# Patient Record
Sex: Female | Born: 1950
Health system: Southern US, Community
[De-identification: ages and names within clinical notes are randomized; demographics above are authoritative.]

## PROBLEM LIST (undated history)

## (undated) DIAGNOSIS — Z9484 Stem cells transplant status: Secondary | ICD-10-CM

## (undated) DIAGNOSIS — J069 Acute upper respiratory infection, unspecified: Secondary | ICD-10-CM

## (undated) DIAGNOSIS — I341 Nonrheumatic mitral (valve) prolapse: Secondary | ICD-10-CM

## (undated) DIAGNOSIS — C9000 Multiple myeloma not having achieved remission: Secondary | ICD-10-CM

## (undated) DIAGNOSIS — K5909 Other constipation: Secondary | ICD-10-CM

## (undated) DIAGNOSIS — N83201 Unspecified ovarian cyst, right side: Secondary | ICD-10-CM

## (undated) HISTORY — DX: Acute upper respiratory infection, unspecified: J06.9

## (undated) HISTORY — DX: Stem cells transplant status: Z94.84

## (undated) HISTORY — DX: Unspecified ovarian cyst, right side: N83.201

## (undated) HISTORY — DX: Other constipation: K59.09

## (undated) HISTORY — DX: Multiple myeloma not having achieved remission: C90.00

## (undated) HISTORY — DX: Nonrheumatic mitral (valve) prolapse: I34.1

---

## 1988-08-09 HISTORY — PX: LASER ABLATION OF CONDYLOMAS: SHX1948

## 1999-02-19 ENCOUNTER — Other Ambulatory Visit: Admission: RE | Admit: 1999-02-19 | Discharge: 1999-02-19 | Payer: Self-pay | Admitting: Obstetrics and Gynecology

## 1999-09-24 ENCOUNTER — Other Ambulatory Visit: Admission: RE | Admit: 1999-09-24 | Discharge: 1999-09-24 | Payer: Self-pay | Admitting: Obstetrics and Gynecology

## 2000-10-07 ENCOUNTER — Other Ambulatory Visit: Admission: RE | Admit: 2000-10-07 | Discharge: 2000-10-07 | Payer: Self-pay | Admitting: Obstetrics and Gynecology

## 2001-11-06 ENCOUNTER — Other Ambulatory Visit: Admission: RE | Admit: 2001-11-06 | Discharge: 2001-11-06 | Payer: Self-pay | Admitting: Obstetrics and Gynecology

## 2002-11-08 ENCOUNTER — Other Ambulatory Visit: Admission: RE | Admit: 2002-11-08 | Discharge: 2002-11-08 | Payer: Self-pay | Admitting: Obstetrics and Gynecology

## 2003-03-06 ENCOUNTER — Encounter: Payer: Self-pay | Admitting: Internal Medicine

## 2003-03-06 ENCOUNTER — Encounter: Admission: RE | Admit: 2003-03-06 | Discharge: 2003-03-06 | Payer: Self-pay | Admitting: Internal Medicine

## 2003-04-10 ENCOUNTER — Encounter (INDEPENDENT_AMBULATORY_CARE_PROVIDER_SITE_OTHER): Payer: Self-pay | Admitting: Specialist

## 2003-04-10 ENCOUNTER — Ambulatory Visit (HOSPITAL_COMMUNITY): Admission: RE | Admit: 2003-04-10 | Discharge: 2003-04-10 | Payer: Self-pay | Admitting: Gastroenterology

## 2003-12-13 ENCOUNTER — Encounter: Admission: RE | Admit: 2003-12-13 | Discharge: 2003-12-13 | Payer: Self-pay | Admitting: Internal Medicine

## 2003-12-17 ENCOUNTER — Other Ambulatory Visit: Admission: RE | Admit: 2003-12-17 | Discharge: 2003-12-17 | Payer: Self-pay | Admitting: Obstetrics and Gynecology

## 2005-01-06 ENCOUNTER — Other Ambulatory Visit: Admission: RE | Admit: 2005-01-06 | Discharge: 2005-01-06 | Payer: Self-pay | Admitting: Obstetrics and Gynecology

## 2006-01-12 ENCOUNTER — Other Ambulatory Visit: Admission: RE | Admit: 2006-01-12 | Discharge: 2006-01-12 | Payer: Self-pay | Admitting: Obstetrics & Gynecology

## 2007-01-23 ENCOUNTER — Other Ambulatory Visit: Admission: RE | Admit: 2007-01-23 | Discharge: 2007-01-23 | Payer: Self-pay | Admitting: Obstetrics and Gynecology

## 2008-01-26 ENCOUNTER — Other Ambulatory Visit: Admission: RE | Admit: 2008-01-26 | Discharge: 2008-01-26 | Payer: Self-pay | Admitting: Obstetrics and Gynecology

## 2010-10-08 HISTORY — PX: CATARACT EXTRACTION, BILATERAL: SHX1313

## 2010-12-25 NOTE — Op Note (Signed)
NAME:  Jessica [name], Jessica [name]                          ACCOUNT NO.:  [id number]   MEDICAL RECORD NO.:  [id number]                   PATIENT TYPE:  AMB   LOCATION:  ENDO                                 FACILITY:  [facility]   PHYSICIAN:  Danise Edge, M.D.                DATE OF BIRTH:  [birth year]   DATE OF PROCEDURE:  04/10/2003  DATE OF DISCHARGE:                                 OPERATIVE REPORT   PROCEDURES:  1. Esophagogastroduodenoscopy.  2. Colonoscopy.  3. Cecal polypectomy.   PROCEDURE INDICATION:  [name] is a 60 year old female, born 10/13/1950.  Ms. Livingstone underwent an upper GI x-ray series which revealed a  possible small polyp in the fundus of her stomach.  She is also due for her  first screening colonoscopy with polypectomy to prevent colon cancer.   ENDOSCOPIST:  Danise Edge, M.D.   PREMEDICATION:  1. Versed 10 mg.  2. Demerol 70 mg.   DIAGNOSTIC ESOPHAGOGASTRODUODENOSCOPY:  After obtaining informed consent,  [name] was placed in the left lateral decubitus position.  I administered  intravenous Demerol and intravenous Versed to achieve conscious sedation for  the procedure.  The patient's blood pressure, oxygen saturation, and cardiac  rhythm were monitored throughout the procedure and documented in the medical  record.   The Olympus gastroscope was passed through the posterior hypopharynx into  the proximal esophagus without difficulty.  The hypopharynx, larynx, and  vocal cords appeared normal.   ESOPHAGOSCOPY:  The proximal, mid, and lower segments of the esophageal  mucosa appear normal endoscopically.   GASTROSCOPY:  Retroflexed view of the gastric cardia and fundus was  completely normal.  Close inspection does not disclose a polyp at the  esophagogastric junction, in the gastric cardia, or in the gastric fundus.  The gastric body, antrum, and pylorus appear completely normal.   DUODENOSCOPY:  The duodenal bulb and descending duodenum appear  normal.   ASSESSMENT:  Normal esophagogastroduodenoscopy.   PROCTOCOLONOSCOPY WITH POLYPECTOMY:  Anal inspection was normal.  Digital  rectal exam was normal.  The Olympus pediatric adjustable colonoscope was  introduced into the rectum and advanced to the cecum.  Colonic preparation  for the exam today was excellent.   RECTUM:  Normal.  SIGMOID COLON AND DESCENDING COLON:  Normal.  SPLENIC FLEXURE:  Normal.  TRANSVERSE COLON:  Normal.  HEPATIC FLEXURE:  Normal.  ASCENDING COLON:  Normal.  CECUM AND ILEOCECAL VALVE:  From the proximal cecum adjacent to the  appendiceal orifice, there is a carpet-like 1 cm polyp.  The polyp was  lifted by saline mucosal injection and removed with the electrocautery snare  in piecemeal fashion.  Pieces of the polyp were retrieved for pathologic  evaluation.   ASSESSMENT:  A 1 cm flat polyp was removed from the proximal cecum adjacent  to the appendiceal orifice; otherwise normal proctocolonoscopy to the cecum.  Danise Edge, M.D.    MJ/MEDQ  D:  04/10/2003  T:  04/10/2003  Job:  [id number]

## 2013-03-02 ENCOUNTER — Ambulatory Visit: Payer: Self-pay | Admitting: Obstetrics and Gynecology

## 2013-03-19 ENCOUNTER — Encounter: Payer: Self-pay | Admitting: Obstetrics and Gynecology

## 2013-03-19 ENCOUNTER — Ambulatory Visit (INDEPENDENT_AMBULATORY_CARE_PROVIDER_SITE_OTHER): Payer: 59 | Admitting: Obstetrics and Gynecology

## 2013-03-19 ENCOUNTER — Ambulatory Visit: Payer: Self-pay | Admitting: Obstetrics and Gynecology

## 2013-03-19 VITALS — BP 152/90 | HR 84 | Resp 18 | Ht 61.0 in | Wt 135.0 lb

## 2013-03-19 DIAGNOSIS — Z0000 Encounter for general adult medical examination without abnormal findings: Secondary | ICD-10-CM

## 2013-03-19 DIAGNOSIS — Z01419 Encounter for gynecological examination (general) (routine) without abnormal findings: Secondary | ICD-10-CM

## 2013-03-19 LAB — POCT URINALYSIS DIPSTICK
Bilirubin, UA: NEGATIVE
Blood, UA: NEGATIVE
Glucose, UA: NEGATIVE
Ketones, UA: NEGATIVE
Leukocytes, UA: NEGATIVE
Nitrite, UA: NEGATIVE
Protein, UA: NEGATIVE
Urobilinogen, UA: NEGATIVE
pH, UA: 5

## 2013-03-19 LAB — HEMOGLOBIN, FINGERSTICK: Hemoglobin, fingerstick: 13.6 g/dL (ref 12.0–16.0)

## 2013-03-19 NOTE — Progress Notes (Signed)
03/12/13 normal62 y.o.   Married    Caucasian   female   G1P1   here for annual exam.    Patient's last menstrual period was 08/10/2003.          Sexually active: no  The current method of family planning is vasectomy and post menopausal status.    Exercising: occ walking Last mammogram: 03/12/13 normal  Last pap smear:02/19/11 History of abnormal pap: 1990 CIN 1 (cervix) Smoking: no Alcohol:no Last colonoscopy:07/2011 normal, repeat in 5 years Last Bone Density:  02/27/09 normal Last tetanus shot: 2007 Last cholesterol check: no sure  Hgb:  13.6              Urine: neg   Family History  Problem Relation Age of Onset  . Osteoporosis Mother     There are no active problems to display for this patient.   Past Medical History  Diagnosis Date  . MVP (mitral valve prolapse)     req prophylaxis    Past Surgical History  Procedure Laterality Date  . Laser ablation of condylomas  1990  . Cataract extraction, bilateral  10/2010    Implants(ReSTOR) Bilat    Allergies: Review of patient's allergies indicates no known allergies.  Current Outpatient Prescriptions  Medication Sig Dispense Refill  . Calcium Carbonate-Vit D-Min (CALTRATE 600+D PLUS PO) Take by mouth 2 (two) times daily.      . Multiple Vitamins-Minerals (CENTRUM SILVER PO) Take by mouth daily.      . Vitamin D, Ergocalciferol, (DRISDOL) 50000 UNITS CAPS capsule Take 50,000 Units by mouth every 14 (fourteen) days.       No current facility-administered medications for this visit.    ROS: Pertinent items are noted in HPI.  Social Hx: Married, one child, retired from working at [employer] which got bought out  Exam:    BP 152/90  Pulse 84  Resp 18  Ht 5\' 1"  (1.549 m)  Wt 135 lb (61.236 kg)  BMI 25.52 kg/m2  LMP 01/01/2005ht stable and wt up 6 pounds from last year   Wt Readings from Last 3 Encounters:  03/19/13 135 lb (61.236 kg)     Ht Readings from Last 3 Encounters:  03/19/13 5\' 1"  (1.549 m)     General appearance: alert, cooperative and appears stated age Head: Normocephalic, without obvious abnormality, atraumatic Neck: no adenopathy, supple, symmetrical, trachea midline and thyroid not enlarged, symmetric, no tenderness/mass/nodules Lungs: clear to auscultation bilaterally Breasts: Inspection negative, No nipple retraction or dimpling, No nipple discharge or bleeding, No axillary or supraclavicular adenopathy, Normal to palpation without dominant masses Heart: regular rate and rhythm Abdomen: soft, non-tender; bowel sounds normal; no masses,  no organomegaly Extremities: extremities normal, atraumatic, no cyanosis or edema Skin: Skin color, texture, turgor normal. No rashes or lesions Lymph nodes: Cervical, supraclavicular, and axillary nodes normal. No abnormal inguinal nodes palpated Neurologic: Grossly normal   Pelvic: External genitalia:  no lesions              Urethra:  normal appearing urethra with no masses, tenderness or lesions              Bartholins and Skenes: normal                 Vagina: normal appearing vagina with normal color and discharge, no lesions              Cervix: normal appearance  Pap taken: no        Bimanual Exam:  Uterus:  uterus is normal size, shape, consistency and nontender, mid, mobile                                      Adnexa: normal adnexa in size, nontender and no masses                                      Rectovaginal: Confirms                                      Anus:  normal sphincter tone, no lesions  A: normal menopausal exam, no HRT     P:     mammogram counseled on breast self exam, mammography screening, adequate intake of calcium and vitamin D, diet and exercise return annually or prn   Monitor BP at home  (pt has a cuff at home) and report it to Dr. Earl Gala   An After Visit Summary was printed and given to the patient.

## 2013-03-19 NOTE — Patient Instructions (Signed)

## 2013-04-03 ENCOUNTER — Telehealth: Payer: Self-pay | Admitting: Obstetrics and Gynecology

## 2013-04-03 NOTE — Telephone Encounter (Signed)
She should take 2000 U daily OTC Vit D.  Thanks.

## 2013-04-03 NOTE — Telephone Encounter (Signed)
Pt's last Vit D level checked 02-2010 was 51. None on file or in Epic since. Pt has been taking Vit D 50000 every other week. Does she need a Vit D level now and then decide whether to use Rx or OTC dose? Chart in your cabinet.

## 2013-04-03 NOTE — Telephone Encounter (Signed)
Patient has been taking Vit D 2 , 2 a month . Prescription has ran out does she  need to continue taking/ if so can you please call in a refill.

## 2013-04-03 NOTE — Telephone Encounter (Signed)
Spoke with pt and advised she may take OTC dose 2000 IU of Vit D daily. No need for Rx for larger dose. Pt appreciative.

## 2013-09-27 ENCOUNTER — Other Ambulatory Visit: Payer: Self-pay | Admitting: Dermatology

## 2013-11-22 ENCOUNTER — Other Ambulatory Visit: Payer: Self-pay | Admitting: Dermatology

## 2014-03-25 ENCOUNTER — Ambulatory Visit (INDEPENDENT_AMBULATORY_CARE_PROVIDER_SITE_OTHER): Payer: 59 | Admitting: Obstetrics and Gynecology

## 2014-03-25 ENCOUNTER — Encounter: Payer: Self-pay | Admitting: Obstetrics and Gynecology

## 2014-03-25 VITALS — BP 150/96 | HR 100 | Resp 18 | Ht 61.0 in | Wt 136.0 lb

## 2014-03-25 DIAGNOSIS — Z01419 Encounter for gynecological examination (general) (routine) without abnormal findings: Secondary | ICD-10-CM

## 2014-03-25 NOTE — Patient Instructions (Signed)

## 2014-03-25 NOTE — Progress Notes (Signed)
Patient ID: Jessica [name], female   DOB: [birth year], 63 y.o.   MRN: [id number] GYNECOLOGY VISIT  PCP:   Nehemiah Settle, MD  Referring provider:   HPI: 63 y.o.   Married  Caucasian  female   G1P1 with Patient's last menstrual period was 08/10/2003.   here for  AEX.   Will see Dr. Maxwell Caul tomorrow.  Has been having some back pain.  Will do labs there.  Elevated blood pressure today.  States she has white coat syndrome.  Takes blood pressure at home, where it is 130s over 70s.  Can vary.  Will bring readings to PCP tomorrow.   Hgb:    PCP Urine:  PCP  GYNECOLOGIC HISTORY: Patient's last menstrual period was 08/10/2003. Sexually active:  Yes, Occasionally Partner preference: female Contraception:  Vasectomy/postmenopausal  Menopausal hormone therapy: no DES exposure:   no Blood transfusions: no   Sexually transmitted diseases:  no  GYN procedures and prior surgeries:  Laser of cervix for abnormal pap and condyloma 1990 Last mammogram:  03-18-14 ERX:[id number]               Last pap and high risk HPV testing: 02-19-11 wnl   History of abnormal pap smear:  1990 CIN I cervix and had Laser   OB History   Grav Para Term Preterm Abortions TAB SAB Ect Mult Living   1 1        1        LIFESTYLE: Exercise: no               OTHER HEALTH MAINTENANCE: Tetanus/TDap:  Up to date with PCP HPV:                  N/a Influenza:          05/2013   Bone density:   02-27-09 normal Colonoscopy:  07/2011 normal with Dr. Earle Gell.  Next one due 07/2016.  Cholesterol check: several years QPY:[id number]  Family History  Problem Relation Age of Onset  . Osteoporosis Mother     There are no active problems to display for this patient.  Past Medical History  Diagnosis Date  . MVP (mitral valve prolapse)     req prophylaxis    Past Surgical History  Procedure Laterality Date  . Laser ablation of condylomas  1990    CIN 1 cervix  . Cataract extraction, bilateral  10/2010     Implants(ReSTOR) Bilat    ALLERGIES: Codeine  Current Outpatient Prescriptions  Medication Sig Dispense Refill  . Calcium Carbonate-Vit D-Min (CALTRATE 600+D PLUS PO) Take by mouth 2 (two) times daily.      . Cholecalciferol (VITAMIN D3) 2000 UNITS TABS Take 2,000 Units by mouth every 14 (fourteen) days.      . Multiple Vitamins-Minerals (CENTRUM SILVER PO) Take by mouth daily.       No current facility-administered medications for this visit.     ROS:  Pertinent items are noted in HPI.  History   Social History  . Marital Status: Married    Spouse Name: N/A    Number of Children: N/A  . Years of Education: N/A   Occupational History  . Not on file.   Social History Main Topics  . Smoking status: Never Smoker   . Smokeless tobacco: Never Used  . Alcohol Use: No  . Drug Use: No  . Sexual Activity: Not Currently    Partners: Male    Birth Control/ Protection: Post-menopausal  Comment: vasectomy   Other Topics Concern  . Not on file   Social History Narrative  . No narrative on file    PHYSICAL EXAMINATION:    BP 150/96  Pulse 100  Resp 18  Ht 5\' 1"  (1.549 m)  Wt 136 lb (61.689 kg)  BMI 25.71 kg/m2  LMP 08/10/2003   Wt Readings from Last 3 Encounters:  03/25/14 136 lb (61.689 kg)  03/19/13 135 lb (61.236 kg)     Ht Readings from Last 3 Encounters:  03/25/14 5\' 1"  (1.549 m)  03/19/13 5\' 1"  (1.549 m)    General appearance: alert, cooperative and appears stated age Head: Normocephalic, without obvious abnormality, atraumatic Neck: no adenopathy, supple, symmetrical, trachea midline and thyroid not enlarged, symmetric, no tenderness/mass/nodules Lungs: clear to auscultation bilaterally Breasts: Inspection negative, No nipple retraction or dimpling, No nipple discharge or bleeding, No axillary or supraclavicular adenopathy, Normal to palpation without dominant masses Heart: regular rate and rhythm Abdomen: soft, non-tender; no masses,  no  organomegaly Extremities: extremities normal, atraumatic, no cyanosis or edema Skin: Skin color, texture, turgor normal. No rashes or lesions Lymph nodes: Cervical, supraclavicular, and axillary nodes normal. No abnormal inguinal nodes palpated Neurologic: Grossly normal  Pelvic: External genitalia:  no lesions              Urethra:  normal appearing urethra with no masses, tenderness or lesions              Bartholins and Skenes: normal                 Vagina: normal appearing vagina with normal color and discharge, no lesions              Cervix: normal appearance              Pap and high risk HPV testing done: Yes.          Bimanual Exam:  Uterus:  uterus is normal size, shape, consistency and nontender                                      Adnexa: normal adnexa in size, nontender and no masses                                      Rectovaginal:  Yes.                                        Confirms above.                                      Anus:  normal sphincter tone, no lesions  ASSESSMENT  Normal gynecologic exam. Elevated blood pressure.   PLAN  Mammogram recommended yearly starting at age 70. Pap smear and high risk HPV testing as above. Counseled on self breast exam, Calcium and vitamin D intake, exercise. See lab orders: No. Follow up with PCP for elevated blood pressure and routine lab work to be done.  Return annually or prn   An After Visit Summary was printed and given to the patient.

## 2014-03-27 LAB — IPS PAP TEST WITH HPV

## 2014-06-05 ENCOUNTER — Other Ambulatory Visit (HOSPITAL_COMMUNITY): Payer: Self-pay | Admitting: Internal Medicine

## 2014-06-05 DIAGNOSIS — H539 Unspecified visual disturbance: Secondary | ICD-10-CM

## 2014-06-06 ENCOUNTER — Ambulatory Visit (HOSPITAL_COMMUNITY)
Admission: RE | Admit: 2014-06-06 | Discharge: 2014-06-06 | Disposition: A | Discharge status: Home / Self Care | Payer: 59 | Source: Ambulatory Visit | Attending: Internal Medicine | Admitting: Internal Medicine

## 2014-06-06 DIAGNOSIS — H539 Unspecified visual disturbance: Secondary | ICD-10-CM

## 2014-06-06 DIAGNOSIS — H532 Diplopia: Secondary | ICD-10-CM | POA: Diagnosis not present

## 2014-06-06 DIAGNOSIS — R51 Headache: Secondary | ICD-10-CM | POA: Diagnosis not present

## 2014-06-07 ENCOUNTER — Other Ambulatory Visit: Payer: Self-pay | Admitting: Internal Medicine

## 2014-06-07 DIAGNOSIS — G939 Disorder of brain, unspecified: Secondary | ICD-10-CM

## 2014-06-10 ENCOUNTER — Ambulatory Visit
Admission: RE | Admit: 2014-06-10 | Discharge: 2014-06-10 | Disposition: A | Discharge status: Home / Self Care | Payer: 59 | Source: Ambulatory Visit | Attending: Internal Medicine | Admitting: Internal Medicine

## 2014-06-10 ENCOUNTER — Encounter: Payer: Self-pay | Admitting: Obstetrics and Gynecology

## 2014-06-10 DIAGNOSIS — G939 Disorder of brain, unspecified: Secondary | ICD-10-CM

## 2014-06-10 MED ORDER — GADOBENATE DIMEGLUMINE 529 MG/ML IV SOLN
12.0000 mL | Freq: Once | INTRAVENOUS | Status: AC | PRN
  Administered 2014-06-10: 12 mL via INTRAVENOUS

## 2014-06-19 ENCOUNTER — Other Ambulatory Visit: Payer: Self-pay | Admitting: Neurosurgery

## 2014-06-19 DIAGNOSIS — D329 Benign neoplasm of meninges, unspecified: Secondary | ICD-10-CM

## 2014-06-20 ENCOUNTER — Ambulatory Visit
Admission: RE | Admit: 2014-06-20 | Discharge: 2014-06-20 | Disposition: A | Discharge status: Home / Self Care | Payer: 59 | Source: Ambulatory Visit | Attending: Neurosurgery | Admitting: Neurosurgery

## 2014-06-20 DIAGNOSIS — D329 Benign neoplasm of meninges, unspecified: Secondary | ICD-10-CM

## 2014-08-09 HISTORY — PX: OTHER SURGICAL HISTORY: SHX169

## 2014-11-14 ENCOUNTER — Telehealth: Payer: Self-pay | Admitting: Hematology and Oncology

## 2014-11-14 NOTE — Telephone Encounter (Signed)
S/W PATIENT AND GAVE NP APPT FOR 04/12 @ 12 W/DR. GORSUCH, CHEMO EDU @ 9:30

## 2014-11-18 ENCOUNTER — Other Ambulatory Visit: Payer: Self-pay | Admitting: *Deleted

## 2014-11-19 ENCOUNTER — Telehealth: Payer: Self-pay | Admitting: Hematology and Oncology

## 2014-11-19 ENCOUNTER — Encounter: Payer: Self-pay | Admitting: Hematology and Oncology

## 2014-11-19 ENCOUNTER — Ambulatory Visit (HOSPITAL_BASED_OUTPATIENT_CLINIC_OR_DEPARTMENT_OTHER): Payer: Medicare Other | Admitting: Hematology and Oncology

## 2014-11-19 ENCOUNTER — Ambulatory Visit: Payer: Medicare Other

## 2014-11-19 ENCOUNTER — Encounter: Payer: Self-pay | Admitting: *Deleted

## 2014-11-19 ENCOUNTER — Other Ambulatory Visit: Payer: Self-pay | Admitting: Hematology and Oncology

## 2014-11-19 ENCOUNTER — Other Ambulatory Visit: Payer: Medicare Other

## 2014-11-19 ENCOUNTER — Other Ambulatory Visit (HOSPITAL_BASED_OUTPATIENT_CLINIC_OR_DEPARTMENT_OTHER): Payer: Medicare Other

## 2014-11-19 ENCOUNTER — Telehealth: Payer: Self-pay | Admitting: *Deleted

## 2014-11-19 VITALS — BP 101/56 | HR 97 | Temp 98.5°F | Resp 18 | Ht 61.0 in | Wt 130.6 lb

## 2014-11-19 DIAGNOSIS — C9000 Multiple myeloma not having achieved remission: Secondary | ICD-10-CM | POA: Diagnosis not present

## 2014-11-19 DIAGNOSIS — N179 Acute kidney failure, unspecified: Secondary | ICD-10-CM

## 2014-11-19 DIAGNOSIS — G5792 Unspecified mononeuropathy of left lower limb: Secondary | ICD-10-CM

## 2014-11-19 DIAGNOSIS — C9002 Multiple myeloma in relapse: Secondary | ICD-10-CM | POA: Insufficient documentation

## 2014-11-19 DIAGNOSIS — E2749 Other adrenocortical insufficiency: Secondary | ICD-10-CM

## 2014-11-19 DIAGNOSIS — N19 Unspecified kidney failure: Secondary | ICD-10-CM | POA: Insufficient documentation

## 2014-11-19 DIAGNOSIS — M792 Neuralgia and neuritis, unspecified: Secondary | ICD-10-CM | POA: Insufficient documentation

## 2014-11-19 DIAGNOSIS — R1111 Vomiting without nausea: Secondary | ICD-10-CM

## 2014-11-19 DIAGNOSIS — C9001 Multiple myeloma in remission: Secondary | ICD-10-CM | POA: Insufficient documentation

## 2014-11-19 DIAGNOSIS — T380X5A Adverse effect of glucocorticoids and synthetic analogues, initial encounter: Secondary | ICD-10-CM

## 2014-11-19 HISTORY — DX: Multiple myeloma not having achieved remission: C90.00

## 2014-11-19 LAB — CBC WITH DIFFERENTIAL/PLATELET
BASO%: 0.5 % (ref 0.0–2.0)
Basophils Absolute: 0 10*3/uL (ref 0.0–0.1)
EOS%: 0.3 % (ref 0.0–7.0)
Eosinophils Absolute: 0 10*3/uL (ref 0.0–0.5)
HCT: 34.6 % — ABNORMAL LOW (ref 34.8–46.6)
HGB: 11.2 g/dL — ABNORMAL LOW (ref 11.6–15.9)
LYMPH%: 22.9 % (ref 14.0–49.7)
MCH: 29.3 pg (ref 25.1–34.0)
MCHC: 32.4 g/dL (ref 31.5–36.0)
MCV: 90.6 fL (ref 79.5–101.0)
MONO#: 0.6 10*3/uL (ref 0.1–0.9)
MONO%: 10.8 % (ref 0.0–14.0)
NEUT#: 3.8 10*3/uL (ref 1.5–6.5)
NEUT%: 65.5 % (ref 38.4–76.8)
Platelets: 332 10*3/uL (ref 145–400)
RBC: 3.82 10*6/uL (ref 3.70–5.45)
RDW: 13.4 % (ref 11.2–14.5)
WBC: 5.9 10*3/uL (ref 3.9–10.3)
lymph#: 1.3 10*3/uL (ref 0.9–3.3)

## 2014-11-19 LAB — LACTATE DEHYDROGENASE (CC13): LDH: 213 U/L (ref 125–245)

## 2014-11-19 LAB — COMPREHENSIVE METABOLIC PANEL (CC13)
ALT: 48 U/L (ref 0–55)
AST: 44 U/L — ABNORMAL HIGH (ref 5–34)
Albumin: 3.6 g/dL (ref 3.5–5.0)
Alkaline Phosphatase: 155 U/L — ABNORMAL HIGH (ref 40–150)
Anion Gap: 6 mEq/L (ref 3–11)
BUN: 11.6 mg/dL (ref 7.0–26.0)
CO2: 27 mEq/L (ref 22–29)
Calcium: 10.1 mg/dL (ref 8.4–10.4)
Chloride: 105 mEq/L (ref 98–109)
Creatinine: 1.5 mg/dL — ABNORMAL HIGH (ref 0.6–1.1)
EGFR: 37 mL/min/{1.73_m2} — ABNORMAL LOW (ref >90)
Glucose: 100 mg/dl (ref 70–140)
Potassium: 3.8 mEq/L (ref 3.5–5.1)
Sodium: 139 mEq/L (ref 136–145)
Total Bilirubin: 0.35 mg/dL (ref 0.20–1.20)
Total Protein: 8.2 g/dL (ref 6.4–8.3)

## 2014-11-19 LAB — URIC ACID (CC13): Uric Acid, Serum: 9.2 mg/dl — ABNORMAL HIGH (ref 2.6–7.4)

## 2014-11-19 MED ORDER — ACYCLOVIR 400 MG PO TABS: 400.0000 mg | ORAL_TABLET | Freq: Two times a day (BID) | ORAL | Status: DC

## 2014-11-19 MED ORDER — MORPHINE SULFATE ER 15 MG PO TBCR: 15.0000 mg | EXTENDED_RELEASE_TABLET | Freq: Two times a day (BID) | ORAL | Status: DC

## 2014-11-19 MED ORDER — OXYCODONE HCL 5 MG PO CAPS: 5.0000 mg | ORAL_CAPSULE | ORAL | Status: DC | PRN

## 2014-11-19 MED ORDER — DEXAMETHASONE 4 MG PO TABS: 20.0000 mg | ORAL_TABLET | ORAL | Status: DC

## 2014-11-19 MED ORDER — PROCHLORPERAZINE MALEATE 10 MG PO TABS: 10.0000 mg | ORAL_TABLET | Freq: Four times a day (QID) | ORAL | Status: DC | PRN

## 2014-11-19 NOTE — Progress Notes (Signed)
[facility] NOTE  Patient Care Team: Carlena Sax, MD as PCP - General (Internal Medicine)  CHIEF COMPLAINTS/PURPOSE OF CONSULTATION:  Multiple myeloma, IgG subtype  HISTORY OF PRESENTING ILLNESS:  Jessica [name] 64 y.o. female is here because of recent diagnosis of multiple myeloma. I reviewed her records extensively and color related the history with the patient. The symptoms began with inability to open her right eye. She has extensive evaluation and underwent surgery and was subsequently diagnosed with plasmacytoma. She was treated at [facility]. She had received radiation therapy to the right sphenoid area and her femur to prevent pathological fracture. The patient was successfully taper of dexamethasone a month ago. She was noted to have rising M spike and decision is to start her on treatment. PET CT scan in the outside facility show multiple bone lesions.  Oncology History    M-protein 0.69 gm/dl IFIX - IgG, Kappa IgG - 868 IgA - 19 IgM - < 20 Kappa - 21 Lambda - 5.7  09/06/2014 - Bone marrow aspirate and biopsy:   Normocellular marrow for age (40%) with a small monoclonal plasma cell population (1% on aspirate). Karyotype 22, XX  FISH Negative for myeloma associated changes  09/12/2014 - PET/CT  Two regions that are concerning for disease, one adjacent/involving the left ninth rib and one in the marrow of the right femur, in this patient with history of plasmacytoma.      Multiple myeloma   06/10/2014 Imaging MRI brain showed tumor filling the cavernous sinus on the right measuring approximately 2.6 x 1.4 x 1.9 cm, most consistent with meningioma.There is encasement of the internal carotid artery, extension into the orbital apex, medial sella, and sphenoid    08/21/2014 Surgery  she underwent orbital craniectomy and pathology is consistent for plasmacytoma   09/06/2014 Bone Marrow Biopsy BM performed at [facility] is not  consistent with multiple myeloma, 1% plasma cell on aspirate   09/12/2014 Imaging  PET CT scan show involvement of left ninth rib and right femur   09/23/2014 - 10/23/2014 Radiation Therapy  she had radiation therapy to the cavernous sinus and skull base lesions, 45 Gy   10/21/2014 - 11/01/2014 Radiation Therapy  she had radiation to right femur , total 30 Gy    Her current symptoms consists of left flank pain which is described as burning sensation, rated as 6 out of 10 pain. She is not taking oxycodone as directed for fear of being dependent on pain medicine. She also has nausea, significant lack of energy and fatigue since her treatment was discontinued a month ago.  MEDICAL HISTORY:  Past Medical History  Diagnosis Date  . MVP (mitral valve prolapse)     req prophylaxis  . Multiple myeloma 11/19/2014    SURGICAL HISTORY: Past Surgical History  Procedure Laterality Date  . Laser ablation of condylomas  1990    CIN 1 cervix  . Cataract extraction, bilateral  10/2010    Implants(ReSTOR) Bilat    SOCIAL HISTORY: History   Social History  . Marital Status: Married    Spouse Name: N/A  . Number of Children: N/A  . Years of Education: N/A   Occupational History  . Not on file.   Social History Main Topics  . Smoking status: Never Smoker   . Smokeless tobacco: Never Used  . Alcohol Use: No  . Drug Use: No  . Sexual Activity:    Partners: Male    Birth Control/  Protection: Post-menopausal     Comment: vasectomy   Other Topics Concern  . Not on file   Social History Narrative    FAMILY HISTORY: Family History  Problem Relation Age of Onset  . Osteoporosis Mother     ALLERGIES:  is allergic to codeine and ibuprofen.  MEDICATIONS:  Current Outpatient Prescriptions  Medication Sig Dispense Refill  . lisinopril-hydrochlorothiazide (PRINZIDE,ZESTORETIC) 20-12.5 MG per tablet Take 1 tablet by mouth daily.    . ondansetron (ZOFRAN) 8 MG tablet Take 8 mg by mouth every 8  (eight) hours as needed for nausea or vomiting.    Marland Kitchen oxycodone (OXY-IR) 5 MG capsule Take 1 capsule (5 mg total) by mouth every 4 (four) hours as needed. 60 capsule 0  . acyclovir (ZOVIRAX) 400 MG tablet Take 1 tablet (400 mg total) by mouth 2 (two) times daily. 60 tablet 3  . dexamethasone (DECADRON) 4 MG tablet Take 5 tablets (20 mg total) by mouth once a week. 80 tablet 3  . morphine (MS CONTIN) 15 MG 12 hr tablet Take 1 tablet (15 mg total) by mouth every 12 (twelve) hours. 60 tablet 0  . prochlorperazine (COMPAZINE) 10 MG tablet Take 1 tablet (10 mg total) by mouth every 6 (six) hours as needed (Nausea or vomiting). 30 tablet 1   No current facility-administered medications for this visit.    REVIEW OF SYSTEMS:   Eyes: Denies blurriness of vision, double vision or watery eyes Ears, nose, mouth, throat, and face: Denies mucositis or sore throat Respiratory: Denies cough, dyspnea or wheezes Cardiovascular: Denies palpitation, chest discomfort or lower extremity swelling Skin: Denies abnormal skin rashes Lymphatics: Denies new lymphadenopathy or easy bruising Neurological:Denies numbness, tingling or new weaknesses Behavioral/Psych: Mood is stable, no new changes  All other systems were reviewed with the patient and are negative.  PHYSICAL EXAMINATION: ECOG PERFORMANCE STATUS: 1 - Symptomatic but completely ambulatory  Filed Vitals:   11/19/14 1151  BP: 101/56  Pulse: 97  Temp: 98.5 F (36.9 C)  Resp: 18   Filed Weights   11/19/14 1151  Weight: 130 lb 9.6 oz (59.24 kg)    GENERAL:alert, no distress and comfortable. she is mildly cushingoid SKIN: skin color, texture, turgor are normal, no rashes or significant lesions EYES: normal, conjunctiva are pink and non-injected, sclera clear OROPHARYNX:no exudate, no erythema and lips, buccal mucosa, and tongue normal  NECK: supple, thyroid normal size, non-tender, without nodularity LYMPH:  no palpable lymphadenopathy in the  cervical, axillary or inguinal LUNGS: clear to auscultation and percussion with normal breathing effort HEART: regular rate & rhythm and no murmurs and no lower extremity edema ABDOMEN:abdomen soft, non-tender and normal bowel sounds Musculoskeletal:no cyanosis of digits and no clubbing  PSYCH: alert & oriented x 3 with fluent speech NEURO: no focal motor/sensory deficits  LABORATORY DATA:  I have reviewed the data as listed Lab Results  Component Value Date   WBC 5.9 11/19/2014   HGB 11.2* 11/19/2014   HCT 34.6* 11/19/2014   MCV 90.6 11/19/2014   PLT 332 11/19/2014   ASSESSMENT & PLAN:  Multiple myeloma We discussed the role of chemotherapy. The intent is for palliative.  We discussed some of the risks, benefits, side-effects of Revlimid, Bortezemib and Dexamethasone with Zometa  Some of the short term side-effects included, though not limited to, risk of fatigue, risk of allergic reactions, mouth sores, weight loss, pancytopenia, life-threatening infections, need for transfusions of blood products, nausea, vomiting, change in bowel habits, blood clots, admission to hospital  for various reasons, and risks of death.   Long term side-effects are also discussed including risks of infertility, permanent damage to nerve function, chronic fatigue, and rare secondary malignancy including bone marrow disorders and leukemia.   The patient is aware that the response rates discussed earlier is not guaranteed.  After a long discussion, patient made an informed decision to proceed with the prescribed plan of care and went ahead to sign the consent form today.   Patient education materials were dispensed today  I plan on starting her treatment next week on 11/26/2014. I recommend she obtain dental clearance prior to Zometa. She will start vitamin D supplement. She will be started on aspirin for DVT prophylaxis and acyclovir again is viral infection.  I have refilled prescription anti-emetics.   I will see her prior to week 2 of therapy.  the patient is surprised at the mention of bone marrow transplant.  I felt that both her husband and the patient exhausted by information overload today and I did not go into much details about the long-term plan. I plan to give 4 cycles of induction chemotherapy.   Prerenal renal failure  This is likely related to dehydration from nausea. Recommend increased oral fluid intake and we will monitored carefully   Steroid withdrawal syndrome following proper administration The patient has vague symptoms of weakness, headaches, nausea and significant fatigue. Overall, I felt that she might have mild steroid withdrawal. I recommend she resume 2 mg daily dexamethasone for 1 week. Starting next week, she will begin pulse dexamethasone at 20 mg weekly.    Neuropathic pain of left flank She has mild neuropathic pain on the left flank. Clinically, there were no signs of shingles. Per outside report, she have multiple rib fractures and I suspect there could be a pinched nerve.  I recommend a trial of extended release morphine for short period of time.  Dexamethasone might help with this pain as well. In the future, if this pain persists, I might have to start her on other medications such chest gabapentin   Vomiting without nausea, not intractable  I suspect that the patient may have some nausea from steroid withdrawal. I also prescribed some antiemetics.     Orders Placed This Encounter  Procedures  . CBC with Differential    Standing Status: Standing     Number of Occurrences: 20     Standing Expiration Date: 11/20/2015  . Comprehensive metabolic panel    Standing Status: Standing     Number of Occurrences: 20     Standing Expiration Date: 11/20/2015    All questions were answered. The patient knows to call the clinic with any problems, questions or concerns. I spent 55 minutes counseling the patient face to face. The total time spent in  the appointment was 60 minutes and more than 50% was on counseling.     Thaxton, Stanton, MD 11/19/2014 4:18 PM   \

## 2014-11-19 NOTE — Telephone Encounter (Signed)
gave adn printed appt sched and avs for pt for April....sed added tx.

## 2014-11-19 NOTE — Progress Notes (Signed)
Checked in new pt with no financial concerns prior to seeing the dr.  Abbott [name] has my card for any billing questions or if financial assistance is needed in the future.

## 2014-11-19 NOTE — Telephone Encounter (Signed)
Instructed pt to increase fluid intake due to elevated kidney tests.  Informed she is dehydrated and try to drink two liters of fluid per day.  She states she is not surprised she is dehydrated d/t her nausea she has not been able to drink as much as she knows she should.  Her husband is getting new meds filled now including the compazine for nausea.  Instructed her to take every 6 hr as needed in addition to the zofran and call us if her nausea is not controlled.  She verbalized understanding.

## 2014-11-19 NOTE — Assessment & Plan Note (Signed)
We discussed the role of chemotherapy. The intent is for palliative.  We discussed some of the risks, benefits, side-effects of Revlimid, Bortezemib and Dexamethasone with Zometa  Some of the short term side-effects included, though not limited to, risk of fatigue, risk of allergic reactions, mouth sores, weight loss, pancytopenia, life-threatening infections, need for transfusions of blood products, nausea, vomiting, change in bowel habits, blood clots, admission to hospital for various reasons, and risks of death.   Long term side-effects are also discussed including risks of infertility, permanent damage to nerve function, chronic fatigue, and rare secondary malignancy including bone marrow disorders and leukemia.   The patient is aware that the response rates discussed earlier is not guaranteed.  After a long discussion, patient made an informed decision to proceed with the prescribed plan of care and went ahead to sign the consent form today.   Patient education materials were dispensed today  I plan on starting her treatment next week on 11/26/2014. I recommend she obtain dental clearance prior to Zometa. She will start vitamin D supplement. She will be started on aspirin for DVT prophylaxis and acyclovir again is viral infection.  I have refilled prescription anti-emetics.  I will see her prior to week 2 of therapy.  the patient is surprised at the mention of bone marrow transplant.  I felt that both her husband and the patient exhausted by information overload today and I did not go into much details about the long-term plan. I plan to give 4 cycles of induction chemotherapy.

## 2014-11-19 NOTE — Assessment & Plan Note (Signed)
I suspect that the patient may have some nausea from steroid withdrawal. I also prescribed some antiemetics.

## 2014-11-19 NOTE — Assessment & Plan Note (Signed)
This is likely related to dehydration from nausea. Recommend increased oral fluid intake and we will monitored carefully

## 2014-11-19 NOTE — Telephone Encounter (Signed)
-----   Message from [name], MD sent at 11/19/2014  2:11 PM EDT ----- Regarding: labs Pls call her and tell her she is not drinking enough liquids. Blood test showed creatinine a little high Try ginger ale and gatorade

## 2014-11-19 NOTE — Assessment & Plan Note (Signed)
She has mild neuropathic pain on the left flank. Clinically, there were no signs of shingles. Per outside report, she have multiple rib fractures and I suspect there could be a pinched nerve.  I recommend a trial of extended release morphine for short period of time.  Dexamethasone might help with this pain as well. In the future, if this pain persists, I might have to start her on other medications such chest gabapentin

## 2014-11-19 NOTE — Assessment & Plan Note (Signed)
The patient has vague symptoms of weakness, headaches, nausea and significant fatigue. Overall, I felt that she might have mild steroid withdrawal. I recommend she resume 2 mg daily dexamethasone for 1 week. Starting next week, she will begin pulse dexamethasone at 20 mg weekly.

## 2014-11-21 ENCOUNTER — Other Ambulatory Visit: Payer: Self-pay | Admitting: Hematology and Oncology

## 2014-11-21 ENCOUNTER — Other Ambulatory Visit: Payer: Self-pay | Admitting: *Deleted

## 2014-11-21 DIAGNOSIS — C9000 Multiple myeloma not having achieved remission: Secondary | ICD-10-CM

## 2014-11-21 LAB — SPEP & IFE WITH QIG
Abnormal Protein Band1: 1.6 g/dL
Albumin ELP: 4.1 g/dL (ref 3.8–4.8)
Alpha-1-Globulin: 0.4 g/dL — ABNORMAL HIGH (ref 0.2–0.3)
Alpha-2-Globulin: 1.1 g/dL — ABNORMAL HIGH (ref 0.5–0.9)
Beta 2: 0.3 g/dL (ref 0.2–0.5)
Beta Globulin: 0.4 g/dL (ref 0.4–0.6)
Gamma Globulin: 2 g/dL — ABNORMAL HIGH (ref 0.8–1.7)
IgA: 13 mg/dL — ABNORMAL LOW (ref 69–380)
IgG (Immunoglobin G), Serum: 2000 mg/dL — ABNORMAL HIGH (ref 690–1700)
IgM, Serum: 16 mg/dL — ABNORMAL LOW (ref 52–322)
Total Protein, Serum Electrophoresis: 8.3 g/dL — ABNORMAL HIGH (ref 6.1–8.1)

## 2014-11-21 LAB — KAPPA/LAMBDA LIGHT CHAINS
Kappa free light chain: 11.1 mg/dL — ABNORMAL HIGH (ref 0.33–1.94)
Kappa:Lambda Ratio: 16.09 — ABNORMAL HIGH (ref 0.26–1.65)
Lambda Free Lght Chn: 0.69 mg/dL (ref 0.57–2.63)

## 2014-11-21 MED ORDER — LENALIDOMIDE 15 MG PO CAPS: ORAL_CAPSULE | ORAL | Status: DC

## 2014-11-21 NOTE — Telephone Encounter (Signed)
Husband reports pt received 25 mg Revlimid today.  Instructions read for her to take once daily for 14 days on and 7 days off.   Pt will start on Tuesday 4/19.   Confirmed dose w/ Dr. Alvy Bimler,  Since pt has already received this Rx from MD at [facility],  Dr. Lurlean Leyden says it is ok to proceed w/ this dose.   Informed pt ok to take Revlimid 25 mg start Tuesday morning.  He verbalized understanding.

## 2014-11-26 ENCOUNTER — Other Ambulatory Visit: Payer: Self-pay

## 2014-11-26 ENCOUNTER — Other Ambulatory Visit: Payer: Self-pay | Admitting: Hematology and Oncology

## 2014-11-26 ENCOUNTER — Ambulatory Visit (HOSPITAL_BASED_OUTPATIENT_CLINIC_OR_DEPARTMENT_OTHER): Payer: Medicare Other

## 2014-11-26 ENCOUNTER — Other Ambulatory Visit: Payer: Self-pay | Admitting: Medical Oncology

## 2014-11-26 VITALS — BP 107/72 | HR 80 | Temp 98.4°F | Resp 16

## 2014-11-26 DIAGNOSIS — C9000 Multiple myeloma not having achieved remission: Secondary | ICD-10-CM

## 2014-11-26 DIAGNOSIS — Z5112 Encounter for antineoplastic immunotherapy: Secondary | ICD-10-CM | POA: Diagnosis not present

## 2014-11-26 MED ORDER — ONDANSETRON HCL 8 MG PO TABS
ORAL_TABLET | ORAL | Status: AC
  Filled 2014-11-26: qty 1

## 2014-11-26 MED ORDER — BORTEZOMIB CHEMO SQ INJECTION 3.5 MG (2.5MG/ML)
1.3000 mg/m2 | Freq: Once | INTRAMUSCULAR | Status: AC
  Administered 2014-11-26: 2 mg via SUBCUTANEOUS
  Filled 2014-11-26: qty 2

## 2014-11-26 MED ORDER — ONDANSETRON HCL 8 MG PO TABS
8.0000 mg | ORAL_TABLET | Freq: Once | ORAL | Status: AC
  Administered 2014-11-26: 8 mg via ORAL

## 2014-11-26 NOTE — Patient Instructions (Signed)
[place] Discharge Instructions for Patients Receiving Chemotherapy  Today you received the following chemotherapy agents: Velcade.  To help prevent nausea and vomiting after your treatment, we encourage you to take your nausea medication: Prochlorperazine (Compazine). Take one every six hours as needed.   If you develop nausea and vomiting that is not controlled by your nausea medication, call the clinic.   Continue Acyclovir twice daily to prevent shingles.   BELOW ARE SYMPTOMS THAT SHOULD BE REPORTED IMMEDIATELY:  *FEVER GREATER THAN 100.5 F  *CHILLS WITH OR WITHOUT FEVER  NAUSEA AND VOMITING THAT IS NOT CONTROLLED WITH YOUR NAUSEA MEDICATION  *UNUSUAL SHORTNESS OF BREATH  *UNUSUAL BRUISING OR BLEEDING  TENDERNESS IN MOUTH AND THROAT WITH OR WITHOUT PRESENCE OF ULCERS  *URINARY PROBLEMS  *BOWEL PROBLEMS  UNUSUAL RASH Items with * indicate a potential emergency and should be followed up as soon as possible.  Feel free to call the clinic should you have any questions or concerns. The clinic phone number is [phone].  Please show the [facility] at check-in to the Emergency Department and triage nurse.

## 2014-11-27 ENCOUNTER — Telehealth: Payer: Self-pay

## 2014-11-27 NOTE — Telephone Encounter (Signed)
-----   Message from Brien Few, RN sent at 11/26/2014  1:07 PM EDT ----- Regarding: Harlon Ditty Follow UP call Velcade 4/19 (Also started Rev/Dex this day). Call home #

## 2014-11-27 NOTE — Telephone Encounter (Signed)
Skin at injection site is slightly reddened, but it is sensitive to hot water. She is eating and drinking OK.  Discussed stool softener usage and varying taking it to maintain daily BM.

## 2014-11-29 ENCOUNTER — Ambulatory Visit (HOSPITAL_BASED_OUTPATIENT_CLINIC_OR_DEPARTMENT_OTHER): Payer: Medicare Other

## 2014-11-29 VITALS — BP 91/39 | HR 89 | Temp 98.5°F | Resp 18

## 2014-11-29 DIAGNOSIS — Z5112 Encounter for antineoplastic immunotherapy: Secondary | ICD-10-CM | POA: Diagnosis not present

## 2014-11-29 DIAGNOSIS — C9000 Multiple myeloma not having achieved remission: Secondary | ICD-10-CM | POA: Diagnosis not present

## 2014-11-29 MED ORDER — BORTEZOMIB CHEMO SQ INJECTION 3.5 MG (2.5MG/ML)
1.3000 mg/m2 | Freq: Once | INTRAMUSCULAR | Status: AC
  Administered 2014-11-29: 2 mg via SUBCUTANEOUS
  Filled 2014-11-29: qty 2

## 2014-11-29 MED ORDER — ONDANSETRON HCL 8 MG PO TABS
8.0000 mg | ORAL_TABLET | Freq: Once | ORAL | Status: AC
  Administered 2014-11-29: 8 mg via ORAL

## 2014-11-29 MED ORDER — ONDANSETRON HCL 8 MG PO TABS
ORAL_TABLET | ORAL | Status: AC
  Filled 2014-11-29: qty 1

## 2014-11-29 NOTE — Patient Instructions (Signed)
   Pt did not want AVS  [facility] Discharge Instructions for Patients Receiving Chemotherapy  Today you received the following chemotherapy agents VELCADE To help prevent nausea and vomiting after your treatment, we encourage you to take your nausea medication as prescribed.   If you develop nausea and vomiting that is not controlled by your nausea medication, call the clinic.   BELOW ARE SYMPTOMS THAT SHOULD BE REPORTED IMMEDIATELY:  *FEVER GREATER THAN 100.5 F  *CHILLS WITH OR WITHOUT FEVER  NAUSEA AND VOMITING THAT IS NOT CONTROLLED WITH YOUR NAUSEA MEDICATION  *UNUSUAL SHORTNESS OF BREATH  *UNUSUAL BRUISING OR BLEEDING  TENDERNESS IN MOUTH AND THROAT WITH OR WITHOUT PRESENCE OF ULCERS  *URINARY PROBLEMS  *BOWEL PROBLEMS  UNUSUAL RASH Items with * indicate a potential emergency and should be followed up as soon as possible.  Feel free to call the clinic you have any questions or concerns. The clinic phone number is [phone].  Please show the [place] at check-in to the Emergency Department and triage nurse.

## 2014-11-29 NOTE — Progress Notes (Signed)
OK to use labs from 4/12 for this cycle velcade per Dr Alvy Bimler

## 2014-12-02 ENCOUNTER — Ambulatory Visit (HOSPITAL_BASED_OUTPATIENT_CLINIC_OR_DEPARTMENT_OTHER): Payer: Medicare Other

## 2014-12-02 ENCOUNTER — Telehealth: Payer: Self-pay | Admitting: *Deleted

## 2014-12-02 ENCOUNTER — Encounter: Payer: Self-pay | Admitting: Nurse Practitioner

## 2014-12-02 ENCOUNTER — Encounter (HOSPITAL_BASED_OUTPATIENT_CLINIC_OR_DEPARTMENT_OTHER): Payer: Medicare Other | Admitting: Hematology and Oncology

## 2014-12-02 ENCOUNTER — Ambulatory Visit (HOSPITAL_BASED_OUTPATIENT_CLINIC_OR_DEPARTMENT_OTHER): Payer: Medicare Other | Admitting: Nurse Practitioner

## 2014-12-02 VITALS — BP 75/63 | HR 86 | Temp 98.6°F | Resp 18

## 2014-12-02 DIAGNOSIS — C9000 Multiple myeloma not having achieved remission: Secondary | ICD-10-CM

## 2014-12-02 DIAGNOSIS — R112 Nausea with vomiting, unspecified: Secondary | ICD-10-CM | POA: Insufficient documentation

## 2014-12-02 DIAGNOSIS — E860 Dehydration: Secondary | ICD-10-CM | POA: Insufficient documentation

## 2014-12-02 DIAGNOSIS — R531 Weakness: Secondary | ICD-10-CM | POA: Insufficient documentation

## 2014-12-02 DIAGNOSIS — I959 Hypotension, unspecified: Secondary | ICD-10-CM

## 2014-12-02 DIAGNOSIS — N19 Unspecified kidney failure: Secondary | ICD-10-CM

## 2014-12-02 LAB — COMPREHENSIVE METABOLIC PANEL (CC13)
ALT: 46 U/L (ref 0–55)
AST: 38 U/L — ABNORMAL HIGH (ref 5–34)
Albumin: 3.5 g/dL (ref 3.5–5.0)
Alkaline Phosphatase: 216 U/L — ABNORMAL HIGH (ref 40–150)
Anion Gap: 16 mEq/L — ABNORMAL HIGH (ref 3–11)
BUN: 24.2 mg/dL (ref 7.0–26.0)
CO2: 20 mEq/L — ABNORMAL LOW (ref 22–29)
Calcium: 9.8 mg/dL (ref 8.4–10.4)
Chloride: 104 mEq/L (ref 98–109)
Creatinine: 1.6 mg/dL — ABNORMAL HIGH (ref 0.6–1.1)
EGFR: 33 mL/min/{1.73_m2} — ABNORMAL LOW (ref >90)
Glucose: 91 mg/dl (ref 70–140)
Potassium: 3.8 mEq/L (ref 3.5–5.1)
Sodium: 139 mEq/L (ref 136–145)
Total Bilirubin: 0.29 mg/dL (ref 0.20–1.20)
Total Protein: 7.4 g/dL (ref 6.4–8.3)

## 2014-12-02 LAB — CBC WITH DIFFERENTIAL/PLATELET
BASO%: 0.4 % (ref 0.0–2.0)
Basophils Absolute: 0 10*3/uL (ref 0.0–0.1)
EOS%: 0.1 % (ref 0.0–7.0)
Eosinophils Absolute: 0 10*3/uL (ref 0.0–0.5)
HCT: 38.9 % (ref 34.8–46.6)
HGB: 12.6 g/dL (ref 11.6–15.9)
LYMPH%: 29.5 % (ref 14.0–49.7)
MCH: 29.2 pg (ref 25.1–34.0)
MCHC: 32.4 g/dL (ref 31.5–36.0)
MCV: 90.3 fL (ref 79.5–101.0)
MONO#: 0.6 10*3/uL (ref 0.1–0.9)
MONO%: 8.1 % (ref 0.0–14.0)
NEUT#: 4.2 10*3/uL (ref 1.5–6.5)
NEUT%: 61.9 % (ref 38.4–76.8)
Platelets: 170 10*3/uL (ref 145–400)
RBC: 4.31 10*6/uL (ref 3.70–5.45)
RDW: 13.8 % (ref 11.2–14.5)
WBC: 6.8 10*3/uL (ref 3.9–10.3)
lymph#: 2 10*3/uL (ref 0.9–3.3)

## 2014-12-02 LAB — TECHNOLOGIST REVIEW

## 2014-12-02 MED ORDER — SODIUM CHLORIDE 0.9 % IV SOLN
1000.0000 mL | Freq: Once | INTRAVENOUS | Status: AC
  Administered 2014-12-02: 1000 mL via INTRAVENOUS

## 2014-12-02 MED ORDER — SODIUM CHLORIDE 0.9 % IV SOLN
Freq: Once | INTRAVENOUS | Status: AC
  Administered 2014-12-02: 17:00:00 via INTRAVENOUS
  Filled 2014-12-02: qty 4

## 2014-12-02 NOTE — Assessment & Plan Note (Signed)
Pt reports chronic nausea/vomiting frequently for past few days. She appears weak and dehydrated today.  BP low at 75/63 on initial check.  Pt received approx 1200 mls IV fluid rehydration this afternoon.  She was also given zofran IV.  Confirmed that pt already has antiemetics at home.

## 2014-12-02 NOTE — Assessment & Plan Note (Signed)
Pt with significant nausea and vomiting for past few days secondary to chemotherapy.  Appears dehydrated and weak.  Pt received IV fluid rehydration today. She was encouraged to push fluids at home if possible.

## 2014-12-02 NOTE — Progress Notes (Signed)
SYMPTOM MANAGEMENT CLINIC   HPI: DAZIAH [name] 64 y.o. female diagnosed with multiple myeloma.  Currently undergoing velcade injections and revlimid oral therapy.   Pt c/o worsening chronic nausea and frequent vomiting. She feels dehydrated and weak today.  She has spent most of the last few days in the bed. She denies any fever or chills.   HPI  ROS  Past Medical History  Diagnosis Date  . MVP (mitral valve prolapse)     req prophylaxis  . Multiple myeloma 11/19/2014    Past Surgical History  Procedure Laterality Date  . Laser ablation of condylomas  1990    CIN 1 cervix  . Cataract extraction, bilateral  10/2010    Implants(ReSTOR) Bilat    has Multiple myeloma; Prerenal renal failure; Steroid withdrawal syndrome following proper administration; Neuropathic pain of left flank; Vomiting without nausea, not intractable; Nausea with vomiting; Hypotension; Dehydration; Weakness; and Hyperphosphatemia on her problem list.    is allergic to codeine and ibuprofen.    Medication List       This list is accurate as of: 12/02/14  8:41 PM.  Always use your most recent med list.               acyclovir 400 MG tablet  Commonly known as:  ZOVIRAX  Take 1 tablet (400 mg total) by mouth 2 (two) times daily.     dexamethasone 4 MG tablet  Commonly known as:  DECADRON  Take 5 tablets (20 mg total) by mouth once a week.     lenalidomide 15 MG capsule  Commonly known as:  REVLIMID  Take 1 cap daily for 14 days, rest 7 days     lisinopril-hydrochlorothiazide 20-12.5 MG per tablet  Commonly known as:  PRINZIDE,ZESTORETIC  Take 1 tablet by mouth daily.     morphine 15 MG 12 hr tablet  Commonly known as:  MS CONTIN  Take 1 tablet (15 mg total) by mouth every 12 (twelve) hours.     ondansetron 8 MG tablet  Commonly known as:  ZOFRAN  Take 8 mg by mouth every 8 (eight) hours as needed for nausea or vomiting.     oxycodone 5 MG capsule  Commonly known as:  OXY-IR  Take 1  capsule (5 mg total) by mouth every 4 (four) hours as needed.     prochlorperazine 10 MG tablet  Commonly known as:  COMPAZINE  Take 1 tablet (10 mg total) by mouth every 6 (six) hours as needed (Nausea or vomiting).         PHYSICAL EXAMINATION  Oncology Vitals 12/02/2014 12/02/2014 11/29/2014 11/26/2014 11/19/2014 03/25/2014 03/19/2013  Height - - - - 155 cm 155 cm 155 cm  Weight - - - - 59.24 kg 61.689 kg 61.236 kg  Weight (lbs) - - - - 130 lbs 10 oz 136 lbs 135 lbs  BMI (kg/m2) - - - - 24.68 kg/m2 25.7 kg/m2 25.51 kg/m2  Temp - 98.6 98.5 98.4 98.5 - -  Pulse 70 86 89 80 97 100 84  Resp 16 18 18 16 18 18 18   SpO2 - 99 - 100 97 - -  BSA (m2) - - - - 1.6 m2 1.63 m2 1.62 m2   BP Readings from Last 3 Encounters:  12/02/14 115/52  12/02/14 75/63  11/29/14 91/39    Physical Exam  Constitutional: She is oriented to person, place, and time.  Pt appears very weak, frail, and chronically ill.   HENT:  Head:  Normocephalic and atraumatic.  Mouth/Throat: Oropharynx is clear and moist.  Eyes: Conjunctivae and EOM are normal. Pupils are equal, round, and reactive to light. Right eye exhibits no discharge. Left eye exhibits no discharge. No scleral icterus.  Neck: Normal range of motion.  Pulmonary/Chest: Effort normal. No respiratory distress.  Neurological: She is alert and oriented to person, place, and time.  Skin: Skin is warm and dry. There is pallor.  Psychiatric: Affect normal.  Nursing note and vitals reviewed.   LABORATORY DATA:. Clinical Support on 12/02/2014  Component Date Value Ref Range Status  . WBC 12/02/2014 6.8  3.9 - 10.3 10e3/uL Final  . NEUT# 12/02/2014 4.2  1.5 - 6.5 10e3/uL Final  . HGB 12/02/2014 12.6  11.6 - 15.9 g/dL Final  . HCT 12/02/2014 38.9  34.8 - 46.6 % Final  . Platelets 12/02/2014 170  145 - 400 10e3/uL Final  . MCV 12/02/2014 90.3  79.5 - 101.0 fL Final  . MCH 12/02/2014 29.2  25.1 - 34.0 pg Final  . MCHC 12/02/2014 32.4  31.5 - 36.0 g/dL Final    . RBC 12/02/2014 4.31  3.70 - 5.45 10e6/uL Final  . RDW 12/02/2014 13.8  11.2 - 14.5 % Final  . lymph# 12/02/2014 2.0  0.9 - 3.3 10e3/uL Final  . MONO# 12/02/2014 0.6  0.1 - 0.9 10e3/uL Final  . Eosinophils Absolute 12/02/2014 0.0  0.0 - 0.5 10e3/uL Final  . Basophils Absolute 12/02/2014 0.0  0.0 - 0.1 10e3/uL Final  . NEUT% 12/02/2014 61.9  38.4 - 76.8 % Final  . LYMPH% 12/02/2014 29.5  14.0 - 49.7 % Final  . MONO% 12/02/2014 8.1  0.0 - 14.0 % Final  . EOS% 12/02/2014 0.1  0.0 - 7.0 % Final  . BASO% 12/02/2014 0.4  0.0 - 2.0 % Final  . Sodium 12/02/2014 139  136 - 145 mEq/L Final  . Potassium 12/02/2014 3.8  3.5 - 5.1 mEq/L Final  . Chloride 12/02/2014 104  98 - 109 mEq/L Final  . CO2 12/02/2014 20* 22 - 29 mEq/L Final  . Glucose 12/02/2014 91  70 - 140 mg/dl Final  . BUN 12/02/2014 24.2  7.0 - 26.0 mg/dL Final  . Creatinine 12/02/2014 1.6* 0.6 - 1.1 mg/dL Final  . Total Bilirubin 12/02/2014 0.29  0.20 - 1.20 mg/dL Final  . Alkaline Phosphatase 12/02/2014 216* 40 - 150 U/L Final  . AST 12/02/2014 38* 5 - 34 U/L Final  . ALT 12/02/2014 46  0 - 55 U/L Final  . Total Protein 12/02/2014 7.4  6.4 - 8.3 g/dL Final  . Albumin 12/02/2014 3.5  3.5 - 5.0 g/dL Final  . Calcium 12/02/2014 9.8  8.4 - 10.4 mg/dL Final  . Anion Gap 12/02/2014 16* 3 - 11 mEq/L Final  . EGFR 12/02/2014 33* >90 ml/min/1.73 m2 Final   eGFR is calculated using the CKD-EPI Creatinine Equation (2009)  . Technologist Review 12/02/2014 Oc meta and myelo   Final     RADIOGRAPHIC STUDIES: No results found.  ASSESSMENT/PLAN:    Multiple myeloma Pt received her last Velcade injection on Friday 11/29/14.  She also continues to take the Revlimid oral therapy as directed.   She is scheduled to return tomorrow 12/03/14 for labs, visit, and her next velcade injection.  Have advised pt that will cancel labs scheduled for tomorrow; since just obtained labs today.    Pt may very well need additional IV hydration tomorrow.     Prerenal renal failure Creatinine increased to 1.6.  Most likely secondary to dehydration. Will continue to monitor.    Nausea with vomiting Pt reports chronic nausea/vomiting frequently for past few days. She appears weak and dehydrated today.  BP low at 75/63 on initial check.  Pt received approx 1200 mls IV fluid rehydration this afternoon.  She was also given zofran IV.  Confirmed that pt already has antiemetics at home.    Hypotension BP on initial check was 75/63.  Pt confirmed that she took her lisinopril/HCTZ this morning.  Pt has ben vomiting frequently; and is dehydrated today. Pt received approx 1200 mls IV fluid rehydration today. BP improved to 115/52.  Advised pt to discontinue all BP meds for the time being.      Dehydration Pt with significant nausea and vomiting for past few days secondary to chemotherapy.  Appears dehydrated and weak.  Pt received IV fluid rehydration today. She was encouraged to push fluids at home if possible.    Weakness Pt with significant nausea and vomiting for past few days secondary to chemotherapy.  Appears dehydrated and weak.  Pt received IV fluid rehydration today. She was encouraged to push fluids at home if possible.     Hyperphosphatemia Alk phos increased to 216 from 155.  Will continue to monitor.    Patient stated understanding of all instructions; and was in agreement with this plan of care. The patient knows to call the clinic with any problems, questions or concerns.  Review/collaboration with Dr. Alvy Bimler regarding all aspects of patient's visit today.   Total time spent with patient was 40 minutes;  with greater than 75 percent of that time spent in face to face counseling regarding patient's symptoms,  and coordination of care and follow up.  Disclaimer: This note was dictated with voice recognition software. Similar sounding words can inadvertently be transcribed and may not be corrected upon review.   Drue Second,  NP 12/02/2014

## 2014-12-02 NOTE — Patient Instructions (Signed)

## 2014-12-02 NOTE — Assessment & Plan Note (Signed)
Pt received her last Velcade injection on Friday 11/29/14.  She also continues to take the Revlimid oral therapy as directed.   She is scheduled to return tomorrow 12/03/14 for labs, visit, and her next velcade injection.  Have advised pt that will cancel labs scheduled for tomorrow; since just obtained labs today.    Pt may very well need additional IV hydration tomorrow.

## 2014-12-02 NOTE — Telephone Encounter (Signed)
TC from pt stating she is experiencing ongoing and worsening weakness, ongoing nausea with vomiting. She is unable to keep much food down nor liquids. Has taken compazine and zofran w/o relief though has not been taking it around the clock. Pt states she does not know what to do. Informed patient and her husband that she could be seen today by Selena Lesser, NP in [facility] for possible dehydration. Husband states he will get her here. Labs ordered (CBC and CMET)  Labs @ 3pm and [place] @ 3:15 pm

## 2014-12-02 NOTE — Assessment & Plan Note (Signed)
BP on initial check was 75/63.  Pt confirmed that she took her lisinopril/HCTZ this morning.  Pt has ben vomiting frequently; and is dehydrated today. Pt received approx 1200 mls IV fluid rehydration today. BP improved to 115/52.  Advised pt to discontinue all BP meds for the time being.

## 2014-12-02 NOTE — Assessment & Plan Note (Signed)
Alk phos increased to 216 from 155.  Will continue to monitor.

## 2014-12-02 NOTE — Assessment & Plan Note (Signed)
Creatinine increased to 1.6.  Most likely secondary to dehydration. Will continue to monitor.

## 2014-12-02 NOTE — Progress Notes (Signed)
Bp 115/52 nearing end of 1 L IVF infusion. Selena Lesser, NP notified. Patient to hold BP med tonight and to f/u with Dr. Alvy Bimler tomorrow, OK to discharge home. Patient verbalizes understanding.

## 2014-12-03 ENCOUNTER — Ambulatory Visit (HOSPITAL_BASED_OUTPATIENT_CLINIC_OR_DEPARTMENT_OTHER): Payer: Medicare Other | Admitting: Hematology and Oncology

## 2014-12-03 ENCOUNTER — Other Ambulatory Visit: Payer: Self-pay | Admitting: Medical Oncology

## 2014-12-03 ENCOUNTER — Ambulatory Visit (HOSPITAL_BASED_OUTPATIENT_CLINIC_OR_DEPARTMENT_OTHER): Payer: Medicare Other

## 2014-12-03 ENCOUNTER — Telehealth: Payer: Self-pay | Admitting: Hematology and Oncology

## 2014-12-03 ENCOUNTER — Encounter: Payer: Self-pay | Admitting: Hematology and Oncology

## 2014-12-03 ENCOUNTER — Other Ambulatory Visit: Payer: Medicare Other

## 2014-12-03 VITALS — BP 97/71 | HR 80 | Temp 98.2°F | Resp 18 | Ht 61.0 in | Wt 128.1 lb

## 2014-12-03 DIAGNOSIS — C9000 Multiple myeloma not having achieved remission: Secondary | ICD-10-CM

## 2014-12-03 DIAGNOSIS — R112 Nausea with vomiting, unspecified: Secondary | ICD-10-CM | POA: Diagnosis not present

## 2014-12-03 DIAGNOSIS — T451X5A Adverse effect of antineoplastic and immunosuppressive drugs, initial encounter: Secondary | ICD-10-CM | POA: Insufficient documentation

## 2014-12-03 DIAGNOSIS — N19 Unspecified kidney failure: Secondary | ICD-10-CM

## 2014-12-03 DIAGNOSIS — Z5112 Encounter for antineoplastic immunotherapy: Secondary | ICD-10-CM | POA: Diagnosis not present

## 2014-12-03 DIAGNOSIS — E860 Dehydration: Secondary | ICD-10-CM

## 2014-12-03 DIAGNOSIS — K5909 Other constipation: Secondary | ICD-10-CM

## 2014-12-03 DIAGNOSIS — R531 Weakness: Secondary | ICD-10-CM

## 2014-12-03 HISTORY — DX: Other constipation: K59.09

## 2014-12-03 MED ORDER — DEXAMETHASONE 4 MG PO TABS
ORAL_TABLET | ORAL | Status: AC
  Filled 2014-12-03: qty 1

## 2014-12-03 MED ORDER — RANITIDINE HCL 75 MG PO TABS: 75.0000 mg | ORAL_TABLET | Freq: Every day | ORAL | Status: DC

## 2014-12-03 MED ORDER — SENNOSIDES 8.6 MG PO TABS: 2.0000 | ORAL_TABLET | Freq: Three times a day (TID) | ORAL | Status: DC

## 2014-12-03 MED ORDER — GLYCERIN (LAXATIVE) 1 G RE SUPP: 1.0000 | Freq: Every day | RECTAL | Status: DC

## 2014-12-03 MED ORDER — ONDANSETRON HCL 8 MG PO TABS
8.0000 mg | ORAL_TABLET | Freq: Once | ORAL | Status: AC
  Administered 2014-12-03: 8 mg via ORAL

## 2014-12-03 MED ORDER — OMEPRAZOLE 20 MG PO CPDR: 20.0000 mg | DELAYED_RELEASE_CAPSULE | Freq: Every day | ORAL | Status: DC

## 2014-12-03 MED ORDER — ONDANSETRON HCL 8 MG PO TABS
ORAL_TABLET | ORAL | Status: AC
  Filled 2014-12-03: qty 1

## 2014-12-03 MED ORDER — DEXAMETHASONE 4 MG PO TABS
20.0000 mg | ORAL_TABLET | Freq: Once | ORAL | Status: AC
  Administered 2014-12-03: 20 mg via ORAL

## 2014-12-03 MED ORDER — DEXAMETHASONE 4 MG PO TABS
ORAL_TABLET | ORAL | Status: AC
  Filled 2014-12-03: qty 4

## 2014-12-03 MED ORDER — SODIUM CHLORIDE 0.9 % IV SOLN
Freq: Once | INTRAVENOUS | Status: AC
  Administered 2014-12-03: 14:00:00 via INTRAVENOUS

## 2014-12-03 MED ORDER — BORTEZOMIB CHEMO SQ INJECTION 3.5 MG (2.5MG/ML)
1.3000 mg/m2 | Freq: Once | INTRAMUSCULAR | Status: AC
  Administered 2014-12-03: 2 mg via SUBCUTANEOUS
  Filled 2014-12-03: qty 2

## 2014-12-03 NOTE — Telephone Encounter (Signed)
gave and printed appt sched anda vs for pt for April and May °

## 2014-12-03 NOTE — Telephone Encounter (Signed)
cx labs per pof

## 2014-12-03 NOTE — Assessment & Plan Note (Signed)
I recommend daily IV fluids and aggressive management of nausea, vomiting and constipation

## 2014-12-03 NOTE — Assessment & Plan Note (Signed)
She tolerated treatment very poorly. I suspected that this is because of significant deconditioning and chronic nausea and vomiting causing dehydration. Her blood work look stable at current dose of treatment and I am not inclined to change the dose. I recommend dental clearance before we proceed with Zometa. Due to her significant decline, I will see her on a weekly basis to provide supportive care.

## 2014-12-03 NOTE — Progress Notes (Signed)
[facility] OFFICE PROGRESS NOTE  Patient Care Team: Jim Osborne, MD as PCP - General (Internal Medicine)  SUMMARY OF ONCOLOGIC HISTORY: Oncology History    M-protein 0.69 gm/dl IFIX - IgG, Kappa IgG - 868 IgA - 19 IgM - < 20 Kappa - 21 Lambda - 5.7  09/06/2014 - Bone marrow aspirate and biopsy:   Normocellular marrow for age (40%) with a small monoclonal plasma cell population (1% on aspirate). Karyotype 46, XX  FISH Negative for myeloma associated changes  09/12/2014 - PET/CT  Two regions that are concerning for disease, one adjacent/involving the left ninth rib and one in the marrow of the right femur, in this patient with history of plasmacytoma.      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    INTERVAL HISTORY: Please see below for problem oriented charting.  she is seen prior to week therapy. She tolerated treatment well on day 1 but then has severe nausea, vomiting, dehydration since end of last week. She felt better after recent IV fluid resuscitation. She is  profoundly constipated for several days and have poor oral intake. She continues to feel weak overall. She has very mild rash from recent chemotherapy injection.  REVIEW OF SYSTEMS:   Constitutional: Denies fevers, chills or abnormal weight loss Eyes: Denies blurriness of vision Ears, nose, mouth, throat, and face: Denies mucositis or sore throat Respiratory: Denies cough, dyspnea or wheezes Cardiovascular: Denies palpitation, chest discomfort or lower extremity swelling Lymphatics: Denies new lymphadenopathy or easy bruising Neurological:Denies numbness, tingling  Behavioral/Psych: Mood is stable, no new changes  All other systems were reviewed with the patient and are negative.  I have reviewed the past medical history, past surgical history, social history and family history with the patient and they are unchanged from previous note.  ALLERGIES:  is allergic to codeine and ibuprofen.  MEDICATIONS:  Current Outpatient Prescriptions  Medication Sig Dispense Refill  . acyclovir (ZOVIRAX) 400 MG tablet Take 1 tablet (400 mg total) by mouth 2 (two) times daily. 60 tablet 3  . aspirin 81 MG tablet Take 81 mg by mouth daily.    . cholecalciferol (VITAMIN D) 1000 UNITS tablet Take 1,000 Units by mouth daily.    . dexamethasone (DECADRON) 4 MG tablet Take 5 tablets (20 mg total) by mouth once a week. 80 tablet 3  . lenalidomide (REVLIMID) 15 MG capsule Take 1 cap daily for 14 days, rest 7 days 14 capsule 0  . ondansetron (ZOFRAN-ODT) 8 MG disintegrating tablet   0  . oxycodone (OXY-IR) 5   MG capsule Take 1 capsule (5 mg total) by mouth every 4 (four) hours as needed. 60 capsule 0  . prochlorperazine (COMPAZINE) 10 MG tablet Take 1 tablet (10 mg total) by mouth every 6 (six) hours as needed (Nausea or vomiting). 30 tablet 1  . Glycerin, Laxative, 1 G SUPP Place 1 suppository (1 g total) rectally daily. 12 suppository 3  . morphine (MS CONTIN) 15 MG 12 hr tablet Take 1 tablet (15 mg total) by  mouth every 12 (twelve) hours. (Patient not taking: Reported on 12/03/2014) 60 tablet 0  . omeprazole (PRILOSEC) 20 MG capsule Take 1 capsule (20 mg total) by mouth daily. 30 capsule 6  . ondansetron (ZOFRAN) 8 MG tablet Take 8 mg by mouth every 8 (eight) hours as needed for nausea or vomiting.    . ranitidine (ZANTAC) 75 MG tablet Take 1 tablet (75 mg total) by mouth at bedtime. 30 tablet 6  . senna (SENOKOT) 8.6 MG tablet Take 2 tablets (17.2 mg total) by mouth 3 (three) times daily. 180 tablet 3   No current facility-administered medications for this visit.    PHYSICAL EXAMINATION: ECOG PERFORMANCE STATUS: 2 - Symptomatic, <50% confined to bed  Filed Vitals:   12/03/14 1230  BP: 97/71  Pulse: 80  Temp: 98.2 F (36.8 C)  Resp: 18   Filed Weights   12/03/14 1230  Weight: 128 lb 1.6 oz (58.106 kg)    GENERAL:alert, no distress and comfortable. She is cushingoid SKIN: skin color, texture, turgor are normal, no rashes or significant lesions EYES: normal, Conjunctiva are pink and non-injected, sclera clear OROPHARYNX:no exudate, no erythema and lips, buccal mucosa, and tongue normal . Dry mucous membrane is noted NECK: supple, thyroid normal size, non-tender, without nodularity LYMPH:  no palpable lymphadenopathy in the cervical, axillary or inguinal LUNGS: clear to auscultation and percussion with normal breathing effort HEART: regular rate & rhythm and no murmurs and no lower extremity edema ABDOMEN:abdomen soft, non-tender and normal bowel sounds Musculoskeletal:no cyanosis of digits and no clubbing  NEURO: alert & oriented x 3 with fluent speech, no focal motor/sensory deficits  LABORATORY DATA:  I have reviewed the data as listed    Component Value Date/Time   NA 139 12/02/2014 1513   K 3.8 12/02/2014 1513   CO2 20* 12/02/2014 1513   GLUCOSE 91 12/02/2014 1513   BUN 24.2 12/02/2014 1513   CREATININE 1.6* 12/02/2014 1513   CALCIUM 9.8 12/02/2014 1513   PROT 7.4  12/02/2014 1513   ALBUMIN 3.5 12/02/2014 1513   AST 38* 12/02/2014 1513   ALT 46 12/02/2014 1513   ALKPHOS 216* 12/02/2014 1513   BILITOT 0.29 12/02/2014 1513    No results found for: SPEP, UPEP  Lab Results  Component Value Date   WBC 6.8 12/02/2014   NEUTROABS 4.2 12/02/2014   HGB 12.6 12/02/2014   HCT 38.9 12/02/2014   MCV 90.3 12/02/2014   PLT 170 12/02/2014      Chemistry      Component Value Date/Time   NA 139 12/02/2014 1513   K 3.8 12/02/2014 1513   CO2 20* 12/02/2014 1513   BUN 24.2 12/02/2014 1513   CREATININE 1.6* 12/02/2014 1513      Component Value Date/Time   CALCIUM 9.8 12/02/2014 1513   ALKPHOS 216* 12/02/2014 1513   AST 38* 12/02/2014 1513   ALT 46 12/02/2014 1513   BILITOT 0.29 12/02/2014 1513     ASSESSMENT & PLAN:  Multiple myeloma She tolerated treatment very   poorly. I suspected that this is because of significant deconditioning and chronic nausea and vomiting causing dehydration. Her blood work look stable at current dose of treatment and I am not inclined to change the dose. I recommend dental clearance before we proceed with Zometa. Due to her significant decline, I will see her on a weekly basis to provide supportive care.   Prerenal renal failure She had recent dehydration from nausea and vomiting. I recommend daily IV fluids as clinically she continues to appear dehydrated. I recommend she continues to hold her diuretic therapy for now.   Chemotherapy induced nausea and vomiting The cause of her nausea & vomiting is multifactorial, likely due to steroid withdrawal, possible severe gastritis from chronic steroid exposure, and is exacerbated by recent constipation, dehydration, side effects of narcotics and possibly anticipatory nausea. I recommend daily IV fluids, regular anti-emetics,  aggressive management of constipation and close follow-up visit. I will also start her on proton pump inhibitor in the morning and Zantac at nighttime  for possible component of gastritis   Dehydration  I recommend daily IV fluids and aggressive management of nausea, vomiting and constipation   Other constipation  I recommend aggressive laxative therapy.   Weakness  Her weakness is exacerbated by recent nausea, vomiting, dehydration and chronic steroid dependency causing proximal myopathy. I recommend gentle exercise as tolerated.    No orders of the defined types were placed in this encounter.   All questions were answered. The patient knows to call the clinic with any problems, questions or concerns. No barriers to learning was detected. I spent 40 minutes counseling the patient face to face. The total time spent in the appointment was 55 minutes and more than 50% was on counseling and review of test results     [facility], [place], MD 12/03/2014 2:47 PM

## 2014-12-03 NOTE — Assessment & Plan Note (Signed)
I recommend aggressive laxative therapy 

## 2014-12-03 NOTE — Progress Notes (Signed)
Faxed revlimid pa form to [facility] °

## 2014-12-03 NOTE — Patient Instructions (Signed)
Dehydration, Adult Dehydration is when you lose more fluids from the body than you take in. Vital organs like the kidneys, brain, and heart cannot function without a proper amount of fluids and salt. Any loss of fluids from the body can cause dehydration.  CAUSES   Vomiting.  Diarrhea.  Excessive sweating.  Excessive urine output.  Fever. SYMPTOMS  Mild dehydration  Thirst.  Dry lips.  Slightly dry mouth. Moderate dehydration  Very dry mouth.  Sunken eyes.  Skin does not bounce back quickly when lightly pinched and released.  Dark urine and decreased urine production.  Decreased tear production.  Headache. Severe dehydration  Very dry mouth.  Extreme thirst.  Rapid, weak pulse (more than 100 beats per minute at rest).  Cold hands and feet.  Not able to sweat in spite of heat and temperature.  Rapid breathing.  Blue lips.  Confusion and lethargy.  Difficulty being awakened.  Minimal urine production.  No tears. DIAGNOSIS  Your caregiver will diagnose dehydration based on your symptoms and your exam. Blood and urine tests will help confirm the diagnosis. The diagnostic evaluation should also identify the cause of dehydration. TREATMENT  Treatment of mild or moderate dehydration can often be done at home by increasing the amount of fluids that you drink. It is best to drink small amounts of fluid more often. Drinking too much at one time can make vomiting worse. Refer to the home care instructions below. Severe dehydration needs to be treated at the hospital where you will probably be given intravenous (IV) fluids that contain water and electrolytes. HOME CARE INSTRUCTIONS   Ask your caregiver about specific rehydration instructions.  Drink enough fluids to keep your urine clear or pale yellow.  Drink small amounts frequently if you have nausea and vomiting.  Eat as you normally do.  Avoid:  Foods or drinks high in sugar.  Carbonated  drinks.  Juice.  Extremely hot or cold fluids.  Drinks with caffeine.  Fatty, greasy foods.  Alcohol.  Tobacco.  Overeating.  Gelatin desserts.  Wash your hands well to avoid spreading bacteria and viruses.  Only take over-the-counter or prescription medicines for pain, discomfort, or fever as directed by your caregiver.  Ask your caregiver if you should continue all prescribed and over-the-counter medicines.  Keep all follow-up appointments with your caregiver. SEEK MEDICAL CARE IF:  You have abdominal pain and it increases or stays in one area (localizes).  You have a rash, stiff neck, or severe headache.  You are irritable, sleepy, or difficult to awaken.  You are weak, dizzy, or extremely thirsty. SEEK IMMEDIATE MEDICAL CARE IF:   You are unable to keep fluids down or you get worse despite treatment.  You have frequent episodes of vomiting or diarrhea.  You have blood or green matter (bile) in your vomit.  You have blood in your stool or your stool looks black and tarry.  You have not urinated in 6 to 8 hours, or you have only urinated a small amount of very dark urine.  You have a fever.  You faint. MAKE SURE YOU:   Understand these instructions.  Will watch your condition.  Will get help right away if you are not doing well or get worse. Document Released: 07/26/2005 Document Revised: 10/18/2011 Document Reviewed: 03/15/2011 Exit[employer] Patient Information 2015 [employer]. This information is not intended to replace advice given to you by your health care provider. Make sure you discuss any questions you have with your health care   provider.  

## 2014-12-03 NOTE — Assessment & Plan Note (Signed)
She had recent dehydration from nausea and vomiting. I recommend daily IV fluids as clinically she continues to appear dehydrated. I recommend she continues to hold her diuretic therapy for now.

## 2014-12-03 NOTE — Progress Notes (Signed)
This encounter was created in error - please disregard.

## 2014-12-03 NOTE — Assessment & Plan Note (Signed)
Her weakness is exacerbated by recent nausea, vomiting, dehydration and chronic steroid dependency causing proximal myopathy. I recommend gentle exercise as tolerated.

## 2014-12-03 NOTE — Assessment & Plan Note (Signed)
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 I recommend daily IV fluids, regular anti-emetics,  aggressive management of constipation and close follow-up visit. I will also start her on proton pump inhibitor in the morning and Zantac at nighttime for possible component of gastritis

## 2014-12-04 ENCOUNTER — Ambulatory Visit (HOSPITAL_BASED_OUTPATIENT_CLINIC_OR_DEPARTMENT_OTHER): Payer: Medicare Other

## 2014-12-04 ENCOUNTER — Encounter: Payer: Self-pay | Admitting: Hematology and Oncology

## 2014-12-04 ENCOUNTER — Encounter: Payer: Self-pay | Admitting: *Deleted

## 2014-12-04 VITALS — BP 113/72 | HR 61 | Temp 98.1°F | Resp 18

## 2014-12-04 DIAGNOSIS — E860 Dehydration: Secondary | ICD-10-CM

## 2014-12-04 DIAGNOSIS — N19 Unspecified kidney failure: Secondary | ICD-10-CM

## 2014-12-04 DIAGNOSIS — R112 Nausea with vomiting, unspecified: Secondary | ICD-10-CM | POA: Diagnosis not present

## 2014-12-04 MED ORDER — PROMETHAZINE HCL 25 MG/ML IJ SOLN
25.0000 mg | Freq: Once | INTRAMUSCULAR | Status: AC
  Administered 2014-12-04: 12.5 mg via INTRAVENOUS
  Filled 2014-12-04: qty 1

## 2014-12-04 MED ORDER — SODIUM CHLORIDE 0.9 % IV SOLN
1000.0000 mL | Freq: Once | INTRAVENOUS | Status: AC
  Administered 2014-12-04: 1000 mL via INTRAVENOUS

## 2014-12-04 NOTE — Patient Instructions (Signed)
Dehydration, Adult Dehydration is when you lose more fluids from the body than you take in. Vital organs like the kidneys, brain, and heart cannot function without a proper amount of fluids and salt. Any loss of fluids from the body can cause dehydration.  CAUSES   Vomiting.  Diarrhea.  Excessive sweating.  Excessive urine output.  Fever. SYMPTOMS  Mild dehydration  Thirst.  Dry lips.  Slightly dry mouth. Moderate dehydration  Very dry mouth.  Sunken eyes.  Skin does not bounce back quickly when lightly pinched and released.  Dark urine and decreased urine production.  Decreased tear production.  Headache. Severe dehydration  Very dry mouth.  Extreme thirst.  Rapid, weak pulse (more than 100 beats per minute at rest).  Cold hands and feet.  Not able to sweat in spite of heat and temperature.  Rapid breathing.  Blue lips.  Confusion and lethargy.  Difficulty being awakened.  Minimal urine production.  No tears. DIAGNOSIS  Your caregiver will diagnose dehydration based on your symptoms and your exam. Blood and urine tests will help confirm the diagnosis. The diagnostic evaluation should also identify the cause of dehydration. TREATMENT  Treatment of mild or moderate dehydration can often be done at home by increasing the amount of fluids that you drink. It is best to drink small amounts of fluid more often. Drinking too much at one time can make vomiting worse. Refer to the home care instructions below. Severe dehydration needs to be treated at the hospital where you will probably be given intravenous (IV) fluids that contain water and electrolytes. HOME CARE INSTRUCTIONS   Ask your caregiver about specific rehydration instructions.  Drink enough fluids to keep your urine clear or pale yellow.  Drink small amounts frequently if you have nausea and vomiting.  Eat as you normally do.  Avoid:  Foods or drinks high in sugar.  Carbonated  drinks.  Juice.  Extremely hot or cold fluids.  Drinks with caffeine.  Fatty, greasy foods.  Alcohol.  Tobacco.  Overeating.  Gelatin desserts.  Wash your hands well to avoid spreading bacteria and viruses.  Only take over-the-counter or prescription medicines for pain, discomfort, or fever as directed by your caregiver.  Ask your caregiver if you should continue all prescribed and over-the-counter medicines.  Keep all follow-up appointments with your caregiver. SEEK MEDICAL CARE IF:  You have abdominal pain and it increases or stays in one area (localizes).  You have a rash, stiff neck, or severe headache.  You are irritable, sleepy, or difficult to awaken.  You are weak, dizzy, or extremely thirsty. SEEK IMMEDIATE MEDICAL CARE IF:   You are unable to keep fluids down or you get worse despite treatment.  You have frequent episodes of vomiting or diarrhea.  You have blood or green matter (bile) in your vomit.  You have blood in your stool or your stool looks black and tarry.  You have not urinated in 6 to 8 hours, or you have only urinated a small amount of very dark urine.  You have a fever.  You faint. MAKE SURE YOU:   Understand these instructions.  Will watch your condition.  Will get help right away if you are not doing well or get worse. Document Released: 07/26/2005 Document Revised: 10/18/2011 Document Reviewed: 03/15/2011 Exit[employer] Patient Information 2015 [employer]. This information is not intended to replace advice given to you by your health care provider. Make sure you discuss any questions you have with your health care   provider.  

## 2014-12-04 NOTE — Progress Notes (Signed)
Per [facility] revlimid is approved 12/03/14-12/02/15 under medicare part d  [id number]. I will fwd to medical records.

## 2014-12-05 ENCOUNTER — Ambulatory Visit (HOSPITAL_BASED_OUTPATIENT_CLINIC_OR_DEPARTMENT_OTHER): Payer: Medicare Other

## 2014-12-05 VITALS — BP 122/73 | HR 68 | Temp 97.7°F | Resp 20

## 2014-12-05 DIAGNOSIS — R112 Nausea with vomiting, unspecified: Secondary | ICD-10-CM | POA: Diagnosis not present

## 2014-12-05 DIAGNOSIS — E860 Dehydration: Secondary | ICD-10-CM | POA: Diagnosis not present

## 2014-12-05 DIAGNOSIS — N19 Unspecified kidney failure: Secondary | ICD-10-CM

## 2014-12-05 MED ORDER — PROMETHAZINE HCL 25 MG/ML IJ SOLN
25.0000 mg | Freq: Once | INTRAMUSCULAR | Status: AC
  Administered 2014-12-05: 25 mg via INTRAVENOUS
  Filled 2014-12-05: qty 1

## 2014-12-05 MED ORDER — SODIUM CHLORIDE 0.9 % IV SOLN
Freq: Once | INTRAVENOUS | Status: AC
  Administered 2014-12-05: 13:00:00 via INTRAVENOUS

## 2014-12-05 NOTE — Patient Instructions (Signed)

## 2014-12-06 ENCOUNTER — Telehealth: Payer: Self-pay | Admitting: *Deleted

## 2014-12-06 ENCOUNTER — Ambulatory Visit (HOSPITAL_BASED_OUTPATIENT_CLINIC_OR_DEPARTMENT_OTHER): Payer: Medicare Other

## 2014-12-06 DIAGNOSIS — N19 Unspecified kidney failure: Secondary | ICD-10-CM | POA: Diagnosis not present

## 2014-12-06 DIAGNOSIS — Z5112 Encounter for antineoplastic immunotherapy: Secondary | ICD-10-CM | POA: Diagnosis not present

## 2014-12-06 DIAGNOSIS — C9000 Multiple myeloma not having achieved remission: Secondary | ICD-10-CM | POA: Diagnosis not present

## 2014-12-06 MED ORDER — SODIUM CHLORIDE 0.9 % IV SOLN
Freq: Once | INTRAVENOUS | Status: AC
  Administered 2014-12-06: 13:00:00 via INTRAVENOUS

## 2014-12-06 MED ORDER — ONDANSETRON HCL 8 MG PO TABS
ORAL_TABLET | ORAL | Status: AC
  Filled 2014-12-06: qty 1

## 2014-12-06 MED ORDER — BORTEZOMIB CHEMO SQ INJECTION 3.5 MG (2.5MG/ML)
1.3000 mg/m2 | Freq: Once | INTRAMUSCULAR | Status: AC
  Administered 2014-12-06: 2 mg via SUBCUTANEOUS
  Filled 2014-12-06: qty 2

## 2014-12-06 MED ORDER — ONDANSETRON HCL 8 MG PO TABS
8.0000 mg | ORAL_TABLET | Freq: Once | ORAL | Status: AC
  Administered 2014-12-06: 8 mg via ORAL

## 2014-12-06 MED ORDER — PROMETHAZINE HCL 25 MG/ML IJ SOLN
25.0000 mg | Freq: Once | INTRAMUSCULAR | Status: AC
  Administered 2014-12-06: 25 mg via INTRAVENOUS
  Filled 2014-12-06: qty 1

## 2014-12-06 NOTE — Telephone Encounter (Signed)
Revlimid prescription with authorization number given to Raquel for prior authorization

## 2014-12-09 ENCOUNTER — Encounter: Payer: Self-pay | Admitting: Hematology and Oncology

## 2014-12-09 ENCOUNTER — Telehealth: Payer: Self-pay | Admitting: *Deleted

## 2014-12-09 NOTE — Progress Notes (Signed)
I faxed [employer] request for revlimid

## 2014-12-09 NOTE — Progress Notes (Signed)
Sunday Spillers at [employer]  [phone] left message that they are going to reach out to the patient to see if she qualifies for patient asst. Her copy for Revimid is $796.

## 2014-12-09 NOTE — Telephone Encounter (Signed)
[employer] will verify insurance and make delivery arrangements

## 2014-12-10 ENCOUNTER — Encounter: Payer: Self-pay | Admitting: Hematology and Oncology

## 2014-12-10 ENCOUNTER — Other Ambulatory Visit: Payer: Self-pay | Admitting: Hematology and Oncology

## 2014-12-10 ENCOUNTER — Telehealth: Payer: Self-pay | Admitting: *Deleted

## 2014-12-10 ENCOUNTER — Ambulatory Visit (HOSPITAL_BASED_OUTPATIENT_CLINIC_OR_DEPARTMENT_OTHER): Payer: Medicare Other

## 2014-12-10 ENCOUNTER — Telehealth: Payer: Self-pay | Admitting: Hematology and Oncology

## 2014-12-10 ENCOUNTER — Ambulatory Visit (HOSPITAL_BASED_OUTPATIENT_CLINIC_OR_DEPARTMENT_OTHER): Payer: Medicare Other | Admitting: Hematology and Oncology

## 2014-12-10 VITALS — BP 139/70 | HR 70 | Temp 98.1°F | Resp 18 | Ht 61.0 in | Wt 130.8 lb

## 2014-12-10 DIAGNOSIS — N19 Unspecified kidney failure: Secondary | ICD-10-CM

## 2014-12-10 DIAGNOSIS — C9000 Multiple myeloma not having achieved remission: Secondary | ICD-10-CM

## 2014-12-10 DIAGNOSIS — R112 Nausea with vomiting, unspecified: Secondary | ICD-10-CM

## 2014-12-10 DIAGNOSIS — T451X5A Adverse effect of antineoplastic and immunosuppressive drugs, initial encounter: Secondary | ICD-10-CM

## 2014-12-10 DIAGNOSIS — K5909 Other constipation: Secondary | ICD-10-CM | POA: Diagnosis not present

## 2014-12-10 MED ORDER — PROMETHAZINE HCL 25 MG/ML IJ SOLN
25.0000 mg | Freq: Once | INTRAMUSCULAR | Status: AC
  Administered 2014-12-10: 25 mg via INTRAVENOUS
  Filled 2014-12-10: qty 1

## 2014-12-10 MED ORDER — SODIUM CHLORIDE 0.9 % IV SOLN
1000.0000 mL | Freq: Once | INTRAVENOUS | Status: AC
  Administered 2014-12-10: 1000 mL via INTRAVENOUS

## 2014-12-10 NOTE — Assessment & Plan Note (Signed)
She tolerated treatment very poorly. I suspected that this is because of significant deconditioning and chronic nausea and vomiting causing dehydration. Since we began IV fluids daily, she has improved. We will continue the same this week. I will proceed to restart her treatment next week without dose adjustment. But in addition, I will add IV fluids on the day of treatment.

## 2014-12-10 NOTE — Progress Notes (Signed)
[place] OFFICE PROGRESS NOTE  Patient Care Team: Carlena Sax, MD as PCP - General (Internal Medicine)  SUMMARY OF ONCOLOGIC HISTORY: Oncology History    M-protein 0.69 gm/dl IFIX - IgG, Kappa IgG - 868 IgA - 19 IgM - < 20 Kappa - 21 Lambda - 5.7  09/06/2014 - Bone marrow aspirate and biopsy:   Normocellular marrow for age (40%) with a small monoclonal plasma cell population (1% on aspirate). Karyotype 31, XX  FISH Negative for myeloma associated changes  09/12/2014 - PET/CT  Two regions that are concerning for disease, one adjacent/involving the left ninth rib and one in the marrow of the right femur, in this patient with history of plasmacytoma.      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    INTERVAL HISTORY: Please see below for problem oriented charting. She returns today for further follow-up. Overall, she still had persistent nausea but improving. She had no vomiting. Her appetite is stable. She have good bowel movement several days ago and she felt overall  better.  REVIEW OF SYSTEMS:   Constitutional: Denies fevers, chills or abnormal weight loss Eyes: Denies blurriness of vision Ears, nose, mouth, throat, and face: Denies mucositis or sore throat Respiratory: Denies cough, dyspnea or wheezes Cardiovascular: Denies palpitation, chest discomfort or lower extremity swelling Skin: Denies abnormal skin rashes Lymphatics: Denies new lymphadenopathy or easy bruising Neurological:Denies numbness, tingling or new weaknesses Behavioral/Psych: Mood is stable, no new changes  All other systems were reviewed with the patient and are negative.  I have reviewed the past medical history, past surgical history, social history and family history with the patient and they are unchanged from previous note.  ALLERGIES:  is allergic to codeine and ibuprofen.  MEDICATIONS:  Current Outpatient Prescriptions  Medication Sig Dispense Refill  . acyclovir (ZOVIRAX) 400 MG tablet Take 1 tablet (400 mg total) by mouth 2 (two) times daily. 60 tablet 3  . aspirin 81 MG tablet Take 81 mg by mouth daily.    . cholecalciferol (VITAMIN D) 1000 UNITS tablet Take 1,000 Units by mouth daily.    Marland Kitchen dexamethasone (DECADRON) 4 MG tablet Take 5 tablets (20 mg total) by mouth once a week. 80 tablet 3  . Glycerin, Laxative, 1 G SUPP Place 1 suppository (1 g total) rectally daily. 12 suppository 3  . lenalidomide (REVLIMID) 15 MG capsule Take 1 cap daily for 14 days, rest 7 days 14 capsule 0  . omeprazole (PRILOSEC) 20 MG capsule Take 1 capsule (20 mg total) by mouth daily. 30 capsule  6  . ondansetron (ZOFRAN) 8 MG tablet Take 8 mg by mouth every 8 (eight) hours as needed for nausea or vomiting.    . ondansetron (ZOFRAN-ODT) 8 MG disintegrating tablet   0  . oxycodone (OXY-IR) 5 MG capsule Take 1 capsule (5 mg total) by mouth every 4 (four) hours as needed. 60 capsule 0  . prochlorperazine (COMPAZINE) 10 MG tablet Take 1 tablet (10 mg total) by mouth every 6 (six) hours as needed  (Nausea or vomiting). 30 tablet 1  . ranitidine (ZANTAC) 75 MG tablet Take 1 tablet (75 mg total) by mouth at bedtime. 30 tablet 6  . senna (SENOKOT) 8.6 MG tablet Take 2 tablets (17.2 mg total) by mouth 3 (three) times daily. 180 tablet 3   No current facility-administered medications for this visit.   Facility-Administered Medications Ordered in Other Visits  Medication Dose Route Frequency Provider Last Rate Last Dose  . 0.9 %  sodium chloride infusion  1,000 mL Intravenous Once [name], MD 500 mL/hr at 12/10/14 1510 1,000 mL at 12/10/14 1510    PHYSICAL EXAMINATION: ECOG PERFORMANCE STATUS: 1 - Symptomatic but completely ambulatory  Filed Vitals:   12/10/14 1346  BP: 139/70  Pulse: 70  Temp: 98.1 F (36.7 C)  Resp: 18   Filed Weights   12/10/14 1346  Weight: 130 lb 12.8 oz (59.33 kg)    GENERAL:alert, no distress and comfortable. She looks mildly cushingoid. SKIN: skin color, texture, turgor are normal, no rashes or significant lesions EYES: normal, Conjunctiva are pink and non-injected, sclera clear Musculoskeletal:no cyanosis of digits and no clubbing  NEURO: alert & oriented x 3 with fluent speech, no focal motor/sensory deficits  LABORATORY DATA:  I have reviewed the data as listed    Component Value Date/Time   NA 139 12/02/2014 1513   K 3.8 12/02/2014 1513   CO2 20* 12/02/2014 1513   GLUCOSE 91 12/02/2014 1513   BUN 24.2 12/02/2014 1513   CREATININE 1.6* 12/02/2014 1513   CALCIUM 9.8 12/02/2014 1513   PROT 7.4 12/02/2014 1513   ALBUMIN 3.5 12/02/2014 1513   AST 38* 12/02/2014 1513   ALT 46 12/02/2014 1513   ALKPHOS 216* 12/02/2014 1513   BILITOT 0.29 12/02/2014 1513    No results found for: SPEP, UPEP  Lab Results  Component Value Date   WBC 6.8 12/02/2014   NEUTROABS 4.2 12/02/2014   HGB 12.6 12/02/2014   HCT 38.9 12/02/2014   MCV 90.3 12/02/2014   PLT 170 12/02/2014      Chemistry      Component Value Date/Time   NA 139 12/02/2014 1513    K 3.8 12/02/2014 1513   CO2 20* 12/02/2014 1513   BUN 24.2 12/02/2014 1513   CREATININE 1.6* 12/02/2014 1513      Component Value Date/Time   CALCIUM 9.8 12/02/2014 1513   ALKPHOS 216* 12/02/2014 1513   AST 38* 12/02/2014 1513   ALT 46 12/02/2014 1513   BILITOT 0.29 12/02/2014 1513     ASSESSMENT & PLAN:  Multiple myeloma She tolerated treatment very poorly. I suspected that this is because of significant deconditioning and chronic nausea and vomiting causing dehydration. Since we began IV fluids daily, she has improved. We will continue the same this week. I will proceed to restart her treatment next week without dose adjustment. But in addition, I will add IV fluids on the day of treatment.   Chemotherapy induced nausea and vomiting The cause of her nausea & vomiting is  multifactorial, likely due to steroid withdrawal, possible severe gastritis from chronic steroid exposure, and is exacerbated by recent constipation, dehydration, side effects of narcotics and possibly anticipatory nausea. I recommend daily IV fluids, regular anti-emetics,  aggressive management of constipation and close follow-up visit. I will also start her on proton pump inhibitor in the morning and Zantac at nighttime for possible component of gastritis Since we started this aggressive strategy last week, clinically she is improving. We will continue IV fluid for the rest of the week.    Other constipation I recommend aggressive laxative therapy. Since her constipation resolved, she is feeling better. I recommend she continues on aggressive laxative therapy.     No orders of the defined types were placed in this encounter.   All questions were answered. The patient knows to call the clinic with any problems, questions or concerns. No barriers to learning was detected. I spent 15 minutes counseling the patient face to face. The total time spent in the appointment was 20 minutes and more than 50% was  on counseling and review of test results     [facility], [place], MD 12/10/2014 3:30 PM

## 2014-12-10 NOTE — Patient Instructions (Signed)
Dehydration, Adult Dehydration is when you lose more fluids from the body than you take in. Vital organs like the kidneys, brain, and heart cannot function without a proper amount of fluids and salt. Any loss of fluids from the body can cause dehydration.  CAUSES   Vomiting.  Diarrhea.  Excessive sweating.  Excessive urine output.  Fever. SYMPTOMS  Mild dehydration  Thirst.  Dry lips.  Slightly dry mouth. Moderate dehydration  Very dry mouth.  Sunken eyes.  Skin does not bounce back quickly when lightly pinched and released.  Dark urine and decreased urine production.  Decreased tear production.  Headache. Severe dehydration  Very dry mouth.  Extreme thirst.  Rapid, weak pulse (more than 100 beats per minute at rest).  Cold hands and feet.  Not able to sweat in spite of heat and temperature.  Rapid breathing.  Blue lips.  Confusion and lethargy.  Difficulty being awakened.  Minimal urine production.  No tears. DIAGNOSIS  Your caregiver will diagnose dehydration based on your symptoms and your exam. Blood and urine tests will help confirm the diagnosis. The diagnostic evaluation should also identify the cause of dehydration. TREATMENT  Treatment of mild or moderate dehydration can often be done at home by increasing the amount of fluids that you drink. It is best to drink small amounts of fluid more often. Drinking too much at one time can make vomiting worse. Refer to the home care instructions below. Severe dehydration needs to be treated at the hospital where you will probably be given intravenous (IV) fluids that contain water and electrolytes. HOME CARE INSTRUCTIONS   Ask your caregiver about specific rehydration instructions.  Drink enough fluids to keep your urine clear or pale yellow.  Drink small amounts frequently if you have nausea and vomiting.  Eat as you normally do.  Avoid:  Foods or drinks high in sugar.  Carbonated  drinks.  Juice.  Extremely hot or cold fluids.  Drinks with caffeine.  Fatty, greasy foods.  Alcohol.  Tobacco.  Overeating.  Gelatin desserts.  Wash your hands well to avoid spreading bacteria and viruses.  Only take over-the-counter or prescription medicines for pain, discomfort, or fever as directed by your caregiver.  Ask your caregiver if you should continue all prescribed and over-the-counter medicines.  Keep all follow-up appointments with your caregiver. SEEK MEDICAL CARE IF:  You have abdominal pain and it increases or stays in one area (localizes).  You have a rash, stiff neck, or severe headache.  You are irritable, sleepy, or difficult to awaken.  You are weak, dizzy, or extremely thirsty. SEEK IMMEDIATE MEDICAL CARE IF:   You are unable to keep fluids down or you get worse despite treatment.  You have frequent episodes of vomiting or diarrhea.  You have blood or green matter (bile) in your vomit.  You have blood in your stool or your stool looks black and tarry.  You have not urinated in 6 to 8 hours, or you have only urinated a small amount of very dark urine.  You have a fever.  You faint. MAKE SURE YOU:   Understand these instructions.  Will watch your condition.  Will get help right away if you are not doing well or get worse. Document Released: 07/26/2005 Document Revised: 10/18/2011 Document Reviewed: 03/15/2011 Exit[employer] Patient Information 2015 [employer]. This information is not intended to replace advice given to you by your health care provider. Make sure you discuss any questions you have with your health care   provider.  

## 2014-12-10 NOTE — Telephone Encounter (Signed)
Received fax from [facility] shipped 12/09/14

## 2014-12-10 NOTE — Assessment & Plan Note (Signed)
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 I recommend daily IV fluids, regular anti-emetics,  aggressive management of constipation and close follow-up visit. I will also start her on proton pump inhibitor in the morning and Zantac at nighttime for possible component of gastritis Since we started this aggressive strategy last week, clinically she is improving. We will continue IV fluid for the rest of the week.

## 2014-12-10 NOTE — Telephone Encounter (Signed)
Pt confirmed labs/ov per 05/03 POF, gave pt AVS and Calendar.[name] , sent msg to add chemo

## 2014-12-10 NOTE — Assessment & Plan Note (Signed)
I recommend aggressive laxative therapy. Since her constipation resolved, she is feeling better. I recommend she continues on aggressive laxative therapy.

## 2014-12-11 ENCOUNTER — Ambulatory Visit (HOSPITAL_BASED_OUTPATIENT_CLINIC_OR_DEPARTMENT_OTHER): Payer: Medicare Other

## 2014-12-11 VITALS — BP 129/67 | HR 65 | Temp 97.5°F | Resp 18

## 2014-12-11 DIAGNOSIS — C9000 Multiple myeloma not having achieved remission: Secondary | ICD-10-CM

## 2014-12-11 DIAGNOSIS — R112 Nausea with vomiting, unspecified: Secondary | ICD-10-CM | POA: Diagnosis not present

## 2014-12-11 DIAGNOSIS — N19 Unspecified kidney failure: Secondary | ICD-10-CM

## 2014-12-11 DIAGNOSIS — E860 Dehydration: Secondary | ICD-10-CM | POA: Diagnosis not present

## 2014-12-11 MED ORDER — SODIUM CHLORIDE 0.9 % IV SOLN
Freq: Once | INTRAVENOUS | Status: AC
  Administered 2014-12-11: 13:00:00 via INTRAVENOUS

## 2014-12-11 NOTE — Patient Instructions (Signed)

## 2014-12-12 ENCOUNTER — Telehealth: Payer: Self-pay | Admitting: *Deleted

## 2014-12-12 ENCOUNTER — Ambulatory Visit: Payer: Medicare Other

## 2014-12-12 NOTE — Telephone Encounter (Signed)
Pt states she has "vertigo" today and feels dizzy every time she stands up.  States she has had vertigo in the past and feels the same. She does not want to come in for IVFs today but will try to make it tomorrow.  Informed pt that dehydration can also cause dizziness so try to make her appt tomorrow and call us if she gets any worse.  Pt verbalized understanding.  Dr. Alvy Bimler notified.

## 2014-12-13 ENCOUNTER — Ambulatory Visit (HOSPITAL_BASED_OUTPATIENT_CLINIC_OR_DEPARTMENT_OTHER): Payer: Medicare Other

## 2014-12-13 VITALS — BP 143/74 | HR 56 | Temp 98.8°F | Resp 18

## 2014-12-13 DIAGNOSIS — E860 Dehydration: Secondary | ICD-10-CM | POA: Diagnosis not present

## 2014-12-13 DIAGNOSIS — N19 Unspecified kidney failure: Secondary | ICD-10-CM

## 2014-12-13 DIAGNOSIS — R112 Nausea with vomiting, unspecified: Secondary | ICD-10-CM | POA: Diagnosis not present

## 2014-12-13 MED ORDER — SODIUM CHLORIDE 0.9 % IV SOLN
Freq: Once | INTRAVENOUS | Status: AC
  Administered 2014-12-13: 13:00:00 via INTRAVENOUS

## 2014-12-13 MED ORDER — PROMETHAZINE HCL 25 MG/ML IJ SOLN
25.0000 mg | Freq: Once | INTRAMUSCULAR | Status: AC
  Administered 2014-12-13: 25 mg via INTRAVENOUS
  Filled 2014-12-13: qty 1

## 2014-12-13 NOTE — Patient Instructions (Signed)

## 2014-12-15 ENCOUNTER — Emergency Department (HOSPITAL_BASED_OUTPATIENT_CLINIC_OR_DEPARTMENT_OTHER): Payer: Medicare Other

## 2014-12-15 ENCOUNTER — Emergency Department (HOSPITAL_COMMUNITY)
Admission: EM | Admit: 2014-12-15 | Discharge: 2014-12-15 | Disposition: A | Discharge status: Home / Self Care | Payer: Medicare Other | Attending: Emergency Medicine | Admitting: Emergency Medicine

## 2014-12-15 ENCOUNTER — Encounter (HOSPITAL_COMMUNITY): Payer: Self-pay | Admitting: Emergency Medicine

## 2014-12-15 DIAGNOSIS — Z7982 Long term (current) use of aspirin: Secondary | ICD-10-CM | POA: Diagnosis not present

## 2014-12-15 DIAGNOSIS — Z8579 Personal history of other malignant neoplasms of lymphoid, hematopoietic and related tissues: Secondary | ICD-10-CM | POA: Diagnosis not present

## 2014-12-15 DIAGNOSIS — Z79899 Other long term (current) drug therapy: Secondary | ICD-10-CM | POA: Insufficient documentation

## 2014-12-15 DIAGNOSIS — M7989 Other specified soft tissue disorders: Secondary | ICD-10-CM

## 2014-12-15 DIAGNOSIS — I82622 Acute embolism and thrombosis of deep veins of left upper extremity: Secondary | ICD-10-CM | POA: Diagnosis not present

## 2014-12-15 DIAGNOSIS — I82612 Acute embolism and thrombosis of superficial veins of left upper extremity: Secondary | ICD-10-CM

## 2014-12-15 DIAGNOSIS — K5900 Constipation, unspecified: Secondary | ICD-10-CM | POA: Diagnosis not present

## 2014-12-15 DIAGNOSIS — E876 Hypokalemia: Secondary | ICD-10-CM

## 2014-12-15 LAB — CBC WITH DIFFERENTIAL/PLATELET
Basophils Absolute: 0 10*3/uL (ref 0.0–0.1)
Basophils Relative: 0 % (ref 0–1)
Eosinophils Absolute: 0.3 10*3/uL (ref 0.0–0.7)
Eosinophils Relative: 5 % (ref 0–5)
HCT: 29.9 % — ABNORMAL LOW (ref 36.0–46.0)
Hemoglobin: 9.8 g/dL — ABNORMAL LOW (ref 12.0–15.0)
Lymphocytes Relative: 18 % (ref 12–46)
Lymphs Abs: 1.2 10*3/uL (ref 0.7–4.0)
MCH: 29.1 pg (ref 26.0–34.0)
MCHC: 32.8 g/dL (ref 30.0–36.0)
MCV: 88.7 fL (ref 78.0–100.0)
Monocytes Absolute: 1.2 10*3/uL — ABNORMAL HIGH (ref 0.1–1.0)
Monocytes Relative: 18 % — ABNORMAL HIGH (ref 3–12)
Neutro Abs: 4 10*3/uL (ref 1.7–7.7)
Neutrophils Relative %: 59 % (ref 43–77)
Platelets: 388 10*3/uL (ref 150–400)
RBC: 3.37 MIL/uL — ABNORMAL LOW (ref 3.87–5.11)
RDW: 14 % (ref 11.5–15.5)
WBC: 6.8 10*3/uL (ref 4.0–10.5)

## 2014-12-15 LAB — BASIC METABOLIC PANEL
Anion gap: 9 (ref 5–15)
BUN: 5 mg/dL — ABNORMAL LOW (ref 6–20)
CO2: 25 mmol/L (ref 22–32)
Calcium: 8.1 mg/dL — ABNORMAL LOW (ref 8.9–10.3)
Chloride: 108 mmol/L (ref 101–111)
Creatinine, Ser: 0.75 mg/dL (ref 0.44–1.00)
GFR calc Af Amer: >60 mL/min (ref >60)
GFR calc non Af Amer: >60 mL/min (ref >60)
Glucose, Bld: 81 mg/dL (ref 70–99)
Potassium: 2.6 mmol/L — CL (ref 3.5–5.1)
Sodium: 142 mmol/L (ref 135–145)

## 2014-12-15 MED ORDER — ONDANSETRON 4 MG PO TBDP
4.0000 mg | ORAL_TABLET | Freq: Once | ORAL | Status: AC
  Administered 2014-12-15: 4 mg via ORAL
  Filled 2014-12-15: qty 1

## 2014-12-15 MED ORDER — ONDANSETRON HCL 4 MG PO TABS: 4.0000 mg | ORAL_TABLET | Freq: Four times a day (QID) | ORAL | Status: DC

## 2014-12-15 MED ORDER — POTASSIUM CHLORIDE CRYS ER 20 MEQ PO TBCR
40.0000 meq | EXTENDED_RELEASE_TABLET | Freq: Once | ORAL | Status: AC
  Administered 2014-12-15: 40 meq via ORAL
  Filled 2014-12-15: qty 2

## 2014-12-15 NOTE — ED Notes (Signed)
Pt given po fluids, aware that we are waiting for oncology consult.

## 2014-12-15 NOTE — ED Provider Notes (Signed)
CSN: [id number]     Arrival date & time 12/15/14  1701 History   First MD Initiated Contact with Patient 12/15/14 1706     Chief Complaint  Patient presents with  . Arm Swelling  . Nausea     (Consider location/radiation/quality/duration/timing/severity/associated sxs/prior Treatment) The history is provided by the patient and the spouse. No language interpreter was used.  Ms. Fraiser is a 64 y.o female with a history of mitral valve prolapse and multiple myeloma who presents with new onset left arm pain and swelling that began yesterday and worsened today. She has had daily IV fluid and phenergan infusions in both arms due to intolerance to chemotherapy medication and dehydration. She does not take anticoagulants. She has no history of dvt. She denies any injury, fever, chest pain, nausea, vomiting, or lower extremity swelling.   Oncologist: Dr. Alvy Bimler Past Medical History  Diagnosis Date  . MVP (mitral valve prolapse)     req prophylaxis  . Multiple myeloma 11/19/2014  . Other constipation 12/03/2014  . Multiple myeloma   . Multiple myeloma    Past Surgical History  Procedure Laterality Date  . Laser ablation of condylomas  1990    CIN 1 cervix  . Cataract extraction, bilateral  10/2010    Implants(ReSTOR) Bilat   Family History  Problem Relation Age of Onset  . Osteoporosis Mother    History  Substance Use Topics  . Smoking status: Never Smoker   . Smokeless tobacco: Never Used  . Alcohol Use: No   OB History    Gravida Para Term Preterm AB TAB SAB Ectopic Multiple Living   1 1        1      Review of Systems  Constitutional: Negative for fever.  Gastrointestinal: Positive for nausea.  Allergic/Immunologic: Positive for immunocompromised state.  Neurological: Negative for dizziness.  All other systems reviewed and are negative.     Allergies  Codeine and Ibuprofen  Home Medications   Prior to Admission medications   Medication Sig Start Date End Date Taking?  Authorizing Provider  acyclovir (ZOVIRAX) 400 MG tablet Take 1 tablet (400 mg total) by mouth 2 (two) times daily. 11/19/14  Yes [name], MD  aspirin EC 81 MG tablet Take 81 mg by mouth daily with breakfast.   Yes Historical Provider, MD  Cholecalciferol (VITAMIN D) 2000 UNITS tablet Take 2,000 Units by mouth daily with breakfast.   Yes Historical Provider, MD  dexamethasone (DECADRON) 4 MG tablet Take 5 tablets (20 mg total) by mouth once a week. Patient taking differently: Take 20 mg by mouth every Tuesday.  11/19/14  Yes [name], MD  Glycerin, Laxative, 1 G SUPP Place 1 suppository (1 g total) rectally daily. Patient taking differently: Place 1 suppository rectally daily as needed (for constipation).  12/03/14  Yes [name], MD  lenalidomide (REVLIMID) 15 MG capsule Take 1 cap daily for 14 days, rest 7 days 11/21/14  Yes [name], MD  omeprazole (PRILOSEC) 20 MG capsule Take 1 capsule (20 mg total) by mouth daily. 12/03/14  Yes [name], MD  ondansetron (ZOFRAN-ODT) 8 MG disintegrating tablet Take 8 mg by mouth every 8 (eight) hours as needed for nausea or vomiting.  12/01/14  Yes Historical Provider, MD  oxycodone (OXY-IR) 5 MG capsule Take 1 capsule (5 mg total) by mouth every 4 (four) hours as needed. Patient taking differently: Take 5 mg by mouth every 4 (four) hours as needed for pain.  11/19/14  Yes Ni  Alvy Bimler, MD  prochlorperazine (COMPAZINE) 10 MG tablet Take 1 tablet (10 mg total) by mouth every 6 (six) hours as needed (Nausea or vomiting). 11/19/14  Yes [name], MD  senna (SENOKOT) 8.6 MG tablet Take 2 tablets (17.2 mg total) by mouth 3 (three) times daily. Patient taking differently: Take 1 tablet by mouth at bedtime as needed for constipation.  12/03/14  Yes [name], MD  ondansetron (ZOFRAN) 4 MG tablet Take 1 tablet (4 mg total) by mouth every 6 (six) hours. 12/15/14   Catricia Scheerer Patel-Mills, PA-C  ranitidine (ZANTAC) 75 MG tablet Take 1 tablet (75 mg total) by mouth at  bedtime. Patient not taking: Reported on 12/15/2014 12/03/14   [name], MD   BP 147/88 mmHg  Pulse 78  Temp(Src) 98.8 F (37.1 C) (Oral)  Resp 18  SpO2 99%  LMP 08/10/2003 Physical Exam  Constitutional: She is oriented to person, place, and time. She appears well-developed and well-nourished.  HENT:  Head: Normocephalic and atraumatic.  Eyes: Conjunctivae are normal.  Cardiovascular: Normal rate, regular rhythm and normal heart sounds.   Pulmonary/Chest: Effort normal and breath sounds normal.  Musculoskeletal:  Left arm: Edema, erythema, warmth, and tenderness from the mid arm to the distal forearm. Cap refill <2 seconds.  Good radial pulse. Good grip strength.  Normal ROM of left arm, wrist and hand. Able to flex and extend arm without difficulty.   Neurological: She is alert and oriented to person, place, and time.  Skin: Skin is warm and dry.  Nursing note and vitals reviewed.   ED Course  Procedures (including critical care time) Labs Review Labs Reviewed  CBC WITH DIFFERENTIAL/PLATELET - Abnormal; Notable for the following:    RBC 3.37 (*)    Hemoglobin 9.8 (*)    HCT 29.9 (*)    Monocytes Relative 18 (*)    Monocytes Absolute 1.2 (*)    All other components within normal limits  BASIC METABOLIC PANEL - Abnormal; Notable for the following:    Potassium 2.6 (*)    BUN 5 (*)    Calcium 8.1 (*)    All other components within normal limits    Imaging Review No results found.   EKG Interpretation None      MDM   Final diagnoses:  Thrombosis superficial vein, arm, acute, left  Patient presents for left arm swelling and tenderness. She has a good radial pulse and cap refill.  She was seen on 5/3 by her oncologist, Dr.  Who states that she tolerated treatment very poorly with chronic nausea and vomiting causing dehydration. He thought that her nausea and vomiting was multifactorial due to possible gastritis, steroid exposure, recent constipation, dehydration, and  narcotics. He had ordered IV fluids and phenergan daily for which she has been going to [facility] for and her last infusion was on Thursday.  My concern is a dvt vs. Cellulitis.   18:26 Lab called with potassium results.   19:37 Doppler tech verbally stated the results.  She is positive for occlusive superficial thrombus of the basilic vein from the mid forarm to the mid upper arm at the confluence of the brachial vein.  She also has a thrombus of the cephalic vein from the mid forearm to above the [facility].  I spoke to Dr. Regenia Skeeter about this patient and he has seen the patient.   I spoke to Dr. Alen Blew from oncology who recommended that the patient be put on an Nsaid or daily aspirin but no anticoagulation medications.  I spoke to the patient who states Nsaids upset her stomach.  She is already having problems with nausea and has an appointment on Tuesday with oncology.  I told her to increase her dose of aspirin to two 10m tablets since she is on 887ms daily.  She agrees with the plan and will see her physician on Tuesday.     HaOttie GlazierPA-C 12/15/14 2329  ScSherwood GamblerMD 12/19/14 [phone]

## 2014-12-15 NOTE — ED Notes (Signed)
Pt refused zofran prior to d/c and states she wanted to wait until she returned home before taking additional meds. Pt's L arm wrapped with gauze and ace wrap. D/c instructions given.

## 2014-12-15 NOTE — ED Notes (Signed)
Pt resting with family at bedside. Pt awaiting venous study.

## 2014-12-15 NOTE — ED Notes (Signed)
Pt with stiffness and warmth to L arm s/p IV removal 12/14/2014 1500 for cancer treatment, L arm became red and increasing edema noted to arm. Pt c/o chronic nausea.Pt being treated for multiple myeloma.

## 2014-12-15 NOTE — Progress Notes (Signed)
VASCULAR LAB PRELIMINARY  PRELIMINARY  PRELIMINARY  PRELIMINARY  Left upper extremity venous duplex completed.    Preliminary report:  Negative for deep vein thrombosis of the left upper extremity. Positive for occlusive superficial thrombus of the basilic vein coursing from the mid forearm to the mid upper arm at the confluence with the brachial vein. Also noted is a positive thrombus of the cephalic vein coursing from the  mid forearm to just above the [facility], Arthi Mcdonald, RVS 12/15/2014, 7:30 PM

## 2014-12-15 NOTE — ED Notes (Signed)
Pt changed her mind and decided to take Zofran prior to d/c.

## 2014-12-15 NOTE — Discharge Instructions (Signed)
Venous Thromboembolism Take two baby aspirin daily.  Follow up with your primary care physician.  Venous thromboembolism (VTE) is a condition where a blood clot (thrombus) develops in the body. A thrombus usually occurs in a deep vein in the leg or pelvis but can occur in an upper extremity. Sometimes pieces of the thrombus can break off from its original place of development and travel through the bloodstream to other parts of the body. When a thrombus breaks off and travels through the bloodstream, it is called an embolism. The embolism can block the blood flow in the blood vessels of other organs. There are two serious types of VTE:  Deep vein thrombosis (DVT). A DVT is a thrombus that usually occurs in a deep vein of the lower legs, pelvis, or in an upper extremity.  Pulmonary embolism (PE). A PE occurs when an embolism has formed and traveled to the lungs. A PE can block or decrease the blood flow in one or both lungs. Venous thromboembolism is a serious health condition that can cause disability or death. It is very important to not ignore symptoms or delay treatment.   CAUSES   A blood clot can form in a vein from different conditions. A blood clot can develop due to:  Blood flow within a vein that is sluggish or very slow.  Medical conditions that make the blood clot easily.  Vein damage. RISK FACTORS Risk factors can increase your risk of developing a blood clot. Risk factors can include:  Smoking.  Obesity.  Age.  Immobility or sedentary lifestyle.  Sitting or standing for long periods of time.  Chronic or long-term bedrest.  Medical or past history of blood clots.  Family history of blood clots.  Hip, leg, or pelvis injury or trauma.  Major surgery, especially surgery on the hip, knee, or abdomen.  Pregnancy and childbirth.  Birth control pills and hormone replacement therapy.  Medical conditions such as  Peripheral vascular disease  (PVD).  Diabetes.  Cancer. SIGNS AND SYMPTOMS  Symptoms of VTE can depend on where the clot is located and if the clot breaks off and travels to another organ. Sometimes, there may be no symptoms.   DVT symptoms can include:  Swelling of the leg or arm, especially on one side.  Warmth and redness of the leg or arm, especially on one side.  Pain in an arm or leg. Leg pain may be more noticeable or worse when standing or walking.  PE symptoms can include:  Shortness of breath.  Coughing.  Coughing up blood or blood-tinged mucus (hemoptysis).  Chest pain or chest pain with deep breaths (pleuritic chest pain).  Apprehension, anxiety, or a feeling of impending doom.  Rapid heartbeat. A PE is a medical emergency. Call your local emergency services (911 in [place] if you have these symptoms. DIAGNOSIS  A venous thromboembolism is diagnosed by:  Looking at your medical history and risk factors. Your health care provider will perform a physical exam.  Blood tests, including blood work of how your blood clots.  Imaging tests that can detect a blood clot may be ordered. These can include:  Ultrasonography.  Computed Tomography (CT) scan.  Magnetic Resonance Imaging (MRI).  Echocardiography.  Electrocardiography. TREATMENT  Initial treatment: When a venous thromboembolism has been confirmed, initial treatment consists of using blood-thinning (anticoagulant) medicines. Anticoagulant medicines affect how your blood clots and can cause bleeding. Therefore, when you are on anticoagulants, your blood clotting times are monitored by blood tests called  prothrombin time (PT) and International Normalized Ratio (INR). Typically, the anticoagulants are intravenous (IV) heparin and warfarin. IV heparin is normally started right away because it has a rapid onset of action and thins the blood quickly. Warfarin is also started with IV heparin therapy. Warfarin has a slower onset of action and  takes longer to work. This overlap of IV heparin and oral warfarin therapy is continued until PT and INR levels are therapeutic. After the PT and INR levels are therapeutic, IV heparin is discontinued and you are maintained on warfarin.  Other treatment options:  Catheter-directed thrombolysis. This is a clot-busting therapy for a DVT in which a small, flexible hollow tube (catheter) is threaded to the blood clot inside the vein. A clot-busting drug (thrombolytic) is then infused through the catheter. The thrombolytic helps to break up the clot in the vein and restore blood flow.  Direct thrombin inhibitor (DTI) medicine. A DTI is an anticoagulant that slows blood clotting. It is given through an IV.  If you cannot take an anticoagulant, a filter called an inferior vena cava filter (IVC filter) can be placed. The IVC filter is placed in a large vein, in either your leg or abdomen. An IVC filter is left in the vein permanently. The IVC filter can help prevent blood clots from going to your lungs.  Surgery. Blood clots may need to be removed surgically if other treatment options are not working or cannot be used. Types of surgery can include:  Thrombectomy.  Embolectomy.  Venous stenting.  Pain medicine (analgesic). Medicine to control pain is given in addition to the above treatment options. Home treatment:  Continued treatment at home consists of taking either warfarin or under-the-skin (subcutaneous) injections of an anticoagulant. HOME CARE INSTRUCTIONS   Take medicines only as directed by your health care provider. Follow the directions carefully.  Warfarin. Most people will continue taking warfarin. Your health care provider will advise you on the length of treatment (usually 3 to 6 months, sometimes lifelong).  Too much and too little warfarin are both dangerous. Too much warfarin increases the risk of bleeding. Too little warfarin continues to allow the risk for blood clots. While  taking warfarin, you will need to have regular blood tests to measure your blood clotting time. These blood tests usually include both the PT and INR tests. The PT and INR results allow your health care provider to adjust your dose of warfarin. The dose can change for many reasons. It is critically important that you take warfarin exactly as prescribed, and that you have your PT and INR levels drawn exactly as directed.  Many foods, especially foods high in vitamin K can interfere with warfarin and affect the PT and INR results. Foods high in vitamin K include spinach, kale, broccoli, cabbage, collard and turnip greens, Brussels sprouts, peas, cauliflower, seaweed, and parsley as well as beef and pork liver, green tea, and soybean oil. You should eat a consistent amount of foods high in vitamin K. Avoid major changes in your diet, or notify your health care provider before changing your diet. Arrange a visit with a dietitian to answer your questions.  Many medicines can interfere with warfarin and affect the PT and INR results. You must tell your health care provider about any and all medicines you take, including all vitamins and supplements. Be especially cautious with aspirin and anti-inflammatory medicines. Do not take or discontinue any prescribed or over-the-counter medicine except on the advice of your health care provider  or pharmacist.  Warfarin can have side effects, such as excessive bruising or bleeding. You will need to hold pressure over cuts for longer than usual. Your health care provider or pharmacist will discuss other potential side effects.  Avoid sports or activities that may cause injury or bleeding.  Be mindful when shaving, flossing your teeth, or handling sharp objects.  Alcohol can change the body's ability to handle warfarin. It is best to avoid alcoholic drinks or consume only very small amounts while taking warfarin. Notify your health care provider if you change your alcohol  intake.  Notify your dentist or other health care providers before procedures.  Activity. Ask your health care provider how soon you can go back to normal activities if you have had a blood clot.  Exercise. It is very important to exercise and stay active to prevent future blood clots. This is especially important while traveling, sitting, or standing for long periods of time. Exercise your legs by walking or by pumping the muscles frequently.  Compression stockings. You may need to wear compression stockings to help prevent a DVT.  Smoking. If you smoke, quit. Ask your health care provider for help with quitting smoking.  Learn as much as you can about VTE. Educating yourself can help prevent VTE from reoccurring.  Wear a medical alert bracelet or carry a medical alert card. PREVENTION   In-hospital prevention:  Activity. Getting out of bed and walking while you are in the hospital can help prevent blood clots.  Medicines may be given to help prevent blood clots.  Sequential compression device (SCD). A SCD can help prevent blood clots in the lower legs. A compression sleeve is wrapped around each of your legs. The tubing of the sleeve is connected to a machine that pumps air into the compression sleeve. The pumping action of the sleeve helps circulate the blood in your legs.  Compression stockings. Compression stockings are tight, elastic stockings that apply pressure to the lower legs and help prevent blood from pooling in the lower legs. Compression stockings are sometimes used with SCDs.  General prevention:  Exercise regularly if you are able.  Avoid sitting or lying in bed for long periods of time without moving the legs.  Do not smoke. If you smoke, quit. Ask your health care provider for help.  Avoid exposure to smoke.  Maintain a healthy weight.  Women over the age of 54 should consider the risk of blood clots while taking birth control pills or hormone replacement  therapy.  Long distance travel along with prolonged sitting and standing can increase the risk of a DVT. Exercise your legs by walking or by pumping your leg muscles every hour. SEEK MEDICAL CARE IF:   You feel faint or dizzy.  You feel rapid or skipped heartbeats.  You feel weaker or more tired than usual.  You feel you are not getting better in the time expected.  You believe you are having side effects from medicine.  You have joint pain.  You have abdominal pain.  You have new or increased bruising. SEEK IMMEDIATE MEDICAL CARE IF:   You vomit bright red blood or your vomit has a coffee ground type appearance.  You have bowel movements that have bright red blood or are dark or tarry in appearance.  You have bleeding from your rectum.  You have pink or bloody urine.  You develop breathing problems such as shortness of breath or pain with breathing.  You are coughing up blood.  You develop warmth, swelling, or redness in an arm or a leg.  You have chest pain.  You have a sudden, unexplained severe headache.  You have a cut that does not stop bleeding after 10 minutes.  You have a nosebleed that does not stop bleeding after 10 minutes. Document Released: 05/23/2009 Document Revised: 12/10/2013 Document Reviewed: 01/05/2012 [facility] Patient Information 2015 [place], [place]. This information is not intended to replace advice given to you by your health care provider. Make sure you discuss any questions you have with your health care provider.

## 2014-12-17 ENCOUNTER — Ambulatory Visit (HOSPITAL_BASED_OUTPATIENT_CLINIC_OR_DEPARTMENT_OTHER): Payer: Medicare Other

## 2014-12-17 ENCOUNTER — Other Ambulatory Visit: Payer: Self-pay | Admitting: Oncology

## 2014-12-17 ENCOUNTER — Other Ambulatory Visit (HOSPITAL_BASED_OUTPATIENT_CLINIC_OR_DEPARTMENT_OTHER): Payer: Medicare Other

## 2014-12-17 VITALS — BP 154/83 | HR 72 | Temp 98.5°F | Resp 18

## 2014-12-17 DIAGNOSIS — Z5112 Encounter for antineoplastic immunotherapy: Secondary | ICD-10-CM

## 2014-12-17 DIAGNOSIS — C9000 Multiple myeloma not having achieved remission: Secondary | ICD-10-CM

## 2014-12-17 LAB — COMPREHENSIVE METABOLIC PANEL (CC13)
ALT: 8 U/L (ref 0–55)
AST: 15 U/L (ref 5–34)
Albumin: 2.7 g/dL — ABNORMAL LOW (ref 3.5–5.0)
Alkaline Phosphatase: 114 U/L (ref 40–150)
Anion Gap: 12 mEq/L — ABNORMAL HIGH (ref 3–11)
BUN: 6.2 mg/dL — ABNORMAL LOW (ref 7.0–26.0)
CO2: 24 mEq/L (ref 22–29)
Calcium: 8.4 mg/dL (ref 8.4–10.4)
Chloride: 106 mEq/L (ref 98–109)
Creatinine: 1 mg/dL (ref 0.6–1.1)
EGFR: 63 mL/min/{1.73_m2} — ABNORMAL LOW (ref >90)
Glucose: 91 mg/dl (ref 70–140)
Potassium: 3.6 mEq/L (ref 3.5–5.1)
Sodium: 143 mEq/L (ref 136–145)
Total Bilirubin: 0.51 mg/dL (ref 0.20–1.20)
Total Protein: 6.2 g/dL — ABNORMAL LOW (ref 6.4–8.3)

## 2014-12-17 LAB — CBC WITH DIFFERENTIAL/PLATELET
BASO%: 1.1 % (ref 0.0–2.0)
Basophils Absolute: 0.1 10*3/uL (ref 0.0–0.1)
EOS%: 1.8 % (ref 0.0–7.0)
Eosinophils Absolute: 0.1 10*3/uL (ref 0.0–0.5)
HCT: 30.1 % — ABNORMAL LOW (ref 34.8–46.6)
HGB: 10.1 g/dL — ABNORMAL LOW (ref 11.6–15.9)
LYMPH%: 8.6 % — ABNORMAL LOW (ref 14.0–49.7)
MCH: 29 pg (ref 25.1–34.0)
MCHC: 33.6 g/dL (ref 31.5–36.0)
MCV: 86.5 fL (ref 79.5–101.0)
MONO#: 0.6 10*3/uL (ref 0.1–0.9)
MONO%: 7.9 % (ref 0.0–14.0)
NEUT#: 5.9 10*3/uL (ref 1.5–6.5)
NEUT%: 80.6 % — ABNORMAL HIGH (ref 38.4–76.8)
Platelets: 502 10*3/uL — ABNORMAL HIGH (ref 145–400)
RBC: 3.48 10*6/uL — ABNORMAL LOW (ref 3.70–5.45)
RDW: 13.8 % (ref 11.2–14.5)
WBC: 7.4 10*3/uL (ref 3.9–10.3)
lymph#: 0.6 10*3/uL — ABNORMAL LOW (ref 0.9–3.3)

## 2014-12-17 MED ORDER — BORTEZOMIB CHEMO SQ INJECTION 3.5 MG (2.5MG/ML)
1.3000 mg/m2 | Freq: Once | INTRAMUSCULAR | Status: AC
  Administered 2014-12-17: 2 mg via SUBCUTANEOUS
  Filled 2014-12-17: qty 2

## 2014-12-17 MED ORDER — ONDANSETRON HCL 8 MG PO TABS
8.0000 mg | ORAL_TABLET | Freq: Once | ORAL | Status: AC
  Administered 2014-12-17: 8 mg via ORAL

## 2014-12-17 MED ORDER — ONDANSETRON HCL 8 MG PO TABS
ORAL_TABLET | ORAL | Status: AC
  Filled 2014-12-17: qty 1

## 2014-12-17 NOTE — Patient Instructions (Signed)
[facility] Discharge Instructions for Patients Receiving Chemotherapy  Today you received the following chemotherapy agents:  Velcade  To help prevent nausea and vomiting after your treatment, we encourage you to take your nausea medication as prescribed.   If you develop nausea and vomiting that is not controlled by your nausea medication, call the clinic.   BELOW ARE SYMPTOMS THAT SHOULD BE REPORTED IMMEDIATELY:  *FEVER GREATER THAN 100.5 F  *CHILLS WITH OR WITHOUT FEVER  NAUSEA AND VOMITING THAT IS NOT CONTROLLED WITH YOUR NAUSEA MEDICATION  *UNUSUAL SHORTNESS OF BREATH  *UNUSUAL BRUISING OR BLEEDING  TENDERNESS IN MOUTH AND THROAT WITH OR WITHOUT PRESENCE OF ULCERS  *URINARY PROBLEMS  *BOWEL PROBLEMS  UNUSUAL RASH Items with * indicate a potential emergency and should be followed up as soon as possible.  Feel free to call the clinic you have any questions or concerns. The clinic phone number is [phone].  Please show the CHEMO ALERT CARD at check-in to the Emergency Department and triage nurse.   

## 2014-12-17 NOTE — Progress Notes (Signed)
Dr. Alvy Bimler aware of ED visit on 12/15/14. Okay to proceed with treatment today.

## 2014-12-18 ENCOUNTER — Other Ambulatory Visit: Payer: Self-pay | Admitting: Hematology and Oncology

## 2014-12-20 ENCOUNTER — Ambulatory Visit (HOSPITAL_BASED_OUTPATIENT_CLINIC_OR_DEPARTMENT_OTHER): Payer: Medicare Other

## 2014-12-20 VITALS — BP 148/78 | HR 66 | Temp 98.9°F

## 2014-12-20 DIAGNOSIS — Z5112 Encounter for antineoplastic immunotherapy: Secondary | ICD-10-CM | POA: Diagnosis not present

## 2014-12-20 DIAGNOSIS — C9000 Multiple myeloma not having achieved remission: Secondary | ICD-10-CM | POA: Diagnosis not present

## 2014-12-20 MED ORDER — BORTEZOMIB CHEMO SQ INJECTION 3.5 MG (2.5MG/ML)
1.3000 mg/m2 | Freq: Once | INTRAMUSCULAR | Status: AC
  Administered 2014-12-20: 2 mg via SUBCUTANEOUS
  Filled 2014-12-20: qty 2

## 2014-12-20 MED ORDER — ONDANSETRON HCL 8 MG PO TABS
8.0000 mg | ORAL_TABLET | Freq: Once | ORAL | Status: AC
  Administered 2014-12-20: 8 mg via ORAL

## 2014-12-20 MED ORDER — ONDANSETRON HCL 8 MG PO TABS
ORAL_TABLET | ORAL | Status: AC
  Filled 2014-12-20: qty 1

## 2014-12-20 NOTE — Patient Instructions (Signed)
[facility] Discharge Instructions for Patients Receiving Chemotherapy  Today you received the following chemotherapy agents Velcade  To help prevent nausea and vomiting after your treatment, we encourage you to take your nausea medication    If you develop nausea and vomiting that is not controlled by your nausea medication, call the clinic.   BELOW ARE SYMPTOMS THAT SHOULD BE REPORTED IMMEDIATELY:  *FEVER GREATER THAN 100.5 F  *CHILLS WITH OR WITHOUT FEVER  NAUSEA AND VOMITING THAT IS NOT CONTROLLED WITH YOUR NAUSEA MEDICATION  *UNUSUAL SHORTNESS OF BREATH  *UNUSUAL BRUISING OR BLEEDING  TENDERNESS IN MOUTH AND THROAT WITH OR WITHOUT PRESENCE OF ULCERS  *URINARY PROBLEMS  *BOWEL PROBLEMS  UNUSUAL RASH Items with * indicate a potential emergency and should be followed up as soon as possible.  Feel free to call the clinic you have any questions or concerns. The clinic phone number is [phone].  Please show the CHEMO ALERT CARD at check-in to the Emergency Department and triage nurse.   

## 2014-12-23 ENCOUNTER — Telehealth: Payer: Self-pay

## 2014-12-23 NOTE — Telephone Encounter (Signed)
revlimid refill request to Dr Alvy Bimler

## 2014-12-24 ENCOUNTER — Ambulatory Visit (HOSPITAL_BASED_OUTPATIENT_CLINIC_OR_DEPARTMENT_OTHER): Payer: Medicare Other

## 2014-12-24 ENCOUNTER — Ambulatory Visit (HOSPITAL_BASED_OUTPATIENT_CLINIC_OR_DEPARTMENT_OTHER): Payer: Medicare Other | Admitting: Hematology and Oncology

## 2014-12-24 ENCOUNTER — Telehealth: Payer: Self-pay | Admitting: Hematology and Oncology

## 2014-12-24 ENCOUNTER — Other Ambulatory Visit: Payer: Self-pay | Admitting: *Deleted

## 2014-12-24 ENCOUNTER — Other Ambulatory Visit (HOSPITAL_BASED_OUTPATIENT_CLINIC_OR_DEPARTMENT_OTHER): Payer: Medicare Other

## 2014-12-24 ENCOUNTER — Encounter: Payer: Self-pay | Admitting: Hematology and Oncology

## 2014-12-24 VITALS — BP 156/77 | HR 72 | Temp 98.5°F | Resp 18 | Ht 61.0 in | Wt 131.8 lb

## 2014-12-24 DIAGNOSIS — Z5112 Encounter for antineoplastic immunotherapy: Secondary | ICD-10-CM | POA: Diagnosis not present

## 2014-12-24 DIAGNOSIS — D630 Anemia in neoplastic disease: Secondary | ICD-10-CM | POA: Diagnosis not present

## 2014-12-24 DIAGNOSIS — T451X5A Adverse effect of antineoplastic and immunosuppressive drugs, initial encounter: Secondary | ICD-10-CM

## 2014-12-24 DIAGNOSIS — C9000 Multiple myeloma not having achieved remission: Secondary | ICD-10-CM

## 2014-12-24 DIAGNOSIS — H00019 Hordeolum externum unspecified eye, unspecified eyelid: Secondary | ICD-10-CM | POA: Insufficient documentation

## 2014-12-24 DIAGNOSIS — R112 Nausea with vomiting, unspecified: Secondary | ICD-10-CM

## 2014-12-24 DIAGNOSIS — I808 Phlebitis and thrombophlebitis of other sites: Secondary | ICD-10-CM | POA: Insufficient documentation

## 2014-12-24 DIAGNOSIS — H00016 Hordeolum externum left eye, unspecified eyelid: Secondary | ICD-10-CM | POA: Diagnosis not present

## 2014-12-24 LAB — CBC WITH DIFFERENTIAL/PLATELET
BASO%: 0.9 % (ref 0.0–2.0)
Basophils Absolute: 0.1 10*3/uL (ref 0.0–0.1)
EOS%: 1.7 % (ref 0.0–7.0)
Eosinophils Absolute: 0.2 10*3/uL (ref 0.0–0.5)
HCT: 32.6 % — ABNORMAL LOW (ref 34.8–46.6)
HGB: 10.5 g/dL — ABNORMAL LOW (ref 11.6–15.9)
LYMPH%: 9.1 % — ABNORMAL LOW (ref 14.0–49.7)
MCH: 27.7 pg (ref 25.1–34.0)
MCHC: 32.4 g/dL (ref 31.5–36.0)
MCV: 85.6 fL (ref 79.5–101.0)
MONO#: 0.1 10*3/uL (ref 0.1–0.9)
MONO%: 1.4 % (ref 0.0–14.0)
NEUT#: 8.7 10*3/uL — ABNORMAL HIGH (ref 1.5–6.5)
NEUT%: 86.9 % — ABNORMAL HIGH (ref 38.4–76.8)
Platelets: 273 10*3/uL (ref 145–400)
RBC: 3.81 10*6/uL (ref 3.70–5.45)
RDW: 14.4 % (ref 11.2–14.5)
WBC: 10 10*3/uL (ref 3.9–10.3)
lymph#: 0.9 10*3/uL (ref 0.9–3.3)

## 2014-12-24 LAB — COMPREHENSIVE METABOLIC PANEL (CC13)
ALT: 12 U/L (ref 0–55)
AST: 15 U/L (ref 5–34)
Albumin: 3.2 g/dL — ABNORMAL LOW (ref 3.5–5.0)
Alkaline Phosphatase: 108 U/L (ref 40–150)
Anion Gap: 12 mEq/L — ABNORMAL HIGH (ref 3–11)
BUN: 8.1 mg/dL (ref 7.0–26.0)
CO2: 25 mEq/L (ref 22–29)
Calcium: 9.2 mg/dL (ref 8.4–10.4)
Chloride: 105 mEq/L (ref 98–109)
Creatinine: 1 mg/dL (ref 0.6–1.1)
EGFR: 60 mL/min/{1.73_m2} — ABNORMAL LOW (ref >90)
Glucose: 73 mg/dl (ref 70–140)
Potassium: 3.7 mEq/L (ref 3.5–5.1)
Sodium: 142 mEq/L (ref 136–145)
Total Bilirubin: 0.37 mg/dL (ref 0.20–1.20)
Total Protein: 6.3 g/dL — ABNORMAL LOW (ref 6.4–8.3)

## 2014-12-24 LAB — TECHNOLOGIST REVIEW

## 2014-12-24 MED ORDER — ONDANSETRON HCL 8 MG PO TABS
ORAL_TABLET | ORAL | Status: AC
  Filled 2014-12-24: qty 1

## 2014-12-24 MED ORDER — ONDANSETRON HCL 8 MG PO TABS
8.0000 mg | ORAL_TABLET | Freq: Once | ORAL | Status: AC
  Administered 2014-12-24: 8 mg via ORAL

## 2014-12-24 MED ORDER — BORTEZOMIB CHEMO SQ INJECTION 3.5 MG (2.5MG/ML)
1.3000 mg/m2 | Freq: Once | INTRAMUSCULAR | Status: AC
  Administered 2014-12-24: 2 mg via SUBCUTANEOUS
  Filled 2014-12-24: qty 2

## 2014-12-24 MED ORDER — LENALIDOMIDE 15 MG PO CAPS: ORAL_CAPSULE | ORAL | Status: DC

## 2014-12-24 NOTE — Assessment & Plan Note (Signed)
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 I recommend daily IV fluids, regular anti-emetics,  aggressive management of constipation and close follow-up visit. Since then, she has improved She will continue on proton pump inhibitor in the morning and Zantac at nighttime for possible component of gastritis Since we started this aggressive strategy last week, clinically she is improving.

## 2014-12-24 NOTE — Patient Instructions (Signed)
[facility] Discharge Instructions for Patients Receiving Chemotherapy  Today you received the following chemotherapy agents Velcade  To help prevent nausea and vomiting after your treatment, we encourage you to take your nausea medication    If you develop nausea and vomiting that is not controlled by your nausea medication, call the clinic.   BELOW ARE SYMPTOMS THAT SHOULD BE REPORTED IMMEDIATELY:  *FEVER GREATER THAN 100.5 F  *CHILLS WITH OR WITHOUT FEVER  NAUSEA AND VOMITING THAT IS NOT CONTROLLED WITH YOUR NAUSEA MEDICATION  *UNUSUAL SHORTNESS OF BREATH  *UNUSUAL BRUISING OR BLEEDING  TENDERNESS IN MOUTH AND THROAT WITH OR WITHOUT PRESENCE OF ULCERS  *URINARY PROBLEMS  *BOWEL PROBLEMS  UNUSUAL RASH Items with * indicate a potential emergency and should be followed up as soon as possible.  Feel free to call the clinic you have any questions or concerns. The clinic phone number is [phone].  Please show the CHEMO ALERT CARD at check-in to the Emergency Department and triage nurse.   

## 2014-12-24 NOTE — Progress Notes (Signed)
[facility] OFF[facility] PROGRESS NOTE  Patient Care Team: Josetta Huddle, MD as PCP - General (Internal Medicine)  SUMMARY OF ONCOLOGIC HISTORY: Oncology History    M-protein 0.69 gm/dl IFIX - IgG, Kappa IgG - 868 IgA - 19 IgM - < 20 Kappa - 21 Lambda - 5.7  09/06/2014 - Bone marrow aspirate and biopsy:   Normocellular marrow for age (40%) with a small monoclonal plasma cell population (1% on aspirate). Karyotype 36, XX  FISH Negative for myeloma associated changes  09/12/2014 - PET/CT  Two regions that are concerning for disease, one adjacent/involving the left ninth rib and one in the marrow of the right femur, in this patient with history of plasmacytoma.      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    INTERVAL HISTORY: Please see below for problem oriented charting. She returns for further follow-up. She has ultrasound venous Doppler done recently due to left arm swelling and was diagnosed with superficial thrombophlebitis. The dose of aspirin was increased. She also developed  a new left stye, improving. Her appetite is stable. She denies further nausea or vomiting. She is mildly constipated but she is holding off laxative therapy due to clinic appointments.  REVIEW OF SYSTEMS:   Constitutional: Denies fevers, chills or abnormal weight loss Eyes: Denies blurriness of vision Ears, nose, mouth, throat, and face: Denies mucositis or sore throat Respiratory: Denies cough, dyspnea or wheezes Cardiovascular: Denies palpitation, chest discomfort or lower extremity swelling Skin: Denies abnormal skin rashes Lymphatics: Denies new lymphadenopathy or easy bruising Neurological:Denies numbness, tingling or new weaknesses Behavioral/Psych: Mood is stable, no new changes  All other systems were reviewed with the patient and are negative.  I have reviewed the past medical history, past surgical history, social history and family history with the patient and they are unchanged from previous note.  ALLERGIES:  is allergic to codeine and ibuprofen.  MEDICATIONS:  Current Outpatient Prescriptions  Medication Sig Dispense Refill  . acyclovir (ZOVIRAX) 400 MG tablet Take 1 tablet (400 mg total) by mouth 2 (two) times daily. 60 tablet 3  . aspirin EC 81 MG tablet Take 81 mg by mouth daily with breakfast.    . Cholecalciferol (VITAMIN D) 2000 UNITS tablet Take 2,000 Units by mouth daily with breakfast.    . dexamethasone (DECADRON) 4 MG tablet Take 5 tablets (20 mg total) by mouth once a week. (Patient taking differently: Take 20 mg by mouth every Tuesday. ) 80 tablet 3  . Glycerin, Laxative, 1 G SUPP Place 1 suppository (1 g total)  rectally daily. (Patient taking differently: Place 1 suppository rectally daily as needed (for constipation). ) 12 suppository 3  . lenalidomide (REVLIMID) 15 MG capsule Take 1 cap daily for 14 days, rest 7 days 14 capsule 0  . omeprazole (PRILOSEC) 20 MG capsule Take 1 capsule (20 mg total) by mouth daily. 30 capsule 6  . ondansetron (ZOFRAN) 4 MG  tablet Take 1 tablet (4 mg total) by mouth every 6 (six) hours. 12 tablet 0  . ondansetron (ZOFRAN-ODT) 8 MG disintegrating tablet Take 8 mg by mouth every 8 (eight) hours as needed for nausea or vomiting.   0  . oxycodone (OXY-IR) 5 MG capsule Take 1 capsule (5 mg total) by mouth every 4 (four) hours as needed. (Patient taking differently: Take 5 mg by mouth every 4 (four) hours as needed for pain. ) 60 capsule 0  . prochlorperazine (COMPAZINE) 10 MG tablet Take 1 tablet (10 mg total) by mouth every 6 (six) hours as needed (Nausea or vomiting). 30 tablet 1  . ranitidine (ZANTAC) 75 MG tablet Take 1 tablet (75 mg total) by mouth at bedtime. 30 tablet 6  . senna (SENOKOT) 8.6 MG tablet Take 2 tablets (17.2 mg total) by mouth 3 (three) times daily. (Patient taking differently: Take 1 tablet by mouth at bedtime as needed for constipation. ) 180 tablet 3   No current facility-administered medications for this visit.    PHYSICAL EXAMINATION: ECOG PERFORMANCE STATUS: 1 - Symptomatic but completely ambulatory  Filed Vitals:   12/24/14 1153  BP: 156/77  Pulse: 72  Temp: 98.5 F (36.9 C)  Resp: 18   Filed Weights   12/24/14 1153  Weight: 131 lb 12.8 oz (59.784 kg)    GENERAL:alert, no distress and comfortable. She is cushingoid SKIN: skin color, texture, turgor are normal, no rashes or significant lesions. Noted some skin bruising EYES: normal, Conjunctiva are pink and non-injected, sclera clear. There is stye left eye OROPHARYNX:no exudate, no erythema and lips, buccal mucosa, and tongue normal  NECK: supple, thyroid normal size, non-tender, without nodularity LYMPH:  no palpable lymphadenopathy in the cervical, axillary or inguinal LUNGS: clear to auscultation and percussion with normal breathing effort HEART: regular rate & rhythm and no murmurs and no lower extremity edema ABDOMEN:abdomen soft, non-tender and normal bowel sounds Musculoskeletal:no cyanosis of digits and no clubbing .  There is no further swelling of the left upper extremity NEURO: alert & oriented x 3 with fluent speech, no focal motor/sensory deficits  LABORATORY DATA:  I have reviewed the data as listed    Component Value Date/Time   NA 142 12/24/2014 1133   NA 142 12/15/2014 1729   K 3.7 12/24/2014 1133   K 2.6* 12/15/2014 1729   CL 108 12/15/2014 1729   CO2 25 12/24/2014 1133   CO2 25 12/15/2014 1729   GLUCOSE 73 12/24/2014 1133   GLUCOSE 81 12/15/2014 1729   BUN 8.1 12/24/2014 1133   BUN 5* 12/15/2014 1729   CREATININE 1.0 12/24/2014 1133   CREATININE 0.75 12/15/2014 1729   CALCIUM 9.2 12/24/2014 1133   CALCIUM 8.1* 12/15/2014 1729   PROT 6.3* 12/24/2014 1133   ALBUMIN 3.2* 12/24/2014 1133   AST 15 12/24/2014 1133   ALT 12 12/24/2014 1133   ALKPHOS 108 12/24/2014 1133   BILITOT 0.37 12/24/2014 1133   GFRNONAA >60 12/15/2014 1729   GFRAA >60 12/15/2014 1729    No results found for: SPEP, UPEP  Lab Results  Component Value Date   WBC  10.0 12/24/2014   NEUTROABS 8.7* 12/24/2014   HGB 10.5* 12/24/2014   HCT 32.6* 12/24/2014   MCV 85.6 12/24/2014   PLT 273 12/24/2014      Chemistry      Component Value Date/Time   NA 142 12/24/2014 1133   NA 142 12/15/2014 1729   K 3.7 12/24/2014 1133   K 2.6* 12/15/2014 1729   CL 108 12/15/2014 1729   CO2 25 12/24/2014 1133   CO2 25 12/15/2014 1729   BUN 8.1 12/24/2014 1133   BUN 5* 12/15/2014 1729   CREATININE 1.0 12/24/2014 1133   CREATININE 0.75 12/15/2014 1729      Component Value Date/Time   CALCIUM 9.2 12/24/2014 1133   CALCIUM 8.1* 12/15/2014 1729   ALKPHOS 108 12/24/2014 1133   AST 15 12/24/2014 1133   ALT 12 12/24/2014 1133   BILITOT 0.37 12/24/2014 1133      ASSESSMENT & PLAN:  Multiple myeloma She tolerated treatment better. I suspected that this is because of significant deconditioning and chronic nausea and vomiting causing dehydration. Since we began IV fluids recently, she has improved. We will continue the  same this week. I will proceed to restart her treatment without dose adjustment.  Next cycle, I plan to repeat serum protein electrophoresis and free light chain to assess response to treatment. She has not made an appointment to see the dentist to obtain dental clearance. We will continue to hold Zometa. Since she is improving, I will start to initiate slow taper of dexamethasone. Starting on 12/31/2014, she will reduce dexamethasone to 4 tablets by mouth weekly. Setting of 01/07/2015, she reduce further to 3 tablets a week.   Chemotherapy induced nausea and vomiting The cause of her nausea & vomiting is multifactorial, likely due to steroid withdrawal, possible severe gastritis from chronic steroid exposure, and is exacerbated by recent constipation, dehydration, side effects of narcotics and possibly anticipatory nausea. I recommend daily IV fluids, regular anti-emetics,  aggressive management of constipation and close follow-up visit. Since then, she has improved She will continue on proton pump inhibitor in the morning and Zantac at nighttime for possible component of gastritis Since we started this aggressive strategy last week, clinically she is improving.   Superficial thrombophlebitis of upper extremity That was related to IV fluid administration. She was taking high-dose aspirin for a while. I recommend she reduce aspirin to 325 mg daily.   Sty, external She has a stye on the left eye. Continue conservative management and topical eyedrops.   Anemia in neoplastic disease This is likely due to recent treatment. The patient denies recent history of bleeding such as epistaxis, hematuria or hematochezia. She is asymptomatic from the anemia. I will observe for now.  She does not require transfusion now. I will continue the chemotherapy at current dose without dosage adjustment.  If the anemia gets progressive worse in the future, I might have to delay her treatment or adjust the  chemotherapy dose.       Orders Placed This Encounter  Procedures  . SPEP & IFE with QIG    Standing Status: Future     Number of Occurrences:      Standing Expiration Date: 01/28/2016  . Kappa/lambda light chains    Standing Status: Future     Number of Occurrences:      Standing Expiration Date: 01/28/2016  . Beta 2 microglobulin, serum    Standing Status: Future     Number of Occurrences:      Standing  Expiration Date: 01/28/2016   All questions were answered. The patient knows to call the clinic with any problems, questions or concerns. No barriers to learning was detected. I spent 30 minutes counseling the patient face to face. The total time spent in the appointment was 40 minutes and more than 50% was on counseling and review of test results     [facility], Crescent Gotham, MD 12/24/2014 12:21 PM

## 2014-12-24 NOTE — Assessment & Plan Note (Signed)

## 2014-12-24 NOTE — Telephone Encounter (Signed)
Gave and printed appt sched and avs for pt for May and June  °

## 2014-12-24 NOTE — Assessment & Plan Note (Signed)
That was related to IV fluid administration. She was taking high-dose aspirin for a while. I recommend she reduce aspirin to 325 mg daily.

## 2014-12-24 NOTE — Assessment & Plan Note (Signed)
She has a stye on the left eye. Continue conservative management and topical eyedrops.

## 2014-12-24 NOTE — Assessment & Plan Note (Signed)
She tolerated treatment better. I suspected that this is because of significant deconditioning and chronic nausea and vomiting causing dehydration. Since we began IV fluids recently, she has improved. We will continue the same this week. I will proceed to restart her treatment without dose adjustment.  Next cycle, I plan to repeat serum protein electrophoresis and free light chain to assess response to treatment. She has not made an appointment to see the dentist to obtain dental clearance. We will continue to hold Zometa. Since she is improving, I will start to initiate slow taper of dexamethasone. Starting on 12/31/2014, she will reduce dexamethasone to 4 tablets by mouth weekly. Setting of 01/07/2015, she reduce further to 3 tablets a week.

## 2014-12-27 ENCOUNTER — Ambulatory Visit (HOSPITAL_BASED_OUTPATIENT_CLINIC_OR_DEPARTMENT_OTHER): Payer: Medicare Other

## 2014-12-27 VITALS — BP 160/87 | HR 73 | Temp 98.2°F | Resp 24

## 2014-12-27 DIAGNOSIS — Z5112 Encounter for antineoplastic immunotherapy: Secondary | ICD-10-CM | POA: Diagnosis not present

## 2014-12-27 DIAGNOSIS — C9000 Multiple myeloma not having achieved remission: Secondary | ICD-10-CM

## 2014-12-27 MED ORDER — ONDANSETRON HCL 8 MG PO TABS
ORAL_TABLET | ORAL | Status: AC
  Filled 2014-12-27: qty 1

## 2014-12-27 MED ORDER — BORTEZOMIB CHEMO SQ INJECTION 3.5 MG (2.5MG/ML)
1.3000 mg/m2 | Freq: Once | INTRAMUSCULAR | Status: AC
  Administered 2014-12-27: 2 mg via SUBCUTANEOUS
  Filled 2014-12-27: qty 2

## 2014-12-27 MED ORDER — ONDANSETRON HCL 8 MG PO TABS
8.0000 mg | ORAL_TABLET | Freq: Once | ORAL | Status: AC
  Administered 2014-12-27: 8 mg via ORAL

## 2014-12-27 NOTE — Patient Instructions (Signed)
[facility] Discharge Instructions for Patients Receiving Chemotherapy  Today you received the following chemotherapy agents Velcade  To help prevent nausea and vomiting after your treatment, we encourage you to take your nausea medication    If you develop nausea and vomiting that is not controlled by your nausea medication, call the clinic.   BELOW ARE SYMPTOMS THAT SHOULD BE REPORTED IMMEDIATELY:  *FEVER GREATER THAN 100.5 F  *CHILLS WITH OR WITHOUT FEVER  NAUSEA AND VOMITING THAT IS NOT CONTROLLED WITH YOUR NAUSEA MEDICATION  *UNUSUAL SHORTNESS OF BREATH  *UNUSUAL BRUISING OR BLEEDING  TENDERNESS IN MOUTH AND THROAT WITH OR WITHOUT PRESENCE OF ULCERS  *URINARY PROBLEMS  *BOWEL PROBLEMS  UNUSUAL RASH Items with * indicate a potential emergency and should be followed up as soon as possible.  Feel free to call the clinic you have any questions or concerns. The clinic phone number is [phone].  Please show the CHEMO ALERT CARD at check-in to the Emergency Department and triage nurse.   

## 2014-12-30 ENCOUNTER — Telehealth: Payer: Self-pay | Admitting: *Deleted

## 2014-12-30 NOTE — Telephone Encounter (Signed)
Spoke with patient regarding her call to "on call team" this weekend. Pt states she had vomiting and diarrhea after taking velcade and revlimid. Vomiting was not controlled by zofran or compazine. On call group sent in order for phenergan. Pt states she has used phenergan X 5. Still slightly nauseated when she gets up, but has increased her intake. Took imodium X 1 for diarrhea with good results.

## 2015-01-07 ENCOUNTER — Other Ambulatory Visit (HOSPITAL_BASED_OUTPATIENT_CLINIC_OR_DEPARTMENT_OTHER): Payer: Medicare Other

## 2015-01-07 ENCOUNTER — Ambulatory Visit (HOSPITAL_BASED_OUTPATIENT_CLINIC_OR_DEPARTMENT_OTHER): Payer: Medicare Other

## 2015-01-07 VITALS — BP 143/70 | HR 77 | Temp 97.2°F | Resp 18

## 2015-01-07 DIAGNOSIS — C9000 Multiple myeloma not having achieved remission: Secondary | ICD-10-CM | POA: Diagnosis not present

## 2015-01-07 DIAGNOSIS — Z5112 Encounter for antineoplastic immunotherapy: Secondary | ICD-10-CM | POA: Diagnosis not present

## 2015-01-07 LAB — COMPREHENSIVE METABOLIC PANEL (CC13)
ALT: 13 U/L (ref 0–55)
AST: 19 U/L (ref 5–34)
Albumin: 3.4 g/dL — ABNORMAL LOW (ref 3.5–5.0)
Alkaline Phosphatase: 95 U/L (ref 40–150)
Anion Gap: 10 mEq/L (ref 3–11)
BUN: 7.3 mg/dL (ref 7.0–26.0)
CO2: 24 mEq/L (ref 22–29)
Calcium: 9.2 mg/dL (ref 8.4–10.4)
Chloride: 107 mEq/L (ref 98–109)
Creatinine: 0.9 mg/dL (ref 0.6–1.1)
EGFR: 65 mL/min/{1.73_m2} — ABNORMAL LOW (ref >90)
Glucose: 90 mg/dl (ref 70–140)
Potassium: 4.2 mEq/L (ref 3.5–5.1)
Sodium: 141 mEq/L (ref 136–145)
Total Bilirubin: 0.59 mg/dL (ref 0.20–1.20)
Total Protein: 6.5 g/dL (ref 6.4–8.3)

## 2015-01-07 LAB — CBC WITH DIFFERENTIAL/PLATELET
BASO%: 1.2 % (ref 0.0–2.0)
Basophils Absolute: 0.1 10*3/uL (ref 0.0–0.1)
EOS%: 4.1 % (ref 0.0–7.0)
Eosinophils Absolute: 0.3 10*3/uL (ref 0.0–0.5)
HCT: 32.6 % — ABNORMAL LOW (ref 34.8–46.6)
HGB: 10.9 g/dL — ABNORMAL LOW (ref 11.6–15.9)
LYMPH%: 10 % — ABNORMAL LOW (ref 14.0–49.7)
MCH: 28.6 pg (ref 25.1–34.0)
MCHC: 33.5 g/dL (ref 31.5–36.0)
MCV: 85.3 fL (ref 79.5–101.0)
MONO#: 0.3 10*3/uL (ref 0.1–0.9)
MONO%: 5.1 % (ref 0.0–14.0)
NEUT#: 4.8 10*3/uL (ref 1.5–6.5)
NEUT%: 79.6 % — ABNORMAL HIGH (ref 38.4–76.8)
Platelets: 430 10*3/uL — ABNORMAL HIGH (ref 145–400)
RBC: 3.82 10*6/uL (ref 3.70–5.45)
RDW: 15.3 % — ABNORMAL HIGH (ref 11.2–14.5)
WBC: 6.1 10*3/uL (ref 3.9–10.3)
lymph#: 0.6 10*3/uL — ABNORMAL LOW (ref 0.9–3.3)

## 2015-01-07 MED ORDER — BORTEZOMIB CHEMO SQ INJECTION 3.5 MG (2.5MG/ML)
1.3000 mg/m2 | Freq: Once | INTRAMUSCULAR | Status: AC
  Administered 2015-01-07: 2 mg via SUBCUTANEOUS
  Filled 2015-01-07: qty 2

## 2015-01-07 MED ORDER — ONDANSETRON HCL 8 MG PO TABS
ORAL_TABLET | ORAL | Status: AC
  Filled 2015-01-07: qty 1

## 2015-01-07 MED ORDER — ONDANSETRON HCL 8 MG PO TABS
8.0000 mg | ORAL_TABLET | Freq: Once | ORAL | Status: AC
  Administered 2015-01-07: 8 mg via ORAL

## 2015-01-07 NOTE — Patient Instructions (Signed)
[facility] Discharge Instructions for Patients Receiving Chemotherapy  Today you received the following chemotherapy agents Velcade. To help prevent nausea and vomiting after your treatment, we encourage you to take your nausea medication as directed.  If you develop nausea and vomiting that is not controlled by your nausea medication, call the clinic.   BELOW ARE SYMPTOMS THAT SHOULD BE REPORTED IMMEDIATELY:  *FEVER GREATER THAN 100.5 F  *CHILLS WITH OR WITHOUT FEVER  NAUSEA AND VOMITING THAT IS NOT CONTROLLED WITH YOUR NAUSEA MEDICATION  *UNUSUAL SHORTNESS OF BREATH  *UNUSUAL BRUISING OR BLEEDING  TENDERNESS IN MOUTH AND THROAT WITH OR WITHOUT PRESENCE OF ULCERS  *URINARY PROBLEMS  *BOWEL PROBLEMS  UNUSUAL RASH Items with * indicate a potential emergency and should be followed up as soon as possible.  Feel free to call the clinic you have any questions or concerns. The clinic phone number is [phone].  Please show the CHEMO ALERT CARD at check-in to the Emergency Department and triage nurse.    

## 2015-01-09 LAB — SPEP & IFE WITH QIG
Abnormal Protein Band1: 0.2 g/dL
Albumin ELP: 3.5 g/dL — ABNORMAL LOW (ref 3.8–4.8)
Alpha-1-Globulin: 0.4 g/dL — ABNORMAL HIGH (ref 0.2–0.3)
Alpha-2-Globulin: 0.9 g/dL (ref 0.5–0.9)
Beta 2: 0.3 g/dL (ref 0.2–0.5)
Beta Globulin: 0.4 g/dL (ref 0.4–0.6)
Gamma Globulin: 0.5 g/dL — ABNORMAL LOW (ref 0.8–1.7)
IgA: 32 mg/dL — ABNORMAL LOW (ref 69–380)
IgG (Immunoglobin G), Serum: 528 mg/dL — ABNORMAL LOW (ref 690–1700)
IgM, Serum: 18 mg/dL — ABNORMAL LOW (ref 52–322)
Total Protein, Serum Electrophoresis: 6 g/dL — ABNORMAL LOW (ref 6.1–8.1)

## 2015-01-09 LAB — KAPPA/LAMBDA LIGHT CHAINS
Kappa free light chain: 0.95 mg/dL (ref 0.33–1.94)
Kappa:Lambda Ratio: 0.63 (ref 0.26–1.65)
Lambda Free Lght Chn: 1.5 mg/dL (ref 0.57–2.63)

## 2015-01-09 LAB — BETA 2 MICROGLOBULIN, SERUM: Beta-2 Microglobulin: 3.57 mg/L — ABNORMAL HIGH (ref <=2.51)

## 2015-01-10 ENCOUNTER — Ambulatory Visit (HOSPITAL_BASED_OUTPATIENT_CLINIC_OR_DEPARTMENT_OTHER): Payer: Medicare Other

## 2015-01-10 VITALS — BP 133/97 | HR 75 | Temp 98.5°F | Resp 18

## 2015-01-10 DIAGNOSIS — Z5112 Encounter for antineoplastic immunotherapy: Secondary | ICD-10-CM | POA: Diagnosis not present

## 2015-01-10 DIAGNOSIS — C9000 Multiple myeloma not having achieved remission: Secondary | ICD-10-CM

## 2015-01-10 MED ORDER — ONDANSETRON HCL 8 MG PO TABS
ORAL_TABLET | ORAL | Status: AC
  Filled 2015-01-10: qty 1

## 2015-01-10 MED ORDER — ONDANSETRON HCL 8 MG PO TABS
8.0000 mg | ORAL_TABLET | Freq: Once | ORAL | Status: AC
  Administered 2015-01-10: 8 mg via ORAL

## 2015-01-10 MED ORDER — BORTEZOMIB CHEMO SQ INJECTION 3.5 MG (2.5MG/ML)
1.3000 mg/m2 | Freq: Once | INTRAMUSCULAR | Status: AC
  Administered 2015-01-10: 2 mg via SUBCUTANEOUS
  Filled 2015-01-10: qty 2

## 2015-01-10 NOTE — Progress Notes (Signed)
Pt reports approx 3 weeks ago Dr America Brown diagnosed her with blocked left lower lid tearduct and started tobramycin drops . She said next day she had bump "pop up"  on her right upper eye lid. She started using tobramycin drops in Right eye too.  She does have a small red bump  on her right upper eye lid - no drainage she states pain is 4/10.Per Dr Alvy Bimler okay to treat pt today with Velcade I also instructed her to see Dr Inda Merlin Monday for f/u and see new bump.

## 2015-01-10 NOTE — Patient Instructions (Signed)
[facility] Discharge Instructions for Patients Receiving Chemotherapy  Today you received the following chemotherapy agents VELCADE  To help prevent nausea and vomiting after your treatment, we encourage you to take your nausea medication as prescribed.   If you develop nausea and vomiting that is not controlled by your nausea medication, call the clinic.   BELOW ARE SYMPTOMS THAT SHOULD BE REPORTED IMMEDIATELY:  *FEVER GREATER THAN 100.5 F  *CHILLS WITH OR WITHOUT FEVER  NAUSEA AND VOMITING THAT IS NOT CONTROLLED WITH YOUR NAUSEA MEDICATION  *UNUSUAL SHORTNESS OF BREATH  *UNUSUAL BRUISING OR BLEEDING  TENDERNESS IN MOUTH AND THROAT WITH OR WITHOUT PRESENCE OF ULCERS  *URINARY PROBLEMS  *BOWEL PROBLEMS  UNUSUAL RASH Items with * indicate a potential emergency and should be followed up as soon as possible.  Feel free to call the clinic you have any questions or concerns. The clinic phone number is [phone].  Please show the CHEMO ALERT CARD at check-in to the Emergency Department and triage nurse.   

## 2015-01-14 ENCOUNTER — Ambulatory Visit (HOSPITAL_BASED_OUTPATIENT_CLINIC_OR_DEPARTMENT_OTHER): Payer: Medicare Other | Admitting: Hematology and Oncology

## 2015-01-14 ENCOUNTER — Other Ambulatory Visit (HOSPITAL_BASED_OUTPATIENT_CLINIC_OR_DEPARTMENT_OTHER): Payer: Medicare Other

## 2015-01-14 ENCOUNTER — Ambulatory Visit: Payer: Medicare Other

## 2015-01-14 ENCOUNTER — Telehealth: Payer: Self-pay | Admitting: Hematology and Oncology

## 2015-01-14 VITALS — BP 137/69 | HR 77 | Temp 98.2°F | Resp 18 | Ht 61.0 in | Wt 130.7 lb

## 2015-01-14 DIAGNOSIS — D630 Anemia in neoplastic disease: Secondary | ICD-10-CM | POA: Diagnosis not present

## 2015-01-14 DIAGNOSIS — C9000 Multiple myeloma not having achieved remission: Secondary | ICD-10-CM

## 2015-01-14 DIAGNOSIS — R112 Nausea with vomiting, unspecified: Secondary | ICD-10-CM | POA: Diagnosis not present

## 2015-01-14 DIAGNOSIS — E46 Unspecified protein-calorie malnutrition: Secondary | ICD-10-CM

## 2015-01-14 DIAGNOSIS — H019 Unspecified inflammation of eyelid: Secondary | ICD-10-CM | POA: Diagnosis not present

## 2015-01-14 DIAGNOSIS — T451X5A Adverse effect of antineoplastic and immunosuppressive drugs, initial encounter: Secondary | ICD-10-CM

## 2015-01-14 LAB — COMPREHENSIVE METABOLIC PANEL (CC13)
ALT: 11 U/L (ref 0–55)
AST: 15 U/L (ref 5–34)
Albumin: 3.2 g/dL — ABNORMAL LOW (ref 3.5–5.0)
Alkaline Phosphatase: 79 U/L (ref 40–150)
Anion Gap: 8 mEq/L (ref 3–11)
BUN: 5.4 mg/dL — ABNORMAL LOW (ref 7.0–26.0)
CO2: 24 mEq/L (ref 22–29)
Calcium: 8.7 mg/dL (ref 8.4–10.4)
Chloride: 109 mEq/L (ref 98–109)
Creatinine: 1 mg/dL (ref 0.6–1.1)
EGFR: 58 mL/min/{1.73_m2} — ABNORMAL LOW (ref >90)
Glucose: 86 mg/dl (ref 70–140)
Potassium: 4 mEq/L (ref 3.5–5.1)
Sodium: 142 mEq/L (ref 136–145)
Total Bilirubin: 0.54 mg/dL (ref 0.20–1.20)
Total Protein: 5.8 g/dL — ABNORMAL LOW (ref 6.4–8.3)

## 2015-01-14 LAB — TECHNOLOGIST REVIEW

## 2015-01-14 LAB — CBC WITH DIFFERENTIAL/PLATELET
BASO%: 0.6 % (ref 0.0–2.0)
Basophils Absolute: 0 10*3/uL (ref 0.0–0.1)
EOS%: 4.6 % (ref 0.0–7.0)
Eosinophils Absolute: 0.3 10*3/uL (ref 0.0–0.5)
HCT: 31.4 % — ABNORMAL LOW (ref 34.8–46.6)
HGB: 10.1 g/dL — ABNORMAL LOW (ref 11.6–15.9)
LYMPH%: 9.7 % — ABNORMAL LOW (ref 14.0–49.7)
MCH: 28.5 pg (ref 25.1–34.0)
MCHC: 32.2 g/dL (ref 31.5–36.0)
MCV: 88.5 fL (ref 79.5–101.0)
MONO#: 0.2 10*3/uL (ref 0.1–0.9)
MONO%: 2.8 % (ref 0.0–14.0)
NEUT#: 5.4 10*3/uL (ref 1.5–6.5)
NEUT%: 82.3 % — ABNORMAL HIGH (ref 38.4–76.8)
Platelets: 245 10*3/uL (ref 145–400)
RBC: 3.55 10*6/uL — ABNORMAL LOW (ref 3.70–5.45)
RDW: 15.8 % — ABNORMAL HIGH (ref 11.2–14.5)
WBC: 6.5 10*3/uL (ref 3.9–10.3)
lymph#: 0.6 10*3/uL — ABNORMAL LOW (ref 0.9–3.3)

## 2015-01-14 NOTE — Telephone Encounter (Signed)
Gave adn printed appt sched adn avs for pt for [facility]

## 2015-01-16 ENCOUNTER — Encounter: Payer: Self-pay | Admitting: Hematology and Oncology

## 2015-01-16 DIAGNOSIS — E46 Unspecified protein-calorie malnutrition: Secondary | ICD-10-CM | POA: Insufficient documentation

## 2015-01-16 DIAGNOSIS — H019 Unspecified inflammation of eyelid: Secondary | ICD-10-CM | POA: Insufficient documentation

## 2015-01-16 NOTE — Assessment & Plan Note (Signed)
She has recurrent infection of the eyelids I will hold treatment for now and continue steroidal taper I recommend she consult with ophthalmologist for urgent evaluation

## 2015-01-16 NOTE — Progress Notes (Signed)
[place] OFFICE PROGRESS NOTE  Patient Care Team: Josetta Huddle, MD as PCP - General (Internal Medicine)  SUMMARY OF ONCOLOGIC HISTORY: Oncology History    M-protein 0.69 gm/dl IFIX - IgG, Kappa IgG - 868 IgA - 19 IgM - < 20 Kappa - 21 Lambda - 5.7  09/06/2014 - Bone marrow aspirate and biopsy:   Normocellular marrow for age (40%) with a small monoclonal plasma cell population (1% on aspirate). Karyotype 35, XX  FISH Negative for myeloma associated changes  09/12/2014 - PET/CT  Two regions that are concerning for disease, one adjacent/involving the left ninth rib and one in the marrow of the right femur, in this patient with history of plasmacytoma.      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    INTERVAL HISTORY: Please see below for problem oriented charting. She is seen today prior to treatment She have recurrent eyelid infection on both eyes She denies any changes in her vision she denies recent nausea or vomiting No change in bowel habits Denies recent bone  pain Denies peripheral neuropathy or skin rash from Velcade  REVI EW OF SYSTEMS:   Constitutional: Denies fevers, chills or abnormal weight loss Eyes: Denies blurriness of vision Ears, nose, mouth, throat, and face: Denies mucositis or sore throat Respiratory: Denies cough, dyspnea or wheezes Cardiovascular: Denies palpitation, chest discomfort or lower extremity swelling Gastrointestinal:  Denies nausea, heartburn or change in bowel habits Skin: Denies abnormal skin rashes Lymphatics: Denies new lymphadenopathy or easy bruising Neurological:Denies numbness, tingling or new weaknesses Behavioral/Psych: Mood is stable, no new changes  All other systems were reviewed with the patient and are negative.  I have reviewed the past medical history, past surgical history, social history and family history with the patient and they are unchanged from previous note.  ALLERGIES:  is allergic to codeine and ibuprofen.  MEDICATIONS:  Current Outpatient Prescriptions  Medication Sig Dispense Refill  . acyclovir (ZOVIRAX) 400 MG tablet Take 1 tablet (400 mg total) by mouth 2 (two) times daily. 60 tablet 3  . amoxicillin (AMOXIL) 500 MG capsule   0  . aspirin EC 81 MG tablet Take 81 mg by mouth daily with breakfast.    . Cholecalciferol (VITAMIN D) 2000 UNITS tablet Take 2,000 Units by mouth daily with breakfast.    . dexamethasone (DECADRON) 4 MG tablet Take 5 tablets (20 mg total) by mouth once a week. (Patient taking differently: Take 20 mg by mouth every Tuesday. ) 80 tablet 3  . lenalidomide (REVLIMID) 15 MG capsule Take 1  cap daily for 14 days, rest 7 days 14 capsule 0  . omeprazole (PRILOSEC) 20 MG capsule Take 1 capsule (20 mg total) by mouth daily. 30 capsule 6  . ondansetron (ZOFRAN) 4 MG tablet Take 1 tablet (4 mg total) by mouth every 6 (six) hours. 12 tablet 0  . ondansetron (ZOFRAN-ODT) 8 MG disintegrating tablet Take 8 mg by mouth every 8 (eight) hours as needed for nausea or vomiting.    0  . senna (SENOKOT) 8.6 MG tablet Take 2 tablets (17.2 mg total) by mouth 3 (three) times daily. (Patient taking differently: Take 1 tablet by mouth at bedtime as needed for constipation. ) 180 tablet 3  . tobramycin (TOBREX) 0.3 % ophthalmic solution   0  . Glycerin, Laxative, 1 G SUPP Place 1 suppository (1 g total) rectally daily. (Patient not taking: Reported on 01/14/2015) 12 suppository 3  . oxycodone (OXY-IR) 5 MG capsule Take 1 capsule (5 mg total) by mouth every 4 (four) hours as needed. (Patient not taking: Reported on 01/14/2015) 60 capsule 0  . prochlorperazine (COMPAZINE) 10 MG tablet Take 1 tablet (10 mg total) by mouth every 6 (six) hours as needed (Nausea or vomiting). (Patient not taking: Reported on 01/14/2015) 30 tablet 1  . ranitidine (ZANTAC) 75 MG tablet Take 1 tablet (75 mg total) by mouth at bedtime. (Patient not taking: Reported on 01/14/2015) 30 tablet 6   No current facility-administered medications for this visit.    PHYSICAL EXAMINATION: ECOG PERFORMANCE STATUS: 1 - Symptomatic but completely ambulatory  Filed Vitals:   01/14/15 1142  BP: 137/69  Pulse: 77  Temp: 98.2 F (36.8 C)  Resp: 18   Filed Weights   01/14/15 1142  Weight: 130 lb 11.2 oz (59.285 kg)    GENERAL:alert, no distress and comfortable SKIN: skin color, texture, turgor are normal, no rashes or significant lesions EYES: Noted bilateral eyelid infection Conjunctiva appears normal OROPHARYNX:no exudate, no erythema and lips, buccal mucosa, and tongue normal  NECK: supple, thyroid normal size, non-tender, without nodularity LYMPH:  no palpable lymphadenopathy in the cervical, axillary or inguinal LUNGS: clear to auscultation and percussion with normal breathing effort HEART: regular rate & rhythm and no murmurs and no lower extremity edema ABDOMEN:abdomen soft, non-tender and normal bowel sounds Musculoskeletal:no cyanosis of digits and no clubbing  NEURO: alert & oriented x 3 with fluent  speech, no focal motor/sensory deficits  LABORATORY DATA:  I have reviewed the data as listed    Component Value Date/Time   NA 142 01/14/2015 1126   NA 142 12/15/2014 1729   K 4.0 01/14/2015 1126   K 2.6* 12/15/2014 1729   CL 108 12/15/2014 1729   CO2 24 01/14/2015 1126   CO2 25 12/15/2014 1729   GLUCOSE 86 01/14/2015 1126   GLUCOSE 81 12/15/2014 1729   BUN 5.4* 01/14/2015 1126   BUN 5* 12/15/2014 1729   CREATININE 1.0 01/14/2015 1126   CREATININE 0.75 12/15/2014 1729   CALCIUM 8.7 01/14/2015 1126   CALCIUM 8.1* 12/15/2014 1729   PROT 5.8* 01/14/2015 1126   ALBUMIN 3.2* 01/14/2015 1126   AST 15 01/14/2015 1126   ALT 11 01/14/2015 1126   ALKPHOS 79 01/14/2015 1126   BILITOT 0.54 01/14/2015 1126   GFRNONAA >60 12/15/2014 1729   GFRAA >60 12/15/2014 1729    No results found for: SPEP, UPEP  Lab Results  Component Value Date   WBC 6.5 01/14/2015   NEUTROABS 5.4 01/14/2015   HGB 10.1* 01/14/2015  HCT 31.4* 01/14/2015   MCV 88.5 01/14/2015   PLT 245 01/14/2015      Chemistry      Component Value Date/Time   NA 142 01/14/2015 1126   NA 142 12/15/2014 1729   K 4.0 01/14/2015 1126   K 2.6* 12/15/2014 1729   CL 108 12/15/2014 1729   CO2 24 01/14/2015 1126   CO2 25 12/15/2014 1729   BUN 5.4* 01/14/2015 1126   BUN 5* 12/15/2014 1729   CREATININE 1.0 01/14/2015 1126   CREATININE 0.75 12/15/2014 1729      Component Value Date/Time   CALCIUM 8.7 01/14/2015 1126   CALCIUM 8.1* 12/15/2014 1729   ALKPHOS 79 01/14/2015 1126   AST 15 01/14/2015 1126   ALT 11 01/14/2015 1126   BILITOT 0.54 01/14/2015 1126     ASSESSMENT & PLAN:  Multiple myeloma She has excellent response to treatment albeit with multiple different side effects Due to recurrent eyelid infection, I recommend she hold off the Revlimid but to continue Velcade for now I will also continue  on dexamethasone taper After she completes 4 cycles of therapy, if she has achieved greater than VGPR I will call  [facility] to consider autologous stem cell transplant   Anemia in neoplastic disease This is likely due to recent treatment. The patient denies recent history of bleeding such as epistaxis, hematuria or hematochezia. She is asymptomatic from the anemia. I will observe for now.   Due to recurrent infection, I will hold off treatment for now  Chemotherapy induced nausea and vomiting This has improved She will continue anti-medics as needed  Infection of eyelid She has recurrent infection of the eyelids I will hold treatment for now and continue steroidal taper I recommend she consult with ophthalmologist for urgent evaluation  Protein calorie malnutrition She has significant protein calorie malnutrition but that has stabilized since her nausea has resolved I continue to encourage her to increase oral food intake as tolerated   Orders Placed This Encounter  Procedures  . SPEP & IFE with [facility]    Standing Status: Future     Number of Occurrences:      Standing Expiration Date: 02/18/2016   All questions were answered. The patient knows to call the clinic with any problems, questions or concerns. No barriers to learning was detected. I spent 25 minutes counseling the patient face to face. The total time spent in the appointment was 40 minutes and more than 50% was on counseling and review of test results     [facility], [place], MD 01/16/2015 12:48 PM

## 2015-01-16 NOTE — Assessment & Plan Note (Signed)
This has improved She will continue anti-medics as needed

## 2015-01-16 NOTE — Assessment & Plan Note (Signed)
This is likely due to recent treatment. The patient denies recent history of bleeding such as epistaxis, hematuria or hematochezia. She is asymptomatic from the anemia. I will observe for now.   Due to recurrent infection, I will hold off treatment for now

## 2015-01-16 NOTE — Assessment & Plan Note (Signed)
She has excellent response to treatment albeit with multiple different side effects Due to recurrent eyelid infection, I recommend she hold off the Revlimid but to continue Velcade for now I will also continue  on dexamethasone taper After she completes 4 cycles of therapy, if she has achieved greater than VGPR I will call [facility] to consider autologous stem cell transplant

## 2015-01-16 NOTE — Assessment & Plan Note (Signed)
She has significant protein calorie malnutrition but that has stabilized since her nausea has resolved I continue to encourage her to increase oral food intake as tolerated

## 2015-01-17 ENCOUNTER — Ambulatory Visit: Payer: Medicare Other

## 2015-01-20 ENCOUNTER — Telehealth: Payer: Self-pay | Admitting: *Deleted

## 2015-01-20 NOTE — Telephone Encounter (Signed)
[facility] faxed revlimid refill request.  Request to provider's desk/in-basket for review.

## 2015-01-21 ENCOUNTER — Other Ambulatory Visit: Payer: Self-pay | Admitting: *Deleted

## 2015-01-21 MED ORDER — LENALIDOMIDE 15 MG PO CAPS: ORAL_CAPSULE | ORAL | Status: DC

## 2015-01-22 NOTE — Telephone Encounter (Signed)
[facility] faxed confirmation of facsimile receipt for Revlimid prescription referral.  [facility] will verify insurance and make delivery arrangements with patient. 

## 2015-01-23 ENCOUNTER — Telehealth: Payer: Self-pay | Admitting: *Deleted

## 2015-01-23 NOTE — Telephone Encounter (Signed)
Pt reports the eye infection she had last week got worse and she saw Dr. Bing Plume on 6/9 and he had to drain a "pus pocket" on her right eye.  She is on antibiotics and steroid eye drops.  It is getting better but not completely better yet.  Treatment was put on hold and she is scheduled to resume treatment on 6/21 w/ revlimid and velcade.    Dr. Alvy Bimler made aware and instructs for pt to hold treatment for one more week.  We will cancel appts next week and pt to return on 6/28 for lab/velcade.  She is to start next cycle revlimid on 6/28.  Please call us if her eye is not better by then.   Pt verbalized understanding.  She has f/u w/ Dr. Bing Plume on 6/30.

## 2015-01-27 ENCOUNTER — Telehealth: Payer: Self-pay

## 2015-01-27 NOTE — Telephone Encounter (Signed)
Confirmation of rx shipment revlimid. Ship date 01/24/15

## 2015-01-28 ENCOUNTER — Other Ambulatory Visit: Payer: Medicare Other

## 2015-01-28 ENCOUNTER — Ambulatory Visit: Payer: Medicare Other

## 2015-01-31 ENCOUNTER — Ambulatory Visit: Payer: Medicare Other

## 2015-02-04 ENCOUNTER — Other Ambulatory Visit: Payer: Self-pay | Admitting: Hematology and Oncology

## 2015-02-04 ENCOUNTER — Ambulatory Visit (HOSPITAL_BASED_OUTPATIENT_CLINIC_OR_DEPARTMENT_OTHER): Payer: Medicare Other

## 2015-02-04 ENCOUNTER — Other Ambulatory Visit (HOSPITAL_BASED_OUTPATIENT_CLINIC_OR_DEPARTMENT_OTHER): Payer: Medicare Other

## 2015-02-04 VITALS — BP 159/93 | HR 84 | Temp 98.9°F | Resp 24

## 2015-02-04 DIAGNOSIS — Z5112 Encounter for antineoplastic immunotherapy: Secondary | ICD-10-CM | POA: Diagnosis not present

## 2015-02-04 DIAGNOSIS — C9000 Multiple myeloma not having achieved remission: Secondary | ICD-10-CM | POA: Diagnosis not present

## 2015-02-04 LAB — CBC WITH DIFFERENTIAL/PLATELET
BASO%: 0.5 % (ref 0.0–2.0)
Basophils Absolute: 0 10*3/uL (ref 0.0–0.1)
EOS%: 1 % (ref 0.0–7.0)
Eosinophils Absolute: 0.1 10*3/uL (ref 0.0–0.5)
HCT: 36.8 % (ref 34.8–46.6)
HGB: 11.9 g/dL (ref 11.6–15.9)
LYMPH%: 13.4 % — ABNORMAL LOW (ref 14.0–49.7)
MCH: 28.1 pg (ref 25.1–34.0)
MCHC: 32.3 g/dL (ref 31.5–36.0)
MCV: 87 fL (ref 79.5–101.0)
MONO#: 0.1 10*3/uL (ref 0.1–0.9)
MONO%: 1.9 % (ref 0.0–14.0)
NEUT#: 4.8 10*3/uL (ref 1.5–6.5)
NEUT%: 83.2 % — ABNORMAL HIGH (ref 38.4–76.8)
Platelets: 327 10*3/uL (ref 145–400)
RBC: 4.23 10*6/uL (ref 3.70–5.45)
RDW: 15.1 % — ABNORMAL HIGH (ref 11.2–14.5)
WBC: 5.8 10*3/uL (ref 3.9–10.3)
lymph#: 0.8 10*3/uL — ABNORMAL LOW (ref 0.9–3.3)

## 2015-02-04 LAB — COMPREHENSIVE METABOLIC PANEL (CC13)
ALT: 9 U/L (ref 0–55)
AST: 16 U/L (ref 5–34)
Albumin: 3.6 g/dL (ref 3.5–5.0)
Alkaline Phosphatase: 88 U/L (ref 40–150)
Anion Gap: 7 mEq/L (ref 3–11)
BUN: 10.5 mg/dL (ref 7.0–26.0)
CO2: 26 mEq/L (ref 22–29)
Calcium: 9.5 mg/dL (ref 8.4–10.4)
Chloride: 108 mEq/L (ref 98–109)
Creatinine: 1.1 mg/dL (ref 0.6–1.1)
EGFR: 52 mL/min/{1.73_m2} — ABNORMAL LOW (ref >90)
Glucose: 100 mg/dl (ref 70–140)
Potassium: 3.9 mEq/L (ref 3.5–5.1)
Sodium: 141 mEq/L (ref 136–145)
Total Bilirubin: 0.43 mg/dL (ref 0.20–1.20)
Total Protein: 6.3 g/dL — ABNORMAL LOW (ref 6.4–8.3)

## 2015-02-04 MED ORDER — BORTEZOMIB CHEMO SQ INJECTION 3.5 MG (2.5MG/ML)
1.3000 mg/m2 | Freq: Once | INTRAMUSCULAR | Status: AC
  Administered 2015-02-04: 2 mg via SUBCUTANEOUS
  Filled 2015-02-04: qty 2

## 2015-02-04 MED ORDER — ONDANSETRON HCL 8 MG PO TABS
ORAL_TABLET | ORAL | Status: AC
Start: 2015-02-04 — End: 2015-02-04
  Filled 2015-02-04: qty 1

## 2015-02-04 MED ORDER — ONDANSETRON HCL 8 MG PO TABS
8.0000 mg | ORAL_TABLET | Freq: Once | ORAL | Status: AC
  Administered 2015-02-04: 8 mg via ORAL

## 2015-02-04 NOTE — Patient Instructions (Signed)
[facility] Discharge Instructions for Patients Receiving Chemotherapy  Today you received the following chemotherapy agents VELCADE  To help prevent nausea and vomiting after your treatment, we encourage you to take your nausea medication as prescribed.   If you develop nausea and vomiting that is not controlled by your nausea medication, call the clinic.   BELOW ARE SYMPTOMS THAT SHOULD BE REPORTED IMMEDIATELY:  *FEVER GREATER THAN 100.5 F  *CHILLS WITH OR WITHOUT FEVER  NAUSEA AND VOMITING THAT IS NOT CONTROLLED WITH YOUR NAUSEA MEDICATION  *UNUSUAL SHORTNESS OF BREATH  *UNUSUAL BRUISING OR BLEEDING  TENDERNESS IN MOUTH AND THROAT WITH OR WITHOUT PRESENCE OF ULCERS  *URINARY PROBLEMS  *BOWEL PROBLEMS  UNUSUAL RASH Items with * indicate a potential emergency and should be followed up as soon as possible.  Feel free to call the clinic you have any questions or concerns. The clinic phone number is [phone].  Please show the CHEMO ALERT CARD at check-in to the Emergency Department and triage nurse.   

## 2015-02-04 NOTE — Progress Notes (Signed)
Patient and husband say she took decadron at home today.  Eyes are much improved.  Patient sees Dr. Bing Plume (eye) this Thursday and then Dr. Alvy Bimler on Friday.

## 2015-02-06 LAB — SPEP & IFE WITH QIG
Abnormal Protein Band1: 0.2 g/dL
Albumin ELP: 3.7 g/dL — ABNORMAL LOW (ref 3.8–4.8)
Alpha-1-Globulin: 0.4 g/dL — ABNORMAL HIGH (ref 0.2–0.3)
Alpha-2-Globulin: 0.8 g/dL (ref 0.5–0.9)
Beta 2: 0.3 g/dL (ref 0.2–0.5)
Beta Globulin: 0.4 g/dL (ref 0.4–0.6)
Gamma Globulin: 0.5 g/dL — ABNORMAL LOW (ref 0.8–1.7)
IgA: 49 mg/dL — ABNORMAL LOW (ref 69–380)
IgG (Immunoglobin G), Serum: 420 mg/dL — ABNORMAL LOW (ref 690–1700)
IgM, Serum: 18 mg/dL — ABNORMAL LOW (ref 52–322)
Total Protein, Serum Electrophoresis: 6 g/dL — ABNORMAL LOW (ref 6.1–8.1)

## 2015-02-07 ENCOUNTER — Telehealth: Payer: Self-pay | Admitting: *Deleted

## 2015-02-07 ENCOUNTER — Telehealth: Payer: Self-pay | Admitting: Hematology and Oncology

## 2015-02-07 ENCOUNTER — Ambulatory Visit (HOSPITAL_BASED_OUTPATIENT_CLINIC_OR_DEPARTMENT_OTHER): Payer: Medicare Other | Admitting: Hematology and Oncology

## 2015-02-07 ENCOUNTER — Ambulatory Visit (HOSPITAL_BASED_OUTPATIENT_CLINIC_OR_DEPARTMENT_OTHER): Payer: Medicare Other

## 2015-02-07 ENCOUNTER — Encounter: Payer: Self-pay | Admitting: Hematology and Oncology

## 2015-02-07 VITALS — BP 146/83 | HR 86 | Temp 98.2°F | Resp 18 | Ht 61.0 in | Wt 131.4 lb

## 2015-02-07 DIAGNOSIS — G620 Drug-induced polyneuropathy: Secondary | ICD-10-CM

## 2015-02-07 DIAGNOSIS — Z5112 Encounter for antineoplastic immunotherapy: Secondary | ICD-10-CM | POA: Diagnosis not present

## 2015-02-07 DIAGNOSIS — M792 Neuralgia and neuritis, unspecified: Secondary | ICD-10-CM

## 2015-02-07 DIAGNOSIS — C9000 Multiple myeloma not having achieved remission: Secondary | ICD-10-CM | POA: Diagnosis not present

## 2015-02-07 DIAGNOSIS — T451X5A Adverse effect of antineoplastic and immunosuppressive drugs, initial encounter: Secondary | ICD-10-CM

## 2015-02-07 DIAGNOSIS — G5792 Unspecified mononeuropathy of left lower limb: Secondary | ICD-10-CM

## 2015-02-07 DIAGNOSIS — I10 Essential (primary) hypertension: Secondary | ICD-10-CM | POA: Diagnosis not present

## 2015-02-07 DIAGNOSIS — R112 Nausea with vomiting, unspecified: Secondary | ICD-10-CM

## 2015-02-07 MED ORDER — BORTEZOMIB CHEMO SQ INJECTION 3.5 MG (2.5MG/ML)
1.3000 mg/m2 | Freq: Once | INTRAMUSCULAR | Status: AC
  Administered 2015-02-07: 2 mg via SUBCUTANEOUS
  Filled 2015-02-07: qty 2

## 2015-02-07 MED ORDER — ONDANSETRON HCL 8 MG PO TABS: 8.0000 mg | ORAL_TABLET | Freq: Once | ORAL | Status: DC

## 2015-02-07 NOTE — Assessment & Plan Note (Signed)
She has very mild grade 1 neuropathy which I think is from Velcade. We will continue treatment without dose adjustment for now as she will be completing her treatment next week.

## 2015-02-07 NOTE — Assessment & Plan Note (Signed)
She has excellent response to treatment albeit with multiple different side effects After she completes 4 cycles of therapy, she will be referred back to [facility] to consider autologous stem cell transplant  She will receive first dose Zometa next week. She just obtain dental clearance. I have not made a return appointment for the patient to come back but await for further instructions after her appointment to [facility].

## 2015-02-07 NOTE — Telephone Encounter (Signed)
LVM for Urban Gibson, BMT Coordinator at [facility].  Informed her of last day 4th cycle of chemo will be done on 7/8 so she can plan to get pt scheduled for eval of BMT.

## 2015-02-07 NOTE — Progress Notes (Signed)
[place] OFFICE PROGRESS NOTE  Patient Care Team: Josetta Huddle, MD as PCP - General (Internal Medicine)  SUMMARY OF ONCOLOGIC HISTORY: Oncology History    M-protein 0.69 gm/dl IFIX - IgG, Kappa IgG - 868 IgA - 19 IgM - < 20 Kappa - 21 Lambda - 5.7  09/06/2014 - Bone marrow aspirate and biopsy:   Normocellular marrow for age (40%) with a small monoclonal plasma cell population (1% on aspirate). Karyotype 3, XX  FISH Negative for myeloma associated changes  09/12/2014 - PET/CT  Two regions that are concerning for disease, one adjacent/involving the left ninth rib and one in the marrow of the right femur, in this patient with history of plasmacytoma.      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    INTERVAL HISTORY: Please see below for problem oriented charting. She returns for further follow-up. Her eyelid infection has resolved. She feels well. Weakness is improving. She is eating better. She denies further nausea and vomiting.   she complains of very mild peripheral  neuropathy but it does not bother her.   REVIEW OF SYSTEMS:   Constitutional: Denies fevers, chills or abnormal weight loss Eyes: Denies blurriness of vision Ears, nose, mouth, throat, and face: Denies mucositis or sore throat Respiratory: Denies cough, dyspnea or wheezes Cardiovascular: Denies palpitation, chest discomfort or lower extremity swelling Gastrointestinal:  Denies nausea, heartburn or change in bowel habits Skin: Denies abnormal skin rashes Lymphatics: Denies new lymphadenopathy or easy bruising Neurological:Denies numbness, tingling or new weaknesses Behavioral/Psych: Mood is stable, no new changes  All other systems were reviewed with the patient and are negative.  I have reviewed the past medical history, past surgical history, social history and family history with the patient and they are unchanged from previous note.  ALLERGIES:  is allergic to codeine and ibuprofen.  MEDICATIONS:  Current Outpatient Prescriptions  Medication Sig Dispense Refill  . acyclovir (ZOVIRAX) 400 MG tablet Take 1 tablet (400 mg total) by mouth 2 (two) times daily. 60 tablet 3  . aspirin EC 81 MG tablet Take 81 mg by mouth daily with breakfast.    . Cholecalciferol (VITAMIN D) 2000 UNITS tablet Take 2,000 Units by mouth daily with breakfast.    . dexamethasone (DECADRON) 4 MG tablet Take 5 tablets (20 mg total) by mouth once a week. (Patient taking differently: Take 20 mg by mouth every Tuesday. ) 80 tablet 3  . doxycycline (VIBRA-TABS) 100 MG tablet Take 100 mg by mouth 2 (two) times daily.  0  . Glycerin, Laxative, 1  G SUPP Place 1 suppository (1 g total) rectally daily. 12 suppository 3  . lenalidomide (REVLIMID) 15 MG capsule Take 1 cap daily for 14 days, rest 7 days 14 capsule 0  . omeprazole (PRILOSEC) 20 MG capsule Take 1 capsule (20 mg total) by mouth daily. 30 capsule 6  . ondansetron (ZOFRAN) 4 MG tablet Take 1 tablet (4 mg total) by mouth every 6 (six) hours. 12 tablet 0  .  ondansetron (ZOFRAN-ODT) 8 MG disintegrating tablet Take 8 mg by mouth every 8 (eight) hours as needed for nausea or vomiting.   0  . oxycodone (OXY-IR) 5 MG capsule Take 1 capsule (5 mg total) by mouth every 4 (four) hours as needed. 60 capsule 0  . prochlorperazine (COMPAZINE) 10 MG tablet Take 1 tablet (10 mg total) by mouth every 6 (six) hours as needed (Nausea or vomiting). 30 tablet 1  . ranitidine (ZANTAC) 75 MG tablet Take 1 tablet (75 mg total) by mouth at bedtime. 30 tablet 6  . senna (SENOKOT) 8.6 MG tablet Take 2 tablets (17.2 mg total) by mouth 3 (three) times daily. (Patient taking differently: Take 1 tablet by mouth at bedtime as needed for constipation. ) 180 tablet 3  . tobramycin (TOBREX) 0.3 % ophthalmic solution   0  . amoxicillin (AMOXIL) 500 MG capsule   0   No current facility-administered medications for this visit.   Facility-Administered Medications Ordered in Other Visits  Medication Dose Route Frequency Provider Last Rate Last Dose  . ondansetron (ZOFRAN) tablet 8 mg  8 mg Oral Once [name], MD   8 mg at 02/07/15 1234    PHYSICAL EXAMINATION: ECOG PERFORMANCE STATUS: 1 - Symptomatic but completely ambulatory  Filed Vitals:   02/07/15 1100  BP: 146/83  Pulse: 86  Temp: 98.2 F (36.8 C)  Resp: 18   Filed Weights   02/07/15 1100  Weight: 131 lb 6.4 oz (59.603 kg)    GENERAL:alert, no distress and comfortable SKIN: skin color, texture, turgor are normal, no rashes or significant lesions EYES: normal, Conjunctiva are pink and non-injected, sclera clear OROPHARYNX:no exudate, no erythema and lips, buccal mucosa, and tongue normal  NECK: supple, thyroid normal size, non-tender, without nodularity LYMPH:  no palpable lymphadenopathy in the cervical, axillary or inguinal LUNGS: clear to auscultation and percussion with normal breathing effort HEART: regular rate & rhythm and no murmurs and no lower extremity edema ABDOMEN:abdomen soft, non-tender and  normal bowel sounds Musculoskeletal:no cyanosis of digits and no clubbing  NEURO: alert & oriented x 3 with fluent speech, no focal motor/sensory deficits  LABORATORY DATA:  I have reviewed the data as listed    Component Value Date/Time   NA 141 02/04/2015 1150   NA 142 12/15/2014 1729   K 3.9 02/04/2015 1150   K 2.6* 12/15/2014 1729   CL 108 12/15/2014 1729   CO2 26 02/04/2015 1150   CO2 25 12/15/2014 1729   GLUCOSE 100 02/04/2015 1150   GLUCOSE 81 12/15/2014 1729   BUN 10.5 02/04/2015 1150   BUN 5* 12/15/2014 1729   CREATININE 1.1 02/04/2015 1150   CREATININE 0.75 12/15/2014 1729   CALCIUM 9.5 02/04/2015 1150   CALCIUM 8.1* 12/15/2014 1729   PROT 6.3* 02/04/2015 1150   ALBUMIN 3.6 02/04/2015 1150   AST 16 02/04/2015 1150   ALT 9 02/04/2015 1150   ALKPHOS 88 02/04/2015 1150   BILITOT 0.43 02/04/2015 1150   GFRNONAA >60 12/15/2014 1729   GFRAA >60 12/15/2014 1729  No results found for: SPEP, UPEP  Lab Results  Component Value Date   WBC 5.8 02/04/2015   NEUTROABS 4.8 02/04/2015   HGB 11.9 02/04/2015   HCT 36.8 02/04/2015   MCV 87.0 02/04/2015   PLT 327 02/04/2015      Chemistry      Component Value Date/Time   NA 141 02/04/2015 1150   NA 142 12/15/2014 1729   K 3.9 02/04/2015 1150   K 2.6* 12/15/2014 1729   CL 108 12/15/2014 1729   CO2 26 02/04/2015 1150   CO2 25 12/15/2014 1729   BUN 10.5 02/04/2015 1150   BUN 5* 12/15/2014 1729   CREATININE 1.1 02/04/2015 1150   CREATININE 0.75 12/15/2014 1729      Component Value Date/Time   CALCIUM 9.5 02/04/2015 1150   CALCIUM 8.1* 12/15/2014 1729   ALKPHOS 88 02/04/2015 1150   AST 16 02/04/2015 1150   ALT 9 02/04/2015 1150   BILITOT 0.43 02/04/2015 1150       ASSESSMENT & PLAN:  Multiple myeloma She has excellent response to treatment albeit with multiple different side effects After she completes 4 cycles of therapy, she will be referred back to [facility] to consider  autologous stem cell transplant  She will receive first dose Zometa next week. She just obtain dental clearance. I have not made a return appointment for the patient to come back but await for further instructions after her appointment to [facility].    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 We will continue treatment without dose adjustment for now as she will be completing her treatment next week.  Neuropathic pain of left flank She has intermittent leg pain and muscle aches which I think could be due to potential hypocalcemia. Serum calcium is normal but I recommend she starts taking vitamin D and calcium supplement. Next week, when we start her on Zometa, she'll need to be on chronic supplement long-term.  Essential hypertension Her blood pressure now is getting high. Previously, I discontinued most of her medications due to dehydration. I recommend she restart her prescription lisinopril at half the previous dose at nighttime.  Chemotherapy induced nausea and vomiting Most of her symptoms of nausea and vomiting has resolved. We will be stopping treatment next week after cycle 4 is completed. She will continue to take antiemetics as needed.   No orders of the defined types were placed in this encounter.   All questions were answered. The patient knows to call the clinic with any problems, questions or concerns. No barriers to learning was detected. I spent 25 minutes counseling the patient face to face. The total time spent in the appointment was 30 minutes and more than 50% was on counseling and review of test results     [facility], Jozsef Wescoat, MD 02/07/2015 1:20 PM

## 2015-02-07 NOTE — Assessment & Plan Note (Signed)
She has intermittent leg pain and muscle aches which I think could be due to potential hypocalcemia. Serum calcium is normal but I recommend she starts taking vitamin D and calcium supplement. Next week, when we start her on Zometa, she'll need to be on chronic supplement long-term.

## 2015-02-07 NOTE — Assessment & Plan Note (Signed)
Her blood pressure now is getting high. Previously, I discontinued most of her medications due to dehydration. I recommend she restart her prescription lisinopril at half the previous dose at nighttime.

## 2015-02-07 NOTE — Patient Instructions (Addendum)
[facility] Discharge Instructions for Patients Receiving Chemotherapy  Today you received the following chemotherapy agents :  Velcade.  To help prevent nausea and vomiting after your treatment, we encourage you to take your nausea medication as prescribed.   If you develop nausea and vomiting that is not controlled by your nausea medication, call the clinic.   BELOW ARE SYMPTOMS THAT SHOULD BE REPORTED IMMEDIATELY:  *FEVER GREATER THAN 100.5 F  *CHILLS WITH OR WITHOUT FEVER  NAUSEA AND VOMITING THAT IS NOT CONTROLLED WITH YOUR NAUSEA MEDICATION  *UNUSUAL SHORTNESS OF BREATH  *UNUSUAL BRUISING OR BLEEDING  TENDERNESS IN MOUTH AND THROAT WITH OR WITHOUT PRESENCE OF ULCERS  *URINARY PROBLEMS  *BOWEL PROBLEMS  UNUSUAL RASH Items with * indicate a potential emergency and should be followed up as soon as possible.  Feel free to call the clinic you have any questions or concerns. The clinic phone number is [phone].  Please show the [place] at check-in to the Emergency Department and triage nurse.  Zoledronic Acid injection (Hypercalcemia, Oncology) What is this medicine? ZOLEDRONIC ACID (ZOE le dron ik AS id) lowers the amount of calcium loss from bone. It is used to treat too much calcium in your blood from cancer. It is also used to prevent complications of cancer that has spread to the bone. This medicine may be used for other purposes; ask your health care provider or pharmacist if you have questions. COMMON BRAND NAME(S): Zometa What should I tell my health care provider before I take this medicine? They need to know if you have any of these conditions: -aspirin-sensitive asthma -cancer, especially if you are receiving medicines used to treat cancer -dental disease or wear dentures -infection -kidney disease -receiving corticosteroids like dexamethasone or prednisone -an unusual or allergic reaction to zoledronic acid, other medicines,  foods, dyes, or preservatives -pregnant or trying to get pregnant -breast-feeding How should I use this medicine? This medicine is for infusion into a vein. It is given by a health care professional in a hospital or clinic setting. Talk to your pediatrician regarding the use of this medicine in children. Special care may be needed. Overdosage: If you think you have taken too much of this medicine contact a poison control center or emergency room at once. NOTE: This medicine is only for you. Do not share this medicine with others. What if I miss a dose? It is important not to miss your dose. Call your doctor or health care professional if you are unable to keep an appointment. What may interact with this medicine? -certain antibiotics given by injection -NSAIDs, medicines for pain and inflammation, like ibuprofen or naproxen -some diuretics like bumetanide, furosemide -teriparatide -thalidomide This list may not describe all possible interactions. Give your health care provider a list of all the medicines, herbs, non-prescription drugs, or dietary supplements you use. Also tell them if you smoke, drink alcohol, or use illegal drugs. Some items may interact with your medicine. What should I watch for while using this medicine? Visit your doctor or health care professional for regular checkups. It may be some time before you see the benefit from this medicine. Do not stop taking your medicine unless your doctor tells you to. Your doctor may order blood tests or other tests to see how you are doing. Women should inform their doctor if they wish to become pregnant or think they might be pregnant. There is a potential for serious side effects to an unborn child. Talk  to your health care professional or pharmacist for more information. You should make sure that you get enough calcium and vitamin D while you are taking this medicine. Discuss the foods you eat and the vitamins you take with your health  care professional. Some people who take this medicine have severe bone, joint, and/or muscle pain. This medicine may also increase your risk for jaw problems or a broken thigh bone. Tell your doctor right away if you have severe pain in your jaw, bones, joints, or muscles. Tell your doctor if you have any pain that does not go away or that gets worse. Tell your dentist and dental surgeon that you are taking this medicine. You should not have major dental surgery while on this medicine. See your dentist to have a dental exam and fix any dental problems before starting this medicine. Take good care of your teeth while on this medicine. Make sure you see your dentist for regular follow-up appointments. What side effects may I notice from receiving this medicine? Side effects that you should report to your doctor or health care professional as soon as possible: -allergic reactions like skin rash, itching or hives, swelling of the face, lips, or tongue -anxiety, confusion, or depression -breathing problems -changes in vision -eye pain -feeling faint or lightheaded, falls -jaw pain, especially after dental work -mouth sores -muscle cramps, stiffness, or weakness -trouble passing urine or change in the amount of urine Side effects that usually do not require medical attention (report to your doctor or health care professional if they continue or are bothersome): -bone, joint, or muscle pain -constipation -diarrhea -fever -hair loss -irritation at site where injected -loss of appetite -nausea, vomiting -stomach upset -trouble sleeping -trouble swallowing -weak or tired This list may not describe all possible side effects. Call your doctor for medical advice about side effects. You may report side effects to FDA at 1-800-FDA-1088. Where should I keep my medicine? This drug is given in a hospital or clinic and will not be stored at home. NOTE: This sheet is a summary. It may not cover all  possible information. If you have questions about this medicine, talk to your doctor, pharmacist, or health care provider.  2015, Elsevier/Gold Standard. (2013-01-04 13:03:13)

## 2015-02-07 NOTE — Assessment & Plan Note (Signed)
Most of her symptoms of nausea and vomiting has resolved. We will be stopping treatment next week after cycle 4 is completed. She will continue to take antiemetics as needed.

## 2015-02-07 NOTE — Telephone Encounter (Signed)
Gave and printed appt sched and avs for pt for July  °

## 2015-02-11 ENCOUNTER — Ambulatory Visit (HOSPITAL_BASED_OUTPATIENT_CLINIC_OR_DEPARTMENT_OTHER): Payer: Medicare Other

## 2015-02-11 ENCOUNTER — Other Ambulatory Visit (HOSPITAL_BASED_OUTPATIENT_CLINIC_OR_DEPARTMENT_OTHER): Payer: Medicare Other

## 2015-02-11 VITALS — BP 119/76 | HR 79 | Temp 98.7°F | Resp 18

## 2015-02-11 DIAGNOSIS — C9000 Multiple myeloma not having achieved remission: Secondary | ICD-10-CM

## 2015-02-11 DIAGNOSIS — N19 Unspecified kidney failure: Secondary | ICD-10-CM

## 2015-02-11 DIAGNOSIS — Z5112 Encounter for antineoplastic immunotherapy: Secondary | ICD-10-CM

## 2015-02-11 LAB — COMPREHENSIVE METABOLIC PANEL (CC13)
ALT: 12 U/L (ref 0–55)
AST: 13 U/L (ref 5–34)
Albumin: 3.2 g/dL — ABNORMAL LOW (ref 3.5–5.0)
Alkaline Phosphatase: 76 U/L (ref 40–150)
Anion Gap: 10 mEq/L (ref 3–11)
BUN: 11.8 mg/dL (ref 7.0–26.0)
CO2: 21 mEq/L — ABNORMAL LOW (ref 22–29)
Calcium: 9.1 mg/dL (ref 8.4–10.4)
Chloride: 107 mEq/L (ref 98–109)
Creatinine: 1.1 mg/dL (ref 0.6–1.1)
EGFR: 54 mL/min/{1.73_m2} — ABNORMAL LOW (ref >90)
Glucose: 113 mg/dl (ref 70–140)
Potassium: 3.4 mEq/L — ABNORMAL LOW (ref 3.5–5.1)
Sodium: 139 mEq/L (ref 136–145)
Total Bilirubin: 0.27 mg/dL (ref 0.20–1.20)
Total Protein: 5.5 g/dL — ABNORMAL LOW (ref 6.4–8.3)

## 2015-02-11 LAB — CBC WITH DIFFERENTIAL/PLATELET
BASO%: 0.4 % (ref 0.0–2.0)
Basophils Absolute: 0 10*3/uL (ref 0.0–0.1)
EOS%: 4.9 % (ref 0.0–7.0)
Eosinophils Absolute: 0.3 10*3/uL (ref 0.0–0.5)
HCT: 35 % (ref 34.8–46.6)
HGB: 11.4 g/dL — ABNORMAL LOW (ref 11.6–15.9)
LYMPH%: 25.2 % (ref 14.0–49.7)
MCH: 28 pg (ref 25.1–34.0)
MCHC: 32.6 g/dL (ref 31.5–36.0)
MCV: 86 fL (ref 79.5–101.0)
MONO#: 0.4 10*3/uL (ref 0.1–0.9)
MONO%: 6.3 % (ref 0.0–14.0)
NEUT#: 3.5 10*3/uL (ref 1.5–6.5)
NEUT%: 63.2 % (ref 38.4–76.8)
Platelets: 169 10*3/uL (ref 145–400)
RBC: 4.07 10*6/uL (ref 3.70–5.45)
RDW: 15.3 % — ABNORMAL HIGH (ref 11.2–14.5)
WBC: 5.6 10*3/uL (ref 3.9–10.3)
lymph#: 1.4 10*3/uL (ref 0.9–3.3)
nRBC: 0 % (ref 0–0)

## 2015-02-11 MED ORDER — SODIUM CHLORIDE 0.9 % IV SOLN
Freq: Once | INTRAVENOUS | Status: AC
  Administered 2015-02-11: 15:00:00 via INTRAVENOUS

## 2015-02-11 MED ORDER — ONDANSETRON HCL 8 MG PO TABS
ORAL_TABLET | ORAL | Status: AC
  Filled 2015-02-11: qty 1

## 2015-02-11 MED ORDER — BORTEZOMIB CHEMO SQ INJECTION 3.5 MG (2.5MG/ML)
1.3000 mg/m2 | Freq: Once | INTRAMUSCULAR | Status: AC
  Administered 2015-02-11: 2 mg via SUBCUTANEOUS
  Filled 2015-02-11: qty 2

## 2015-02-11 MED ORDER — ZOLEDRONIC ACID 4 MG/5ML IV CONC
3.3000 mg | Freq: Once | INTRAVENOUS | Status: AC
  Administered 2015-02-11: 3.3 mg via INTRAVENOUS
  Filled 2015-02-11: qty 4.13

## 2015-02-11 MED ORDER — ONDANSETRON HCL 8 MG PO TABS
8.0000 mg | ORAL_TABLET | Freq: Once | ORAL | Status: AC
  Administered 2015-02-11: 8 mg via ORAL

## 2015-02-11 NOTE — Patient Instructions (Addendum)
[facility] Discharge Instructions for Patients Receiving Chemotherapy  Today you received the following chemotherapy agents; Velcade.   To help prevent nausea and vomiting after your treatment, we encourage you to take your nausea medication as directed.    If you develop nausea and vomiting that is not controlled by your nausea medication, call the clinic.   BELOW ARE SYMPTOMS THAT SHOULD BE REPORTED IMMEDIATELY:  *FEVER GREATER THAN 100.5 F  *CHILLS WITH OR WITHOUT FEVER  NAUSEA AND VOMITING THAT IS NOT CONTROLLED WITH YOUR NAUSEA MEDICATION  *UNUSUAL SHORTNESS OF BREATH  *UNUSUAL BRUISING OR BLEEDING  TENDERNESS IN MOUTH AND THROAT WITH OR WITHOUT PRESENCE OF ULCERS  *URINARY PROBLEMS  *BOWEL PROBLEMS  UNUSUAL RASH Items with * indicate a potential emergency and should be followed up as soon as possible.  Feel free to call the clinic you have any questions or concerns. The clinic phone number is [phone].  Please show the [place] at check-in to the Emergency Department and triage nurse.   Zoledronic Acid injection (Hypercalcemia, Oncology) What is this medicine? ZOLEDRONIC ACID (ZOE le dron ik AS id) lowers the amount of calcium loss from bone. It is used to treat too much calcium in your blood from cancer. It is also used to prevent complications of cancer that has spread to the bone. This medicine may be used for other purposes; ask your health care provider or pharmacist if you have questions. COMMON BRAND NAME(S): Zometa What should I tell my health care provider before I take this medicine? They need to know if you have any of these conditions: -aspirin-sensitive asthma -cancer, especially if you are receiving medicines used to treat cancer -dental disease or wear dentures -infection -kidney disease -receiving corticosteroids like dexamethasone or prednisone -an unusual or allergic reaction to zoledronic acid, other medicines,  foods, dyes, or preservatives -pregnant or trying to get pregnant -breast-feeding How should I use this medicine? This medicine is for infusion into a vein. It is given by a health care professional in a hospital or clinic setting. Talk to your pediatrician regarding the use of this medicine in children. Special care may be needed. Overdosage: If you think you have taken too much of this medicine contact a poison control center or emergency room at once. NOTE: This medicine is only for you. Do not share this medicine with others. What if I miss a dose? It is important not to miss your dose. Call your doctor or health care professional if you are unable to keep an appointment. What may interact with this medicine? -certain antibiotics given by injection -NSAIDs, medicines for pain and inflammation, like ibuprofen or naproxen -some diuretics like bumetanide, furosemide -teriparatide -thalidomide This list may not describe all possible interactions. Give your health care provider a list of all the medicines, herbs, non-prescription drugs, or dietary supplements you use. Also tell them if you smoke, drink alcohol, or use illegal drugs. Some items may interact with your medicine. What should I watch for while using this medicine? Visit your doctor or health care professional for regular checkups. It may be some time before you see the benefit from this medicine. Do not stop taking your medicine unless your doctor tells you to. Your doctor may order blood tests or other tests to see how you are doing. Women should inform their doctor if they wish to become pregnant or think they might be pregnant. There is a potential for serious side effects to an unborn child.  Talk to your health care professional or pharmacist for more information. You should make sure that you get enough calcium and vitamin D while you are taking this medicine. Discuss the foods you eat and the vitamins you take with your health  care professional. Some people who take this medicine have severe bone, joint, and/or muscle pain. This medicine may also increase your risk for jaw problems or a broken thigh bone. Tell your doctor right away if you have severe pain in your jaw, bones, joints, or muscles. Tell your doctor if you have any pain that does not go away or that gets worse. Tell your dentist and dental surgeon that you are taking this medicine. You should not have major dental surgery while on this medicine. See your dentist to have a dental exam and fix any dental problems before starting this medicine. Take good care of your teeth while on this medicine. Make sure you see your dentist for regular follow-up appointments. What side effects may I notice from receiving this medicine? Side effects that you should report to your doctor or health care professional as soon as possible: -allergic reactions like skin rash, itching or hives, swelling of the face, lips, or tongue -anxiety, confusion, or depression -breathing problems -changes in vision -eye pain -feeling faint or lightheaded, falls -jaw pain, especially after dental work -mouth sores -muscle cramps, stiffness, or weakness -trouble passing urine or change in the amount of urine Side effects that usually do not require medical attention (report to your doctor or health care professional if they continue or are bothersome): -bone, joint, or muscle pain -constipation -diarrhea -fever -hair loss -irritation at site where injected -loss of appetite -nausea, vomiting -stomach upset -trouble sleeping -trouble swallowing -weak or tired This list may not describe all possible side effects. Call your doctor for medical advice about side effects. You may report side effects to FDA at 1-800-FDA-1088. Where should I keep my medicine? This drug is given in a hospital or clinic and will not be stored at home. NOTE: This sheet is a summary. It may not cover all  possible information. If you have questions about this medicine, talk to your doctor, pharmacist, or health care provider.  2015, Elsevier/Gold Standard. (2013-01-04 13:03:13)

## 2015-02-14 ENCOUNTER — Ambulatory Visit (HOSPITAL_BASED_OUTPATIENT_CLINIC_OR_DEPARTMENT_OTHER): Payer: Medicare Other

## 2015-02-14 ENCOUNTER — Telehealth: Payer: Self-pay | Admitting: *Deleted

## 2015-02-14 ENCOUNTER — Ambulatory Visit (HOSPITAL_BASED_OUTPATIENT_CLINIC_OR_DEPARTMENT_OTHER): Payer: Medicare Other | Admitting: Nurse Practitioner

## 2015-02-14 ENCOUNTER — Encounter: Payer: Self-pay | Admitting: Nurse Practitioner

## 2015-02-14 VITALS — BP 144/74 | HR 85 | Temp 98.0°F | Resp 18

## 2015-02-14 DIAGNOSIS — Z5112 Encounter for antineoplastic immunotherapy: Secondary | ICD-10-CM

## 2015-02-14 DIAGNOSIS — C9000 Multiple myeloma not having achieved remission: Secondary | ICD-10-CM

## 2015-02-14 DIAGNOSIS — R233 Spontaneous ecchymoses: Secondary | ICD-10-CM

## 2015-02-14 MED ORDER — ONDANSETRON HCL 8 MG PO TABS: 8.0000 mg | ORAL_TABLET | Freq: Once | ORAL | Status: DC

## 2015-02-14 MED ORDER — BORTEZOMIB CHEMO SQ INJECTION 3.5 MG (2.5MG/ML)
1.3000 mg/m2 | Freq: Once | INTRAMUSCULAR | Status: AC
Start: 2015-02-14 — End: 2015-02-14
  Administered 2015-02-14: 2 mg via SUBCUTANEOUS
  Filled 2015-02-14: qty 2

## 2015-02-14 NOTE — Patient Instructions (Signed)

## 2015-02-14 NOTE — Assessment & Plan Note (Signed)
Patient is reporting development of a trace rash to her left inner thigh within the past few days.  She denies any itching or pain to the rash.  She also denies any known injury or trauma to the rash.  On exam  It appears that the rash is actually mild, scattered large petechiae to the left inner thigh.  There is no surrounding erythema, edema, warmth, tenderness, or red streaks.  Platelet count stable at 169 today.  Most likely development of trace petechiae is secondary to either multiple myeloma or recent Velcade.  Advised to monitor; let us know if worsening symptoms develop.

## 2015-02-14 NOTE — Progress Notes (Signed)
Patient states that she took her Zofran at home at approx 8 am and does not feel the need for it now; only four hours later. Pt reports developing a rash on her left thigh; no pain or itching; after receiving Zometa.  Selena Lesser NP aware and will come to infusion to assess. Pt reports that she has not heard back from [facility] regarding scheduling an appointment, Loren; Dr. Calton Dach nurse; aware.

## 2015-02-14 NOTE — Progress Notes (Signed)
SYMPTOM MANAGEMENT CLINIC   HPI: Jessica [name] 64 y.o. female diagnosed with multiple myeloma.  Recently completed Velcade/dexamethasone therapy.  Currently awaiting referral per [facility] in regards to possible cartilaginous stem cell transplant.  Patient is reporting development of a trace rash to her left inner thigh within the past few days.  She denies any itching or pain to the rash.  She also denies any known injury or trauma to the rash.  HPI  ROS  Past Medical History  Diagnosis Date  . MVP (mitral valve prolapse)     req prophylaxis  . Multiple myeloma 11/19/2014  . Other constipation 12/03/2014  . Multiple myeloma   . Multiple myeloma     Past Surgical History  Procedure Laterality Date  . Laser ablation of condylomas  1990    CIN 1 cervix  . Cataract extraction, bilateral  10/2010    Implants(ReSTOR) Bilat    has Multiple myeloma; Prerenal renal failure; Steroid withdrawal syndrome following proper administration; Neuropathic pain of left flank; Vomiting without nausea, not intractable; Nausea with vomiting; Hypotension; Dehydration; Weakness; Hyperphosphatemia; Other constipation; Chemotherapy induced nausea and vomiting; Superficial thrombophlebitis of upper extremity; Sty, external; Anemia in neoplastic disease; Infection of eyelid; Protein calorie malnutrition; Neuropathy due to chemotherapeutic drug; Essential hypertension; and Petechiae on her problem list.    is allergic to codeine and ibuprofen.    Medication List       This list is accurate as of: 02/14/15 10:44 PM.  Always use your most recent med list.               acyclovir 400 MG tablet  Commonly known as:  ZOVIRAX  Take 1 tablet (400 mg total) by mouth 2 (two) times daily.     amoxicillin 500 MG capsule  Commonly known as:  AMOXIL     aspirin EC 81 MG tablet  Take 81 mg by mouth daily with breakfast.     dexamethasone 4 MG tablet  Commonly known as:  DECADRON    Take 5 tablets (20 mg total) by mouth once a week.     doxycycline 100 MG tablet  Commonly known as:  VIBRA-TABS  Take 100 mg by mouth 2 (two) times daily.     Glycerin (Laxative) 1 G Supp  Place 1 suppository (1 g total) rectally daily.     lenalidomide 15 MG capsule  Commonly known as:  REVLIMID  Take 1 cap daily for 14 days, rest 7 days     omeprazole 20 MG capsule  Commonly known as:  PRILOSEC  Take 1 capsule (20 mg total) by mouth daily.     ondansetron 4 MG tablet  Commonly known as:  ZOFRAN  Take 1 tablet (4 mg total) by mouth every 6 (six) hours.     ondansetron 8 MG disintegrating tablet  Commonly known as:  ZOFRAN-ODT  Take 8 mg by mouth every 8 (eight) hours as needed for nausea or vomiting.     oxycodone 5 MG capsule  Commonly known as:  OXY-IR  Take 1 capsule (5 mg total) by mouth every 4 (four) hours as needed.     prochlorperazine 10 MG tablet  Commonly known as:  COMPAZINE  Take 1 tablet (10 mg total) by mouth every 6 (six) hours as needed (Nausea or vomiting).     ranitidine 75 MG tablet  Commonly known as:  ZANTAC  Take 1 tablet (75 mg total) by mouth at bedtime.  senna 8.6 MG tablet  Commonly known as:  SENOKOT  Take 2 tablets (17.2 mg total) by mouth 3 (three) times daily.     tobramycin 0.3 % ophthalmic solution  Commonly known as:  TOBREX     Vitamin D 2000 UNITS tablet  Take 2,000 Units by mouth daily with breakfast.         PHYSICAL EXAMINATION  Oncology Vitals 02/14/2015 02/11/2015 02/07/2015 02/04/2015 01/14/2015 01/10/2015 01/07/2015  Height - - 155 cm - 155 cm - -  Weight - - 59.603 kg - 59.285 kg - -  Weight (lbs) - - 131 lbs 6 oz - 130 lbs 11 oz - -  BMI (kg/m2) - - 24.83 kg/m2 - 24.7 kg/m2 - -  Temp 98 98.7 98.2 98.9 98.2 98.5 97.2  Pulse 85 79 86 84 77 75 77  Resp 18 18 18 24 18 18 18   SpO2 100 - 100 97 100 - 99  BSA (m2) - - 1.6 m2 - 1.6 m2 - -   BP Readings from Last 3 Encounters:  02/14/15 144/74  02/11/15 119/76  02/07/15  146/83    Physical Exam  Constitutional: She is oriented to person, place, and time and well-developed, well-nourished, and in no distress.  HENT:  Head: Normocephalic and atraumatic.  Eyes: Conjunctivae and EOM are normal. Pupils are equal, round, and reactive to light. Right eye exhibits no discharge. Left eye exhibits no discharge. No scleral icterus.  Neck: Normal range of motion.  Pulmonary/Chest: Effort normal. No respiratory distress.  Musculoskeletal: Normal range of motion. She exhibits no edema or tenderness.  Neurological: She is alert and oriented to person, place, and time.  Skin: Skin is warm and dry. No rash noted. No erythema. No pallor.  On exam  It appears that the rash is actually mild, scattered large petechiae to the left inner thigh.  There is no surrounding erythema, edema, warmth, tenderness, or red streaks.  Platelet count stable at 169 today.  Most likely development of trace petechiae is secondary to either multiple myeloma or recent Velcade.    Psychiatric: Affect normal.  Nursing note and vitals reviewed.   LABORATORY DATA:. No visits with results within 3 Day(s) from this visit. Latest known visit with results is:  Appointment on 02/11/2015  Component Date Value Ref Range Status  . WBC 02/11/2015 5.6  3.9 - 10.3 10e3/uL Final  . NEUT# 02/11/2015 3.5  1.5 - 6.5 10e3/uL Final  . HGB 02/11/2015 11.4* 11.6 - 15.9 g/dL Final  . HCT 02/11/2015 35.0  34.8 - 46.6 % Final  . Platelets 02/11/2015 169  145 - 400 10e3/uL Corrected   Result amended from (185) (02/11/2015 2:47 PM) to (169).  . MCV 02/11/2015 86.0  79.5 - 101.0 fL Final  . MCH 02/11/2015 28.0  25.1 - 34.0 pg Final  . MCHC 02/11/2015 32.6  31.5 - 36.0 g/dL Final  . RBC 02/11/2015 4.07  3.70 - 5.45 10e6/uL Final  . RDW 02/11/2015 15.3* 11.2 - 14.5 % Final  . lymph# 02/11/2015 1.4  0.9 - 3.3 10e3/uL Final  . MONO# 02/11/2015 0.4  0.1 - 0.9 10e3/uL Final  . Eosinophils Absolute 02/11/2015 0.3  0.0 -  0.5 10e3/uL Final  . Basophils Absolute 02/11/2015 0.0  0.0 - 0.1 10e3/uL Final  . NEUT% 02/11/2015 63.2  38.4 - 76.8 % Final  . LYMPH% 02/11/2015 25.2  14.0 - 49.7 % Final  . MONO% 02/11/2015 6.3  0.0 - 14.0 % Final  . EOS%  02/11/2015 4.9  0.0 - 7.0 % Final  . BASO% 02/11/2015 0.4  0.0 - 2.0 % Final  . nRBC 02/11/2015 0  0 - 0 % Final  . Sodium 02/11/2015 139  136 - 145 mEq/L Final  . Potassium 02/11/2015 3.4* 3.5 - 5.1 mEq/L Final  . Chloride 02/11/2015 107  98 - 109 mEq/L Final  . CO2 02/11/2015 21* 22 - 29 mEq/L Final  . Glucose 02/11/2015 113  70 - 140 mg/dl Final  . BUN 02/11/2015 11.8  7.0 - 26.0 mg/dL Final  . Creatinine 02/11/2015 1.1  0.6 - 1.1 mg/dL Final  . Total Bilirubin 02/11/2015 0.27  0.20 - 1.20 mg/dL Final  . Alkaline Phosphatase 02/11/2015 76  40 - 150 U/L Final  . AST 02/11/2015 13  5 - 34 U/L Final  . ALT 02/11/2015 12  0 - 55 U/L Final  . Total Protein 02/11/2015 5.5* 6.4 - 8.3 g/dL Final  . Albumin 02/11/2015 3.2* 3.5 - 5.0 g/dL Final  . Calcium 02/11/2015 9.1  8.4 - 10.4 mg/dL Final  . Anion Gap 02/11/2015 10  3 - 11 mEq/L Final  . EGFR 02/11/2015 54* >90 ml/min/1.73 m2 Final   eGFR is calculated using the CKD-EPI Creatinine Equation (2009)     RADIOGRAPHIC STUDIES: No results found.  ASSESSMENT/PLAN:    Multiple myeloma Patient has completed her induction Velcade/dexamethasone therapy.  She has also received her first dose of Zometa; following recent dental clearance.  Patient is awaiting referral back to [facility] to consider cartilages stem cell transplant.  There are no follow-up appointments made here at the [facility] at this current time while awaiting further instruction per [facility].     Petechiae Patient is reporting development of a trace rash to her left inner thigh within the past few days.  She denies any itching or pain to the rash.  She also denies any known injury or trauma to the  rash.  On exam  It appears that the rash is actually mild, scattered large petechiae to the left inner thigh.  There is no surrounding erythema, edema, warmth, tenderness, or red streaks.  Platelet count stable at 169 today.  Most likely development of trace petechiae is secondary to either multiple myeloma or recent Velcade.  Advised to monitor; let us know if worsening symptoms develop.  Patient stated understanding of all instructions; and was in agreement with this plan of care. The patient knows to call the clinic with any problems, questions or concerns.   Review/collaboration with Dr. Alvy Bimler regarding all aspects of patient's visit today.   Total time spent with patient was 15 minutes;  with greater than 75 percent of that time spent in face to face counseling regarding patient's symptoms,  and coordination of care and follow up.  Disclaimer: This note was dictated with voice recognition software. Similar sounding words can inadvertently be transcribed and may not be corrected upon review.   Drue Second, NP 02/14/2015

## 2015-02-14 NOTE — Progress Notes (Signed)
Selena Lesser, NP at chairside to assess patient; labs reviewed from 02/11/15.  States area to left thigh is small amount of bruising, not a rash.  Pt instructed to call if bruising does not improve or gets worse; verbalized understanding.  Okay to proceed with treatment today per C. Berniece Salines, NP.

## 2015-02-14 NOTE — Assessment & Plan Note (Signed)
Patient has completed her induction Velcade/dexamethasone therapy.  She has also received her first dose of Zometa; following recent dental clearance.  Patient is awaiting referral back to [facility] to consider cartilages stem cell transplant.  There are no follow-up appointments made here at the [facility] at this current time while awaiting further instruction per [facility].

## 2015-02-14 NOTE — Telephone Encounter (Signed)
Pt stated she had not heard from [facility] regarding upcoming appointment for BM transplant.  Urban Gibson BM coordinator at [facility] called, left VM to call us and/or patient with appointment information.

## 2015-02-18 ENCOUNTER — Telehealth: Payer: Self-pay | Admitting: *Deleted

## 2015-02-18 NOTE — Telephone Encounter (Signed)
I called pt to see if she has heard about appt at [facility] yet.  She says she saw Dr. Cassell Clement yesterday at their [facility] clinic.  She says they are discussing a stem cell transplant and will be contacting her to arrange "a lot of tests."    Instructed pt to let us know if we can do anything for her.  She verbalized understanding.   Notified [facility] d/c'd at this time.  Will send new rx in future if restarted.

## 2015-03-07 ENCOUNTER — Telehealth: Payer: Self-pay | Admitting: *Deleted

## 2015-03-10 DIAGNOSIS — Z9484 Stem cells transplant status: Secondary | ICD-10-CM

## 2015-03-10 HISTORY — DX: Stem cells transplant status: Z94.84

## 2015-03-10 NOTE — Telephone Encounter (Signed)
VM from Urban Gibson, BMT coordinator at [facility], requesting copy of most recent mammogram and copy of letter/clearance from dentist for zometa.   Faxed copy of mammogram from 2015 to Urban Gibson [phone].   The letter from dentist is not scanned into our EMR yet so I do not have copy to send to [facility].   LVM for Indria Bishara informing her of this.   She may need to contact pt's dentist directly for the letter.

## 2015-03-14 ENCOUNTER — Telehealth: Payer: Self-pay

## 2015-03-14 DIAGNOSIS — T451X5A Adverse effect of antineoplastic and immunosuppressive drugs, initial encounter: Secondary | ICD-10-CM

## 2015-03-14 DIAGNOSIS — G620 Drug-induced polyneuropathy: Secondary | ICD-10-CM

## 2015-03-14 DIAGNOSIS — C9000 Multiple myeloma not having achieved remission: Secondary | ICD-10-CM

## 2015-03-14 MED ORDER — GABAPENTIN 300 MG PO CAPS: 300.0000 mg | ORAL_CAPSULE | Freq: Two times a day (BID) | ORAL | Status: DC

## 2015-03-14 NOTE — Telephone Encounter (Signed)
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 All pain medications can cause excessive sedation. The pain should get better in time since we stop velcade. I can prescribe a stronger pain medication in the mean time or try gabapentin 300 mg  BID. You can call it in if she is interested to try gabapentin  2) She need to refill acyclovir until the end of the year  3) She needs to tell me if she is going for BMT or now

## 2015-03-14 NOTE — Telephone Encounter (Signed)
Since  Cycle 4 of Velcade 02-04-15 through 02-14-15 she has experienced increased aches and tingling in her knees, kegs, feet and toes.  She states that some days are worse than others.  She rates the pain a 9/10. She uses the Oxycodone 5 mg tabs 1-2 times a day with minimal effect.  The medications makes her more loopy then anything.  Is there another medication than can be prescribed? She is taking Vitamin D 2000 units daiy and Tums 4 or more in a day as directed by Dr. Alvy Bimler.  Ms. Botkins finished her Acyclovir a couple of days ago.  Does she need to continue this? ( She does have refills)

## 2015-03-14 NOTE — Telephone Encounter (Signed)
Discussed medication options for neuropathy as noted below by Dr. Alvy Bimler. Jessica [name] will try the gabapentin.  Sent prescription to her pharmacy. Instructed Jessica [name] to take the Acyclovir as noted below by Dr. Alvy Bimler.  Jessica [name] stated that there was a little glitch in her EKG at [facility].  She needs to see a cardiologist there next week.  If she is cleared by the cardiologist, then she will begin her shots on 03-22-15 for stem cell production.

## 2015-04-01 ENCOUNTER — Other Ambulatory Visit: Payer: Self-pay | Admitting: Hematology and Oncology

## 2015-04-17 ENCOUNTER — Telehealth: Payer: Self-pay | Admitting: Hematology

## 2015-04-17 NOTE — Telephone Encounter (Signed)
per pof Dr office cld to make appts for pt-gave time & date-& fax# for lab orders

## 2015-04-18 ENCOUNTER — Other Ambulatory Visit: Payer: Self-pay | Admitting: *Deleted

## 2015-04-21 ENCOUNTER — Ambulatory Visit: Payer: Medicare Other | Admitting: Hematology and Oncology

## 2015-04-21 ENCOUNTER — Telehealth: Payer: Self-pay | Admitting: Hematology and Oncology

## 2015-04-21 ENCOUNTER — Encounter: Payer: Self-pay | Admitting: Hematology

## 2015-04-21 ENCOUNTER — Telehealth: Payer: Self-pay | Admitting: *Deleted

## 2015-04-21 ENCOUNTER — Other Ambulatory Visit: Payer: Medicare Other

## 2015-04-21 ENCOUNTER — Other Ambulatory Visit: Payer: Self-pay | Admitting: *Deleted

## 2015-04-21 ENCOUNTER — Telehealth: Payer: Self-pay | Admitting: Hematology

## 2015-04-21 ENCOUNTER — Other Ambulatory Visit: Payer: Self-pay | Admitting: Hematology and Oncology

## 2015-04-21 ENCOUNTER — Ambulatory Visit: Payer: Medicare Other | Admitting: Hematology

## 2015-04-21 DIAGNOSIS — C9000 Multiple myeloma not having achieved remission: Secondary | ICD-10-CM

## 2015-04-21 NOTE — Telephone Encounter (Signed)
Notified husband of appt r/s to 10:30 tomorrow morning.  Encouraged him to have pt come even if she is not feeling well.  He verbalized understanding.

## 2015-04-21 NOTE — Telephone Encounter (Signed)
Pt scheduled for lab/MD today but we cannot find any orders from [facility].   I s/w Urban Gibson,  BMT coordinator and requested orders.  Orders received for labs 9/12, 9/15 and 9/19.  Pt has f/u visit at [facility] on 9/21 w/ Dr. Cassell Clement.  Pt canceled her appts today due to diarrhea.   I called pt and she states has been in bathroom "all morning" and doesn't want to leave house today.  She has taken 2 imodium so far and I instructed her to follow directions,  Taking one after each loose stool to max 8 per day.   I tried to convince her to keep appt this afternoon so MD can assess and also may need to get some IVFs.   Pt says she will try to come in tomorrow.   POF sent to r/s lab/MD tomorrow.  I notified Starling Manns, RN at [facility] of pt's c/o diarrhea and not able to keep appt today.  Will try again tomorrow.

## 2015-04-21 NOTE — Telephone Encounter (Signed)
Patient called in to cancel todays appointment due to diarrhea,i have notified the nurse as well   anne

## 2015-04-21 NOTE — Telephone Encounter (Signed)
s.w. pt husband and confirmed appt....pt ok and aware

## 2015-04-22 ENCOUNTER — Ambulatory Visit (HOSPITAL_BASED_OUTPATIENT_CLINIC_OR_DEPARTMENT_OTHER): Payer: Medicare Other | Admitting: Hematology and Oncology

## 2015-04-22 ENCOUNTER — Ambulatory Visit (HOSPITAL_BASED_OUTPATIENT_CLINIC_OR_DEPARTMENT_OTHER): Payer: Medicare Other

## 2015-04-22 ENCOUNTER — Encounter: Payer: Self-pay | Admitting: Hematology and Oncology

## 2015-04-22 ENCOUNTER — Encounter: Payer: Self-pay | Admitting: *Deleted

## 2015-04-22 ENCOUNTER — Other Ambulatory Visit (HOSPITAL_BASED_OUTPATIENT_CLINIC_OR_DEPARTMENT_OTHER): Payer: Medicare Other

## 2015-04-22 ENCOUNTER — Other Ambulatory Visit: Payer: Self-pay | Admitting: Hematology and Oncology

## 2015-04-22 ENCOUNTER — Telehealth: Payer: Self-pay | Admitting: Hematology and Oncology

## 2015-04-22 VITALS — BP 116/51 | HR 86

## 2015-04-22 VITALS — BP 83/51 | HR 113 | Temp 98.4°F | Resp 16 | Ht 61.0 in | Wt 117.5 lb

## 2015-04-22 DIAGNOSIS — D630 Anemia in neoplastic disease: Secondary | ICD-10-CM

## 2015-04-22 DIAGNOSIS — D72819 Decreased white blood cell count, unspecified: Secondary | ICD-10-CM

## 2015-04-22 DIAGNOSIS — E46 Unspecified protein-calorie malnutrition: Secondary | ICD-10-CM | POA: Diagnosis not present

## 2015-04-22 DIAGNOSIS — I952 Hypotension due to drugs: Secondary | ICD-10-CM | POA: Insufficient documentation

## 2015-04-22 DIAGNOSIS — R112 Nausea with vomiting, unspecified: Secondary | ICD-10-CM

## 2015-04-22 DIAGNOSIS — E860 Dehydration: Secondary | ICD-10-CM | POA: Diagnosis not present

## 2015-04-22 DIAGNOSIS — T451X5A Adverse effect of antineoplastic and immunosuppressive drugs, initial encounter: Secondary | ICD-10-CM | POA: Insufficient documentation

## 2015-04-22 DIAGNOSIS — C9000 Multiple myeloma not having achieved remission: Secondary | ICD-10-CM

## 2015-04-22 DIAGNOSIS — D701 Agranulocytosis secondary to cancer chemotherapy: Secondary | ICD-10-CM

## 2015-04-22 DIAGNOSIS — D6481 Anemia due to antineoplastic chemotherapy: Secondary | ICD-10-CM | POA: Insufficient documentation

## 2015-04-22 DIAGNOSIS — Z9481 Bone marrow transplant status: Secondary | ICD-10-CM | POA: Insufficient documentation

## 2015-04-22 LAB — CBC WITH DIFFERENTIAL/PLATELET
BASO%: 0.9 % (ref 0.0–2.0)
Basophils Absolute: 0 10*3/uL (ref 0.0–0.1)
EOS%: 0.1 % (ref 0.0–7.0)
Eosinophils Absolute: 0 10*3/uL (ref 0.0–0.5)
HCT: 32.4 % — ABNORMAL LOW (ref 34.8–46.6)
HGB: 10.6 g/dL — ABNORMAL LOW (ref 11.6–15.9)
LYMPH%: 24.2 % (ref 14.0–49.7)
MCH: 27.1 pg (ref 25.1–34.0)
MCHC: 32.6 g/dL (ref 31.5–36.0)
MCV: 83.1 fL (ref 79.5–101.0)
MONO#: 0.7 10*3/uL (ref 0.1–0.9)
MONO%: 18 % — ABNORMAL HIGH (ref 0.0–14.0)
NEUT#: 2.1 10*3/uL (ref 1.5–6.5)
NEUT%: 56.8 % (ref 38.4–76.8)
Platelets: 159 10*3/uL (ref 145–400)
RBC: 3.9 10*6/uL (ref 3.70–5.45)
RDW: 18.9 % — ABNORMAL HIGH (ref 11.2–14.5)
WBC: 3.7 10*3/uL — ABNORMAL LOW (ref 3.9–10.3)
lymph#: 0.9 10*3/uL (ref 0.9–3.3)

## 2015-04-22 LAB — COMPREHENSIVE METABOLIC PANEL (CC13)
ALT: 11 U/L (ref 0–55)
AST: 16 U/L (ref 5–34)
Albumin: 3.3 g/dL — ABNORMAL LOW (ref 3.5–5.0)
Alkaline Phosphatase: 89 U/L (ref 40–150)
Anion Gap: 10 mEq/L (ref 3–11)
BUN: 4.3 mg/dL — ABNORMAL LOW (ref 7.0–26.0)
CO2: 23 mEq/L (ref 22–29)
Calcium: 9.3 mg/dL (ref 8.4–10.4)
Chloride: 108 mEq/L (ref 98–109)
Creatinine: 0.8 mg/dL (ref 0.6–1.1)
EGFR: 82 mL/min/{1.73_m2} — ABNORMAL LOW (ref >90)
Glucose: 105 mg/dl (ref 70–140)
Potassium: 3.5 mEq/L (ref 3.5–5.1)
Sodium: 141 mEq/L (ref 136–145)
Total Bilirubin: 0.37 mg/dL (ref 0.20–1.20)
Total Protein: 5.9 g/dL — ABNORMAL LOW (ref 6.4–8.3)

## 2015-04-22 LAB — HOLD TUBE, BLOOD BANK

## 2015-04-22 LAB — TECHNOLOGIST REVIEW

## 2015-04-22 MED ORDER — SODIUM CHLORIDE 0.9 % IV SOLN
Freq: Once | INTRAVENOUS | Status: AC
  Administered 2015-04-22: 12:00:00 via INTRAVENOUS
  Filled 2015-04-22: qty 4

## 2015-04-22 MED ORDER — SODIUM CHLORIDE 0.9 % IV SOLN
Freq: Once | INTRAVENOUS | Status: AC
  Administered 2015-04-22: 12:00:00 via INTRAVENOUS

## 2015-04-22 NOTE — Assessment & Plan Note (Signed)
She has lost some weight since I saw her. Her oral intake has improved since the diarrhea has resolved. Recommend she continues to increase oral intake as tolerated.

## 2015-04-22 NOTE — Assessment & Plan Note (Signed)
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 She will continue antimicrobials prophylaxis as directed by [facility].

## 2015-04-22 NOTE — Assessment & Plan Note (Signed)
She has clinical hypotension and tachycardia likely due to volume depletion. Her husband also give her blood pressure medication recently. I told her to discontinue her blood pressure medication until she returns to [facility] next week. In the meantime, I will give her IV fluid replacement therapy

## 2015-04-22 NOTE — Assessment & Plan Note (Signed)
This is likely anemia of chronic disease. The patient denies recent history of bleeding such as epistaxis, hematuria or hematochezia. She is asymptomatic from the anemia. We will observe for now.  She does not require transfusion now.   

## 2015-04-22 NOTE — Telephone Encounter (Signed)
Gave adn printed appt sched and avs for pt for Sept °

## 2015-04-22 NOTE — Assessment & Plan Note (Signed)
She had recent nausea and diarrhea related to side effects of treatment. She feels weak. Her blood pressure is low and she is tachycardic. Clinically, she appears dehydrated. I will proceed to give her IV fluid support and IV anti-emetics

## 2015-04-22 NOTE — Assessment & Plan Note (Signed)
She is doing well after transplant except for recent diarrhea and nausea. She is clinically hypotensive today which is suspect is due to volume depletion. Clinically, she does not appear to have signs of infection. Per recommendation from [facility], we will bring her back this week and next week for blood draw. I will wait until she returned from her post transplant visit and discussion whether maintenance Revlimid would be a good option for her in the future. I have not made return appointment for her to come back but the patient will call me once she is ready.

## 2015-04-22 NOTE — Assessment & Plan Note (Signed)
This is likely due to recent treatment. The patient denies recent history of fevers, cough, chills, diarrhea or dysuria. She is asymptomatic from the leukopenia. I will observe for now.    

## 2015-04-22 NOTE — Patient Instructions (Signed)
Dehydration, Adult Dehydration is when you lose more fluids from the body than you take in. Vital organs like the kidneys, brain, and heart cannot function without a proper amount of fluids and salt. Any loss of fluids from the body can cause dehydration.  CAUSES   Vomiting.  Diarrhea.  Excessive sweating.  Excessive urine output.  Fever. SYMPTOMS  Mild dehydration  Thirst.  Dry lips.  Slightly dry mouth. Moderate dehydration  Very dry mouth.  Sunken eyes.  Skin does not bounce back quickly when lightly pinched and released.  Dark urine and decreased urine production.  Decreased tear production.  Headache. Severe dehydration  Very dry mouth.  Extreme thirst.  Rapid, weak pulse (more than 100 beats per minute at rest).  Cold hands and feet.  Not able to sweat in spite of heat and temperature.  Rapid breathing.  Blue lips.  Confusion and lethargy.  Difficulty being awakened.  Minimal urine production.  No tears. DIAGNOSIS  Your caregiver will diagnose dehydration based on your symptoms and your exam. Blood and urine tests will help confirm the diagnosis. The diagnostic evaluation should also identify the cause of dehydration. TREATMENT  Treatment of mild or moderate dehydration can often be done at home by increasing the amount of fluids that you drink. It is best to drink small amounts of fluid more often. Drinking too much at one time can make vomiting worse. Refer to the home care instructions below. Severe dehydration needs to be treated at the hospital where you will probably be given intravenous (IV) fluids that contain water and electrolytes. HOME CARE INSTRUCTIONS   Ask your caregiver about specific rehydration instructions.  Drink enough fluids to keep your urine clear or pale yellow.  Drink small amounts frequently if you have nausea and vomiting.  Eat as you normally do.  Avoid:  Foods or drinks high in sugar.  Carbonated  drinks.  Juice.  Extremely hot or cold fluids.  Drinks with caffeine.  Fatty, greasy foods.  Alcohol.  Tobacco.  Overeating.  Gelatin desserts.  Wash your hands well to avoid spreading bacteria and viruses.  Only take over-the-counter or prescription medicines for pain, discomfort, or fever as directed by your caregiver.  Ask your caregiver if you should continue all prescribed and over-the-counter medicines.  Keep all follow-up appointments with your caregiver. SEEK MEDICAL CARE IF:  You have abdominal pain and it increases or stays in one area (localizes).  You have a rash, stiff neck, or severe headache.  You are irritable, sleepy, or difficult to awaken.  You are weak, dizzy, or extremely thirsty. SEEK IMMEDIATE MEDICAL CARE IF:   You are unable to keep fluids down or you get worse despite treatment.  You have frequent episodes of vomiting or diarrhea.  You have blood or green matter (bile) in your vomit.  You have blood in your stool or your stool looks black and tarry.  You have not urinated in 6 to 8 hours, or you have only urinated a small amount of very dark urine.  You have a fever.  You faint. MAKE SURE YOU:   Understand these instructions.  Will watch your condition.  Will get help right away if you are not doing well or get worse. Document Released: 07/26/2005 Document Revised: 10/18/2011 Document Reviewed: 03/15/2011 Exit[employer] Patient Information 2015 [employer]. This information is not intended to replace advice given to you by your health care provider. Make sure you discuss any questions you have with your health care   provider.  

## 2015-04-22 NOTE — Progress Notes (Signed)
[facility] OFFICE PROGRESS NOTE  Patient Care Team: Josetta Huddle, MD as PCP - General (Internal Medicine) Ginette Pitman, MD as Consulting Physician (Hematology and Oncology) Hessie Dibble, MD as Consulting Physician (Hematology and Oncology)  SUMMARY OF ONCOLOGIC HISTORY: Oncology History    M-protein 0.69 gm/dl IFIX - IgG, Kappa IgG - 868 IgA - 19 IgM - < 20 Kappa - 21 Lambda - 5.7  09/06/2014 - Bone marrow aspirate and biopsy:   Normocellular marrow for age (40%) with a small monoclonal plasma cell population (1% on aspirate). Karyotype 23, XX  FISH Negative for myeloma associated changes  09/12/2014 - PET/CT  Two regions that are concerning for disease, one adjacent/involving the left ninth rib and one in the marrow of the right femur, in this patient with history of plasmacytoma.      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   04/01/2015 Bone Marrow Transplant She received melphalan chemotherapy on 03/31/2015 followed by autologous stem cell transplant the day  after   04/03/2015 - 04/18/2015 Hospital Admission The patient was admitted to the hospital at [place] for management related to complication from stem cell transplant. She had significant nausea requiring intravenous anti-emetics.    INTERVAL HISTORY: Please see below for problem oriented charting. She returns for further follow-up and supportive care after recent bone marrow transplant. Her transplant course was complicated by severe nausea and vomiting. Most recently, over the weekend, she had severe diarrhea, resolved with Imodium. She still had mild nausea but no vomiting. She feels weak. She denies signs and symptoms of infection such as fever, chills or cough. The patient denies any recent signs or symptoms of bleeding such as spontaneous epistaxis, hematuria or hematochezia.   REVIEW OF SYSTEMS:   Constitutional: Denies fevers, chills or abnormal weight loss Eyes: Denies blurriness of vision Ears, nose, mouth, throat, and face: Denies mucositis or sore throat Respiratory: Denies cough, dyspnea or wheezes Cardiovascular: Denies palpitation, chest discomfort or lower extremity swelling Skin: Denies abnormal skin rashes Lymphatics: Denies new lymphadenopathy or easy bruising Neurological:Denies numbness, tingling  Behavioral/Psych: Mood is stable, no new changes  All other systems were reviewed with the patient and are negative.  I have reviewed the past medical history, past surgical history, social history and family history with the patient and they are unchanged from previous note.  ALLERGIES:  is allergic to codeine and ibuprofen.  MEDICATIONS:  Current Outpatient Prescriptions  Medication Sig Dispense Refill  . aspirin EC 81 MG tablet Take 81 mg by mouth daily with breakfast.    .  omeprazole (PRILOSEC) 20 MG capsule Take 1 capsule (20 mg total) by mouth daily. 30 capsule 6  . ondansetron (ZOFRAN-ODT) 8 MG disintegrating tablet Take 8 mg by mouth every 8 (eight) hours  as needed for nausea or vomiting.   0  . ondansetron (ZOFRAN) 4 MG tablet Take 1 tablet (4 mg total) by mouth every 6 (six) hours. (Patient not taking: Reported on 04/22/2015) 12 tablet 0   No current facility-administered medications for this visit.   Facility-Administered Medications Ordered in Other Visits  Medication Dose Route Frequency Provider Last Rate Last Dose  . ondansetron (ZOFRAN) 8 mg in sodium chloride 0.9 % 50 mL IVPB   Intravenous Once [name], MD        PHYSICAL EXAMINATION: ECOG PERFORMANCE STATUS: 2 - Symptomatic, <50% confined to bed  Filed Vitals:   04/22/15 1042  BP: 83/51  Pulse: 113  Temp: 98.4 F (36.9 C)  Resp: 16   Filed Weights   04/22/15 1042  Weight: 117 lb 8 oz (53.298 kg)    GENERAL:alert, no distress and comfortable. She looks thinner than her prior exam. SKIN: skin color, texture, turgor are normal, no rashes or significant lesions EYES: normal, Conjunctiva are pink and non-injected, sclera clear OROPHARYNX:no exudate, no erythema and lips, buccal mucosa, and tongue normal . Dry mucous membrane is noted NECK: supple, thyroid normal size, non-tender, without nodularity LYMPH:  no palpable lymphadenopathy in the cervical, axillary or inguinal LUNGS: clear to auscultation and percussion with normal breathing effort HEART: Tachycardia with regular rhythm, no murmurs and no lower extremity edema ABDOMEN:abdomen soft, non-tender and normal bowel sounds Musculoskeletal:no cyanosis of digits and no clubbing  NEURO: alert & oriented x 3 with fluent speech, no focal motor/sensory deficits  LABORATORY DATA:  I have reviewed the data as listed    Component Value Date/Time   NA 141 04/22/2015 1012   NA 142 12/15/2014 1729   K 3.5 04/22/2015 1012   K 2.6* 12/15/2014 1729   CL 108 12/15/2014 1729   CO2 23 04/22/2015 1012   CO2 25 12/15/2014 1729   GLUCOSE 105 04/22/2015 1012   GLUCOSE 81 12/15/2014 1729   BUN 4.3* 04/22/2015 1012   BUN 5*  12/15/2014 1729   CREATININE 0.8 04/22/2015 1012   CREATININE 0.75 12/15/2014 1729   CALCIUM 9.3 04/22/2015 1012   CALCIUM 8.1* 12/15/2014 1729   PROT 5.9* 04/22/2015 1012   ALBUMIN 3.3* 04/22/2015 1012   AST 16 04/22/2015 1012   ALT 11 04/22/2015 1012   ALKPHOS 89 04/22/2015 1012   BILITOT 0.37 04/22/2015 1012   GFRNONAA >60 12/15/2014 1729   GFRAA >60 12/15/2014 1729    No results found for: SPEP, UPEP  Lab Results  Component Value Date   WBC 3.7* 04/22/2015   NEUTROABS 2.1 04/22/2015   HGB 10.6* 04/22/2015   HCT 32.4* 04/22/2015   MCV 83.1 04/22/2015   PLT 159 04/22/2015      Chemistry      Component Value Date/Time   NA 141 04/22/2015 1012   NA 142 12/15/2014 1729   K 3.5 04/22/2015 1012   K 2.6* 12/15/2014 1729   CL 108 12/15/2014 1729   CO2 23 04/22/2015 1012   CO2 25 12/15/2014 1729   BUN 4.3* 04/22/2015 1012   BUN 5* 12/15/2014 1729   CREATININE 0.8 04/22/2015 1012   CREATININE 0.75 12/15/2014 1729      Component Value Date/Time   CALCIUM 9.3 04/22/2015 1012   CALCIUM 8.1* 12/15/2014  1729   ALKPHOS 89 04/22/2015 1012   AST 16 04/22/2015 1012   ALT 11 04/22/2015 1012   BILITOT 0.37 04/22/2015 1012     ASSESSMENT & PLAN:  Multiple myeloma She is doing well after transplant except for recent diarrhea and nausea. She is clinically hypotensive today which is suspect is due to volume depletion. Clinically, she does not appear to have signs of infection. Per recommendation from [facility], we will bring her back this week and next week for blood draw. I will wait until she returned from her post transplant visit and discussion whether maintenance Revlimid would be a good option for her in the future. I have not made return appointment for her to come back but the patient will call me once she is ready.  Anemia in neoplastic disease This is likely anemia of chronic disease. The patient denies recent history of bleeding such as epistaxis, hematuria or  hematochezia. She is asymptomatic from the anemia. We will observe for now.  She does not require transfusion now.    Leukopenia due to antineoplastic chemotherapy This is likely due to recent treatment. The patient denies recent history of fevers, cough, chills, diarrhea or dysuria. She is asymptomatic from the leukopenia. I will observe for now.    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 She will continue antimicrobials prophylaxis as directed by [facility].   Protein calorie malnutrition She has lost some weight since I saw her. Her oral intake has improved since the diarrhea has resolved. Recommend she continues to increase oral intake as tolerated.  Nausea with vomiting She had recent nausea and diarrhea related to side effects of treatment. She feels weak. Her blood pressure is low and she is tachycardic. Clinically, she appears dehydrated. I will proceed to give her IV fluid support and IV anti-emetics  Hypotension due to drugs She has clinical hypotension and tachycardia likely due to volume depletion. Her husband also give her blood pressure medication recently. I told her to discontinue her blood pressure medication until she returns to [facility] next week. In the meantime, I will give her IV fluid replacement therapy   Orders Placed This Encounter  Procedures  . CBC with Differential/Platelet    Standing Status: Standing     Number of Occurrences: 3     Standing Expiration Date: 04/21/2016  . Comprehensive metabolic panel    Standing Status: Standing     Number of Occurrences: 3     Standing Expiration Date: 04/21/2016   All questions were answered. The patient knows to call the clinic with any problems, questions or concerns. No barriers to learning was detected. I spent 25 minutes counseling the patient face to face. The total time spent in the appointment was 40 minutes and more than 50% was on  counseling and review of test results     [facility], [place], MD 04/22/2015 12:07 PM

## 2015-04-22 NOTE — Progress Notes (Signed)
Labs faxed to [facility],  Dr. Cassell Clement

## 2015-04-24 ENCOUNTER — Other Ambulatory Visit (HOSPITAL_BASED_OUTPATIENT_CLINIC_OR_DEPARTMENT_OTHER): Payer: Medicare Other

## 2015-04-24 DIAGNOSIS — C9000 Multiple myeloma not having achieved remission: Secondary | ICD-10-CM | POA: Diagnosis not present

## 2015-04-24 LAB — COMPREHENSIVE METABOLIC PANEL (CC13)
ALT: 9 U/L (ref 0–55)
AST: 16 U/L (ref 5–34)
Albumin: 3.1 g/dL — ABNORMAL LOW (ref 3.5–5.0)
Alkaline Phosphatase: 77 U/L (ref 40–150)
Anion Gap: 8 mEq/L (ref 3–11)
BUN: <3 mg/dL — ABNORMAL LOW (ref 7.0–26.0)
CO2: 25 mEq/L (ref 22–29)
Calcium: 9.1 mg/dL (ref 8.4–10.4)
Chloride: 112 mEq/L — ABNORMAL HIGH (ref 98–109)
Creatinine: 0.7 mg/dL (ref 0.6–1.1)
EGFR: >90 mL/min/{1.73_m2} (ref >90)
Glucose: 90 mg/dl (ref 70–140)
Potassium: 3.6 mEq/L (ref 3.5–5.1)
Sodium: 145 mEq/L (ref 136–145)
Total Bilirubin: 0.25 mg/dL (ref 0.20–1.20)
Total Protein: 5.5 g/dL — ABNORMAL LOW (ref 6.4–8.3)

## 2015-04-24 LAB — CBC WITH DIFFERENTIAL/PLATELET
BASO%: 0.8 % (ref 0.0–2.0)
Basophils Absolute: 0 10*3/uL (ref 0.0–0.1)
EOS%: 0.3 % (ref 0.0–7.0)
Eosinophils Absolute: 0 10*3/uL (ref 0.0–0.5)
HCT: 30.7 % — ABNORMAL LOW (ref 34.8–46.6)
HGB: 9.8 g/dL — ABNORMAL LOW (ref 11.6–15.9)
LYMPH%: 25.2 % (ref 14.0–49.7)
MCH: 27.2 pg (ref 25.1–34.0)
MCHC: 31.9 g/dL (ref 31.5–36.0)
MCV: 85.3 fL (ref 79.5–101.0)
MONO#: 0.8 10*3/uL (ref 0.1–0.9)
MONO%: 20.4 % — ABNORMAL HIGH (ref 0.0–14.0)
NEUT#: 2 10*3/uL (ref 1.5–6.5)
NEUT%: 53.3 % (ref 38.4–76.8)
Platelets: 163 10*3/uL (ref 145–400)
RBC: 3.6 10*6/uL — ABNORMAL LOW (ref 3.70–5.45)
RDW: 19.3 % — ABNORMAL HIGH (ref 11.2–14.5)
WBC: 3.7 10*3/uL — ABNORMAL LOW (ref 3.9–10.3)
lymph#: 0.9 10*3/uL (ref 0.9–3.3)

## 2015-04-28 ENCOUNTER — Telehealth: Payer: Self-pay | Admitting: *Deleted

## 2015-04-28 ENCOUNTER — Other Ambulatory Visit (HOSPITAL_BASED_OUTPATIENT_CLINIC_OR_DEPARTMENT_OTHER): Payer: Medicare Other

## 2015-04-28 DIAGNOSIS — C9000 Multiple myeloma not having achieved remission: Secondary | ICD-10-CM

## 2015-04-28 LAB — COMPREHENSIVE METABOLIC PANEL (CC13)
ALT: 12 U/L (ref 0–55)
AST: 19 U/L (ref 5–34)
Albumin: 3.1 g/dL — ABNORMAL LOW (ref 3.5–5.0)
Alkaline Phosphatase: 77 U/L (ref 40–150)
Anion Gap: 8 mEq/L (ref 3–11)
BUN: 3.6 mg/dL — ABNORMAL LOW (ref 7.0–26.0)
CO2: 27 mEq/L (ref 22–29)
Calcium: 9 mg/dL (ref 8.4–10.4)
Chloride: 110 mEq/L — ABNORMAL HIGH (ref 98–109)
Creatinine: 0.7 mg/dL (ref 0.6–1.1)
EGFR: >90 mL/min/{1.73_m2} (ref >90)
Glucose: 102 mg/dl (ref 70–140)
Potassium: 3.7 mEq/L (ref 3.5–5.1)
Sodium: 145 mEq/L (ref 136–145)
Total Bilirubin: 0.3 mg/dL (ref 0.20–1.20)
Total Protein: 5.6 g/dL — ABNORMAL LOW (ref 6.4–8.3)

## 2015-04-28 LAB — CBC WITH DIFFERENTIAL/PLATELET
BASO%: 0.7 % (ref 0.0–2.0)
Basophils Absolute: 0 10*3/uL (ref 0.0–0.1)
EOS%: 3.4 % (ref 0.0–7.0)
Eosinophils Absolute: 0.2 10*3/uL (ref 0.0–0.5)
HCT: 32.2 % — ABNORMAL LOW (ref 34.8–46.6)
HGB: 10.4 g/dL — ABNORMAL LOW (ref 11.6–15.9)
LYMPH%: 21.7 % (ref 14.0–49.7)
MCH: 27.8 pg (ref 25.1–34.0)
MCHC: 32.3 g/dL (ref 31.5–36.0)
MCV: 86.1 fL (ref 79.5–101.0)
MONO#: 1 10*3/uL — ABNORMAL HIGH (ref 0.1–0.9)
MONO%: 17 % — ABNORMAL HIGH (ref 0.0–14.0)
NEUT#: 3.2 10*3/uL (ref 1.5–6.5)
NEUT%: 57.2 % (ref 38.4–76.8)
Platelets: 225 10*3/uL (ref 145–400)
RBC: 3.74 10*6/uL (ref 3.70–5.45)
RDW: 19.2 % — ABNORMAL HIGH (ref 11.2–14.5)
WBC: 5.7 10*3/uL (ref 3.9–10.3)
lymph#: 1.2 10*3/uL (ref 0.9–3.3)
nRBC: 0 % (ref 0–0)

## 2015-04-28 NOTE — Telephone Encounter (Signed)
Labs faxed to [facility]

## 2015-04-28 NOTE — Telephone Encounter (Signed)
-----   Message from [name], MD sent at 04/28/2015 11:13 AM EDT ----- Regarding: pls fax results to [facility]   ----- Message -----    From: Lab in [facility] Interface    Sent: 04/28/2015  10:46 AM      To: [name], MD

## 2015-04-29 ENCOUNTER — Ambulatory Visit: Payer: Medicare Other | Admitting: Hematology and Oncology

## 2015-04-29 ENCOUNTER — Other Ambulatory Visit: Payer: Medicare Other

## 2015-05-02 ENCOUNTER — Ambulatory Visit: Payer: 59 | Admitting: Obstetrics and Gynecology

## 2015-06-27 ENCOUNTER — Other Ambulatory Visit: Payer: Self-pay | Admitting: Hematology and Oncology

## 2015-07-09 ENCOUNTER — Telehealth: Payer: Self-pay | Admitting: Hematology and Oncology

## 2015-07-09 NOTE — Telephone Encounter (Signed)
returned call and sched appt.....pt ok and aware °

## 2015-07-17 ENCOUNTER — Telehealth: Payer: Self-pay | Admitting: Hematology and Oncology

## 2015-07-17 ENCOUNTER — Ambulatory Visit (HOSPITAL_BASED_OUTPATIENT_CLINIC_OR_DEPARTMENT_OTHER): Payer: Medicare Other | Admitting: Hematology and Oncology

## 2015-07-17 ENCOUNTER — Other Ambulatory Visit: Payer: Self-pay | Admitting: *Deleted

## 2015-07-17 VITALS — BP 149/76 | HR 77 | Temp 98.1°F | Resp 18 | Ht 61.0 in | Wt 120.2 lb

## 2015-07-17 DIAGNOSIS — G620 Drug-induced polyneuropathy: Secondary | ICD-10-CM | POA: Diagnosis not present

## 2015-07-17 DIAGNOSIS — C9001 Multiple myeloma in remission: Secondary | ICD-10-CM

## 2015-07-17 DIAGNOSIS — Z9481 Bone marrow transplant status: Secondary | ICD-10-CM | POA: Diagnosis not present

## 2015-07-17 DIAGNOSIS — D630 Anemia in neoplastic disease: Secondary | ICD-10-CM | POA: Diagnosis not present

## 2015-07-17 DIAGNOSIS — T451X5A Adverse effect of antineoplastic and immunosuppressive drugs, initial encounter: Secondary | ICD-10-CM

## 2015-07-17 MED ORDER — LENALIDOMIDE 10 MG PO CAPS: 10.0000 mg | ORAL_CAPSULE | Freq: Every day | ORAL | Status: DC

## 2015-07-17 NOTE — Telephone Encounter (Signed)
Faxed New Rx Revlimid along w/ copy of pts PANF card to [employer].   Dr. Alvy Bimler is starting pt on Maintenance dose Revlimid.    Pt on on Revlimid prior to Transplant and still enrolled in [facility].  Auth [id number].

## 2015-07-17 NOTE — Telephone Encounter (Signed)
GAVE AND PRINTD APPT SCHED AND AVS FOR PT FOR dec ADN JAN

## 2015-07-18 ENCOUNTER — Encounter: Payer: Self-pay | Admitting: Hematology and Oncology

## 2015-07-18 NOTE — Progress Notes (Signed)
[place] OFFICE PROGRESS NOTE  Patient Care Team: Josetta Huddle, MD as PCP - General (Internal Medicine) Ginette Pitman, MD as Consulting Physician (Hematology and Oncology) Hessie Dibble, MD as Consulting Physician (Hematology and Oncology)  SUMMARY OF ONCOLOGIC HISTORY: Oncology History    M-protein 0.69 gm/dl IFIX - IgG, Kappa IgG - 868 IgA - 19 IgM - < 20 Kappa - 21 Lambda - 5.7  09/06/2014 - Bone marrow aspirate and biopsy:   Normocellular marrow for age (40%) with a small monoclonal plasma cell population (1% on aspirate). Karyotype 87, XX  FISH Negative for myeloma associated changes  09/12/2014 - PET/CT  Two regions that are concerning for disease, one adjacent/involving the left ninth rib and one in the marrow of the right femur, in this patient with history of plasmacytoma.      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   04/01/2015 Bone Marrow Transplant She received melphalan chemotherapy on 03/31/2015 followed by autologous stem cell  transplant the day after   04/03/2015 - 04/18/2015 Hospital Admission The patient was admitted to the hospital at [facility] for management related to complication from stem cell transplant. She had significant nausea requiring intravenous anti-emetics.    INTERVAL HISTORY: Please see below for problem oriented charting. She returns for further follow-up. She has reached day 100 post transplant on 07/10/2015. She have recovered from most of the side effects of treatment. She have intermittent nausea and is afraid to stop Zofran. She denies constipation. She continues to have mild weakness and neuropathy, unchanged. Denies recent infection.  REVIEW OF SYSTEMS:   Constitutional: Denies fevers, chills or abnormal weight loss Eyes: Denies blurriness of vision Ears, nose, mouth, throat, and face: Denies mucositis or sore throat Respiratory: Denies cough, dyspnea or wheezes Cardiovascular: Denies palpitation, chest discomfort or lower extremity swelling Skin: Denies abnormal skin rashes Lymphatics: Denies new lymphadenopathy or easy bruising Neurological:Denies numbness, tingling or new weaknesses Behavioral/Psych: Mood is stable, no new changes  All other systems were reviewed with the patient and are negative.  I have reviewed the past medical history, past surgical history, social history and family history with the patient and they are unchanged from previous note.  ALLERGIES:  is allergic to codeine and ibuprofen.  MEDICATIONS:  Current Outpatient Prescriptions  Medication Sig Dispense Refill  . acyclovir (ZOVIRAX) 800 MG tablet Take 800 mg by mouth 2 (two) times daily.  1  . aspirin EC 81 MG tablet Take 81 mg by mouth daily with breakfast.    .  folic acid (FOLVITE) 1 MG tablet Take 1 mg by mouth daily.  0  . gabapentin (NEURONTIN) 300 MG capsule Take 300 mg by mouth at bedtime.  0  . Multiple Vitamin (MULTIVITAMIN) tablet Take 1 tablet by mouth daily.    Marland Kitchen omeprazole (PRILOSEC)  20 MG capsule Take 1 capsule (20 mg total) by mouth daily. 30 capsule 6  . ondansetron (ZOFRAN) 4 MG tablet Take 1 tablet (4 mg total) by mouth every 6 (six) hours. 12 tablet 0  . ondansetron (ZOFRAN-ODT) 8 MG disintegrating tablet Take 8 mg by mouth every 8 (eight) hours as needed for nausea or vomiting.   0  . sulfamethoxazole-trimethoprim (BACTRIM DS,SEPTRA DS) 800-160 MG tablet Take 1 tablet by mouth 3 (three) times a week.  0  . lenalidomide (REVLIMID) 10 MG capsule Take 1 capsule (10 mg total) by mouth daily. 28 capsule 11   No current facility-administered medications for this visit.    PHYSICAL EXAMINATION: ECOG PERFORMANCE STATUS: 1 - Symptomatic but completely ambulatory  Filed Vitals:   07/17/15 1436 07/17/15 1444  BP: 132/104 149/76  Pulse: 77 77  Temp: 98.1 F (36.7 C)   Resp: 18    Filed Weights   07/17/15 1436  Weight: 120 lb 3.2 oz (54.522 kg)    GENERAL:alert, no distress and comfortable. She is mildly cushingoid SKIN: skin color, texture, turgor are normal, no rashes or significant lesions EYES: normal, Conjunctiva are pink and non-injected, sclera clear OROPHARYNX:no exudate, no erythema and lips, buccal mucosa, and tongue normal  NECK: supple, thyroid normal size, non-tender, without nodularity LYMPH:  no palpable lymphadenopathy in the cervical, axillary or inguinal LUNGS: clear to auscultation and percussion with normal breathing effort HEART: regular rate & rhythm and no murmurs and no lower extremity edema ABDOMEN:abdomen soft, non-tender and normal bowel sounds Musculoskeletal:no cyanosis of digits and no clubbing  NEURO: alert & oriented x 3 with fluent speech, no focal motor/sensory deficits  LABORATORY DATA:  I have reviewed the data as listed    Component Value Date/Time   NA 145 04/28/2015 1027   NA 142 12/15/2014 1729   K 3.7 04/28/2015 1027   K 2.6* 12/15/2014 1729   CL 108 12/15/2014 1729   CO2 27 04/28/2015 1027   CO2 25 12/15/2014 1729    GLUCOSE 102 04/28/2015 1027   GLUCOSE 81 12/15/2014 1729   BUN 3.6* 04/28/2015 1027   BUN 5* 12/15/2014 1729   CREATININE 0.7 04/28/2015 1027   CREATININE 0.75 12/15/2014 1729   CALCIUM 9.0 04/28/2015 1027   CALCIUM 8.1* 12/15/2014 1729   PROT 5.6* 04/28/2015 1027   ALBUMIN 3.1* 04/28/2015 1027   AST 19 04/28/2015 1027   ALT 12 04/28/2015 1027   ALKPHOS 77 04/28/2015 1027   BILITOT 0.30 04/28/2015 1027   GFRNONAA >60 12/15/2014 1729   GFRAA >60 12/15/2014 1729    No results found for: SPEP, UPEP  Lab Results  Component Value Date   WBC 5.7 04/28/2015   NEUTROABS 3.2 04/28/2015   HGB 10.4* 04/28/2015   HCT 32.2* 04/28/2015   MCV 86.1 04/28/2015   PLT 225 04/28/2015      Chemistry      Component Value Date/Time   NA 145 04/28/2015 1027   NA 142 12/15/2014 1729   K 3.7 04/28/2015 1027   K 2.6* 12/15/2014 1729   CL 108 12/15/2014 1729   CO2 27 04/28/2015 1027   CO2 25 12/15/2014 1729   BUN 3.6* 04/28/2015 1027  BUN 5* 12/15/2014 1729   CREATININE 0.7 04/28/2015 1027   CREATININE 0.75 12/15/2014 1729      Component Value Date/Time   CALCIUM 9.0 04/28/2015 1027   CALCIUM 8.1* 12/15/2014 1729   ALKPHOS 77 04/28/2015 1027   AST 19 04/28/2015 1027   ALT 12 04/28/2015 1027   BILITOT 0.30 04/28/2015 1027      ASSESSMENT & PLAN:  Multiple myeloma in remission ([place]) I have reviewed the guidelines with the patient and her husband. Per recommendation from [facility], she will begin monthly Zometa for 2 years. She has appointment to see her dentist next week and we will start treatment on 07/25/2015. She has reached day 100 post transplant on 07/10/2015. Recommendation, we will start her on Revlimid 10 mg daily continuous dose for 1-3 months. If she tolerate that well, recommendation would be to increase it to 15 mg daily in the future. The risks, benefit, side effects of Revlimid is discussed with her and her husband and she agreed to proceed. She will take  aspirin for DVT prophylaxis. I plan to see her next month for further review of toxicity. In the meantime, she will continue calcium with vitamin D supplement.  S/P bone marrow transplant [facility]) 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 She will continue antimicrobials prophylaxis as directed by [facility].     Anemia in neoplastic disease This is likely anemia of chronic disease. The patient denies recent history of bleeding such as epistaxis, hematuria or hematochezia. She is asymptomatic from the anemia. We will observe for now.  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 I recommended Neurontin    No orders of the defined types were placed in this encounter.   All questions were answered. The patient knows to call the clinic with any problems, questions or concerns. No barriers to learning was detected. I spent 20 minutes counseling the patient face to face. The total time spent in the appointment was 25 minutes and more than 50% was on counseling and review of test results     [facility], [place], MD 07/18/2015 12:41 PM

## 2015-07-18 NOTE — Assessment & Plan Note (Signed)
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 She will continue antimicrobials prophylaxis as directed by [facility].  

## 2015-07-18 NOTE — Assessment & Plan Note (Signed)
This is likely anemia of chronic disease. The patient denies recent history of bleeding such as epistaxis, hematuria or hematochezia. She is asymptomatic from the anemia. We will observe for now.  

## 2015-07-18 NOTE — Assessment & Plan Note (Signed)
She has very mild grade 1 neuropathy which I think is from Velcade. I recommended Neurontin

## 2015-07-18 NOTE — Assessment & Plan Note (Addendum)
I have reviewed the guidelines with the patient and her husband. Per recommendation from [facility], she will begin monthly Zometa for 2 years. She has appointment to see her dentist next week and we will start treatment on 07/25/2015. She has reached day 100 post transplant on 07/10/2015. Recommendation, we will start her on Revlimid 10 mg daily continuous dose for 1-3 months. If she tolerate that well, recommendation would be to increase it to 15 mg daily in the future. The risks, benefit, side effects of Revlimid is discussed with her and her husband and she agreed to proceed. She will take aspirin for DVT prophylaxis. I plan to see her next month for further review of toxicity. In the meantime, she will continue calcium with vitamin D supplement.

## 2015-07-20 ENCOUNTER — Other Ambulatory Visit: Payer: Self-pay | Admitting: Hematology and Oncology

## 2015-07-24 ENCOUNTER — Telehealth: Payer: Self-pay | Admitting: *Deleted

## 2015-07-24 ENCOUNTER — Other Ambulatory Visit: Payer: Self-pay | Admitting: *Deleted

## 2015-07-24 ENCOUNTER — Other Ambulatory Visit: Payer: Self-pay | Admitting: Hematology and Oncology

## 2015-07-24 NOTE — Telephone Encounter (Signed)
Called pt to make sure she got Revlimid.  She received it earlier this week and plans to start tomorrow 12/16 as directed.  She is scheduled for Zometa tomorrow.  She has dentist appt today.  Asked her to bring in note or letter from Dentist stating ok for Zometa.  If Dentist says not ok yet let us know so we can cancel her appt.  Otherwise she can keep appt as scheduled and bring note w/ her tomorrow.  Pt verbalized understanding.

## 2015-07-25 ENCOUNTER — Other Ambulatory Visit (HOSPITAL_BASED_OUTPATIENT_CLINIC_OR_DEPARTMENT_OTHER): Payer: Medicare Other

## 2015-07-25 ENCOUNTER — Ambulatory Visit (HOSPITAL_BASED_OUTPATIENT_CLINIC_OR_DEPARTMENT_OTHER): Payer: Medicare Other

## 2015-07-25 VITALS — BP 162/84 | HR 73 | Temp 98.2°F | Resp 18

## 2015-07-25 DIAGNOSIS — C9000 Multiple myeloma not having achieved remission: Secondary | ICD-10-CM | POA: Diagnosis not present

## 2015-07-25 DIAGNOSIS — Z9481 Bone marrow transplant status: Secondary | ICD-10-CM

## 2015-07-25 DIAGNOSIS — C9001 Multiple myeloma in remission: Secondary | ICD-10-CM

## 2015-07-25 LAB — CBC WITH DIFFERENTIAL/PLATELET
BASO%: 0.8 % (ref 0.0–2.0)
Basophils Absolute: 0 10*3/uL (ref 0.0–0.1)
EOS%: 2.7 % (ref 0.0–7.0)
Eosinophils Absolute: 0.1 10*3/uL (ref 0.0–0.5)
HCT: 33 % — ABNORMAL LOW (ref 34.8–46.6)
HGB: 10.5 g/dL — ABNORMAL LOW (ref 11.6–15.9)
LYMPH%: 28.6 % (ref 14.0–49.7)
MCH: 27.3 pg (ref 25.1–34.0)
MCHC: 31.9 g/dL (ref 31.5–36.0)
MCV: 85.6 fL (ref 79.5–101.0)
MONO#: 0.5 10*3/uL (ref 0.1–0.9)
MONO%: 9.1 % (ref 0.0–14.0)
NEUT#: 3.1 10*3/uL (ref 1.5–6.5)
NEUT%: 58.8 % (ref 38.4–76.8)
Platelets: 244 10*3/uL (ref 145–400)
RBC: 3.85 10*6/uL (ref 3.70–5.45)
RDW: 13.3 % (ref 11.2–14.5)
WBC: 5.2 10*3/uL (ref 3.9–10.3)
lymph#: 1.5 10*3/uL (ref 0.9–3.3)

## 2015-07-25 LAB — COMPREHENSIVE METABOLIC PANEL
ALT: 12 U/L (ref 0–55)
AST: 18 U/L (ref 5–34)
Albumin: 4.1 g/dL (ref 3.5–5.0)
Alkaline Phosphatase: 70 U/L (ref 40–150)
Anion Gap: 10 mEq/L (ref 3–11)
BUN: 12.6 mg/dL (ref 7.0–26.0)
CO2: 22 mEq/L (ref 22–29)
Calcium: 10.1 mg/dL (ref 8.4–10.4)
Chloride: 111 mEq/L — ABNORMAL HIGH (ref 98–109)
Creatinine: 0.9 mg/dL (ref 0.6–1.1)
EGFR: 69 mL/min/{1.73_m2} — ABNORMAL LOW (ref >90)
Glucose: 88 mg/dl (ref 70–140)
Potassium: 4 mEq/L (ref 3.5–5.1)
Sodium: 143 mEq/L (ref 136–145)
Total Bilirubin: <0.3 mg/dL (ref 0.20–1.20)
Total Protein: 6.8 g/dL (ref 6.4–8.3)

## 2015-07-25 MED ORDER — SODIUM CHLORIDE 0.9 % IV SOLN
Freq: Once | INTRAVENOUS | Status: AC
  Administered 2015-07-25: 14:00:00 via INTRAVENOUS

## 2015-07-25 MED ORDER — ZOLEDRONIC ACID 4 MG/100ML IV SOLN
4.0000 mg | Freq: Once | INTRAVENOUS | Status: AC
  Administered 2015-07-25: 4 mg via INTRAVENOUS
  Filled 2015-07-25: qty 100

## 2015-07-25 NOTE — Patient Instructions (Signed)

## 2015-07-28 ENCOUNTER — Telehealth: Payer: Self-pay | Admitting: *Deleted

## 2015-07-28 NOTE — Telephone Encounter (Signed)
Pt called on-call Nurse on Friday evening 12/16 reported she developed some itching and a few quarter sized red "splotches" on her forehead and chin.  This started about one hour after leaving our clinic after getting Zometa.  Pt took one benadryl,  But splotches did not go away.  She repeated benadryl and went to bed.  She woke up Saturday w/o any red spots.  She continues to take Revlimid daily as directed.  She is scheduled for Zometa again on 1/16 but will see Dr. Alvy Bimler same day before the zometa.   Instructed pt to continue Revlimid as directed and call us if any itching or rash returns.  She verbalized understanding.

## 2015-08-12 ENCOUNTER — Encounter: Payer: Self-pay | Admitting: Hematology and Oncology

## 2015-08-12 ENCOUNTER — Other Ambulatory Visit (HOSPITAL_BASED_OUTPATIENT_CLINIC_OR_DEPARTMENT_OTHER): Payer: Medicare Other

## 2015-08-12 ENCOUNTER — Telehealth: Payer: Self-pay | Admitting: *Deleted

## 2015-08-12 ENCOUNTER — Ambulatory Visit (HOSPITAL_BASED_OUTPATIENT_CLINIC_OR_DEPARTMENT_OTHER): Payer: Medicare Other | Admitting: Hematology and Oncology

## 2015-08-12 VITALS — BP 140/77 | HR 88 | Temp 98.5°F | Resp 18 | Ht 61.0 in | Wt 117.0 lb

## 2015-08-12 DIAGNOSIS — C9001 Multiple myeloma in remission: Secondary | ICD-10-CM

## 2015-08-12 DIAGNOSIS — J069 Acute upper respiratory infection, unspecified: Secondary | ICD-10-CM | POA: Diagnosis not present

## 2015-08-12 HISTORY — DX: Acute upper respiratory infection, unspecified: J06.9

## 2015-08-12 LAB — CBC WITH DIFFERENTIAL/PLATELET
BASO%: 0.8 % (ref 0.0–2.0)
Basophils Absolute: 0 10e3/uL (ref 0.0–0.1)
EOS%: 5.5 % (ref 0.0–7.0)
Eosinophils Absolute: 0.3 10e3/uL (ref 0.0–0.5)
HCT: 31.1 % — ABNORMAL LOW (ref 34.8–46.6)
HGB: 10.1 g/dL — ABNORMAL LOW (ref 11.6–15.9)
LYMPH%: 17.2 % (ref 14.0–49.7)
MCH: 27.1 pg (ref 25.1–34.0)
MCHC: 32.5 g/dL (ref 31.5–36.0)
MCV: 83.6 fL (ref 79.5–101.0)
MONO#: 0.7 10e3/uL (ref 0.1–0.9)
MONO%: 12.4 % (ref 0.0–14.0)
NEUT#: 3.4 10e3/uL (ref 1.5–6.5)
NEUT%: 64.1 % (ref 38.4–76.8)
Platelets: 211 10e3/uL (ref 145–400)
RBC: 3.72 10e6/uL (ref 3.70–5.45)
RDW: 14 % (ref 11.2–14.5)
WBC: 5.3 10e3/uL (ref 3.9–10.3)
lymph#: 0.9 10e3/uL (ref 0.9–3.3)

## 2015-08-12 LAB — COMPREHENSIVE METABOLIC PANEL WITH GFR
ALT: 22 U/L (ref 0–55)
AST: 28 U/L (ref 5–34)
Albumin: 3.7 g/dL (ref 3.5–5.0)
Alkaline Phosphatase: 100 U/L (ref 40–150)
Anion Gap: 7 meq/L (ref 3–11)
BUN: 10.1 mg/dL (ref 7.0–26.0)
CO2: 21 meq/L — ABNORMAL LOW (ref 22–29)
Calcium: 8.7 mg/dL (ref 8.4–10.4)
Chloride: 111 meq/L — ABNORMAL HIGH (ref 98–109)
Creatinine: 1 mg/dL (ref 0.6–1.1)
EGFR: 60 ml/min/1.73 m2 — ABNORMAL LOW
Glucose: 92 mg/dL (ref 70–140)
Potassium: 4.2 meq/L (ref 3.5–5.1)
Sodium: 140 meq/L (ref 136–145)
Total Bilirubin: 0.47 mg/dL (ref 0.20–1.20)
Total Protein: 6.7 g/dL (ref 6.4–8.3)

## 2015-08-12 MED ORDER — HYDROCODONE-HOMATROPINE 5-1.5 MG/5ML PO SYRP: 5.0000 mL | ORAL_SOLUTION | Freq: Four times a day (QID) | ORAL | Status: DC | PRN

## 2015-08-12 MED ORDER — FLUTICASONE PROPIONATE 50 MCG/ACT NA SUSP: 1.0000 | Freq: Two times a day (BID) | NASAL | Status: DC

## 2015-08-12 MED ORDER — AMOXICILLIN-POT CLAVULANATE 875-125 MG PO TABS: 1.0000 | ORAL_TABLET | Freq: Two times a day (BID) | ORAL | Status: DC

## 2015-08-12 NOTE — Progress Notes (Signed)
[place] OFFICE PROGRESS NOTE  Patient Care Team: Josetta Huddle, MD as PCP - General (Internal Medicine) Ginette Pitman, MD as Consulting Physician (Hematology and Oncology) Hessie Dibble, MD as Consulting Physician (Hematology and Oncology)  SUMMARY OF ONCOLOGIC HISTORY: Oncology History    M-protein 0.69 gm/dl IFIX - IgG, Kappa IgG - 868 IgA - 19 IgM - < 20 Kappa - 21 Lambda - 5.7  09/06/2014 - Bone marrow aspirate and biopsy:   Normocellular marrow for age (40%) with a small monoclonal plasma cell population (1% on aspirate). Karyotype 66, XX  FISH Negative for myeloma associated changes  09/12/2014 - PET/CT  Two regions that are concerning for disease, one adjacent/involving the left ninth rib and one in the marrow of the right femur, in this patient with history of plasmacytoma.      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   04/01/2015 Bone Marrow Transplant She received melphalan chemotherapy on 03/31/2015 followed by autologous stem cell  transplant the day after   04/03/2015 - 04/18/2015 Hospital Admission The patient was admitted to the hospital at [facility] for management related to complication from stem cell transplant. She had significant nausea requiring intravenous anti-emetics.    INTERVAL HISTORY: Please see below for problem oriented charting.  she is seen urgently today because she has not been feeling well since 08/07/2016. She has some nonproductive cough, congestion and persistent nasal drainage. She denies recent sick contact.  She denies recent fevers. She has occasional headaches. Denies recent dental pain.  she had recent reaction to Zometa. She has some hives and bone pain for a few days after Zometa.  REVIEW OF SYSTEMS:   Constitutional: Denies fevers, chills or abnormal weight loss Eyes: Denies blurriness of vision Ears, nose, mouth, throat, and face: Denies mucositis or sore throat Cardiovascular: Denies palpitation, chest discomfort or lower extremity swelling Gastrointestinal:  Denies nausea, heartburn or change in bowel habits Skin: Denies abnormal skin rashes Lymphatics: Denies new lymphadenopathy or easy bruising Neurological:Denies numbness, tingling or new weaknesses Behavioral/Psych: Mood is stable, no new changes  All other systems were reviewed with the patient and are negative.  I have reviewed the past medical history, past surgical history, social history and family history with the patient and they are unchanged from previous note.  ALLERGIES:  is allergic to codeine and ibuprofen.  MEDICATIONS:  Current Outpatient Prescriptions  Medication Sig Dispense Refill  . acyclovir (ZOVIRAX) 800 MG tablet Take 800 mg by mouth 2 (two) times daily.  1  .  aspirin EC 81 MG tablet Take 81 mg by mouth daily with breakfast.    . folic acid (FOLVITE) 1 MG tablet Take 1 mg by mouth daily.  0  . gabapentin (NEURONTIN) 300 MG capsule Take 300 mg by mouth at bedtime.  0  . lenalidomide (REVLIMID) 10  MG capsule Take 1 capsule (10 mg total) by mouth daily. 28 capsule 11  . Multiple Vitamin (MULTIVITAMIN) tablet Take 1 tablet by mouth daily.    Marland Kitchen omeprazole (PRILOSEC) 20 MG capsule take 1 capsule by mouth once daily 30 capsule 6  . ondansetron (ZOFRAN-ODT) 8 MG disintegrating tablet Take 8 mg by mouth every 8 (eight) hours as needed for nausea or vomiting.   0  . sulfamethoxazole-trimethoprim (BACTRIM DS,SEPTRA DS) 800-160 MG tablet Take 1 tablet by mouth 3 (three) times a week.  0  . amoxicillin-clavulanate (AUGMENTIN) 875-125 MG tablet Take 1 tablet by mouth 2 (two) times daily. 14 tablet 0  . fluticasone (FLONASE) 50 MCG/ACT nasal spray Place 1 spray into both nostrils 2 (two) times daily. 16 g 2  . HYDROcodone-homatropine (HYCODAN) 5-1.5 MG/5ML syrup Take 5 mLs by mouth every 6 (six) hours as needed for cough. 120 mL 0   No current facility-administered medications for this visit.    PHYSICAL EXAMINATION: ECOG PERFORMANCE STATUS: 1 - Symptomatic but completely ambulatory  Filed Vitals:   08/12/15 1157  BP: 140/77  Pulse: 88  Temp: 98.5 F (36.9 C)  Resp: 18   Filed Weights   08/12/15 1157  Weight: 117 lb (53.071 kg)    GENERAL:alert, no distress and comfortable SKIN: skin color, texture, turgor are normal, no rashes or significant lesions EYES: normal, Conjunctiva are pink and non-injected, sclera clear OROPHARYNX:no exudate, no erythema and lips, buccal mucosa, and tongue normal . No tenderness on palpation of her sinuses. NECK: supple, thyroid normal size, non-tender, without nodularity LYMPH:  no palpable lymphadenopathy in the cervical, axillary or inguinal LUNGS: clear to auscultation and percussion with normal breathing effort HEART: regular rate & rhythm and no murmurs and no lower extremity edema ABDOMEN:abdomen soft, non-tender and normal bowel sounds Musculoskeletal:no cyanosis of digits and no clubbing  NEURO: alert & oriented x 3 with fluent speech, no focal  motor/sensory deficits  LABORATORY DATA:  I have reviewed the data as listed    Component Value Date/Time   NA 140 08/12/2015 1146   NA 142 12/15/2014 1729   K 4.2 08/12/2015 1146   K 2.6* 12/15/2014 1729   CL 108 12/15/2014 1729   CO2 21* 08/12/2015 1146   CO2 25 12/15/2014 1729   GLUCOSE 92 08/12/2015 1146   GLUCOSE 81 12/15/2014 1729   BUN 10.1 08/12/2015 1146   BUN 5* 12/15/2014 1729   CREATININE 1.0 08/12/2015 1146   CREATININE 0.75 12/15/2014 1729   CALCIUM 8.7 08/12/2015 1146   CALCIUM 8.1* 12/15/2014 1729   PROT 6.7 08/12/2015 1146   ALBUMIN 3.7 08/12/2015 1146   AST 28 08/12/2015 1146   ALT 22 08/12/2015 1146   ALKPHOS 100 08/12/2015 1146   BILITOT 0.47 08/12/2015 1146   GFRNONAA >60 12/15/2014 1729   GFRAA >60 12/15/2014 1729    No results found for: SPEP, UPEP  Lab Results  Component Value Date   WBC 5.3 08/12/2015   NEUTROABS 3.4 08/12/2015   HGB 10.1* 08/12/2015   HCT 31.1* 08/12/2015   MCV 83.6 08/12/2015   PLT 211 08/12/2015      Chemistry      Component Value  Date/Time   NA 140 08/12/2015 1146   NA 142 12/15/2014 1729   K 4.2 08/12/2015 1146   K 2.6* 12/15/2014 1729   CL 108 12/15/2014 1729   CO2 21* 08/12/2015 1146   CO2 25 12/15/2014 1729   BUN 10.1 08/12/2015 1146   BUN 5* 12/15/2014 1729   CREATININE 1.0 08/12/2015 1146   CREATININE 0.75 12/15/2014 1729      Component Value Date/Time   CALCIUM 8.7 08/12/2015 1146   CALCIUM 8.1* 12/15/2014 1729   ALKPHOS 100 08/12/2015 1146   AST 28 08/12/2015 1146   ALT 22 08/12/2015 1146   BILITOT 0.47 08/12/2015 1146      ASSESSMENT & PLAN:  Multiple myeloma in remission ([place])  She is currently receiving Zometa. She had mild reaction to Zometa previously with some bone pain and hives. We discussed potential lowering the dose or switching her to pamidronate. She wants to try it one more time at reduced dose Zometa.   Upper respiratory infection, acute  She has acute viral infection  status since 08/08/2015 and is not getting better. She denies recent fever or leukocytosis. I recommend a trial of conservative management with cough syrup, nasal spray and bed rests. If she does not get better or felt worse, I gave her prescription of Augmentin to start then but I recommend she wait for minimum 48 hours before she considers starting herself on antibiotics.   No orders of the defined types were placed in this encounter.   All questions were answered. The patient knows to call the clinic with any problems, questions or concerns. No barriers to learning was detected. I spent 15 minutes counseling the patient face to face. The total time spent in the appointment was 20 minutes and more than 50% was on counseling and review of test results     [facility], [place], MD 08/12/2015 2:14 PM

## 2015-08-12 NOTE — Telephone Encounter (Signed)
Pt left VM x 2 stating cold symptoms, congestion, runny nose and dry cough x 3 days.  Denies any fever.  Called pt back and informed her Dr. Alvy Bimler can see her today at 12:15 pm.  Please come at 11:45 am for lab first.  Pt verbalized understanding.  POF sent.

## 2015-08-12 NOTE — Assessment & Plan Note (Signed)
She has acute viral infection status since 08/08/2015 and is not getting better. She denies recent fever or leukocytosis. I recommend a trial of conservative management with cough syrup, nasal spray and bed rests. If she does not get better or felt worse, I gave her prescription of Augmentin to start then but I recommend she wait for minimum 48 hours before she considers starting herself on antibiotics.

## 2015-08-12 NOTE — Assessment & Plan Note (Signed)
She is currently receiving Zometa. She had mild reaction to Zometa previously with some bone pain and hives. We discussed potential lowering the dose or switching her to pamidronate. She wants to try it one more time at reduced dose Zometa.

## 2015-08-15 ENCOUNTER — Other Ambulatory Visit: Payer: Self-pay | Admitting: *Deleted

## 2015-08-15 MED ORDER — LENALIDOMIDE 10 MG PO CAPS: 10.0000 mg | ORAL_CAPSULE | Freq: Every day | ORAL | Status: DC

## 2015-08-20 ENCOUNTER — Telehealth: Payer: Self-pay | Admitting: Hematology and Oncology

## 2015-08-20 ENCOUNTER — Telehealth: Payer: Self-pay | Admitting: *Deleted

## 2015-08-20 NOTE — Telephone Encounter (Signed)
-----   Message from [name], MD sent at 08/20/2015 10:10 AM EST ----- Regarding: come in early [place] I'm double booked at 130 pm Can you call Miss Lamprecht to come in early so she can have labs and see me at either 1230 or 1 pm. May need to move zometa appt earlier

## 2015-08-20 NOTE — Telephone Encounter (Signed)
Per pof move appointments on 1/16 to a new time per pof patient is aware

## 2015-08-20 NOTE — Telephone Encounter (Signed)
S/w pt and she will come in at 12 noon on Monday 1/16.  POF sent to move lab to 12 noon,  Dr. Alvy Bimler to 12:30 pm and Zometa to 1 pm.

## 2015-08-21 ENCOUNTER — Encounter: Payer: Self-pay | Admitting: Hematology and Oncology

## 2015-08-21 NOTE — Progress Notes (Signed)
Per [employer] revlimid shipped via fedex February 25, 2050

## 2015-08-22 ENCOUNTER — Other Ambulatory Visit: Payer: Self-pay | Admitting: Hematology and Oncology

## 2015-08-22 DIAGNOSIS — C9001 Multiple myeloma in remission: Secondary | ICD-10-CM

## 2015-08-25 ENCOUNTER — Other Ambulatory Visit: Payer: Medicare Other

## 2015-08-25 ENCOUNTER — Ambulatory Visit: Payer: Medicare Other

## 2015-08-25 ENCOUNTER — Telehealth: Payer: Self-pay | Admitting: Hematology and Oncology

## 2015-08-25 ENCOUNTER — Other Ambulatory Visit (HOSPITAL_BASED_OUTPATIENT_CLINIC_OR_DEPARTMENT_OTHER): Payer: Medicare Other

## 2015-08-25 ENCOUNTER — Ambulatory Visit (HOSPITAL_BASED_OUTPATIENT_CLINIC_OR_DEPARTMENT_OTHER): Payer: Medicare Other | Admitting: Hematology and Oncology

## 2015-08-25 ENCOUNTER — Ambulatory Visit: Payer: Medicare Other | Admitting: Hematology and Oncology

## 2015-08-25 ENCOUNTER — Ambulatory Visit (HOSPITAL_BASED_OUTPATIENT_CLINIC_OR_DEPARTMENT_OTHER): Payer: Medicare Other

## 2015-08-25 ENCOUNTER — Encounter: Payer: Self-pay | Admitting: Hematology and Oncology

## 2015-08-25 VITALS — BP 134/98 | HR 76 | Temp 98.6°F | Resp 18 | Ht 61.0 in | Wt 117.5 lb

## 2015-08-25 DIAGNOSIS — C9001 Multiple myeloma in remission: Secondary | ICD-10-CM

## 2015-08-25 DIAGNOSIS — D6481 Anemia due to antineoplastic chemotherapy: Secondary | ICD-10-CM

## 2015-08-25 DIAGNOSIS — Z9481 Bone marrow transplant status: Secondary | ICD-10-CM

## 2015-08-25 DIAGNOSIS — J069 Acute upper respiratory infection, unspecified: Secondary | ICD-10-CM | POA: Diagnosis not present

## 2015-08-25 DIAGNOSIS — I8390 Asymptomatic varicose veins of unspecified lower extremity: Secondary | ICD-10-CM | POA: Insufficient documentation

## 2015-08-25 DIAGNOSIS — T451X5A Adverse effect of antineoplastic and immunosuppressive drugs, initial encounter: Secondary | ICD-10-CM

## 2015-08-25 LAB — COMPREHENSIVE METABOLIC PANEL
ALT: 11 U/L (ref 0–55)
AST: 16 U/L (ref 5–34)
Albumin: 3.9 g/dL (ref 3.5–5.0)
Alkaline Phosphatase: 67 U/L (ref 40–150)
Anion Gap: 10 mEq/L (ref 3–11)
BUN: 12.7 mg/dL (ref 7.0–26.0)
CO2: 21 mEq/L — ABNORMAL LOW (ref 22–29)
Calcium: 8.6 mg/dL (ref 8.4–10.4)
Chloride: 112 mEq/L — ABNORMAL HIGH (ref 98–109)
Creatinine: 0.9 mg/dL (ref 0.6–1.1)
EGFR: 67 mL/min/{1.73_m2} — ABNORMAL LOW (ref >90)
Glucose: 91 mg/dl (ref 70–140)
Potassium: 3.8 mEq/L (ref 3.5–5.1)
Sodium: 142 mEq/L (ref 136–145)
Total Bilirubin: 0.33 mg/dL (ref 0.20–1.20)
Total Protein: 6.5 g/dL (ref 6.4–8.3)

## 2015-08-25 LAB — CBC WITH DIFFERENTIAL/PLATELET
BASO%: 2 % (ref 0.0–2.0)
Basophils Absolute: 0.1 10*3/uL (ref 0.0–0.1)
EOS%: 6.3 % (ref 0.0–7.0)
Eosinophils Absolute: 0.4 10*3/uL (ref 0.0–0.5)
HCT: 31.6 % — ABNORMAL LOW (ref 34.8–46.6)
HGB: 10.3 g/dL — ABNORMAL LOW (ref 11.6–15.9)
LYMPH%: 18.1 % (ref 14.0–49.7)
MCH: 26.9 pg (ref 25.1–34.0)
MCHC: 32.6 g/dL (ref 31.5–36.0)
MCV: 82.6 fL (ref 79.5–101.0)
MONO#: 0.5 10*3/uL (ref 0.1–0.9)
MONO%: 7.8 % (ref 0.0–14.0)
NEUT#: 4.3 10*3/uL (ref 1.5–6.5)
NEUT%: 65.8 % (ref 38.4–76.8)
Platelets: 220 10*3/uL (ref 145–400)
RBC: 3.83 10*6/uL (ref 3.70–5.45)
RDW: 14.7 % — ABNORMAL HIGH (ref 11.2–14.5)
WBC: 6.6 10*3/uL (ref 3.9–10.3)
lymph#: 1.2 10*3/uL (ref 0.9–3.3)

## 2015-08-25 MED ORDER — HYDROCODONE-HOMATROPINE 5-1.5 MG/5ML PO SYRP: 5.0000 mL | ORAL_SOLUTION | Freq: Four times a day (QID) | ORAL | Status: DC | PRN

## 2015-08-25 MED ORDER — SODIUM CHLORIDE 0.9 % IV SOLN
Freq: Once | INTRAVENOUS | Status: AC
  Administered 2015-08-25: 14:00:00 via INTRAVENOUS

## 2015-08-25 MED ORDER — ZOLEDRONIC ACID 4 MG/5ML IV CONC
3.0000 mg | Freq: Once | INTRAVENOUS | Status: AC
  Administered 2015-08-25: 3 mg via INTRAVENOUS
  Filled 2015-08-25: qty 3.75

## 2015-08-25 NOTE — Progress Notes (Signed)
[place] OFFICE PROGRESS NOTE  Patient Care Team: Josetta Huddle, MD as PCP - General (Internal Medicine) Ginette Pitman, MD as Consulting Physician (Hematology and Oncology) Hessie Dibble, MD as Consulting Physician (Hematology and Oncology)  SUMMARY OF ONCOLOGIC HISTORY: Oncology History    M-protein 0.69 gm/dl IFIX - IgG, Kappa IgG - 868 IgA - 19 IgM - < 20 Kappa - 21 Lambda - 5.7  09/06/2014 - Bone marrow aspirate and biopsy:   Normocellular marrow for age (40%) with a small monoclonal plasma cell population (1% on aspirate). Karyotype 74, XX  FISH Negative for myeloma associated changes  09/12/2014 - PET/CT  Two regions that are concerning for disease, one adjacent/involving the left ninth rib and one in the marrow of the right femur, in this patient with history of plasmacytoma.      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   04/01/2015 Bone Marrow Transplant She received melphalan chemotherapy on 03/31/2015 followed by autologous stem cell  transplant the day after   04/03/2015 - 04/18/2015 Hospital Admission The patient was admitted to the hospital at [place] for management related to complication from stem cell transplant. She had significant nausea requiring intravenous anti-emetics.   07/17/2015 -  Chemotherapy She started maintenance Revlimid and monthly zometa     INTERVAL HISTORY: Please see below for problem oriented charting.  she is seen prior to Zometa. She developed skin rash recently and was worried that it might be side effects of Zometa. The skin rash had resolved. She will start on Augmentin for upper respiratory tract infection and she discontinue after 2 doses because of the skin rashes.  she also have rash on both lower legs. She denies recent sore throat. Her cough has improved. No new bone pain. She is mildly constipated that and is using laxatives as needed  REVIEW OF SYSTEMS:   Constitutional: Denies fevers, chills or abnormal weight loss Eyes: Denies blurriness of vision Ears, nose, mouth, throat, and face: Denies mucositis or sore throat Respiratory: Denies cough, dyspnea or wheezes Cardiovascular: Denies palpitation, chest discomfort or lower extremity swelling Lymphatics: Denies new lymphadenopathy or easy bruising Neurological:Denies numbness, tingling or new weaknesses Behavioral/Psych: Mood is stable, no new changes  All other systems were reviewed with the patient and are negative.  I have reviewed the past medical history, past surgical history, social history and family history with the patient and they are unchanged from previous note.  ALLERGIES:  is allergic to codeine; ibuprofen; and augmentin.  MEDICATIONS:  Current Outpatient Prescriptions  Medication Sig Dispense Refill  . acyclovir (ZOVIRAX) 800 MG tablet Take 800 mg by mouth 2 (two) times daily.  1  . aspirin EC 81 MG tablet Take 81 mg by mouth daily with breakfast.    . fluticasone (FLONASE) 50 MCG/ACT nasal spray Place 1  spray into both nostrils 2 (two) times daily. 16 g 2  . folic acid (FOLVITE) 1 MG tablet Take 1 mg by mouth daily.  0  . gabapentin (NEURONTIN) 300 MG capsule Take 300 mg by mouth at bedtime.  0  . HYDROcodone-homatropine (HYCODAN) 5-1.5 MG/5ML syrup Take 5 mLs by mouth every 6 (six) hours as needed for cough. 180 mL 0  . lenalidomide (REVLIMID) 10 MG capsule Take 1 capsule (10 mg total) by mouth daily. 28 capsule 11  . Multiple Vitamin (MULTIVITAMIN) tablet Take 1 tablet by mouth daily.    Marland Kitchen omeprazole (PRILOSEC) 20 MG capsule take 1 capsule by mouth once daily 30 capsule 6  . ondansetron (ZOFRAN-ODT) 8 MG disintegrating tablet Take 8 mg by mouth every 8 (eight) hours as needed for nausea or vomiting.   0  . senna-docusate (SENOKOT-S) 8.6-50 MG tablet Take 2 tablets by mouth once as needed.    . sulfamethoxazole-trimethoprim (BACTRIM DS,SEPTRA DS) 800-160 MG tablet Take 1 tablet by mouth 3 (three) times a week.  0   No current facility-administered medications for this visit.    PHYSICAL EXAMINATION: ECOG PERFORMANCE STATUS: 1 - Symptomatic but completely ambulatory  Filed Vitals:   08/25/15 1213  BP: 134/98  Pulse: 76  Temp: 98.6 F (37 C)  Resp: 18   Filed Weights   08/25/15 1213  Weight: 117 lb 8 oz (53.298 kg)    GENERAL:alert, no distress and comfortable SKIN: skin color, texture, turgor are normal, no rashes or significant lesions. Both legs have varicose veins. EYES: normal, Conjunctiva are pink and non-injected, sclera clear OROPHARYNX:no exudate, no erythema and lips, buccal mucosa, and tongue normal  NECK: supple, thyroid normal size, non-tender, without nodularity LYMPH:  no palpable lymphadenopathy in the cervical, axillary or inguinal LUNGS: clear to auscultation and percussion with normal breathing effort HEART: regular rate & rhythm and no murmurs and no lower extremity edema ABDOMEN:abdomen soft, non-tender and normal bowel sounds Musculoskeletal:no cyanosis of  digits and no clubbing  NEURO: alert & oriented x 3 with fluent speech, no focal motor/sensory deficits  LABORATORY DATA:  I have reviewed the data as listed    Component Value Date/Time   NA 142 08/25/2015 1200   NA 142 12/15/2014 1729   K 3.8 08/25/2015 1200   K 2.6* 12/15/2014 1729   CL 108 12/15/2014 1729   CO2 21* 08/25/2015 1200   CO2 25 12/15/2014 1729   GLUCOSE 91 08/25/2015 1200   GLUCOSE 81 12/15/2014 1729   BUN 12.7 08/25/2015 1200   BUN 5* 12/15/2014 1729   CREATININE 0.9 08/25/2015 1200   CREATININE 0.75 12/15/2014 1729   CALCIUM 8.6 08/25/2015 1200   CALCIUM 8.1* 12/15/2014 1729   PROT 6.5 08/25/2015 1200   ALBUMIN 3.9 08/25/2015 1200   AST 16 08/25/2015 1200   ALT 11 08/25/2015 1200   ALKPHOS 67 08/25/2015 1200   BILITOT 0.33 08/25/2015 1200   GFRNONAA >60 12/15/2014 1729   GFRAA >60 12/15/2014 1729    No results found for: SPEP, UPEP  Lab Results  Component Value Date   WBC 6.6 08/25/2015   NEUTROABS 4.3 08/25/2015   HGB 10.3* 08/25/2015   HCT 31.6* 08/25/2015  MCV 82.6 08/25/2015   PLT 220 08/25/2015      Chemistry      Component Value Date/Time   NA 142 08/25/2015 1200   NA 142 12/15/2014 1729   K 3.8 08/25/2015 1200   K 2.6* 12/15/2014 1729   CL 108 12/15/2014 1729   CO2 21* 08/25/2015 1200   CO2 25 12/15/2014 1729   BUN 12.7 08/25/2015 1200   BUN 5* 12/15/2014 1729   CREATININE 0.9 08/25/2015 1200   CREATININE 0.75 12/15/2014 1729      Component Value Date/Time   CALCIUM 8.6 08/25/2015 1200   CALCIUM 8.1* 12/15/2014 1729   ALKPHOS 67 08/25/2015 1200   AST 16 08/25/2015 1200   ALT 11 08/25/2015 1200   BILITOT 0.33 08/25/2015 1200     ASSESSMENT & PLAN:  Multiple myeloma in remission ([place])  She is currently receiving Zometa. She had mild reaction to Zometa previously with some bone pain and hives. We discussed potential lowering the dose to 3 mg. She wants to try it one more time at reduced dose Zometa. She will continue  Revlimid. She will continue calcium and vitamin D supplements along with aspirin.   Upper respiratory infection, acute  Her upper respiratory tract infection has improved. She did self discontinued Augmentin for fear of rash. We will continue conservative management with Flonase and cough syrup as needed.  Anemia due to antineoplastic chemotherapy This is likely due to recent treatment. The patient denies recent history of bleeding such as epistaxis, hematuria or hematochezia. She is asymptomatic from the anemia. I will observe for now.  She does not require transfusion now. I will continue the chemotherapy at current dose without dosage adjustment.  If the anemia gets progressive worse in the future, I might have to delay her treatment or adjust the chemotherapy dose.   Varicose vein of leg  She has bilateral lower extremity varicose veins.  I reassure her that this is benign.   Orders Placed This Encounter  Procedures  . Comprehensive metabolic panel    Standing Status: Future     Number of Occurrences:      Standing Expiration Date: 09/28/2016  . CBC with Differential/Platelet    Standing Status: Future     Number of Occurrences:      Standing Expiration Date: 09/28/2016  . Kappa/lambda light chains    Standing Status: Future     Number of Occurrences:      Standing Expiration Date: 09/28/2016  . Multiple Myeloma Panel (SPEP&IFE w/QIG)    Standing Status: Future     Number of Occurrences:      Standing Expiration Date: 09/28/2016   All questions were answered. The patient knows to call the clinic with any problems, questions or concerns. No barriers to learning was detected. I spent 15 minutes counseling the patient face to face. The total time spent in the appointment was 20 minutes and more than 50% was on counseling and review of test results     [facility], [place], MD 08/25/2015 1:30 PM

## 2015-08-25 NOTE — Patient Instructions (Signed)

## 2015-08-25 NOTE — Assessment & Plan Note (Signed)

## 2015-08-25 NOTE — Assessment & Plan Note (Signed)
Her upper respiratory tract infection has improved. She did self discontinued Augmentin for fear of rash. We will continue conservative management with Flonase and cough syrup as needed.

## 2015-08-25 NOTE — Assessment & Plan Note (Signed)
She is currently receiving Zometa. She had mild reaction to Zometa previously with some bone pain and hives. We discussed potential lowering the dose to 3 mg. She wants to try it one more time at reduced dose Zometa. She will continue Revlimid. She will continue calcium and vitamin D supplements along with aspirin.

## 2015-08-25 NOTE — Telephone Encounter (Signed)
Talked to patient here in office. Scheduled appt.       [facility]. °

## 2015-08-25 NOTE — Assessment & Plan Note (Signed)
She has bilateral lower extremity varicose veins.  I reassure her that this is benign.

## 2015-08-26 LAB — KAPPA/LAMBDA LIGHT CHAINS
Ig Kappa Free Light Chain: 6.01 mg/L (ref 3.30–19.40)
Ig Lambda Free Light Chain: 8.44 mg/L (ref 5.71–26.30)
Kappa/Lambda FluidC Ratio: 0.71 (ref 0.26–1.65)

## 2015-08-27 LAB — MULTIPLE MYELOMA PANEL, SERUM
Albumin SerPl Elph-Mcnc: 3.4 g/dL (ref 2.9–4.4)
Albumin/Glob SerPl: 1.4 (ref 0.7–1.7)
Alpha 1: 0.3 g/dL (ref 0.0–0.4)
Alpha2 Glob SerPl Elph-Mcnc: 0.9 g/dL (ref 0.4–1.0)
B-Globulin SerPl Elph-Mcnc: 0.9 g/dL (ref 0.7–1.3)
Gamma Glob SerPl Elph-Mcnc: 0.4 g/dL (ref 0.4–1.8)
Globulin, Total: 2.5 g/dL (ref 2.2–3.9)
IgA, Qn, Serum: <5 mg/dL — ABNORMAL LOW (ref 87–352)
IgG, Qn, Serum: 362 mg/dL — ABNORMAL LOW (ref 700–1600)
IgM, Qn, Serum: 12 mg/dL — ABNORMAL LOW (ref 26–217)
Total Protein: 5.9 g/dL — ABNORMAL LOW (ref 6.0–8.5)

## 2015-09-15 ENCOUNTER — Other Ambulatory Visit: Payer: Self-pay | Admitting: *Deleted

## 2015-09-15 MED ORDER — LENALIDOMIDE 10 MG PO CAPS
10.0000 mg | ORAL_CAPSULE | Freq: Every day | ORAL | Status: DC
Start: 2015-09-15 — End: 2015-10-15

## 2015-09-16 ENCOUNTER — Telehealth: Payer: Self-pay | Admitting: *Deleted

## 2015-09-16 NOTE — Telephone Encounter (Signed)
Pt left VM stating she needs refill on Revlimid.  Takes her last pill this Thursday.    Called pt back and left VM that refill was sent to [facility] yesterday.   Asked her to call [facility] to arrange delivery and call us back if any problems.  Gave her the phone number for [facility] to call.

## 2015-09-22 ENCOUNTER — Encounter: Payer: Self-pay | Admitting: Hematology and Oncology

## 2015-09-22 ENCOUNTER — Ambulatory Visit (HOSPITAL_BASED_OUTPATIENT_CLINIC_OR_DEPARTMENT_OTHER): Payer: Medicare Other | Admitting: Hematology and Oncology

## 2015-09-22 ENCOUNTER — Other Ambulatory Visit: Payer: Self-pay | Admitting: Hematology and Oncology

## 2015-09-22 ENCOUNTER — Telehealth: Payer: Self-pay | Admitting: Hematology and Oncology

## 2015-09-22 ENCOUNTER — Other Ambulatory Visit (HOSPITAL_BASED_OUTPATIENT_CLINIC_OR_DEPARTMENT_OTHER): Payer: Medicare Other

## 2015-09-22 ENCOUNTER — Telehealth: Payer: Self-pay | Admitting: *Deleted

## 2015-09-22 ENCOUNTER — Ambulatory Visit (HOSPITAL_BASED_OUTPATIENT_CLINIC_OR_DEPARTMENT_OTHER): Payer: Medicare Other

## 2015-09-22 VITALS — BP 119/72 | HR 76 | Temp 98.2°F | Resp 16 | Ht 61.0 in | Wt 116.6 lb

## 2015-09-22 DIAGNOSIS — C9001 Multiple myeloma in remission: Secondary | ICD-10-CM

## 2015-09-22 DIAGNOSIS — D6481 Anemia due to antineoplastic chemotherapy: Secondary | ICD-10-CM | POA: Diagnosis not present

## 2015-09-22 DIAGNOSIS — Z9481 Bone marrow transplant status: Secondary | ICD-10-CM

## 2015-09-22 DIAGNOSIS — T451X5A Adverse effect of antineoplastic and immunosuppressive drugs, initial encounter: Secondary | ICD-10-CM

## 2015-09-22 LAB — COMPREHENSIVE METABOLIC PANEL
ALT: 16 U/L (ref 0–55)
AST: 15 U/L (ref 5–34)
Albumin: 3.7 g/dL (ref 3.5–5.0)
Alkaline Phosphatase: 66 U/L (ref 40–150)
Anion Gap: 10 mEq/L (ref 3–11)
BUN: 18.3 mg/dL (ref 7.0–26.0)
CO2: 22 mEq/L (ref 22–29)
Calcium: 8.7 mg/dL (ref 8.4–10.4)
Chloride: 111 mEq/L — ABNORMAL HIGH (ref 98–109)
Creatinine: 1 mg/dL (ref 0.6–1.1)
EGFR: 57 mL/min/{1.73_m2} — ABNORMAL LOW (ref >90)
Glucose: 94 mg/dl (ref 70–140)
Potassium: 3.9 mEq/L (ref 3.5–5.1)
Sodium: 142 mEq/L (ref 136–145)
Total Bilirubin: 0.43 mg/dL (ref 0.20–1.20)
Total Protein: 6.1 g/dL — ABNORMAL LOW (ref 6.4–8.3)

## 2015-09-22 LAB — CBC WITH DIFFERENTIAL/PLATELET
BASO%: 3.5 % — ABNORMAL HIGH (ref 0.0–2.0)
Basophils Absolute: 0.1 10*3/uL (ref 0.0–0.1)
EOS%: 13.2 % — ABNORMAL HIGH (ref 0.0–7.0)
Eosinophils Absolute: 0.6 10*3/uL — ABNORMAL HIGH (ref 0.0–0.5)
HCT: 30 % — ABNORMAL LOW (ref 34.8–46.6)
HGB: 9.6 g/dL — ABNORMAL LOW (ref 11.6–15.9)
LYMPH%: 24.5 % (ref 14.0–49.7)
MCH: 27 pg (ref 25.1–34.0)
MCHC: 31.9 g/dL (ref 31.5–36.0)
MCV: 84.7 fL (ref 79.5–101.0)
MONO#: 0.3 10*3/uL (ref 0.1–0.9)
MONO%: 6.6 % (ref 0.0–14.0)
NEUT#: 2.2 10*3/uL (ref 1.5–6.5)
NEUT%: 52.2 % (ref 38.4–76.8)
Platelets: 142 10*3/uL — ABNORMAL LOW (ref 145–400)
RBC: 3.54 10*6/uL — ABNORMAL LOW (ref 3.70–5.45)
RDW: 17.8 % — ABNORMAL HIGH (ref 11.2–14.5)
WBC: 4.3 10*3/uL (ref 3.9–10.3)
lymph#: 1.1 10*3/uL (ref 0.9–3.3)

## 2015-09-22 MED ORDER — SODIUM CHLORIDE 0.9 % IV SOLN
Freq: Once | INTRAVENOUS | Status: AC
  Administered 2015-09-22: 14:00:00 via INTRAVENOUS

## 2015-09-22 MED ORDER — ZOLEDRONIC ACID 4 MG/5ML IV CONC
3.0000 mg | Freq: Once | INTRAVENOUS | Status: AC
  Administered 2015-09-22: 3 mg via INTRAVENOUS
  Filled 2015-09-22: qty 3.75

## 2015-09-22 NOTE — Telephone Encounter (Signed)
Per staff message and POF I have scheduled appts. Advised scheduler of appts. JMW  

## 2015-09-22 NOTE — Telephone Encounter (Signed)
per pof to sch pt appt-sent MAW email to sch trmt-gave pt copy of avs

## 2015-09-22 NOTE — Assessment & Plan Note (Signed)

## 2015-09-22 NOTE — Patient Instructions (Signed)

## 2015-09-22 NOTE — Assessment & Plan Note (Addendum)
She is currently receiving Zometa and Revlimid She had mild reaction to Zometa previously with some bone pain and hives, but doing better with reduced dose at 3 mg. We will continue to same dose. She will continue Revlimid. She will continue calcium and vitamin D supplements along with aspirin. Recent blood work suggested that she is in complete remission.

## 2015-09-22 NOTE — Progress Notes (Signed)
[place] OFFICE PROGRESS NOTE  Patient Care Team: Josetta Huddle, MD as PCP - General (Internal Medicine) Ginette Pitman, MD as Consulting Physician (Hematology and Oncology) Hessie Dibble, MD as Consulting Physician (Hematology and Oncology)  SUMMARY OF ONCOLOGIC HISTORY: Oncology History    M-protein 0.69 gm/dl IFIX - IgG, Kappa IgG - 868 IgA - 19 IgM - < 20 Kappa - 21 Lambda - 5.7  09/06/2014 - Bone marrow aspirate and biopsy:   Normocellular marrow for age (40%) with a small monoclonal plasma cell population (1% on aspirate). Karyotype 12, XX  FISH Negative for myeloma associated changes  09/12/2014 - PET/CT  Two regions that are concerning for disease, one adjacent/involving the left ninth rib and one in the marrow of the right femur, in this patient with history of plasmacytoma.      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   04/01/2015 Bone Marrow Transplant She received melphalan chemotherapy on 03/31/2015 followed by autologous stem cell  transplant the day after   04/03/2015 - 04/18/2015 Hospital Admission The patient was admitted to the hospital at [place] for management related to complication from stem cell transplant. She had significant nausea requiring intravenous anti-emetics.   07/17/2015 -  Chemotherapy She started maintenance Revlimid and monthly zometa    INTERVAL HISTORY: Please see below for problem oriented charting. She feels well. Denies recent infection. She tolerated reduce dose Zometa well. Denies bone pain.  REVIEW OF SYSTEMS:   Constitutional: Denies fevers, chills or abnormal weight loss Eyes: Denies blurriness of vision Ears, nose, mouth, throat, and face: Denies mucositis or sore throat Respiratory: Denies cough, dyspnea or wheezes Cardiovascular: Denies palpitation, chest discomfort or lower extremity swelling Gastrointestinal:  Denies nausea, heartburn or change in bowel habits Skin: Denies abnormal skin rashes Lymphatics: Denies new lymphadenopathy or easy bruising Neurological:Denies numbness, tingling or new weaknesses Behavioral/Psych: Mood is stable, no new changes  All other systems were reviewed with the patient and are negative.  I have reviewed the past medical history, past surgical history, social history and family history with the patient and they are unchanged from previous note.  ALLERGIES:  is allergic to codeine; ibuprofen; and augmentin.  MEDICATIONS:  Current Outpatient Prescriptions  Medication Sig Dispense Refill  . acyclovir (ZOVIRAX) 800 MG tablet Take 800 mg by mouth 2 (two) times daily.  1  . aspirin EC 81 MG tablet Take 81 mg by mouth daily with breakfast.    . fluticasone (FLONASE) 50 MCG/ACT nasal spray Place 1 spray into both  nostrils 2 (two) times daily. 16 g 2  . folic acid (FOLVITE) 1 MG tablet Take 1 mg by mouth daily.  0  . gabapentin (NEURONTIN) 300 MG capsule Take 300 mg by mouth at bedtime.  0  . HYDROcodone-homatropine (HYCODAN) 5-1.5 MG/5ML syrup  Take 5 mLs by mouth every 6 (six) hours as needed for cough. 180 mL 0  . lenalidomide (REVLIMID) 10 MG capsule Take 1 capsule (10 mg total) by mouth daily. 28 capsule 0  . Multiple Vitamin (MULTIVITAMIN) tablet Take 1 tablet by mouth daily.    Marland Kitchen omeprazole (PRILOSEC) 20 MG capsule take 1 capsule by mouth once daily 30 capsule 6  . ondansetron (ZOFRAN-ODT) 8 MG disintegrating tablet Take 8 mg by mouth every 8 (eight) hours as needed for nausea or vomiting.   0  . senna-docusate (SENOKOT-S) 8.6-50 MG tablet Take 2 tablets by mouth once as needed.    . sulfamethoxazole-trimethoprim (BACTRIM DS,SEPTRA DS) 800-160 MG tablet Take 1 tablet by mouth 3 (three) times a week.  0   No current facility-administered medications for this visit.    PHYSICAL EXAMINATION: ECOG PERFORMANCE STATUS: 0 - Asymptomatic  Filed Vitals:   09/22/15 1156  BP: 119/72  Pulse: 76  Temp: 98.2 F (36.8 C)  Resp: 16   Filed Weights   09/22/15 1156  Weight: 116 lb 9.6 oz (52.889 kg)    GENERAL:alert, no distress and comfortable SKIN: skin color, texture, turgor are normal, no rashes or significant lesions EYES: normal, Conjunctiva are pink and non-injected, sclera clear OROPHARYNX:no exudate, no erythema and lips, buccal mucosa, and tongue normal  NECK: supple, thyroid normal size, non-tender, without nodularity LYMPH:  no palpable lymphadenopathy in the cervical, axillary or inguinal LUNGS: clear to auscultation and percussion with normal breathing effort HEART: regular rate & rhythm and no murmurs and no lower extremity edema ABDOMEN:abdomen soft, non-tender and normal bowel sounds Musculoskeletal:no cyanosis of digits and no clubbing  NEURO: alert & oriented x 3 with fluent speech, no focal motor/sensory deficits  LABORATORY DATA:  I have reviewed the data as listed    Component Value Date/Time   NA 142 09/22/2015 1135   NA 142 12/15/2014 1729   K 3.9 09/22/2015 1135   K 2.6* 12/15/2014 1729   CL 108  12/15/2014 1729   CO2 22 09/22/2015 1135   CO2 25 12/15/2014 1729   GLUCOSE 94 09/22/2015 1135   GLUCOSE 81 12/15/2014 1729   BUN 18.3 09/22/2015 1135   BUN 5* 12/15/2014 1729   CREATININE 1.0 09/22/2015 1135   CREATININE 0.75 12/15/2014 1729   CALCIUM 8.7 09/22/2015 1135   CALCIUM 8.1* 12/15/2014 1729   PROT 6.1* 09/22/2015 1135   PROT 5.9* 08/25/2015 1159   ALBUMIN 3.7 09/22/2015 1135   AST 15 09/22/2015 1135   ALT 16 09/22/2015 1135   ALKPHOS 66 09/22/2015 1135   BILITOT 0.43 09/22/2015 1135   GFRNONAA >60 12/15/2014 1729   GFRAA >60 12/15/2014 1729    No results found for: SPEP, UPEP  Lab Results  Component Value Date   WBC 4.3 09/22/2015   NEUTROABS 2.2 09/22/2015   HGB 9.6* 09/22/2015   HCT 30.0* 09/22/2015   MCV 84.7 09/22/2015   PLT 142* 09/22/2015      Chemistry      Component Value Date/Time   NA 142 09/22/2015 1135   NA 142 12/15/2014 1729   K 3.9 09/22/2015 1135   K 2.6* 12/15/2014 1729   CL 108 12/15/2014 1729  CO2 22 09/22/2015 1135   CO2 25 12/15/2014 1729   BUN 18.3 09/22/2015 1135   BUN 5* 12/15/2014 1729   CREATININE 1.0 09/22/2015 1135   CREATININE 0.75 12/15/2014 1729      Component Value Date/Time   CALCIUM 8.7 09/22/2015 1135   CALCIUM 8.1* 12/15/2014 1729   ALKPHOS 66 09/22/2015 1135   AST 15 09/22/2015 1135   ALT 16 09/22/2015 1135   BILITOT 0.43 09/22/2015 1135       ASSESSMENT & PLAN:  Multiple myeloma in remission ([place])  She is currently receiving Zometa and Revlimid She had mild reaction to Zometa previously with some bone pain and hives, but doing better with reduced dose at 3 mg. We will continue to same dose. She will continue Revlimid. She will continue calcium and vitamin D supplements along with aspirin. Recent blood work suggested that she is in complete remission.   Anemia due to antineoplastic chemotherapy This is likely due to recent treatment. The patient denies recent history of bleeding such as epistaxis,  hematuria or hematochezia. She is asymptomatic from the anemia. I will observe for now.  She does not require transfusion now. I will continue the chemotherapy at current dose without dosage adjustment.  If the anemia gets progressive worse in the future, I might have to delay her treatment or adjust the chemotherapy dose.      Orders Placed This Encounter  Procedures  . Comprehensive metabolic panel    Standing Status: Standing     Number of Occurrences: 22     Standing Expiration Date: 09/21/2016  . CBC with Differential/Platelet    Standing Status: Standing     Number of Occurrences: 22     Standing Expiration Date: 09/21/2016   All questions were answered. The patient knows to call the clinic with any problems, questions or concerns. No barriers to learning was detected. I spent 15 minutes counseling the patient face to face. The total time spent in the appointment was 20 minutes and more than 50% was on counseling and review of test results     [facility], [place], MD 09/22/2015 1:02 PM

## 2015-09-23 LAB — KAPPA/LAMBDA LIGHT CHAINS
Ig Kappa Free Light Chain: 3.42 mg/L (ref 3.30–19.40)
Ig Lambda Free Light Chain: 3.55 mg/L — ABNORMAL LOW (ref 5.71–26.30)
Kappa/Lambda FluidC Ratio: 0.96 (ref 0.26–1.65)

## 2015-09-24 LAB — MULTIPLE MYELOMA PANEL, SERUM
Albumin SerPl Elph-Mcnc: 3.5 g/dL (ref 2.9–4.4)
Albumin/Glob SerPl: 1.7 (ref 0.7–1.7)
Alpha 1: 0.3 g/dL (ref 0.0–0.4)
Alpha2 Glob SerPl Elph-Mcnc: 0.7 g/dL (ref 0.4–1.0)
B-Globulin SerPl Elph-Mcnc: 0.9 g/dL (ref 0.7–1.3)
Gamma Glob SerPl Elph-Mcnc: 0.3 g/dL — ABNORMAL LOW (ref 0.4–1.8)
Globulin, Total: 2.1 g/dL — ABNORMAL LOW (ref 2.2–3.9)
IgA, Qn, Serum: <5 mg/dL — ABNORMAL LOW (ref 87–352)
IgG, Qn, Serum: 334 mg/dL — ABNORMAL LOW (ref 700–1600)
IgM, Qn, Serum: 6 mg/dL — ABNORMAL LOW (ref 26–217)
Total Protein: 5.6 g/dL — ABNORMAL LOW (ref 6.0–8.5)

## 2015-10-03 ENCOUNTER — Other Ambulatory Visit: Payer: Self-pay | Admitting: *Deleted

## 2015-10-03 MED ORDER — GABAPENTIN 300 MG PO CAPS: 300.0000 mg | ORAL_CAPSULE | Freq: Every day | ORAL | Status: DC

## 2015-10-14 ENCOUNTER — Telehealth: Payer: Self-pay | Admitting: Obstetrics and Gynecology

## 2015-10-14 NOTE — Telephone Encounter (Signed)
Reviewed ultrasound from [facility] in [place]. Completed on 07/23/15.   Right ovary/adnexa: Normal size, contour, and echotexture. There is a relatively simple appearing cystic lesion dominating the right ovary measured 2.4 cm. The ovary measures 2.4 x 1.9 x 1.9 cm. Volume = 4.5 ml.

## 2015-10-14 NOTE — Telephone Encounter (Signed)
Return call to patient.  Last annual exam with Dr. Quincy Simmonds 03/25/14.  Has had cancer treatment at [facility] in the mean time.  She was advised to follow up with Gyn regarding Ovarian Cyst.  Patient denies pain. She is scheduled for office visit tomorrow with Dr. Quincy Simmonds at 0930. Ultrasound result to Dr. Quincy Simmonds.  Routing to provider for final review. Patient agreeable to disposition. Will close encounter.

## 2015-10-14 NOTE — Telephone Encounter (Signed)
Patient called to report her doctor from [facility], Dr. Cassell Clement, recommended she come to our office for "a small cyst they found" on her ovary. Patient last seen 03/25/14 by Dr. Quincy Simmonds for her AEX.

## 2015-10-15 ENCOUNTER — Telehealth: Payer: Self-pay | Admitting: Obstetrics and Gynecology

## 2015-10-15 ENCOUNTER — Telehealth: Payer: Self-pay | Admitting: *Deleted

## 2015-10-15 ENCOUNTER — Ambulatory Visit (INDEPENDENT_AMBULATORY_CARE_PROVIDER_SITE_OTHER): Payer: Medicare Other | Admitting: Obstetrics and Gynecology

## 2015-10-15 ENCOUNTER — Encounter: Payer: Self-pay | Admitting: Obstetrics and Gynecology

## 2015-10-15 VITALS — BP 124/80 | HR 66 | Ht 61.0 in | Wt 116.0 lb

## 2015-10-15 DIAGNOSIS — N83201 Unspecified ovarian cyst, right side: Secondary | ICD-10-CM | POA: Diagnosis not present

## 2015-10-15 MED ORDER — LENALIDOMIDE 10 MG PO CAPS: 10.0000 mg | ORAL_CAPSULE | Freq: Every day | ORAL | Status: DC

## 2015-10-15 NOTE — Telephone Encounter (Signed)
Spoke with pt regarding benefit for ultrasound. Patient understood and agreeable. Has already been scheduled for 10/16/15 with Dr Quincy Simmonds. Pt aware of arrival date and time. Pt aware of 72 hours cancellation policy with [id number] fee. No further questions. Ok to close

## 2015-10-15 NOTE — Progress Notes (Signed)
Pelvic ultrasound scheduled for 10-16-15 at 1030 here in office. Patient agreeable to date and time.

## 2015-10-15 NOTE — Patient Instructions (Signed)
Ovarian Cyst An ovarian cyst is a fluid-filled sac that forms on an ovary. The ovaries are small organs that produce eggs in women. Various types of cysts can form on the ovaries. Most are not cancerous. Many do not cause problems, and they often go away on their own. Some may cause symptoms and require treatment. Common types of ovarian cysts include:  Functional cysts--These cysts may occur every month during the menstrual cycle. This is normal. The cysts usually go away with the next menstrual cycle if the woman does not get pregnant. Usually, there are no symptoms with a functional cyst.  Endometrioma cysts--These cysts form from the tissue that lines the uterus. They are also called "chocolate cysts" because they become filled with blood that turns brown. This type of cyst can cause pain in the lower abdomen during intercourse and with your menstrual period.  Cystadenoma cysts--This type develops from the cells on the outside of the ovary. These cysts can get very big and cause lower abdomen pain and pain with intercourse. This type of cyst can twist on itself, cut off its blood supply, and cause severe pain. It can also easily rupture and cause a lot of pain.  Dermoid cysts--This type of cyst is sometimes found in both ovaries. These cysts may contain different kinds of body tissue, such as skin, teeth, hair, or cartilage. They usually do not cause symptoms unless they get very big.  Theca lutein cysts--These cysts occur when too much of a certain hormone (human chorionic gonadotropin) is produced and overstimulates the ovaries to produce an egg. This is most common after procedures used to assist with the conception of a baby (in vitro fertilization). CAUSES   Fertility drugs can cause a condition in which multiple large cysts are formed on the ovaries. This is called ovarian hyperstimulation syndrome.  A condition called polycystic ovary syndrome can cause hormonal imbalances that can lead to  nonfunctional ovarian cysts. SIGNS AND SYMPTOMS  Many ovarian cysts do not cause symptoms. If symptoms are present, they may include:  Pelvic pain or pressure.  Pain in the lower abdomen.  Pain during sexual intercourse.  Increasing girth (swelling) of the abdomen.  Abnormal menstrual periods.  Increasing pain with menstrual periods.  Stopping having menstrual periods without being pregnant. DIAGNOSIS  These cysts are commonly found during a routine or annual pelvic exam. Tests may be ordered to find out more about the cyst. These tests may include:  Ultrasound.  X-ray of the pelvis.  CT scan.  MRI.  Blood tests. TREATMENT  Many ovarian cysts go away on their own without treatment. Your health care provider may want to check your cyst regularly for 2-3 months to see if it changes. For women in menopause, it is particularly important to monitor a cyst closely because of the higher rate of ovarian cancer in menopausal women. When treatment is needed, it may include any of the following:  A procedure to drain the cyst (aspiration). This may be done using a long needle and ultrasound. It can also be done through a laparoscopic procedure. This involves using a thin, lighted tube with a tiny camera on the end (laparoscope) inserted through a small incision.  Surgery to remove the whole cyst. This may be done using laparoscopic surgery or an open surgery involving a larger incision in the lower abdomen.  Hormone treatment or birth control pills. These methods are sometimes used to help dissolve a cyst. HOME CARE INSTRUCTIONS   Only take over-the-counter   or prescription medicines as directed by your health care provider.  Follow up with your health care provider as directed.  Get regular pelvic exams and Pap tests. SEEK MEDICAL CARE IF:   Your periods are late, irregular, or painful, or they stop.  Your pelvic pain or abdominal pain does not go away.  Your abdomen becomes  larger or swollen.  You have pressure on your bladder or trouble emptying your bladder completely.  You have pain during sexual intercourse.  You have feelings of fullness, pressure, or discomfort in your stomach.  You lose weight for no apparent reason.  You feel generally ill.  You become constipated.  You lose your appetite.  You develop acne.  You have an increase in body and facial hair.  You are gaining weight, without changing your exercise and eating habits.  You think you are pregnant. SEEK IMMEDIATE MEDICAL CARE IF:   You have increasing abdominal pain.  You feel sick to your stomach (nauseous), and you throw up (vomit).  You develop a fever that comes on suddenly.  You have abdominal pain during a bowel movement.  Your menstrual periods become heavier than usual. MAKE SURE YOU:  Understand these instructions.  Will watch your condition.  Will get help right away if you are not doing well or get worse.   This information is not intended to replace advice given to you by your health care provider. Make sure you discuss any questions you have with your health care provider.   Document Released: 07/26/2005 Document Revised: 07/31/2013 Document Reviewed: 04/02/2013 Elsevier Interactive Patient Education 2016 Elsevier Inc.  

## 2015-10-15 NOTE — Telephone Encounter (Signed)
Pt needs refill on Revlimid.

## 2015-10-15 NOTE — Progress Notes (Signed)
Patient ID: Jessica [name], female   DOB: 11/03/1950, 64 y.o.   MRN: [id number] GYNECOLOGY  VISIT   HPI: 64 y.o.   Married  Caucasian  female   G1P1 with Patient's last menstrual period was 08/10/2003.   here for evaluation of right ovarian lesion seen on pelvic ultrasound 01-28-16 at [facility].   Had an MRI at [facility], and this showed a cyst on her right ovary.  Had a pelvic ultrasound in follow up which showed a mostly simple cyst of the right ovary. 2.4 cm.  Left ovary normal, uterus normal with EMS 3 mm, no free fluid.   Denies any pelvic pain or bleeding.   Being treated for multiple myeloma.  Had brain surgery August 23, 2014.  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 Had radiation and chemotherapy.   GYNECOLOGIC HISTORY: Patient's last menstrual period was 08/10/2003. Contraception:Vasectomy/Postmenopausal Menopausal hormone therapy: none Last mammogram: 03-18-14 Density Cat.B/Neg/BiRads1:[facility] Last pap smear: 02-19-11 Neg--Hx 1990 CIN I cervix and laser treatment        OB History    Gravida Para Term Preterm AB TAB SAB Ectopic Multiple Living   1 1        1         Patient Active Problem List   Diagnosis Date Noted  . Varicose vein of leg 08/25/2015  . Upper respiratory infection, acute 08/12/2015  . Anemia due to antineoplastic chemotherapy 04/22/2015  . S/P bone marrow transplant (HCC) 04/22/2015  . Hypotension due to drugs 04/22/2015  . Petechiae 02/14/2015  . 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 Essential hypertension 02/07/2015  . Infection of eyelid 01/16/2015  . Protein calorie malnutrition (HCC) 01/16/2015  . Superficial thrombophlebitis of upper extremity 12/24/2014  . Sty, external 12/24/2014  . 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 Other constipation 12/03/2014  . Chemotherapy induced nausea and vomiting 12/03/2014  . Nausea with vomiting 12/02/2014  . Hypotension 12/02/2014  . Dehydration 12/02/2014  .  Weakness 12/02/2014  . Hyperphosphatemia 12/02/2014  . Multiple myeloma in remission (HCC) 11/19/2014  . Prerenal renal failure 11/19/2014  . 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 Neuropathic pain of left flank 11/19/2014  . Vomiting without nausea, not intractable 11/19/2014    Past Medical History  Diagnosis Date  . MVP (mitral valve prolapse)     req prophylaxis  . Other constipation 12/03/2014  . Upper respiratory infection, acute 08/12/2015  . Multiple myeloma (HCC) 11/19/2014  . Multiple myeloma (HCC)   . Multiple myeloma (HCC)   . H/O stem cell transplant (HCC) 03/2015    [facility]    Past Surgical History  Procedure Laterality Date  . Laser ablation of condylomas  1990    CIN 1 cervix  . Cataract extraction, bilateral  10/2010    Implants(ReSTOR) Bilat  . Multiple myeloma surgery Right 08/2014    [facility]    Current Outpatient Prescriptions  Medication Sig Dispense Refill  . acyclovir (ZOVIRAX) 800 MG tablet Take 800 mg by mouth 2 (two) times daily.  1  . aspirin EC 81 MG tablet Take 81 mg by mouth daily with breakfast.    . fluticasone (FLONASE) 50 MCG/ACT nasal spray Place 1 spray into both nostrils 2 (two) times daily. 16 g 2  . gabapentin (NEURONTIN) 300 MG capsule Take 1 capsule (300 mg total) by mouth at bedtime. 60 capsule 0  . HYDROcodone-homatropine (HYCODAN) 5-1.5 MG/5ML syrup Take 5 mLs by   mouth every 6 (six) hours as needed for cough. 180 mL 0  . lenalidomide (REVLIMID) 10 MG capsule Take 1 capsule (10 mg total) by mouth daily. 28 capsule 0  . Multiple Vitamin (MULTIVITAMIN) tablet Take 1 tablet by mouth daily.    . omeprazole (PRILOSEC) 20 MG capsule take 1 capsule by mouth once daily 30 capsule 6  . ondansetron (ZOFRAN-ODT) 8 MG disintegrating tablet Take 8 mg by mouth every 8 (eight) hours as needed for nausea or vomiting.   0  . senna-docusate (SENOKOT-S) 8.6-50 MG tablet Take 2 tablets by mouth once  as needed.     No current facility-administered medications for this visit.     ALLERGIES: Codeine; Ibuprofen; and Augmentin  Family History  Problem Relation Age of Onset  . Osteoporosis Mother     Social History   Social History  . Marital Status: Married    Spouse Name: N/A  . Number of Children: N/A  . Years of Education: N/A   Occupational History  . Not on file.   Social History Main Topics  . Smoking status: Never Smoker   . Smokeless tobacco: Never Used  . Alcohol Use: No  . Drug Use: No  . Sexual Activity:    Partners: Male    Birth Control/ Protection: Post-menopausal     Comment: vasectomy   Other Topics Concern  . Not on file   Social History Narrative    ROS:  Pertinent items are noted in HPI.  PHYSICAL EXAMINATION:    BP 124/80 mmHg  Pulse 66  Ht 5' 1" (1.549 m)  Wt 116 lb (52.617 kg)  BMI 21.93 kg/m2  LMP 08/10/2003    General appearance: alert, cooperative and appears stated age Head: Normocephalic, without obvious abnormality, atraumatic Lungs: clear to auscultation bilaterally Heart: regular rate and rhythm Abdomen: soft, non-tender; bowel sounds normal; no masses,  no organomegaly Extremities:  Varicose veins of right lower extremity.  No abnormal inguinal nodes palpated   Pelvic: External genitalia:  no lesions              Urethra:  normal appearing urethra with no masses, tenderness or lesions              Bartholins and Skenes: normal                 Vagina: normal appearing vagina with normal color and discharge, no lesions              Cervix:  Stenosis with scarring at vaginal apex on right.               Bimanual Exam:  Uterus:  normal size, contour, position, consistency, mobility, non-tender              Adnexa: normal adnexa and no mass, fullness, tenderness                Chaperone was present for exam.  ASSESSMENT  Multiple myeloma.  Simple right ovarian cyst.   PLAN  Counseled regarding ovarian cysts.  Return  for pelvic ultrasound and CA125.    An After Visit Summary was printed and given to the patient.  __15____ minutes face to face time of which over 50% was spent in counseling.     

## 2015-10-15 NOTE — Telephone Encounter (Signed)
Yes refill

## 2015-10-16 ENCOUNTER — Ambulatory Visit (INDEPENDENT_AMBULATORY_CARE_PROVIDER_SITE_OTHER): Payer: Medicare Other

## 2015-10-16 ENCOUNTER — Ambulatory Visit (INDEPENDENT_AMBULATORY_CARE_PROVIDER_SITE_OTHER): Payer: Medicare Other | Admitting: Obstetrics and Gynecology

## 2015-10-16 ENCOUNTER — Other Ambulatory Visit: Payer: Medicare Other

## 2015-10-16 ENCOUNTER — Other Ambulatory Visit: Payer: Medicare Other | Admitting: Obstetrics and Gynecology

## 2015-10-16 ENCOUNTER — Encounter: Payer: Self-pay | Admitting: Obstetrics and Gynecology

## 2015-10-16 VITALS — BP 124/82 | HR 70 | Ht 61.0 in | Wt 116.0 lb

## 2015-10-16 DIAGNOSIS — N83201 Unspecified ovarian cyst, right side: Secondary | ICD-10-CM | POA: Diagnosis not present

## 2015-10-16 HISTORY — DX: Unspecified ovarian cyst, right side: N83.201

## 2015-10-16 NOTE — Progress Notes (Signed)
Subjective  65 y.o. G1P1 Married Caucasian female here for pelvic ultrasound for follow up of a right ovarian cyst.   Patient's last menstrual period was 08/10/2003.   Had an MRI at [facility], and this showed a cyst on her right ovary.  Had a pelvic ultrasound in follow up which showed a mostly simple cyst of the right ovary. 2.4 cm. Left ovary normal, uterus normal with EMS 3 mm, no free fluid.   Has multiple myeloma.   Objective  Pelvic ultrasound images and report reviewed with patient.  Uterus - no masses.  EMS - 1.81 mm. Ovaries - right ovary with 16 mm simple cyst, avascular, smooth borders.  Left ovary normal.  Free fluid - no      Assessment   Simple right ovarian cyst.  Decreasing in size.   Plan  Discussion of ovarian cysts.  Will check CA125.  OK to do follow up ultrasound yearly.  I recommend returning for annual exam in 3 months.  I also recommend mammogram at [facility].  Patient has phone number and will call.   _15______ minutes face to face time of which over 50% was spent in counseling.   After visit summary to patient.

## 2015-10-17 ENCOUNTER — Encounter: Payer: Self-pay | Admitting: Hematology and Oncology

## 2015-10-17 LAB — CA 125: CA 125: 8 U/mL (ref <35)

## 2015-10-17 NOTE — Progress Notes (Signed)
Per [facility] revlimid was shipped via [employer] 10/16/15

## 2015-10-20 ENCOUNTER — Telehealth: Payer: Self-pay | Admitting: Emergency Medicine

## 2015-10-20 ENCOUNTER — Encounter: Payer: Self-pay | Admitting: Hematology and Oncology

## 2015-10-20 ENCOUNTER — Telehealth: Payer: Self-pay | Admitting: Hematology and Oncology

## 2015-10-20 ENCOUNTER — Ambulatory Visit (HOSPITAL_BASED_OUTPATIENT_CLINIC_OR_DEPARTMENT_OTHER): Payer: Medicare Other | Admitting: Hematology and Oncology

## 2015-10-20 ENCOUNTER — Ambulatory Visit (HOSPITAL_BASED_OUTPATIENT_CLINIC_OR_DEPARTMENT_OTHER): Payer: Medicare Other

## 2015-10-20 ENCOUNTER — Other Ambulatory Visit (HOSPITAL_BASED_OUTPATIENT_CLINIC_OR_DEPARTMENT_OTHER): Payer: Medicare Other

## 2015-10-20 VITALS — BP 132/63 | HR 71 | Temp 98.3°F | Resp 18 | Ht 61.0 in | Wt 116.5 lb

## 2015-10-20 DIAGNOSIS — D61818 Other pancytopenia: Secondary | ICD-10-CM | POA: Diagnosis not present

## 2015-10-20 DIAGNOSIS — C9001 Multiple myeloma in remission: Secondary | ICD-10-CM | POA: Diagnosis not present

## 2015-10-20 DIAGNOSIS — R0981 Nasal congestion: Secondary | ICD-10-CM | POA: Diagnosis not present

## 2015-10-20 DIAGNOSIS — Z9481 Bone marrow transplant status: Secondary | ICD-10-CM

## 2015-10-20 DIAGNOSIS — D6481 Anemia due to antineoplastic chemotherapy: Secondary | ICD-10-CM

## 2015-10-20 LAB — CBC WITH DIFFERENTIAL/PLATELET
BASO%: 1.8 % (ref 0.0–2.0)
Basophils Absolute: 0.1 10*3/uL (ref 0.0–0.1)
EOS%: 11.2 % — ABNORMAL HIGH (ref 0.0–7.0)
Eosinophils Absolute: 0.4 10*3/uL (ref 0.0–0.5)
HCT: 29.3 % — ABNORMAL LOW (ref 34.8–46.6)
HGB: 9.4 g/dL — ABNORMAL LOW (ref 11.6–15.9)
LYMPH%: 33.5 % (ref 14.0–49.7)
MCH: 28.1 pg (ref 25.1–34.0)
MCHC: 32.1 g/dL (ref 31.5–36.0)
MCV: 87.7 fL (ref 79.5–101.0)
MONO#: 0.3 10*3/uL (ref 0.1–0.9)
MONO%: 8.8 % (ref 0.0–14.0)
NEUT#: 1.5 10*3/uL (ref 1.5–6.5)
NEUT%: 44.7 % (ref 38.4–76.8)
Platelets: 186 10*3/uL (ref 145–400)
RBC: 3.34 10*6/uL — ABNORMAL LOW (ref 3.70–5.45)
RDW: 18.5 % — ABNORMAL HIGH (ref 11.2–14.5)
WBC: 3.4 10*3/uL — ABNORMAL LOW (ref 3.9–10.3)
lymph#: 1.1 10*3/uL (ref 0.9–3.3)

## 2015-10-20 LAB — COMPREHENSIVE METABOLIC PANEL
ALT: 15 U/L (ref 0–55)
AST: 14 U/L (ref 5–34)
Albumin: 3.6 g/dL (ref 3.5–5.0)
Alkaline Phosphatase: 64 U/L (ref 40–150)
Anion Gap: 6 mEq/L (ref 3–11)
BUN: 12.5 mg/dL (ref 7.0–26.0)
CO2: 25 mEq/L (ref 22–29)
Calcium: 8.5 mg/dL (ref 8.4–10.4)
Chloride: 113 mEq/L — ABNORMAL HIGH (ref 98–109)
Creatinine: 1 mg/dL (ref 0.6–1.1)
EGFR: 61 mL/min/{1.73_m2} — ABNORMAL LOW (ref >90)
Glucose: 92 mg/dl (ref 70–140)
Potassium: 3.8 mEq/L (ref 3.5–5.1)
Sodium: 144 mEq/L (ref 136–145)
Total Bilirubin: 0.45 mg/dL (ref 0.20–1.20)
Total Protein: 6 g/dL — ABNORMAL LOW (ref 6.4–8.3)

## 2015-10-20 MED ORDER — HYDROCODONE-HOMATROPINE 5-1.5 MG/5ML PO SYRP: 5.0000 mL | ORAL_SOLUTION | Freq: Four times a day (QID) | ORAL | Status: DC | PRN

## 2015-10-20 MED ORDER — ZOLEDRONIC ACID 4 MG/5ML IV CONC
3.0000 mg | Freq: Once | INTRAVENOUS | Status: AC
  Administered 2015-10-20: 3 mg via INTRAVENOUS
  Filled 2015-10-20: qty 3.75

## 2015-10-20 MED ORDER — SODIUM CHLORIDE 0.9 % IV SOLN
Freq: Once | INTRAVENOUS | Status: AC
  Administered 2015-10-20: 13:00:00 via INTRAVENOUS

## 2015-10-20 NOTE — Telephone Encounter (Signed)
Patient returning your call.

## 2015-10-20 NOTE — Telephone Encounter (Signed)
Patient returned call and she is given results from Dr. Quincy Simmonds.  She verbalizes understanding and will follow up as scheduled.  Routing to provider for final review. Patient agreeable to disposition. Will close encounter.

## 2015-10-20 NOTE — Patient Instructions (Signed)

## 2015-10-20 NOTE — Telephone Encounter (Signed)
Call to patient. She is unavailable. Will return call.

## 2015-10-20 NOTE — Assessment & Plan Note (Signed)
She has chronic nasal congestion with mild eosinophilia, likely related to allergies. I refilled her prescription of cough syrup and recommend she take over-the-counter allergy medicine.

## 2015-10-20 NOTE — Assessment & Plan Note (Addendum)
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 She will continue antimicrobials prophylaxis as directed by [facility]. She had received recent vaccination schedule at [facility]

## 2015-10-20 NOTE — Assessment & Plan Note (Signed)
She is currently receiving Zometa and Revlimid She had mild reaction to Zometa previously with some bone pain and hives, but doing better with reduced dose at 3 mg. We will continue to same dose. She will continue Revlimid. She will continue calcium and vitamin D supplements along with aspirin. Recent blood work suggested that she is in complete remission.

## 2015-10-20 NOTE — Telephone Encounter (Signed)
Gave and printed appt sched and avs for pt for April thru June °

## 2015-10-20 NOTE — Assessment & Plan Note (Signed)
This is likely due to recent treatment. The patient denies recent history of bleeding such as epistaxis, hematuria or hematochezia. She is asymptomatic from the anemia and leukopenia. I will observe for now.  She does not require transfusion now. I will continue the chemotherapy at current dose without dosage adjustment.  If the anemia gets progressive worse in the future, I might have to delay her treatment or adjust the chemotherapy dose.

## 2015-10-20 NOTE — Progress Notes (Signed)
[place] OFFICE PROGRESS NOTE  Patient Care Team: Josetta Huddle, MD as PCP - General (Internal Medicine) Ginette Pitman, MD as Consulting Physician (Hematology and Oncology) Hessie Dibble, MD as Consulting Physician (Hematology and Oncology)  SUMMARY OF ONCOLOGIC HISTORY: Oncology History    M-protein 0.69 gm/dl IFIX - IgG, Kappa IgG - 868 IgA - 19 IgM - < 20 Kappa - 21 Lambda - 5.7  09/06/2014 - Bone marrow aspirate and biopsy:   Normocellular marrow for age (40%) with a small monoclonal plasma cell population (1% on aspirate). Karyotype 35, XX  FISH Negative for myeloma associated changes  09/12/2014 - PET/CT  Two regions that are concerning for disease, one adjacent/involving the left ninth rib and one in the marrow of the right femur, in this patient with history of plasmacytoma.      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   04/01/2015 Bone Marrow Transplant She received melphalan chemotherapy on 03/31/2015 followed by autologous stem cell  transplant the day after   04/03/2015 - 04/18/2015 Hospital Admission The patient was admitted to the hospital at [place] for management related to complication from stem cell transplant. She had significant nausea requiring intravenous anti-emetics.   07/17/2015 -  Chemotherapy She started maintenance Revlimid and monthly zometa    INTERVAL HISTORY: Please see below for problem oriented charting.. She returns for further follow-up. She complained of nasal congestion. No recent fevers or chills. Denies nausea. She has completed recent follow-up at [facility] and receive some post transplant vaccination. The patient denies any recent signs or symptoms of bleeding such as spontaneous epistaxis, hematuria or hematochezia.  REVIEW OF SYSTEMS:   Constitutional: Denies fevers, chills or abnormal weight loss Eyes: Denies blurriness of vision Ears, nose, mouth, throat, and face: Denies mucositis or sore throat Respiratory: Denies cough, dyspnea or wheezes Cardiovascular: Denies palpitation, chest discomfort or lower extremity swelling Gastrointestinal:  Denies nausea, heartburn or change in bowel habits Skin: Denies abnormal skin rashes Lymphatics: Denies new lymphadenopathy or easy bruising Neurological:Denies numbness, tingling or new weaknesses Behavioral/Psych: Mood is stable, no new changes  All other systems were reviewed with the patient and are negative.  I have reviewed the past medical history, past surgical history, social history and family history with the patient and they are unchanged from previous note.  ALLERGIES:  is allergic to codeine; ibuprofen; and augmentin.  MEDICATIONS:  Current Outpatient Prescriptions  Medication Sig Dispense Refill  . acyclovir (ZOVIRAX) 800 MG tablet Take 800 mg by mouth 2 (two) times  daily.  1  . aspirin EC 81 MG tablet Take 81 mg by mouth daily with breakfast.    . fluticasone (FLONASE) 50 MCG/ACT nasal spray Place 1 spray into both  nostrils 2 (two) times daily. 16 g 2  . gabapentin (NEURONTIN) 300 MG capsule Take 1 capsule (300 mg total) by mouth at bedtime. 60 capsule 0  . HYDROcodone-homatropine (HYCODAN) 5-1.5 MG/5ML syrup Take 5 mLs by mouth every 6 (six) hours as needed for cough. 180 mL 0  . lenalidomide (REVLIMID) 10 MG capsule Take 1 capsule (10 mg total) by mouth daily. 28 capsule 0  . Multiple Vitamin (MULTIVITAMIN) tablet Take 1 tablet by mouth daily.    Marland Kitchen omeprazole (PRILOSEC) 20 MG capsule take 1 capsule by mouth once daily 30 capsule 6  . ondansetron (ZOFRAN-ODT) 8 MG disintegrating tablet Take 8 mg by mouth every 8 (eight) hours as needed for nausea or vomiting.   0  . senna-docusate (SENOKOT-S) 8.6-50 MG tablet Take 2 tablets by mouth once as needed.     No current facility-administered medications for this visit.   Facility-Administered Medications Ordered in Other Visits  Medication Dose Route Frequency Provider Last Rate Last Dose  . zolendronic acid (ZOMETA) 3 mg in sodium chloride 0.9 % 100 mL IVPB  3 mg Intravenous Once [name], MD        PHYSICAL EXAMINATION: ECOG PERFORMANCE STATUS: 1 - Symptomatic but completely ambulatory  Filed Vitals:   10/20/15 1143  BP: 132/63  Pulse: 71  Temp: 98.3 F (36.8 C)  Resp: 18   Filed Weights   10/20/15 1143  Weight: 116 lb 8.6 oz (52.862 kg)    GENERAL:alert, no distress and comfortable SKIN: skin color, texture, turgor are normal, no rashes or significant lesions EYES: normal, Conjunctiva are pink and non-injected, sclera clear Musculoskeletal:no cyanosis of digits and no clubbing  NEURO: alert & oriented x 3 with fluent speech, no focal motor/sensory deficits  LABORATORY DATA:  I have reviewed the data as listed    Component Value Date/Time   NA 144 10/20/2015 1134   NA 142 12/15/2014 1729   K 3.8 10/20/2015 1134   K 2.6* 12/15/2014 1729   CL 108 12/15/2014 1729   CO2 25 10/20/2015 1134   CO2 25 12/15/2014 1729   GLUCOSE 92  10/20/2015 1134   GLUCOSE 81 12/15/2014 1729   BUN 12.5 10/20/2015 1134   BUN 5* 12/15/2014 1729   CREATININE 1.0 10/20/2015 1134   CREATININE 0.75 12/15/2014 1729   CALCIUM 8.5 10/20/2015 1134   CALCIUM 8.1* 12/15/2014 1729   PROT 6.0* 10/20/2015 1134   PROT 5.6* 09/22/2015 1135   ALBUMIN 3.6 10/20/2015 1134   AST 14 10/20/2015 1134   ALT 15 10/20/2015 1134   ALKPHOS 64 10/20/2015 1134   BILITOT 0.45 10/20/2015 1134   GFRNONAA >60 12/15/2014 1729   GFRAA >60 12/15/2014 1729    No results found for: SPEP, UPEP  Lab Results  Component Value Date   WBC 3.4* 10/20/2015   NEUTROABS 1.5 10/20/2015   HGB 9.4* 10/20/2015   HCT 29.3* 10/20/2015   MCV 87.7 10/20/2015   PLT 186 10/20/2015      Chemistry      Component Value Date/Time   NA 144 10/20/2015 1134   NA 142 12/15/2014 1729   K 3.8 10/20/2015 1134   K 2.6* 12/15/2014 1729   CL 108 12/15/2014 1729   CO2 25 10/20/2015 1134   CO2 25 12/15/2014 1729  BUN 12.5 10/20/2015 1134   BUN 5* 12/15/2014 1729   CREATININE 1.0 10/20/2015 1134   CREATININE 0.75 12/15/2014 1729      Component Value Date/Time   CALCIUM 8.5 10/20/2015 1134   CALCIUM 8.1* 12/15/2014 1729   ALKPHOS 64 10/20/2015 1134   AST 14 10/20/2015 1134   ALT 15 10/20/2015 1134   BILITOT 0.45 10/20/2015 1134      ASSESSMENT & PLAN:  Multiple myeloma in remission ([place])  She is currently receiving Zometa and Revlimid She had mild reaction to Zometa previously with some bone pain and hives, but doing better with reduced dose at 3 mg. We will continue to same dose. She will continue Revlimid. She will continue calcium and vitamin D supplements along with aspirin. Recent blood work suggested that she is in complete remission.    S/P bone marrow transplant [facility]) 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 She will continue antimicrobials prophylaxis as directed by [facility]. She had received recent vaccination schedule  at [facility]    Pancytopenia, acquired [facility]) This is likely due to recent treatment. The patient denies recent history of bleeding such as epistaxis, hematuria or hematochezia. She is asymptomatic from the anemia and leukopenia. I will observe for now.  She does not require transfusion now. I will continue the chemotherapy at current dose without dosage adjustment.  If the anemia gets progressive worse in the future, I might have to delay her treatment or adjust the chemotherapy dose.   Nasal congestion She has chronic nasal congestion with mild eosinophilia, likely related to allergies. I refilled her prescription of cough syrup and recommend she take over-the-counter allergy medicine.   Orders Placed This Encounter  Procedures  . Multiple Myeloma Panel (SPEP&IFE w/QIG)    Standing Status: Future     Number of Occurrences:      Standing Expiration Date: 11/23/2016  . Kappa/lambda light chains    Standing Status: Future     Number of Occurrences:      Standing Expiration Date: 11/23/2016   All questions were answered. The patient knows to call the clinic with any problems, questions or concerns. No barriers to learning was detected. I spent 15 minutes counseling the patient face to face. The total time spent in the appointment was 20 minutes and more than 50% was on counseling and review of test results     [facility], [place], MD 10/20/2015 12:46 PM

## 2015-10-20 NOTE — Telephone Encounter (Signed)
Call to patient. No answer. Will try again.

## 2015-10-20 NOTE — Telephone Encounter (Signed)
-----   Message from Nunzio Cobbs, MD sent at 10/17/2015  5:58 PM EST ----- Please inform patient of the normal CA125.  This is what we were expecting.   I will see her back for her annual exam!  Cc- Marisa Sprinkles

## 2015-11-03 ENCOUNTER — Encounter: Payer: Self-pay | Admitting: Hematology and Oncology

## 2015-11-03 NOTE — Progress Notes (Signed)
Patient's spouse Marcello Moores dropped off physician form for [facility] to be completed by dr. Gabriel Rainwater signature from [place] and faxed to LLS. Called Mr.Brasel to let him know this had been completed. He will pick up a copy from the front desk in about an hour. Placed original at [employer] for her records.

## 2015-11-05 ENCOUNTER — Telehealth: Payer: Self-pay | Admitting: *Deleted

## 2015-11-05 MED ORDER — LENALIDOMIDE 10 MG PO CAPS: 10.0000 mg | ORAL_CAPSULE | Freq: Every day | ORAL | Status: DC

## 2015-11-05 NOTE — Telephone Encounter (Signed)
Pt called states has 8 days Revlimid left then will need new Rx.  She is on maintenance treatment every day (does not take a week off).   New Rx sent electronically, but unable to get new Auth number this soon.  Called [employer] for new Auth number and they say it is too soon today but can be obtained after pt has less than 7 pills left.  Will try to get new auth number in 2 days and resend refill w/ new Auth number to [facility].

## 2015-11-06 ENCOUNTER — Other Ambulatory Visit: Payer: Self-pay | Admitting: *Deleted

## 2015-11-06 MED ORDER — LENALIDOMIDE 10 MG PO CAPS: 10.0000 mg | ORAL_CAPSULE | Freq: Every day | ORAL | Status: DC

## 2015-11-20 ENCOUNTER — Encounter: Payer: Self-pay | Admitting: Hematology and Oncology

## 2015-11-20 ENCOUNTER — Other Ambulatory Visit (HOSPITAL_BASED_OUTPATIENT_CLINIC_OR_DEPARTMENT_OTHER): Payer: Medicare Other

## 2015-11-20 ENCOUNTER — Ambulatory Visit (HOSPITAL_BASED_OUTPATIENT_CLINIC_OR_DEPARTMENT_OTHER): Payer: Medicare Other

## 2015-11-20 VITALS — BP 117/47 | HR 70 | Temp 98.7°F | Resp 18

## 2015-11-20 DIAGNOSIS — C9001 Multiple myeloma in remission: Secondary | ICD-10-CM

## 2015-11-20 DIAGNOSIS — Z9481 Bone marrow transplant status: Secondary | ICD-10-CM

## 2015-11-20 LAB — CBC WITH DIFFERENTIAL/PLATELET
BASO%: 2.6 % — ABNORMAL HIGH (ref 0.0–2.0)
Basophils Absolute: 0.1 10*3/uL (ref 0.0–0.1)
EOS%: 18.2 % — ABNORMAL HIGH (ref 0.0–7.0)
Eosinophils Absolute: 0.9 10*3/uL — ABNORMAL HIGH (ref 0.0–0.5)
HCT: 31.7 % — ABNORMAL LOW (ref 34.8–46.6)
HGB: 10.2 g/dL — ABNORMAL LOW (ref 11.6–15.9)
LYMPH%: 23.4 % (ref 14.0–49.7)
MCH: 28.7 pg (ref 25.1–34.0)
MCHC: 32.2 g/dL (ref 31.5–36.0)
MCV: 89.2 fL (ref 79.5–101.0)
MONO#: 0.4 10*3/uL (ref 0.1–0.9)
MONO%: 8.3 % (ref 0.0–14.0)
NEUT#: 2.3 10*3/uL (ref 1.5–6.5)
NEUT%: 47.5 % (ref 38.4–76.8)
Platelets: 190 10*3/uL (ref 145–400)
RBC: 3.55 10*6/uL — ABNORMAL LOW (ref 3.70–5.45)
RDW: 18.1 % — ABNORMAL HIGH (ref 11.2–14.5)
WBC: 4.9 10*3/uL (ref 3.9–10.3)
lymph#: 1.1 10*3/uL (ref 0.9–3.3)

## 2015-11-20 LAB — COMPREHENSIVE METABOLIC PANEL
ALT: 19 U/L (ref 0–55)
AST: 18 U/L (ref 5–34)
Albumin: 3.7 g/dL (ref 3.5–5.0)
Alkaline Phosphatase: 81 U/L (ref 40–150)
Anion Gap: 9 mEq/L (ref 3–11)
BUN: 14.8 mg/dL (ref 7.0–26.0)
CO2: 22 mEq/L (ref 22–29)
Calcium: 9.2 mg/dL (ref 8.4–10.4)
Chloride: 112 mEq/L — ABNORMAL HIGH (ref 98–109)
Creatinine: 1 mg/dL (ref 0.6–1.1)
EGFR: 62 mL/min/{1.73_m2} — ABNORMAL LOW (ref >90)
Glucose: 76 mg/dl (ref 70–140)
Potassium: 3.8 mEq/L (ref 3.5–5.1)
Sodium: 144 mEq/L (ref 136–145)
Total Bilirubin: 0.41 mg/dL (ref 0.20–1.20)
Total Protein: 6.3 g/dL — ABNORMAL LOW (ref 6.4–8.3)

## 2015-11-20 MED ORDER — SODIUM CHLORIDE 0.9 % IV SOLN
Freq: Once | INTRAVENOUS | Status: AC
  Administered 2015-11-20: 12:00:00 via INTRAVENOUS

## 2015-11-20 MED ORDER — SODIUM CHLORIDE 0.9 % IV SOLN
3.0000 mg | Freq: Once | INTRAVENOUS | Status: AC
  Administered 2015-11-20: 3 mg via INTRAVENOUS
  Filled 2015-11-20: qty 3.75

## 2015-11-20 NOTE — Progress Notes (Signed)
Pt is approved w/ [facility]LS for ins premiums and/or eligible pharmaceutical copays prescribed in relation to her Dx from 10/08/15 to 10/06/16 for $10,000.  I will forward copy of approval letter to [facility] in the billing dept.

## 2015-11-20 NOTE — Patient Instructions (Signed)

## 2015-11-27 ENCOUNTER — Other Ambulatory Visit: Payer: Self-pay | Admitting: Hematology and Oncology

## 2015-12-05 ENCOUNTER — Telehealth: Payer: Self-pay | Admitting: *Deleted

## 2015-12-05 NOTE — Telephone Encounter (Signed)
Pt left message- needs refill Revlimid

## 2015-12-08 ENCOUNTER — Other Ambulatory Visit: Payer: Self-pay | Admitting: *Deleted

## 2015-12-08 MED ORDER — LENALIDOMIDE 10 MG PO CAPS: 10.0000 mg | ORAL_CAPSULE | Freq: Every day | ORAL | Status: DC

## 2015-12-08 NOTE — Telephone Encounter (Signed)
Pls refill electronically °

## 2015-12-19 ENCOUNTER — Other Ambulatory Visit (HOSPITAL_BASED_OUTPATIENT_CLINIC_OR_DEPARTMENT_OTHER): Payer: Medicare Other

## 2015-12-19 ENCOUNTER — Ambulatory Visit (HOSPITAL_BASED_OUTPATIENT_CLINIC_OR_DEPARTMENT_OTHER): Payer: Medicare Other

## 2015-12-19 VITALS — BP 123/89 | HR 62 | Temp 98.8°F | Resp 18

## 2015-12-19 DIAGNOSIS — Z9481 Bone marrow transplant status: Secondary | ICD-10-CM

## 2015-12-19 DIAGNOSIS — C9001 Multiple myeloma in remission: Secondary | ICD-10-CM

## 2015-12-19 LAB — COMPREHENSIVE METABOLIC PANEL
ALT: 34 U/L (ref 0–55)
AST: 27 U/L (ref 5–34)
Albumin: 3.9 g/dL (ref 3.5–5.0)
Alkaline Phosphatase: 109 U/L (ref 40–150)
Anion Gap: 9 mEq/L (ref 3–11)
BUN: 14.3 mg/dL (ref 7.0–26.0)
CO2: 21 mEq/L — ABNORMAL LOW (ref 22–29)
Calcium: 9.6 mg/dL (ref 8.4–10.4)
Chloride: 111 mEq/L — ABNORMAL HIGH (ref 98–109)
Creatinine: 1.1 mg/dL (ref 0.6–1.1)
EGFR: 54 mL/min/{1.73_m2} — ABNORMAL LOW (ref >90)
Glucose: 84 mg/dl (ref 70–140)
Potassium: 4.1 mEq/L (ref 3.5–5.1)
Sodium: 142 mEq/L (ref 136–145)
Total Bilirubin: 0.48 mg/dL (ref 0.20–1.20)
Total Protein: 6.6 g/dL (ref 6.4–8.3)

## 2015-12-19 LAB — CBC WITH DIFFERENTIAL/PLATELET
BASO%: 2.1 % — ABNORMAL HIGH (ref 0.0–2.0)
Basophils Absolute: 0.1 10*3/uL (ref 0.0–0.1)
EOS%: 19.2 % — ABNORMAL HIGH (ref 0.0–7.0)
Eosinophils Absolute: 0.8 10*3/uL — ABNORMAL HIGH (ref 0.0–0.5)
HCT: 31.8 % — ABNORMAL LOW (ref 34.8–46.6)
HGB: 10.2 g/dL — ABNORMAL LOW (ref 11.6–15.9)
LYMPH%: 30.6 % (ref 14.0–49.7)
MCH: 28.6 pg (ref 25.1–34.0)
MCHC: 32 g/dL (ref 31.5–36.0)
MCV: 89.5 fL (ref 79.5–101.0)
MONO#: 0.2 10*3/uL (ref 0.1–0.9)
MONO%: 5.3 % (ref 0.0–14.0)
NEUT#: 1.8 10*3/uL (ref 1.5–6.5)
NEUT%: 42.8 % (ref 38.4–76.8)
Platelets: 133 10*3/uL — ABNORMAL LOW (ref 145–400)
RBC: 3.55 10*6/uL — ABNORMAL LOW (ref 3.70–5.45)
RDW: 16.4 % — ABNORMAL HIGH (ref 11.2–14.5)
WBC: 4.2 10*3/uL (ref 3.9–10.3)
lymph#: 1.3 10*3/uL (ref 0.9–3.3)

## 2015-12-19 MED ORDER — SODIUM CHLORIDE 0.9 % IV SOLN
Freq: Once | INTRAVENOUS | Status: AC
  Administered 2015-12-19: 12:00:00 via INTRAVENOUS

## 2015-12-19 MED ORDER — ZOLEDRONIC ACID 4 MG/5ML IV CONC
3.0000 mg | Freq: Once | INTRAVENOUS | Status: AC
  Administered 2015-12-19: 3 mg via INTRAVENOUS
  Filled 2015-12-19: qty 3.75

## 2015-12-19 NOTE — Patient Instructions (Signed)

## 2015-12-22 LAB — KAPPA/LAMBDA LIGHT CHAINS
Ig Kappa Free Light Chain: 4.82 mg/L (ref 3.30–19.40)
Ig Lambda Free Light Chain: 7.47 mg/L (ref 5.71–26.30)
Kappa/Lambda FluidC Ratio: 0.65 (ref 0.26–1.65)

## 2015-12-24 LAB — MULTIPLE MYELOMA PANEL, SERUM
Albumin SerPl Elph-Mcnc: 3.7 g/dL (ref 2.9–4.4)
Albumin/Glob SerPl: 1.5 (ref 0.7–1.7)
Alpha 1: 0.3 g/dL (ref 0.0–0.4)
Alpha2 Glob SerPl Elph-Mcnc: 0.8 g/dL (ref 0.4–1.0)
B-Globulin SerPl Elph-Mcnc: 1 g/dL (ref 0.7–1.3)
Gamma Glob SerPl Elph-Mcnc: 0.3 g/dL — ABNORMAL LOW (ref 0.4–1.8)
Globulin, Total: 2.5 g/dL (ref 2.2–3.9)
IgA, Qn, Serum: 6 mg/dL — ABNORMAL LOW (ref 87–352)
IgG, Qn, Serum: 391 mg/dL — ABNORMAL LOW (ref 700–1600)
IgM, Qn, Serum: 10 mg/dL — ABNORMAL LOW (ref 26–217)
Total Protein: 6.2 g/dL (ref 6.0–8.5)

## 2016-01-02 ENCOUNTER — Telehealth: Payer: Self-pay | Admitting: *Deleted

## 2016-01-02 MED ORDER — LENALIDOMIDE 10 MG PO CAPS: 10.0000 mg | ORAL_CAPSULE | Freq: Every day | ORAL | Status: DC

## 2016-01-02 MED ORDER — ONDANSETRON HCL 8 MG PO TABS: 8.0000 mg | ORAL_TABLET | Freq: Three times a day (TID) | ORAL | Status: DC | PRN

## 2016-01-02 NOTE — Telephone Encounter (Signed)
Pt requests refill on Zofran sent to [facility] and has 6 days left of Revlimid.   Refill Revlimid sent to [facility].  Instructed pt to call us back if any problems with refills.  She verbalized understanding.

## 2016-01-09 ENCOUNTER — Encounter: Payer: Self-pay | Admitting: Hematology and Oncology

## 2016-01-09 ENCOUNTER — Encounter: Payer: Self-pay | Admitting: Obstetrics and Gynecology

## 2016-01-09 ENCOUNTER — Ambulatory Visit (INDEPENDENT_AMBULATORY_CARE_PROVIDER_SITE_OTHER): Payer: Medicare Other | Admitting: Obstetrics and Gynecology

## 2016-01-09 VITALS — BP 120/76 | HR 80 | Resp 22 | Ht 60.25 in | Wt 114.0 lb

## 2016-01-09 DIAGNOSIS — Z01419 Encounter for gynecological examination (general) (routine) without abnormal findings: Secondary | ICD-10-CM

## 2016-01-09 NOTE — Progress Notes (Signed)
Patient ID: Jessica [name], female   DOB: 08/22/1950, 65 y.o.   MRN: [id number] 65 y.o. G1P1 Married Caucasian female here for annual exam.    Had an MRI at [facility], and this showed a cyst on her right ovary.  Had a pelvic ultrasound in follow up which showed a mostly simple cyst of the right ovary. 2.4 cm. Left ovary normal, uterus normal with EMS 3 mm, no free fluid.   Pelvic U/S 10/16/15 -   Uterus - no masses.  EMS - 1.81 mm. Ovaries - right ovary with 16 mm simple cyst, avascular, smooth borders. Left ovary normal.  Free fluid - no CA125 - 8.  Denies any pelvic pain or bleeding.   Being treated for multiple myeloma.  Had brain surgery August 23, 2014.  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 Had radiation and chemotherapy.   Husband just had triple bypass surgery.  He is doing well.   PCP:   R.Marcellus Scott, MD  Patient's last menstrual period was 08/10/2003.           Sexually active: No. female The current method of family planning is vasectomy/Postmenopausal.    Exercising: No.   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 MMG:  03-18-14 Density B/Neg/BiRads1:[facility] Colonoscopy:  07/2011 normal with Dr. Sheliah Mends due 07/2016. BMD:   ?2010  Result  normal TDaP:  Up to date with PCP Gardasil:   N/A   Screening Labs:  Hb today: Oncology, Urine today: PCP   reports that she has never smoked. She has never used smokeless tobacco. She reports that she does not drink alcohol or use illicit drugs.  Past Medical History  Diagnosis Date  . MVP (mitral valve prolapse)     req prophylaxis  . Other constipation 12/03/2014  . Upper respiratory infection, acute 08/12/2015  . Multiple myeloma ([place]) 11/19/2014  . Multiple myeloma ([place])   . Multiple myeloma ([place])   . H/O stem cell transplant [facility]) 03/2015    [facility]  . Right ovarian cyst 10/16/15    16 mm simple cyst -  ultrasound yearly.    Past Surgical History  Procedure Laterality Date  . Laser ablation of condylomas  1990    CIN 1 cervix  . Cataract extraction, bilateral  10/2010    Implants(ReSTOR) Bilat  . Multiple myeloma surgery Right 08/2014    [facility]    Current Outpatient Prescriptions  Medication Sig Dispense Refill  . acyclovir (ZOVIRAX) 800 MG tablet Take 800 mg by mouth 2 (two) times daily.  1  . aspirin EC 81 MG tablet Take 81 mg by mouth daily with breakfast.    . fluticasone (FLONASE) 50 MCG/ACT nasal spray Place 1 spray into both nostrils 2 (two) times daily. 16 g 2  . gabapentin (NEURONTIN) 300 MG capsule take 1 capsule by mouth at bedtime 60 capsule 0  . HYDROcodone-homatropine (HYCODAN) 5-1.5 MG/5ML syrup Take 5 mLs by mouth every 6 (six) hours as needed for cough. 180 mL 0  . lenalidomide (REVLIMID) 10 MG capsule Take 1 capsule (10 mg total) by mouth daily. 28 capsule 0  . Multiple Vitamin (MULTIVITAMIN) tablet Take 1 tablet by mouth daily.    Marland Kitchen omeprazole (PRILOSEC) 20 MG capsule Take 1 capsule by mouth daily.    . ondansetron (ZOFRAN) 8 MG tablet Take 1 tablet (8 mg total) by mouth every  8 (eight) hours as needed for nausea or vomiting. 60 tablet 3  . prochlorperazine (COMPAZINE) 10 MG tablet Take 1 tablet by mouth 3 (three) times daily.    Marland Kitchen senna-docusate (SENOKOT-S) 8.6-50 MG tablet Take 2 tablets by mouth once as needed.     No current facility-administered medications for this visit.    Family History  Problem Relation Age of Onset  . Osteoporosis Mother   . Diabetes Father     ROS:  Pertinent items are noted in HPI.  Otherwise, a comprehensive ROS was negative.  Exam:   BP 120/76 mmHg  Pulse 80  Resp 22  Ht 5' 0.25" (1.53 m)  Wt 114 lb (51.71 kg)  BMI 22.09 kg/m2  LMP 08/10/2003    General appearance: alert, cooperative and appears stated age Head: Normocephalic, without obvious abnormality, atraumatic Neck: no adenopathy, supple, symmetrical,  trachea midline and thyroid normal to inspection and palpation Lungs: clear to auscultation bilaterally Breasts: normal appearance, no masses or tenderness, Inspection negative, No nipple retraction or dimpling, No nipple discharge or bleeding, No axillary or supraclavicular adenopathy Heart: regular rate and rhythm Abdomen: incisions:  No.    , soft, non-tender; no masses, no organomegaly Extremities: extremities normal, atraumatic, no cyanosis or edema Skin: Skin color, texture, turgor normal. No rashes or lesions Lymph nodes: Cervical, supraclavicular, and axillary nodes normal. No abnormal inguinal nodes palpated Neurologic: Grossly normal  Pelvic: External genitalia:  no lesions              Urethra:  normal appearing urethra with no masses, tenderness or lesions              Bartholins and Skenes: normal                 Vagina: normal appearing vagina with normal color and discharge, no lesions              Cervix: no lesions.  Consistent with prior LEEP.              Pap taken: Yes.    Pap only. Bimanual Exam:  Uterus:  normal size, contour, position, consistency, mobility, non-tender              Adnexa: normal adnexa and no mass, fullness, tenderness              Rectal exam: Yes.  .  Confirms.              Anus:  normal sphincter tone, no lesions  Chaperone was present for exam.  Assessment:   Well woman visit with normal exam. 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  Simple right ovarian cyst.  Multiple myeloma.  Plan: Yearly mammogram recommended after age 39.  Patient will call [facility] to schedule.  Recommended self breast exam.  Pap and HR HPV as above. Discussed Calcium, Vitamin D, regular exercise program including cardiovascular and weight bearing exercise. Labs performed.  No..   Prescription medication(s) given.  No..  Yearly pelvic ultrasound to check ovarian cyst.   Follow up annually and prn.       After visit summary provided.

## 2016-01-09 NOTE — Progress Notes (Signed)
Per [facility] revlimid shipped via [facility] 01/08/16

## 2016-01-09 NOTE — Patient Instructions (Signed)
Health Maintenance, Female Adopting a healthy lifestyle and getting preventive care can go a long way to promote health and wellness. Talk with your health care provider about what schedule of regular examinations is right for you. This is a good chance for you to check in with your provider about disease prevention and staying healthy. In between checkups, there are plenty of things you can do on your own. Experts have done a lot of research about which lifestyle changes and preventive measures are most likely to keep you healthy. Ask your health care provider for more information. WEIGHT AND DIET  Eat a healthy diet  Be sure to include plenty of vegetables, fruits, low-fat dairy products, and lean protein.  Do not eat a lot of foods high in solid fats, added sugars, or salt.  Get regular exercise. This is one of the most important things you can do for your health.  Most adults should exercise for at least 150 minutes each week. The exercise should increase your heart rate and make you sweat (moderate-intensity exercise).  Most adults should also do strengthening exercises at least twice a week. This is in addition to the moderate-intensity exercise.  Maintain a healthy weight  Body mass index (BMI) is a measurement that can be used to identify possible weight problems. It estimates body fat based on height and weight. Your health care provider can help determine your BMI and help you achieve or maintain a healthy weight.  For females 20 years of age and older:   A BMI below 18.5 is considered underweight.  A BMI of 18.5 to 24.9 is normal.  A BMI of 25 to 29.9 is considered overweight.  A BMI of 30 and above is considered obese.  Watch levels of cholesterol and blood lipids  You should start having your blood tested for lipids and cholesterol at 66 years of age, then have this test every 5 years.  You may need to have your cholesterol levels checked more often if:  Your lipid  or cholesterol levels are high.  You are older than 66 years of age.  You are at high risk for heart disease.  CANCER SCREENING   Lung Cancer  Lung cancer screening is recommended for adults 55-80 years old who are at high risk for lung cancer because of a history of smoking.  A yearly low-dose CT scan of the lungs is recommended for people who:  Currently smoke.  Have quit within the past 15 years.  Have at least a 30-pack-year history of smoking. A pack year is smoking an average of one pack of cigarettes a day for 1 year.  Yearly screening should continue until it has been 15 years since you quit.  Yearly screening should stop if you develop a health problem that would prevent you from having lung cancer treatment.  Breast Cancer  Practice breast self-awareness. This means understanding how your breasts normally appear and feel.  It also means doing regular breast self-exams. Let your health care provider know about any changes, no matter how small.  If you are in your 20s or 30s, you should have a clinical breast exam (CBE) by a health care provider every 1-3 years as part of a regular health exam.  If you are 40 or older, have a CBE every year. Also consider having a breast X-ray (mammogram) every year.  If you have a family history of breast cancer, talk to your health care provider about genetic screening.  If you   are at high risk for breast cancer, talk to your health care provider about having an MRI and a mammogram every year.  Breast cancer gene (BRCA) assessment is recommended for women who have family members with BRCA-related cancers. BRCA-related cancers include:  Breast.  Ovarian.  Tubal.  Peritoneal cancers.  Results of the assessment will determine the need for genetic counseling and BRCA1 and BRCA2 testing. Cervical Cancer Your health care provider may recommend that you be screened regularly for cancer of the pelvic organs (ovaries, uterus, and  vagina). This screening involves a pelvic examination, including checking for microscopic changes to the surface of your cervix (Pap test). You may be encouraged to have this screening done every 3 years, beginning at age 21.  For women ages 30-65, health care providers may recommend pelvic exams and Pap testing every 3 years, or they may recommend the Pap and pelvic exam, combined with testing for human papilloma virus (HPV), every 5 years. Some types of HPV increase your risk of cervical cancer. Testing for HPV may also be done on women of any age with unclear Pap test results.  Other health care providers may not recommend any screening for nonpregnant women who are considered low risk for pelvic cancer and who do not have symptoms. Ask your health care provider if a screening pelvic exam is right for you.  If you have had past treatment for cervical cancer or a condition that could lead to cancer, you need Pap tests and screening for cancer for at least 20 years after your treatment. If Pap tests have been discontinued, your risk factors (such as having a new sexual partner) need to be reassessed to determine if screening should resume. Some women have medical problems that increase the chance of getting cervical cancer. In these cases, your health care provider may recommend more frequent screening and Pap tests. Colorectal Cancer  This type of cancer can be detected and often prevented.  Routine colorectal cancer screening usually begins at 66 years of age and continues through 66 years of age.  Your health care provider may recommend screening at an earlier age if you have risk factors for colon cancer.  Your health care provider may also recommend using home test kits to check for hidden blood in the stool.  A small camera at the end of a tube can be used to examine your colon directly (sigmoidoscopy or colonoscopy). This is done to check for the earliest forms of colorectal  cancer.  Routine screening usually begins at age 50.  Direct examination of the colon should be repeated every 5-10 years through 66 years of age. However, you may need to be screened more often if early forms of precancerous polyps or small growths are found. Skin Cancer  Check your skin from head to toe regularly.  Tell your health care provider about any new moles or changes in moles, especially if there is a change in a mole's shape or color.  Also tell your health care provider if you have a mole that is larger than the size of a pencil eraser.  Always use sunscreen. Apply sunscreen liberally and repeatedly throughout the day.  Protect yourself by wearing long sleeves, pants, a wide-brimmed hat, and sunglasses whenever you are outside. HEART DISEASE, DIABETES, AND HIGH BLOOD PRESSURE   High blood pressure causes heart disease and increases the risk of stroke. High blood pressure is more likely to develop in:  People who have blood pressure in the high end   of the normal range (130-139/85-89 mm Hg).  People who are overweight or obese.  People who are African American.  If you are 38-23 years of age, have your blood pressure checked every 3-5 years. If you are 61 years of age or older, have your blood pressure checked every year. You should have your blood pressure measured twice--once when you are at a hospital or clinic, and once when you are not at a hospital or clinic. Record the average of the two measurements. To check your blood pressure when you are not at a hospital or clinic, you can use:  An automated blood pressure machine at a pharmacy.  A home blood pressure monitor.  If you are between 45 years and 39 years old, ask your health care provider if you should take aspirin to prevent strokes.  Have regular diabetes screenings. This involves taking a blood sample to check your fasting blood sugar level.  If you are at a normal weight and have a low risk for diabetes,  have this test once every three years after 66 years of age.  If you are overweight and have a high risk for diabetes, consider being tested at a younger age or more often. PREVENTING INFECTION  Hepatitis B  If you have a higher risk for hepatitis B, you should be screened for this virus. You are considered at high risk for hepatitis B if:  You were born in a country where hepatitis B is common. Ask your health care provider which countries are considered high risk.  Your parents were born in a high-risk country, and you have not been immunized against hepatitis B (hepatitis B vaccine).  You have HIV or AIDS.  You use needles to inject street drugs.  You live with someone who has hepatitis B.  You have had sex with someone who has hepatitis B.  You get hemodialysis treatment.  You take certain medicines for conditions, including cancer, organ transplantation, and autoimmune conditions. Hepatitis C  Blood testing is recommended for:  Everyone born from [birth year] through 1966.  Anyone with known risk factors for hepatitis C. Sexually transmitted infections (STIs)  You should be screened for sexually transmitted infections (STIs) including gonorrhea and chlamydia if:  You are sexually active and are younger than 66 years of age.  You are older than 66 years of age and your health care provider tells you that you are at risk for this type of infection.  Your sexual activity has changed since you were last screened and you are at an increased risk for chlamydia or gonorrhea. Ask your health care provider if you are at risk.  If you do not have HIV, but are at risk, it may be recommended that you take a prescription medicine daily to prevent HIV infection. This is called pre-exposure prophylaxis (PrEP). You are considered at risk if:  You are sexually active and do not regularly use condoms or know the HIV status of your partner(s).  You take drugs by injection.  You are sexually  active with a partner who has HIV. Talk with your health care provider about whether you are at high risk of being infected with HIV. If you choose to begin PrEP, you should first be tested for HIV. You should then be tested every 3 months for as long as you are taking PrEP.  PREGNANCY   If you are premenopausal and you may become pregnant, ask your health care provider about preconception counseling.  If you may  become pregnant, take 400 to 800 micrograms (mcg) of folic acid every day.  If you want to prevent pregnancy, talk to your health care provider about birth control (contraception). OSTEOPOROSIS AND MENOPAUSE   Osteoporosis is a disease in which the bones lose minerals and strength with aging. This can result in serious bone fractures. Your risk for osteoporosis can be identified using a bone density scan.  If you are 61 years of age or older, or if you are at risk for osteoporosis and fractures, ask your health care provider if you should be screened.  Ask your health care provider whether you should take a calcium or vitamin D supplement to lower your risk for osteoporosis.  Menopause may have certain physical symptoms and risks.  Hormone replacement therapy may reduce some of these symptoms and risks. Talk to your health care provider about whether hormone replacement therapy is right for you.  HOME CARE INSTRUCTIONS   Schedule regular health, dental, and eye exams.  Stay current with your immunizations.   Do not use any tobacco products including cigarettes, chewing tobacco, or electronic cigarettes.  If you are pregnant, do not drink alcohol.  If you are breastfeeding, limit how much and how often you drink alcohol.  Limit alcohol intake to no more than 1 drink per day for nonpregnant women. One drink equals 12 ounces of beer, 5 ounces of wine, or 1 ounces of hard liquor.  Do not use street drugs.  Do not share needles.  Ask your health care provider for help if  you need support or information about quitting drugs.  Tell your health care provider if you often feel depressed.  Tell your health care provider if you have ever been abused or do not feel safe at home.   This information is not intended to replace advice given to you by your health care provider. Make sure you discuss any questions you have with your health care provider.   Document Released: 02/08/2011 Document Revised: 08/16/2014 Document Reviewed: 06/27/2013 Elsevier Interactive Patient Education [employer].

## 2016-01-12 LAB — IPS PAP SMEAR ONLY

## 2016-01-15 ENCOUNTER — Other Ambulatory Visit: Payer: Self-pay | Admitting: Hematology and Oncology

## 2016-01-16 ENCOUNTER — Telehealth: Payer: Self-pay | Admitting: Hematology and Oncology

## 2016-01-16 ENCOUNTER — Ambulatory Visit (HOSPITAL_BASED_OUTPATIENT_CLINIC_OR_DEPARTMENT_OTHER): Payer: Medicare Other

## 2016-01-16 ENCOUNTER — Other Ambulatory Visit (HOSPITAL_BASED_OUTPATIENT_CLINIC_OR_DEPARTMENT_OTHER): Payer: Medicare Other

## 2016-01-16 ENCOUNTER — Encounter: Payer: Self-pay | Admitting: Hematology and Oncology

## 2016-01-16 ENCOUNTER — Ambulatory Visit (HOSPITAL_BASED_OUTPATIENT_CLINIC_OR_DEPARTMENT_OTHER): Payer: Medicare Other | Admitting: Hematology and Oncology

## 2016-01-16 VITALS — BP 111/57 | HR 81 | Temp 99.0°F | Resp 18 | Ht 60.25 in | Wt 113.1 lb

## 2016-01-16 DIAGNOSIS — E876 Hypokalemia: Secondary | ICD-10-CM | POA: Insufficient documentation

## 2016-01-16 DIAGNOSIS — D630 Anemia in neoplastic disease: Secondary | ICD-10-CM | POA: Diagnosis not present

## 2016-01-16 DIAGNOSIS — C9001 Multiple myeloma in remission: Secondary | ICD-10-CM

## 2016-01-16 DIAGNOSIS — Z9481 Bone marrow transplant status: Secondary | ICD-10-CM

## 2016-01-16 DIAGNOSIS — R0981 Nasal congestion: Secondary | ICD-10-CM

## 2016-01-16 LAB — CBC WITH DIFFERENTIAL/PLATELET
BASO%: 0.5 % (ref 0.0–2.0)
Basophils Absolute: 0 10*3/uL (ref 0.0–0.1)
EOS%: 17.4 % — ABNORMAL HIGH (ref 0.0–7.0)
Eosinophils Absolute: 0.7 10*3/uL — ABNORMAL HIGH (ref 0.0–0.5)
HCT: 28.2 % — ABNORMAL LOW (ref 34.8–46.6)
HGB: 9.1 g/dL — ABNORMAL LOW (ref 11.6–15.9)
LYMPH%: 21.3 % (ref 14.0–49.7)
MCH: 28.7 pg (ref 25.1–34.0)
MCHC: 32.3 g/dL (ref 31.5–36.0)
MCV: 89 fL (ref 79.5–101.0)
MONO#: 0.3 10*3/uL (ref 0.1–0.9)
MONO%: 8 % (ref 0.0–14.0)
NEUT#: 2.2 10*3/uL (ref 1.5–6.5)
NEUT%: 52.8 % (ref 38.4–76.8)
Platelets: 233 10*3/uL (ref 145–400)
RBC: 3.17 10*6/uL — ABNORMAL LOW (ref 3.70–5.45)
RDW: 14.6 % — ABNORMAL HIGH (ref 11.2–14.5)
WBC: 4.1 10*3/uL (ref 3.9–10.3)
lymph#: 0.9 10*3/uL (ref 0.9–3.3)

## 2016-01-16 LAB — COMPREHENSIVE METABOLIC PANEL
ALT: 13 U/L (ref 0–55)
AST: 12 U/L (ref 5–34)
Albumin: 3 g/dL — ABNORMAL LOW (ref 3.5–5.0)
Alkaline Phosphatase: 118 U/L (ref 40–150)
Anion Gap: 10 mEq/L (ref 3–11)
BUN: 12 mg/dL (ref 7.0–26.0)
CO2: 24 mEq/L (ref 22–29)
Calcium: 8.7 mg/dL (ref 8.4–10.4)
Chloride: 106 mEq/L (ref 98–109)
Creatinine: 1.1 mg/dL (ref 0.6–1.1)
EGFR: 52 mL/min/{1.73_m2} — ABNORMAL LOW (ref >90)
Glucose: 75 mg/dl (ref 70–140)
Potassium: 2.9 mEq/L — CL (ref 3.5–5.1)
Sodium: 141 mEq/L (ref 136–145)
Total Bilirubin: 0.44 mg/dL (ref 0.20–1.20)
Total Protein: 6.5 g/dL (ref 6.4–8.3)

## 2016-01-16 MED ORDER — ZOLEDRONIC ACID 4 MG/5ML IV CONC
3.0000 mg | Freq: Once | INTRAVENOUS | Status: AC
  Administered 2016-01-16: 3 mg via INTRAVENOUS
  Filled 2016-01-16: qty 3.75

## 2016-01-16 NOTE — Assessment & Plan Note (Signed)
The patient is doing well s/p bone marrow transplant. She will continue antimicrobials prophylaxis as directed by [facility]. She had received recent vaccination schedule at [facility] Since she will be returning there within the next 3 months to [facility] for her one-year post transplant evaluation, I would not order the next set of myeloma panel.

## 2016-01-16 NOTE — Telephone Encounter (Signed)
Gave and pritned appt sched and avs fo rpt for July thru Sept

## 2016-01-16 NOTE — Progress Notes (Signed)
[place] OFFICE PROGRESS NOTE  Patient Care Team: Josetta Huddle, MD as PCP - General (Internal Medicine) Ginette Pitman, MD as Consulting Physician (Hematology and Oncology) Hessie Dibble, MD as Consulting Physician (Hematology and Oncology)  SUMMARY OF ONCOLOGIC HISTORY: Oncology History    M-protein 0.69 gm/dl IFIX - IgG, Kappa IgG - 868 IgA - 19 IgM - < 20 Kappa - 21 Lambda - 5.7  09/06/2014 - Bone marrow aspirate and biopsy:   Normocellular marrow for age (40%) with a small monoclonal plasma cell population (1% on aspirate). Karyotype 11, XX  FISH Negative for myeloma associated changes  09/12/2014 - PET/CT  Two regions that are concerning for disease, one adjacent/involving the left ninth rib and one in the marrow of the right femur, in this patient with history of plasmacytoma.      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   04/01/2015 Bone Marrow Transplant She received melphalan chemotherapy on 03/31/2015 followed by autologous stem cell  transplant the day after   04/03/2015 - 04/18/2015 Hospital Admission The patient was admitted to the hospital at [place] for management related to complication from stem cell transplant. She had significant nausea requiring intravenous anti-emetics.   07/17/2015 -  Chemotherapy She started maintenance Revlimid and monthly zometa    INTERVAL HISTORY: Please see below for problem oriented charting. She returns for further follow-up.  She is doing well on Revlimid. She mild fatigue. She had recent diarrhea which she thinks was unrelated to treatment, resolved spontaneously. She continues to have chronic sinus congestion and nonproductive cough. No recent fevers or chills. Denies bone pain. She denies dental issue. She has appointment pending for routine checkup with her dentist.  REVIEW OF SYSTEMS:   Constitutional: Denies fevers, chills or abnormal weight loss Eyes: Denies blurriness of vision Ears, nose, mouth, throat, and face: Denies mucositis or sore throat Cardiovascular: Denies palpitation, chest discomfort or lower extremity swelling Skin: Denies abnormal skin rashes Lymphatics: Denies new lymphadenopathy or easy bruising Neurological:Denies numbness, tingling or new weaknesses Behavioral/Psych: Mood is stable, no new changes  All other systems were reviewed with the patient and are negative.  I have reviewed the past medical history, past surgical history, social history and family history with the patient and they are unchanged from previous note.  ALLERGIES:  is allergic to codeine; ibuprofen; nsaids; and augmentin.  MEDICATIONS:  Current Outpatient Prescriptions  Medication Sig Dispense Refill  . acyclovir (ZOVIRAX) 800 MG tablet Take 800 mg by mouth 2 (two) times daily.  1  .  aspirin EC 81 MG tablet Take 81 mg by mouth daily with breakfast.    . fluticasone (FLONASE) 50 MCG/ACT nasal spray Place 1 spray into both nostrils 2 (two) times daily. 16 g 2  . gabapentin  (NEURONTIN) 300 MG capsule take 1 capsule by mouth at bedtime 60 capsule 0  . lenalidomide (REVLIMID) 10 MG capsule Take 1 capsule (10 mg total) by mouth daily. 28 capsule 0  . Multiple Vitamin (MULTIVITAMIN) tablet Take 1 tablet by mouth daily.    Marland Kitchen omeprazole (PRILOSEC) 20 MG capsule Take 1 capsule by mouth daily.    . ondansetron (ZOFRAN) 8 MG tablet Take 1 tablet (8 mg total) by mouth every 8 (eight) hours as needed for nausea or vomiting. 60 tablet 3  . senna-docusate (SENOKOT-S) 8.6-50 MG tablet Take 2 tablets by mouth once as needed.     No current facility-administered medications for this visit.    PHYSICAL EXAMINATION: ECOG PERFORMANCE STATUS: 1 - Symptomatic but completely ambulatory  Filed Vitals:   01/16/16 1132  BP: 111/57  Pulse: 81  Temp: 99 F (37.2 C)  Resp: 18   Filed Weights   01/16/16 1132  Weight: 113 lb 1.6 oz (51.302 kg)    GENERAL:alert, no distress and comfortable SKIN: skin color is pale, texture, turgor are normal, no rashes or significant lesions EYES: normal, Conjunctiva are pale and non-injected, sclera clear OROPHARYNX:no exudate, no erythema and lips, buccal mucosa, and tongue normal  NECK: supple, thyroid normal size, non-tender, without nodularity LYMPH:  no palpable lymphadenopathy in the cervical, axillary or inguinal LUNGS: clear to auscultation and percussion with normal breathing effort HEART: regular rate & rhythm and no murmurs and no lower extremity edema ABDOMEN:abdomen soft, non-tender and normal bowel sounds Musculoskeletal:no cyanosis of digits and no clubbing  NEURO: alert & oriented x 3 with fluent speech, no focal motor/sensory deficits  LABORATORY DATA:  I have reviewed the data as listed    Component Value Date/Time   NA 141 01/16/2016 1118   NA 142 12/15/2014 1729   K 2.9* 01/16/2016 1118   K 2.6* 12/15/2014 1729   CL 108 12/15/2014 1729   CO2 24 01/16/2016 1118   CO2 25 12/15/2014 1729   GLUCOSE 75 01/16/2016 1118    GLUCOSE 81 12/15/2014 1729   BUN 12.0 01/16/2016 1118   BUN 5* 12/15/2014 1729   CREATININE 1.1 01/16/2016 1118   CREATININE 0.75 12/15/2014 1729   CALCIUM 8.7 01/16/2016 1118   CALCIUM 8.1* 12/15/2014 1729   PROT 6.5 01/16/2016 1118   PROT 6.2 12/19/2015 1050   ALBUMIN 3.0* 01/16/2016 1118   AST 12 01/16/2016 1118   ALT 13 01/16/2016 1118   ALKPHOS 118 01/16/2016 1118   BILITOT 0.44 01/16/2016 1118   GFRNONAA >60 12/15/2014 1729   GFRAA >60 12/15/2014 1729    No results found for: SPEP, UPEP  Lab Results  Component Value Date   WBC 4.1 01/16/2016   NEUTROABS 2.2 01/16/2016   HGB 9.1* 01/16/2016   HCT 28.2* 01/16/2016   MCV 89.0 01/16/2016   PLT 233 01/16/2016      Chemistry      Component Value Date/Time   NA 141 01/16/2016 1118   NA 142 12/15/2014 1729   K 2.9* 01/16/2016 1118   K 2.6* 12/15/2014 1729   CL 108 12/15/2014 1729   CO2 24 01/16/2016 1118   CO2 25 12/15/2014 1729   BUN 12.0 01/16/2016 1118   BUN 5* 12/15/2014 1729   CREATININE  1.1 01/16/2016 1118   CREATININE 0.75 12/15/2014 1729      Component Value Date/Time   CALCIUM 8.7 01/16/2016 1118   CALCIUM 8.1* 12/15/2014 1729   ALKPHOS 118 01/16/2016 1118   AST 12 01/16/2016 1118   ALT 13 01/16/2016 1118   BILITOT 0.44 01/16/2016 1118     ASSESSMENT & PLAN:  Multiple myeloma in remission ([place]) She is currently receiving Zometa and Revlimid She had mild reaction to Zometa previously with some bone pain and hives, but doing better with reduced dose at 3 mg. We will continue to same dose.  I hope to transition her to every 3 months treatment after her next bone marrow transplant evaluation at [facility] She will continue Revlimid. She will continue calcium and vitamin D supplements along with aspirin. Recent blood work suggested that she is in complete remission.  S/P bone marrow transplant [facility]) The patient is doing well s/p bone marrow transplant. She will continue  antimicrobials prophylaxis as directed by [facility]. She had received recent vaccination schedule at [facility] Since she will be returning there within the next 3 months to [facility] for her one-year post transplant evaluation, I would not order the next set of myeloma panel.      Anemia in neoplastic disease This is likely due to recent treatment. The patient denies recent history of bleeding such as epistaxis, hematuria or hematochezia. She is asymptomatic from the anemia. I will observe for now.  She does not require transfusion now. I will continue the chemotherapy at current dose without dosage adjustment.  If the anemia gets progressive worse in the future, I might have to delay her treatment or adjust the chemotherapy dose.     Nasal congestion This is chronic in nature. This could be the likely cause her runny nose and nonproductive cough She has mild eosinophilia in her blood work. I recommend over-the-counter antihistamines.   No orders of the defined types were placed in this encounter.   All questions were answered. The patient knows to call the clinic with any problems, questions or concerns. No barriers to learning was detected. I spent 15 minutes counseling the patient face to face. The total time spent in the appointment was 20 minutes and more than 50% was on counseling and review of test results     [facility], [place], MD 01/16/2016 12:11 PM

## 2016-01-16 NOTE — Assessment & Plan Note (Signed)

## 2016-01-16 NOTE — Assessment & Plan Note (Signed)
She has significant hypokalemia due to recent GI loss. She is not symptomatic. I advised potassium rich diet and potassium replacement therapy.

## 2016-01-16 NOTE — Patient Instructions (Signed)

## 2016-01-16 NOTE — Assessment & Plan Note (Signed)
She is currently receiving Zometa and Revlimid She had mild reaction to Zometa previously with some bone pain and hives, but doing better with reduced dose at 3 mg. We will continue to same dose.  I hope to transition her to every 3 months treatment after her next bone marrow transplant evaluation at [facility] She will continue Revlimid. She will continue calcium and vitamin D supplements along with aspirin. Recent blood work suggested that she is in complete remission.

## 2016-01-16 NOTE — Assessment & Plan Note (Signed)
This is chronic in nature. This could be the likely cause her runny nose and nonproductive cough She has mild eosinophilia in her blood work. I recommend over-the-counter antihistamines.

## 2016-01-20 ENCOUNTER — Other Ambulatory Visit: Payer: Self-pay | Admitting: Hematology and Oncology

## 2016-01-28 ENCOUNTER — Telehealth: Payer: Self-pay | Admitting: Hematology and Oncology

## 2016-01-28 NOTE — Telephone Encounter (Signed)
s.w. pt and advised on md visit moved to 8.4 per MD request....pt ok and aware

## 2016-02-02 ENCOUNTER — Other Ambulatory Visit: Payer: Self-pay | Admitting: *Deleted

## 2016-02-02 MED ORDER — LENALIDOMIDE 10 MG PO CAPS: 10.0000 mg | ORAL_CAPSULE | Freq: Every day | ORAL | Status: DC

## 2016-02-04 ENCOUNTER — Encounter: Payer: Self-pay | Admitting: Hematology and Oncology

## 2016-02-04 NOTE — Progress Notes (Signed)
Per biologics revlimid was shipped via fed exp 02/03/16

## 2016-02-13 ENCOUNTER — Other Ambulatory Visit (HOSPITAL_BASED_OUTPATIENT_CLINIC_OR_DEPARTMENT_OTHER): Payer: Medicare Other

## 2016-02-13 ENCOUNTER — Ambulatory Visit (HOSPITAL_BASED_OUTPATIENT_CLINIC_OR_DEPARTMENT_OTHER): Payer: Medicare Other

## 2016-02-13 VITALS — BP 117/59 | HR 63 | Temp 98.1°F | Resp 16

## 2016-02-13 DIAGNOSIS — C9001 Multiple myeloma in remission: Secondary | ICD-10-CM

## 2016-02-13 DIAGNOSIS — Z9481 Bone marrow transplant status: Secondary | ICD-10-CM

## 2016-02-13 LAB — CBC WITH DIFFERENTIAL/PLATELET
BASO%: 2.9 % — ABNORMAL HIGH (ref 0.0–2.0)
Basophils Absolute: 0.1 10*3/uL (ref 0.0–0.1)
EOS%: 19.1 % — ABNORMAL HIGH (ref 0.0–7.0)
Eosinophils Absolute: 0.9 10*3/uL — ABNORMAL HIGH (ref 0.0–0.5)
HCT: 27.5 % — ABNORMAL LOW (ref 34.8–46.6)
HGB: 8.7 g/dL — ABNORMAL LOW (ref 11.6–15.9)
LYMPH%: 22.1 % (ref 14.0–49.7)
MCH: 28.7 pg (ref 25.1–34.0)
MCHC: 31.6 g/dL (ref 31.5–36.0)
MCV: 90.7 fL (ref 79.5–101.0)
MONO#: 0.4 10*3/uL (ref 0.1–0.9)
MONO%: 7.5 % (ref 0.0–14.0)
NEUT#: 2.4 10*3/uL (ref 1.5–6.5)
NEUT%: 48.4 % (ref 38.4–76.8)
Platelets: 158 10*3/uL (ref 145–400)
RBC: 3.04 10*6/uL — ABNORMAL LOW (ref 3.70–5.45)
RDW: 18.1 % — ABNORMAL HIGH (ref 11.2–14.5)
WBC: 4.9 10*3/uL (ref 3.9–10.3)
lymph#: 1.1 10*3/uL (ref 0.9–3.3)

## 2016-02-13 LAB — COMPREHENSIVE METABOLIC PANEL
ALT: 13 U/L (ref 0–55)
AST: 16 U/L (ref 5–34)
Albumin: 3.3 g/dL — ABNORMAL LOW (ref 3.5–5.0)
Alkaline Phosphatase: 90 U/L (ref 40–150)
Anion Gap: 9 mEq/L (ref 3–11)
BUN: 12.4 mg/dL (ref 7.0–26.0)
CO2: 23 mEq/L (ref 22–29)
Calcium: 8.6 mg/dL (ref 8.4–10.4)
Chloride: 110 mEq/L — ABNORMAL HIGH (ref 98–109)
Creatinine: 1.1 mg/dL (ref 0.6–1.1)
EGFR: 55 mL/min/{1.73_m2} — ABNORMAL LOW (ref >90)
Glucose: 84 mg/dl (ref 70–140)
Potassium: 3.4 mEq/L — ABNORMAL LOW (ref 3.5–5.1)
Sodium: 142 mEq/L (ref 136–145)
Total Bilirubin: 0.49 mg/dL (ref 0.20–1.20)
Total Protein: 6.2 g/dL — ABNORMAL LOW (ref 6.4–8.3)

## 2016-02-13 MED ORDER — ZOLEDRONIC ACID 4 MG/5ML IV CONC
3.0000 mg | Freq: Once | INTRAVENOUS | Status: AC
  Administered 2016-02-13: 3 mg via INTRAVENOUS
  Filled 2016-02-13: qty 3.75

## 2016-02-13 NOTE — Patient Instructions (Signed)

## 2016-02-19 ENCOUNTER — Other Ambulatory Visit: Payer: Self-pay | Admitting: Hematology and Oncology

## 2016-02-25 ENCOUNTER — Other Ambulatory Visit: Payer: Self-pay | Admitting: *Deleted

## 2016-02-25 ENCOUNTER — Telehealth: Payer: Self-pay | Admitting: *Deleted

## 2016-02-25 MED ORDER — LENALIDOMIDE 10 MG PO CAPS: 10.0000 mg | ORAL_CAPSULE | Freq: Every day | ORAL | Status: DC

## 2016-02-25 NOTE — Telephone Encounter (Signed)
Cal from patient "refill needed on Revlimid.  I have eight pills left to last through next Thursday."  Could you tell Dr. Alvy Bimler nurse to re-order these.

## 2016-03-12 ENCOUNTER — Telehealth: Payer: Self-pay | Admitting: Hematology and Oncology

## 2016-03-12 ENCOUNTER — Ambulatory Visit (HOSPITAL_BASED_OUTPATIENT_CLINIC_OR_DEPARTMENT_OTHER): Payer: Medicare Other | Admitting: Hematology and Oncology

## 2016-03-12 ENCOUNTER — Encounter: Payer: Self-pay | Admitting: Hematology and Oncology

## 2016-03-12 ENCOUNTER — Ambulatory Visit: Payer: Medicare Other

## 2016-03-12 ENCOUNTER — Other Ambulatory Visit (HOSPITAL_BASED_OUTPATIENT_CLINIC_OR_DEPARTMENT_OTHER): Payer: Medicare Other

## 2016-03-12 ENCOUNTER — Ambulatory Visit (HOSPITAL_BASED_OUTPATIENT_CLINIC_OR_DEPARTMENT_OTHER): Payer: Medicare Other

## 2016-03-12 VITALS — BP 139/69 | HR 62 | Temp 98.2°F | Resp 17 | Wt 112.2 lb

## 2016-03-12 DIAGNOSIS — D61810 Antineoplastic chemotherapy induced pancytopenia: Secondary | ICD-10-CM | POA: Insufficient documentation

## 2016-03-12 DIAGNOSIS — C9001 Multiple myeloma in remission: Secondary | ICD-10-CM

## 2016-03-12 DIAGNOSIS — E876 Hypokalemia: Secondary | ICD-10-CM | POA: Diagnosis not present

## 2016-03-12 DIAGNOSIS — T451X5A Adverse effect of antineoplastic and immunosuppressive drugs, initial encounter: Secondary | ICD-10-CM

## 2016-03-12 DIAGNOSIS — N183 Chronic kidney disease, stage 3 unspecified: Secondary | ICD-10-CM

## 2016-03-12 DIAGNOSIS — Z9481 Bone marrow transplant status: Secondary | ICD-10-CM

## 2016-03-12 DIAGNOSIS — D61818 Other pancytopenia: Secondary | ICD-10-CM | POA: Diagnosis not present

## 2016-03-12 LAB — CBC WITH DIFFERENTIAL/PLATELET
BASO%: 0.6 % (ref 0.0–2.0)
Basophils Absolute: 0 10*3/uL (ref 0.0–0.1)
EOS%: 22.4 % — ABNORMAL HIGH (ref 0.0–7.0)
Eosinophils Absolute: 0.7 10*3/uL — ABNORMAL HIGH (ref 0.0–0.5)
HCT: 28.4 % — ABNORMAL LOW (ref 34.8–46.6)
HGB: 9.4 g/dL — ABNORMAL LOW (ref 11.6–15.9)
LYMPH%: 33.2 % (ref 14.0–49.7)
MCH: 30 pg (ref 25.1–34.0)
MCHC: 33.1 g/dL (ref 31.5–36.0)
MCV: 90.7 fL (ref 79.5–101.0)
MONO#: 0.1 10*3/uL (ref 0.1–0.9)
MONO%: 3 % (ref 0.0–14.0)
NEUT#: 1.4 10*3/uL — ABNORMAL LOW (ref 1.5–6.5)
NEUT%: 40.8 % (ref 38.4–76.8)
Platelets: 84 10*3/uL — ABNORMAL LOW (ref 145–400)
RBC: 3.13 10*6/uL — ABNORMAL LOW (ref 3.70–5.45)
RDW: 18.3 % — ABNORMAL HIGH (ref 11.2–14.5)
WBC: 3.3 10*3/uL — ABNORMAL LOW (ref 3.9–10.3)
lymph#: 1.1 10*3/uL (ref 0.9–3.3)
nRBC: 0 % (ref 0–0)

## 2016-03-12 LAB — COMPREHENSIVE METABOLIC PANEL
ALT: 16 U/L (ref 0–55)
AST: 13 U/L (ref 5–34)
Albumin: 3.7 g/dL (ref 3.5–5.0)
Alkaline Phosphatase: 94 U/L (ref 40–150)
Anion Gap: 11 mEq/L (ref 3–11)
BUN: 15 mg/dL (ref 7.0–26.0)
CO2: 21 mEq/L — ABNORMAL LOW (ref 22–29)
Calcium: 8.5 mg/dL (ref 8.4–10.4)
Chloride: 110 mEq/L — ABNORMAL HIGH (ref 98–109)
Creatinine: 1.2 mg/dL — ABNORMAL HIGH (ref 0.6–1.1)
EGFR: 47 mL/min/{1.73_m2} — ABNORMAL LOW (ref >90)
Glucose: 77 mg/dl (ref 70–140)
Potassium: 2.4 mEq/L — CL (ref 3.5–5.1)
Sodium: 141 mEq/L (ref 136–145)
Total Bilirubin: 0.72 mg/dL (ref 0.20–1.20)
Total Protein: 6.6 g/dL (ref 6.4–8.3)

## 2016-03-12 MED ORDER — SODIUM CHLORIDE 0.9 % IV SOLN
Freq: Once | INTRAVENOUS | Status: AC
  Administered 2016-03-12: 14:00:00 via INTRAVENOUS
  Filled 2016-03-12: qty 250

## 2016-03-12 MED ORDER — POTASSIUM CHLORIDE CRYS ER 20 MEQ PO TBCR: 40.0000 meq | EXTENDED_RELEASE_TABLET | Freq: Two times a day (BID) | ORAL | 3 refills | Status: DC

## 2016-03-12 MED ORDER — ZOLEDRONIC ACID 4 MG/5ML IV CONC
3.0000 mg | Freq: Once | INTRAVENOUS | Status: AC
  Administered 2016-03-12: 3 mg via INTRAVENOUS
  Filled 2016-03-12: qty 3.75

## 2016-03-12 MED ORDER — POTASSIUM CHLORIDE 10 MEQ/100ML IV SOLN: 10.0000 meq | INTRAVENOUS | Status: DC

## 2016-03-12 MED ORDER — SODIUM CHLORIDE 0.9 % IV SOLN
Freq: Once | INTRAVENOUS | Status: AC
  Administered 2016-03-12: 13:00:00 via INTRAVENOUS

## 2016-03-12 NOTE — Patient Instructions (Signed)
Zoledronic Acid injection (Hypercalcemia, Oncology)  What is this medicine?  ZOLEDRONIC ACID (ZOE le dron ik AS id) lowers the amount of calcium loss from bone. It is used to treat too much calcium in your blood from cancer. It is also used to prevent complications of cancer that has spread to the bone.  This medicine may be used for other purposes; ask your health care provider or pharmacist if you have questions.  What should I tell my health care provider before I take this medicine?  They need to know if you have any of these conditions:  -aspirin-sensitive asthma  -cancer, especially if you are receiving medicines used to treat cancer  -dental disease or wear dentures  -infection  -kidney disease  -receiving corticosteroids like dexamethasone or prednisone  -an unusual or allergic reaction to zoledronic acid, other medicines, foods, dyes, or preservatives  -pregnant or trying to get pregnant  -breast-feeding  How should I use this medicine?  This medicine is for infusion into a vein. It is given by a health care professional in a hospital or clinic setting.  Talk to your pediatrician regarding the use of this medicine in children. Special care may be needed.  Overdosage: If you think you have taken too much of this medicine contact a poison control center or emergency room at once.  NOTE: This medicine is only for you. Do not share this medicine with others.  What if I miss a dose?  It is important not to miss your dose. Call your doctor or health care professional if you are unable to keep an appointment.  What may interact with this medicine?  -certain antibiotics given by injection  -NSAIDs, medicines for pain and inflammation, like ibuprofen or naproxen  -some diuretics like bumetanide, furosemide  -teriparatide  -thalidomide  This list may not describe all possible interactions. Give your health care provider a list of all the medicines, herbs, non-prescription drugs, or dietary supplements you use. Also  tell them if you smoke, drink alcohol, or use illegal drugs. Some items may interact with your medicine.  What should I watch for while using this medicine?  Visit your doctor or health care professional for regular checkups. It may be some time before you see the benefit from this medicine. Do not stop taking your medicine unless your doctor tells you to. Your doctor may order blood tests or other tests to see how you are doing.  Women should inform their doctor if they wish to become pregnant or think they might be pregnant. There is a potential for serious side effects to an unborn child. Talk to your health care professional or pharmacist for more information.  You should make sure that you get enough calcium and vitamin D while you are taking this medicine. Discuss the foods you eat and the vitamins you take with your health care professional.  Some people who take this medicine have severe bone, joint, and/or muscle pain. This medicine may also increase your risk for jaw problems or a broken thigh bone. Tell your doctor right away if you have severe pain in your jaw, bones, joints, or muscles. Tell your doctor if you have any pain that does not go away or that gets worse.  Tell your dentist and dental surgeon that you are taking this medicine. You should not have major dental surgery while on this medicine. See your dentist to have a dental exam and fix any dental problems before starting this medicine. Take good care   your doctor or health care professional as soon as possible: -allergic reactions like skin rash, itching or hives, swelling of the face, lips, or tongue -anxiety, confusion, or depression -breathing problems -changes in vision -eye pain -feeling faint or lightheaded, falls -jaw pain,  especially after dental work -mouth sores -muscle cramps, stiffness, or weakness -redness, blistering, peeling or loosening of the skin, including inside the mouth -trouble passing urine or change in the amount of urine Side effects that usually do not require medical attention (report to your doctor or health care professional if they continue or are bothersome): -bone, joint, or muscle pain -constipation -diarrhea -fever -hair loss -irritation at site where injected -loss of appetite -nausea, vomiting -stomach upset -trouble sleeping -trouble swallowing -weak or tired This list may not describe all possible side effects. Call your doctor for medical advice about side effects. You may report side effects to FDA at 1-800-FDA-1088. Where should I keep my medicine? This drug is given in a hospital or clinic and will not be stored at home. NOTE: This sheet is a summary. It may not cover all possible information. If you have questions about this medicine, talk to your doctor, pharmacist, or health care provider.    2016, Elsevier/Gold Standard. (2013-12-22 14:19:39)   Hypokalemia Hypokalemia means that the amount of potassium in the blood is lower than normal.Potassium is a chemical, called an electrolyte, that helps regulate the amount of fluid in the body. It also stimulates muscle contraction and helps nerves function properly.Most of the body's potassium is inside of cells, and only a very small amount is in the blood. Because the amount in the blood is so small, minor changes can be life-threatening. CAUSES  Antibiotics.  Diarrhea or vomiting.  Using laxatives too much, which can cause diarrhea.  Chronic kidney disease.  Water pills (diuretics).  Eating disorders (bulimia).  Low magnesium level.  Sweating a lot. SIGNS AND SYMPTOMS  Weakness.  Constipation.  Fatigue.  Muscle cramps.  Mental confusion.  Skipped heartbeats or irregular heartbeat  (palpitations).  Tingling or numbness. DIAGNOSIS  Your health care provider can diagnose hypokalemia with blood tests. In addition to checking your potassium level, your health care provider may also check other lab tests. TREATMENT Hypokalemia can be treated with potassium supplements taken by mouth or adjustments in your current medicines. If your potassium level is very low, you may need to get potassium through a vein (IV) and be monitored in the hospital. A diet high in potassium is also helpful. Foods high in potassium are:  Nuts, such as peanuts and pistachios.  Seeds, such as sunflower seeds and pumpkin seeds.  Peas, lentils, and lima beans.  Whole grain and bran cereals and breads.  Fresh fruit and vegetables, such as apricots, avocado, bananas, cantaloupe, kiwi, oranges, tomatoes, asparagus, and potatoes.  Orange and tomato juices.  Red meats.  Fruit yogurt. HOME CARE INSTRUCTIONS  Take all medicines as prescribed by your health care provider.  Maintain a healthy diet by including nutritious food, such as fruits, vegetables, nuts, whole grains, and lean meats.  If you are taking a laxative, be sure to follow the directions on the label. SEEK MEDICAL CARE IF:  Your weakness gets worse.  You feel your heart pounding or racing.  You are vomiting or having diarrhea.  You are diabetic and having trouble keeping your blood glucose in the normal range. SEEK IMMEDIATE MEDICAL CARE IF:  You have chest pain, shortness of breath, or dizziness.  You are vomiting  or having diarrhea for more than 2 days.  You faint. MAKE SURE YOU:   Understand these instructions.  Will watch your condition.  Will get help right away if you are not doing well or get worse.   This information is not intended to replace advice given to you by your health care provider. Make sure you discuss any questions you have with your health care provider.   Document Released: 07/26/2005 Document  Revised: 08/16/2014 Document Reviewed: 01/26/2013 Elsevier Interactive Patient Education [employer].

## 2016-03-12 NOTE — Assessment & Plan Note (Signed)
She is noted to have significant pancytopenia. I will proceed with Zometa today but recommend she discontinue Revlimid treatment. I plan to repeat blood work next week for further assessment

## 2016-03-12 NOTE — Assessment & Plan Note (Signed)
This is due to recent treatment. She is not symptomatic, apart from minor bruising. I recommend she hold off her Revlimid treatment this week and recheck blood work next week

## 2016-03-12 NOTE — Assessment & Plan Note (Signed)
This is due to recent dehydration I recommend increase oral fluid intake and will reassess

## 2016-03-12 NOTE — Telephone Encounter (Signed)
Gv pt appt for 8/11 labs at 11.45pm.

## 2016-03-12 NOTE — Assessment & Plan Note (Signed)
She has severe hypokalemia due to poor oral intake and diarrhea. I will give her some potassium replacement therapy and recommend oral potassium replacement and recheck next week

## 2016-03-12 NOTE — Assessment & Plan Note (Signed)
The patient is doing well s/p bone marrow transplant. She will continue antimicrobials prophylaxis as directed by [facility]. She had received recent vaccination schedule at [facility] Since she will be returning there within the next 3 weeks to [facility] for her one-year post transplant evaluation, I would not order the next set of myeloma panel.

## 2016-03-12 NOTE — Progress Notes (Signed)
[name] OFF[facility] PROGRESS NOTE  Patient Care Team: Josetta Huddle, MD as PCP - General (Internal Medicine) Ginette Pitman, MD as Consulting Physician (Hematology and Oncology) Hessie Dibble, MD as Consulting Physician (Hematology and Oncology)  SUMMARY OF ONCOLOGIC HISTORY: Oncology History    M-protein 0.69 gm/dl IFIX - IgG, Kappa IgG - 868 IgA - 19 IgM - < 20 Kappa - 21 Lambda - 5.7  09/06/2014 - Bone marrow aspirate and biopsy:   Normocellular marrow for age (40%) with a small monoclonal plasma cell population (1% on aspirate). Karyotype 80, XX  FISH Negative for myeloma associated changes  09/12/2014 - PET/CT  Two regions that are concerning for disease, one adjacent/involving the left ninth rib and one in the marrow of the right femur, in this patient with history of plasmacytoma.      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     09/06/2014 Bone Marrow Biopsy    BM performed at [facility] is not consistent with multiple myeloma, 1% plasma cell on aspirate     09/12/2014 Imaging     PET CT scan show involvement of left ninth rib and right femur     09/23/2014 - 10/23/2014 Radiation Therapy     she had radiation therapy to the cavernous sinus and skull base lesions, 45 Gy     10/21/2014 - 11/01/2014 Radiation Therapy     she had radiation to right femur , total 30 Gy     11/26/2014 - 02/14/2015 Chemotherapy     she is started on weekly dexamethasone, Velcade twice a week on day 1, 4, 8 and 11 and Revlimid days 1-14.     04/01/2015 Bone Marrow Transplant    She received melphalan  chemotherapy on 03/31/2015 followed by autologous stem cell transplant the day after     04/03/2015 - 04/18/2015 Hospital Admission    The patient was admitted to the hospital at [place] for management related to complication from stem cell transplant. She had significant nausea requiring intravenous anti-emetics.     07/17/2015 -  Chemotherapy    She started maintenance Revlimid and monthly zometa      INTERVAL HISTORY: Please see below for problem oriented charting. She returns for further follow-up. She complained of recent bruising. She has poor oral intake with recent diarrhea. She complained of mild weakness. Denies recent infection. The patient denies any recent signs or symptoms of bleeding such as spontaneous epistaxis, hematuria or hematochezia.  REVIEW OF SYSTEMS:   Constitutional: Denies fevers, chills or abnormal weight loss Eyes: Denies blurriness of vision Ears, nose, mouth, throat, and face: Denies mucositis or sore throat Respiratory: Denies cough, dyspnea or wheezes Cardiovascular: Denies palpitation, chest discomfort or lower extremity swelling Skin: Noted skin bruising Lymphatics: Denies new lymphadenopathy or easy bruising Neurological:Denies numbness, tingling  Behavioral/Psych: Mood is stable, no new changes  All other systems were reviewed with the patient and are negative.  I have reviewed the past medical history, past surgical history, social history and family history with the patient and they are unchanged from previous note.  ALLERGIES:  is  allergic to albuterol; codeine; ibuprofen; nsaids; and augmentin [amoxicillin-pot clavulanate].  MEDICATIONS:  Current Outpatient Prescriptions  Medication Sig Dispense Refill  . acyclovir (ZOVIRAX) 800 MG tablet Take 800 mg by mouth 2 (two) times daily.  1  . aspirin EC 81 MG tablet Take 81 mg by mouth daily with breakfast.    . calcium carbonate (OSCAL) 1500 (600 Ca) MG TABS tablet Take 1,500 mg by mouth  daily with breakfast.    . fluticasone (FLONASE) 50 MCG/ACT nasal spray Place 1 spray into both nostrils 2 (two) times daily. 16 g 2  . gabapentin (NEURONTIN) 300 MG capsule take 1 capsule by mouth at bedtime 60 capsule 0  . lenalidomide (REVLIMID) 10 MG capsule Take 1 capsule (10 mg total) by mouth daily. 28 capsule 0  . Multiple Vitamin (MULTIVITAMIN) tablet Take 1 tablet by mouth daily.    . Multiple Vitamins-Minerals (CENTRUM SILVER PO) Take by mouth.    Marland Kitchen omeprazole (PRILOSEC) 20 MG capsule Take 1 capsule by mouth daily.    Marland Kitchen omeprazole (PRILOSEC) 20 MG capsule take 1 capsule by mouth once daily 30 capsule 6  . ondansetron (ZOFRAN) 8 MG tablet Take 1 tablet (8 mg total) by mouth every 8 (eight) hours as needed for nausea or vomiting. 60 tablet 3  . potassium chloride SA (K-DUR,KLOR-CON) 20 MEQ tablet Take 2 tablets (40 mEq total) by mouth 2 (two) times daily. 20 tablet 3  . senna-docusate (SENOKOT-S) 8.6-50 MG tablet Take 2 tablets by mouth once as needed.     No current facility-administered medications for this visit.     PHYSICAL EXAMINATION: ECOG PERFORMANCE STATUS: 1 - Symptomatic but completely ambulatory  Vitals:   03/12/16 1138  BP: 139/69  Pulse: 62  Resp: 17  Temp: 98.2 F (36.8 C)   Filed Weights   03/12/16 1138  Weight: 112 lb 3.2 oz (50.9 kg)    GENERAL:alert, no distress and comfortable SKIN: noted skin bruising EYES: normal, Conjunctiva are pink and non-injected, sclera clear OROPHARYNX:no exudate, no erythema and lips, buccal mucosa, and tongue normal  NECK: supple, thyroid normal size, non-tender, without nodularity LYMPH:  no palpable lymphadenopathy in the cervical, axillary or inguinal LUNGS: clear to auscultation and percussion with normal breathing effort HEART: regular rate & rhythm and no murmurs and no lower extremity edema ABDOMEN:abdomen soft, non-tender and normal bowel sounds Musculoskeletal:no cyanosis of digits and no clubbing  NEURO: alert  & oriented x 3 with fluent speech, no focal motor/sensory deficits  LABORATORY DATA:  I have reviewed the data as listed    Component Value Date/Time   NA 141 03/12/2016 1125   K 2.4 (LL) 03/12/2016 1125   CL 108 12/15/2014 1729   CO2 21 (L) 03/12/2016 1125   GLUCOSE 77 03/12/2016 1125   BUN 15.0 03/12/2016 1125   CREATININE 1.2 (H) 03/12/2016 1125   CALCIUM 8.5 03/12/2016 1125   PROT 6.6 03/12/2016 1125   ALBUMIN 3.7 03/12/2016 1125   AST 13 03/12/2016 1125   ALT 16 03/12/2016 1125   ALKPHOS 94 03/12/2016 1125   BILITOT 0.72 03/12/2016 1125   GFRNONAA >60 12/15/2014 1729   GFRAA >60 12/15/2014 1729    No results found for: SPEP, UPEP  Lab Results  Component Value Date   WBC 3.3 (L) 03/12/2016   NEUTROABS 1.4 (L) 03/12/2016   HGB 9.4 (L) 03/12/2016   HCT 28.4 (L) 03/12/2016   MCV 90.7 03/12/2016   PLT 84 (L) 03/12/2016      Chemistry  Component Value Date/Time   NA 141 03/12/2016 1125   K 2.4 (LL) 03/12/2016 1125   CL 108 12/15/2014 1729   CO2 21 (L) 03/12/2016 1125   BUN 15.0 03/12/2016 1125   CREATININE 1.2 (H) 03/12/2016 1125      Component Value Date/Time   CALCIUM 8.5 03/12/2016 1125   ALKPHOS 94 03/12/2016 1125   AST 13 03/12/2016 1125   ALT 16 03/12/2016 1125   BILITOT 0.72 03/12/2016 1125      ASSESSMENT & PLAN:  Multiple myeloma in remission ([place]) She is noted to have significant pancytopenia. I will proceed with Zometa today but recommend she discontinue Revlimid treatment. I plan to repeat blood work next week for further assessment  S/P bone marrow transplant [facility]) The patient is doing well s/p bone marrow transplant. She will continue antimicrobials prophylaxis as directed by [facility]. She had received recent vaccination schedule at [facility] Since she will be returning there within the next 3 weeks to [facility] for her one-year post transplant evaluation, I would not order the next set of myeloma  panel.      Pancytopenia due to antineoplastic chemotherapy [facility]) This is due to recent treatment. She is not symptomatic, apart from minor bruising. I recommend she hold off her Revlimid treatment this week and recheck blood work next week  Hypokalemia, gastrointestinal losses She has severe hypokalemia due to poor oral intake and diarrhea. I will give her some potassium replacement therapy and recommend oral potassium replacement and recheck next week  Chronic kidney disease, stage III (moderate) This is due to recent dehydration I recommend increase oral fluid intake and will reassess   No orders of the defined types were placed in this encounter.  All questions were answered. The patient knows to call the clinic with any problems, questions or concerns. No barriers to learning was detected. I spent 25 minutes counseling the patient face to face. The total time spent in the appointment was 30 minutes and more than 50% was on counseling and review of test results     [facility] [place], MD 03/12/2016 6:55 PM

## 2016-03-19 ENCOUNTER — Telehealth: Payer: Self-pay | Admitting: Hematology and Oncology

## 2016-03-19 ENCOUNTER — Other Ambulatory Visit (HOSPITAL_BASED_OUTPATIENT_CLINIC_OR_DEPARTMENT_OTHER): Payer: Medicare Other

## 2016-03-19 DIAGNOSIS — C9001 Multiple myeloma in remission: Secondary | ICD-10-CM

## 2016-03-19 LAB — CBC WITH DIFFERENTIAL/PLATELET
BASO%: 3.4 % — ABNORMAL HIGH (ref 0.0–2.0)
Basophils Absolute: 0.1 10*3/uL (ref 0.0–0.1)
EOS%: 13.6 % — ABNORMAL HIGH (ref 0.0–7.0)
Eosinophils Absolute: 0.4 10*3/uL (ref 0.0–0.5)
HCT: 26.7 % — ABNORMAL LOW (ref 34.8–46.6)
HGB: 8.7 g/dL — ABNORMAL LOW (ref 11.6–15.9)
LYMPH%: 39.8 % (ref 14.0–49.7)
MCH: 30.2 pg (ref 25.1–34.0)
MCHC: 32.6 g/dL (ref 31.5–36.0)
MCV: 92.7 fL (ref 79.5–101.0)
MONO#: 0.2 10*3/uL (ref 0.1–0.9)
MONO%: 8.3 % (ref 0.0–14.0)
NEUT#: 0.9 10*3/uL — ABNORMAL LOW (ref 1.5–6.5)
NEUT%: 34.9 % — ABNORMAL LOW (ref 38.4–76.8)
Platelets: 115 10*3/uL — ABNORMAL LOW (ref 145–400)
RBC: 2.88 10*6/uL — ABNORMAL LOW (ref 3.70–5.45)
RDW: 19.1 % — ABNORMAL HIGH (ref 11.2–14.5)
WBC: 2.6 10*3/uL — ABNORMAL LOW (ref 3.9–10.3)
lymph#: 1.1 10*3/uL (ref 0.9–3.3)

## 2016-03-19 LAB — COMPREHENSIVE METABOLIC PANEL
ALT: 15 U/L (ref 0–55)
AST: 16 U/L (ref 5–34)
Albumin: 3.6 g/dL (ref 3.5–5.0)
Alkaline Phosphatase: 92 U/L (ref 40–150)
Anion Gap: 6 mEq/L (ref 3–11)
BUN: 12.9 mg/dL (ref 7.0–26.0)
CO2: 19 mEq/L — ABNORMAL LOW (ref 22–29)
Calcium: 9 mg/dL (ref 8.4–10.4)
Chloride: 113 mEq/L — ABNORMAL HIGH (ref 98–109)
Creatinine: 1.3 mg/dL — ABNORMAL HIGH (ref 0.6–1.1)
EGFR: 42 mL/min/{1.73_m2} — ABNORMAL LOW (ref >90)
Glucose: 85 mg/dl (ref 70–140)
Potassium: 5.1 mEq/L (ref 3.5–5.1)
Sodium: 138 mEq/L (ref 136–145)
Total Bilirubin: 0.51 mg/dL (ref 0.20–1.20)
Total Protein: 6.4 g/dL (ref 6.4–8.3)

## 2016-03-19 NOTE — Telephone Encounter (Signed)
I reviewed blood test results with the patient over the telephone. She has persistent pancytopenia although platelet count has improved. I recommend she continues to hold Revlimid. Her potassium is back to normal. I told her to discontinue potassium replacement therapy. I recommend repeat blood work again next Friday on 03/26/2016

## 2016-03-26 ENCOUNTER — Telehealth: Payer: Self-pay | Admitting: *Deleted

## 2016-03-26 ENCOUNTER — Other Ambulatory Visit: Payer: Medicare Other

## 2016-03-26 NOTE — Telephone Encounter (Signed)
Pt called states she doesn't feel well,  Was up several times w/ diarrhea last night, which she says is not "too bad" but she not sleep well.  She is also nauseated,  Just took a "nausea pill" and an imodium.  She asks if she can cancel her lab appt today as she just wants to go back to bed.   Dr. Alvy Bimler says pt can r/s her lab to Monday as long as she takes her imodium and controls the diarrhea,  Drinks plenty of fluids.  Continue to hold Revlimid as instructed.  Informed pt of Dr. Calton Dach instructions.  Reinforced using imodium for diarrhea up to 8 tablets per day if needed.  Explained diarrhea can lower her potassium level again.  Pt has only taken one imodium so far today.  Instructed her to call back if imodium doesn't help and Dr. Alvy Bimler can prescribe lomotil.  Lab r/s to Monday at 11:30 am.   Pt verbalized understanding and states grateful to go back to bed this morning.

## 2016-03-29 ENCOUNTER — Telehealth: Payer: Self-pay | Admitting: *Deleted

## 2016-03-29 ENCOUNTER — Other Ambulatory Visit (HOSPITAL_BASED_OUTPATIENT_CLINIC_OR_DEPARTMENT_OTHER): Payer: Medicare Other

## 2016-03-29 ENCOUNTER — Other Ambulatory Visit: Payer: Self-pay | Admitting: Hematology and Oncology

## 2016-03-29 DIAGNOSIS — C9001 Multiple myeloma in remission: Secondary | ICD-10-CM | POA: Diagnosis not present

## 2016-03-29 LAB — CBC WITH DIFFERENTIAL/PLATELET
BASO%: 1.4 % (ref 0.0–2.0)
Basophils Absolute: 0 10*3/uL (ref 0.0–0.1)
EOS%: 4.5 % (ref 0.0–7.0)
Eosinophils Absolute: 0.1 10*3/uL (ref 0.0–0.5)
HCT: 28.2 % — ABNORMAL LOW (ref 34.8–46.6)
HGB: 9.2 g/dL — ABNORMAL LOW (ref 11.6–15.9)
LYMPH%: 35.6 % (ref 14.0–49.7)
MCH: 30.5 pg (ref 25.1–34.0)
MCHC: 32.6 g/dL (ref 31.5–36.0)
MCV: 93.4 fL (ref 79.5–101.0)
MONO#: 0.5 10*3/uL (ref 0.1–0.9)
MONO%: 16.3 % — ABNORMAL HIGH (ref 0.0–14.0)
NEUT#: 1.2 10*3/uL — ABNORMAL LOW (ref 1.5–6.5)
NEUT%: 42.2 % (ref 38.4–76.8)
Platelets: 145 10*3/uL (ref 145–400)
RBC: 3.02 10*6/uL — ABNORMAL LOW (ref 3.70–5.45)
RDW: 18.8 % — ABNORMAL HIGH (ref 11.2–14.5)
WBC: 2.9 10*3/uL — ABNORMAL LOW (ref 3.9–10.3)
lymph#: 1 10*3/uL (ref 0.9–3.3)

## 2016-03-29 LAB — COMPREHENSIVE METABOLIC PANEL
ALT: 12 U/L (ref 0–55)
AST: 17 U/L (ref 5–34)
Albumin: 3.7 g/dL (ref 3.5–5.0)
Alkaline Phosphatase: 91 U/L (ref 40–150)
Anion Gap: 7 mEq/L (ref 3–11)
BUN: 9.2 mg/dL (ref 7.0–26.0)
CO2: 22 mEq/L (ref 22–29)
Calcium: 9.8 mg/dL (ref 8.4–10.4)
Chloride: 112 mEq/L — ABNORMAL HIGH (ref 98–109)
Creatinine: 1.1 mg/dL (ref 0.6–1.1)
EGFR: 52 mL/min/{1.73_m2} — ABNORMAL LOW (ref >90)
Glucose: 85 mg/dl (ref 70–140)
Potassium: 3.8 mEq/L (ref 3.5–5.1)
Sodium: 141 mEq/L (ref 136–145)
Total Bilirubin: 0.41 mg/dL (ref 0.20–1.20)
Total Protein: 6.4 g/dL (ref 6.4–8.3)

## 2016-03-29 NOTE — Telephone Encounter (Signed)
-----   Message from [name], MD sent at 03/29/2016 12:11 PM EDT ----- Regarding: labs PLs tell her labs are better She can stop potassium WBC is still low, I would recommend she continue to hold Revlimid until her next lab draw ----- Message ----- From: Interface, Lab In Three Zero One Sent: 03/29/2016  11:40 AM To: [name], MD

## 2016-03-29 NOTE — Telephone Encounter (Signed)
Pt notified of message below. Verbalized understanding. Confirmed lab appt on 04/09/16.

## 2016-04-01 ENCOUNTER — Encounter: Payer: Self-pay | Admitting: Hematology and Oncology

## 2016-04-01 NOTE — Progress Notes (Signed)
Tried calling heather at [facility] but could not leave a message. Need to advise revlimid is on hold per dr notes. [place] is her ext. I will call later

## 2016-04-06 ENCOUNTER — Telehealth: Payer: Self-pay | Admitting: *Deleted

## 2016-04-06 MED ORDER — GABAPENTIN 300 MG PO CAPS: 300.0000 mg | ORAL_CAPSULE | Freq: Every day | ORAL | 0 refills | Status: DC

## 2016-04-06 NOTE — Telephone Encounter (Signed)
Pt called states needs refill on Gabapentin.  She also asks if she needs to keep appts as scheduled here this Friday 9/1?   She thought Dr. Alvy Bimler said she may not need to come this week since she was seen by Dr. Cassell Clement at [facility] last week.   Pt says Dr. Cassell Clement told her she would call Dr. Alvy Bimler about her zometa and Revlimid.  Informed pt will check w/ Dr. Alvy Bimler and call her back tomorrow.  Gabapentin refill sent to pt's pharmacy.  She verbalized understanding.

## 2016-04-07 ENCOUNTER — Other Ambulatory Visit: Payer: Self-pay | Admitting: Hematology and Oncology

## 2016-04-07 ENCOUNTER — Telehealth: Payer: Self-pay | Admitting: *Deleted

## 2016-04-07 DIAGNOSIS — C9001 Multiple myeloma in remission: Secondary | ICD-10-CM

## 2016-04-07 MED ORDER — LENALIDOMIDE 5 MG PO CAPS: 5.0000 mg | ORAL_CAPSULE | Freq: Every day | ORAL | 0 refills | Status: DC

## 2016-04-07 NOTE — Telephone Encounter (Signed)
Informed pt that Dr. Alvy Bimler reviewed Dr. Gerre Scull note and it is recommended pt continue to hold Revlimid until her [place] is above 1.5.  Then Revlimid dose will be decreased to 5 mg from 10 mg.   Also Dr. Cassell Clement did say it is ok to give Zometa every 3 months instead of monthly.  Dr. Alvy Bimler recommends pt keep lab/ Zometa appt for this Friday then she will go to every 3 months after that.  Dr. Alvy Bimler will see pt again in December w/ lab and zometa infusion.  Scheduling message sent.  Pt verbalized understanding.

## 2016-04-09 ENCOUNTER — Ambulatory Visit: Payer: Medicare Other

## 2016-04-09 ENCOUNTER — Telehealth: Payer: Self-pay | Admitting: Hematology and Oncology

## 2016-04-09 ENCOUNTER — Ambulatory Visit: Payer: Medicare Other | Admitting: Hematology and Oncology

## 2016-04-09 ENCOUNTER — Ambulatory Visit (HOSPITAL_BASED_OUTPATIENT_CLINIC_OR_DEPARTMENT_OTHER): Payer: Medicare Other

## 2016-04-09 ENCOUNTER — Telehealth: Payer: Self-pay | Admitting: *Deleted

## 2016-04-09 ENCOUNTER — Other Ambulatory Visit (HOSPITAL_BASED_OUTPATIENT_CLINIC_OR_DEPARTMENT_OTHER): Payer: Medicare Other

## 2016-04-09 VITALS — BP 132/92 | HR 66 | Temp 98.6°F | Resp 20

## 2016-04-09 DIAGNOSIS — C9001 Multiple myeloma in remission: Secondary | ICD-10-CM | POA: Diagnosis not present

## 2016-04-09 DIAGNOSIS — Z9481 Bone marrow transplant status: Secondary | ICD-10-CM

## 2016-04-09 LAB — CBC WITH DIFFERENTIAL/PLATELET
BASO%: 0.8 % (ref 0.0–2.0)
Basophils Absolute: 0 10*3/uL (ref 0.0–0.1)
EOS%: 4.5 % (ref 0.0–7.0)
Eosinophils Absolute: 0.2 10*3/uL (ref 0.0–0.5)
HCT: 32.1 % — ABNORMAL LOW (ref 34.8–46.6)
HGB: 10.2 g/dL — ABNORMAL LOW (ref 11.6–15.9)
LYMPH%: 30.9 % (ref 14.0–49.7)
MCH: 30.2 pg (ref 25.1–34.0)
MCHC: 31.9 g/dL (ref 31.5–36.0)
MCV: 94.7 fL (ref 79.5–101.0)
MONO#: 0.4 10*3/uL (ref 0.1–0.9)
MONO%: 9.9 % (ref 0.0–14.0)
NEUT#: 2 10*3/uL (ref 1.5–6.5)
NEUT%: 53.9 % (ref 38.4–76.8)
Platelets: 197 10*3/uL (ref 145–400)
RBC: 3.38 10*6/uL — ABNORMAL LOW (ref 3.70–5.45)
RDW: 19.1 % — ABNORMAL HIGH (ref 11.2–14.5)
WBC: 3.8 10*3/uL — ABNORMAL LOW (ref 3.9–10.3)
lymph#: 1.2 10*3/uL (ref 0.9–3.3)

## 2016-04-09 LAB — COMPREHENSIVE METABOLIC PANEL
ALT: 11 U/L (ref 0–55)
AST: 17 U/L (ref 5–34)
Albumin: 4 g/dL (ref 3.5–5.0)
Alkaline Phosphatase: 89 U/L (ref 40–150)
Anion Gap: 10 mEq/L (ref 3–11)
BUN: 10.9 mg/dL (ref 7.0–26.0)
CO2: 22 mEq/L (ref 22–29)
Calcium: 9.5 mg/dL (ref 8.4–10.4)
Chloride: 112 mEq/L — ABNORMAL HIGH (ref 98–109)
Creatinine: 1.1 mg/dL (ref 0.6–1.1)
EGFR: 50 mL/min/{1.73_m2} — ABNORMAL LOW (ref >90)
Glucose: 79 mg/dl (ref 70–140)
Potassium: 4.2 mEq/L (ref 3.5–5.1)
Sodium: 143 mEq/L (ref 136–145)
Total Bilirubin: 0.38 mg/dL (ref 0.20–1.20)
Total Protein: 6.9 g/dL (ref 6.4–8.3)

## 2016-04-09 MED ORDER — ZOLEDRONIC ACID 4 MG/5ML IV CONC
3.0000 mg | Freq: Once | INTRAVENOUS | Status: AC
  Administered 2016-04-09: 3 mg via INTRAVENOUS
  Filled 2016-04-09: qty 3.75

## 2016-04-09 MED ORDER — SODIUM CHLORIDE 0.9 % IV SOLN
Freq: Once | INTRAVENOUS | Status: AC
  Administered 2016-04-09: 13:00:00 via INTRAVENOUS

## 2016-04-09 NOTE — Patient Instructions (Signed)

## 2016-04-09 NOTE — Telephone Encounter (Signed)
Spoke with pt to confirm Nov and Dec appt date/times

## 2016-04-09 NOTE — Telephone Encounter (Signed)
S/w pt in lobby.  Gave copy of lab results. ANC 2.0.  Instructed her to resume Revlimid now at reduced dose 5 mg daily.  Pt is getting medication delivered today..  Rx says daily for 28 days but pt said she thought Dr. Alvy Bimler or Dr. Cassell Clement was going to change it to 21 days on 7 days off?  Informed pt I will clarify w/ Dr. Alvy Bimler.    Also informed pt of next appts for lab/MD/Zometa requested to be scheduled for 3 months on 12/1.  Call us if any problems before then.  Will call her to clarify Revlimid. Pt wants Dr. Alvy Bimler to know she is getting MRI of brain and Skeletal Xray at [facility] on Tuesday 9/5.

## 2016-04-13 ENCOUNTER — Telehealth: Payer: Self-pay | Admitting: *Deleted

## 2016-04-13 NOTE — Telephone Encounter (Signed)
Pls clarify dose of revlimid should be 21 days on, 7 days off. She will have extra this cycle.  For next cycle, we will change prescription to 21 days on 7 days off

## 2016-04-13 NOTE — Telephone Encounter (Signed)
Pt notified she is to take Revlimid 21 days, then off for 7. She will have extra tablets with this cycle. Verbalized understanding

## 2016-04-27 ENCOUNTER — Telehealth: Payer: Self-pay | Admitting: *Deleted

## 2016-04-27 MED ORDER — MAGIC MOUTHWASH W/LIDOCAINE: 5.0000 mL | Freq: Four times a day (QID) | ORAL | 0 refills | Status: DC | PRN

## 2016-04-27 NOTE — Telephone Encounter (Signed)
Pt states she has very bad mouth sores. Pink with white spots. No fever, cannot brush teeth.  Called in magic mouth wash

## 2016-04-30 ENCOUNTER — Ambulatory Visit (HOSPITAL_BASED_OUTPATIENT_CLINIC_OR_DEPARTMENT_OTHER): Payer: Medicare Other

## 2016-04-30 ENCOUNTER — Other Ambulatory Visit: Payer: Self-pay | Admitting: *Deleted

## 2016-04-30 ENCOUNTER — Other Ambulatory Visit: Payer: Self-pay | Admitting: Nurse Practitioner

## 2016-04-30 ENCOUNTER — Ambulatory Visit (HOSPITAL_BASED_OUTPATIENT_CLINIC_OR_DEPARTMENT_OTHER): Payer: Medicare Other | Admitting: Nurse Practitioner

## 2016-04-30 ENCOUNTER — Telehealth: Payer: Self-pay | Admitting: Hematology and Oncology

## 2016-04-30 ENCOUNTER — Encounter: Payer: Self-pay | Admitting: Nurse Practitioner

## 2016-04-30 ENCOUNTER — Telehealth: Payer: Self-pay | Admitting: *Deleted

## 2016-04-30 VITALS — BP 136/80 | HR 80 | Temp 98.5°F | Resp 18 | Ht 60.25 in | Wt 111.9 lb

## 2016-04-30 DIAGNOSIS — K1379 Other lesions of oral mucosa: Secondary | ICD-10-CM

## 2016-04-30 DIAGNOSIS — B3781 Candidal esophagitis: Secondary | ICD-10-CM

## 2016-04-30 DIAGNOSIS — B009 Herpesviral infection, unspecified: Secondary | ICD-10-CM

## 2016-04-30 DIAGNOSIS — E860 Dehydration: Secondary | ICD-10-CM

## 2016-04-30 DIAGNOSIS — E876 Hypokalemia: Secondary | ICD-10-CM | POA: Diagnosis not present

## 2016-04-30 DIAGNOSIS — C9001 Multiple myeloma in remission: Secondary | ICD-10-CM

## 2016-04-30 DIAGNOSIS — B370 Candidal stomatitis: Secondary | ICD-10-CM | POA: Diagnosis not present

## 2016-04-30 LAB — COMPREHENSIVE METABOLIC PANEL
ALT: 24 U/L (ref 0–55)
AST: 18 U/L (ref 5–34)
Albumin: 3.5 g/dL (ref 3.5–5.0)
Alkaline Phosphatase: 142 U/L (ref 40–150)
Anion Gap: 11 mEq/L (ref 3–11)
BUN: 12.3 mg/dL (ref 7.0–26.0)
CO2: 22 mEq/L (ref 22–29)
Calcium: 9 mg/dL (ref 8.4–10.4)
Chloride: 112 mEq/L — ABNORMAL HIGH (ref 98–109)
Creatinine: 1.1 mg/dL (ref 0.6–1.1)
EGFR: 50 mL/min/{1.73_m2} — ABNORMAL LOW (ref >90)
Glucose: 97 mg/dl (ref 70–140)
Potassium: 3.3 mEq/L — ABNORMAL LOW (ref 3.5–5.1)
Sodium: 144 mEq/L (ref 136–145)
Total Bilirubin: 0.51 mg/dL (ref 0.20–1.20)
Total Protein: 6.7 g/dL (ref 6.4–8.3)

## 2016-04-30 LAB — CBC WITH DIFFERENTIAL/PLATELET
BASO%: 1.1 % (ref 0.0–2.0)
Basophils Absolute: 0.1 10*3/uL (ref 0.0–0.1)
EOS%: 4.3 % (ref 0.0–7.0)
Eosinophils Absolute: 0.2 10*3/uL (ref 0.0–0.5)
HCT: 31.8 % — ABNORMAL LOW (ref 34.8–46.6)
HGB: 10.2 g/dL — ABNORMAL LOW (ref 11.6–15.9)
LYMPH%: 21.4 % (ref 14.0–49.7)
MCH: 29.8 pg (ref 25.1–34.0)
MCHC: 32.2 g/dL (ref 31.5–36.0)
MCV: 92.7 fL (ref 79.5–101.0)
MONO#: 0.8 10*3/uL (ref 0.1–0.9)
MONO%: 13.9 % (ref 0.0–14.0)
NEUT#: 3.3 10*3/uL (ref 1.5–6.5)
NEUT%: 59.3 % (ref 38.4–76.8)
Platelets: 190 10*3/uL (ref 145–400)
RBC: 3.43 10*6/uL — ABNORMAL LOW (ref 3.70–5.45)
RDW: 16.3 % — ABNORMAL HIGH (ref 11.2–14.5)
WBC: 5.6 10*3/uL (ref 3.9–10.3)
lymph#: 1.2 10*3/uL (ref 0.9–3.3)

## 2016-04-30 MED ORDER — MORPHINE SULFATE 4 MG/ML IJ SOLN
2.0000 mg | INTRAMUSCULAR | Status: AC | PRN
  Administered 2016-04-30 (×2): 2 mg via INTRAVENOUS
  Filled 2016-04-30: qty 1

## 2016-04-30 MED ORDER — MORPHINE SULFATE 4 MG/ML IJ SOLN
2.0000 mg | Freq: Once | INTRAMUSCULAR | Status: AC
  Administered 2016-04-30: 2 mg via INTRAVENOUS
  Filled 2016-04-30: qty 1

## 2016-04-30 MED ORDER — ONDANSETRON HCL 40 MG/20ML IJ SOLN
Freq: Once | INTRAMUSCULAR | Status: AC
  Administered 2016-04-30: 11:00:00 via INTRAVENOUS
  Filled 2016-04-30: qty 4

## 2016-04-30 MED ORDER — ACYCLOVIR 800 MG PO TABS: 800.0000 mg | ORAL_TABLET | Freq: Two times a day (BID) | ORAL | 0 refills | Status: DC

## 2016-04-30 MED ORDER — SODIUM CHLORIDE 0.9 % IV SOLN
INTRAVENOUS | Status: AC
  Administered 2016-04-30: 13:00:00 via INTRAVENOUS
  Filled 2016-04-30: qty 1000

## 2016-04-30 MED ORDER — MORPHINE SULFATE (PF) 4 MG/ML IV SOLN
INTRAVENOUS | Status: AC
  Filled 2016-04-30: qty 1

## 2016-04-30 MED ORDER — HYDROCODONE-ACETAMINOPHEN 7.5-325 MG/15ML PO SOLN: 10.0000 mL | Freq: Four times a day (QID) | ORAL | 0 refills | Status: DC | PRN

## 2016-04-30 MED ORDER — SODIUM CHLORIDE 0.9 % IV SOLN
Freq: Once | INTRAVENOUS | Status: AC
Start: 2016-04-30 — End: 2016-04-30
  Administered 2016-04-30: 11:00:00 via INTRAVENOUS

## 2016-04-30 MED ORDER — FLUCONAZOLE 100 MG PO TABS: 100.0000 mg | ORAL_TABLET | Freq: Every day | ORAL | 0 refills | Status: DC

## 2016-04-30 MED ORDER — MAGIC MOUTHWASH W/LIDOCAINE: 5.0000 mL | Freq: Four times a day (QID) | ORAL | 2 refills | Status: DC | PRN

## 2016-04-30 MED FILL — HYDROCOD-APAP 7.5-325/15ML: 7.5-325 | 3 days supply | Qty: 120 | Fill #0

## 2016-04-30 MED FILL — FLUCONAZOLE 100 MG TABLET: 100 | 15 days supply | Qty: 15 | Fill #0

## 2016-04-30 MED FILL — MAGIC MOUTHWASH W/LIDO 1:1: 6 days supply | Qty: 120 | Fill #0

## 2016-04-30 MED FILL — ACYCLOVIR 800 MG TABLET: 800 | 14 days supply | Qty: 28 | Fill #0

## 2016-04-30 NOTE — Assessment & Plan Note (Addendum)
Patient states that she has had significant mouth and throat pain; and has taken in minimal oral intake recently.  She feels dehydrated today.  Exam today reveals obvious thrush to the tongue.  There is also some oral lesions to the inside of patient's lower lip and to the lower gums as well.  Posterior oropharynx appears clear with no exudate or thrush.  Also, patient has a cold sore that is slowly healing to the lower lip as well.  Patient will receive IV fluid rehydration today.  She was given prescriptions for Diflucan 100 mg to be taken every day; and will also receive acyclovir can for treatment of the cold sore.  Also, she was called in Magic mouthwash with nystatin and lidocaine as well.  Note: Patient states that she was taking acyclovir 800 mg twice daily; and had no cold sores whatsoever.  She discontinued the acyclovir at the end of August 2017.  She plans to return this coming Monday, 05/03/2016 for additional IV fluid rehydration.

## 2016-04-30 NOTE — Progress Notes (Signed)
SYMPTOM MANAGEMENT CLINIC    Chief Complaint: Esophagitis, dehydration, hypokalemia  HPI:  Jessica [name] 65 y.o. female diagnosed with multiple myeloma.  Currently undergoing oral Revlimid and Zometa.   Oncology History    M-protein 0.69 gm/dl IFIX - IgG, Kappa IgG - 868 IgA - 19 IgM - < 20 Kappa - 21 Lambda - 5.7  09/06/2014 - Bone marrow aspirate and biopsy:   Normocellular marrow for age (40%) with a small monoclonal plasma cell population (1% on aspirate). Karyotype 75, XX  FISH Negative for myeloma associated changes  09/12/2014 - PET/CT  Two regions that are concerning for disease, one adjacent/involving the left ninth rib and one in the marrow of the right femur, in this patient with history of plasmacytoma.      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      09/06/2014 Bone Marrow Biopsy    BM performed at [facility] is not consistent with multiple myeloma, 1% plasma cell on aspirate      09/12/2014 Imaging     PET CT scan show involvement of left ninth rib and right femur      09/23/2014 - 10/23/2014 Radiation Therapy     she had radiation therapy to the cavernous sinus and skull base lesions, 45 Gy      10/21/2014 - 11/01/2014 Radiation Therapy     she had radiation to right femur , total 30 Gy      11/26/2014 - 02/14/2015 Chemotherapy     she is started on weekly dexamethasone, Velcade twice a week on day 1, 4, 8 and 11 and Revlimid days 1-14.      04/01/2015 Bone Marrow Transplant    She received melphalan chemotherapy on 03/31/2015 followed by autologous stem cell transplant the day  after      04/03/2015 - 04/18/2015 Hospital Admission    The patient was admitted to the hospital at [place] for management related to complication from stem cell transplant. She had significant nausea requiring intravenous anti-emetics.      07/17/2015 -  Chemotherapy    She started maintenance Revlimid and monthly zometa       Review of Systems  Constitutional: Positive for malaise/fatigue and weight loss.  HENT: Positive for sore throat.   Gastrointestinal: Positive for nausea.  Neurological: Positive for weakness.  All other systems reviewed and are negative.   Past Medical History:  Diagnosis Date  . H/O stem cell transplant [facility]) 03/2015   [facility]  . Multiple myeloma ([place]) 11/19/2014  . Multiple myeloma ([place])   . Multiple myeloma ([place])   . MVP (mitral valve prolapse)    req prophylaxis  . Other constipation 12/03/2014  . Right ovarian cyst 10/16/15   16 mm simple cyst - ultrasound yearly.  [facility] Upper respiratory infection, acute 08/12/2015    Past Surgical History:  Procedure Laterality Date  . CATARACT EXTRACTION, BILATERAL  10/2010   Implants(ReSTOR) Bilat  . LASER ABLATION OF CONDYLOMAS  1990   CIN 1 cervix  . multiple myeloma surgery Right 08/2014   [facility]    has Multiple  myeloma in remission ([place]); Prerenal renal failure; 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[place]); Neuropathic pain of left flank; Dehydration; Weakness; Other constipation; Superficial thrombophlebitis of upper extremity; Sty, external; Infection of eyelid; Protein calorie malnutrition ([place]); Neuropathy due to chemotherapeutic drug ([place]); Essential hypertension; Petechiae; S/P bone marrow transplant ([place]); Hypotension due to drugs; Varicose vein of leg; Nasal congestion; Hypokalemia, gastrointestinal losses; Chronic kidney disease, stage III (moderate); Candidal esophagitis ([place]); and Herpes simplex on her problem list.    is allergic to albuterol; codeine;  ibuprofen; nsaids; and augmentin [amoxicillin-pot clavulanate].    Medication List       Accurate as of 04/30/16  5:25 PM. Always use your most recent med list.          acyclovir 800 MG tablet Commonly known as:  ZOVIRAX Take 800 mg by mouth 2 (two) times daily.   acyclovir 800 MG tablet Commonly known as:  ZOVIRAX Take 1 tablet (800 mg total) by mouth 2 (two) times daily.   aspirin EC 81 MG tablet Take 81 mg by mouth daily with breakfast.   calcium carbonate 1500 (600 Ca) MG Tabs tablet Commonly known as:  OSCAL Take 1,500 mg by mouth daily with breakfast.   CENTRUM SILVER PO Take by mouth.   fluconazole 100 MG tablet Commonly known as:  DIFLUCAN Take 1 tablet (100 mg total) by mouth daily.   fluticasone 50 MCG/ACT nasal spray Commonly known as:  FLONASE Place 1 spray into both nostrils 2 (two) times daily.   gabapentin 300 MG capsule Commonly known as:  NEURONTIN Take 1 capsule (300 mg total) by mouth at bedtime.   HYDROcodone-acetaminophen 7.5-325 mg/15 ml solution Commonly known as:  HYCET Take 10 mLs by mouth 4 (four) times daily as needed for moderate pain.   lenalidomide 5 MG capsule Commonly known as:  REVLIMID Take 1 capsule (5 mg total) by mouth daily.   magic mouthwash w/lidocaine Soln Take 5 mLs by mouth 4 (four) times daily as needed for mouth pain.   magic mouthwash w/lidocaine Soln Take 5 mLs by mouth 4 (four) times daily as needed for mouth pain.   omeprazole 20 MG capsule Commonly known as:  PRILOSEC Take 1 capsule by mouth daily.   omeprazole 20 MG capsule Commonly known as:  PRILOSEC take 1 capsule by mouth once daily   ondansetron 8 MG tablet Commonly known as:  ZOFRAN Take 1 tablet (8 mg total) by mouth every 8 (eight) hours as needed for nausea or vomiting.   potassium chloride SA 20 MEQ tablet Commonly known as:  K-DUR,KLOR-CON Take 2 tablets (40 mEq total) by mouth 2 (two) times daily.   senna-docusate 8.6-50 MG  tablet Commonly known as:  Senokot-S Take 2 tablets by mouth once as needed.        PHYSICAL EXAMINATION  Oncology Vitals 04/30/2016 04/30/2016  Height - 153 cm  Weight - 50.758 kg  Weight (lbs) - 111 lbs 14 oz  BMI (kg/m2) - 21.67 kg/m2  Temp 98.5 98.8  Pulse 80 73  Resp 18 18  SpO2 97 96  BSA (m2) - 1.47 m2   BP Readings from Last 2 Encounters:  04/30/16 136/80  04/09/16 (!) 132/92    Physical Exam  Constitutional: She is oriented to person, place, and time. Vital signs are normal. She appears malnourished and dehydrated. She appears unhealthy. She appears cachectic.  HENT:  Head: Normocephalic and atraumatic.  Right Ear: External ear normal.  Left Ear: External ear normal.  Exam  today reveals obvious thrush to the tongue.  There is also some oral lesions to the inside of patient's lower lip and to the lower gums as well.  Posterior oropharynx appears clear with no exudate or thrush.  Also, patient has a cold sore that is slowly healing to the lower lip as well.    Eyes: Conjunctivae and EOM are normal. Pupils are equal, round, and reactive to light. Right eye exhibits no discharge. Left eye exhibits no discharge. No scleral icterus.  Neck: Normal range of motion. Neck supple.  Pulmonary/Chest: Effort normal. No respiratory distress.  Musculoskeletal: Normal range of motion. She exhibits no edema or tenderness.  Neurological: She is alert and oriented to person, place, and time. Gait normal.  Skin: Skin is warm and dry. No rash noted. No erythema. There is pallor.  Psychiatric: Affect normal.  Nursing note and vitals reviewed.   LABORATORY DATA:. Appointment on 04/30/2016  Component Date Value Ref Range Status  . WBC 04/30/2016 5.6  3.9 - 10.3 10e3/uL Final  . NEUT# 04/30/2016 3.3  1.5 - 6.5 10e3/uL Final  . HGB 04/30/2016 10.2* 11.6 - 15.9 g/dL Final  . HCT 04/30/2016 31.8* 34.8 - 46.6 % Final  . Platelets 04/30/2016 190  145 - 400 10e3/uL Final  . MCV  04/30/2016 92.7  79.5 - 101.0 fL Final  . MCH 04/30/2016 29.8  25.1 - 34.0 pg Final  . MCHC 04/30/2016 32.2  31.5 - 36.0 g/dL Final  . RBC 04/30/2016 3.43* 3.70 - 5.45 10e6/uL Final  . RDW 04/30/2016 16.3* 11.2 - 14.5 % Final  . lymph# 04/30/2016 1.2  0.9 - 3.3 10e3/uL Final  . MONO# 04/30/2016 0.8  0.1 - 0.9 10e3/uL Final  . Eosinophils Absolute 04/30/2016 0.2  0.0 - 0.5 10e3/uL Final  . Basophils Absolute 04/30/2016 0.1  0.0 - 0.1 10e3/uL Final  . NEUT% 04/30/2016 59.3  38.4 - 76.8 % Final  . LYMPH% 04/30/2016 21.4  14.0 - 49.7 % Final  . MONO% 04/30/2016 13.9  0.0 - 14.0 % Final  . EOS% 04/30/2016 4.3  0.0 - 7.0 % Final  . BASO% 04/30/2016 1.1  0.0 - 2.0 % Final  . Sodium 04/30/2016 144  136 - 145 mEq/L Final  . Potassium 04/30/2016 3.3* 3.5 - 5.1 mEq/L Final  . Chloride 04/30/2016 112* 98 - 109 mEq/L Final  . CO2 04/30/2016 22  22 - 29 mEq/L Final  . Glucose 04/30/2016 97  70 - 140 mg/dl Final  . BUN 04/30/2016 12.3  7.0 - 26.0 mg/dL Final  . Creatinine 04/30/2016 1.1  0.6 - 1.1 mg/dL Final  . Total Bilirubin 04/30/2016 0.51  0.20 - 1.20 mg/dL Final  . Alkaline Phosphatase 04/30/2016 142  40 - 150 U/L Final  . AST 04/30/2016 18  5 - 34 U/L Final  . ALT 04/30/2016 24  0 - 55 U/L Final  . Total Protein 04/30/2016 6.7  6.4 - 8.3 g/dL Final  . Albumin 04/30/2016 3.5  3.5 - 5.0 g/dL Final  . Calcium 04/30/2016 9.0  8.4 - 10.4 mg/dL Final  . Anion Gap 04/30/2016 11  3 - 11 mEq/L Final  . EGFR 04/30/2016 50* >90 ml/min/1.73 m2 Final    RADIOGRAPHIC STUDIES: No results found.  ASSESSMENT/PLAN:    Multiple myeloma in remission [facility]) Patient continues to take reduced dose Revlimid oral therapy as directed.  Patient also continues to receive Zometa on an every three-month basis.  She is scheduled to return for labs and symptom  management clinic visit for recheck on Monday, 05/03/2016.  She is scheduled for labs only on 06/30/2016.  She is scheduled for visit, and her next Zometa on  07/09/2016.  Hypokalemia, gastrointestinal losses Patient's potassium was down to 3.3 today.  Patient states that she has been given potassium tablets in the past; but does not feel that she would be comfortable swallowing them.  At this point.  Patient was given potassium 20 mEq IV today.  We'll recheck potassium when patient returns on Monday.  Dehydration Patient states that she has had significant mouth and throat pain; and has decreased oral intake recently.  She feels dehydrated today.  She received IV fluid rehydration while at the cancer Center today.  Candidal esophagitis ([place]) Patient states that she has had significant mouth and throat pain; and has taken in minimal oral intake recently.  She feels dehydrated today.  Exam today reveals obvious thrush to the tongue.  There is also some oral lesions to the inside of patient's lower lip and to the lower gums as well.  Posterior oropharynx appears clear with no exudate or thrush.  Also, patient has a cold sore that is slowly healing to the lower lip as well.  Patient will receive IV fluid rehydration today.  She was given prescriptions for Diflucan 100 mg to be taken every day; and will also receive acyclovir can for treatment of the cold sore.  Also, she was called in Magic mouthwash with nystatin and lidocaine as well.  She plans to return this coming Monday, 05/03/2016 for additional IV fluid rehydration.  Herpes simplex Patient states that she has had significant mouth and throat pain; and has taken in minimal oral intake recently.  She feels dehydrated today.  Exam today reveals obvious thrush to the tongue.  There is also some oral lesions to the inside of patient's lower lip and to the lower gums as well.  Posterior oropharynx appears clear with no exudate or thrush.  Also, patient has a cold sore that is slowly healing to the lower lip as well.  Patient will receive IV fluid rehydration today.  She was given prescriptions for  Diflucan 100 mg to be taken every day; and will also receive acyclovir can for treatment of the cold sore.  Also, she was called in Magic mouthwash with nystatin and lidocaine as well.  Note: Patient states that she was taking acyclovir 800 mg twice daily; and had no cold sores whatsoever.  She discontinued the acyclovir at the end of August 2017.  She plans to return this coming Monday, 05/03/2016 for additional IV fluid rehydration.   Patient stated understanding of all instructions; and was in agreement with this plan of care. The patient knows to call the clinic with any problems, questions or concerns.   Total time spent with patient was 40 minutes;  with greater than 75 percent of that time spent in face to face counseling regarding patient's symptoms,  and coordination of care and follow up.  Disclaimer:This dictation was prepared with Dragon/digital dictation along with [employer]. Any transcriptional errors that result from this process are unintentional.  Drue Second, NP 04/30/2016

## 2016-04-30 NOTE — Assessment & Plan Note (Signed)
Patient states that she has had significant mouth and throat pain; and has taken in minimal oral intake recently.  She feels dehydrated today.  Exam today reveals obvious thrush to the tongue.  There is also some oral lesions to the inside of patient's lower lip and to the lower gums as well.  Posterior oropharynx appears clear with no exudate or thrush.  Also, patient has a cold sore that is slowly healing to the lower lip as well.  Patient will receive IV fluid rehydration today.  She was given prescriptions for Diflucan 100 mg to be taken every day; and will also receive acyclovir can for treatment of the cold sore.  Also, she was called in Magic mouthwash with nystatin and lidocaine as well.  She plans to return this coming Monday, 05/03/2016 for additional IV fluid rehydration.

## 2016-04-30 NOTE — Patient Instructions (Signed)
Dehydration, Adult Dehydration is a condition in which you do not have enough fluid or water in your body. It happens when you take in less fluid than you lose. Vital organs such as the kidneys, brain, and heart cannot function without a proper amount of fluids. Any loss of fluids from the body can cause dehydration.  Dehydration can range from mild to severe. This condition should be treated right away to help prevent it from becoming severe. CAUSES  This condition may be caused by:  Vomiting.  Diarrhea.  Excessive sweating, such as when exercising in hot or humid weather.  Not drinking enough fluid during strenuous exercise or during an illness.  Excessive urine output.  Fever.  Certain medicines. RISK FACTORS This condition is more likely to develop in:  People who are taking certain medicines that cause the body to lose excess fluid (diuretics).   People who have a chronic illness, such as diabetes, that may increase urination.  Older adults.   People who live at high altitudes.   People who participate in endurance sports.  SYMPTOMS  Mild Dehydration  Thirst.  Dry lips.  Slightly dry mouth.  Dry, warm skin. Moderate Dehydration  Very dry mouth.   Muscle cramps.   Dark urine and decreased urine production.   Decreased tear production.   Headache.   Light-headedness, especially when you stand up from a sitting position.  Severe Dehydration  Changes in skin.   Cold and clammy skin.   Skin does not spring back quickly when lightly pinched and released.   Changes in body fluids.   Extreme thirst.   No tears.   Not able to sweat when body temperature is high, such as in hot weather.   Minimal urine production.   Changes in vital signs.   Rapid, weak pulse (more than 100 beats per minute when you are sitting still).   Rapid breathing.   Low blood pressure.   Other changes.   Sunken eyes.   Cold hands and feet.    Confusion.  Lethargy and difficulty being awakened.  Fainting (syncope).   Short-term weight loss.   Unconsciousness. DIAGNOSIS  This condition may be diagnosed based on your symptoms. You may also have tests to determine how severe your dehydration is. These tests may include:   Urine tests.   Blood tests.  TREATMENT  Treatment for this condition depends on the severity. Mild or moderate dehydration can often be treated at home. Treatment should be started right away. Do not wait until dehydration becomes severe. Severe dehydration needs to be treated at the hospital. Treatment for Mild Dehydration  Drinking plenty of water to replace the fluid you have lost.   Replacing minerals in your blood (electrolytes) that you may have lost.  Treatment for Moderate Dehydration  Consuming oral rehydration solution (ORS). Treatment for Severe Dehydration  Receiving fluid through an IV tube.   Receiving electrolyte solution through a feeding tube that is passed through your nose and into your stomach (nasogastric tube or NG tube).  Correcting any abnormalities in electrolytes. HOME CARE INSTRUCTIONS   Drink enough fluid to keep your urine clear or pale yellow.   Drink water or fluid slowly by taking small sips. You can also try sucking on ice cubes.  Have food or beverages that contain electrolytes. Examples include bananas and sports drinks.  Take over-the-counter and prescription medicines only as told by your health care provider.   Prepare ORS according to the manufacturer's instructions. Take sips   of ORS every 5 minutes until your urine returns to normal.  If you have vomiting or diarrhea, continue to try to drink water, ORS, or both.   If you have diarrhea, avoid:   Beverages that contain caffeine.   Fruit juice.   Milk.   Carbonated soft drinks.  Do not take salt tablets. This can lead to the condition of having too much sodium in your body  (hypernatremia).  SEEK MEDICAL CARE IF:  You cannot eat or drink without vomiting.  You have had moderate diarrhea during a period of more than 24 hours.  You have a fever. SEEK IMMEDIATE MEDICAL CARE IF:   You have extreme thirst.  You have severe diarrhea.  You have not urinated in 6-8 hours, or you have urinated only a small amount of very dark urine.  You have shriveled skin.  You are dizzy, confused, or both.   This information is not intended to replace advice given to you by your health care provider. Make sure you discuss any questions you have with your health care provider.   Document Released: 07/26/2005 Document Revised: 04/16/2015 Document Reviewed: 12/11/2014 Elsevier Interactive Patient Education 2016 Elsevier Inc[employer]   Oral Mucositis Oral mucositis is a mouth condition that may develop from treatments for cancer. With this condition, sores may appear on your lips, gums, tongue, throat, and the top (roof) or bottom (floor) of your mouth. CAUSES Oral mucositis can happen to anyone who is being treated with cancer therapies, including:  Cancer medicines (chemotherapy).  Radiation therapy.  Bone marrow transplants and stem cell transplants. Cancer treatments can damage the lining of the mouth, and that causes this condition. Oral mucositis is not caused by infection. However, the sores can become infected after they form. Infection can make oral mucositis worse. RISK FACTORS This condition is more likely to develop in people:  With poor oral hygiene.  With dental problems or oral diseases.  Who use any tobacco product, including cigarettes, chewing tobacco, and electronic cigarettes.  Who drink alcohol.  Who have other medical conditions, such as diabetes, HIV, AIDS, or kidney disease.  Who do not drink enough clear fluids.  Who wear dentures that do not fit correctly.  Who have cancers that primarily affect the blood.  Who are  female. SYMPTOMS Symptoms can vary from mild to severe. Symptoms are usually seen 7-10 days after cancer treatment has started. Symptoms include:  Mouth sores. These sores may bleed.  Color changes inside the mouth. Red, shiny areas may appear.  White patches or pus in the mouth.  Pain in the mouth and throat. This can make it painful to speak.  Dryness and a burning feeling in the mouth.  Saliva that is dry and thick.  Trouble eating, drinking, and swallowing. This can lead to weight loss. DIAGNOSIS This condition can be diagnosed with a physical exam. TREATMENT Treatment depends on the severity of the condition. Oral mucositis often heals on its own. Sometimes, changes in the cancer treatment can help. Treatment may include medicines, such as:  An antibiotic medicine to fight infection, if present.  Medicine to help the cells in your mouth heal more quickly. Medicine may also be given to help control pain. This may include:  Pain relievers that are swished around in the mouth. These make the mouth numb to ease the pain (topical anesthetics).   Mouth rinses.  Prescribed, medicated gels. The gel coats the mouth. This protects nerve endings and lessens pain.  Narcotic pain medicines.   HOME CARE INSTRUCTIONS Medicines  If you were prescribed an antibiotic medicine, finish all of it even if you start to feel better.  Take or apply medicines only as directed by your health care provider. Lifestyle  Keeping your mouth clean and germ-free is important. To maintain good oral hygiene:  Brush your teeth carefully with a soft, nylon-bristled toothbrush at least two times each day. Use a gentle toothpaste. Ask your health care provider for a toothpaste recommendation.  Floss your teeth every day.  Have your teeth cleaned regularly as recommended by your dentist.  Rinse your mouth after every meal or as directed by your health care provider. Do not use mouthwash that contains  alcohol. Ask your health care provider for a mouthwash recommendation.  Do not use any tobacco products, including cigarettes, chewing tobacco, and electronic cigarettes. If you need help quitting, ask your health care provider.  Avoid eating:  Hot and spicy foods.  Citrus.  Foods that have sharp edges, such as chips.  Sugary foods, such as candy.  Do not drink alcohol. General Instructions  Clean the sores as directed by your health care provider.  If your lips are dry or cracked, apply a water-based moisturizer to your lips as needed.  Try sucking on ice chips or sugar-free frozen pops. This may help with pain. This also keeps your mouth moist.  Drink enough fluid to keep your urine clear or pale yellow.  If you are losing weight, talk with your health care provider.  If you have dentures, take them out often as directed by your health care provider.  Keep all follow-up visits as directed by your health care provider. This is important. SEEK MEDICAL CARE IF:  You have mouth pain or throat pain.  You are having more trouble swallowing.  Your symptoms get worse.  You have new symptoms.  Your pain is not controlled with medicine. SEEK IMMEDIATE MEDICAL CARE IF:  You have a lot of bleeding in your mouth.  You have trouble speaking.  You develop new, open, or draining sores in your mouth.  You cannot swallow solid food or liquids.  You have a fever.   This information is not intended to replace advice given to you by your health care provider. Make sure you discuss any questions you have with your health care provider.   Document Released: 03/12/2011 Document Revised: 12/10/2014 Document Reviewed: 07/22/2014 Elsevier Interactive Patient Education 2016 Elsevier Inc.  

## 2016-04-30 NOTE — Assessment & Plan Note (Signed)
Patient's potassium was down to 3.3 today.  Patient states that she has been given potassium tablets in the past; but does not feel that she would be comfortable swallowing them.  At this point.  Patient was given potassium 20 mEq IV today.  We'll recheck potassium when patient returns on Monday.

## 2016-04-30 NOTE — Telephone Encounter (Signed)
No 9/22 los/orders/referrals

## 2016-04-30 NOTE — Assessment & Plan Note (Addendum)
Patient continues to take reduced dose Revlimid oral therapy as directed.  Patient also continues to receive Zometa on an every three-month basis.  She is scheduled to return for labs and symptom management clinic visit for recheck on Monday, 05/03/2016.  She is scheduled for labs only on 06/30/2016.  She is scheduled for visit, and her next Zometa on 07/09/2016.

## 2016-04-30 NOTE — Assessment & Plan Note (Signed)
Patient states that she has had significant mouth and throat pain; and has decreased oral intake recently.  She feels dehydrated today.  She received IV fluid rehydration while at the cancer Center today.

## 2016-04-30 NOTE — Telephone Encounter (Signed)
Pt called to say she has been using MMW more than 4 times per day since Tuesday. Mouth is not any better, ears hurting. Feels worse.  Will come today at 10 for labs and then see Selena Lesser, NP

## 2016-05-03 ENCOUNTER — Ambulatory Visit (HOSPITAL_BASED_OUTPATIENT_CLINIC_OR_DEPARTMENT_OTHER): Payer: Medicare Other | Admitting: Nurse Practitioner

## 2016-05-03 ENCOUNTER — Other Ambulatory Visit: Payer: Self-pay | Admitting: *Deleted

## 2016-05-03 ENCOUNTER — Encounter: Payer: Self-pay | Admitting: Nurse Practitioner

## 2016-05-03 ENCOUNTER — Other Ambulatory Visit (HOSPITAL_BASED_OUTPATIENT_CLINIC_OR_DEPARTMENT_OTHER): Payer: Medicare Other

## 2016-05-03 VITALS — BP 128/66 | HR 67 | Temp 98.7°F | Resp 18 | Ht 60.25 in | Wt 113.2 lb

## 2016-05-03 DIAGNOSIS — C9001 Multiple myeloma in remission: Secondary | ICD-10-CM

## 2016-05-03 DIAGNOSIS — B3781 Candidal esophagitis: Secondary | ICD-10-CM

## 2016-05-03 DIAGNOSIS — E876 Hypokalemia: Secondary | ICD-10-CM

## 2016-05-03 DIAGNOSIS — E860 Dehydration: Secondary | ICD-10-CM

## 2016-05-03 DIAGNOSIS — B370 Candidal stomatitis: Secondary | ICD-10-CM

## 2016-05-03 DIAGNOSIS — B009 Herpesviral infection, unspecified: Secondary | ICD-10-CM | POA: Diagnosis not present

## 2016-05-03 LAB — COMPREHENSIVE METABOLIC PANEL
ALT: 16 U/L (ref 0–55)
AST: 13 U/L (ref 5–34)
Albumin: 3.4 g/dL — ABNORMAL LOW (ref 3.5–5.0)
Alkaline Phosphatase: 108 U/L (ref 40–150)
Anion Gap: 10 mEq/L (ref 3–11)
BUN: 6.3 mg/dL — ABNORMAL LOW (ref 7.0–26.0)
CO2: 23 mEq/L (ref 22–29)
Calcium: 8.8 mg/dL (ref 8.4–10.4)
Chloride: 111 mEq/L — ABNORMAL HIGH (ref 98–109)
Creatinine: 0.9 mg/dL (ref 0.6–1.1)
EGFR: 64 mL/min/{1.73_m2} — ABNORMAL LOW (ref >90)
Glucose: 89 mg/dl (ref 70–140)
Potassium: 2.8 mEq/L — CL (ref 3.5–5.1)
Sodium: 145 mEq/L (ref 136–145)
Total Bilirubin: 0.36 mg/dL (ref 0.20–1.20)
Total Protein: 6.6 g/dL (ref 6.4–8.3)

## 2016-05-03 LAB — CBC WITH DIFFERENTIAL/PLATELET
BASO%: 1.3 % (ref 0.0–2.0)
Basophils Absolute: 0.1 10*3/uL (ref 0.0–0.1)
EOS%: 4.2 % (ref 0.0–7.0)
Eosinophils Absolute: 0.2 10*3/uL (ref 0.0–0.5)
HCT: 30.6 % — ABNORMAL LOW (ref 34.8–46.6)
HGB: 10 g/dL — ABNORMAL LOW (ref 11.6–15.9)
LYMPH%: 23.1 % (ref 14.0–49.7)
MCH: 29.9 pg (ref 25.1–34.0)
MCHC: 32.7 g/dL (ref 31.5–36.0)
MCV: 91.6 fL (ref 79.5–101.0)
MONO#: 0.6 10*3/uL (ref 0.1–0.9)
MONO%: 11.9 % (ref 0.0–14.0)
NEUT#: 3.1 10*3/uL (ref 1.5–6.5)
NEUT%: 59.5 % (ref 38.4–76.8)
Platelets: 236 10*3/uL (ref 145–400)
RBC: 3.34 10*6/uL — ABNORMAL LOW (ref 3.70–5.45)
RDW: 15.1 % — ABNORMAL HIGH (ref 11.2–14.5)
WBC: 5.2 10*3/uL (ref 3.9–10.3)
lymph#: 1.2 10*3/uL (ref 0.9–3.3)

## 2016-05-03 MED ORDER — LENALIDOMIDE 5 MG PO CAPS: 5.0000 mg | ORAL_CAPSULE | Freq: Every day | ORAL | 0 refills | Status: DC

## 2016-05-03 MED ORDER — ACETAMINOPHEN 325 MG PO TABS
ORAL_TABLET | ORAL | Status: AC
  Filled 2016-05-03: qty 2

## 2016-05-03 MED ORDER — ACETAMINOPHEN 500 MG PO TABS
ORAL_TABLET | ORAL | Status: AC
  Filled 2016-05-03: qty 2

## 2016-05-03 MED ORDER — ACETAMINOPHEN 325 MG PO TABS
650.0000 mg | ORAL_TABLET | Freq: Once | ORAL | Status: AC
  Administered 2016-05-03: 650 mg via ORAL

## 2016-05-03 MED ORDER — ACYCLOVIR 5 % EX OINT: 1.0000 "application " | TOPICAL_OINTMENT | CUTANEOUS | 1 refills | Status: DC

## 2016-05-03 MED ORDER — POTASSIUM CHLORIDE CRYS ER 20 MEQ PO TBCR: EXTENDED_RELEASE_TABLET | ORAL | 0 refills | Status: DC

## 2016-05-03 MED ORDER — SODIUM CHLORIDE 0.9 % IV SOLN
INTRAVENOUS | Status: AC
  Administered 2016-05-03: 11:00:00 via INTRAVENOUS
  Filled 2016-05-03: qty 1000

## 2016-05-03 MED ORDER — POTASSIUM CHLORIDE CRYS ER 20 MEQ PO TBCR
20.0000 meq | EXTENDED_RELEASE_TABLET | Freq: Once | ORAL | Status: AC
  Administered 2016-05-03: 20 meq via ORAL
  Filled 2016-05-03: qty 1

## 2016-05-03 NOTE — Assessment & Plan Note (Signed)
Patient states she is feeling much better today than last Friday.  She states she has been able to take in some fluids and a little bit of soup.  She feels that her throat is slightly improved as well.  However, patient still feels dehydrated; and is requesting IV fluid rehydration again.

## 2016-05-03 NOTE — Assessment & Plan Note (Signed)
Patient's cold sore to her bottom lip has greatly improved.  It remains scabbed at this time.  She states that when she moves her mouth.  It cracks somewhat.  Patient will be given a prescription for acyclovir cream to apply to the site.  She also continues to take the acyclovir orally as previously directed.

## 2016-05-03 NOTE — Assessment & Plan Note (Signed)
Patient states that her tongue and mouth; as well as her throat is slightly improved.  She continues to take the Diflucan orally as directed.  She states that she's able to take in some oral intake now.  She still feels dehydrated today; and would prefer to receive additional IV fluid rehydration.  Exam today reveals thrush to tongue is slightly improved.  No obvious oral esophagitis on exam.  Patient managing all oral secretions well.  Patient will receive additional IV fluid rehydration while the cancer Center today.

## 2016-05-03 NOTE — Assessment & Plan Note (Signed)
Patient continues to take reduced dose Revlimid oral therapy as directed.  Patient also continues to receive Zometa on an every three-month basis.  She is scheduled for labs only on 06/30/2016.  She is scheduled for visit, and her next Zometa on 07/09/2016.

## 2016-05-03 NOTE — Assessment & Plan Note (Signed)
Patient's potassium is decreased now down from 3.3 down to 2.8.  Patient will receive potassium 20 mEq IV today; as well as potassium 20 mEq orally.  She will also be given a prescription for potassium to start tomorrow with instructions to take 20 mg and potassium twice daily for 3 days; and then to decrease down to once daily until gone.  Patient was advised that she may need to have her potassium level rechecked at the end of this week if she continues with poor oral intake.

## 2016-05-03 NOTE — Progress Notes (Signed)
SYMPTOM MANAGEMENT CLINIC    Chief Complaint: Esophagitis, dehydration, hypokalemia  HPI:  Jessica [name] 65 y.o. female diagnosed with multiple myeloma.  Currently undergoing oral Revlimid and Zometa.   Oncology History    M-protein 0.69 gm/dl IFIX - IgG, Kappa IgG - 868 IgA - 19 IgM - < 20 Kappa - 21 Lambda - 5.7  09/06/2014 - Bone marrow aspirate and biopsy:   Normocellular marrow for age (40%) with a small monoclonal plasma cell population (1% on aspirate). Karyotype 109, XX  FISH Negative for myeloma associated changes  09/12/2014 - PET/CT  Two regions that are concerning for disease, one adjacent/involving the left ninth rib and one in the marrow of the right femur, in this patient with history of plasmacytoma.      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      09/06/2014 Bone Marrow Biopsy    BM performed at [facility] is not consistent with multiple myeloma, 1% plasma cell on aspirate      09/12/2014 Imaging     PET CT scan show involvement of left ninth rib and right femur      09/23/2014 - 10/23/2014 Radiation Therapy     she had radiation therapy to the cavernous sinus and skull base lesions, 45 Gy      10/21/2014 - 11/01/2014 Radiation Therapy     she had radiation to right femur , total 30 Gy      11/26/2014 - 02/14/2015 Chemotherapy     she is started on weekly dexamethasone, Velcade twice a week on day 1, 4, 8 and 11 and Revlimid days 1-14.      04/01/2015 Bone Marrow Transplant    She received melphalan chemotherapy on 03/31/2015 followed by autologous stem cell transplant the day  after      04/03/2015 - 04/18/2015 Hospital Admission    The patient was admitted to the hospital at [place] for management related to complication from stem cell transplant. She had significant nausea requiring intravenous anti-emetics.      07/17/2015 -  Chemotherapy    She started maintenance Revlimid and monthly zometa       Review of Systems  Constitutional: Positive for malaise/fatigue and weight loss.  HENT: Positive for sore throat.   Gastrointestinal: Positive for nausea.  Neurological: Positive for weakness.  All other systems reviewed and are negative.   Past Medical History:  Diagnosis Date  . H/O stem cell transplant [facility]) 03/2015   [facility]  . Multiple myeloma ([place]) 11/19/2014  . Multiple myeloma ([place])   . Multiple myeloma ([place])   . MVP (mitral valve prolapse)    req prophylaxis  . Other constipation 12/03/2014  . Right ovarian cyst 10/16/15   16 mm simple cyst - ultrasound yearly.  Marland Kitchen Upper respiratory infection, acute 08/12/2015    Past Surgical History:  Procedure Laterality Date  . CATARACT EXTRACTION, BILATERAL  10/2010   Implants([employer]ST[employer]) Bilat  . LASER ABLATION OF CONDYLOMAS  1990   CIN 1 cervix  . multiple myeloma surgery Right 08/2014   [facility]    has Multiple  myeloma in remission ([place]); Prerenal renal failure; 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[place]); Neuropathic pain of left flank; Dehydration; Weakness; Other constipation; Superficial thrombophlebitis of upper extremity; Sty, external; Infection of eyelid; Protein calorie malnutrition ([place]); Neuropathy due to chemotherapeutic drug ([place]); Essential hypertension; Petechiae; S/P bone marrow transplant ([place]); Hypotension due to drugs; Varicose vein of leg; Nasal congestion; Hypokalemia, gastrointestinal losses; Chronic kidney disease, stage III (moderate); Candidal esophagitis ([place]); and Herpes simplex on her problem list.    is allergic to albuterol; codeine;  ibuprofen; nsaids; and augmentin [amoxicillin-pot clavulanate].    Medication List       Accurate as of 05/03/16 11:02 AM. Always use your most recent med list.          acyclovir 800 MG tablet Commonly known as:  ZOVIRAX Take 1 tablet (800 mg total) by mouth 2 (two) times daily.   acyclovir ointment 5 % Commonly known as:  ZOVIRAX Apply 1 application topically every 3 (three) hours.   aspirin EC 81 MG tablet Take 81 mg by mouth daily with breakfast.   calcium carbonate 1500 (600 Ca) MG Tabs tablet Commonly known as:  OSCAL Take 1,500 mg by mouth daily with breakfast.   CENTRUM SILVER PO Take by mouth.   fluconazole 100 MG tablet Commonly known as:  DIFLUCAN Take 1 tablet (100 mg total) by mouth daily.   fluticasone 50 MCG/ACT nasal spray Commonly known as:  FLONASE Place 1 spray into both nostrils 2 (two) times daily.   gabapentin 300 MG capsule Commonly known as:  NEURONTIN Take 1 capsule (300 mg total) by mouth at bedtime.   HYDROcodone-acetaminophen 7.5-325 mg/15 ml solution Commonly known as:  HYCET Take 10 mLs by mouth 4 (four) times daily as needed for moderate pain.   lenalidomide 5 MG capsule Commonly known as:  REVLIMID Take 1 capsule (5 mg total) by mouth daily.   magic mouthwash w/lidocaine Soln Take 5 mLs by mouth 4 (four) times daily as needed for mouth pain.   magic mouthwash w/lidocaine Soln Take 5 mLs by mouth 4 (four) times daily as needed for mouth pain.   omeprazole 20 MG capsule Commonly known as:  PRILOSEC take 1 capsule by mouth once daily   ondansetron 8 MG tablet Commonly known as:  ZOFRAN Take 1 tablet (8 mg total) by mouth every 8 (eight) hours as needed for nausea or vomiting.   oxyCODONE 5 MG immediate release tablet Commonly known as:  Oxy IR/ROXICODONE Take 1 tablet by mouth.   potassium chloride SA 20 MEQ tablet Commonly known as:  K-DUR,KLOR-CON Take 1 tab PO BID x 3 days; then decrease to 1 tab PO QD till gone.     senna-docusate 8.6-50 MG tablet Commonly known as:  Senokot-S Take 2 tablets by mouth once as needed.   Vitamin D3 2000 units capsule Take 1 capsule by mouth daily.        PHYSICAL EXAMINATION  Oncology Vitals 05/03/2016 04/30/2016  Height 153 cm -  Weight 51.347 kg -  Weight (lbs) 113 lbs 3 oz -  BMI (kg/m2) 21.92 kg/m2 -  Temp 98.7 98.5  Pulse 67 80  Resp 18 18  SpO2 100 97  BSA (m2) 1.48 m2 -   BP Readings from Last 2 Encounters:  05/03/16 128/66  04/30/16 136/80    Physical Exam  Constitutional: She is oriented to person, place, and time. Vital signs are normal. She appears malnourished and dehydrated. She appears unhealthy. She appears cachectic.  HENT:  Head: Normocephalic and atraumatic.  Right Ear: External ear normal.  Left Ear: External ear normal.  Exam today reveals obvious thrush to the tongue.  There is also some oral lesions to the inside of patient's lower lip and to the lower gums as well.  Posterior oropharynx appears clear with no exudate or thrush.  Also, patient has a cold sore that is slowly healing to the lower lip as well.    Eyes: Conjunctivae and EOM are normal. Pupils are equal, round, and reactive to light. Right eye exhibits no discharge. Left eye exhibits no discharge. No scleral icterus.  Neck: Normal range of motion. Neck supple.  Pulmonary/Chest: Effort normal. No respiratory distress.  Musculoskeletal: Normal range of motion. She exhibits no edema or tenderness.  Neurological: She is alert and oriented to person, place, and time. Gait normal.  Skin: Skin is warm and dry. No rash noted. No erythema. There is pallor.  Psychiatric: Affect normal.  Nursing note and vitals reviewed.   LABORATORY DATA:. Appointment on 05/03/2016  Component Date Value Ref Range Status  . WBC 05/03/2016 5.2  3.9 - 10.3 10e3/uL Final  . NEUT# 05/03/2016 3.1  1.5 - 6.5 10e3/uL Final  . HGB 05/03/2016 10.0* 11.6 - 15.9 g/dL Final  . HCT 05/03/2016 30.6*  34.8 - 46.6 % Final  . Platelets 05/03/2016 236  145 - 400 10e3/uL Final  . MCV 05/03/2016 91.6  79.5 - 101.0 fL Final  . MCH 05/03/2016 29.9  25.1 - 34.0 pg Final  . MCHC 05/03/2016 32.7  31.5 - 36.0 g/dL Final  . RBC 05/03/2016 3.34* 3.70 - 5.45 10e6/uL Final  . RDW 05/03/2016 15.1* 11.2 - 14.5 % Final  . lymph# 05/03/2016 1.2  0.9 - 3.3 10e3/uL Final  . MONO# 05/03/2016 0.6  0.1 - 0.9 10e3/uL Final  . Eosinophils Absolute 05/03/2016 0.2  0.0 - 0.5 10e3/uL Final  . Basophils Absolute 05/03/2016 0.1  0.0 - 0.1 10e3/uL Final  . NEUT% 05/03/2016 59.5  38.4 - 76.8 % Final  . LYMPH% 05/03/2016 23.1  14.0 - 49.7 % Final  . MONO% 05/03/2016 11.9  0.0 - 14.0 % Final  . EOS% 05/03/2016 4.2  0.0 - 7.0 % Final  . BASO% 05/03/2016 1.3  0.0 - 2.0 % Final  . Sodium 05/03/2016 145  136 - 145 mEq/L Final  . Potassium 05/03/2016 2.8* 3.5 - 5.1 mEq/L Final  . Chloride 05/03/2016 111* 98 - 109 mEq/L Final  . CO2 05/03/2016 23  22 - 29 mEq/L Final  . Glucose 05/03/2016 89  70 - 140 mg/dl Final  . BUN 05/03/2016 6.3* 7.0 - 26.0 mg/dL Final  . Creatinine 05/03/2016 0.9  0.6 - 1.1 mg/dL Final  . Total Bilirubin 05/03/2016 0.36  0.20 - 1.20 mg/dL Final  . Alkaline Phosphatase 05/03/2016 108  40 - 150 U/L Final  . AST 05/03/2016 13  5 - 34 U/L Final  . ALT 05/03/2016 16  0 - 55 U/L Final  . Total Protein 05/03/2016 6.6  6.4 - 8.3 g/dL Final  . Albumin 05/03/2016 3.4* 3.5 - 5.0 g/dL Final  . Calcium 05/03/2016 8.8  8.4 - 10.4 mg/dL Final  . Anion Gap 05/03/2016 10  3 - 11 mEq/L Final  . EGFR 05/03/2016 64* >90 ml/min/1.73 m2 Final    RADIOGRAPHIC STUDIES: No results found.  ASSESSMENT/PLAN:    Multiple myeloma in remission [facility]) Patient continues to take reduced dose Revlimid oral therapy as directed.  Patient also continues to receive  Zometa on an every three-month basis.  She is scheduled for labs only on 06/30/2016.  She is scheduled for visit, and her next Zometa on 07/09/2016.  Hypokalemia,  gastrointestinal losses Patient's potassium is decreased now down from 3.3 down to 2.8.  Patient will receive potassium 20 mEq IV today; as well as potassium 20 mEq orally.  She will also be given a prescription for potassium to start tomorrow with instructions to take 20 mg and potassium twice daily for 3 days; and then to decrease down to once daily until gone.  Patient was advised that she may need to have her potassium level rechecked at the end of this week if she continues with poor oral intake.  Herpes simplex Patient's cold sore to her bottom lip has greatly improved.  It remains scabbed at this time.  She states that when she moves her mouth.  It cracks somewhat.  Patient will be given a prescription for acyclovir cream to apply to the site.  She also continues to take the acyclovir orally as previously directed.  Dehydration Patient states she is feeling much better today than last Friday.  She states she has been able to take in some fluids and a little bit of soup.  She feels that her throat is slightly improved as well.  However, patient still feels dehydrated; and is requesting IV fluid rehydration again.  Candidal esophagitis ([place]) Patient states that her tongue and mouth; as well as her throat is slightly improved.  She continues to take the Diflucan orally as directed.  She states that she's able to take in some oral intake now.  She still feels dehydrated today; and would prefer to receive additional IV fluid rehydration.  Exam today reveals thrush to tongue is slightly improved.  No obvious oral esophagitis on exam.  Patient managing all oral secretions well.  Patient will receive additional IV fluid rehydration while the cancer Center today.   Patient stated understanding of all instructions; and was in agreement with this plan of care. The patient knows to call the clinic with any problems, questions or concerns.   Total time spent with patient was 25 minutes;  with greater  than 75 percent of that time spent in face to face counseling regarding patient's symptoms,  and coordination of care and follow up.  Disclaimer:This dictation was prepared with Dragon/digital dictation along with [employer]. Any transcriptional errors that result from this process are unintentional.  Drue Second, NP 05/03/2016

## 2016-05-06 ENCOUNTER — Encounter: Payer: Self-pay | Admitting: Pharmacist

## 2016-05-06 MED FILL — MAGIC MOUTHWASH W/LIDO 1:1: 6 days supply | Qty: 120 | Fill #1

## 2016-05-06 NOTE — Progress Notes (Signed)
Oral Chemotherapy Pharmacist Encounter   Received call from [facility] in reference to dispense quantity of Revlimid for Rx sent in 9/25. Patient's Revlimid schedule has changed to 21 days on and 7 days off, so patient has 7 capsules remaining from last fill.   Per [facility], patient requested that they only send 14 capsules with next fill so she does not end up with extra capsules around the house.  Verbal order given to [employer] CPhT to change dispense quantity from 21 capsules to 14 capsules for this fill only.  Johny Drilling, PharmD, BCPS 05/06/2016  1:54 PM Oral Chemotherapy Clinic [phone]

## 2016-05-11 ENCOUNTER — Telehealth: Payer: Self-pay | Admitting: *Deleted

## 2016-05-11 NOTE — Telephone Encounter (Signed)
TC to patient to follow up with here re: mucositis, oral thrush and esophagitis. Spoke with patient and she states she is doing much better. She is eating and drinking well.  There are still some areas of tenderness in her mouth and throat but she is using magic mouthwash with relief.  She has 5 more days of Difucan-advised her to complete this. She voiced understanding. Encouraged her to call back with any further difficulties. She verbalized understanding and expressed her appreciation for f/u call.

## 2016-05-13 ENCOUNTER — Telehealth: Payer: Self-pay | Admitting: *Deleted

## 2016-05-13 DIAGNOSIS — C9001 Multiple myeloma in remission: Secondary | ICD-10-CM

## 2016-05-13 DIAGNOSIS — E860 Dehydration: Secondary | ICD-10-CM

## 2016-05-13 MED ORDER — ACYCLOVIR 800 MG PO TABS: 800.0000 mg | ORAL_TABLET | Freq: Two times a day (BID) | ORAL | 1 refills | Status: DC

## 2016-05-13 NOTE — Telephone Encounter (Signed)
Informed pt of Acyclovir refilled by Dr. Alvy Bimler and Rx sent to [facility].  Confirmed her appts for lab 11/22 and MD/ Zometa 12/1.   Instructed her to call us if any new problems or concerns prior to next appt. She verbalized understanding.

## 2016-05-13 NOTE — Telephone Encounter (Signed)
Pt asks for refill on Acyclovir 800 mg BID.  She says she felt like she started getting mouth sores when it was discontinued before.  She thinks her mouth sores are better since she restarted it and asks if Dr. Alvy Bimler will refill to [facility].

## 2016-05-20 MED FILL — MAGIC MOUTHWASH W/LIDO 1:1: 6 days supply | Qty: 120 | Fill #2

## 2016-05-28 ENCOUNTER — Telehealth: Payer: Self-pay | Admitting: *Deleted

## 2016-05-28 MED ORDER — LENALIDOMIDE 5 MG PO CAPS: 5.0000 mg | ORAL_CAPSULE | Freq: Every day | ORAL | 0 refills | Status: DC

## 2016-05-28 NOTE — Telephone Encounter (Signed)
Pt left VM requesting refill on Revlimid.  She took last one 2 days ago and starts next cycle on Wed 11/25.   Refill sent electronically to [facility].

## 2016-06-01 ENCOUNTER — Telehealth: Payer: Self-pay | Admitting: *Deleted

## 2016-06-01 NOTE — Telephone Encounter (Signed)
Pt called earlier states has not heard from [facility] about her Revlimid refill.   Asked pt if she has contacted [facility] to check on refill?  I sent Rx to [facility] last week.   Instructed pt to call us back if they do not have refill or any problems w/ it.  She will call them and call us back if any problems.

## 2016-06-07 ENCOUNTER — Telehealth: Payer: Self-pay | Admitting: *Deleted

## 2016-06-07 NOTE — Telephone Encounter (Signed)
Spoke with patient. Called to review bone density results and recommendations per Dr. Elza Rafter request.  Patient states she already has appointment with Dr. Alvy Bimler scheduled for 07/09/16. Patient states she has received a copy of bone density results and was recommended by PCP Dr. Inda Merlin to follow-up with Dr. Alvy Bimler as well. Advised patient if any additional questions/concerns call office. Patient thankful and verbalizes understanding.  Dr. Quincy Simmonds, will send bone density results to scan, any additional recommendations?

## 2016-06-07 NOTE — Telephone Encounter (Signed)
You may close the encounter.

## 2016-06-17 ENCOUNTER — Encounter: Payer: Self-pay | Admitting: Obstetrics and Gynecology

## 2016-06-24 ENCOUNTER — Telehealth: Payer: Self-pay | Admitting: *Deleted

## 2016-06-24 ENCOUNTER — Other Ambulatory Visit: Payer: Self-pay | Admitting: *Deleted

## 2016-06-24 MED ORDER — LENALIDOMIDE 5 MG PO CAPS: 5.0000 mg | ORAL_CAPSULE | Freq: Every day | ORAL | 0 refills | Status: DC

## 2016-06-24 NOTE — Telephone Encounter (Signed)
Pt left VM this morning requesting refill Revlimid. She took last pill yesterday.  Refill sent to [facility]. Notified pt.  Instructed her to call [facility] if she does not hear anything from them by Monday. She verbalized understanding.

## 2016-06-30 ENCOUNTER — Other Ambulatory Visit (HOSPITAL_BASED_OUTPATIENT_CLINIC_OR_DEPARTMENT_OTHER): Payer: Medicare Other

## 2016-06-30 DIAGNOSIS — C9001 Multiple myeloma in remission: Secondary | ICD-10-CM

## 2016-06-30 LAB — COMPREHENSIVE METABOLIC PANEL
ALT: 17 U/L (ref 0–55)
AST: 19 U/L (ref 5–34)
Albumin: 3.4 g/dL — ABNORMAL LOW (ref 3.5–5.0)
Alkaline Phosphatase: 101 U/L (ref 40–150)
Anion Gap: 8 mEq/L (ref 3–11)
BUN: 13.4 mg/dL (ref 7.0–26.0)
CO2: 22 mEq/L (ref 22–29)
Calcium: 9.1 mg/dL (ref 8.4–10.4)
Chloride: 113 mEq/L — ABNORMAL HIGH (ref 98–109)
Creatinine: 0.8 mg/dL (ref 0.6–1.1)
EGFR: 73 mL/min/{1.73_m2} — ABNORMAL LOW (ref >90)
Glucose: 84 mg/dl (ref 70–140)
Potassium: 4.1 mEq/L (ref 3.5–5.1)
Sodium: 143 mEq/L (ref 136–145)
Total Bilirubin: 0.32 mg/dL (ref 0.20–1.20)
Total Protein: 6.2 g/dL — ABNORMAL LOW (ref 6.4–8.3)

## 2016-06-30 LAB — CBC WITH DIFFERENTIAL/PLATELET
BASO%: 1.8 % (ref 0.0–2.0)
Basophils Absolute: 0.1 10*3/uL (ref 0.0–0.1)
EOS%: 4.2 % (ref 0.0–7.0)
Eosinophils Absolute: 0.1 10*3/uL (ref 0.0–0.5)
HCT: 28.9 % — ABNORMAL LOW (ref 34.8–46.6)
HGB: 9.3 g/dL — ABNORMAL LOW (ref 11.6–15.9)
LYMPH%: 33.4 % (ref 14.0–49.7)
MCH: 28.8 pg (ref 25.1–34.0)
MCHC: 32.2 g/dL (ref 31.5–36.0)
MCV: 89.5 fL (ref 79.5–101.0)
MONO#: 0.5 10*3/uL (ref 0.1–0.9)
MONO%: 13.6 % (ref 0.0–14.0)
NEUT#: 1.6 10*3/uL (ref 1.5–6.5)
NEUT%: 47 % (ref 38.4–76.8)
Platelets: 215 10*3/uL (ref 145–400)
RBC: 3.23 10*6/uL — ABNORMAL LOW (ref 3.70–5.45)
RDW: 15.8 % — ABNORMAL HIGH (ref 11.2–14.5)
WBC: 3.3 10*3/uL — ABNORMAL LOW (ref 3.9–10.3)
lymph#: 1.1 10*3/uL (ref 0.9–3.3)

## 2016-07-02 LAB — KAPPA/LAMBDA LIGHT CHAINS
Ig Kappa Free Light Chain: 3.7 mg/L (ref 3.3–19.4)
Ig Lambda Free Light Chain: 2.9 mg/L — ABNORMAL LOW (ref 5.7–26.3)
Kappa/Lambda FluidC Ratio: 1.28 (ref 0.26–1.65)

## 2016-07-05 LAB — MULTIPLE MYELOMA PANEL, SERUM
Albumin SerPl Elph-Mcnc: 3.5 g/dL (ref 2.9–4.4)
Albumin/Glob SerPl: 1.5 (ref 0.7–1.7)
Alpha 1: 0.3 g/dL (ref 0.0–0.4)
Alpha2 Glob SerPl Elph-Mcnc: 0.9 g/dL (ref 0.4–1.0)
B-Globulin SerPl Elph-Mcnc: 1 g/dL (ref 0.7–1.3)
Gamma Glob SerPl Elph-Mcnc: 0.3 g/dL — ABNORMAL LOW (ref 0.4–1.8)
Globulin, Total: 2.4 g/dL (ref 2.2–3.9)
IgA, Qn, Serum: 7 mg/dL — ABNORMAL LOW (ref 87–352)
IgG, Qn, Serum: 301 mg/dL — ABNORMAL LOW (ref 700–1600)
IgM, Qn, Serum: 5 mg/dL — ABNORMAL LOW (ref 26–217)
Total Protein: 5.9 g/dL — ABNORMAL LOW (ref 6.0–8.5)

## 2016-07-09 ENCOUNTER — Telehealth: Payer: Self-pay | Admitting: *Deleted

## 2016-07-09 ENCOUNTER — Ambulatory Visit (HOSPITAL_BASED_OUTPATIENT_CLINIC_OR_DEPARTMENT_OTHER): Payer: Medicare Other

## 2016-07-09 ENCOUNTER — Ambulatory Visit (HOSPITAL_BASED_OUTPATIENT_CLINIC_OR_DEPARTMENT_OTHER): Payer: Medicare Other | Admitting: Hematology and Oncology

## 2016-07-09 ENCOUNTER — Encounter: Payer: Self-pay | Admitting: Hematology and Oncology

## 2016-07-09 ENCOUNTER — Telehealth: Payer: Self-pay | Admitting: Hematology and Oncology

## 2016-07-09 ENCOUNTER — Other Ambulatory Visit: Payer: Self-pay | Admitting: Hematology and Oncology

## 2016-07-09 DIAGNOSIS — C9001 Multiple myeloma in remission: Secondary | ICD-10-CM

## 2016-07-09 DIAGNOSIS — Z9481 Bone marrow transplant status: Secondary | ICD-10-CM

## 2016-07-09 DIAGNOSIS — K1231 Oral mucositis (ulcerative) due to antineoplastic therapy: Secondary | ICD-10-CM | POA: Diagnosis not present

## 2016-07-09 DIAGNOSIS — D61818 Other pancytopenia: Secondary | ICD-10-CM

## 2016-07-09 MED ORDER — ACETAMINOPHEN 325 MG PO TABS
ORAL_TABLET | ORAL | Status: AC
  Filled 2016-07-09: qty 2

## 2016-07-09 MED ORDER — MAGIC MOUTHWASH: 10.0000 mL | Freq: Four times a day (QID) | ORAL | 1 refills | Status: DC | PRN

## 2016-07-09 MED ORDER — SODIUM CHLORIDE 0.9 % IV SOLN
INTRAVENOUS | Status: DC
  Administered 2016-07-09: 14:00:00 via INTRAVENOUS

## 2016-07-09 MED ORDER — ACETAMINOPHEN 325 MG PO TABS
650.0000 mg | ORAL_TABLET | Freq: Once | ORAL | Status: AC
  Administered 2016-07-09: 650 mg via ORAL

## 2016-07-09 MED ORDER — ZOLEDRONIC ACID 4 MG/5ML IV CONC
3.0000 mg | Freq: Once | INTRAVENOUS | Status: AC
  Administered 2016-07-09: 3 mg via INTRAVENOUS
  Filled 2016-07-09: qty 3.75

## 2016-07-09 MED FILL — CMPD MMW LID:NYS:DPH:MAX: 12 days supply | Qty: 480 | Fill #0

## 2016-07-09 NOTE — Assessment & Plan Note (Signed)
She has anemia and leukopenia from treatment. She is not symptomatic except for fatigue. The patient does have recurrent mucositis. After she completes current cycle, I recommend she take 2-3 weeks but before resumption. She agreed with the plan of care

## 2016-07-09 NOTE — Telephone Encounter (Signed)
Appointments scheduled per 12/1 LOS. Patient given AVS report and calendars with future scheduled appointments. °

## 2016-07-09 NOTE — Assessment & Plan Note (Signed)
The patient is doing well s/p bone marrow transplant. She will continue antimicrobials prophylaxis as directed by [facility]. The patient is returning to [facility] in February for follow-up. I will see her in March and review test results from [facility]

## 2016-07-09 NOTE — Patient Instructions (Signed)

## 2016-07-09 NOTE — Assessment & Plan Note (Signed)
She is currently receiving Zometa and Revlimid She had mild reaction to Zometa previously with some bone pain and hives, but doing better with reduced dose at 3 mg. We will continue to same dose.  She will continue calcium and vitamin D supplements along with aspirin. Recent blood work suggested that she is in complete remission. The patient is pancytopenic. She will be completing Revlimid in a few days. After she completes the treatment, I recommend she takes 2 weeks break to allow recovery of her blood count before she resume Revlimid.

## 2016-07-09 NOTE — Telephone Encounter (Signed)
MMW called into [facility]

## 2016-07-09 NOTE — Assessment & Plan Note (Signed)
She has mild mucositis and felt it is causing some pain when she tries to eat. I recommend another trial of Magic mouthwash and treatment break

## 2016-07-09 NOTE — Progress Notes (Signed)
[facility] OFFICE PROGRESS NOTE  Patient Care Team: Josetta Huddle, MD as PCP - General (Internal Medicine) Ginette Pitman, MD as Consulting Physician (Hematology and Oncology) Hessie Dibble, MD as Consulting Physician (Hematology and Oncology)  SUMMARY OF ONCOLOGIC HISTORY: Oncology History    M-protein 0.69 gm/dl IFIX - IgG, Kappa IgG - 868 IgA - 19 IgM - < 20 Kappa - 21 Lambda - 5.7  09/06/2014 - Bone marrow aspirate and biopsy:   Normocellular marrow for age (40%) with a small monoclonal plasma cell population (1% on aspirate). Karyotype 60, XX  FISH Negative for myeloma associated changes  09/12/2014 - PET/CT  Two regions that are concerning for disease, one adjacent/involving the left ninth rib and one in the marrow of the right femur, in this patient with history of plasmacytoma.      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      09/06/2014 Bone Marrow Biopsy    BM performed at [facility] is not consistent with multiple myeloma, 1% plasma cell on aspirate      09/12/2014 Imaging     PET CT scan show involvement of left ninth rib and right femur      09/23/2014 - 10/23/2014 Radiation Therapy     she had radiation therapy to the cavernous sinus and skull base lesions, 45 Gy      10/21/2014 - 11/01/2014 Radiation Therapy     she had radiation to right femur , total 30 Gy      11/26/2014 - 02/14/2015 Chemotherapy     she is started on weekly dexamethasone, Velcade twice a week on day 1, 4, 8 and 11 and Revlimid days 1-14.      04/01/2015 Bone Marrow Transplant    She received  melphalan chemotherapy on 03/31/2015 followed by autologous stem cell transplant the day after      04/03/2015 - 04/18/2015 Hospital Admission    The patient was admitted to the hospital at [facility] for management related to complication from stem cell transplant. She had significant nausea requiring intravenous anti-emetics.      07/17/2015 -  Chemotherapy    She started maintenance Revlimid and monthly zometa       INTERVAL HISTORY: Please see below for problem oriented charting. She returns with her husband. She complained of intermittent mucositis. Denies recent fever or chills or need for antibiotic treatment. She denies recent diarrhea. She complained of mild joint achiness. She had repeat bone density scan which show persistent osteoporosis She takes calcium and vitamin D supplement. She denies recent dental issues. The patient denies any recent signs or symptoms of bleeding such as spontaneous epistaxis, hematuria or hematochezia.  REVIEW OF SYSTEMS:   Constitutional: Denies fevers, chills or abnormal weight loss Eyes: Denies blurriness of vision Respiratory: Denies cough, dyspnea or wheezes Cardiovascular: Denies palpitation, chest discomfort or lower extremity swelling Gastrointestinal:  Denies nausea, heartburn or change in bowel habits Skin: Denies abnormal skin rashes Lymphatics: Denies new lymphadenopathy or easy bruising Neurological:Denies numbness, tingling or new weaknesses Behavioral/Psych: Mood is stable, no new changes  All other systems  were reviewed with the patient and are negative.  I have reviewed the past medical history, past surgical history, social history and family history with the patient and they are unchanged from previous note.  ALLERGIES:  is allergic to albuterol; codeine; ibuprofen; nsaids; and augmentin [amoxicillin-pot clavulanate].  MEDICATIONS:  Current Outpatient Prescriptions  Medication Sig Dispense Refill  . acyclovir (ZOVIRAX)  800 MG tablet Take 1 tablet (800 mg total) by mouth 2 (two) times daily. 60 tablet 1  . acyclovir ointment (ZOVIRAX) 5 % Apply 1 application topically every 3 (three) hours. 15 g 1  . aspirin EC 81 MG tablet Take 81 mg by mouth daily with breakfast.    . calcium carbonate (OSCAL) 1500 (600 Ca) MG TABS tablet Take 1,500 mg by mouth daily with breakfast.    . Cholecalciferol (VITAMIN D3) 2000 units capsule Take 1 capsule by mouth daily.    . fluticasone (FLONASE) 50 MCG/ACT nasal spray Place 1 spray into both nostrils 2 (two) times daily. 16 g 2  . HYDROcodone-acetaminophen (HYCET) 7.5-325 mg/15 ml solution Take 10 mLs by mouth 4 (four) times daily as needed for moderate pain. 120 mL 0  . lenalidomide (REVLIMID) 5 MG capsule Take 1 capsule (5 mg total) by mouth daily. X 21 days on and 7 days off. 21 capsule 0  . Multiple Vitamins-Minerals (CENTRUM SILVER PO) Take by mouth.    Marland Kitchen omeprazole (PRILOSEC) 20 MG capsule take 1 capsule by mouth once daily 30 capsule 6  . ondansetron (ZOFRAN) 8 MG tablet Take 1 tablet (8 mg total) by mouth every 8 (eight) hours as needed for nausea or vomiting. 60 tablet 3  . senna-docusate (SENOKOT-S) 8.6-50 MG tablet Take 2 tablets by mouth once as needed.     No current facility-administered medications for this visit.     PHYSICAL EXAMINATION: ECOG PERFORMANCE STATUS: 1 - Symptomatic but completely ambulatory  Vitals:   07/09/16 1312 07/09/16 1316  BP: (!) 115/98 135/86  Pulse: 93   Resp: 17   Temp: 99.8 F (37.7 C)    Filed Weights   07/09/16 1312  Weight: 118 lb 6.4 oz (53.7 kg)    GENERAL:alert, no distress and comfortable SKIN: skin color, texture, turgor are normal, no rashes or significant lesions EYES: normal, Conjunctiva are pink and non-injected, sclera clear OROPHARYNX:Noted mild mucositis NECK: supple, thyroid normal size, non-tender, without nodularity LYMPH:  no palpable lymphadenopathy in the cervical, axillary or inguinal LUNGS: clear to  auscultation and percussion with normal breathing effort HEART: regular rate & rhythm and no murmurs and no lower extremity edema ABDOMEN:abdomen soft, non-tender and normal bowel sounds Musculoskeletal:no cyanosis of digits and no clubbing  NEURO: alert & oriented x 3 with fluent speech, no focal motor/sensory deficits  LABORATORY DATA:  I have reviewed the data as listed    Component Value Date/Time   NA 143 06/30/2016 1233   K 4.1 06/30/2016 1233   CL 108 12/15/2014 1729   CO2 22 06/30/2016 1233   GLUCOSE 84 06/30/2016 1233   BUN 13.4 06/30/2016 1233   CREATININE 0.8 06/30/2016 1233   CALCIUM 9.1 06/30/2016 1233   PROT 5.9 (L) 06/30/2016 1233   PROT 6.2 (L) 06/30/2016 1233   ALBUMIN 3.4 (L) 06/30/2016 1233   AST 19 06/30/2016 1233   ALT 17 06/30/2016 1233   ALKPHOS 101 06/30/2016 1233   BILITOT 0.32 06/30/2016 1233   GFRNONAA >60 12/15/2014 1729   GFRAA >60 12/15/2014 1729    No results found  for: SPEP, UPEP  Lab Results  Component Value Date   WBC 3.3 (L) 06/30/2016   NEUTROABS 1.6 06/30/2016   HGB 9.3 (L) 06/30/2016   HCT 28.9 (L) 06/30/2016   MCV 89.5 06/30/2016   PLT 215 06/30/2016      Chemistry      Component Value Date/Time   NA 143 06/30/2016 1233   K 4.1 06/30/2016 1233   CL 108 12/15/2014 1729   CO2 22 06/30/2016 1233   BUN 13.4 06/30/2016 1233   CREATININE 0.8 06/30/2016 1233      Component Value Date/Time   CALCIUM 9.1 06/30/2016 1233   ALKPHOS 101 06/30/2016 1233   AST 19 06/30/2016 1233   ALT 17 06/30/2016 1233   BILITOT 0.32 06/30/2016 1233       ASSESSMENT & PLAN:  Multiple myeloma in remission ([place]) She is currently receiving Zometa and Revlimid She had mild reaction to Zometa previously with some bone pain and hives, but doing better with reduced dose at 3 mg. We will continue to same dose.  She will continue calcium and vitamin D supplements along with aspirin. Recent blood work suggested that she is in complete remission. The  patient is pancytopenic. She will be completing Revlimid in a few days. After she completes the treatment, I recommend she takes 2 weeks break to allow recovery of her blood count before she resume Revlimid.  S/P bone marrow transplant [facility]) The patient is doing well s/p bone marrow transplant. She will continue antimicrobials prophylaxis as directed by [facility]. The patient is returning to [facility] in February for follow-up. 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 She is not symptomatic except for fatigue. The patient does have recurrent mucositis. After she completes current cycle, I recommend she take 2-3 weeks but before resumption. She agreed with the plan of care  Mucositis due to antineoplastic therapy She has mild mucositis and felt it is causing some pain when she tries to eat. I recommend another trial of Magic mouthwash and treatment break   No orders of the defined types were placed in this encounter.  All questions were answered. The patient knows to call the clinic with any problems, questions or concerns. No barriers to learning was detected. I spent 20 minutes counseling the patient face to face. The total time spent in the appointment was 25 minutes and more than 50% was on counseling and review of test results     [name], MD 07/09/2016 1:42 PM

## 2016-07-12 ENCOUNTER — Telehealth: Payer: Self-pay | Admitting: *Deleted

## 2016-07-12 ENCOUNTER — Other Ambulatory Visit: Payer: Self-pay | Admitting: Hematology and Oncology

## 2016-07-12 ENCOUNTER — Ambulatory Visit (HOSPITAL_COMMUNITY)
Admission: RE | Admit: 2016-07-12 | Discharge: 2016-07-12 | Disposition: A | Discharge status: Home / Self Care | Payer: Medicare Other | Source: Ambulatory Visit | Attending: Hematology and Oncology | Admitting: Hematology and Oncology

## 2016-07-12 ENCOUNTER — Ambulatory Visit (HOSPITAL_BASED_OUTPATIENT_CLINIC_OR_DEPARTMENT_OTHER): Payer: Medicare Other

## 2016-07-12 DIAGNOSIS — R918 Other nonspecific abnormal finding of lung field: Secondary | ICD-10-CM | POA: Insufficient documentation

## 2016-07-12 DIAGNOSIS — R071 Chest pain on breathing: Secondary | ICD-10-CM | POA: Diagnosis present

## 2016-07-12 DIAGNOSIS — C9001 Multiple myeloma in remission: Secondary | ICD-10-CM

## 2016-07-12 LAB — CBC WITH DIFFERENTIAL/PLATELET
BASO%: 1.2 % (ref 0.0–2.0)
Basophils Absolute: 0.1 10*3/uL (ref 0.0–0.1)
EOS%: 1.4 % (ref 0.0–7.0)
Eosinophils Absolute: 0.1 10*3/uL (ref 0.0–0.5)
HCT: 32.6 % — ABNORMAL LOW (ref 34.8–46.6)
HGB: 10.5 g/dL — ABNORMAL LOW (ref 11.6–15.9)
LYMPH%: 14.6 % (ref 14.0–49.7)
MCH: 27.9 pg (ref 25.1–34.0)
MCHC: 32.1 g/dL (ref 31.5–36.0)
MCV: 86.8 fL (ref 79.5–101.0)
MONO#: 0.8 10*3/uL (ref 0.1–0.9)
MONO%: 10.3 % (ref 0.0–14.0)
NEUT#: 5.9 10*3/uL (ref 1.5–6.5)
NEUT%: 72.5 % (ref 38.4–76.8)
Platelets: 191 10*3/uL (ref 145–400)
RBC: 3.76 10*6/uL (ref 3.70–5.45)
RDW: 16.7 % — ABNORMAL HIGH (ref 11.2–14.5)
WBC: 8.1 10*3/uL (ref 3.9–10.3)
lymph#: 1.2 10*3/uL (ref 0.9–3.3)

## 2016-07-12 LAB — COMPREHENSIVE METABOLIC PANEL
ALT: 54 U/L (ref 0–55)
AST: 49 U/L — ABNORMAL HIGH (ref 5–34)
Albumin: 3.1 g/dL — ABNORMAL LOW (ref 3.5–5.0)
Alkaline Phosphatase: 242 U/L — ABNORMAL HIGH (ref 40–150)
Anion Gap: 15 mEq/L — ABNORMAL HIGH (ref 3–11)
BUN: 12.6 mg/dL (ref 7.0–26.0)
CO2: 15 mEq/L — ABNORMAL LOW (ref 22–29)
Calcium: 9.1 mg/dL (ref 8.4–10.4)
Chloride: 107 mEq/L (ref 98–109)
Creatinine: 1.2 mg/dL — ABNORMAL HIGH (ref 0.6–1.1)
EGFR: 49 mL/min/{1.73_m2} — ABNORMAL LOW (ref >90)
Glucose: 94 mg/dl (ref 70–140)
Potassium: 3.4 mEq/L — ABNORMAL LOW (ref 3.5–5.1)
Sodium: 137 mEq/L (ref 136–145)
Total Bilirubin: 0.76 mg/dL (ref 0.20–1.20)
Total Protein: 7.3 g/dL (ref 6.4–8.3)

## 2016-07-12 NOTE — Telephone Encounter (Signed)
Per LOS I have scheduled and notified the scheduler

## 2016-07-12 NOTE — Telephone Encounter (Signed)
Pt states she is having severe pain in left side, especially with each deep breath.   Will come in for labs and CXR today

## 2016-07-13 ENCOUNTER — Ambulatory Visit (HOSPITAL_COMMUNITY)
Admission: RE | Admit: 2016-07-13 | Discharge: 2016-07-13 | Disposition: A | Discharge status: Home / Self Care | Payer: Medicare Other | Source: Ambulatory Visit | Attending: Hematology and Oncology | Admitting: Hematology and Oncology

## 2016-07-13 ENCOUNTER — Encounter: Payer: Self-pay | Admitting: Hematology and Oncology

## 2016-07-13 ENCOUNTER — Ambulatory Visit (HOSPITAL_BASED_OUTPATIENT_CLINIC_OR_DEPARTMENT_OTHER): Payer: Medicare Other | Admitting: Nurse Practitioner

## 2016-07-13 ENCOUNTER — Ambulatory Visit (HOSPITAL_BASED_OUTPATIENT_CLINIC_OR_DEPARTMENT_OTHER): Payer: Medicare Other | Admitting: Hematology and Oncology

## 2016-07-13 ENCOUNTER — Telehealth: Payer: Self-pay | Admitting: Hematology and Oncology

## 2016-07-13 DIAGNOSIS — R918 Other nonspecific abnormal finding of lung field: Secondary | ICD-10-CM | POA: Insufficient documentation

## 2016-07-13 DIAGNOSIS — R071 Chest pain on breathing: Secondary | ICD-10-CM

## 2016-07-13 DIAGNOSIS — J189 Pneumonia, unspecified organism: Secondary | ICD-10-CM

## 2016-07-13 DIAGNOSIS — J181 Lobar pneumonia, unspecified organism: Secondary | ICD-10-CM

## 2016-07-13 DIAGNOSIS — R0789 Other chest pain: Secondary | ICD-10-CM | POA: Insufficient documentation

## 2016-07-13 DIAGNOSIS — C9001 Multiple myeloma in remission: Secondary | ICD-10-CM

## 2016-07-13 DIAGNOSIS — R112 Nausea with vomiting, unspecified: Secondary | ICD-10-CM

## 2016-07-13 DIAGNOSIS — E860 Dehydration: Secondary | ICD-10-CM

## 2016-07-13 DIAGNOSIS — M792 Neuralgia and neuritis, unspecified: Secondary | ICD-10-CM

## 2016-07-13 LAB — D-DIMER, QUANTITATIVE (NOT AT ARMC): D-DIMER: 0.74 mg/L FEU — ABNORMAL HIGH (ref 0.00–0.49)

## 2016-07-13 MED ORDER — ONDANSETRON HCL 4 MG/2ML IJ SOLN
8.0000 mg | Freq: Once | INTRAMUSCULAR | Status: AC
  Administered 2016-07-13: 8 mg via INTRAVENOUS

## 2016-07-13 MED ORDER — LORAZEPAM 1 MG PO TABS
0.5000 mg | ORAL_TABLET | Freq: Once | ORAL | Status: AC
  Administered 2016-07-13: 0.5 mg via ORAL

## 2016-07-13 MED ORDER — HYDROMORPHONE HCL 4 MG/ML IJ SOLN
2.0000 mg | Freq: Once | INTRAMUSCULAR | Status: AC
  Administered 2016-07-13: 2 mg via INTRAVENOUS

## 2016-07-13 MED ORDER — SODIUM CHLORIDE 0.9 % IJ SOLN
INTRAMUSCULAR | Status: AC
  Filled 2016-07-13: qty 50

## 2016-07-13 MED ORDER — IOPAMIDOL (ISOVUE-300) INJECTION 61%
INTRAVENOUS | Status: AC
  Filled 2016-07-13: qty 75

## 2016-07-13 MED ORDER — LEVOFLOXACIN 500 MG PO TABS: 500.0000 mg | ORAL_TABLET | Freq: Every day | ORAL | 0 refills | Status: DC

## 2016-07-13 MED ORDER — SODIUM CHLORIDE 0.9 % IV SOLN
Freq: Once | INTRAVENOUS | Status: AC
  Administered 2016-07-13: 13:00:00 via INTRAVENOUS

## 2016-07-13 MED ORDER — SODIUM CHLORIDE 0.9 % IV SOLN: Freq: Once | INTRAVENOUS | Status: DC

## 2016-07-13 MED ORDER — LORAZEPAM 1 MG PO TABS
ORAL_TABLET | ORAL | Status: AC
  Filled 2016-07-13: qty 1

## 2016-07-13 MED ORDER — LORAZEPAM 0.5 MG PO TABS: 0.5000 mg | ORAL_TABLET | Freq: Three times a day (TID) | ORAL | 0 refills | Status: DC | PRN

## 2016-07-13 MED ORDER — HYDROMORPHONE HCL 4 MG/ML IJ SOLN
INTRAMUSCULAR | Status: AC
  Filled 2016-07-13: qty 1

## 2016-07-13 MED ORDER — IOPAMIDOL (ISOVUE-300) INJECTION 61%
75.0000 mL | Freq: Once | INTRAVENOUS | Status: AC | PRN
  Administered 2016-07-13: 75 mL via INTRAVENOUS

## 2016-07-13 MED ORDER — HYDROMORPHONE HCL 4 MG PO TABS: 4.0000 mg | ORAL_TABLET | ORAL | 0 refills | Status: DC | PRN

## 2016-07-13 MED ORDER — ONDANSETRON HCL 4 MG/2ML IJ SOLN
INTRAMUSCULAR | Status: AC
  Filled 2016-07-13: qty 4

## 2016-07-13 NOTE — Patient Instructions (Signed)
Dehydration, Adult Dehydration is a condition in which there is not enough fluid or water in the body. This happens when you lose more fluids than you take in. Important organs, such as the kidneys, brain, and heart, cannot function without a proper amount of fluids. Any loss of fluids from the body can lead to dehydration. Dehydration can range from mild to severe. This condition should be treated right away to prevent it from becoming severe. What are the causes? This condition may be caused by:  Vomiting.  Diarrhea.  Excessive sweating, such as from heat exposure or exercise.  Not drinking enough fluid, especially:  When ill.  While doing activity that requires a lot of energy.  Excessive urination.  Fever.  Infection.  Certain medicines, such as medicines that cause the body to lose excess fluid (diuretics).  Inability to access safe drinking water.  Reduced physical ability to get adequate water and food. What increases the risk? This condition is more likely to develop in people:  Who have a poorly controlled long-term (chronic) illness, such as diabetes, heart disease, or kidney disease.  Who are age 65 or older.  Who are disabled.  Who live in a place with high altitude.  Who play endurance sports. What are the signs or symptoms? Symptoms of mild dehydration may include:   Thirst.  Dry lips.  Slightly dry mouth.  Dry, warm skin.  Dizziness. Symptoms of moderate dehydration may include:   Very dry mouth.  Muscle cramps.  Dark urine. Urine may be the color of tea.  Decreased urine production.  Decreased tear production.  Heartbeat that is irregular or faster than normal (palpitations).  Headache.  Light-headedness, especially when you stand up from a sitting position.  Fainting (syncope). Symptoms of severe dehydration may include:   Changes in skin, such as:  Cold and clammy skin.  Blotchy (mottled) or pale skin.  Skin that does  not quickly return to normal after being lightly pinched and released (poor skin turgor).  Changes in body fluids, such as:  Extreme thirst.  No tear production.  Inability to sweat when body temperature is high, such as in hot weather.  Very little urine production.  Changes in vital signs, such as:  Weak pulse.  Pulse that is more than 100 beats a minute when sitting still.  Rapid breathing.  Low blood pressure.  Other changes, such as:  Sunken eyes.  Cold hands and feet.  Confusion.  Lack of energy (lethargy).  Difficulty waking up from sleep.  Short-term weight loss.  Unconsciousness. How is this diagnosed? This condition is diagnosed based on your symptoms and a physical exam. Blood and urine tests may be done to help confirm the diagnosis. How is this treated? Treatment for this condition depends on the severity. Mild or moderate dehydration can often be treated at home. Treatment should be started right away. Do not wait until dehydration becomes severe. Severe dehydration is an emergency and it needs to be treated in a hospital. Treatment for mild dehydration may include:   Drinking more fluids.  Replacing salts and minerals in your blood (electrolytes) that you may have lost. Treatment for moderate dehydration may include:   Drinking an oral rehydration solution (ORS). This is a drink that helps you replace fluids and electrolytes (rehydrate). It can be found at pharmacies and retail stores. Treatment for severe dehydration may include:   Receiving fluids through an IV tube.  Receiving an electrolyte solution through a feeding tube that is   passed through your nose and into your stomach (nasogastric tube, or NG tube).  Correcting any abnormalities in electrolytes.  Treating the underlying cause of dehydration. Follow these instructions at home:  If directed by your health care provider, drink an ORS:  Make an ORS by following instructions on the  package.  Start by drinking small amounts, about  cup (120 mL) every 5-10 minutes.  Slowly increase how much you drink until you have taken the amount recommended by your health care provider.  Drink enough clear fluid to keep your urine clear or pale yellow. If you were told to drink an ORS, finish the ORS first, then start slowly drinking other clear fluids. Drink fluids such as:  Water. Do not drink only water. Doing that can lead to having too little salt (sodium) in the body (hyponatremia).  Ice chips.  Fruit juice that you have added water to (diluted fruit juice).  Low-calorie sports drinks.  Avoid:  Alcohol.  Drinks that contain a lot of sugar. These include high-calorie sports drinks, fruit juice that is not diluted, and soda.  Caffeine.  Foods that are greasy or contain a lot of fat or sugar.  Take over-the-counter and prescription medicines only as told by your health care provider.  Do not take sodium tablets. This can lead to having too much sodium in the body (hypernatremia).  Eat foods that contain a healthy balance of electrolytes, such as bananas, oranges, potatoes, tomatoes, and spinach.  Keep all follow-up visits as told by your health care provider. This is important. Contact a health care provider if:  You have abdominal pain that:  Gets worse.  Stays in one area (localizes).  You have a rash.  You have a stiff neck.  You are more irritable than usual.  You are sleepier or more difficult to wake up than usual.  You feel weak or dizzy.  You feel very thirsty.  You have urinated only a small amount of very dark urine over 6-8 hours. Get help right away if:  You have symptoms of severe dehydration.  You cannot drink fluids without vomiting.  Your symptoms get worse with treatment.  You have a fever.  You have a severe headache.  You have vomiting or diarrhea that:  Gets worse.  Does not go away.  You have blood or green matter  (bile) in your vomit.  You have blood in your stool. This may cause stool to look black and tarry.  You have not urinated in 6-8 hours.  You faint.  Your heart rate while sitting still is over 100 beats a minute.  You have trouble breathing. This information is not intended to replace advice given to you by your health care provider. Make sure you discuss any questions you have with your health care provider. Document Released: 07/26/2005 Document Revised: 02/20/2016 Document Reviewed: 09/19/2015 [employer] Interactive Patient Education  2017 [employer]  

## 2016-07-13 NOTE — Progress Notes (Signed)
[place] OFFICE PROGRESS NOTE  Patient Care Team: Josetta Huddle, MD as PCP - General (Internal Medicine) Ginette Pitman, MD as Consulting Physician (Hematology and Oncology) Hessie Dibble, MD as Consulting Physician (Hematology and Oncology)  SUMMARY OF ONCOLOGIC HISTORY: Oncology History    M-protein 0.69 gm/dl IFIX - IgG, Kappa IgG - 868 IgA - 19 IgM - < 20 Kappa - 21 Lambda - 5.7  09/06/2014 - Bone marrow aspirate and biopsy:   Normocellular marrow for age (40%) with a small monoclonal plasma cell population (1% on aspirate). Karyotype 37, XX  FISH Negative for myeloma associated changes  09/12/2014 - PET/CT  Two regions that are concerning for disease, one adjacent/involving the left ninth rib and one in the marrow of the right femur, in this patient with history of plasmacytoma.      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      09/06/2014 Bone Marrow Biopsy    BM performed at [facility] is not consistent with multiple myeloma, 1% plasma cell on aspirate      09/12/2014 Imaging     PET CT scan show involvement of left ninth rib and right femur      09/23/2014 - 10/23/2014 Radiation Therapy     she had radiation therapy to the cavernous sinus and skull base lesions, 45 Gy      10/21/2014 - 11/01/2014 Radiation Therapy     she had radiation to right femur , total 30 Gy      11/26/2014 - 02/14/2015 Chemotherapy     she is started on weekly dexamethasone, Velcade twice a week on day 1, 4, 8 and 11 and Revlimid days 1-14.      04/01/2015 Bone Marrow Transplant    She received  melphalan chemotherapy on 03/31/2015 followed by autologous stem cell transplant the day after      04/03/2015 - 04/18/2015 Hospital Admission    The patient was admitted to the hospital at [place] for management related to complication from stem cell transplant. She had significant nausea requiring intravenous anti-emetics.      07/17/2015 -  Chemotherapy    She started maintenance Revlimid and monthly zometa       INTERVAL HISTORY: Please see below for problem oriented charting. She is seen urgently today due to recent complain of weakness, cough, pleuritic chest pain and poor oral intake. Most of her symptoms started since her infusion treatment last week. She denies chills. With poor oral intake, she felt dizzy, nauseated and dehydrated. I saw her before CT scan and after CT scan. She denies recent hemoptysis. She received IV fluid, IV pain medicine and IV anti-emetics today with improvement of her symptoms  REVIEW OF SYSTEMS:   Constitutional: Denies fevers, chills or abnormal weight loss Eyes: Denies blurriness of vision Ears, nose, mouth, throat, and face: Denies mucositis or sore throat Cardiovascular: Denies palpitation, chest discomfort or lower extremity swelling Skin: Denies abnormal skin rashes Lymphatics: Denies new lymphadenopathy or easy bruising Behavioral/Psych: Mood is stable, no new changes  All other systems were reviewed with the patient and are  negative.  I have reviewed the past medical history, past surgical history, social history and family history with the patient and they are unchanged from previous note.  ALLERGIES:  is allergic to albuterol; codeine; ibuprofen; nsaids; and augmentin [amoxicillin-pot clavulanate].  MEDICATIONS:  Current Outpatient Prescriptions  Medication Sig Dispense Refill  . acyclovir (ZOVIRAX) 800 MG tablet Take 1 tablet (800 mg total) by mouth 2 (two) times daily. 60 tablet 1  . acyclovir ointment (ZOVIRAX) 5 % Apply 1  application topically every 3 (three) hours. 15 g 1  . aspirin EC 81 MG tablet Take 81 mg by mouth daily with breakfast.    . calcium carbonate (OSCAL) 1500 (600 Ca) MG TABS tablet Take 1,500 mg by mouth daily with breakfast.    . Cholecalciferol (VITAMIN D3) 2000 units capsule Take 1 capsule by mouth daily.    . fluticasone (FLONASE) 50 MCG/ACT nasal spray Place 1 spray into both nostrils 2 (two) times daily. 16 g 2  . HYDROcodone-acetaminophen (HYCET) 7.5-325 mg/15 ml solution Take 10 mLs by mouth 4 (four) times daily as needed for moderate pain. 120 mL 0  . HYDROmorphone (DILAUDID) 4 MG tablet Take 1 tablet (4 mg total) by mouth every 4 (four) hours as needed for severe pain. 60 tablet 0  . lenalidomide (REVLIMID) 5 MG capsule Take 1 capsule (5 mg total) by mouth daily. X 21 days on and 7 days off. 21 capsule 0  . levofloxacin (LEVAQUIN) 500 MG tablet Take 1 tablet (500 mg total) by mouth daily. 10 tablet 0  . LORazepam (ATIVAN) 0.5 MG tablet Take 1 tablet (0.5 mg total) by mouth every 8 (eight) hours as needed for anxiety (nausea). 60 tablet 0  . magic mouthwash SOLN Take 10 mLs by mouth 4 (four) times daily as needed for mouth pain. 480 mL 1  . Multiple Vitamins-Minerals (CENTRUM SILVER PO) Take by mouth.    Marland Kitchen omeprazole (PRILOSEC) 20 MG capsule take 1 capsule by mouth once daily 30 capsule 6  . ondansetron (ZOFRAN) 8 MG tablet Take 1 tablet (8 mg total) by mouth every 8 (eight) hours as needed for nausea or vomiting. 60 tablet 3  . senna-docusate (SENOKOT-S) 8.6-50 MG tablet Take 2 tablets by mouth once as needed.     No current facility-administered medications for this visit.    Facility-Administered Medications Ordered in Other Visits  Medication Dose Route Frequency Provider Last Rate Last Dose  . iopamidol (ISOVUE-300) 61 % injection           . sodium chloride 0.9 % injection             PHYSICAL EXAMINATION: ECOG PERFORMANCE STATUS: 2 - Symptomatic, <50% confined to  bed GENERAL:alert, no distress and comfortable. She looks weak and frail SKIN: skin color, texture, turgor are normal, no rashes or significant lesions EYES: normal, Conjunctiva are pink and non-injected, sclera clear OROPHARYNX:no exudate, no erythema and lips, buccal mucosa, and tongue normal  NECK: supple, thyroid normal size, non-tender, without nodularity LYMPH:  no palpable lymphadenopathy in the cervical, axillary or inguinal LUNGS: Reduced breath sound on the left chest without increased work of breathing. No crackles  HEART: regular rate & rhythm and no murmurs and no lower extremity edema ABDOMEN:abdomen soft, non-tender and normal bowel sounds Musculoskeletal:no cyanosis of digits and no clubbing  NEURO: alert & oriented x 3 with fluent speech, no focal motor/sensory deficits  LABORATORY DATA:  I have reviewed the data as listed    Component Value  Date/Time   NA 137 07/12/2016 1039   K 3.4 (L) 07/12/2016 1039   CL 108 12/15/2014 1729   CO2 15 (L) 07/12/2016 1039   GLUCOSE 94 07/12/2016 1039   BUN 12.6 07/12/2016 1039   CREATININE 1.2 (H) 07/12/2016 1039   CALCIUM 9.1 07/12/2016 1039   PROT 7.3 07/12/2016 1039   ALBUMIN 3.1 (L) 07/12/2016 1039   AST 49 (H) 07/12/2016 1039   ALT 54 07/12/2016 1039   ALKPHOS 242 (H) 07/12/2016 1039   BILITOT 0.76 07/12/2016 1039   GFRNONAA >60 12/15/2014 1729   GFRAA >60 12/15/2014 1729    No results found for: SPEP, UPEP  Lab Results  Component Value Date   WBC 8.1 07/12/2016   NEUTROABS 5.9 07/12/2016   HGB 10.5 (L) 07/12/2016   HCT 32.6 (L) 07/12/2016   MCV 86.8 07/12/2016   PLT 191 07/12/2016      Chemistry      Component Value Date/Time   NA 137 07/12/2016 1039   K 3.4 (L) 07/12/2016 1039   CL 108 12/15/2014 1729   CO2 15 (L) 07/12/2016 1039   BUN 12.6 07/12/2016 1039   CREATININE 1.2 (H) 07/12/2016 1039      Component Value Date/Time   CALCIUM 9.1 07/12/2016 1039   ALKPHOS 242 (H) 07/12/2016 1039   AST 49 (H)  07/12/2016 1039   ALT 54 07/12/2016 1039   BILITOT 0.76 07/12/2016 1039       RADIOGRAPHIC STUDIES: I have personally reviewed the radiological images as listed and agreed with the findings in the report. Dg Chest 2 View  Result Date: 07/12/2016 CLINICAL DATA:  Chest pain history of myeloma. EXAM: CHEST  2 VIEW COMPARISON:  No recent prior. FINDINGS: Mediastinum hilar structures are normal. Heart size normal. Ill-defined infiltrates noted in the superior segment of the left lower lobe in the left lung base. These changes most likely secondary to pneumonia. Given the peripheral nature of these densities pulmonary embolus cannot be excluded. Small left pleural effusion cannot be excluded. Questionable mild deformity noted of the left posterior eighth and ninth ribs. These changes may be related to myeloma given the patient's prior history. Other etiologies of bony abnormality including metastatic disease cannot be completely excluded. IMPRESSION: 1. Infiltrate in the superior segment left lower lobe and over the left lung base, most consistent with pneumonia. Given the peripheral nature of these infiltrates pulmonary embolus cannot be completely excluded. Close follow-up chest x-rays recommended to demonstrate clearing. Contrast-enhanced chest CT can be obtained for further evaluation as needed. 2. Questionable deformity noted of the left posterior eighth and ninth ribs. Given patient's history of myeloma, myelomatous involvement of the ribs cannot be excluded. Electronically Signed   By: Marcello Moores  Register   On: 07/12/2016 13:08   Ct Chest W Contrast  Result Date: 07/13/2016 CLINICAL DATA:  Pleuritic chest pain and nausea and vomiting. Abnormal left lung opacity on chest radiograph. Multiple myeloma in remission. EXAM: CT CHEST WITH CONTRAST TECHNIQUE: Multidetector CT imaging of the chest was performed during intravenous contrast administration. CONTRAST:  23m ISOVUE-300 IOPAMIDOL (ISOVUE-300)  INJECTION 61% COMPARISON:  Chest radiograph on 07/12/2016 FINDINGS: Cardiovascular:  No acute findings. Aortic atherosclerosis. Mediastinum/Nodes: No masses or pathologically enlarged lymph nodes identified. Lungs/Pleura: Airspace consolidation is seen predominately involving the left lower lobe with minimal involvement of the inferior posterior lingula. Central air bronchograms are demonstrated. This is most consistent with pneumonia. Right lung is clear. No pulmonary masses are identified. No evidence of pleural effusion. Upper  Abdomen: Several tiny sub-cm hepatic cysts are noted. Normal adrenal glands. Musculoskeletal:  No suspicious bone lesions. IMPRESSION: Left lung airspace consolidation with predominant involvement of the left lower lobe, consistent with pneumonia. No evidence of centrally obstructing mass, lymphadenopathy, or pleural effusion. Electronically Signed   By: Earle Gell M.D.   On: 07/13/2016 15:36     ASSESSMENT & PLAN:  Multiple myeloma in remission [facility]) Per discussion last week, will continue to hold Revlimid  Pneumonia She had abnormal chest x-ray. She had atypical pleuritic chest pain. CT angiogram excluded PE but show evidence of pneumonia. This is likely related to side effects from recent treatment causing increased risk of infection. I recommend antibiotic therapy with Levaquin for 10 days. I will see her back next week to assess response to treatment  Atypical chest pain She has atypical chest pain, responded well to Dilaudid. I gave her prescription of Dilaudid to take. I warned her risk of constipation and sedation  Nausea and vomiting She had profound nausea and vomiting recently. She responded well to IV fluid hydration and IV antiemetics. I will schedule more treatment for the next 3 days   No orders of the defined types were placed in this encounter.  All questions were answered. The patient knows to call the clinic with any problems, questions or  concerns. No barriers to learning was detected. I spent 40 minutes counseling the patient face to face. The total time spent in the appointment was 60 minutes and more than 50% was on counseling and review of test results     [name], MD 07/13/2016 4:31 PM

## 2016-07-13 NOTE — Assessment & Plan Note (Signed)
She had abnormal chest x-ray. She had atypical pleuritic chest pain. CT angiogram excluded PE but show evidence of pneumonia. This is likely related to side effects from recent treatment causing increased risk of infection. I recommend antibiotic therapy with Levaquin for 10 days. I will see her back next week to assess response to treatment

## 2016-07-13 NOTE — Telephone Encounter (Signed)
IVF added per 07/13/16 los. Appointments scheduled per 07/13/16 los. A copy of the AVS report and appointment schedule was given to the patient, per 07/13/16 los.

## 2016-07-13 NOTE — Assessment & Plan Note (Signed)
Per discussion last week, will continue to hold Revlimid

## 2016-07-13 NOTE — Assessment & Plan Note (Signed)
She has atypical chest pain, responded well to Dilaudid. I gave her prescription of Dilaudid to take. I warned her risk of constipation and sedation

## 2016-07-13 NOTE — Progress Notes (Signed)
Pt transported via w/c to Radiology for Chest CT Scan per [facility], LPN with husband in attendance. Periphral IV capped and saline locked  Nurse visit only.  No provider saw this patient.

## 2016-07-13 NOTE — Assessment & Plan Note (Signed)
She had profound nausea and vomiting recently. She responded well to IV fluid hydration and IV antiemetics. I will schedule more treatment for the next 3 days

## 2016-07-14 ENCOUNTER — Encounter: Payer: Self-pay | Admitting: Nurse Practitioner

## 2016-07-14 ENCOUNTER — Other Ambulatory Visit: Payer: Self-pay | Admitting: Hematology and Oncology

## 2016-07-14 ENCOUNTER — Other Ambulatory Visit: Payer: Self-pay | Admitting: *Deleted

## 2016-07-14 ENCOUNTER — Ambulatory Visit (HOSPITAL_BASED_OUTPATIENT_CLINIC_OR_DEPARTMENT_OTHER): Payer: Medicare Other | Admitting: Nurse Practitioner

## 2016-07-14 VITALS — BP 113/72 | HR 93 | Temp 98.5°F | Resp 18 | Ht 60.25 in | Wt 114.1 lb

## 2016-07-14 DIAGNOSIS — C9001 Multiple myeloma in remission: Secondary | ICD-10-CM

## 2016-07-14 DIAGNOSIS — R112 Nausea with vomiting, unspecified: Secondary | ICD-10-CM | POA: Diagnosis not present

## 2016-07-14 MED ORDER — SODIUM CHLORIDE 0.9 % IV SOLN
INTRAVENOUS | Status: DC
  Administered 2016-07-14: 10:00:00 via INTRAVENOUS

## 2016-07-14 MED ORDER — DEXAMETHASONE 4 MG PO TABS: 4.0000 mg | ORAL_TABLET | Freq: Every day | ORAL | 0 refills | Status: DC

## 2016-07-14 MED ORDER — HYDROMORPHONE HCL 4 MG/ML IJ SOLN
INTRAMUSCULAR | Status: AC
  Filled 2016-07-14: qty 1

## 2016-07-14 MED ORDER — HYDROMORPHONE HCL 4 MG/ML IJ SOLN
2.0000 mg | INTRAMUSCULAR | Status: DC | PRN
  Administered 2016-07-14: 2 mg via INTRAVENOUS

## 2016-07-14 MED ORDER — LORAZEPAM 2 MG/ML IJ SOLN
INTRAMUSCULAR | Status: AC
  Filled 2016-07-14: qty 1

## 2016-07-14 MED ORDER — LORAZEPAM 2 MG/ML IJ SOLN
0.5000 mg | Freq: Once | INTRAMUSCULAR | Status: AC
  Administered 2016-07-14: 0.5 mg via INTRAVENOUS

## 2016-07-14 MED FILL — DEXAMETHASONE 4 MG TABLET: 4 | 30 days supply | Qty: 30 | Fill #0

## 2016-07-14 NOTE — Progress Notes (Signed)
Still c/o nausea after Ativan Iv and fluids. Vomited x2. Dr. Alvy Bimler aware.  She will call in dexamethasone for pt @ [place]. Pt and husband instructed on this medication.  Pt returning tomorrow for IVFs again and will check on her nausea status.  Pt and husband voiced understanding. _________________________  This was a nurse visit only.

## 2016-07-14 NOTE — Patient Instructions (Signed)
Dehydration, Adult Dehydration is a condition in which there is not enough fluid or water in the body. This happens when you lose more fluids than you take in. Important organs, such as the kidneys, brain, and heart, cannot function without a proper amount of fluids. Any loss of fluids from the body can lead to dehydration. Dehydration can range from mild to severe. This condition should be treated right away to prevent it from becoming severe. What are the causes? This condition may be caused by:  Vomiting.  Diarrhea.  Excessive sweating, such as from heat exposure or exercise.  Not drinking enough fluid, especially:  When ill.  While doing activity that requires a lot of energy.  Excessive urination.  Fever.  Infection.  Certain medicines, such as medicines that cause the body to lose excess fluid (diuretics).  Inability to access safe drinking water.  Reduced physical ability to get adequate water and food. What increases the risk? This condition is more likely to develop in people:  Who have a poorly controlled long-term (chronic) illness, such as diabetes, heart disease, or kidney disease.  Who are age 65 or older.  Who are disabled.  Who live in a place with high altitude.  Who play endurance sports. What are the signs or symptoms? Symptoms of mild dehydration may include:   Thirst.  Dry lips.  Slightly dry mouth.  Dry, warm skin.  Dizziness. Symptoms of moderate dehydration may include:   Very dry mouth.  Muscle cramps.  Dark urine. Urine may be the color of tea.  Decreased urine production.  Decreased tear production.  Heartbeat that is irregular or faster than normal (palpitations).  Headache.  Light-headedness, especially when you stand up from a sitting position.  Fainting (syncope). Symptoms of severe dehydration may include:   Changes in skin, such as:  Cold and clammy skin.  Blotchy (mottled) or pale skin.  Skin that does  not quickly return to normal after being lightly pinched and released (poor skin turgor).  Changes in body fluids, such as:  Extreme thirst.  No tear production.  Inability to sweat when body temperature is high, such as in hot weather.  Very little urine production.  Changes in vital signs, such as:  Weak pulse.  Pulse that is more than 100 beats a minute when sitting still.  Rapid breathing.  Low blood pressure.  Other changes, such as:  Sunken eyes.  Cold hands and feet.  Confusion.  Lack of energy (lethargy).  Difficulty waking up from sleep.  Short-term weight loss.  Unconsciousness. How is this diagnosed? This condition is diagnosed based on your symptoms and a physical exam. Blood and urine tests may be done to help confirm the diagnosis. How is this treated? Treatment for this condition depends on the severity. Mild or moderate dehydration can often be treated at home. Treatment should be started right away. Do not wait until dehydration becomes severe. Severe dehydration is an emergency and it needs to be treated in a hospital. Treatment for mild dehydration may include:   Drinking more fluids.  Replacing salts and minerals in your blood (electrolytes) that you may have lost. Treatment for moderate dehydration may include:   Drinking an oral rehydration solution (ORS). This is a drink that helps you replace fluids and electrolytes (rehydrate). It can be found at pharmacies and retail stores. Treatment for severe dehydration may include:   Receiving fluids through an IV tube.  Receiving an electrolyte solution through a feeding tube that is   passed through your nose and into your stomach (nasogastric tube, or NG tube).  Correcting any abnormalities in electrolytes.  Treating the underlying cause of dehydration. Follow these instructions at home:  If directed by your health care provider, drink an ORS:  Make an ORS by following instructions on the  package.  Start by drinking small amounts, about  cup (120 mL) every 5-10 minutes.  Slowly increase how much you drink until you have taken the amount recommended by your health care provider.  Drink enough clear fluid to keep your urine clear or pale yellow. If you were told to drink an ORS, finish the ORS first, then start slowly drinking other clear fluids. Drink fluids such as:  Water. Do not drink only water. Doing that can lead to having too little salt (sodium) in the body (hyponatremia).  Ice chips.  Fruit juice that you have added water to (diluted fruit juice).  Low-calorie sports drinks.  Avoid:  Alcohol.  Drinks that contain a lot of sugar. These include high-calorie sports drinks, fruit juice that is not diluted, and soda.  Caffeine.  Foods that are greasy or contain a lot of fat or sugar.  Take over-the-counter and prescription medicines only as told by your health care provider.  Do not take sodium tablets. This can lead to having too much sodium in the body (hypernatremia).  Eat foods that contain a healthy balance of electrolytes, such as bananas, oranges, potatoes, tomatoes, and spinach.  Keep all follow-up visits as told by your health care provider. This is important. Contact a health care provider if:  You have abdominal pain that:  Gets worse.  Stays in one area (localizes).  You have a rash.  You have a stiff neck.  You are more irritable than usual.  You are sleepier or more difficult to wake up than usual.  You feel weak or dizzy.  You feel very thirsty.  You have urinated only a small amount of very dark urine over 6-8 hours. Get help right away if:  You have symptoms of severe dehydration.  You cannot drink fluids without vomiting.  Your symptoms get worse with treatment.  You have a fever.  You have a severe headache.  You have vomiting or diarrhea that:  Gets worse.  Does not go away.  You have blood or green matter  (bile) in your vomit.  You have blood in your stool. This may cause stool to look black and tarry.  You have not urinated in 6-8 hours.  You faint.  Your heart rate while sitting still is over 100 beats a minute.  You have trouble breathing. This information is not intended to replace advice given to you by your health care provider. Make sure you discuss any questions you have with your health care provider. Document Released: 07/26/2005 Document Revised: 02/20/2016 Document Reviewed: 09/19/2015 [employer] Interactive Patient Education  2017 [employer]  

## 2016-07-15 ENCOUNTER — Ambulatory Visit (HOSPITAL_BASED_OUTPATIENT_CLINIC_OR_DEPARTMENT_OTHER): Payer: Medicare Other | Admitting: Nurse Practitioner

## 2016-07-15 ENCOUNTER — Encounter: Payer: Self-pay | Admitting: Nurse Practitioner

## 2016-07-15 VITALS — BP 107/68 | Temp 97.8°F | Resp 16 | Ht 60.25 in | Wt 115.3 lb

## 2016-07-15 DIAGNOSIS — C9001 Multiple myeloma in remission: Secondary | ICD-10-CM

## 2016-07-15 DIAGNOSIS — Z9481 Bone marrow transplant status: Secondary | ICD-10-CM

## 2016-07-15 DIAGNOSIS — R110 Nausea: Secondary | ICD-10-CM

## 2016-07-15 MED ORDER — SODIUM CHLORIDE 0.9 % IV SOLN
INTRAVENOUS | Status: DC
  Administered 2016-07-15: 10:00:00 via INTRAVENOUS

## 2016-07-15 NOTE — Patient Instructions (Signed)
Dehydration, Adult Dehydration is a condition in which there is not enough fluid or water in the body. This happens when you lose more fluids than you take in. Important organs, such as the kidneys, brain, and heart, cannot function without a proper amount of fluids. Any loss of fluids from the body can lead to dehydration. Dehydration can range from mild to severe. This condition should be treated right away to prevent it from becoming severe. What are the causes? This condition may be caused by:  Vomiting.  Diarrhea.  Excessive sweating, such as from heat exposure or exercise.  Not drinking enough fluid, especially:  When ill.  While doing activity that requires a lot of energy.  Excessive urination.  Fever.  Infection.  Certain medicines, such as medicines that cause the body to lose excess fluid (diuretics).  Inability to access safe drinking water.  Reduced physical ability to get adequate water and food. What increases the risk? This condition is more likely to develop in people:  Who have a poorly controlled long-term (chronic) illness, such as diabetes, heart disease, or kidney disease.  Who are age 65 or older.  Who are disabled.  Who live in a place with high altitude.  Who play endurance sports. What are the signs or symptoms? Symptoms of mild dehydration may include:   Thirst.  Dry lips.  Slightly dry mouth.  Dry, warm skin.  Dizziness. Symptoms of moderate dehydration may include:   Very dry mouth.  Muscle cramps.  Dark urine. Urine may be the color of tea.  Decreased urine production.  Decreased tear production.  Heartbeat that is irregular or faster than normal (palpitations).  Headache.  Light-headedness, especially when you stand up from a sitting position.  Fainting (syncope). Symptoms of severe dehydration may include:   Changes in skin, such as:  Cold and clammy skin.  Blotchy (mottled) or pale skin.  Skin that does  not quickly return to normal after being lightly pinched and released (poor skin turgor).  Changes in body fluids, such as:  Extreme thirst.  No tear production.  Inability to sweat when body temperature is high, such as in hot weather.  Very little urine production.  Changes in vital signs, such as:  Weak pulse.  Pulse that is more than 100 beats a minute when sitting still.  Rapid breathing.  Low blood pressure.  Other changes, such as:  Sunken eyes.  Cold hands and feet.  Confusion.  Lack of energy (lethargy).  Difficulty waking up from sleep.  Short-term weight loss.  Unconsciousness. How is this diagnosed? This condition is diagnosed based on your symptoms and a physical exam. Blood and urine tests may be done to help confirm the diagnosis. How is this treated? Treatment for this condition depends on the severity. Mild or moderate dehydration can often be treated at home. Treatment should be started right away. Do not wait until dehydration becomes severe. Severe dehydration is an emergency and it needs to be treated in a hospital. Treatment for mild dehydration may include:   Drinking more fluids.  Replacing salts and minerals in your blood (electrolytes) that you may have lost. Treatment for moderate dehydration may include:   Drinking an oral rehydration solution (ORS). This is a drink that helps you replace fluids and electrolytes (rehydrate). It can be found at pharmacies and retail stores. Treatment for severe dehydration may include:   Receiving fluids through an IV tube.  Receiving an electrolyte solution through a feeding tube that is   passed through your nose and into your stomach (nasogastric tube, or NG tube).  Correcting any abnormalities in electrolytes.  Treating the underlying cause of dehydration. Follow these instructions at home:  If directed by your health care provider, drink an ORS:  Make an ORS by following instructions on the  package.  Start by drinking small amounts, about  cup (120 mL) every 5-10 minutes.  Slowly increase how much you drink until you have taken the amount recommended by your health care provider.  Drink enough clear fluid to keep your urine clear or pale yellow. If you were told to drink an ORS, finish the ORS first, then start slowly drinking other clear fluids. Drink fluids such as:  Water. Do not drink only water. Doing that can lead to having too little salt (sodium) in the body (hyponatremia).  Ice chips.  Fruit juice that you have added water to (diluted fruit juice).  Low-calorie sports drinks.  Avoid:  Alcohol.  Drinks that contain a lot of sugar. These include high-calorie sports drinks, fruit juice that is not diluted, and soda.  Caffeine.  Foods that are greasy or contain a lot of fat or sugar.  Take over-the-counter and prescription medicines only as told by your health care provider.  Do not take sodium tablets. This can lead to having too much sodium in the body (hypernatremia).  Eat foods that contain a healthy balance of electrolytes, such as bananas, oranges, potatoes, tomatoes, and spinach.  Keep all follow-up visits as told by your health care provider. This is important. Contact a health care provider if:  You have abdominal pain that:  Gets worse.  Stays in one area (localizes).  You have a rash.  You have a stiff neck.  You are more irritable than usual.  You are sleepier or more difficult to wake up than usual.  You feel weak or dizzy.  You feel very thirsty.  You have urinated only a small amount of very dark urine over 6-8 hours. Get help right away if:  You have symptoms of severe dehydration.  You cannot drink fluids without vomiting.  Your symptoms get worse with treatment.  You have a fever.  You have a severe headache.  You have vomiting or diarrhea that:  Gets worse.  Does not go away.  You have blood or green matter  (bile) in your vomit.  You have blood in your stool. This may cause stool to look black and tarry.  You have not urinated in 6-8 hours.  You faint.  Your heart rate while sitting still is over 100 beats a minute.  You have trouble breathing. This information is not intended to replace advice given to you by your health care provider. Make sure you discuss any questions you have with your health care provider. Document Released: 07/26/2005 Document Revised: 02/20/2016 Document Reviewed: 09/19/2015 [employer] Interactive Patient Education  2017 [employer]  

## 2016-07-15 NOTE — Progress Notes (Signed)
Pt feeling much better today. Denies pain. States nausea has diminished some. She is taking the zofran every 8 hours OTC and the daily decadron.  Her oral fluid and food intake is improving. Has gained 1 lb this week.  No pain meds or antiemetics given today-just IVFluids. ______________________  This was a nurse visit only. No provider saw pt.

## 2016-07-16 ENCOUNTER — Inpatient Hospital Stay (HOSPITAL_COMMUNITY)
Admission: AD | Admit: 2016-07-16 | Discharge: 2016-07-19 | DRG: 194 | Disposition: A | Discharge status: Home / Self Care | Payer: Medicare Other | Source: Ambulatory Visit | Attending: Hematology and Oncology | Admitting: Hematology and Oncology

## 2016-07-16 ENCOUNTER — Encounter (HOSPITAL_COMMUNITY): Payer: Self-pay

## 2016-07-16 ENCOUNTER — Ambulatory Visit (HOSPITAL_BASED_OUTPATIENT_CLINIC_OR_DEPARTMENT_OTHER): Payer: Medicare Other

## 2016-07-16 ENCOUNTER — Other Ambulatory Visit: Payer: Self-pay | Admitting: Hematology and Oncology

## 2016-07-16 ENCOUNTER — Ambulatory Visit (HOSPITAL_BASED_OUTPATIENT_CLINIC_OR_DEPARTMENT_OTHER): Payer: Medicare Other | Admitting: Nurse Practitioner

## 2016-07-16 ENCOUNTER — Encounter: Payer: Self-pay | Admitting: Nurse Practitioner

## 2016-07-16 VITALS — BP 116/63 | HR 72 | Temp 97.7°F | Resp 17 | Ht 60.25 in | Wt 116.3 lb

## 2016-07-16 DIAGNOSIS — D61818 Other pancytopenia: Secondary | ICD-10-CM | POA: Diagnosis present

## 2016-07-16 DIAGNOSIS — Z885 Allergy status to narcotic agent status: Secondary | ICD-10-CM | POA: Diagnosis not present

## 2016-07-16 DIAGNOSIS — E876 Hypokalemia: Secondary | ICD-10-CM | POA: Diagnosis present

## 2016-07-16 DIAGNOSIS — R531 Weakness: Secondary | ICD-10-CM | POA: Diagnosis not present

## 2016-07-16 DIAGNOSIS — D638 Anemia in other chronic diseases classified elsewhere: Secondary | ICD-10-CM | POA: Diagnosis present

## 2016-07-16 DIAGNOSIS — R0789 Other chest pain: Secondary | ICD-10-CM | POA: Diagnosis not present

## 2016-07-16 DIAGNOSIS — E440 Moderate protein-calorie malnutrition: Secondary | ICD-10-CM

## 2016-07-16 DIAGNOSIS — C9001 Multiple myeloma in remission: Secondary | ICD-10-CM | POA: Diagnosis present

## 2016-07-16 DIAGNOSIS — Z888 Allergy status to other drugs, medicaments and biological substances status: Secondary | ICD-10-CM | POA: Diagnosis not present

## 2016-07-16 DIAGNOSIS — Z9842 Cataract extraction status, left eye: Secondary | ICD-10-CM

## 2016-07-16 DIAGNOSIS — Z9484 Stem cells transplant status: Secondary | ICD-10-CM | POA: Diagnosis not present

## 2016-07-16 DIAGNOSIS — R112 Nausea with vomiting, unspecified: Secondary | ICD-10-CM | POA: Diagnosis present

## 2016-07-16 DIAGNOSIS — Z9481 Bone marrow transplant status: Secondary | ICD-10-CM

## 2016-07-16 DIAGNOSIS — Z961 Presence of intraocular lens: Secondary | ICD-10-CM | POA: Diagnosis present

## 2016-07-16 DIAGNOSIS — Z9841 Cataract extraction status, right eye: Secondary | ICD-10-CM

## 2016-07-16 DIAGNOSIS — Z880 Allergy status to penicillin: Secondary | ICD-10-CM

## 2016-07-16 DIAGNOSIS — J189 Pneumonia, unspecified organism: Principal | ICD-10-CM

## 2016-07-16 DIAGNOSIS — C9000 Multiple myeloma not having achieved remission: Secondary | ICD-10-CM | POA: Diagnosis present

## 2016-07-16 DIAGNOSIS — C9002 Multiple myeloma in relapse: Secondary | ICD-10-CM | POA: Diagnosis present

## 2016-07-16 LAB — CBC WITH DIFFERENTIAL/PLATELET
BASO%: 0 % (ref 0.0–2.0)
Basophils Absolute: 0 10*3/uL (ref 0.0–0.1)
EOS%: 1.2 % (ref 0.0–7.0)
Eosinophils Absolute: 0.1 10*3/uL (ref 0.0–0.5)
HCT: 27.2 % — ABNORMAL LOW (ref 34.8–46.6)
HGB: 9.1 g/dL — ABNORMAL LOW (ref 11.6–15.9)
LYMPH%: 12.7 % — ABNORMAL LOW (ref 14.0–49.7)
MCH: 27.7 pg (ref 25.1–34.0)
MCHC: 33.5 g/dL (ref 31.5–36.0)
MCV: 82.9 fL (ref 79.5–101.0)
MONO#: 1.1 10*3/uL — ABNORMAL HIGH (ref 0.1–0.9)
MONO%: 16.8 % — ABNORMAL HIGH (ref 0.0–14.0)
NEUT#: 4.6 10*3/uL (ref 1.5–6.5)
NEUT%: 69.3 % (ref 38.4–76.8)
Platelets: 206 10*3/uL (ref 145–400)
RBC: 3.28 10*6/uL — ABNORMAL LOW (ref 3.70–5.45)
RDW: 16.2 % — ABNORMAL HIGH (ref 11.2–14.5)
WBC: 6.6 10*3/uL (ref 3.9–10.3)
lymph#: 0.8 10*3/uL — ABNORMAL LOW (ref 0.9–3.3)

## 2016-07-16 LAB — C DIFFICILE QUICK SCREEN W PCR REFLEX
C Diff antigen: NEGATIVE
C Diff interpretation: NOT DETECTED
C Diff toxin: NEGATIVE

## 2016-07-16 LAB — COMPREHENSIVE METABOLIC PANEL
ALT: 39 U/L (ref 0–55)
AST: 33 U/L (ref 5–34)
Albumin: 2.6 g/dL — ABNORMAL LOW (ref 3.5–5.0)
Alkaline Phosphatase: 176 U/L — ABNORMAL HIGH (ref 40–150)
Anion Gap: 11 mEq/L (ref 3–11)
BUN: 15.6 mg/dL (ref 7.0–26.0)
CO2: 20 mEq/L — ABNORMAL LOW (ref 22–29)
Calcium: 8.3 mg/dL — ABNORMAL LOW (ref 8.4–10.4)
Chloride: 110 mEq/L — ABNORMAL HIGH (ref 98–109)
Creatinine: 0.9 mg/dL (ref 0.6–1.1)
EGFR: 70 mL/min/{1.73_m2} — ABNORMAL LOW (ref >90)
Glucose: 86 mg/dl (ref 70–140)
Potassium: 2.1 mEq/L — CL (ref 3.5–5.1)
Sodium: 142 mEq/L (ref 136–145)
Total Bilirubin: 0.34 mg/dL (ref 0.20–1.20)
Total Protein: 6.2 g/dL — ABNORMAL LOW (ref 6.4–8.3)

## 2016-07-16 LAB — MAGNESIUM: Magnesium: 2.2 mg/dl (ref 1.5–2.5)

## 2016-07-16 MED ORDER — SODIUM CHLORIDE 0.9 % IV SOLN
8.0000 mg | Freq: Three times a day (TID) | INTRAVENOUS | Status: DC | PRN
  Filled 2016-07-16: qty 4

## 2016-07-16 MED ORDER — SODIUM CHLORIDE 0.9 % IV SOLN: Freq: Once | INTRAVENOUS | Status: DC

## 2016-07-16 MED ORDER — SODIUM CHLORIDE 0.9 % IV SOLN
INTRAVENOUS | Status: DC
  Administered 2016-07-16 – 2016-07-18 (×3): via INTRAVENOUS

## 2016-07-16 MED ORDER — LORAZEPAM 0.5 MG PO TABS
0.5000 mg | ORAL_TABLET | Freq: Three times a day (TID) | ORAL | Status: DC | PRN
  Administered 2016-07-17: 0.5 mg via ORAL
  Filled 2016-07-16: qty 1

## 2016-07-16 MED ORDER — ONDANSETRON HCL 4 MG/2ML IJ SOLN
4.0000 mg | Freq: Three times a day (TID) | INTRAMUSCULAR | Status: DC | PRN
  Administered 2016-07-17 – 2016-07-19 (×3): 4 mg via INTRAVENOUS
  Filled 2016-07-16 (×3): qty 2

## 2016-07-16 MED ORDER — SODIUM CHLORIDE 0.9 % IV SOLN
Freq: Once | INTRAVENOUS | Status: AC
  Administered 2016-07-16: 14:00:00 via INTRAVENOUS
  Filled 2016-07-16: qty 1000

## 2016-07-16 MED ORDER — DEXTROSE 5 % IV SOLN
1.0000 g | INTRAVENOUS | Status: AC
  Administered 2016-07-16: 1 g via INTRAVENOUS
  Filled 2016-07-16: qty 1

## 2016-07-16 MED ORDER — FLUTICASONE PROPIONATE 50 MCG/ACT NA SUSP
1.0000 | Freq: Two times a day (BID) | NASAL | Status: DC | PRN
  Filled 2016-07-16: qty 16

## 2016-07-16 MED ORDER — HYDROMORPHONE HCL 4 MG PO TABS: 4.0000 mg | ORAL_TABLET | ORAL | Status: DC | PRN

## 2016-07-16 MED ORDER — ENOXAPARIN SODIUM 40 MG/0.4ML ~~LOC~~ SOLN
40.0000 mg | SUBCUTANEOUS | Status: DC
  Administered 2016-07-16 – 2016-07-18 (×3): 40 mg via SUBCUTANEOUS
  Filled 2016-07-16 (×3): qty 0.4

## 2016-07-16 MED ORDER — ACETAMINOPHEN 325 MG PO TABS
650.0000 mg | ORAL_TABLET | ORAL | Status: DC | PRN
  Administered 2016-07-17: 650 mg via ORAL
  Filled 2016-07-16: qty 2

## 2016-07-16 MED ORDER — SENNOSIDES-DOCUSATE SODIUM 8.6-50 MG PO TABS: 1.0000 | ORAL_TABLET | Freq: Every evening | ORAL | Status: DC | PRN

## 2016-07-16 MED ORDER — MAGIC MOUTHWASH
10.0000 mL | Freq: Four times a day (QID) | ORAL | Status: DC | PRN
  Filled 2016-07-16: qty 10

## 2016-07-16 MED ORDER — DEXAMETHASONE 4 MG PO TABS
4.0000 mg | ORAL_TABLET | Freq: Every day | ORAL | Status: DC
  Administered 2016-07-17 – 2016-07-19 (×3): 4 mg via ORAL
  Filled 2016-07-16 (×3): qty 1

## 2016-07-16 MED ORDER — ONDANSETRON HCL 4 MG/2ML IJ SOLN
8.0000 mg | Freq: Once | INTRAMUSCULAR | Status: AC
  Administered 2016-07-16: 8 mg via INTRAVENOUS

## 2016-07-16 MED ORDER — HYDROMORPHONE HCL 1 MG/ML IJ SOLN
1.0000 mg | INTRAMUSCULAR | Status: DC | PRN
  Administered 2016-07-17: 1 mg via INTRAVENOUS
  Filled 2016-07-16: qty 1

## 2016-07-16 MED ORDER — DEXTROSE 5 % IV SOLN
1.0000 g | Freq: Two times a day (BID) | INTRAVENOUS | Status: DC
  Administered 2016-07-17 – 2016-07-19 (×5): 1 g via INTRAVENOUS
  Filled 2016-07-16 (×6): qty 1

## 2016-07-16 MED ORDER — ONDANSETRON HCL 4 MG/2ML IJ SOLN
INTRAMUSCULAR | Status: AC
  Filled 2016-07-16: qty 4

## 2016-07-16 MED ORDER — ONDANSETRON 4 MG PO TBDP: 4.0000 mg | ORAL_TABLET | Freq: Three times a day (TID) | ORAL | Status: DC | PRN

## 2016-07-16 MED ORDER — ONDANSETRON HCL 4 MG PO TABS: 4.0000 mg | ORAL_TABLET | Freq: Three times a day (TID) | ORAL | Status: DC | PRN

## 2016-07-16 MED ORDER — SODIUM CHLORIDE 0.9 % IV SOLN
INTRAVENOUS | Status: DC
  Administered 2016-07-16: 10:00:00 via INTRAVENOUS

## 2016-07-16 NOTE — Progress Notes (Signed)
Pt states she does not feel well this morning-more nausea and vomiting last night and this morning. Not eating as much but able to tolerate some fluids. Not sleeping well, legs ache and states she felt her heart race during the night.  Spoke with Dr. Alvy Bimler. Adding labs on to today's visit. Pt to continue Zofran OTC every 8 hours followed in 1 hour after each zofran with her Ativan sublingual.   Additional fluids on Saturday (tomorrow), Monday and Tuesday of next week.   Urgent LOS sent sent for labs and  IVF appts.  Advised pt and her husband of above instructions.  Labs reveal K+ of 2.1  Dr. Alvy Bimler made aware.  Pt to be admitted for K+ replacement, ongoing nausea and vomiting.  Pt and husband aware.   Pt transported to room via w/c with husband in attendance  _______________________  This was a nursing visit only.

## 2016-07-16 NOTE — Progress Notes (Addendum)
Pharmacy Antibiotic Note  Jessica [name] is a 65 y.o. female with hx multiple myeloma s/p stem cell transplant admitted on 07/16/2016 with worsening PNA despite oral levaquin.  Pharmacy has been consulted for Cefepime dosing. Discussed penicillin allergy with MD and will trial cephalosporin as low chance of cross-reactivity.  RN to watch closely with first doses.    SCr = 0.9, CrCl ~45 ml/min  Plan: Cefepime 1g IV q12h F/u renal function, cultures, clinical course     Temp (24hrs), Avg:98 F (36.7 C), Min:97.7 F (36.5 C), Max:98.2 F (36.8 C)   Recent Labs Lab 07/12/16 1039 07/12/16 1039 07/16/16 1034 07/16/16 1034  WBC  --  8.1 6.6  --   CREATININE 1.2*  --   --  0.9    Estimated Creatinine Clearance: 44.7 mL/min (by C-G formula based on SCr of 0.9 mg/dL).    Allergies  Allergen Reactions  . Augmentin [Amoxicillin-Pot Clavulanate] Hives, Itching, Nausea And Vomiting and Rash    Has patient had a PCN reaction causing immediate rash, facial/tongue/throat swelling, SOB or lightheadedness with hypotension: Yes Has patient had a PCN reaction causing severe rash involving mucus membranes or skin necrosis: Yes Has patient had a PCN reaction that required hospitalization No Has patient had a PCN reaction occurring within the last 10 years: Yes If all of the above answers are "NO", then may proceed with Cephalosporin use. *okay to take other types of cillins*  . Albuterol Other (See Comments)    Elevated HR and BP  . Codeine Nausea Only  . Ibuprofen Nausea Only  . Nsaids Other (See Comments)    Gi Upset    Antimicrobials this admission: 12/8 Cefepime >>   Dose adjustments this admission:  Microbiology results:   Thank you for allowing pharmacy to be a part of this patient's care.  Ralene Bathe, PharmD, BCPS 07/16/2016, 1:45 PM  Pager: [phone]

## 2016-07-16 NOTE — H&P (Signed)
[facility] ADMISSION NOTE  Patient Care Team: Josetta Huddle, MD as PCP - General (Internal Medicine) Ginette Pitman, MD as Consulting Physician (Hematology and Oncology) Hessie Dibble, MD as Consulting Physician (Hematology and Oncology)  CHIEF COMPLAINTS/PURPOSE OF ADMISSION Pneumonia, uncontrolled nausea and vomiting, severe hypokalemic anemia, failed outpatient treatment  HISTORY OF PRESENTING ILLNESS:  Jessica [name] 65 y.o. female is admitted for further management of symptoms above Summary of oncologic history as follows: Oncology History    M-protein 0.69 gm/dl IFIX - IgG, Kappa IgG - 868 IgA - 19 IgM - < 20 Kappa - 21 Lambda - 5.7  09/06/2014 - Bone marrow aspirate and biopsy:   Normocellular marrow for age (40%) with a small monoclonal plasma cell population (1% on aspirate). Karyotype 30, XX  FISH Negative for myeloma associated changes  09/12/2014 - PET/CT  Two regions that are concerning for disease, one adjacent/involving the left ninth rib and one in the marrow of the right femur, in this patient with history of plasmacytoma.      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      09/06/2014 Bone Marrow Biopsy    BM performed at [facility] is not consistent with multiple myeloma, 1% plasma cell on aspirate      09/12/2014 Imaging     PET CT scan show involvement of left ninth rib and right femur      09/23/2014 - 10/23/2014 Radiation Therapy     she had radiation therapy to the cavernous sinus and skull base lesions, 45 Gy      10/21/2014 - 11/01/2014 Radiation Therapy      she had radiation to right femur , total 30 Gy      11/26/2014 - 02/14/2015 Chemotherapy     she is started on weekly dexamethasone, Velcade twice a week on day 1, 4, 8 and 11 and Revlimid days 1-14.      04/01/2015 Bone Marrow Transplant    She received melphalan chemotherapy on 03/31/2015 followed by autologous stem cell transplant the day after      04/03/2015 - 04/18/2015 Hospital Admission    The patient was admitted to the hospital at [facility] for management related to complication from stem cell transplant. She had significant nausea requiring intravenous anti-emetics.      07/17/2015 -  Chemotherapy    She started maintenance Revlimid and monthly zometa      Please see my detailed dictation over the past few days for further details. This patient received IV Zometa last week. Starting early this week, she started to feel unwell. I have ordered recent chest x-ray and further evaluation and found out that the patient has pneumonia. She was prescribed oral antibiotics with levofloxacin, IV fluids and IV anti-emetics. Despite coming to the cancer center on a daily basis, she continues to have profound uncontrolled nausea and vomiting and dehydration. Her cough is not improving while on levofloxacin. Her oral intake is very poor and she have persistent vomiting and unable to keep her medications down. She started to experience irregular heartbeat at home, along with profound  weakness. She is admitted for IV antibiotics, IV electrolyte replacement, IV fluid support and IV anti-emetics over the weekend  MEDICAL HISTORY:  Past Medical History:  Diagnosis Date  . H/O stem cell transplant [facility]) 03/2015   [facility]  . Multiple myeloma ([place]) 11/19/2014  . Multiple myeloma ([place])   . Multiple myeloma ([place])   . MVP (mitral valve prolapse)    req prophylaxis  . Other constipation 12/03/2014  . Right ovarian cyst 10/16/15   16 mm simple cyst - ultrasound yearly.  [facility] Upper respiratory  infection, acute 08/12/2015    SURGICAL HISTORY: Past Surgical History:  Procedure Laterality Date  . CATARACT EXTRACTION, BILATERAL  10/2010   Implants(ReSTOR) Bilat  . LASER ABLATION OF CONDYLOMAS  1990   CIN 1 cervix  . multiple myeloma surgery Right 08/2014   [facility]    SOCIAL HISTORY: Social History   Social History  . Marital status: Married    Spouse name: N/A  . Number of children: N/A  . Years of education: N/A   Occupational History  . Not on file.   Social History Main Topics  . Smoking status: Never Smoker  . Smokeless tobacco: Never Used  . Alcohol use No  . Drug use: No  . Sexual activity: Not Currently    Partners: Male    Birth control/ protection: Post-menopausal     Comment: vasectomy   Other Topics Concern  . Not on file   Social History Narrative  . No narrative on file    FAMILY HISTORY: Family History  Problem Relation Age of Onset  . Osteoporosis Mother   . Diabetes Father     ALLERGIES:  is allergic to albuterol; codeine; ibuprofen; nsaids; and augmentin [amoxicillin-pot clavulanate].  MEDICATIONS:  Current Facility-Administered Medications  Medication Dose Route Frequency Provider Last Rate Last Dose  . 0.9 %  sodium chloride infusion   Intravenous Continuous [name], MD      . acetaminophen (TYLENOL) tablet 650 mg  650 mg Oral Q4H PRN [name], MD      . dexamethasone (DECADRON) tablet 4 mg  4 mg Oral Daily Casson Catena, MD      . enoxaparin (LOVENOX) injection 40 mg  40 mg Subcutaneous Q24H Oria Klimas, MD      . fluticasone (FLONASE) 50 MCG/ACT nasal spray 1 spray  1 spray Each Nare BID Suprena Travaglini, MD      . HYDROmorphone (DILAUDID) injection 1 mg  1 mg Intravenous Q3H PRN [name], MD      . HYDROmorphone (DILAUDID) tablet 4 mg  4 mg Oral Q4H PRN [name], MD      . LORazepam (ATIVAN) tablet 0.5 mg  0.5 mg Oral Q8H PRN [name], MD      . magic mouthwash  10 mL Oral QID PRN [name], MD      . ondansetron  (ZOFRAN) tablet 4-8 mg  4-8 mg Oral Q8H PRN [name], MD       Or  . ondansetron (ZOFRAN-ODT) disintegrating tablet 4-8 mg  4-8 mg Oral Q8H PRN [name], MD       Or  . ondansetron (ZOFRAN) injection 4 mg  4 mg Intravenous Q8H PRN Neilan Rizzo, MD       Or  . ondansetron (ZOFRAN) 8 mg in sodium chloride 0.9 % 50 mL IVPB  8 mg Intravenous Q8H PRN Devera Englander, MD      . senna-docusate (Senokot-S) tablet 1 tablet  1  tablet Oral QHS PRN [name], MD      . sodium chloride 0.9 % 1,000 mL with potassium chloride 80 mEq infusion   Intravenous Once [name], MD       Facility-Administered Medications Ordered in Other Encounters  Medication Dose Route Frequency Provider Last Rate Last Dose  . 0.9 %  sodium chloride infusion   Intravenous Continuous [name], MD   Stopped at 07/16/16 1230    REVIEW OF SYSTEMS:   Constitutional: Denies fevers, chills or abnormal night sweats Eyes: Denies blurriness of vision, double vision or watery eyes Ears, nose, mouth, throat, and face: Denies mucositis or sore throat Skin: Denies abnormal skin rashes Lymphatics: Denies new lymphadenopathy or easy bruising Neurological:Denies numbness, tingling  Behavioral/Psych: Mood is stable, no new changes  All other systems were reviewed with the patient and are negative.  PHYSICAL EXAMINATION: ECOG PERFORMANCE STATUS: 2 - Symptomatic, <50% confined to bed  Vitals:   07/16/16 1313  BP: 139/75  Pulse: 60  Resp: 18  Temp: 98.2 F (36.8 C)   There were no vitals filed for this visit.  GENERAL:alert, no distress and comfortable. She looks thin, pale and frail  SKIN: skin color, texture, turgor are normal, no rashes or significant lesions EYES: normal, conjunctiva are pink and non-injected, sclera clear OROPHARYNX:no exudate, no erythema and lips, buccal mucosa, and tongue normal  NECK: supple, thyroid normal size, non-tender, without nodularity LYMPH:  no palpable lymphadenopathy in the cervical, axillary  or inguinal LUNGS: clear to auscultation and percussion with normal breathing effort HEART: regular rate & rhythm and no murmurs and no lower extremity edema ABDOMEN:abdomen soft, non-tender and normal bowel sounds Musculoskeletal:no cyanosis of digits and no clubbing  PSYCH: alert & oriented x 3 with fluent speech NEURO: no focal motor/sensory deficits  LABORATORY DATA:  I have reviewed the data as listed Lab Results  Component Value Date   WBC 6.6 07/16/2016   HGB 9.1 (L) 07/16/2016   HCT 27.2 (L) 07/16/2016   MCV 82.9 07/16/2016   PLT 206 07/16/2016    Recent Labs  06/30/16 1233 06/30/16 1233 07/12/16 1039 07/16/16 1034  NA 143  --  137 142  K 4.1  --  3.4* 2.1*  CO2 22  --  15* 20*  GLUCOSE 84  --  94 86  BUN 13.4  --  12.6 15.6  CREATININE 0.8  --  1.2* 0.9  CALCIUM 9.1  --  9.1 8.3*  PROT 6.2* 5.9* 7.3 6.2*  ALBUMIN 3.4*  --  3.1* 2.6*  AST 19  --  49* 33  ALT 17  --  54 39  ALKPHOS 101  --  242* 176*  BILITOT 0.32  --  0.76 0.34    RADIOGRAPHIC STUDIES: I have personally reviewed the radiological images as listed and agreed with the findings in the report. Dg Chest 2 View  Result Date: 07/12/2016 CLINICAL DATA:  Chest pain history of myeloma. EXAM: CHEST  2 VIEW COMPARISON:  No recent prior. FINDINGS: Mediastinum hilar structures are normal. Heart size normal. Ill-defined infiltrates noted in the superior segment of the left lower lobe in the left lung base. These changes most likely secondary to pneumonia. Given the peripheral nature of these densities pulmonary embolus cannot be excluded. Small left pleural effusion cannot be excluded. Questionable mild deformity noted of the left posterior eighth and ninth ribs. These changes may be related to myeloma given the patient's prior history. Other etiologies of bony abnormality including metastatic  disease cannot be completely excluded. IMPRESSION: 1. Infiltrate in the superior segment left lower lobe and over the left  lung base, most consistent with pneumonia. Given the peripheral nature of these infiltrates pulmonary embolus cannot be completely excluded. Close follow-up chest x-rays recommended to demonstrate clearing. Contrast-enhanced chest CT can be obtained for further evaluation as needed. 2. Questionable deformity noted of the left posterior eighth and ninth ribs. Given patient's history of myeloma, myelomatous involvement of the ribs cannot be excluded. Electronically Signed   By: Marcello Moores  Register   On: 07/12/2016 13:08   Ct Chest W Contrast  Result Date: 07/13/2016 CLINICAL DATA:  Pleuritic chest pain and nausea and vomiting. Abnormal left lung opacity on chest radiograph. Multiple myeloma in remission. EXAM: CT CHEST WITH CONTRAST TECHNIQUE: Multidetector CT imaging of the chest was performed during intravenous contrast administration. CONTRAST:  30m ISOVUE-300 IOPAMIDOL (ISOVUE-300) INJECTION 61% COMPARISON:  Chest radiograph on 07/12/2016 FINDINGS: Cardiovascular:  No acute findings. Aortic atherosclerosis. Mediastinum/Nodes: No masses or pathologically enlarged lymph nodes identified. Lungs/Pleura: Airspace consolidation is seen predominately involving the left lower lobe with minimal involvement of the inferior posterior lingula. Central air bronchograms are demonstrated. This is most consistent with pneumonia. Right lung is clear. No pulmonary masses are identified. No evidence of pleural effusion. Upper Abdomen: Several tiny sub-cm hepatic cysts are noted. Normal adrenal glands. Musculoskeletal:  No suspicious bone lesions. IMPRESSION: Left lung airspace consolidation with predominant involvement of the left lower lobe, consistent with pneumonia. No evidence of centrally obstructing mass, lymphadenopathy, or pleural effusion. Electronically Signed   By: JEarle GellM.D.   On: 07/13/2016 15:36    ASSESSMENT & PLAN:   Multiple myeloma in remission ([facility] Per discussion last week, will continue to hold  Revlimid  Pneumonia, community acquired She had abnormal chest x-ray. She had atypical pleuritic chest pain. CT angiogram excluded PE but show evidence of pneumonia. This is likely related to side effects from recent treatment causing increased risk of infection. Her symptoms are not improving on oral levofloxacin She has uncontrolled nausea and vomiting and I felt that given her frail status, I would like to switch her antibiotic coverage to IV cefepime. I will consult pharmacy for assistance on dosing  Atypical chest pain She has atypical chest pain, responded well to Dilaudid. I gave her prescription of Dilaudid to take. I warned her risk of constipation and sedation  Nausea and vomiting She had profound nausea and vomiting recently. Despite daily IV fluids and IV anti-emetics, she is not getting better. She is admitted for continuous IV fluid hydration along with IV antiemetics  Severe hypokalemia Due to poor oral intake. There is no way she can tolerate oral potassium replacement therapy by mouth. I will get her admitted for IV potassium replacement therapy  Moderate protein calorie malnutrition Encouraged her to take frequent small meals  I will consult dietitian while she is hospitalized for nutritional supplement   Profound weakness  Likely due to severe diarrhea and pneumonia  Will consult PT   Anemia of chronic illness  This is likely anemia of chronic disease. The patient denies recent history of bleeding such as epistaxis, hematuria or hematochezia. She is asymptomatic from the anemia. We will observe for now.  She does not require transfusion now.   DVT prophylaxis  On Lovenox   CODE STATUS  Full code   Discharge planning  She will likely stay over the weekend and discharge next week once she has adequate oral intake  [name], MD 07/16/2016 1:18 PM

## 2016-07-16 NOTE — Patient Instructions (Signed)
Dehydration, Adult Dehydration is a condition in which there is not enough fluid or water in the body. This happens when you lose more fluids than you take in. Important organs, such as the kidneys, brain, and heart, cannot function without a proper amount of fluids. Any loss of fluids from the body can lead to dehydration. Dehydration can range from mild to severe. This condition should be treated right away to prevent it from becoming severe. What are the causes? This condition may be caused by:  Vomiting.  Diarrhea.  Excessive sweating, such as from heat exposure or exercise.  Not drinking enough fluid, especially:  When ill.  While doing activity that requires a lot of energy.  Excessive urination.  Fever.  Infection.  Certain medicines, such as medicines that cause the body to lose excess fluid (diuretics).  Inability to access safe drinking water.  Reduced physical ability to get adequate water and food. What increases the risk? This condition is more likely to develop in people:  Who have a poorly controlled long-term (chronic) illness, such as diabetes, heart disease, or kidney disease.  Who are age 65 or older.  Who are disabled.  Who live in a place with high altitude.  Who play endurance sports. What are the signs or symptoms? Symptoms of mild dehydration may include:   Thirst.  Dry lips.  Slightly dry mouth.  Dry, warm skin.  Dizziness. Symptoms of moderate dehydration may include:   Very dry mouth.  Muscle cramps.  Dark urine. Urine may be the color of tea.  Decreased urine production.  Decreased tear production.  Heartbeat that is irregular or faster than normal (palpitations).  Headache.  Light-headedness, especially when you stand up from a sitting position.  Fainting (syncope). Symptoms of severe dehydration may include:   Changes in skin, such as:  Cold and clammy skin.  Blotchy (mottled) or pale skin.  Skin that does  not quickly return to normal after being lightly pinched and released (poor skin turgor).  Changes in body fluids, such as:  Extreme thirst.  No tear production.  Inability to sweat when body temperature is high, such as in hot weather.  Very little urine production.  Changes in vital signs, such as:  Weak pulse.  Pulse that is more than 100 beats a minute when sitting still.  Rapid breathing.  Low blood pressure.  Other changes, such as:  Sunken eyes.  Cold hands and feet.  Confusion.  Lack of energy (lethargy).  Difficulty waking up from sleep.  Short-term weight loss.  Unconsciousness. How is this diagnosed? This condition is diagnosed based on your symptoms and a physical exam. Blood and urine tests may be done to help confirm the diagnosis. How is this treated? Treatment for this condition depends on the severity. Mild or moderate dehydration can often be treated at home. Treatment should be started right away. Do not wait until dehydration becomes severe. Severe dehydration is an emergency and it needs to be treated in a hospital. Treatment for mild dehydration may include:   Drinking more fluids.  Replacing salts and minerals in your blood (electrolytes) that you may have lost. Treatment for moderate dehydration may include:   Drinking an oral rehydration solution (ORS). This is a drink that helps you replace fluids and electrolytes (rehydrate). It can be found at pharmacies and retail stores. Treatment for severe dehydration may include:   Receiving fluids through an IV tube.  Receiving an electrolyte solution through a feeding tube that is   passed through your nose and into your stomach (nasogastric tube, or NG tube).  Correcting any abnormalities in electrolytes.  Treating the underlying cause of dehydration. Follow these instructions at home:  If directed by your health care provider, drink an ORS:  Make an ORS by following instructions on the  package.  Start by drinking small amounts, about  cup (120 mL) every 5-10 minutes.  Slowly increase how much you drink until you have taken the amount recommended by your health care provider.  Drink enough clear fluid to keep your urine clear or pale yellow. If you were told to drink an ORS, finish the ORS first, then start slowly drinking other clear fluids. Drink fluids such as:  Water. Do not drink only water. Doing that can lead to having too little salt (sodium) in the body (hyponatremia).  Ice chips.  Fruit juice that you have added water to (diluted fruit juice).  Low-calorie sports drinks.  Avoid:  Alcohol.  Drinks that contain a lot of sugar. These include high-calorie sports drinks, fruit juice that is not diluted, and soda.  Caffeine.  Foods that are greasy or contain a lot of fat or sugar.  Take over-the-counter and prescription medicines only as told by your health care provider.  Do not take sodium tablets. This can lead to having too much sodium in the body (hypernatremia).  Eat foods that contain a healthy balance of electrolytes, such as bananas, oranges, potatoes, tomatoes, and spinach.  Keep all follow-up visits as told by your health care provider. This is important. Contact a health care provider if:  You have abdominal pain that:  Gets worse.  Stays in one area (localizes).  You have a rash.  You have a stiff neck.  You are more irritable than usual.  You are sleepier or more difficult to wake up than usual.  You feel weak or dizzy.  You feel very thirsty.  You have urinated only a small amount of very dark urine over 6-8 hours. Get help right away if:  You have symptoms of severe dehydration.  You cannot drink fluids without vomiting.  Your symptoms get worse with treatment.  You have a fever.  You have a severe headache.  You have vomiting or diarrhea that:  Gets worse.  Does not go away.  You have blood or green matter  (bile) in your vomit.  You have blood in your stool. This may cause stool to look black and tarry.  You have not urinated in 6-8 hours.  You faint.  Your heart rate while sitting still is over 100 beats a minute.  You have trouble breathing. This information is not intended to replace advice given to you by your health care provider. Make sure you discuss any questions you have with your health care provider. Document Released: 07/26/2005 Document Revised: 02/20/2016 Document Reviewed: 09/19/2015 [employer] Interactive Patient Education  2017 [employer]  

## 2016-07-16 NOTE — Progress Notes (Signed)
Initial Nutrition Assessment  DOCUMENTATION CODES:   Not applicable  INTERVENTION:  - Continue to encourage PO intakes with resolving N/V. - RD will follow-up 12/11 and discuss oral nutrition supplements at that time.  NUTRITION DIAGNOSIS:   Inadequate oral intake related to acute illness, vomiting as evidenced by per patient/family report.  GOAL:   Patient will meet greater than or equal to 90% of their needs  MONITOR:   PO intake, Weight trends, Labs, I & O's  REASON FOR ASSESSMENT:   Consult Assessment of nutrition requirement/status  ASSESSMENT:   65 y.o. female with hx multiple myeloma s/p stem cell transplant admitted on 07/16/2016 with worsening PNA despite oral levaquin. Also admitted for hypokalemia and N/V and inability to keep anything down.   Pt seen for consult. BMI indicates normal weight. Estimated nutrition needs based on weight from 05/03/16 (113 lbs, 51.3 kg) as pt reports she weighed this until the end of November and that over the past 1-2 weeks she has been gaining weight despite decreased intakes. Pt unsure of weight hx overall or of UBW other than weighing 113 lbs recently. Per chart review, weight was stable (111-116 lbs) since March 2017.   Pt reports that she has chronic nausea which has been ongoing for several years. She has been on home anti-nausea medication that has changed several times over the past few years but has recently controlled nausea well. She states that she very rarely has vomiting but that last week she began to have intermittent vomiting with PO intakes of foods, drinks, and medications. She is unable to determine any items that consistently caused this. During that time frame, she often needed to sit for ~30 minutes after eating as to not exacerbate nausea and lead to vomiting.   Pt reports that for lunch today she had most of a bowl of cream of potato soup and 1.5 saltine crackers. She did feel nauseated after eating and was given PRN  IV Zofran at that time which has controlled nausea; no vomiting with meal.   Physical assessment shows no muscle or fat wasting and no edema at this time. She reports no BM for 3 days PTA.   Pt reports she has tried Ensure and Boost in the past and does not like these supplements. Informed her that at follow-up RD will talk about other oral nutrition supplements; pt interested in waiting until symptoms resolve and she is tolerating more foods before this discussion.  Medications reviewed; PRN Senokot, PRN oral and IV Zofran.  Labs reviewed; K: 2.1 mmol/L, Cl: 110 mmol/L, Ca: 8.3 mg/dL, Alk Phos elevated.   IVF: NS @ 50 mL/hr.      Diet Order:  Diet regular Room service appropriate? Yes; Fluid consistency: Thin  Skin:  Reviewed, no issues  Last BM:  PTA/unknown  Height:   Ht Readings from Last 1 Encounters:  07/16/16 5' 0.25" (1.53 m)    Weight:   Wt Readings from Last 1 Encounters:  07/16/16 116 lb 4.8 oz (52.8 kg)    Ideal Body Weight:  45.45 kg  BMI:  There is no height or weight on file to calculate BMI.  Estimated Nutritional Needs:   Kcal:  1335-1540 (26-30 kcal/kg)  Protein:  50-60 grams  Fluid:  >/= 1.7 L/day  EDUCATION NEEDS:   No education needs identified at this time    Jarome Matin, MS, RD, LDN, CNSC Inpatient Clinical Dietitian Pager # [phone] After hours/weekend pager # [phone]

## 2016-07-17 ENCOUNTER — Ambulatory Visit: Payer: Medicare Other

## 2016-07-17 DIAGNOSIS — R112 Nausea with vomiting, unspecified: Secondary | ICD-10-CM

## 2016-07-17 DIAGNOSIS — E876 Hypokalemia: Secondary | ICD-10-CM

## 2016-07-17 DIAGNOSIS — C9001 Multiple myeloma in remission: Secondary | ICD-10-CM

## 2016-07-17 DIAGNOSIS — D61818 Other pancytopenia: Secondary | ICD-10-CM

## 2016-07-17 LAB — CBC WITH DIFFERENTIAL/PLATELET
Basophils Absolute: 0 K/uL (ref 0.0–0.1)
Basophils Relative: 0 %
Eosinophils Absolute: 0 K/uL (ref 0.0–0.7)
Eosinophils Relative: 1 %
HCT: 24.3 % — ABNORMAL LOW (ref 36.0–46.0)
Hemoglobin: 8.3 g/dL — ABNORMAL LOW (ref 12.0–15.0)
Lymphocytes Relative: 25 %
Lymphs Abs: 0.9 K/uL (ref 0.7–4.0)
MCH: 28.2 pg (ref 26.0–34.0)
MCHC: 34.2 g/dL (ref 30.0–36.0)
MCV: 82.7 fL (ref 78.0–100.0)
Monocytes Absolute: 0.6 K/uL (ref 0.1–1.0)
Monocytes Relative: 16 %
Neutro Abs: 2.1 K/uL (ref 1.7–7.7)
Neutrophils Relative %: 58 %
Platelets: 198 K/uL (ref 150–400)
RBC: 2.94 MIL/uL — ABNORMAL LOW (ref 3.87–5.11)
RDW: 16.4 % — ABNORMAL HIGH (ref 11.5–15.5)
WBC: 3.5 K/uL — ABNORMAL LOW (ref 4.0–10.5)

## 2016-07-17 LAB — COMPREHENSIVE METABOLIC PANEL WITH GFR
ALT: 32 U/L (ref 14–54)
AST: 26 U/L (ref 15–41)
Albumin: 2.8 g/dL — ABNORMAL LOW (ref 3.5–5.0)
Alkaline Phosphatase: 116 U/L (ref 38–126)
Anion gap: 8 (ref 5–15)
BUN: 11 mg/dL (ref 6–20)
CO2: 21 mmol/L — ABNORMAL LOW (ref 22–32)
Calcium: 7.6 mg/dL — ABNORMAL LOW (ref 8.9–10.3)
Chloride: 113 mmol/L — ABNORMAL HIGH (ref 101–111)
Creatinine, Ser: 0.73 mg/dL (ref 0.44–1.00)
GFR calc Af Amer: >60 mL/min
GFR calc non Af Amer: >60 mL/min
Glucose, Bld: 92 mg/dL (ref 65–99)
Potassium: 2.7 mmol/L — CL (ref 3.5–5.1)
Sodium: 142 mmol/L (ref 135–145)
Total Bilirubin: 0.6 mg/dL (ref 0.3–1.2)
Total Protein: 5.6 g/dL — ABNORMAL LOW (ref 6.5–8.1)

## 2016-07-17 LAB — MAGNESIUM: Magnesium: 2 mg/dL (ref 1.7–2.4)

## 2016-07-17 MED ORDER — DIPHENOXYLATE-ATROPINE 2.5-0.025 MG PO TABS: 1.0000 | ORAL_TABLET | ORAL | Status: DC | PRN

## 2016-07-17 MED ORDER — SODIUM CHLORIDE 0.9 % IV SOLN
Freq: Once | INTRAVENOUS | Status: AC
  Administered 2016-07-17: 13:00:00 via INTRAVENOUS
  Filled 2016-07-17: qty 1000

## 2016-07-17 NOTE — Progress Notes (Signed)
CRITICAL VALUE ALERT  Critical value received: 2.7  Date of notification:  07/17/16  Time of notification: [id number]  Critical value read back:Yes  Nurse who received alert:  Bo Mcclintock  MD notified (1st page):  Ennever  Time of first page: 0515  MD notified (2nd page):n/a  Time of second page: N/A  Responding MD:  Marin Olp  Time MD responded:  [phone]

## 2016-07-17 NOTE — Evaluation (Signed)
Physical Therapy Evaluation Patient Details Name: Jessica [name] MRN: [id number] DOB: 1951/08/07 Today's Date: 07/17/2016   History of Present Illness  Pt admitted with weakness, PNA, and N/V and hx of multiple myeloma.   Clinical Impression  Pt admitted as above and presenting with functional mobility limitations 2* generalized weakness and mild ambulatory instability.  Pt plans dc home with 24/7 assist of spouse and states she will work on getting stronger with assist of spouse and will request follow up HHPT through [employer] if needed.    Follow Up Recommendations No PT follow up    Equipment Recommendations  None recommended by PT    Recommendations for Other Services       Precautions / Restrictions Precautions Precautions: Fall Restrictions Weight Bearing Restrictions: No      Mobility  Bed Mobility Overal bed mobility: Modified Independent             General bed mobility comments: Supine<>sit unassisted  Transfers Overall transfer level: Needs assistance Equipment used: None Transfers: Sit to/from Stand Sit to Stand: Min guard         General transfer comment: min guard to steady with initial standing  Ambulation/Gait Ambulation/Gait assistance: Min guard;Supervision Ambulation Distance (Feet): 400 Feet Assistive device: Rolling walker (2 wheeled);None;1 person hand held assist Gait Pattern/deviations: Step-through pattern;Decreased step length - right;Decreased step length - left;Shuffle;Trunk flexed Gait velocity: decr Gait velocity interpretation: Below normal speed for age/gender General Gait Details: Pt ambulated 77' sans AD and demonstrating mild intermittent instability and decreased pace.  Largely corrected with use of RW with pt admitting to feeling more stable.  Stairs            Wheelchair Mobility    Modified Rankin (Stroke Patients Only)       Balance Overall balance assessment: Needs assistance Sitting-balance  support: No upper extremity supported;Feet supported Sitting balance-Leahy Scale: Good     Standing balance support: No upper extremity supported Standing balance-Leahy Scale: Fair                               Pertinent Vitals/Pain Pain Assessment: No/denies pain    Home Living Family/patient expects to be discharged to:: Private residence Living Arrangements: Spouse/significant other Available Help at Discharge: Family Type of Home: House       Home Layout: Able to live on main level with bedroom/bathroom Home Equipment: None Additional Comments: Pt states she can borrow any equipment she needs from relatives -     Prior Function Level of Independence: Independent               Hand Dominance        Extremity/Trunk Assessment   Upper Extremity Assessment: Generalized weakness           Lower Extremity Assessment: Generalized weakness      Cervical / Trunk Assessment: Normal  Communication   Communication: No difficulties  Cognition Arousal/Alertness: Awake/alert Behavior During Therapy: WFL for tasks assessed/performed Overall Cognitive Status: Within Functional Limits for tasks assessed                      General Comments      Exercises     Assessment/Plan    PT Assessment Patient needs continued PT services  PT Problem List Decreased strength;Decreased activity tolerance;Decreased balance;Decreased mobility;Decreased knowledge of use of DME          PT Treatment Interventions  DME instruction;Gait training;Stair training;Functional mobility training;Therapeutic activities;Therapeutic exercise;Balance training;Patient/family education    PT Goals (Current goals can be found in the Care Plan section)  Acute Rehab PT Goals Patient Stated Goal: Home with spouse` PT Goal Formulation: With patient Time For Goal Achievement: 07/31/16 Potential to Achieve Goals: Good    Frequency Min 3X/week   Barriers to discharge         Co-evaluation               End of Session Equipment Utilized During Treatment: Gait belt Activity Tolerance: Patient tolerated treatment well Patient left: in bed;with call bell/phone within reach;with bed alarm set Nurse Communication: Mobility status         Time: 1203-1230 PT Time Calculation (min) (ACUTE ONLY): 27 min   Charges:   PT Evaluation $PT Eval Low Complexity: 1 Procedure PT Treatments $Gait Training: 8-22 mins   PT G Codes:        Kina Shiffman 2016/07/27, 2:10 PM

## 2016-07-17 NOTE — Plan of Care (Signed)
Tried to get up by self from chair, states" that tech told me I could get up by myself" States got very shaky and almost fell. Changed to high fall risk. Bed alarm placed on all side rails up and instructed to call. Verbalizes understanding.

## 2016-07-17 NOTE — Progress Notes (Signed)
ARTISHA CAPRI   DOB:1951/03/10   NW#:[id number]   MVH#:[id number]  Oncology follow up note   Subjective: I'm currently my partner Dr. Jaquita Rector such she see the patient over the weekend. She was admitted for pneumonia and hypoglycemia yesterday. She had a quite severe left-sided chest pain last night, improved with pain medication. She feels better overall today. Afebrile, vital signs stable. Her nausea has improved, she was able to eat breakfast this morning, no nausea or vomiting.   Objective:  Vitals:   07/16/16 2055 07/17/16 0601  BP: (!) 146/83 (!) 152/94  Pulse: (!) 52 (!) 51  Resp: 20 16  Temp: 97.3 F (36.3 C) 97.7 F (36.5 C)    Body mass index is 22.53 kg/m.  Intake/Output Summary (Last 24 hours) at 07/17/16 1218 Last data filed at 07/17/16 8413  Gross per 24 hour  Intake          2026.66 ml  Output             1450 ml  Net           576.66 ml     Sclerae unicteric  Oropharynx clear  No peripheral adenopathy  Lungs clear -- no rales or rhonchi  Heart regular rate and rhythm  Abdomen benign  MSK no focal spinal tenderness, no peripheral edema  Neuro nonfocal   CBG (last 3)  No results for input(s): GLUCAP in the last 72 hours.   Labs:  Lab Results  Component Value Date   WBC 3.5 (L) 07/17/2016   HGB 8.3 (L) 07/17/2016   HCT 24.3 (L) 07/17/2016   MCV 82.7 07/17/2016   PLT 198 07/17/2016   NEUTROABS 2.1 07/17/2016   CMP Latest Ref Rng & Units 07/17/2016 07/16/2016 07/12/2016  Glucose 65 - 99 mg/dL 92 86 94  BUN 6 - 20 mg/dL 11 15.6 12.6  Creatinine 0.44 - 1.00 mg/dL 0.73 0.9 1.2(H)  Sodium 135 - 145 mmol/L 142 142 137  Potassium 3.5 - 5.1 mmol/L 2.7(LL) 2.1(LL) 3.4(L)  Chloride 101 - 111 mmol/L 113(H) - -  CO2 22 - 32 mmol/L 21(L) 20(L) 15(L)  Calcium 8.9 - 10.3 mg/dL 7.6(L) 8.3(L) 9.1  Total Protein 6.5 - 8.1 g/dL 5.6(L) 6.2(L) 7.3  Total Bilirubin 0.3 - 1.2 mg/dL 0.6 0.34 0.76  Alkaline Phos 38 - 126 U/L 116 176(H) 242(H)  AST 15 - 41 U/L 26 33 49(H)  ALT  14 - 54 U/L 32 39 54     Urine Studies No results for input(s): UHGB, CRYS in the last 72 hours.  Invalid input(s): UACOL, UAPR, USPG, UPH, UTP, UGL, UKET, UBIL, UNIT, UROB, ULEU, UEPI, UWBC, URBC, UBAC, CAST, [place], [place]  Basic Metabolic Panel:  Recent Labs Lab 07/12/16 1039 07/16/16 1034 07/17/16 0415  NA 137 142 142  K 3.4* 2.1* 2.7*  CL  --   --  113*  CO2 15* 20* 21*  GLUCOSE 94 86 92  BUN 12.6 15.6 11  CREATININE 1.2* 0.9 0.73  CALCIUM 9.1 8.3* 7.6*  MG  --  2.2 2.0   GFR Estimated Creatinine Clearance: 50.3 mL/min (by C-G formula based on SCr of 0.73 mg/dL). Liver Function Tests:  Recent Labs Lab 07/12/16 1039 07/16/16 1034 07/17/16 0415  AST 49* 33 26  ALT 54 39 32  ALKPHOS 242* 176* 116  BILITOT 0.76 0.34 0.6  PROT 7.3 6.2* 5.6*  ALBUMIN 3.1* 2.6* 2.8*   No results for input(s): LIPASE, AMYLASE in the last 168 hours. No  results for input(s): AMMONIA in the last 168 hours. Coagulation profile No results for input(s): INR, PROTIME in the last 168 hours.  CBC:  Recent Labs Lab 07/12/16 1039 07/16/16 1034 07/17/16 0415  WBC 8.1 6.6 3.5*  NEUTROABS 5.9 4.6 2.1  HGB 10.5* 9.1* 8.3*  HCT 32.6* 27.2* 24.3*  MCV 86.8 82.9 82.7  PLT 191 206 198   Cardiac Enzymes: No results for input(s): CKTOTAL, CKMB, CKMBINDEX, TROPONINI in the last 168 hours. BNP: Invalid input(s): POCBNP CBG: No results for input(s): GLUCAP in the last 168 hours. D-Dimer No results for input(s): DDIMER in the last 72 hours. Hgb A1c No results for input(s): HGBA1C in the last 72 hours. Lipid Profile No results for input(s): CHOL, HDL, LDLCALC, TRIG, CHOLHDL, LDLDIRECT in the last 72 hours. Thyroid function studies No results for input(s): TSH, T4TOTAL, T3FREE, THYROIDAB in the last 72 hours.  Invalid input(s): FREET3 Anemia work up No results for input(s): VITAMINB12, FOLATE, FERRITIN, TIBC, IRON, RETICCTPCT in the last 72 hours. Microbiology Recent Results (from the  past 240 hour(s))  C difficile quick scan w PCR reflex     Status: None   Collection Time: 07/16/16  9:30 PM  Result Value Ref Range Status   C Diff antigen NEGATIVE NEGATIVE Final   C Diff toxin NEGATIVE NEGATIVE Final   C Diff interpretation No C. difficile detected.  Final      Studies:  No results found.  Assessment: 65 y.o. with multiple myeloma in remission, on maintenance Revlimid, was admitted for community-acquired pneumonia, nausea vomiting, failed outpt management.   #1. Community-acquired pneumonia #2 atypical chest pain, likely secondary to #1 #3 nausea and vomiting, improved #4 severe hypokalemia, improving #5 moderate protein and calorie malnutrition, #6 deconditioning #7 anemia of chronic illness, stable    Plan:  -she clinically is doing better, will continue cefepime -Her potassium this morning was 2.7, improved from 2.1 yesterday, she received IV potassium 80 minute equal yesterday, I'll give 100 Mill equal today in 1010m NS -Continue pain management and other symptom management -I will follow up tomorrow   FTruitt Merle MD 07/17/2016  12:18 PM

## 2016-07-18 LAB — COMPREHENSIVE METABOLIC PANEL
ALT: 23 U/L (ref 14–54)
AST: 27 U/L (ref 15–41)
Albumin: 2.6 g/dL — ABNORMAL LOW (ref 3.5–5.0)
Alkaline Phosphatase: 106 U/L (ref 38–126)
Anion gap: 8 (ref 5–15)
BUN: 12 mg/dL (ref 6–20)
CO2: 18 mmol/L — ABNORMAL LOW (ref 22–32)
Calcium: 8.1 mg/dL — ABNORMAL LOW (ref 8.9–10.3)
Chloride: 113 mmol/L — ABNORMAL HIGH (ref 101–111)
Creatinine, Ser: 0.68 mg/dL (ref 0.44–1.00)
GFR calc Af Amer: >60 mL/min (ref >60)
GFR calc non Af Amer: >60 mL/min (ref >60)
Glucose, Bld: 87 mg/dL (ref 65–99)
Potassium: 4.7 mmol/L (ref 3.5–5.1)
Sodium: 139 mmol/L (ref 135–145)
Total Bilirubin: 0.5 mg/dL (ref 0.3–1.2)
Total Protein: 5.1 g/dL — ABNORMAL LOW (ref 6.5–8.1)

## 2016-07-18 MED ORDER — POTASSIUM CHLORIDE CRYS ER 20 MEQ PO TBCR
20.0000 meq | EXTENDED_RELEASE_TABLET | Freq: Two times a day (BID) | ORAL | Status: DC
  Administered 2016-07-18 – 2016-07-19 (×3): 20 meq via ORAL
  Filled 2016-07-18 (×3): qty 1

## 2016-07-18 NOTE — Progress Notes (Signed)
Zauria L Glogowski   DOB:04/13/1951   MR#:[id number]   CSN#:[id number]  Oncology follow up note   Subjective: Tomiko overall feels much better today, left-sided chest pain has much improved, she only had a few episodes of nausea, no vomiting, she is eating better, she had loose bowel movement 2-3 times a day yesterday, no abdominal pain. She is afebrile, VS stable, she walk in the hallway this morning. potassium normalizes this morning.  Objective:  Vitals:   07/18/16 0535 07/18/16 0720  BP: (!) 148/81   Pulse: (!) 50   Resp: 20 20  Temp: 98.3 F (36.8 C)     Body mass index is 22.53 kg/m.  Intake/Output Summary (Last 24 hours) at 07/18/16 1154 Last data filed at 07/18/16 0900  Gross per 24 hour  Intake          2383.33 ml  Output                0 ml  Net          2383.33 ml     Sclerae unicteric  Oropharynx clear  No peripheral adenopathy  Lungs clear -- no rales or rhonchi  Heart regular rate and rhythm  Abdomen benign  MSK no focal spinal tenderness, no peripheral edema  Neuro nonfocal   CBG (last 3)  No results for input(s): GLUCAP in the last 72 hours.   Labs:  Lab Results  Component Value Date   WBC 3.5 (L) 07/17/2016   HGB 8.3 (L) 07/17/2016   HCT 24.3 (L) 07/17/2016   MCV 82.7 07/17/2016   PLT 198 07/17/2016   NEUTROABS 2.1 07/17/2016   CMP Latest Ref Rng & Units 07/18/2016 07/17/2016 07/16/2016  Glucose 65 - 99 mg/dL 87 92 86  BUN 6 - 20 mg/dL 12 11 15.6  Creatinine 0.44 - 1.00 mg/dL 0.68 0.73 0.9  Sodium 135 - 145 mmol/L 139 142 142  Potassium 3.5 - 5.1 mmol/L 4.7 2.7(LL) 2.1(LL)  Chloride 101 - 111 mmol/L 113(H) 113(H) -  CO2 22 - 32 mmol/L 18(L) 21(L) 20(L)  Calcium 8.9 - 10.3 mg/dL 8.1(L) 7.6(L) 8.3(L)  Total Protein 6.5 - 8.1 g/dL 5.1(L) 5.6(L) 6.2(L)  Total Bilirubin 0.3 - 1.2 mg/dL 0.5 0.6 0.34  Alkaline Phos 38 - 126 U/L 106 116 176(H)  AST 15 - 41 U/L 27 26 33  ALT 14 - 54 U/L 23 32 39     Urine Studies No results for input(s): UHGB, CRYS in  the last 72 hours.  Invalid input(s): UACOL, UAPR, USPG, UPH, UTP, UGL, UKET, UBIL, UNIT, UROB, ULEU, UEPI, UWBC, URBC, UBAC, CAST, UCOM, BILUA  Basic Metabolic Panel:  Recent Labs Lab 07/12/16 1039 07/16/16 1034 07/17/16 0415 07/18/16 0405  NA 137 142 142 139  K 3.4* 2.1* 2.7* 4.7  CL  --   --  113* 113*  CO2 15* 20* 21* 18*  GLUCOSE 94 86 92 87  BUN 12.6 15.6 11 12  CREATININE 1.2* 0.9 0.73 0.68  CALCIUM 9.1 8.3* 7.6* 8.1*  MG  --  2.2 2.0  --    GFR Estimated Creatinine Clearance: 50.3 mL/min (by C-G formula based on SCr of 0.68 mg/dL). Liver Function Tests:  Recent Labs Lab 07/12/16 1039 07/16/16 1034 07/17/16 0415 07/18/16 0405  AST 49* 33 26 27  ALT 54 39 32 23  ALKPHOS 242* 176* 116 106  BILITOT 0.76 0.34 0.6 0.5  PROT 7.3 6.2* 5.6* 5.1*  ALBUMIN 3.1* 2.6* 2.8* 2.6*   No   results for input(s): LIPASE, AMYLASE in the last 168 hours. No results for input(s): AMMONIA in the last 168 hours. Coagulation profile No results for input(s): INR, PROTIME in the last 168 hours.  CBC:  Recent Labs Lab 07/12/16 1039 07/16/16 1034 07/17/16 0415  WBC 8.1 6.6 3.5*  NEUTROABS 5.9 4.6 2.1  HGB 10.5* 9.1* 8.3*  HCT 32.6* 27.2* 24.3*  MCV 86.8 82.9 82.7  PLT 191 206 198   Cardiac Enzymes: No results for input(s): CKTOTAL, CKMB, CKMBINDEX, TROPONINI in the last 168 hours. BNP: Invalid input(s): POCBNP CBG: No results for input(s): GLUCAP in the last 168 hours. D-Dimer No results for input(s): DDIMER in the last 72 hours. Hgb A1c No results for input(s): HGBA1C in the last 72 hours. Lipid Profile No results for input(s): CHOL, HDL, LDLCALC, TRIG, CHOLHDL, LDLDIRECT in the last 72 hours. Thyroid function studies No results for input(s): TSH, T4TOTAL, T3FREE, THYROIDAB in the last 72 hours.  Invalid input(s): FREET3 Anemia work up No results for input(s): VITAMINB12, FOLATE, FERRITIN, TIBC, IRON, RETICCTPCT in the last 72 hours. Microbiology Recent Results  (from the past 240 hour(s))  C difficile quick scan w PCR reflex     Status: None   Collection Time: 07/16/16  9:30 PM  Result Value Ref Range Status   C Diff antigen NEGATIVE NEGATIVE Final   C Diff toxin NEGATIVE NEGATIVE Final   C Diff interpretation No C. difficile detected.  Final      Studies:  No results found.  Assessment: 65 y.o. with multiple myeloma in remission, on maintenance Revlimid, was admitted for community-acquired pneumonia, nausea vomiting, failed outpt management.   #1. Community-acquired pneumonia, improving  #2 atypical chest pain, likely secondary to #1, improving  #3 nausea and vomiting, improving  #4 severe hypokalemia, improving #5 moderate protein and calorie malnutrition, #6 deconditioning #7 anemia of chronic illness, stable    Plan:  -she clinically is doing better, left chest pain much improved, will continue cefepime today, and possible change to oral antibiotics tomorrow -Her potassium was normal at 4.7 this morning, she still has mild diarrhea, oral potassium 20 minutes equal twice daily, and a repeat lab in the morning -she is eating much better, will stop IVF today   -Continue pain management and other symptom management -I encouraged her to ambulate -Dr. Alvy Bimler will resume care tomorrow  Truitt Merle, MD 07/18/2016  11:54 AM

## 2016-07-18 NOTE — Progress Notes (Signed)
Physical Therapy Treatment Patient Details Name: Jessica [name] MRN: [id number] DOB: 1950/09/09 Today's Date: 07/18/2016    History of Present Illness Pt admitted with weakness, PNA, and N/V and hx of multiple myeloma.     PT Comments    Assisted with amb in hallway using walker for increased stability and pt preference.     Follow Up Recommendations  No PT follow up     Equipment Recommendations  None recommended by PT (has a walker and a cane at home "but don't use them much")    Recommendations for Other Services       Precautions / Restrictions Precautions Precautions: Fall Precaution Comments: monitor sats Restrictions Weight Bearing Restrictions: No    Mobility  Bed Mobility               General bed mobility comments: Pt OOB in recliner   Transfers Overall transfer level: Needs assistance Equipment used: None Transfers: Sit to/from Stand Sit to Stand: Supervision;Min guard         General transfer comment: min guard to steady with initial standing  Ambulation/Gait Ambulation/Gait assistance: Min guard;Supervision Ambulation Distance (Feet): 375 Feet Assistive device: Rolling walker (2 wheeled) (pt preferred to use walker this session due to weakness ) Gait Pattern/deviations: Step-through pattern;Decreased step length - right;Decreased step length - left;Shuffle;Trunk flexed Gait velocity: decr   General Gait Details: tolerated well, mild instability due to "weakness" stated pt although feeling better.  Pt preferred to use walker in our large open hallways.     Stairs            Wheelchair Mobility    Modified Rankin (Stroke Patients Only)       Balance                                    Cognition Arousal/Alertness: Awake/alert Behavior During Therapy: WFL for tasks assessed/performed Overall Cognitive Status: Within Functional Limits for tasks assessed                      Exercises      General  Comments        Pertinent Vitals/Pain Pain Assessment: No/denies pain    Home Living                      Prior Function            PT Goals (current goals can now be found in the care plan section) Progress towards PT goals: Progressing toward goals    Frequency    Min 3X/week      PT Plan      Co-evaluation             End of Session Equipment Utilized During Treatment: Gait belt Activity Tolerance: Patient tolerated treatment well Patient left: in chair     Time: 2620-3559 PT Time Calculation (min) (ACUTE ONLY): 14 min  Charges:  $Gait Training: 8-22 mins                    G Codes:      Jessica [name]  PTA [facility]  Acute  Rehab Pager      [phone]

## 2016-07-19 ENCOUNTER — Ambulatory Visit: Payer: Medicare Other | Admitting: Nurse Practitioner

## 2016-07-19 LAB — CBC WITH DIFFERENTIAL/PLATELET
Basophils Absolute: 0 10*3/uL (ref 0.0–0.1)
Basophils Relative: 0 %
Eosinophils Absolute: 0.1 10*3/uL (ref 0.0–0.7)
Eosinophils Relative: 2 %
HCT: 26.2 % — ABNORMAL LOW (ref 36.0–46.0)
Hemoglobin: 8.8 g/dL — ABNORMAL LOW (ref 12.0–15.0)
Lymphocytes Relative: 28 %
Lymphs Abs: 1.3 10*3/uL (ref 0.7–4.0)
MCH: 27.8 pg (ref 26.0–34.0)
MCHC: 33.6 g/dL (ref 30.0–36.0)
MCV: 82.9 fL (ref 78.0–100.0)
Monocytes Absolute: 0.6 10*3/uL (ref 0.1–1.0)
Monocytes Relative: 14 %
Neutro Abs: 2.5 10*3/uL (ref 1.7–7.7)
Neutrophils Relative %: 56 %
Platelets: 218 10*3/uL (ref 150–400)
RBC: 3.16 MIL/uL — ABNORMAL LOW (ref 3.87–5.11)
RDW: 16.6 % — ABNORMAL HIGH (ref 11.5–15.5)
WBC: 4.5 10*3/uL (ref 4.0–10.5)

## 2016-07-19 LAB — COMPREHENSIVE METABOLIC PANEL
ALT: 22 U/L (ref 14–54)
AST: 17 U/L (ref 15–41)
Albumin: 3 g/dL — ABNORMAL LOW (ref 3.5–5.0)
Alkaline Phosphatase: 104 U/L (ref 38–126)
Anion gap: 8 (ref 5–15)
BUN: 15 mg/dL (ref 6–20)
CO2: 22 mmol/L (ref 22–32)
Calcium: 8.5 mg/dL — ABNORMAL LOW (ref 8.9–10.3)
Chloride: 108 mmol/L (ref 101–111)
Creatinine, Ser: 0.73 mg/dL (ref 0.44–1.00)
GFR calc Af Amer: >60 mL/min (ref >60)
GFR calc non Af Amer: >60 mL/min (ref >60)
Glucose, Bld: 81 mg/dL (ref 65–99)
Potassium: 3.7 mmol/L (ref 3.5–5.1)
Sodium: 138 mmol/L (ref 135–145)
Total Bilirubin: 0.5 mg/dL (ref 0.3–1.2)
Total Protein: 5.6 g/dL — ABNORMAL LOW (ref 6.5–8.1)

## 2016-07-19 NOTE — Discharge Summary (Signed)
Physician Discharge Summary  Patient ID: Jessica [name] MRN: [id number] DOB/AGE: [birth year] 65 y.o.  Admit date: 07/16/2016 Discharge date: 07/19/2016  Primary Care Physician:  Henrine Screws, MD   Discharge Diagnoses:    Present on Admission: . Multiple myeloma in remission ([place]) . Nausea and vomiting . Pancytopenia, acquired ([place]) . Pneumonia   Discharge Medications:    Medication List    STOP taking these medications   dexamethasone 4 MG tablet Commonly known as:  DECADRON   HYDROcodone-acetaminophen 7.5-325 mg/15 ml solution Commonly known as:  HYCET   lenalidomide 5 MG capsule Commonly known as:  REVLIMID   levofloxacin 500 MG tablet Commonly known as:  LEVAQUIN     TAKE these medications   acetaminophen 500 MG tablet Commonly known as:  TYLENOL Take 500-1,000 mg by mouth every 6 (six) hours as needed for mild pain, moderate pain, fever or headache.   acyclovir 800 MG tablet Commonly known as:  ZOVIRAX Take 1 tablet (800 mg total) by mouth 2 (two) times daily.   acyclovir ointment 5 % Commonly known as:  ZOVIRAX Apply 1 application topically every 3 (three) hours. What changed:  when to take this  reasons to take this   aspirin EC 81 MG tablet Take 81 mg by mouth daily with breakfast.   calcium carbonate 1500 (600 Ca) MG Tabs tablet Commonly known as:  OSCAL Take 1,500 mg by mouth daily with breakfast.   CENTRUM SILVER PO Take 1 tablet by mouth daily.   fluticasone 50 MCG/ACT nasal spray Commonly known as:  FLONASE Place 1 spray into both nostrils 2 (two) times daily. What changed:  when to take this  reasons to take this   HYDROmorphone 4 MG tablet Commonly known as:  DILAUDID Take 1 tablet (4 mg total) by mouth every 4 (four) hours as needed for severe pain.   LORazepam 0.5 MG tablet Commonly known as:  ATIVAN Take 1 tablet (0.5 mg total) by mouth every 8 (eight) hours as needed for anxiety (nausea).   magic  mouthwash Soln Take 10 mLs by mouth 4 (four) times daily as needed for mouth pain.   omeprazole 20 MG capsule Commonly known as:  PRILOSEC take 1 capsule by mouth once daily   ondansetron 8 MG tablet Commonly known as:  ZOFRAN Take 1 tablet (8 mg total) by mouth every 8 (eight) hours as needed for nausea or vomiting.   senna-docusate 8.6-50 MG tablet Commonly known as:  Senokot-S Take 1-2 tablets by mouth daily as needed for mild constipation or moderate constipation.   sodium chloride 0.65 % Soln nasal spray Commonly known as:  OCEAN Place 1 spray into both nostrils as needed for congestion.   Vitamin D3 2000 units capsule Take 2,000 Units by mouth daily.       Disposition and Follow-up:   Significant Diagnostic Studies:  Dg Chest 2 View  Result Date: 07/12/2016 CLINICAL DATA:  Chest pain history of myeloma. EXAM: CHEST  2 VIEW COMPARISON:  No recent prior. FINDINGS: Mediastinum hilar structures are normal. Heart size normal. Ill-defined infiltrates noted in the superior segment of the left lower lobe in the left lung base. These changes most likely secondary to pneumonia. Given the peripheral nature of these densities pulmonary embolus cannot be excluded. Small left pleural effusion cannot be excluded. Questionable mild deformity noted of the left posterior eighth and ninth ribs. These changes may be related to myeloma given the patient's prior history. Other etiologies of bony abnormality including  metastatic disease cannot be completely excluded. IMPRESSION: 1. Infiltrate in the superior segment left lower lobe and over the left lung base, most consistent with pneumonia. Given the peripheral nature of these infiltrates pulmonary embolus cannot be completely excluded. Close follow-up chest x-rays recommended to demonstrate clearing. Contrast-enhanced chest CT can be obtained for further evaluation as needed. 2. Questionable deformity noted of the left posterior eighth and ninth ribs.  Given patient's history of myeloma, myelomatous involvement of the ribs cannot be excluded. Electronically Signed   By: Marcello Moores  Register   On: 07/12/2016 13:08   Ct Chest W Contrast  Result Date: 07/13/2016 CLINICAL DATA:  Pleuritic chest pain and nausea and vomiting. Abnormal left lung opacity on chest radiograph. Multiple myeloma in remission. EXAM: CT CHEST WITH CONTRAST TECHNIQUE: Multidetector CT imaging of the chest was performed during intravenous contrast administration. CONTRAST:  5m ISOVUE-300 IOPAMIDOL (ISOVUE-300) INJECTION 61% COMPARISON:  Chest radiograph on 07/12/2016 FINDINGS: Cardiovascular:  No acute findings. Aortic atherosclerosis. Mediastinum/Nodes: No masses or pathologically enlarged lymph nodes identified. Lungs/Pleura: Airspace consolidation is seen predominately involving the left lower lobe with minimal involvement of the inferior posterior lingula. Central air bronchograms are demonstrated. This is most consistent with pneumonia. Right lung is clear. No pulmonary masses are identified. No evidence of pleural effusion. Upper Abdomen: Several tiny sub-cm hepatic cysts are noted. Normal adrenal glands. Musculoskeletal:  No suspicious bone lesions. IMPRESSION: Left lung airspace consolidation with predominant involvement of the left lower lobe, consistent with pneumonia. No evidence of centrally obstructing mass, lymphadenopathy, or pleural effusion. Electronically Signed   By: JEarle GellM.D.   On: 07/13/2016 15:36    Discharge Laboratory Values: Lab Results  Component Value Date   WBC 4.5 07/19/2016   HGB 8.8 (L) 07/19/2016   HCT 26.2 (L) 07/19/2016   MCV 82.9 07/19/2016   PLT 218 07/19/2016   Lab Results  Component Value Date   NA 138 07/19/2016   K 3.7 07/19/2016   CL 108 07/19/2016   CO2 22 07/19/2016    Brief H and P: For complete details please refer to admission H and P, but in brief, The patient was admitted to the hospital since 07/16/2016 due to  uncontrolled nausea, vomiting, severe hypokalemia and further management of pneumonia. By 07/19/2016, all symptoms have resolved.  Physical Exam at Discharge: BP (!) 149/87 (BP Location: Right Arm)   Pulse (!) 4   Temp 98.2 F (36.8 C) (Oral)   Resp 20   Ht 5' 0.25" (1.53 m)   Wt 116 lb 4.8 oz (52.8 kg)   LMP 08/10/2003   SpO2 99%   BMI 22.53 kg/m  GENERAL:alert, no distress and comfortable SKIN: skin color, texture, turgor are normal, no rashes or significant lesions EYES: normal, Conjunctiva are pink and non-injected, sclera clear OROPHARYNX:no exudate, no erythema and lips, buccal mucosa, and tongue normal  NECK: supple, thyroid normal size, non-tender, without nodularity LYMPH:  no palpable lymphadenopathy in the cervical, axillary or inguinal LUNGS: clear to auscultation and percussion with normal breathing effort HEART: regular rate & rhythm and no murmurs and no lower extremity edema ABDOMEN:abdomen soft, non-tender and normal bowel sounds Musculoskeletal:no cyanosis of digits and no clubbing  NEURO: alert & oriented x 3 with fluent speech, no focal motor/sensory deficits  Hospital Course:  Active Problems:   Multiple myeloma in remission ([facility])   Nausea and vomiting   [place]   Pneumonia   Hypokalemia   Diet:  Regular  Activity:  As tolerated  Condition at Discharge:   stable  Signed: Dr. [name] [phone]  07/19/2016, 8:31 AM

## 2016-07-19 NOTE — Progress Notes (Signed)
Nursing Discharge Summary  Patient ID: Jessica [name] MRN: [id number] DOB/AGE: [birth year] 66 y.o.  Admit date: 07/16/2016 Discharge date: 07/19/2016  Discharged Condition: good  Disposition: 01-Home or Self Care    Prescriptions Given: Patient had no new prescriptions.  Patient medications and follow up appointments discussed with patient and husband.  Neither verbalized understanding.   Means of Discharge: Patient to be taken downstairs to be discharged home via private vehicle.  Signed: Buel Ream 07/19/2016, 10:11 AM

## 2016-07-20 ENCOUNTER — Ambulatory Visit: Payer: Medicare Other | Admitting: Hematology and Oncology

## 2016-07-20 ENCOUNTER — Ambulatory Visit: Payer: Medicare Other | Admitting: Nurse Practitioner

## 2016-07-21 ENCOUNTER — Telehealth: Payer: Self-pay | Admitting: Hematology and Oncology

## 2016-07-21 NOTE — Telephone Encounter (Signed)
Appointment scheduled per in-basket message from 12/11. Patient aware of appointments.

## 2016-08-03 ENCOUNTER — Telehealth: Payer: Self-pay | Admitting: *Deleted

## 2016-08-03 DIAGNOSIS — E860 Dehydration: Secondary | ICD-10-CM

## 2016-08-03 DIAGNOSIS — C9001 Multiple myeloma in remission: Secondary | ICD-10-CM

## 2016-08-03 MED ORDER — ONDANSETRON HCL 8 MG PO TABS: 8.0000 mg | ORAL_TABLET | Freq: Three times a day (TID) | ORAL | 3 refills | Status: DC | PRN

## 2016-08-03 MED ORDER — ACYCLOVIR 800 MG PO TABS: 800.0000 mg | ORAL_TABLET | Freq: Two times a day (BID) | ORAL | 1 refills | Status: DC

## 2016-08-03 NOTE — Telephone Encounter (Signed)
Pt called for refills on Zofran and Acyclovir.  Refills sent to [facility].  Pt verbalized understanding and confirmed her appts for next week on 1/2.

## 2016-08-10 ENCOUNTER — Other Ambulatory Visit: Payer: Self-pay | Admitting: *Deleted

## 2016-08-10 ENCOUNTER — Encounter: Payer: Self-pay | Admitting: Hematology and Oncology

## 2016-08-10 ENCOUNTER — Ambulatory Visit (HOSPITAL_BASED_OUTPATIENT_CLINIC_OR_DEPARTMENT_OTHER): Payer: Medicare Other | Admitting: Hematology and Oncology

## 2016-08-10 ENCOUNTER — Telehealth: Payer: Self-pay | Admitting: Hematology and Oncology

## 2016-08-10 ENCOUNTER — Other Ambulatory Visit (HOSPITAL_BASED_OUTPATIENT_CLINIC_OR_DEPARTMENT_OTHER): Payer: Medicare Other

## 2016-08-10 VITALS — BP 149/79 | HR 73 | Temp 98.2°F | Resp 18 | Ht 60.25 in | Wt 120.2 lb

## 2016-08-10 DIAGNOSIS — D638 Anemia in other chronic diseases classified elsewhere: Secondary | ICD-10-CM | POA: Diagnosis not present

## 2016-08-10 DIAGNOSIS — C9001 Multiple myeloma in remission: Secondary | ICD-10-CM

## 2016-08-10 LAB — CBC WITH DIFFERENTIAL/PLATELET
BASO%: 0.9 % (ref 0.0–2.0)
Basophils Absolute: 0 10*3/uL (ref 0.0–0.1)
EOS%: 6.7 % (ref 0.0–7.0)
Eosinophils Absolute: 0.3 10*3/uL (ref 0.0–0.5)
HCT: 32.8 % — ABNORMAL LOW (ref 34.8–46.6)
HGB: 10.6 g/dL — ABNORMAL LOW (ref 11.6–15.9)
LYMPH%: 32.2 % (ref 14.0–49.7)
MCH: 28.2 pg (ref 25.1–34.0)
MCHC: 32.2 g/dL (ref 31.5–36.0)
MCV: 87.5 fL (ref 79.5–101.0)
MONO#: 0.5 10*3/uL (ref 0.1–0.9)
MONO%: 10 % (ref 0.0–14.0)
NEUT#: 2.6 10*3/uL (ref 1.5–6.5)
NEUT%: 50.2 % (ref 38.4–76.8)
Platelets: 254 10*3/uL (ref 145–400)
RBC: 3.74 10*6/uL (ref 3.70–5.45)
RDW: 17.8 % — ABNORMAL HIGH (ref 11.2–14.5)
WBC: 5.2 10*3/uL (ref 3.9–10.3)
lymph#: 1.7 10*3/uL (ref 0.9–3.3)

## 2016-08-10 LAB — COMPREHENSIVE METABOLIC PANEL
ALT: 14 U/L (ref 0–55)
AST: 19 U/L (ref 5–34)
Albumin: 3.8 g/dL (ref 3.5–5.0)
Alkaline Phosphatase: 111 U/L (ref 40–150)
Anion Gap: 10 mEq/L (ref 3–11)
BUN: 12.8 mg/dL (ref 7.0–26.0)
CO2: 22 mEq/L (ref 22–29)
Calcium: 9.4 mg/dL (ref 8.4–10.4)
Chloride: 110 mEq/L — ABNORMAL HIGH (ref 98–109)
Creatinine: 1 mg/dL (ref 0.6–1.1)
EGFR: 59 mL/min/{1.73_m2} — ABNORMAL LOW (ref >90)
Glucose: 89 mg/dl (ref 70–140)
Potassium: 4.4 mEq/L (ref 3.5–5.1)
Sodium: 143 mEq/L (ref 136–145)
Total Bilirubin: 0.3 mg/dL (ref 0.20–1.20)
Total Protein: 6.8 g/dL (ref 6.4–8.3)

## 2016-08-10 MED ORDER — LENALIDOMIDE 5 MG PO CAPS: 5.0000 mg | ORAL_CAPSULE | Freq: Every day | ORAL | 0 refills | Status: DC

## 2016-08-10 NOTE — Assessment & Plan Note (Signed)
She has almost completely recovered from recent illness. Her blood counts are improving. I recommend resumption of Revlimid. She has 8 days of prescription left. After she complete her recent prescription, she will take 7 days break and then we will resume next cycle, 21 days on, 7 days off. She will continue acyclovir for antimicrobial prophylaxis. She will take aspirin for DVT prophylaxis. She will continue calcium with vitamin D supplements. Her appointment to see a dentist is due this month. I have made an appointment for labs in February and see her back in March for further review along with Zometa every 3 months

## 2016-08-10 NOTE — Telephone Encounter (Signed)
Appointments scheduled per 1/2 LOS. Patient given AVS report and calendars with future scheduled appointments. °

## 2016-08-10 NOTE — Assessment & Plan Note (Signed)
This is likely anemia of chronic disease. The patient denies recent history of bleeding such as epistaxis, hematuria or hematochezia. She is asymptomatic from the anemia. We will observe for now.  She does not require transfusion now.   

## 2016-08-10 NOTE — Progress Notes (Signed)
[place] OFFICE PROGRESS NOTE  Patient Care Team: Josetta Huddle, MD as PCP - General (Internal Medicine) Ginette Pitman, MD as Consulting Physician (Hematology and Oncology) Hessie Dibble, MD as Consulting Physician (Hematology and Oncology)  SUMMARY OF ONCOLOGIC HISTORY: Oncology History    M-protein 0.69 gm/dl IFIX - IgG, Kappa IgG - 868 IgA - 19 IgM - < 20 Kappa - 21 Lambda - 5.7  09/06/2014 - Bone marrow aspirate and biopsy:   Normocellular marrow for age (40%) with a small monoclonal plasma cell population (1% on aspirate). Karyotype 65, XX  FISH Negative for myeloma associated changes  09/12/2014 - PET/CT  Two regions that are concerning for disease, one adjacent/involving the left ninth rib and one in the marrow of the right femur, in this patient with history of plasmacytoma.      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      09/06/2014 Bone Marrow Biopsy    BM performed at [facility] is not consistent with multiple myeloma, 1% plasma cell on aspirate      09/12/2014 Imaging     PET CT scan show involvement of left ninth rib and right femur      09/23/2014 - 10/23/2014 Radiation Therapy     she had radiation therapy to the cavernous sinus and skull base lesions, 45 Gy      10/21/2014 - 11/01/2014 Radiation Therapy     she had radiation to right femur , total 30 Gy      11/26/2014 - 02/14/2015 Chemotherapy     she is started on weekly dexamethasone, Velcade twice a week on day 1, 4, 8 and 11 and Revlimid days 1-14.      04/01/2015 Bone Marrow Transplant    She received  melphalan chemotherapy on 03/31/2015 followed by autologous stem cell transplant the day after      04/03/2015 - 04/18/2015 Hospital Admission    The patient was admitted to the hospital at [facility] for management related to complication from stem cell transplant. She had significant nausea requiring intravenous anti-emetics.      07/17/2015 -  Chemotherapy    She started maintenance Revlimid and monthly zometa       INTERVAL HISTORY: Please see below for problem oriented charting. She returns for further follow-up. Overall, she is almost back to normal. Her appetite is improving and she has gained some weight. The patient denies any recent signs or symptoms of bleeding such as spontaneous epistaxis, hematuria or hematochezia. She denies further diarrhea. No new bone pain or recent infection since last time I saw her  REVIEW OF SYSTEMS:   Constitutional: Denies fevers, chills or abnormal weight loss Eyes: Denies blurriness of vision Ears, nose, mouth, throat, and face: Denies mucositis or sore throat Respiratory: Denies cough, dyspnea or wheezes Cardiovascular: Denies palpitation, chest discomfort or lower extremity swelling Gastrointestinal:  Denies nausea, heartburn or change in bowel habits Skin: Denies abnormal skin rashes Lymphatics: Denies new lymphadenopathy or easy bruising Neurological:Denies numbness, tingling or new weaknesses Behavioral/Psych: Mood is stable, no new changes  All other systems were reviewed  with the patient and are negative.  I have reviewed the past medical history, past surgical history, social history and family history with the patient and they are unchanged from previous note.  ALLERGIES:  is allergic to augmentin [amoxicillin-pot clavulanate]; albuterol; codeine; ibuprofen; and nsaids.  MEDICATIONS:  Current Outpatient Prescriptions  Medication Sig Dispense Refill  . acetaminophen (TYLENOL) 500 MG tablet Take 500-1,000 mg by mouth every 6  (six) hours as needed for mild pain, moderate pain, fever or headache.    Marland Kitchen acyclovir (ZOVIRAX) 800 MG tablet Take 1 tablet (800 mg total) by mouth 2 (two) times daily. 60 tablet 1  . acyclovir ointment (ZOVIRAX) 5 % Apply 1 application topically every 3 (three) hours. (Patient taking differently: Apply 1 application topically every 3 (three) hours as needed (for sores). ) 15 g 1  . aspirin EC 81 MG tablet Take 81 mg by mouth daily with breakfast.    . calcium carbonate (OSCAL) 1500 (600 Ca) MG TABS tablet Take 1,500 mg by mouth daily with breakfast.    . Cholecalciferol (VITAMIN D3) 2000 units capsule Take 2,000 Units by mouth daily.     . fluticasone (FLONASE) 50 MCG/ACT nasal spray Place 1 spray into both nostrils 2 (two) times daily. (Patient taking differently: Place 1 spray into both nostrils 2 (two) times daily as needed for allergies or rhinitis. ) 16 g 2  . HYDROmorphone (DILAUDID) 4 MG tablet Take 1 tablet (4 mg total) by mouth every 4 (four) hours as needed for severe pain. 60 tablet 0  . LORazepam (ATIVAN) 0.5 MG tablet Take 1 tablet (0.5 mg total) by mouth every 8 (eight) hours as needed for anxiety (nausea). 60 tablet 0  . magic mouthwash SOLN Take 10 mLs by mouth 4 (four) times daily as needed for mouth pain. 480 mL 1  . Multiple Vitamins-Minerals (CENTRUM SILVER PO) Take 1 tablet by mouth daily.     Marland Kitchen omeprazole (PRILOSEC) 20 MG capsule take 1 capsule by mouth once daily 30 capsule 6  . ondansetron (ZOFRAN) 8 MG tablet Take 1 tablet (8 mg total) by mouth every 8 (eight) hours as needed for nausea or vomiting. 60 tablet 3  . senna-docusate (SENOKOT-S) 8.6-50 MG tablet Take 1-2 tablets by mouth daily as needed for mild constipation or moderate constipation.     . sodium chloride (OCEAN) 0.65 % SOLN nasal spray Place 1 spray into both nostrils as needed for congestion.     No current facility-administered medications for this visit.     PHYSICAL EXAMINATION: ECOG PERFORMANCE STATUS:  1 - Symptomatic but completely ambulatory  Vitals:   08/10/16 1235  BP: (!) 149/79  Pulse: 73  Resp: 18  Temp: 98.2 F (36.8 C)   Filed Weights   08/10/16 1235  Weight: 120 lb 3.2 oz (54.5 kg)    GENERAL:alert, no distress and comfortable SKIN: skin color, texture, turgor are normal, no rashes or significant lesions EYES: normal, Conjunctiva are pink and non-injected, sclera clear OROPHARYNX:no exudate, no erythema and lips, buccal mucosa, and tongue normal  NECK: supple, thyroid normal size, non-tender, without nodularity LYMPH:  no palpable lymphadenopathy in the cervical, axillary or inguinal LUNGS: clear to auscultation and percussion with normal breathing effort HEART: regular rate & rhythm and no murmurs and no lower extremity edema ABDOMEN:abdomen soft, non-tender and normal bowel sounds Musculoskeletal:no cyanosis of digits and no clubbing  NEURO: alert & oriented x 3 with fluent speech, no focal motor/sensory deficits  LABORATORY DATA:  I  have reviewed the data as listed    Component Value Date/Time   NA 143 08/10/2016 1221   K 4.4 08/10/2016 1221   CL 108 07/19/2016 0419   CO2 22 08/10/2016 1221   GLUCOSE 89 08/10/2016 1221   BUN 12.8 08/10/2016 1221   CREATININE 1.0 08/10/2016 1221   CALCIUM 9.4 08/10/2016 1221   PROT 6.8 08/10/2016 1221   ALBUMIN 3.8 08/10/2016 1221   AST 19 08/10/2016 1221   ALT 14 08/10/2016 1221   ALKPHOS 111 08/10/2016 1221   BILITOT 0.30 08/10/2016 1221   GFRNONAA >60 07/19/2016 0419   GFRAA >60 07/19/2016 0419    No results found for: SPEP, UPEP  Lab Results  Component Value Date   WBC 5.2 08/10/2016   NEUTROABS 2.6 08/10/2016   HGB 10.6 (L) 08/10/2016   HCT 32.8 (L) 08/10/2016   MCV 87.5 08/10/2016   PLT 254 08/10/2016      Chemistry      Component Value Date/Time   NA 143 08/10/2016 1221   K 4.4 08/10/2016 1221   CL 108 07/19/2016 0419   CO2 22 08/10/2016 1221   BUN 12.8 08/10/2016 1221   CREATININE 1.0  08/10/2016 1221      Component Value Date/Time   CALCIUM 9.4 08/10/2016 1221   ALKPHOS 111 08/10/2016 1221   AST 19 08/10/2016 1221   ALT 14 08/10/2016 1221   BILITOT 0.30 08/10/2016 1221       RADIOGRAPHIC STUDIES: I have personally reviewed the radiological images as listed and agreed with the findings in the report. Dg Chest 2 View  Result Date: 07/12/2016 CLINICAL DATA:  Chest pain history of myeloma. EXAM: CHEST  2 VIEW COMPARISON:  No recent prior. FINDINGS: Mediastinum hilar structures are normal. Heart size normal. Ill-defined infiltrates noted in the superior segment of the left lower lobe in the left lung base. These changes most likely secondary to pneumonia. Given the peripheral nature of these densities pulmonary embolus cannot be excluded. Small left pleural effusion cannot be excluded. Questionable mild deformity noted of the left posterior eighth and ninth ribs. These changes may be related to myeloma given the patient's prior history. Other etiologies of bony abnormality including metastatic disease cannot be completely excluded. IMPRESSION: 1. Infiltrate in the superior segment left lower lobe and over the left lung base, most consistent with pneumonia. Given the peripheral nature of these infiltrates pulmonary embolus cannot be completely excluded. Close follow-up chest x-rays recommended to demonstrate clearing. Contrast-enhanced chest CT can be obtained for further evaluation as needed. 2. Questionable deformity noted of the left posterior eighth and ninth ribs. Given patient's history of myeloma, myelomatous involvement of the ribs cannot be excluded. Electronically Signed   By: Marcello Moores  Register   On: 07/12/2016 13:08   Ct Chest W Contrast  Result Date: 07/13/2016 CLINICAL DATA:  Pleuritic chest pain and nausea and vomiting. Abnormal left lung opacity on chest radiograph. Multiple myeloma in remission. EXAM: CT CHEST WITH CONTRAST TECHNIQUE: Multidetector CT imaging of the  chest was performed during intravenous contrast administration. CONTRAST:  13m ISOVUE-300 IOPAMIDOL (ISOVUE-300) INJECTION 61% COMPARISON:  Chest radiograph on 07/12/2016 FINDINGS: Cardiovascular:  No acute findings. Aortic atherosclerosis. Mediastinum/Nodes: No masses or pathologically enlarged lymph nodes identified. Lungs/Pleura: Airspace consolidation is seen predominately involving the left lower lobe with minimal involvement of the inferior posterior lingula. Central air bronchograms are demonstrated. This is most consistent with pneumonia. Right lung is clear. No pulmonary masses are identified. No evidence of pleural effusion. Upper  Abdomen: Several tiny sub-cm hepatic cysts are noted. Normal adrenal glands. Musculoskeletal:  No suspicious bone lesions. IMPRESSION: Left lung airspace consolidation with predominant involvement of the left lower lobe, consistent with pneumonia. No evidence of centrally obstructing mass, lymphadenopathy, or pleural effusion. Electronically Signed   By: Earle Gell M.D.   On: 07/13/2016 15:36    ASSESSMENT & PLAN:  Multiple myeloma in remission [facility]) She has almost completely recovered from recent illness. Her blood counts are improving. I recommend resumption of Revlimid. She has 8 days of prescription left. After she complete her recent prescription, she will take 7 days break and then we will resume next cycle, 21 days on, 7 days off. She will continue acyclovir for antimicrobial prophylaxis. She will take aspirin for DVT prophylaxis. She will continue calcium with vitamin D supplements. Her appointment to see a dentist is due this month. I have made an appointment for labs in February and see her back in March for further review along with Zometa every 3 months  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 This is likely anemia of chronic disease. The patient denies recent history of bleeding such as epistaxis, hematuria or hematochezia. She is asymptomatic from the  anemia. We will observe for now.  She does not require transfusion now.     Orders Placed This Encounter  Procedures  . Kappa/lambda light chains    Standing Status:   Future    Standing Expiration Date:   09/14/2017  . Multiple Myeloma Panel (SPEP&IFE w/QIG)    Standing Status:   Future    Standing Expiration Date:   09/14/2017   All questions were answered. The patient knows to call the clinic with any problems, questions or concerns. No barriers to learning was detected. I spent 15 minutes counseling the patient face to face. The total time spent in the appointment was 20 minutes and more than 50% was on counseling and review of test results     [name], MD 08/10/2016 1:20 PM

## 2016-09-17 ENCOUNTER — Other Ambulatory Visit: Payer: Self-pay | Admitting: *Deleted

## 2016-09-17 MED ORDER — LENALIDOMIDE 5 MG PO CAPS: 5.0000 mg | ORAL_CAPSULE | Freq: Every day | ORAL | 0 refills | Status: DC

## 2016-09-21 ENCOUNTER — Other Ambulatory Visit: Payer: Self-pay | Admitting: Hematology and Oncology

## 2016-09-27 ENCOUNTER — Other Ambulatory Visit: Payer: Self-pay | Admitting: Hematology and Oncology

## 2016-09-27 DIAGNOSIS — C9001 Multiple myeloma in remission: Secondary | ICD-10-CM

## 2016-09-27 DIAGNOSIS — E860 Dehydration: Secondary | ICD-10-CM

## 2016-09-29 ENCOUNTER — Telehealth: Payer: Self-pay | Admitting: *Deleted

## 2016-09-29 NOTE — Telephone Encounter (Signed)
I have cancelled her appt for labs tomorrow Please notify her

## 2016-09-29 NOTE — Telephone Encounter (Signed)
Jessica [name] left message stating she had labs today at [facility] and results were good. Wondering if you can see them in Care Everywhere and if she can cancel her lab appt at [facility] scheduled for tomorrow.

## 2016-09-30 ENCOUNTER — Other Ambulatory Visit: Payer: Medicare Other

## 2016-10-07 ENCOUNTER — Telehealth: Payer: Self-pay | Admitting: Hematology and Oncology

## 2016-10-07 ENCOUNTER — Ambulatory Visit (HOSPITAL_BASED_OUTPATIENT_CLINIC_OR_DEPARTMENT_OTHER): Payer: Medicare Other | Admitting: Hematology and Oncology

## 2016-10-07 ENCOUNTER — Ambulatory Visit (HOSPITAL_BASED_OUTPATIENT_CLINIC_OR_DEPARTMENT_OTHER): Payer: Medicare Other

## 2016-10-07 ENCOUNTER — Other Ambulatory Visit: Payer: Self-pay | Admitting: *Deleted

## 2016-10-07 VITALS — BP 150/87 | HR 65 | Temp 98.2°F | Resp 18 | Ht 60.25 in | Wt 122.1 lb

## 2016-10-07 DIAGNOSIS — C9001 Multiple myeloma in remission: Secondary | ICD-10-CM

## 2016-10-07 DIAGNOSIS — Z9481 Bone marrow transplant status: Secondary | ICD-10-CM

## 2016-10-07 DIAGNOSIS — D638 Anemia in other chronic diseases classified elsewhere: Secondary | ICD-10-CM | POA: Diagnosis not present

## 2016-10-07 MED ORDER — ZOLEDRONIC ACID 4 MG/5ML IV CONC
3.0000 mg | Freq: Once | INTRAVENOUS | Status: AC
  Administered 2016-10-07: 3 mg via INTRAVENOUS
  Filled 2016-10-07: qty 3.75

## 2016-10-07 MED ORDER — SODIUM CHLORIDE 0.9 % IV SOLN
Freq: Once | INTRAVENOUS | Status: AC
  Administered 2016-10-07: 15:00:00 via INTRAVENOUS

## 2016-10-07 NOTE — Telephone Encounter (Signed)
Gave patient AVS and calendar per 10/07/2016 los.

## 2016-10-07 NOTE — Patient Instructions (Signed)

## 2016-10-08 ENCOUNTER — Encounter: Payer: Self-pay | Admitting: Hematology and Oncology

## 2016-10-08 NOTE — Progress Notes (Signed)
[place] OFFICE PROGRESS NOTE  Patient Care Team: Josetta Huddle, MD as PCP - General (Internal Medicine) Ginette Pitman, MD as Consulting Physician (Hematology and Oncology) Hessie Dibble, MD as Consulting Physician (Hematology and Oncology)  SUMMARY OF ONCOLOGIC HISTORY: Oncology History    M-protein 0.69 gm/dl IFIX - IgG, Kappa IgG - 868 IgA - 19 IgM - < 20 Kappa - 21 Lambda - 5.7  09/06/2014 - Bone marrow aspirate and biopsy:   Normocellular marrow for age (40%) with a small monoclonal plasma cell population (1% on aspirate). Karyotype 44, XX  FISH Negative for myeloma associated changes  09/12/2014 - PET/CT  Two regions that are concerning for disease, one adjacent/involving the left ninth rib and one in the marrow of the right femur, in this patient with history of plasmacytoma.      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      09/06/2014 Bone Marrow Biopsy    BM performed at [facility] is not consistent with multiple myeloma, 1% plasma cell on aspirate      09/12/2014 Imaging     PET CT scan show involvement of left ninth rib and right femur      09/23/2014 - 10/23/2014 Radiation Therapy     she had radiation therapy to the cavernous sinus and skull base lesions, 45 Gy      10/21/2014 - 11/01/2014 Radiation Therapy     she had radiation to right femur , total 30 Gy      11/26/2014 - 02/14/2015 Chemotherapy     she is started on weekly dexamethasone, Velcade twice a week on day 1, 4, 8 and 11 and Revlimid days 1-14.      04/01/2015 Bone Marrow Transplant    She received  melphalan chemotherapy on 03/31/2015 followed by autologous stem cell transplant the day after      04/03/2015 - 04/18/2015 Hospital Admission    The patient was admitted to the hospital at [facility] for management related to complication from stem cell transplant. She had significant nausea requiring intravenous anti-emetics.      07/17/2015 -  Chemotherapy    She started maintenance Revlimid and monthly zometa       INTERVAL HISTORY: Please see below for problem oriented charting. She returns for follow-up. She is doing well. Denies recent dental issue. No recent bone pain. Denies recent infection. The patient denies any recent signs or symptoms of bleeding such as spontaneous epistaxis, hematuria or hematochezia.   REVIEW OF SYSTEMS:   Constitutional: Denies fevers, chills or abnormal weight loss Eyes: Denies blurriness of vision Ears, nose, mouth, throat, and face: Denies mucositis or sore throat Respiratory: Denies cough, dyspnea or wheezes Cardiovascular: Denies palpitation, chest discomfort or lower extremity swelling Gastrointestinal:  Denies nausea, heartburn or change in bowel habits Skin: Denies abnormal skin rashes Lymphatics: Denies new lymphadenopathy or easy bruising Neurological:Denies numbness, tingling or new weaknesses Behavioral/Psych: Mood is stable, no new changes  All other systems were reviewed with the patient and are negative.  I have reviewed the past medical history, past surgical history, social history  and family history with the patient and they are unchanged from previous note.  ALLERGIES:  is allergic to augmentin [amoxicillin-pot clavulanate]; albuterol; codeine; ibuprofen; and nsaids.  MEDICATIONS:  Current Outpatient Prescriptions  Medication Sig Dispense Refill  . acetaminophen (TYLENOL) 500 MG tablet Take 500-1,000 mg by mouth every 6 (six) hours as needed for mild pain, moderate pain, fever or headache.    Marland Kitchen acyclovir (ZOVIRAX) 800 MG  tablet take 1 tablet by mouth twice a day 60 tablet 1  . aspirin EC 81 MG tablet Take 81 mg by mouth daily with breakfast.    . calcium carbonate (OSCAL) 1500 (600 Ca) MG TABS tablet Take 1,500 mg by mouth daily with breakfast.    . Cholecalciferol (VITAMIN D3) 2000 units capsule Take 2,000 Units by mouth daily.     Marland Kitchen lenalidomide (REVLIMID) 5 MG capsule Take 1 capsule (5 mg total) by mouth daily. 21 days on and 7 days off. 21 capsule 0  . LORazepam (ATIVAN) 0.5 MG tablet Take 1 tablet (0.5 mg total) by mouth every 8 (eight) hours as needed for anxiety (nausea). 60 tablet 0  . Multiple Vitamins-Minerals (CENTRUM SILVER PO) Take 1 tablet by mouth daily.     Marland Kitchen omeprazole (PRILOSEC) 20 MG capsule take 1 capsule by mouth once daily 30 capsule 6  . ondansetron (ZOFRAN) 8 MG tablet Take 1 tablet (8 mg total) by mouth every 8 (eight) hours as needed for nausea or vomiting. 60 tablet 3  . senna-docusate (SENOKOT-S) 8.6-50 MG tablet Take 1-2 tablets by mouth daily as needed for mild constipation or moderate constipation.     . sodium chloride (OCEAN) 0.65 % SOLN nasal spray Place 1 spray into both nostrils as needed for congestion.    Marland Kitchen acyclovir ointment (ZOVIRAX) 5 % Apply 1 application topically every 3 (three) hours. (Patient not taking: Reported on 10/07/2016) 15 g 1  . fluticasone (FLONASE) 50 MCG/ACT nasal spray Place 1 spray into both nostrils 2 (two) times daily. (Patient not taking: Reported on 10/07/2016) 16 g 2  . HYDROmorphone (DILAUDID) 4 MG tablet Take 1 tablet (4 mg total) by mouth every 4 (four) hours as needed for severe pain. (Patient not taking: Reported on 10/07/2016) 60 tablet 0  . magic mouthwash SOLN Take 10 mLs by mouth 4 (four) times daily as needed for mouth pain. (Patient not taking: Reported on 10/07/2016) 480 mL 1   No current facility-administered medications for this visit.     PHYSICAL EXAMINATION: ECOG PERFORMANCE STATUS: 0 - Asymptomatic  Vitals:   10/07/16 1340  BP: (!)  150/87  Pulse: 65  Resp: 18  Temp: 98.2 F (36.8 C)   Filed Weights   10/07/16 1340  Weight: 122 lb 1.6 oz (55.4 kg)    GENERAL:alert, no distress and comfortable SKIN: skin color, texture, turgor are normal, no rashes or significant lesions EYES: normal, Conjunctiva are pink and non-injected, sclera clear OROPHARYNX:no exudate, no erythema and lips, buccal mucosa, and tongue normal  NECK: supple, thyroid normal size, non-tender, without nodularity LYMPH:  no palpable lymphadenopathy in the cervical, axillary or inguinal LUNGS: clear to auscultation and percussion with normal breathing effort HEART: regular rate & rhythm and no murmurs and no lower extremity edema ABDOMEN:abdomen soft, non-tender and normal bowel sounds Musculoskeletal:no cyanosis of digits and no clubbing  NEURO: alert & oriented x 3 with fluent speech, no focal motor/sensory deficits  LABORATORY DATA:  I have reviewed the data as listed    Component Value Date/Time  NA 143 08/10/2016 1221   K 4.4 08/10/2016 1221   CL 108 07/19/2016 0419   CO2 22 08/10/2016 1221   GLUCOSE 89 08/10/2016 1221   BUN 12.8 08/10/2016 1221   CREATININE 1.0 08/10/2016 1221   CALCIUM 9.4 08/10/2016 1221   PROT 6.8 08/10/2016 1221   ALBUMIN 3.8 08/10/2016 1221   AST 19 08/10/2016 1221   ALT 14 08/10/2016 1221   ALKPHOS 111 08/10/2016 1221   BILITOT 0.30 08/10/2016 1221   GFRNONAA >60 07/19/2016 0419   GFRAA >60 07/19/2016 0419    No results found for: SPEP, UPEP  Lab Results  Component Value Date   WBC 5.2 08/10/2016   NEUTROABS 2.6 08/10/2016   HGB 10.6 (L) 08/10/2016   HCT 32.8 (L) 08/10/2016   MCV 87.5 08/10/2016   PLT 254 08/10/2016      Chemistry      Component Value Date/Time   NA 143 08/10/2016 1221   K 4.4 08/10/2016 1221   CL 108 07/19/2016 0419   CO2 22 08/10/2016 1221   BUN 12.8 08/10/2016 1221   CREATININE 1.0 08/10/2016 1221      Component Value Date/Time   CALCIUM 9.4 08/10/2016 1221    ALKPHOS 111 08/10/2016 1221   AST 19 08/10/2016 1221   ALT 14 08/10/2016 1221   BILITOT 0.30 08/10/2016 1221      ASSESSMENT & PLAN:  Multiple myeloma in remission [facility]) I review her labs from [facility]. Her recent myeloma panel shows she remained in remission She will continue on Revlimid, 21 days on, 7 days off. She will continue acyclovir for antimicrobial prophylaxis. She will take aspirin for DVT prophylaxis. She will continue calcium with vitamin D supplements.  She has an appointment pending to see her dentist  Per recommendation from [facility], we will continue Zometa at this month and another one in June 2018.   After that, she would have completed 2 years total treatment with IV Zometa.  After that, we will repeat bone density scan and to determine whether she would benefit from future long-term IV bisphosphonates.  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 This is likely anemia of chronic disease. The patient denies recent history of bleeding such as epistaxis, hematuria or hematochezia. She is asymptomatic from the anemia. We will observe for now.   S/P bone marrow transplant [facility]) The patient is doing well s/p bone marrow transplant. She has completed prophylactic treatment with acyclovir after transplant. Recently, when we attempted to stop her acyclovir, she had recurrent herpes simplex. We will continue acyclovir indefinitely as secondary prevention.   Orders Placed This Encounter  Procedures  . CBC with Differential/Platelet    Standing Status:   Future    Standing Expiration Date:   11/11/2017  . Comprehensive metabolic panel    Standing Status:   Future    Standing Expiration Date:   11/11/2017  . Kappa/lambda light chains    Standing Status:   Future    Standing Expiration Date:   11/11/2017  . Multiple Myeloma Panel (SPEP&IFE w/QIG)    Standing Status:   Future    Standing Expiration Date:   11/11/2017   All questions were  answered. The patient knows to call the clinic with any problems, questions or concerns. No barriers to learning was detected. I spent 15 minutes counseling the patient face to face. The total time spent in the appointment was 20 minutes and more than 50% was on counseling and  review of test results     [name], MD 10/08/2016 6:38 AM

## 2016-10-08 NOTE — Assessment & Plan Note (Signed)
The patient is doing well s/p bone marrow transplant. She has completed prophylactic treatment with acyclovir after transplant. Recently, when we attempted to stop her acyclovir, she had recurrent herpes simplex. We will continue acyclovir indefinitely as secondary prevention.

## 2016-10-08 NOTE — Assessment & Plan Note (Signed)
This is likely anemia of chronic disease. The patient denies recent history of bleeding such as epistaxis, hematuria or hematochezia. She is asymptomatic from the anemia. We will observe for now.  

## 2016-10-08 NOTE — Assessment & Plan Note (Addendum)
I review her labs from [facility]. Her recent myeloma panel shows she remained in remission She will continue on Revlimid, 21 days on, 7 days off. She will continue acyclovir for antimicrobial prophylaxis. She will take aspirin for DVT prophylaxis. She will continue calcium with vitamin D supplements.  She has an appointment pending to see her dentist  Per recommendation from [facility], we will continue Zometa at this month and another one in June 2018.   After that, she would have completed 2 years total treatment with IV Zometa.  After that, we will repeat bone density scan and to determine whether she would benefit from future long-term IV bisphosphonates.

## 2016-10-14 ENCOUNTER — Other Ambulatory Visit: Payer: Self-pay | Admitting: *Deleted

## 2016-10-14 MED ORDER — LENALIDOMIDE 5 MG PO CAPS: 5.0000 mg | ORAL_CAPSULE | Freq: Every day | ORAL | 0 refills | Status: DC

## 2016-11-11 ENCOUNTER — Other Ambulatory Visit: Payer: Self-pay | Admitting: *Deleted

## 2016-11-11 MED ORDER — LENALIDOMIDE 5 MG PO CAPS
5.0000 mg | ORAL_CAPSULE | Freq: Every day | ORAL | 0 refills | Status: DC
Start: 2016-11-11 — End: 2016-12-10

## 2016-12-10 ENCOUNTER — Other Ambulatory Visit: Payer: Self-pay | Admitting: *Deleted

## 2016-12-10 MED ORDER — LENALIDOMIDE 5 MG PO CAPS: 5.0000 mg | ORAL_CAPSULE | Freq: Every day | ORAL | 0 refills | Status: DC

## 2016-12-13 ENCOUNTER — Other Ambulatory Visit: Payer: Self-pay | Admitting: Hematology and Oncology

## 2016-12-13 DIAGNOSIS — C9001 Multiple myeloma in remission: Secondary | ICD-10-CM

## 2016-12-13 DIAGNOSIS — E860 Dehydration: Secondary | ICD-10-CM

## 2017-01-05 ENCOUNTER — Other Ambulatory Visit: Payer: Self-pay | Admitting: *Deleted

## 2017-01-05 MED ORDER — LENALIDOMIDE 5 MG PO CAPS: 5.0000 mg | ORAL_CAPSULE | Freq: Every day | ORAL | 0 refills | Status: DC

## 2017-01-13 ENCOUNTER — Ambulatory Visit (HOSPITAL_BASED_OUTPATIENT_CLINIC_OR_DEPARTMENT_OTHER): Payer: Medicare Other | Admitting: Hematology and Oncology

## 2017-01-13 ENCOUNTER — Other Ambulatory Visit (HOSPITAL_BASED_OUTPATIENT_CLINIC_OR_DEPARTMENT_OTHER): Payer: Medicare Other

## 2017-01-13 ENCOUNTER — Encounter: Payer: Self-pay | Admitting: Hematology and Oncology

## 2017-01-13 ENCOUNTER — Ambulatory Visit (HOSPITAL_BASED_OUTPATIENT_CLINIC_OR_DEPARTMENT_OTHER): Payer: Medicare Other

## 2017-01-13 ENCOUNTER — Telehealth: Payer: Self-pay | Admitting: Hematology and Oncology

## 2017-01-13 DIAGNOSIS — N183 Chronic kidney disease, stage 3 unspecified: Secondary | ICD-10-CM

## 2017-01-13 DIAGNOSIS — D638 Anemia in other chronic diseases classified elsewhere: Secondary | ICD-10-CM | POA: Diagnosis not present

## 2017-01-13 DIAGNOSIS — E860 Dehydration: Secondary | ICD-10-CM

## 2017-01-13 DIAGNOSIS — C9001 Multiple myeloma in remission: Secondary | ICD-10-CM | POA: Diagnosis not present

## 2017-01-13 DIAGNOSIS — K219 Gastro-esophageal reflux disease without esophagitis: Secondary | ICD-10-CM | POA: Diagnosis not present

## 2017-01-13 DIAGNOSIS — Z9481 Bone marrow transplant status: Secondary | ICD-10-CM

## 2017-01-13 LAB — CBC WITH DIFFERENTIAL/PLATELET
BASO%: 2.9 % — ABNORMAL HIGH (ref 0.0–2.0)
Basophils Absolute: 0.1 10*3/uL (ref 0.0–0.1)
EOS%: 2.3 % (ref 0.0–7.0)
Eosinophils Absolute: 0.1 10*3/uL (ref 0.0–0.5)
HCT: 32.2 % — ABNORMAL LOW (ref 34.8–46.6)
HGB: 10.5 g/dL — ABNORMAL LOW (ref 11.6–15.9)
LYMPH%: 35.7 % (ref 14.0–49.7)
MCH: 27.4 pg (ref 25.1–34.0)
MCHC: 32.7 g/dL (ref 31.5–36.0)
MCV: 83.7 fL (ref 79.5–101.0)
MONO#: 0.5 10*3/uL (ref 0.1–0.9)
MONO%: 10.5 % (ref 0.0–14.0)
NEUT#: 2.4 10*3/uL (ref 1.5–6.5)
NEUT%: 48.6 % (ref 38.4–76.8)
Platelets: 282 10*3/uL (ref 145–400)
RBC: 3.84 10*6/uL (ref 3.70–5.45)
RDW: 17.6 % — ABNORMAL HIGH (ref 11.2–14.5)
WBC: 5 10*3/uL (ref 3.9–10.3)
lymph#: 1.8 10*3/uL (ref 0.9–3.3)

## 2017-01-13 LAB — COMPREHENSIVE METABOLIC PANEL
ALT: 18 U/L (ref 0–55)
AST: 19 U/L (ref 5–34)
Albumin: 3.8 g/dL (ref 3.5–5.0)
Alkaline Phosphatase: 92 U/L (ref 40–150)
Anion Gap: 7 mEq/L (ref 3–11)
BUN: 17.5 mg/dL (ref 7.0–26.0)
CO2: 23 mEq/L (ref 22–29)
Calcium: 8.9 mg/dL (ref 8.4–10.4)
Chloride: 111 mEq/L — ABNORMAL HIGH (ref 98–109)
Creatinine: 0.9 mg/dL (ref 0.6–1.1)
EGFR: 71 mL/min/{1.73_m2} — ABNORMAL LOW (ref >90)
Glucose: 80 mg/dl (ref 70–140)
Potassium: 4.3 mEq/L (ref 3.5–5.1)
Sodium: 141 mEq/L (ref 136–145)
Total Bilirubin: 0.37 mg/dL (ref 0.20–1.20)
Total Protein: 6.3 g/dL — ABNORMAL LOW (ref 6.4–8.3)

## 2017-01-13 MED ORDER — ACYCLOVIR 400 MG PO TABS: 400.0000 mg | ORAL_TABLET | Freq: Two times a day (BID) | ORAL | 9 refills | Status: DC

## 2017-01-13 MED ORDER — SODIUM CHLORIDE 0.9 % IV SOLN
3.0000 mg | Freq: Once | INTRAVENOUS | Status: AC
  Administered 2017-01-13: 3 mg via INTRAVENOUS
  Filled 2017-01-13: qty 3.75

## 2017-01-13 MED ORDER — SODIUM CHLORIDE 0.9 % IV SOLN
Freq: Once | INTRAVENOUS | Status: AC
  Administered 2017-01-13: 15:00:00 via INTRAVENOUS

## 2017-01-13 MED ORDER — OMEPRAZOLE 20 MG PO CPDR
20.0000 mg | DELAYED_RELEASE_CAPSULE | Freq: Every day | ORAL | 6 refills | Status: DC
Start: 2017-01-13 — End: 2017-10-27

## 2017-01-13 NOTE — Assessment & Plan Note (Signed)
She has chronic GERD I recommend Prilosec in the morning and Zantac at nighttime I also recommend she chew Tums on a regular basis

## 2017-01-13 NOTE — Progress Notes (Signed)
[facility] OFFICE PROGRESS NOTE  Patient Care Team: Josetta Huddle, MD as PCP - General (Internal Medicine) Ginette Pitman, MD as Consulting Physician (Hematology and Oncology) Hessie Dibble, MD as Consulting Physician (Hematology and Oncology)  SUMMARY OF ONCOLOGIC HISTORY: Oncology History    M-protein 0.69 gm/dl IFIX - IgG, Kappa IgG - 868 IgA - 19 IgM - < 20 Kappa - 21 Lambda - 5.7  09/06/2014 - Bone marrow aspirate and biopsy:   Normocellular marrow for age (40%) with a small monoclonal plasma cell population (1% on aspirate). Karyotype 73, XX  FISH Negative for myeloma associated changes  09/12/2014 - PET/CT  Two regions that are concerning for disease, one adjacent/involving the left ninth rib and one in the marrow of the right femur, in this patient with history of plasmacytoma.      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      09/06/2014 Bone Marrow Biopsy    BM performed at [facility] is not consistent with multiple myeloma, 1% plasma cell on aspirate      09/12/2014 Imaging     PET CT scan show involvement of left ninth rib and right femur      09/23/2014 - 10/23/2014 Radiation Therapy     she had radiation therapy to the cavernous sinus and skull base lesions, 45 Gy      10/21/2014 - 11/01/2014 Radiation Therapy     she had radiation to right femur , total 30 Gy      11/26/2014 - 02/14/2015 Chemotherapy     she is started on weekly dexamethasone, Velcade twice a week on day 1, 4, 8 and 11 and Revlimid days 1-14.      04/01/2015 Bone Marrow Transplant    She received  melphalan chemotherapy on 03/31/2015 followed by autologous stem cell transplant the day after      04/03/2015 - 04/18/2015 Hospital Admission    The patient was admitted to the hospital at [facility] for management related to complication from stem cell transplant. She had significant nausea requiring intravenous anti-emetics.      07/17/2015 -  Chemotherapy    She started maintenance Revlimid and monthly zometa       INTERVAL HISTORY: Please see below for problem oriented charting. Since our last time I saw her, she is doing well. She has new prescription glasses but  still have occasional visual disturbance over the right eye She denies bone pain Denies recent infection She complain of frequent heartburn sensation.  REVIEW OF SYSTEMS:   Constitutional: Denies fevers, chills or abnormal weight loss Ears, nose, mouth, throat, and face: Denies mucositis or sore throat Respiratory: Denies cough, dyspnea or wheezes Cardiovascular: Denies palpitation, chest discomfort or lower extremity swelling Skin: Denies abnormal skin rashes Lymphatics: Denies new lymphadenopathy or easy bruising Neurological:Denies numbness, tingling or new weaknesses Behavioral/Psych: Mood is stable, no new changes  All other systems were reviewed with the patient and are negative.  I have reviewed the past medical history, past surgical history, social history and family history with the patient and they are unchanged from previous  note.  ALLERGIES:  is allergic to augmentin [amoxicillin-pot clavulanate]; albuterol; codeine; ibuprofen; and nsaids.  MEDICATIONS:  Current Outpatient Prescriptions  Medication Sig Dispense Refill  . acetaminophen (TYLENOL) 500 MG tablet Take 500-1,000 mg by mouth every 6 (six) hours as needed for mild pain, moderate pain, fever or headache.    Marland Kitchen acyclovir (ZOVIRAX) 400 MG tablet Take 1 tablet (400 mg total) by mouth 2 (two) times daily. 60 tablet 9  . aspirin EC 81 MG tablet  Take 81 mg by mouth daily with breakfast.    . calcium carbonate (OSCAL) 1500 (600 Ca) MG TABS tablet Take 1,500 mg by mouth daily with breakfast.    . Cholecalciferol (VITAMIN D3) 2000 units capsule Take 2,000 Units by mouth daily.     Marland Kitchen lenalidomide (REVLIMID) 5 MG capsule Take 1 capsule (5 mg total) by mouth daily. 21 days on and 7 days off. 21 capsule 0  . Multiple Vitamins-Minerals (CENTRUM SILVER PO) Take 1 tablet by mouth daily.     Marland Kitchen omeprazole (PRILOSEC) 20 MG capsule Take 1 capsule (20 mg total) by mouth daily. 30 capsule 6  . ondansetron (ZOFRAN) 8 MG tablet Take 1 tablet (8 mg total) by mouth every 8 (eight) hours as needed for nausea or vomiting. 60 tablet 3  . senna-docusate (SENOKOT-S) 8.6-50 MG tablet Take 1-2 tablets by mouth daily as needed for mild constipation or moderate constipation.     . sodium chloride (OCEAN) 0.65 % SOLN nasal spray Place 1 spray into both nostrils as needed for congestion.     No current facility-administered medications for this visit.     PHYSICAL EXAMINATION: ECOG PERFORMANCE STATUS: 1 - Symptomatic but completely ambulatory  Vitals:   01/13/17 1350  BP: (!) 145/81  Pulse: 66  Resp: 18  Temp: 98.2 F (36.8 C)   Filed Weights   01/13/17 1350  Weight: 122 lb (55.3 kg)    GENERAL:alert, no distress and comfortable SKIN: skin color, texture, turgor are normal, no rashes or significant lesions EYES: normal, Conjunctiva are pink and non-injected, sclera clear OROPHARYNX:no exudate, no erythema and lips, buccal mucosa, and tongue normal  NECK: supple, thyroid normal size, non-tender, without nodularity LYMPH:  no palpable lymphadenopathy in the cervical, axillary or inguinal LUNGS: clear to auscultation and percussion with normal breathing effort HEART: regular rate & rhythm and no murmurs and no lower extremity edema ABDOMEN:abdomen soft, non-tender and normal bowel sounds Musculoskeletal:no cyanosis of digits and no clubbing  NEURO:  alert & oriented x 3 with fluent speech, no focal motor/sensory deficits  LABORATORY DATA:  I have reviewed the data as listed    Component Value Date/Time   NA 141 01/13/2017 1317   K 4.3 01/13/2017 1317   CL 108 07/19/2016 0419   CO2 23 01/13/2017 1317   GLUCOSE 80 01/13/2017 1317   BUN 17.5 01/13/2017 1317   CREATININE 0.9 01/13/2017 1317   CALCIUM 8.9 01/13/2017 1317   PROT 6.3 (L) 01/13/2017 1317   ALBUMIN 3.8 01/13/2017 1317   AST 19 01/13/2017 1317   ALT 18 01/13/2017 1317   ALKPHOS 92 01/13/2017 1317   BILITOT 0.37 01/13/2017 1317   GFRNONAA >60 07/19/2016 0419   GFRAA >60 07/19/2016 0419    No results found for: SPEP, UPEP  Lab Results  Component Value Date   WBC 5.0 01/13/2017   NEUTROABS 2.4 01/13/2017   HGB 10.5 (L) 01/13/2017   HCT 32.2 (L) 01/13/2017   MCV 83.7 01/13/2017   PLT 282  01/13/2017      Chemistry      Component Value Date/Time   NA 141 01/13/2017 1317   K 4.3 01/13/2017 1317   CL 108 07/19/2016 0419   CO2 23 01/13/2017 1317   BUN 17.5 01/13/2017 1317   CREATININE 0.9 01/13/2017 1317      Component Value Date/Time   CALCIUM 8.9 01/13/2017 1317   ALKPHOS 92 01/13/2017 1317   AST 19 01/13/2017 1317   ALT 18 01/13/2017 1317   BILITOT 0.37 01/13/2017 1317      ASSESSMENT & PLAN:  Multiple myeloma in remission ([place]) Her recent myeloma panel shows she remained in remission She will continue on Revlimid, 21 days on, 7 days off. She will continue acyclovir for antimicrobial prophylaxis. She will take aspirin for DVT prophylaxis. She will continue calcium with vitamin D supplements.   We will continue Zometa every 3 months from now  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 This is likely anemia of chronic disease. The patient denies recent history of bleeding such as epistaxis, hematuria or hematochezia. She is asymptomatic from the anemia. We will observe for now.   Chronic GERD She has chronic GERD I recommend Prilosec in the morning and  Zantac at nighttime I also recommend she chew Tums on a regular basis  Chronic kidney disease, stage III (moderate) Her kidney function is stable.  We will continue reduce dose Zometa   Orders Placed This Encounter  Procedures  . CBC with Differential/Platelet    Standing Status:   Future    Standing Expiration Date:   02/17/2018  . Comprehensive metabolic panel    Standing Status:   Future    Standing Expiration Date:   02/17/2018  . Kappa/lambda light chains    Standing Status:   Future    Standing Expiration Date:   02/17/2018  . Multiple Myeloma Panel (SPEP&IFE w/QIG)    Standing Status:   Future    Standing Expiration Date:   02/17/2018   All questions were answered. The patient knows to call the clinic with any problems, questions or concerns. No barriers to learning was detected. I spent 15 minutes counseling the patient face to face. The total time spent in the appointment was 20 minutes and more than 50% was on counseling and review of test results     [name], MD 01/13/2017 4:14 PM

## 2017-01-13 NOTE — Assessment & Plan Note (Signed)
This is likely anemia of chronic disease. The patient denies recent history of bleeding such as epistaxis, hematuria or hematochezia. She is asymptomatic from the anemia. We will observe for now.  

## 2017-01-13 NOTE — Assessment & Plan Note (Signed)
Her recent myeloma panel shows she remained in remission She will continue on Revlimid, 21 days on, 7 days off. She will continue acyclovir for antimicrobial prophylaxis. She will take aspirin for DVT prophylaxis. She will continue calcium with vitamin D supplements.   We will continue Zometa every 3 months from now 

## 2017-01-13 NOTE — Assessment & Plan Note (Signed)
Her kidney function is stable.  We will continue reduce dose Zometa 

## 2017-01-13 NOTE — Telephone Encounter (Signed)
Gave patient avs report and appointments for September.  °

## 2017-01-13 NOTE — Addendum Note (Signed)
Addended byMeribeth Mattes on: 01/13/2017 03:10 PM   Modules accepted: Orders

## 2017-01-13 NOTE — Patient Instructions (Addendum)

## 2017-01-14 LAB — KAPPA/LAMBDA LIGHT CHAINS
Ig Kappa Free Light Chain: 3.8 mg/L (ref 3.3–19.4)
Ig Lambda Free Light Chain: 7 mg/L (ref 5.7–26.3)
Kappa/Lambda FluidC Ratio: 0.54 (ref 0.26–1.65)

## 2017-01-17 ENCOUNTER — Telehealth: Payer: Self-pay

## 2017-01-17 LAB — MULTIPLE MYELOMA PANEL, SERUM
Albumin SerPl Elph-Mcnc: 3.6 g/dL (ref 2.9–4.4)
Albumin/Glob SerPl: 1.6 (ref 0.7–1.7)
Alpha 1: 0.3 g/dL (ref 0.0–0.4)
Alpha2 Glob SerPl Elph-Mcnc: 0.8 g/dL (ref 0.4–1.0)
B-Globulin SerPl Elph-Mcnc: 1 g/dL (ref 0.7–1.3)
Gamma Glob SerPl Elph-Mcnc: 0.3 g/dL — ABNORMAL LOW (ref 0.4–1.8)
Globulin, Total: 2.4 g/dL (ref 2.2–3.9)
IgA, Qn, Serum: 23 mg/dL — ABNORMAL LOW (ref 87–352)
IgG, Qn, Serum: 333 mg/dL — ABNORMAL LOW (ref 700–1600)
IgM, Qn, Serum: 7 mg/dL — ABNORMAL LOW (ref 26–217)
Total Protein: 6 g/dL (ref 6.0–8.5)

## 2017-01-17 NOTE — Telephone Encounter (Signed)
-----   Message from [name], MD sent at 01/17/2017  2:42 PM EDT ----- Regarding: myeloma panel OK pls let her know labs are good, she is in remission ----- Message ----- From: Interface, Lab In Three Zero One Sent: 01/13/2017   1:42 PM To: [name], MD

## 2017-01-17 NOTE — Telephone Encounter (Signed)
Called with below message. 

## 2017-02-03 ENCOUNTER — Other Ambulatory Visit: Payer: Self-pay

## 2017-02-03 MED ORDER — LENALIDOMIDE 5 MG PO CAPS
5.0000 mg | ORAL_CAPSULE | Freq: Every day | ORAL | 0 refills | Status: DC
Start: 2017-02-03 — End: 2017-03-03

## 2017-03-03 ENCOUNTER — Other Ambulatory Visit: Payer: Self-pay | Admitting: *Deleted

## 2017-03-03 MED ORDER — LENALIDOMIDE 5 MG PO CAPS: 5.0000 mg | ORAL_CAPSULE | Freq: Every day | ORAL | 0 refills | Status: DC

## 2017-03-11 ENCOUNTER — Other Ambulatory Visit: Payer: Self-pay | Admitting: Hematology and Oncology

## 2017-03-30 ENCOUNTER — Other Ambulatory Visit: Payer: Self-pay | Admitting: *Deleted

## 2017-03-30 MED ORDER — LENALIDOMIDE 5 MG PO CAPS: 5.0000 mg | ORAL_CAPSULE | Freq: Every day | ORAL | 0 refills | Status: DC

## 2017-04-14 ENCOUNTER — Ambulatory Visit (HOSPITAL_BASED_OUTPATIENT_CLINIC_OR_DEPARTMENT_OTHER): Payer: Medicare Other | Admitting: Hematology and Oncology

## 2017-04-14 ENCOUNTER — Ambulatory Visit (HOSPITAL_BASED_OUTPATIENT_CLINIC_OR_DEPARTMENT_OTHER): Payer: Medicare Other

## 2017-04-14 ENCOUNTER — Other Ambulatory Visit (HOSPITAL_BASED_OUTPATIENT_CLINIC_OR_DEPARTMENT_OTHER): Payer: Medicare Other

## 2017-04-14 ENCOUNTER — Telehealth: Payer: Self-pay | Admitting: Hematology and Oncology

## 2017-04-14 DIAGNOSIS — C9001 Multiple myeloma in remission: Secondary | ICD-10-CM

## 2017-04-14 DIAGNOSIS — D638 Anemia in other chronic diseases classified elsewhere: Secondary | ICD-10-CM | POA: Diagnosis not present

## 2017-04-14 DIAGNOSIS — Z9481 Bone marrow transplant status: Secondary | ICD-10-CM

## 2017-04-14 LAB — COMPREHENSIVE METABOLIC PANEL
ALT: 24 U/L (ref 0–55)
AST: 20 U/L (ref 5–34)
Albumin: 3.5 g/dL (ref 3.5–5.0)
Alkaline Phosphatase: 88 U/L (ref 40–150)
Anion Gap: 9 mEq/L (ref 3–11)
BUN: 9.4 mg/dL (ref 7.0–26.0)
CO2: 22 mEq/L (ref 22–29)
Calcium: 8.8 mg/dL (ref 8.4–10.4)
Chloride: 113 mEq/L — ABNORMAL HIGH (ref 98–109)
Creatinine: 0.8 mg/dL (ref 0.6–1.1)
EGFR: 72 mL/min/{1.73_m2} — ABNORMAL LOW (ref >90)
Glucose: 106 mg/dl (ref 70–140)
Potassium: 3.8 mEq/L (ref 3.5–5.1)
Sodium: 143 mEq/L (ref 136–145)
Total Bilirubin: 0.26 mg/dL (ref 0.20–1.20)
Total Protein: 6.3 g/dL — ABNORMAL LOW (ref 6.4–8.3)

## 2017-04-14 LAB — CBC WITH DIFFERENTIAL/PLATELET
BASO%: 0.8 % (ref 0.0–2.0)
Basophils Absolute: 0 10*3/uL (ref 0.0–0.1)
EOS%: 5 % (ref 0.0–7.0)
Eosinophils Absolute: 0.2 10*3/uL (ref 0.0–0.5)
HCT: 32 % — ABNORMAL LOW (ref 34.8–46.6)
HGB: 10.2 g/dL — ABNORMAL LOW (ref 11.6–15.9)
LYMPH%: 32 % (ref 14.0–49.7)
MCH: 26.9 pg (ref 25.1–34.0)
MCHC: 31.9 g/dL (ref 31.5–36.0)
MCV: 84.4 fL (ref 79.5–101.0)
MONO#: 0.2 10*3/uL (ref 0.1–0.9)
MONO%: 3.7 % (ref 0.0–14.0)
NEUT#: 2.8 10*3/uL (ref 1.5–6.5)
NEUT%: 58.5 % (ref 38.4–76.8)
Platelets: 218 10*3/uL (ref 145–400)
RBC: 3.79 10*6/uL (ref 3.70–5.45)
RDW: 16.4 % — ABNORMAL HIGH (ref 11.2–14.5)
WBC: 4.8 10*3/uL (ref 3.9–10.3)
lymph#: 1.5 10*3/uL (ref 0.9–3.3)

## 2017-04-14 MED ORDER — ZOLEDRONIC ACID 4 MG/5ML IV CONC
3.0000 mg | Freq: Once | INTRAVENOUS | Status: AC
  Administered 2017-04-14: 3 mg via INTRAVENOUS
  Filled 2017-04-14: qty 3.75

## 2017-04-14 NOTE — Patient Instructions (Signed)

## 2017-04-14 NOTE — Telephone Encounter (Signed)
Gave patient AVS and calendar for upcoming December appointments.

## 2017-04-15 ENCOUNTER — Encounter: Payer: Self-pay | Admitting: Hematology and Oncology

## 2017-04-15 LAB — KAPPA/LAMBDA LIGHT CHAINS
Ig Kappa Free Light Chain: 5.3 mg/L (ref 3.3–19.4)
Ig Lambda Free Light Chain: 8.9 mg/L (ref 5.7–26.3)
Kappa/Lambda FluidC Ratio: 0.6 (ref 0.26–1.65)

## 2017-04-15 NOTE — Progress Notes (Signed)
[facility] OFFICE PROGRESS NOTE  Patient Care Team: Gates, Robert, MD as PCP - General (Internal Medicine) Hurd, David D, MD as Consulting Physician (Hematology and Oncology) Vaidya, Rakhee Rajan, MD as Consulting Physician (Hematology and Oncology)  SUMMARY OF ONCOLOGIC HISTORY: Oncology History    M-protein 0.69 gm/dl IFIX - IgG, Kappa IgG - 868 IgA - 19 IgM - < 20 Kappa - 21 Lambda - 5.7  09/06/2014 - Bone marrow aspirate and biopsy:   Normocellular marrow for age (40%) with a small monoclonal plasma cell population (1% on aspirate). Karyotype 46, XX  FISH Negative for myeloma associated changes  09/12/2014 - PET/CT  Two regions that are concerning for disease, one adjacent/involving the left ninth rib and one in the marrow of the right femur, in this patient with history of plasmacytoma.      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      09/06/2014 Bone Marrow Biopsy    BM performed at [facility] is not consistent with multiple myeloma, 1% plasma cell on aspirate      09/12/2014 Imaging     PET CT scan show involvement of left ninth rib and right femur      09/23/2014 - 10/23/2014 Radiation Therapy     she had radiation therapy to the cavernous sinus and skull base lesions, 45 Gy      10/21/2014 - 11/01/2014 Radiation Therapy     she had radiation to right femur , total 30 Gy      11/26/2014 - 02/14/2015 Chemotherapy     she is started on weekly dexamethasone, Velcade twice a week on day 1, 4, 8 and 11 and Revlimid days 1-14.      04/01/2015 Bone Marrow Transplant    She received  melphalan chemotherapy on 03/31/2015 followed by autologous stem cell transplant the day after      04/03/2015 - 04/18/2015 Hospital Admission    The patient was admitted to the hospital at [facility] for management related to complication from stem cell transplant. She had significant nausea requiring intravenous anti-emetics.      07/17/2015 -  Chemotherapy    She started maintenance Revlimid and monthly zometa       INTERVAL HISTORY: Please see below for problem oriented charting. She returns for further follow-up with her husband She feels well She had recent dental visit without concerns for dental issues No new bone pain Denies recent infection  REVIEW OF SYSTEMS:   Constitutional: Denies fevers, chills or abnormal weight loss Eyes: Denies blurriness of vision Ears, nose, mouth, throat, and face: Denies mucositis or sore throat Respiratory: Denies cough, dyspnea or wheezes Cardiovascular: Denies palpitation, chest discomfort or lower extremity swelling Gastrointestinal:  Denies nausea, heartburn or change in bowel habits Skin: Denies abnormal skin rashes Lymphatics: Denies new lymphadenopathy or easy bruising Neurological:Denies numbness, tingling or new weaknesses Behavioral/Psych: Mood is stable, no new changes  All other systems were reviewed with the patient and are negative.  I have reviewed the past medical history, past surgical history, social history and family history with the patient and they are   unchanged from previous note.  ALLERGIES:  is allergic to augmentin [amoxicillin-pot clavulanate]; albuterol; codeine; ibuprofen; and nsaids.  MEDICATIONS:  Current Outpatient Prescriptions  Medication Sig Dispense Refill  . acetaminophen (TYLENOL) 500 MG tablet Take 500-1,000 mg by mouth every 6 (six) hours as needed for mild pain, moderate pain, fever or headache.    . acyclovir (ZOVIRAX) 400 MG tablet Take 1 tablet (400 mg total) by mouth 2 (two) times daily. 60  tablet 9  . aspirin EC 81 MG tablet Take 81 mg by mouth daily with breakfast.    . calcium carbonate (OSCAL) 1500 (600 Ca) MG TABS tablet Take 1,500 mg by mouth daily with breakfast.    . Cholecalciferol (VITAMIN D3) 2000 units capsule Take 2,000 Units by mouth daily.     . lenalidomide (REVLIMID) 5 MG capsule Take 1 capsule (5 mg total) by mouth daily. 21 days on and 7 days off. 21 capsule 0  . Multiple Vitamins-Minerals (CENTRUM SILVER PO) Take 1 tablet by mouth daily.     . omeprazole (PRILOSEC) 20 MG capsule Take 1 capsule (20 mg total) by mouth daily. 30 capsule 6  . ondansetron (ZOFRAN) 8 MG tablet TAKE 1 TABLET BY MOUTH EVERY 8 HOURS AS NEEDED FOR NAUSEA AND VOMITING 60 tablet 0  . senna-docusate (SENOKOT-S) 8.6-50 MG tablet Take 1-2 tablets by mouth daily as needed for mild constipation or moderate constipation.     . sodium chloride (OCEAN) 0.65 % SOLN nasal spray Place 1 spray into both nostrils as needed for congestion.     No current facility-administered medications for this visit.     PHYSICAL EXAMINATION: ECOG PERFORMANCE STATUS: 0 - Asymptomatic  Vitals:   04/14/17 1312  BP: 135/74  Pulse: 68  Resp: 18  Temp: 98.6 F (37 C)  SpO2: 100%   Filed Weights   04/14/17 1312  Weight: 123 lb 14.4 oz (56.2 kg)    GENERAL:alert, no distress and comfortable SKIN: skin color, texture, turgor are normal, no rashes or significant lesions EYES: normal, Conjunctiva are pink and non-injected, sclera clear OROPHARYNX:no exudate, no erythema and lips, buccal mucosa, and tongue normal  NECK: supple, thyroid normal size, non-tender, without nodularity LYMPH:  no palpable lymphadenopathy in the cervical, axillary or inguinal LUNGS: clear to auscultation and percussion with normal breathing effort HEART: regular rate & rhythm and no murmurs and no lower extremity edema ABDOMEN:abdomen soft, non-tender and normal bowel sounds Musculoskeletal:no cyanosis of digits and no clubbing   NEURO: alert & oriented x 3 with fluent speech, no focal motor/sensory deficits  LABORATORY DATA:  I have reviewed the data as listed    Component Value Date/Time   NA 143 04/14/2017 1254   K 3.8 04/14/2017 1254   CL 108 07/19/2016 0419   CO2 22 04/14/2017 1254   GLUCOSE 106 04/14/2017 1254   BUN 9.4 04/14/2017 1254   CREATININE 0.8 04/14/2017 1254   CALCIUM 8.8 04/14/2017 1254   PROT 6.3 (L) 04/14/2017 1254   ALBUMIN 3.5 04/14/2017 1254   AST 20 04/14/2017 1254   ALT 24 04/14/2017 1254   ALKPHOS 88 04/14/2017 1254   BILITOT 0.26 04/14/2017 1254   GFRNONAA >60 07/19/2016 0419   GFRAA >60 07/19/2016 0419    No results found for: SPEP, UPEP  Lab Results  Component Value Date   WBC 4.8 04/14/2017   NEUTROABS 2.8 04/14/2017   HGB 10.2 (L) 04/14/2017   HCT 32.0 (L) 04/14/2017   MCV 84.4 04/14/2017   PLT 218   04/14/2017      Chemistry      Component Value Date/Time   NA 143 04/14/2017 1254   K 3.8 04/14/2017 1254   CL 108 07/19/2016 0419   CO2 22 04/14/2017 1254   BUN 9.4 04/14/2017 1254   CREATININE 0.8 04/14/2017 1254      Component Value Date/Time   CALCIUM 8.8 04/14/2017 1254   ALKPHOS 88 04/14/2017 1254   AST 20 04/14/2017 1254   ALT 24 04/14/2017 1254   BILITOT 0.26 04/14/2017 1254      ASSESSMENT & PLAN:  Multiple myeloma in remission ([facility]) Her recent myeloma panel shows she remained in remission She will continue on Revlimid, 21 days on, 7 days off. She will continue acyclovir for antimicrobial prophylaxis. She will take aspirin for DVT prophylaxis. She will continue calcium with vitamin D supplements.   We will continue Zometa every 3 months from now  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 This is likely anemia of chronic disease. The patient denies recent history of bleeding such as epistaxis, hematuria or hematochezia. She is asymptomatic from the anemia. We will observe for now.    No orders of the defined types were placed in this encounter.  All  questions were answered. The patient knows to call the clinic with any problems, questions or concerns. No barriers to learning was detected. I spent 10 minutes counseling the patient face to face. The total time spent in the appointment was 15 minutes and more than 50% was on counseling and review of test results     Ni Gorsuch, MD 04/15/2017 10:51 AM  

## 2017-04-15 NOTE — Assessment & Plan Note (Signed)
This is likely anemia of chronic disease. The patient denies recent history of bleeding such as epistaxis, hematuria or hematochezia. She is asymptomatic from the anemia. We will observe for now.  

## 2017-04-15 NOTE — Assessment & Plan Note (Signed)
Her recent myeloma panel shows she remained in remission She will continue on Revlimid, 21 days on, 7 days off. She will continue acyclovir for antimicrobial prophylaxis. She will take aspirin for DVT prophylaxis. She will continue calcium with vitamin D supplements.   We will continue Zometa every 3 months from now

## 2017-04-18 LAB — MULTIPLE MYELOMA PANEL, SERUM
Albumin SerPl Elph-Mcnc: 3.6 g/dL (ref 2.9–4.4)
Albumin/Glob SerPl: 1.5 (ref 0.7–1.7)
Alpha 1: 0.3 g/dL (ref 0.0–0.4)
Alpha2 Glob SerPl Elph-Mcnc: 0.9 g/dL (ref 0.4–1.0)
B-Globulin SerPl Elph-Mcnc: 1 g/dL (ref 0.7–1.3)
Gamma Glob SerPl Elph-Mcnc: 0.3 g/dL — ABNORMAL LOW (ref 0.4–1.8)
Globulin, Total: 2.5 g/dL (ref 2.2–3.9)
IgA, Qn, Serum: 32 mg/dL — ABNORMAL LOW (ref 87–352)
IgG, Qn, Serum: 336 mg/dL — ABNORMAL LOW (ref 700–1600)
IgM, Qn, Serum: 10 mg/dL — ABNORMAL LOW (ref 26–217)
Total Protein: 6.1 g/dL (ref 6.0–8.5)

## 2017-04-19 ENCOUNTER — Telehealth: Payer: Self-pay | Admitting: *Deleted

## 2017-04-19 NOTE — Telephone Encounter (Signed)
Notified of message below

## 2017-04-19 NOTE — Telephone Encounter (Signed)
-----   Message from [name], MD sent at 04/19/2017  8:42 AM EDT ----- Regarding: labs pls let her know labs showed she is still in remission ----- Message ----- From: Interface, Lab In Three Zero One Sent: 04/14/2017   1:08 PM To: [name], MD

## 2017-04-26 ENCOUNTER — Telehealth: Payer: Self-pay | Admitting: Obstetrics and Gynecology

## 2017-04-26 DIAGNOSIS — N83201 Unspecified ovarian cyst, right side: Secondary | ICD-10-CM

## 2017-04-26 NOTE — Telephone Encounter (Signed)
Spoke with patient. Patient requesting to schedule AEX and PUS same day if needed. Last AEX 01/09/16, PUS 10/16/15 was recommended PUS 1 year to f/u with ovarian cyst. Patient states she was advised by Dr. Quincy Simmonds at last AEX that she could schedule them together. Advised patient PUS and AEX are scheduled as two separate appointments, will review with Dr. Quincy Simmonds prior to scheduling.   Patient asking if pap is still recommended? Advised patient pap may be d/c after age 68, however, our office still recommends them d/t continued risk of cervical cancer after this age.   Advised patient would return call with recommendations, patient verbalizes understanding and is agreeable.   Dr. Quincy Simmonds -please advise on scheduling PUS and AEX?

## 2017-04-26 NOTE — Telephone Encounter (Signed)
Pelvic ultrasound and annual exam are done at separate visits.

## 2017-04-26 NOTE — Telephone Encounter (Signed)
This patient called requesting to schedule her AEX and possible ultrasound. She's requesting to have them both done on the same day, if possible. Patient was due for her AEX 01/08/17.

## 2017-04-27 ENCOUNTER — Telehealth: Payer: Self-pay | Admitting: Obstetrics and Gynecology

## 2017-04-27 ENCOUNTER — Other Ambulatory Visit: Payer: Self-pay

## 2017-04-27 MED ORDER — LENALIDOMIDE 5 MG PO CAPS: 5.0000 mg | ORAL_CAPSULE | Freq: Every day | ORAL | 0 refills | Status: DC

## 2017-04-27 NOTE — Telephone Encounter (Signed)
Patient want to speak to you again about her appointments.

## 2017-04-27 NOTE — Telephone Encounter (Signed)
Spoke with patient. Scheduled for PUS on 05/12/17 at 10am, consult to follow at 10:30am with Dr. Quincy Simmonds. Patient declined to schedule AEX at this time. Patient request return call for review of benefits for PUS. Patient is agreeable to date and time.   Order placed for PUS.  Routing to provider for final review. Patient is agreeable to disposition. Will close encounter.  Cc: Lerry Liner

## 2017-04-27 NOTE — Telephone Encounter (Signed)
Spoke with patient. Patient states she has an appointment with [facility] coming up for f/u, would like to possibly cancel PUS until after that appointment. Patient states she is unsure of coverage for PUS. Advised patient can have Deloris Ping in our insurance and benefits department review benefits with her, patient is agreeable. Advised patient Deloris Ping is on the other line, can have her return call, patient agreeable to return call. PUS kept as scheduled until review of benefits.   Deloris Ping, can you return call to patient to review PUS benefit coverage?  Cc: Dr. Quincy Simmonds

## 2017-04-28 NOTE — Telephone Encounter (Signed)
Thank you for the update.  Cc- Jessica [name]

## 2017-04-28 NOTE — Telephone Encounter (Signed)
Spoke with patient regarding benefit for scheduled ultrasound. Patient understood and agreeable.  Patient to keep scheduled appointment on 05/12/17 with Dr Quincy Simmonds. Patient aware of appointment date, arrival time and cancellation policy. No further questions.  Ok to close   cc: Dr Quincy Simmonds

## 2017-05-02 ENCOUNTER — Other Ambulatory Visit: Payer: Self-pay | Admitting: Hematology and Oncology

## 2017-05-12 ENCOUNTER — Encounter: Payer: Self-pay | Admitting: Obstetrics and Gynecology

## 2017-05-12 ENCOUNTER — Ambulatory Visit (INDEPENDENT_AMBULATORY_CARE_PROVIDER_SITE_OTHER): Payer: Medicare Other | Admitting: Obstetrics and Gynecology

## 2017-05-12 ENCOUNTER — Ambulatory Visit (INDEPENDENT_AMBULATORY_CARE_PROVIDER_SITE_OTHER): Payer: Medicare Other

## 2017-05-12 VITALS — BP 146/80 | HR 70 | Ht 60.25 in | Wt 124.0 lb

## 2017-05-12 DIAGNOSIS — N83201 Unspecified ovarian cyst, right side: Secondary | ICD-10-CM | POA: Diagnosis not present

## 2017-05-12 NOTE — Progress Notes (Signed)
Encounter reviewed by Dr. Lateka Rady Amundson C. Silva.  

## 2017-05-12 NOTE — Patient Instructions (Signed)
Ovarian Cyst An ovarian cyst is a fluid-filled sac that forms on an ovary. The ovaries are small organs that produce eggs in women. Various types of cysts can form on the ovaries. Some may cause symptoms and require treatment. Most ovarian cysts go away on their own, are not cancerous (are benign), and do not cause problems. Common types of ovarian cysts include:  Functional (follicle) cysts. ? Occur during the menstrual cycle, and usually go away with the next menstrual cycle if you do not get pregnant. ? Usually cause no symptoms.  Endometriomas. ? Are cysts that form from the tissue that lines the uterus (endometrium). ? Are sometimes called "chocolate cysts" because they become filled with blood that turns brown. ? Can cause pain in the lower abdomen during intercourse and during your period.  Cystadenoma cysts. ? Develop from cells on the outside surface of the ovary. ? Can get very large and cause lower abdomen pain and pain with intercourse. ? Can cause severe pain if they twist or break open (rupture).  Dermoid cysts. ? Are sometimes found in both ovaries. ? May contain different kinds of body tissue, such as skin, teeth, hair, or cartilage. ? Usually do not cause symptoms unless they get very big.  Theca lutein cysts. ? Occur when too much of a certain hormone (human chorionic gonadotropin) is produced and overstimulates the ovaries to produce an egg. ? Are most common after having procedures used to assist with the conception of a baby (in vitro fertilization).  What are the causes? Ovarian cysts may be caused by:  Ovarian hyperstimulation syndrome. This is a condition that can develop from taking fertility medicines. It causes multiple large ovarian cysts to form.  Polycystic ovarian syndrome (PCOS). This is a common hormonal disorder that can cause ovarian cysts, as well as problems with your period or fertility.  What increases the risk? The following factors may make  you more likely to develop ovarian cysts:  Being overweight or obese.  Taking fertility medicines.  Taking certain forms of hormonal birth control.  Smoking.  What are the signs or symptoms? Many ovarian cysts do not cause symptoms. If symptoms are present, they may include:  Pelvic pain or pressure.  Pain in the lower abdomen.  Pain during sex.  Abdominal swelling.  Abnormal menstrual periods.  Increasing pain with menstrual periods.  How is this diagnosed? These cysts are commonly found during a routine pelvic exam. You may have tests to find out more about the cyst, such as:  Ultrasound.  X-ray of the pelvis.  CT scan.  MRI.  Blood tests.  How is this treated? Many ovarian cysts go away on their own without treatment. Your health care provider may want to check your cyst regularly for 2-3 months to see if it changes. If you are in menopause, it is especially important to have your cyst monitored closely because menopausal women have a higher rate of ovarian cancer. When treatment is needed, it may include:  Medicines to help relieve pain.  A procedure to drain the cyst (aspiration).  Surgery to remove the whole cyst.  Hormone treatment or birth control pills. These methods are sometimes used to help dissolve a cyst.  Follow these instructions at home:  Take over-the-counter and prescription medicines only as told by your health care provider.  Do not drive or use heavy machinery while taking prescription pain medicine.  Get regular pelvic exams and Pap tests as often as told by your health care   provider.  Return to your normal activities as told by your health care provider. Ask your health care provider what activities are safe for you.  Do not use any products that contain nicotine or tobacco, such as cigarettes and e-cigarettes. If you need help quitting, ask your health care provider.  Keep all follow-up visits as told by your health care provider.  This is important. Contact a health care provider if:  Your periods are late, irregular, or painful, or they stop.  You have pelvic pain that does not go away.  You have pressure on your bladder or trouble emptying your bladder completely.  You have pain during sex.  You have any of the following in your abdomen: ? A feeling of fullness. ? Pressure. ? Discomfort. ? Pain that does not go away. ? Swelling.  You feel generally ill.  You become constipated.  You lose your appetite.  You develop severe acne.  You start to have more body hair and facial hair.  You are gaining weight or losing weight without changing your exercise and eating habits.  You think you may be pregnant. Get help right away if:  You have abdominal pain that is severe or gets worse.  You cannot eat or drink without vomiting.  You suddenly develop a fever.  Your menstrual period is much heavier than usual. This information is not intended to replace advice given to you by your health care provider. Make sure you discuss any questions you have with your health care provider. Document Released: 07/26/2005 Document Revised: 02/13/2016 Document Reviewed: 12/28/2015 Elsevier Interactive Patient Education  2018 Elsevier Inc.  

## 2017-05-12 NOTE — Progress Notes (Signed)
Patient ID: Jessica [name], female   DOB: May 21, 1951, 66 y.o.   MRN: [id number] GYNECOLOGY  VISIT   HPI: 66 y.o.   Married  Caucasian  female   G1P1 with Patient's last menstrual period was 08/10/2003.   here for pelvic ultrasound for right ovarian simple cyst.    Last US done 10/16/15 - Uterus with no masses. EMS 1.81 mm.  Right ovary with 16 mm simple avascular cyst.  No free fluid.  This ovarian cyst was first noted when she had an MRI at [facility] in 2017.   CA125 8 on 10/16/15.  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  Does her check ups w/ Tracy every 3 months.  Some diarrhea and off balance today which is part of her clinical picture per patient.   Not sexually active for years.   PCP - Dr. Mertha Finders  GYNECOLOGIC HISTORY: Patient's last menstrual period was 08/10/2003. Contraception:  Vasectomy/Postmenopausal Menopausal hormone therapy:  none Last mammogram: 05-31-16 3D Density B/Neg/BiRads1:[facility] Last pap smear: 01/09/16 - Neg.                             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 Chronic GERD 01/13/2017  . 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 08/10/2016  . Hypokalemia 07/16/2016  . Pneumonia 07/13/2016  . Atypical chest pain 07/13/2016  . Chest pain on breathing 07/12/2016  . Mucositis due to antineoplastic therapy 07/09/2016  . Candidal esophagitis ([place]) 04/30/2016  . Herpes simplex 04/30/2016  . Chronic kidney disease, stage III (moderate) ([place]) 03/12/2016  . Hypokalemia, gastrointestinal losses 01/16/2016  . Pancytopenia, acquired ([place]) 10/20/2015  . Nasal congestion 10/20/2015  . Varicose vein of leg 08/25/2015  . S/P bone marrow transplant ([place]) 04/22/2015  . Hypotension due to drugs 04/22/2015  . Petechiae 02/14/2015  . Neuropathy due to chemotherapeutic  drug ([place]) 02/07/2015  . Essential hypertension 02/07/2015  . Infection of eyelid 01/16/2015  . Protein calorie malnutrition ([place]) 01/16/2015  . Superficial thrombophlebitis of upper extremity 12/24/2014  . Sty, external 12/24/2014  . Other constipation 12/03/2014  . Nausea and vomiting 12/02/2014  . Dehydration 12/02/2014  . Weakness 12/02/2014  . Multiple myeloma in remission ([place]) 11/19/2014  . Prerenal renal failure 11/19/2014  . 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[place]) 11/19/2014  . Neuropathic pain of left flank 11/19/2014    Past Medical History:  Diagnosis Date  . H/O stem cell transplant [facility]) 03/2015   [facility]  . Multiple myeloma ([place]) 11/19/2014  . Multiple myeloma ([place])   . Multiple myeloma ([place])   . MVP (mitral valve prolapse)    req prophylaxis  . Other constipation 12/03/2014  . Right ovarian cyst 10/16/15   16 mm simple cyst - ultrasound yearly.  Marland Kitchen Upper respiratory infection, acute 08/12/2015    Past Surgical History:  Procedure Laterality Date  . CATARACT EXTRACTION, BILATERAL  10/2010   Implants(ReSTOR) Bilat  . LASER ABLATION OF CONDYLOMAS  1990   CIN 1 cervix  . multiple myeloma surgery Right 08/2014  [facility]    Current Outpatient Prescriptions  Medication Sig Dispense Refill  . acetaminophen (TYLENOL) 500 MG tablet Take 500-1,000 mg by mouth every 6 (six) hours as needed for mild pain, moderate pain, fever or headache.    Marland Kitchen acyclovir (ZOVIRAX) 400 MG tablet Take 1 tablet (400 mg total) by mouth 2 (two) times daily. 60 tablet 9  . aspirin EC 81 MG tablet Take 81 mg by mouth daily with breakfast.    . calcium carbonate (OSCAL) 1500 (600 Ca) MG TABS tablet Take 1,500 mg by mouth daily with breakfast.    . Cholecalciferol (VITAMIN D3) 2000 units capsule Take 2,000 Units by mouth daily.     Marland Kitchen lenalidomide (REVLIMID) 5 MG capsule Take 1 capsule (5 mg total) by mouth daily. 21 days on and 7 days off. 21 capsule 0  .  Multiple Vitamins-Minerals (CENTRUM SILVER PO) Take 1 tablet by mouth daily.     Marland Kitchen omeprazole (PRILOSEC) 20 MG capsule Take 1 capsule (20 mg total) by mouth daily. 30 capsule 6  . ondansetron (ZOFRAN) 8 MG tablet TAKE 1 TABLET BY MOUTH EVERY 8 HOURS AS NEEDED FOR NAUSEA AND VOMITING 60 tablet 0  . senna-docusate (SENOKOT-S) 8.6-50 MG tablet Take 1-2 tablets by mouth daily as needed for mild constipation or moderate constipation.     . sodium chloride (OCEAN) 0.65 % SOLN nasal spray Place 1 spray into both nostrils as needed for congestion.     No current facility-administered medications for this visit.      ALLERGIES: Augmentin [amoxicillin-pot clavulanate]; Albuterol; Codeine; Ibuprofen; and Nsaids  Family History  Problem Relation Age of Onset  . Osteoporosis Mother   . Diabetes Father     Social History   Social History  . Marital status: Married    Spouse name: N/A  . Number of children: N/A  . Years of education: N/A   Occupational History  . Not on file.   Social History Main Topics  . Smoking status: Never Smoker  . Smokeless tobacco: Never Used  . Alcohol use No  . Drug use: No  . Sexual activity: Not Currently    Partners: Male    Birth control/ protection: Post-menopausal     Comment: vasectomy   Other Topics Concern  . Not on file   Social History Narrative  . No narrative on file    ROS:  Pertinent items are noted in HPI.  PHYSICAL EXAMINATION:    BP (!) 146/80 (BP Location: Right Arm, Patient Position: Sitting, Cuff Size: Normal)   Pulse 70   Ht 5' 0.25" (1.53 m)   Wt 124 lb (56.2 kg)   LMP 08/10/2003   BMI 24.02 kg/m     General appearance: alert, cooperative and appears stated age  Pelvic US Uterus - no masses. EMS 1.54.  Right ovarian simple cyst - 19 x 15 mm. Avascular.  [place].  Left ovary atrophic.  No free fluid.  ASSESSMENT  Small simple ovarian cyst.  Stable.  Status post stem cell transplant for multiple myeloma.  Doing  well.   PLAN  We discussed the ovarian cyst and its benign nature due to its characteristics on [place] and the prior CA125 which was normal. We will plan to monitor every 2 years.  She will return for an annual exam next year.  She declines this this year.    An After Visit Summary was printed and given to the patient.  ___15___ minutes face to face time of which  over 50% was spent in counseling.

## 2017-05-13 DIAGNOSIS — N83201 Unspecified ovarian cyst, right side: Secondary | ICD-10-CM | POA: Insufficient documentation

## 2017-05-20 ENCOUNTER — Other Ambulatory Visit: Payer: Self-pay

## 2017-05-20 MED ORDER — LENALIDOMIDE 5 MG PO CAPS: 5.0000 mg | ORAL_CAPSULE | Freq: Every day | ORAL | 0 refills | Status: DC

## 2017-06-23 ENCOUNTER — Other Ambulatory Visit: Payer: Self-pay

## 2017-06-23 MED ORDER — LENALIDOMIDE 5 MG PO CAPS: 5.0000 mg | ORAL_CAPSULE | Freq: Every day | ORAL | 0 refills | Status: DC

## 2017-07-04 ENCOUNTER — Other Ambulatory Visit: Payer: Self-pay | Admitting: Hematology and Oncology

## 2017-07-14 ENCOUNTER — Ambulatory Visit (HOSPITAL_BASED_OUTPATIENT_CLINIC_OR_DEPARTMENT_OTHER): Payer: Medicare Other

## 2017-07-14 ENCOUNTER — Encounter: Payer: Self-pay | Admitting: Hematology and Oncology

## 2017-07-14 ENCOUNTER — Other Ambulatory Visit (HOSPITAL_BASED_OUTPATIENT_CLINIC_OR_DEPARTMENT_OTHER): Payer: Medicare Other

## 2017-07-14 ENCOUNTER — Ambulatory Visit: Payer: Medicare Other | Admitting: Hematology and Oncology

## 2017-07-14 ENCOUNTER — Other Ambulatory Visit: Payer: Self-pay | Admitting: Hematology and Oncology

## 2017-07-14 ENCOUNTER — Telehealth: Payer: Self-pay | Admitting: Hematology and Oncology

## 2017-07-14 DIAGNOSIS — D638 Anemia in other chronic diseases classified elsewhere: Secondary | ICD-10-CM

## 2017-07-14 DIAGNOSIS — Z9481 Bone marrow transplant status: Secondary | ICD-10-CM

## 2017-07-14 DIAGNOSIS — C9001 Multiple myeloma in remission: Secondary | ICD-10-CM

## 2017-07-14 DIAGNOSIS — N183 Chronic kidney disease, stage 3 unspecified: Secondary | ICD-10-CM

## 2017-07-14 LAB — CBC WITH DIFFERENTIAL/PLATELET
BASO%: 1.3 % (ref 0.0–2.0)
Basophils Absolute: 0.1 10*3/uL (ref 0.0–0.1)
EOS%: 9 % — ABNORMAL HIGH (ref 0.0–7.0)
Eosinophils Absolute: 0.4 10*3/uL (ref 0.0–0.5)
HCT: 31.6 % — ABNORMAL LOW (ref 34.8–46.6)
HGB: 10.2 g/dL — ABNORMAL LOW (ref 11.6–15.9)
LYMPH%: 29 % (ref 14.0–49.7)
MCH: 26.8 pg (ref 25.1–34.0)
MCHC: 32.3 g/dL (ref 31.5–36.0)
MCV: 83.2 fL (ref 79.5–101.0)
MONO#: 0.5 10*3/uL (ref 0.1–0.9)
MONO%: 10.3 % (ref 0.0–14.0)
NEUT#: 2.4 10*3/uL (ref 1.5–6.5)
NEUT%: 50.4 % (ref 38.4–76.8)
Platelets: 186 10*3/uL (ref 145–400)
RBC: 3.8 10*6/uL (ref 3.70–5.45)
RDW: 16.6 % — ABNORMAL HIGH (ref 11.2–14.5)
WBC: 4.8 10*3/uL (ref 3.9–10.3)
lymph#: 1.4 10*3/uL (ref 0.9–3.3)

## 2017-07-14 LAB — COMPREHENSIVE METABOLIC PANEL
ALT: 21 U/L (ref 0–55)
AST: 21 U/L (ref 5–34)
Albumin: 3.6 g/dL (ref 3.5–5.0)
Alkaline Phosphatase: 78 U/L (ref 40–150)
Anion Gap: 10 mEq/L (ref 3–11)
BUN: 9.3 mg/dL (ref 7.0–26.0)
CO2: 20 mEq/L — ABNORMAL LOW (ref 22–29)
Calcium: 8.4 mg/dL (ref 8.4–10.4)
Chloride: 113 mEq/L — ABNORMAL HIGH (ref 98–109)
Creatinine: 0.8 mg/dL (ref 0.6–1.1)
EGFR: >60 mL/min/{1.73_m2} (ref >60)
Glucose: 106 mg/dl (ref 70–140)
Potassium: 3.3 mEq/L — ABNORMAL LOW (ref 3.5–5.1)
Sodium: 143 mEq/L (ref 136–145)
Total Bilirubin: 0.43 mg/dL (ref 0.20–1.20)
Total Protein: 6.2 g/dL — ABNORMAL LOW (ref 6.4–8.3)

## 2017-07-14 MED ORDER — SODIUM CHLORIDE 0.9 % IV SOLN
3.0000 mg | Freq: Once | INTRAVENOUS | Status: AC
  Administered 2017-07-14: 3 mg via INTRAVENOUS
  Filled 2017-07-14: qty 3.75

## 2017-07-14 MED ORDER — SODIUM CHLORIDE 0.9 % IV SOLN
Freq: Once | INTRAVENOUS | Status: AC
  Administered 2017-07-14: 14:00:00 via INTRAVENOUS

## 2017-07-14 NOTE — Assessment & Plan Note (Signed)
Her recent myeloma panel shows she remained in remission She will continue on Revlimid, 21 days on, 7 days off. She will continue acyclovir for antimicrobial prophylaxis. She will take aspirin for DVT prophylaxis. She will continue calcium with vitamin D supplements.   We will continue Zometa every 3 months from now

## 2017-07-14 NOTE — Telephone Encounter (Signed)
Gave avs and calendar for March 2019 °

## 2017-07-14 NOTE — Progress Notes (Signed)
[place] OFFICE PROGRESS NOTE  Patient Care Team: Josetta Huddle, MD as PCP - General (Internal Medicine) Ginette Pitman, MD as Consulting Physician (Hematology and Oncology) Hessie Dibble, MD as Consulting Physician (Hematology and Oncology)  SUMMARY OF ONCOLOGIC HISTORY: Oncology History    M-protein 0.69 gm/dl IFIX - IgG, Kappa IgG - 868 IgA - 19 IgM - < 20 Kappa - 21 Lambda - 5.7  09/06/2014 - Bone marrow aspirate and biopsy:   Normocellular marrow for age (40%) with a small monoclonal plasma cell population (1% on aspirate). Karyotype 63, XX  FISH Negative for myeloma associated changes  09/12/2014 - PET/CT  Two regions that are concerning for disease, one adjacent/involving the left ninth rib and one in the marrow of the right femur, in this patient with history of plasmacytoma.      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      09/06/2014 Bone Marrow Biopsy    BM performed at [facility] is not consistent with multiple myeloma, 1% plasma cell on aspirate      09/12/2014 Imaging     PET CT scan show involvement of left ninth rib and right femur      09/23/2014 - 10/23/2014 Radiation Therapy     she had radiation therapy to the cavernous sinus and skull base lesions, 45 Gy      10/21/2014 - 11/01/2014 Radiation Therapy     she had radiation to right femur , total 30 Gy      11/26/2014 - 02/14/2015 Chemotherapy     she is started on weekly dexamethasone, Velcade twice a week on day 1, 4, 8 and 11 and Revlimid days 1-14.      04/01/2015 Bone Marrow Transplant    She received  melphalan chemotherapy on 03/31/2015 followed by autologous stem cell transplant the day after      04/03/2015 - 04/18/2015 Hospital Admission    The patient was admitted to the hospital at [facility] for management related to complication from stem cell transplant. She had significant nausea requiring intravenous anti-emetics.      07/17/2015 -  Chemotherapy    She started maintenance Revlimid and monthly zometa       INTERVAL HISTORY: Please see below for problem oriented charting. She returns with her husband for further follow-up She denies recent infection No new bone pain She has no problem getting Revlimid refilled She is up-to-date with her vaccination and preventative health care program She had recent dental visit with no dental issues  REVIEW OF SYSTEMS:   Constitutional: Denies fevers, chills or abnormal weight loss Eyes: Denies blurriness of vision Ears, nose, mouth, throat, and face: Denies mucositis or sore throat Respiratory: Denies cough, dyspnea or wheezes Cardiovascular: Denies palpitation, chest discomfort or lower extremity swelling Gastrointestinal:  Denies nausea, heartburn or change in bowel habits Skin: Denies abnormal skin rashes Lymphatics: Denies new lymphadenopathy or easy bruising Neurological:Denies numbness, tingling or new weaknesses Behavioral/Psych: Mood is stable, no new changes  All other systems were reviewed with the patient and are negative.  I have reviewed the past medical  history, past surgical history, social history and family history with the patient and they are unchanged from previous note.  ALLERGIES:  is allergic to augmentin [amoxicillin-pot clavulanate]; albuterol; codeine; ibuprofen; and nsaids.  MEDICATIONS:  Current Outpatient Medications  Medication Sig Dispense Refill  . acetaminophen (TYLENOL) 500 MG tablet Take 500-1,000 mg by mouth every 6 (six) hours as needed for mild pain, moderate pain, fever or headache.    Marland Kitchen  acyclovir (ZOVIRAX) 400 MG tablet Take 1 tablet (400 mg total) by mouth 2 (two) times daily. 60 tablet 9  . aspirin EC 81 MG tablet Take 81 mg by mouth daily with breakfast.    . calcium carbonate (OSCAL) 1500 (600 Ca) MG TABS tablet Take 1,500 mg by mouth daily with breakfast.    . Cholecalciferol (VITAMIN D3) 2000 units capsule Take 2,000 Units by mouth daily.     Marland Kitchen lenalidomide (REVLIMID) 5 MG capsule Take 1 capsule (5 mg total) daily by mouth. 21 days on and 7 days off. 21 capsule 0  . Multiple Vitamins-Minerals (CENTRUM SILVER PO) Take 1 tablet by mouth daily.     Marland Kitchen omeprazole (PRILOSEC) 20 MG capsule Take 1 capsule (20 mg total) by mouth daily. 30 capsule 6  . ondansetron (ZOFRAN) 8 MG tablet TAKE 1 TABLET BY MOUTH EVERY 8 HOURS AS NEEDED FOR NAUSEA AND VOMITING 60 tablet 0  . senna-docusate (SENOKOT-S) 8.6-50 MG tablet Take 1-2 tablets by mouth daily as needed for mild constipation or moderate constipation.     . sodium chloride (OCEAN) 0.65 % SOLN nasal spray Place 1 spray into both nostrils as needed for congestion.     No current facility-administered medications for this visit.     PHYSICAL EXAMINATION: ECOG PERFORMANCE STATUS: 1 - Symptomatic but completely ambulatory  Vitals:   07/14/17 1307  BP: 132/79  Pulse: 65  Resp: 17  Temp: 98.4 F (36.9 C)  SpO2: 100%   Filed Weights   07/14/17 1307  Weight: 124 lb 4.8 oz (56.4 kg)    GENERAL:alert, no distress and comfortable SKIN: skin color, texture, turgor are normal, no rashes or significant lesions EYES: normal, Conjunctiva are pink and non-injected, sclera clear OROPHARYNX:no exudate, no erythema and lips, buccal mucosa, and tongue normal  NECK: supple, thyroid normal size, non-tender, without nodularity LYMPH:  no palpable lymphadenopathy in the cervical, axillary or inguinal LUNGS: clear to auscultation and percussion with normal breathing effort HEART: regular rate & rhythm and no murmurs and no lower extremity  edema ABDOMEN:abdomen soft, non-tender and normal bowel sounds Musculoskeletal:no cyanosis of digits and no clubbing  NEURO: alert & oriented x 3 with fluent speech, no focal motor/sensory deficits  LABORATORY DATA:  I have reviewed the data as listed    Component Value Date/Time   NA 143 04/14/2017 1254   K 3.8 04/14/2017 1254   CL 108 07/19/2016 0419   CO2 22 04/14/2017 1254   GLUCOSE 106 04/14/2017 1254   BUN 9.4 04/14/2017 1254   CREATININE 0.8 04/14/2017 1254   CALCIUM 8.8 04/14/2017 1254   PROT 6.3 (L) 04/14/2017 1254   PROT 6.1 04/14/2017 1254   ALBUMIN 3.5 04/14/2017 1254   AST 20 04/14/2017 1254   ALT 24 04/14/2017 1254   ALKPHOS 88 04/14/2017 1254   BILITOT 0.26 04/14/2017 1254   GFRNONAA >60 07/19/2016 0419   GFRAA >60 07/19/2016 0419    No results found for: SPEP, UPEP  Lab Results  Component Value Date   WBC 4.8 07/14/2017  NEUTROABS 2.4 07/14/2017   HGB 10.2 (L) 07/14/2017   HCT 31.6 (L) 07/14/2017   MCV 83.2 07/14/2017   PLT 186 07/14/2017      Chemistry      Component Value Date/Time   NA 143 04/14/2017 1254   K 3.8 04/14/2017 1254   CL 108 07/19/2016 0419   CO2 22 04/14/2017 1254   BUN 9.4 04/14/2017 1254   CREATININE 0.8 04/14/2017 1254      Component Value Date/Time   CALCIUM 8.8 04/14/2017 1254   ALKPHOS 88 04/14/2017 1254   AST 20 04/14/2017 1254   ALT 24 04/14/2017 1254   BILITOT 0.26 04/14/2017 1254      ASSESSMENT & PLAN:  Multiple myeloma in remission ([place]) Her recent myeloma panel shows she remained in remission She will continue on Revlimid, 21 days on, 7 days off. She will continue acyclovir for antimicrobial prophylaxis. She will take aspirin for DVT prophylaxis. She will continue calcium with vitamin D supplements.   We will continue Zometa every 3 months from now  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 This is likely anemia of chronic disease. The patient denies recent history of bleeding such as epistaxis, hematuria or  hematochezia. She is asymptomatic from the anemia. We will observe for now.   Chronic kidney disease, stage III (moderate) Her kidney function is stable.  We will continue reduce dose Zometa   No orders of the defined types were placed in this encounter.  All questions were answered. The patient knows to call the clinic with any problems, questions or concerns. No barriers to learning was detected. I spent 15 minutes counseling the patient face to face. The total time spent in the appointment was 20 minutes and more than 50% was on counseling and review of test results     [name], MD 07/14/2017 1:25 PM

## 2017-07-14 NOTE — Patient Instructions (Addendum)

## 2017-07-14 NOTE — Assessment & Plan Note (Signed)
This is likely anemia of chronic disease. The patient denies recent history of bleeding such as epistaxis, hematuria or hematochezia. She is asymptomatic from the anemia. We will observe for now.  

## 2017-07-14 NOTE — Assessment & Plan Note (Signed)
Her kidney function is stable.  We will continue reduce dose Zometa

## 2017-07-15 LAB — KAPPA/LAMBDA LIGHT CHAINS
Ig Kappa Free Light Chain: 6.3 mg/L (ref 3.3–19.4)
Ig Lambda Free Light Chain: 6.7 mg/L (ref 5.7–26.3)
Kappa/Lambda FluidC Ratio: 0.94 (ref 0.26–1.65)

## 2017-07-18 LAB — MULTIPLE MYELOMA PANEL, SERUM
Albumin SerPl Elph-Mcnc: 3.5 g/dL (ref 2.9–4.4)
Albumin/Glob SerPl: 1.5 (ref 0.7–1.7)
Alpha 1: 0.3 g/dL (ref 0.0–0.4)
Alpha2 Glob SerPl Elph-Mcnc: 0.8 g/dL (ref 0.4–1.0)
B-Globulin SerPl Elph-Mcnc: 1.1 g/dL (ref 0.7–1.3)
Gamma Glob SerPl Elph-Mcnc: 0.4 g/dL (ref 0.4–1.8)
Globulin, Total: 2.5 g/dL (ref 2.2–3.9)
IgA, Qn, Serum: 46 mg/dL — ABNORMAL LOW (ref 87–352)
IgG, Qn, Serum: 372 mg/dL — ABNORMAL LOW (ref 700–1600)
IgM, Qn, Serum: 9 mg/dL — ABNORMAL LOW (ref 26–217)
Total Protein: 6 g/dL (ref 6.0–8.5)

## 2017-07-21 ENCOUNTER — Other Ambulatory Visit: Payer: Self-pay

## 2017-07-21 MED ORDER — LENALIDOMIDE 5 MG PO CAPS: 5.0000 mg | ORAL_CAPSULE | Freq: Every day | ORAL | 0 refills | Status: DC

## 2017-08-17 ENCOUNTER — Other Ambulatory Visit: Payer: Self-pay | Admitting: *Deleted

## 2017-08-17 MED ORDER — LENALIDOMIDE 5 MG PO CAPS: 5.0000 mg | ORAL_CAPSULE | Freq: Every day | ORAL | 0 refills | Status: DC

## 2017-08-30 ENCOUNTER — Encounter: Payer: Self-pay | Admitting: Obstetrics and Gynecology

## 2017-08-30 ENCOUNTER — Other Ambulatory Visit: Payer: Self-pay | Admitting: Hematology and Oncology

## 2017-09-16 ENCOUNTER — Other Ambulatory Visit: Payer: Self-pay | Admitting: *Deleted

## 2017-09-16 MED ORDER — LENALIDOMIDE 5 MG PO CAPS: 5.0000 mg | ORAL_CAPSULE | Freq: Every day | ORAL | 0 refills | Status: DC

## 2017-10-13 ENCOUNTER — Inpatient Hospital Stay: Payer: Medicare Other

## 2017-10-13 ENCOUNTER — Telehealth: Payer: Self-pay | Admitting: Hematology and Oncology

## 2017-10-13 ENCOUNTER — Other Ambulatory Visit: Payer: Self-pay | Admitting: Hematology and Oncology

## 2017-10-13 ENCOUNTER — Encounter: Payer: Self-pay | Admitting: Hematology and Oncology

## 2017-10-13 ENCOUNTER — Inpatient Hospital Stay: Payer: Medicare Other | Attending: Hematology and Oncology | Admitting: Hematology and Oncology

## 2017-10-13 ENCOUNTER — Other Ambulatory Visit: Payer: Self-pay

## 2017-10-13 DIAGNOSIS — Z79899 Other long term (current) drug therapy: Secondary | ICD-10-CM | POA: Insufficient documentation

## 2017-10-13 DIAGNOSIS — Z7982 Long term (current) use of aspirin: Secondary | ICD-10-CM | POA: Diagnosis not present

## 2017-10-13 DIAGNOSIS — D638 Anemia in other chronic diseases classified elsewhere: Secondary | ICD-10-CM | POA: Insufficient documentation

## 2017-10-13 DIAGNOSIS — R197 Diarrhea, unspecified: Secondary | ICD-10-CM

## 2017-10-13 DIAGNOSIS — Z9481 Bone marrow transplant status: Secondary | ICD-10-CM

## 2017-10-13 DIAGNOSIS — Z923 Personal history of irradiation: Secondary | ICD-10-CM

## 2017-10-13 DIAGNOSIS — C9001 Multiple myeloma in remission: Secondary | ICD-10-CM | POA: Diagnosis not present

## 2017-10-13 DIAGNOSIS — R42 Dizziness and giddiness: Secondary | ICD-10-CM | POA: Insufficient documentation

## 2017-10-13 LAB — CBC WITH DIFFERENTIAL/PLATELET
Basophils Absolute: 0.1 10*3/uL (ref 0.0–0.1)
Basophils Relative: 3 %
Eosinophils Absolute: 0.5 10*3/uL (ref 0.0–0.5)
Eosinophils Relative: 13 %
HCT: 32.4 % — ABNORMAL LOW (ref 34.8–46.6)
Hemoglobin: 10.4 g/dL — ABNORMAL LOW (ref 11.6–15.9)
Lymphocytes Relative: 29 %
Lymphs Abs: 1.2 10*3/uL (ref 0.9–3.3)
MCH: 26.6 pg (ref 25.1–34.0)
MCHC: 32.2 g/dL (ref 31.5–36.0)
MCV: 82.6 fL (ref 79.5–101.0)
Monocytes Absolute: 0.5 10*3/uL (ref 0.1–0.9)
Monocytes Relative: 11 %
Neutro Abs: 1.8 10*3/uL (ref 1.5–6.5)
Neutrophils Relative %: 44 %
Platelets: 234 10*3/uL (ref 145–400)
RBC: 3.92 MIL/uL (ref 3.70–5.45)
RDW: 18.1 % — ABNORMAL HIGH (ref 11.2–14.5)
WBC: 4.1 10*3/uL (ref 3.9–10.3)

## 2017-10-13 LAB — COMPREHENSIVE METABOLIC PANEL
ALT: 29 U/L (ref 0–55)
AST: 27 U/L (ref 5–34)
Albumin: 3.6 g/dL (ref 3.5–5.0)
Alkaline Phosphatase: 99 U/L (ref 40–150)
Anion gap: 7 (ref 3–11)
BUN: 12 mg/dL (ref 7–26)
CO2: 21 mmol/L — ABNORMAL LOW (ref 22–29)
Calcium: 8.9 mg/dL (ref 8.4–10.4)
Chloride: 111 mmol/L — ABNORMAL HIGH (ref 98–109)
Creatinine, Ser: 0.84 mg/dL (ref 0.60–1.10)
GFR calc Af Amer: >60 mL/min (ref >60)
GFR calc non Af Amer: >60 mL/min (ref >60)
Glucose, Bld: 97 mg/dL (ref 70–140)
Potassium: 3.8 mmol/L (ref 3.5–5.1)
Sodium: 139 mmol/L (ref 136–145)
Total Bilirubin: 0.4 mg/dL (ref 0.2–1.2)
Total Protein: 6.1 g/dL — ABNORMAL LOW (ref 6.4–8.3)

## 2017-10-13 MED ORDER — ZOLEDRONIC ACID 4 MG/5ML IV CONC
3.0000 mg | Freq: Once | INTRAVENOUS | Status: AC
  Administered 2017-10-13: 3 mg via INTRAVENOUS
  Filled 2017-10-13: qty 3.75

## 2017-10-13 MED ORDER — LENALIDOMIDE 5 MG PO CAPS: 5.0000 mg | ORAL_CAPSULE | Freq: Every day | ORAL | 0 refills | Status: DC

## 2017-10-13 NOTE — Assessment & Plan Note (Signed)
Her recent myeloma panel shows she remained in remission She will continue on Revlimid, 21 days on, 7 days off. She will continue acyclovir for antimicrobial prophylaxis. She will take aspirin for DVT prophylaxis. She will continue calcium with vitamin D supplements.   We will continue Zometa every 3 months from now She will be due for DEXA scan at the end of the year

## 2017-10-13 NOTE — Telephone Encounter (Signed)
Gave avs and calendar for june °

## 2017-10-13 NOTE — Assessment & Plan Note (Signed)
She has intermittent diarrhea of unknown etiology It could be related to dietary problem versus Revlimid We discussed conservative management with aggressive hydration and Imodium as needed

## 2017-10-13 NOTE — Patient Instructions (Signed)
[facility] Discharge Instructions for Patients Receiving Chemotherapy  Today you received the following chemotherapy agents Zometa  To help prevent nausea and vomiting after your treatment, we encourage you to take your nausea medication as directed  If you develop nausea and vomiting that is not controlled by your nausea medication, call the clinic.   BELOW ARE SYMPTOMS THAT SHOULD BE REPORTED IMMEDIATELY:  *FEVER GREATER THAN 100.5 F  *CHILLS WITH OR WITHOUT FEVER  NAUSEA AND VOMITING THAT IS NOT CONTROLLED WITH YOUR NAUSEA MEDICATION  *UNUSUAL SHORTNESS OF BREATH  *UNUSUAL BRUISING OR BLEEDING  TENDERNESS IN MOUTH AND THROAT WITH OR WITHOUT PRESENCE OF ULCERS  *URINARY PROBLEMS  *BOWEL PROBLEMS  UNUSUAL RASH Items with * indicate a potential emergency and should be followed up as soon as possible.  Feel free to call the clinic should you have any questions or concerns. The clinic phone number is [phone].  Please show the CHEMO ALERT CARD at check-in to the Emergency Department and triage nurse.   

## 2017-10-13 NOTE — Assessment & Plan Note (Signed)
She has frequent dizziness and that could be related to changes in her blood pressure I recommend frequent blood pressure monitoring at home and adequate hydration in the event she has diarrhea

## 2017-10-13 NOTE — Assessment & Plan Note (Signed)
This is likely anemia of chronic disease. The patient denies recent history of bleeding such as epistaxis, hematuria or hematochezia. She is asymptomatic from the anemia. We will observe for now.  

## 2017-10-13 NOTE — Progress Notes (Signed)
[place] OFFICE PROGRESS NOTE  Patient Care Team: Josetta Huddle, MD as PCP - General (Internal Medicine) Ginette Pitman, MD as Consulting Physician (Hematology and Oncology) Hessie Dibble, MD as Consulting Physician (Hematology and Oncology)  ASSESSMENT & PLAN:  Multiple myeloma in remission [facility]) Her recent myeloma panel shows she remained in remission She will continue on Revlimid, 21 days on, 7 days off. She will continue acyclovir for antimicrobial prophylaxis. She will take aspirin for DVT prophylaxis. She will continue calcium with vitamin D supplements.   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 The patient denies recent history of bleeding such as epistaxis, hematuria or hematochezia. She is asymptomatic from the anemia. We will observe for now.   Diarrhea She has intermittent diarrhea of unknown etiology It could be related to dietary problem versus Revlimid We discussed conservative management with aggressive hydration and Imodium as needed  Dizziness She has frequent dizziness and that could be related to changes in her blood pressure I recommend frequent blood pressure monitoring at home and adequate hydration in the event she has diarrhea   No orders of the defined types were placed in this encounter.   INTERVAL HISTORY: Please see below for problem oriented charting. She returns with her husband for further follow-up She had frequent intermittent diarrhea without fevers, abdominal pain or nausea If she takes Imodium, she would get constipated She denies recent episode of dehydration No recent fever or chills She also complained of occasional dizziness but denies fall or new neurological deficit  SUMMARY OF ONCOLOGIC HISTORY: Oncology History    M-protein 0.69 gm/dl IFIX - IgG, Kappa IgG - 868 IgA - 19  IgM - < 20 Kappa - 21 Lambda - 5.7  09/06/2014 - Bone marrow aspirate and biopsy:   Normocellular marrow for age (40%) with a small monoclonal plasma cell population (1% on aspirate). Karyotype 31, XX  FISH Negative for myeloma associated changes  09/12/2014 - PET/CT  Two regions that are concerning for disease, one adjacent/involving the left ninth rib and one in the marrow of the right femur, in this patient with history of plasmacytoma.      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      09/06/2014 Bone Marrow Biopsy    BM performed at [facility] is not consistent with multiple myeloma, 1% plasma cell on aspirate      09/12/2014 Imaging     PET CT scan show involvement of left ninth rib and right femur      09/23/2014 - 10/23/2014 Radiation Therapy     she had radiation therapy to the cavernous sinus and skull base lesions, 45 Gy      10/21/2014 - 11/01/2014 Radiation Therapy     she had radiation to right femur , total 30 Gy      11/26/2014 - 02/14/2015 Chemotherapy     she is started on weekly dexamethasone, Velcade twice a week on  day 1, 4, 8 and 11 and Revlimid days 1-14.      04/01/2015 Bone Marrow Transplant    She received melphalan chemotherapy on 03/31/2015 followed by autologous stem cell transplant the day after      04/03/2015 - 04/18/2015 Hospital Admission    The patient was admitted to the hospital at [place] for management related to complication from stem cell transplant. She had significant nausea requiring intravenous anti-emetics.      07/17/2015 -  Chemotherapy    She started  maintenance Revlimid and monthly zometa       REVIEW OF SYSTEMS:   Constitutional: Denies fevers, chills or abnormal weight loss Eyes: Denies blurriness of vision Ears, nose, mouth, throat, and face: Denies mucositis or sore throat Respiratory: Denies cough, dyspnea or wheezes Cardiovascular: Denies palpitation, chest discomfort or lower extremity swelling Skin: Denies abnormal skin rashes Lymphatics: Denies new lymphadenopathy or easy bruising Neurological:Denies numbness, tingling or new weaknesses Behavioral/Psych: Mood is stable, no new changes  All other systems were reviewed with the patient and are negative.  I have reviewed the past medical history, past surgical history, social history and family history with the patient and they are unchanged from previous note.  ALLERGIES:  is allergic to augmentin [amoxicillin-pot clavulanate]; albuterol; codeine; ibuprofen; and nsaids.  MEDICATIONS:  Current Outpatient Medications  Medication Sig Dispense Refill  . acetaminophen (TYLENOL) 500 MG tablet Take 500-1,000 mg by mouth every 6 (six) hours as needed for mild pain, moderate pain, fever or headache.    Marland Kitchen acyclovir (ZOVIRAX) 400 MG tablet Take 1 tablet (400 mg total) by mouth 2 (two) times daily. 60 tablet 9  . aspirin EC 81 MG tablet Take 81 mg by mouth daily with breakfast.    . calcium carbonate (OSCAL) 1500 (600 Ca) MG TABS tablet Take 1,500 mg by mouth daily with breakfast.    . Cholecalciferol (VITAMIN D3) 2000 units capsule Take 2,000 Units by mouth daily.     Marland Kitchen lenalidomide (REVLIMID) 5 MG capsule Take 1 capsule (5 mg total) by mouth daily. 21 days on and 7 days off. 21 capsule 0  . Multiple Vitamins-Minerals (CENTRUM SILVER PO) Take 1 tablet by mouth daily.     Marland Kitchen omeprazole (PRILOSEC) 20 MG capsule Take 1 capsule (20 mg total) by mouth daily. 30 capsule 6  . ondansetron (ZOFRAN) 8 MG tablet TAKE 1 TABLET BY MOUTH EVERY 8 HOURS AS NEEDED FOR NAUSEA AND VOMITING 60 tablet 0   . senna-docusate (SENOKOT-S) 8.6-50 MG tablet Take 1-2 tablets by mouth daily as needed for mild constipation or moderate constipation.     . sodium chloride (OCEAN) 0.65 % SOLN nasal spray Place 1 spray into both nostrils as needed for congestion.     No current facility-administered medications for this visit.     PHYSICAL EXAMINATION: ECOG PERFORMANCE STATUS: 1 - Symptomatic but completely ambulatory  Vitals:   10/13/17 1355  BP: (!) 157/89  Pulse: 71  Resp: 18  Temp: 98.1 F (36.7 C)  SpO2: 100%   Filed Weights   10/13/17 1355  Weight: 121 lb 11.2 oz (55.2 kg)    GENERAL:alert, no distress and comfortable SKIN: skin color, texture, turgor are normal, no rashes or significant lesions EYES: normal, Conjunctiva are pink and non-injected, sclera clear OROPHARYNX:no exudate, no erythema and lips, buccal mucosa, and tongue normal  NECK: supple, thyroid normal size, non-tender, without nodularity LYMPH:  no palpable lymphadenopathy in the cervical, axillary or inguinal LUNGS: clear to auscultation  and percussion with normal breathing effort HEART: regular rate & rhythm and no murmurs and no lower extremity edema ABDOMEN:abdomen soft, non-tender and normal bowel sounds Musculoskeletal:no cyanosis of digits and no clubbing  NEURO: alert & oriented x 3 with fluent speech, no focal motor/sensory deficits  LABORATORY DATA:  I have reviewed the data as listed    Component Value Date/Time   NA 139 10/13/2017 1234   NA 143 07/14/2017 1245   K 3.8 10/13/2017 1234   K 3.3 (L) 07/14/2017 1245   CL 111 (H) 10/13/2017 1234   CO2 21 (L) 10/13/2017 1234   CO2 20 (L) 07/14/2017 1245   GLUCOSE 97 10/13/2017 1234   GLUCOSE 106 07/14/2017 1245   BUN 12 10/13/2017 1234   BUN 9.3 07/14/2017 1245   CREATININE 0.84 10/13/2017 1234   CREATININE 0.8 07/14/2017 1245   CALCIUM 8.9 10/13/2017 1234   CALCIUM 8.4 07/14/2017 1245   PROT 6.1 (L) 10/13/2017 1234   PROT 6.0 07/14/2017 1245    PROT 6.2 (L) 07/14/2017 1245   ALBUMIN 3.6 10/13/2017 1234   ALBUMIN 3.6 07/14/2017 1245   AST 27 10/13/2017 1234   AST 21 07/14/2017 1245   ALT 29 10/13/2017 1234   ALT 21 07/14/2017 1245   ALKPHOS 99 10/13/2017 1234   ALKPHOS 78 07/14/2017 1245   BILITOT 0.4 10/13/2017 1234   BILITOT 0.43 07/14/2017 1245   GFRNONAA >60 10/13/2017 1234   GFRAA >60 10/13/2017 1234    No results found for: SPEP, UPEP  Lab Results  Component Value Date   WBC 4.1 10/13/2017   NEUTROABS 1.8 10/13/2017   HGB 10.4 (L) 10/13/2017   HCT 32.4 (L) 10/13/2017   MCV 82.6 10/13/2017   PLT 234 10/13/2017      Chemistry      Component Value Date/Time   NA 139 10/13/2017 1234   NA 143 07/14/2017 1245   K 3.8 10/13/2017 1234   K 3.3 (L) 07/14/2017 1245   CL 111 (H) 10/13/2017 1234   CO2 21 (L) 10/13/2017 1234   CO2 20 (L) 07/14/2017 1245   BUN 12 10/13/2017 1234   BUN 9.3 07/14/2017 1245   CREATININE 0.84 10/13/2017 1234   CREATININE 0.8 07/14/2017 1245      Component Value Date/Time   CALCIUM 8.9 10/13/2017 1234   CALCIUM 8.4 07/14/2017 1245   ALKPHOS 99 10/13/2017 1234   ALKPHOS 78 07/14/2017 1245   AST 27 10/13/2017 1234   AST 21 07/14/2017 1245   ALT 29 10/13/2017 1234   ALT 21 07/14/2017 1245   BILITOT 0.4 10/13/2017 1234   BILITOT 0.43 07/14/2017 1245      All questions were answered. The patient knows to call the clinic with any problems, questions or concerns. No barriers to learning was detected.  I spent 15 minutes counseling the patient face to face. The total time spent in the appointment was 20 minutes and more than 50% was on counseling and review of test results  [name], MD 10/13/2017 5:05 PM

## 2017-10-14 LAB — KAPPA/LAMBDA LIGHT CHAINS
Kappa free light chain: 7.1 mg/L (ref 3.3–19.4)
Kappa, lambda light chain ratio: 0.89 (ref 0.26–1.65)
Lambda free light chains: 8 mg/L (ref 5.7–26.3)

## 2017-10-17 LAB — MULTIPLE MYELOMA PANEL, SERUM
Albumin SerPl Elph-Mcnc: 3.7 g/dL (ref 2.9–4.4)
Albumin/Glob SerPl: 1.6 (ref 0.7–1.7)
Alpha 1: 0.3 g/dL (ref 0.0–0.4)
Alpha2 Glob SerPl Elph-Mcnc: 0.8 g/dL (ref 0.4–1.0)
B-Globulin SerPl Elph-Mcnc: 1 g/dL (ref 0.7–1.3)
Gamma Glob SerPl Elph-Mcnc: 0.3 g/dL — ABNORMAL LOW (ref 0.4–1.8)
Globulin, Total: 2.4 g/dL (ref 2.2–3.9)
IgA: 54 mg/dL — ABNORMAL LOW (ref 87–352)
IgG (Immunoglobin G), Serum: 382 mg/dL — ABNORMAL LOW (ref 700–1600)
IgM (Immunoglobulin M), Srm: 8 mg/dL — ABNORMAL LOW (ref 26–217)
Total Protein ELP: 6.1 g/dL (ref 6.0–8.5)

## 2017-10-18 ENCOUNTER — Telehealth: Payer: Self-pay | Admitting: *Deleted

## 2017-10-18 NOTE — Telephone Encounter (Signed)
-----   Message from [name], MD sent at 10/18/2017  7:31 AM EDT ----- Regarding: myeloma panel is normal PLs let her know recent myeloma panel is normal ----- Message ----- From: Interface, Lab In [facility] Sent: 10/13/2017  12:52 PM To: [name], MD

## 2017-10-18 NOTE — Telephone Encounter (Signed)
Notified of message below

## 2017-10-27 ENCOUNTER — Other Ambulatory Visit: Payer: Self-pay | Admitting: Hematology and Oncology

## 2017-11-04 ENCOUNTER — Other Ambulatory Visit: Payer: Self-pay | Admitting: Hematology and Oncology

## 2017-11-08 ENCOUNTER — Other Ambulatory Visit: Payer: Self-pay

## 2017-11-08 MED ORDER — LENALIDOMIDE 5 MG PO CAPS: 5.0000 mg | ORAL_CAPSULE | Freq: Every day | ORAL | 0 refills | Status: DC

## 2017-11-14 ENCOUNTER — Encounter: Payer: Self-pay | Admitting: Hematology and Oncology

## 2017-11-14 NOTE — Progress Notes (Signed)
[employer] applied to the [employer] in behalf of the pt.  She was approved for $10,000 from 10/10/17 to 10/10/18 for Revlimid.

## 2017-11-25 ENCOUNTER — Other Ambulatory Visit: Payer: Self-pay | Admitting: Hematology and Oncology

## 2017-12-09 ENCOUNTER — Telehealth: Payer: Self-pay

## 2017-12-09 NOTE — Telephone Encounter (Signed)
Pt would like assistance in refilling her Revlimid - said needs by Thursday. Called pt back and she will be starting back on it Thursday May 9th, told her I will check with Tammi on Monday and we will get it ordered.  Pt agrees, no other needs per pt at this time.

## 2017-12-12 ENCOUNTER — Other Ambulatory Visit: Payer: Self-pay | Admitting: *Deleted

## 2017-12-12 MED ORDER — LENALIDOMIDE 5 MG PO CAPS: 5.0000 mg | ORAL_CAPSULE | Freq: Every day | ORAL | 0 refills | Status: DC

## 2017-12-27 ENCOUNTER — Other Ambulatory Visit: Payer: Self-pay | Admitting: Hematology and Oncology

## 2017-12-28 ENCOUNTER — Other Ambulatory Visit: Payer: Self-pay | Admitting: Hematology and Oncology

## 2018-01-05 ENCOUNTER — Other Ambulatory Visit: Payer: Self-pay

## 2018-01-05 MED ORDER — LENALIDOMIDE 5 MG PO CAPS: 5.0000 mg | ORAL_CAPSULE | Freq: Every day | ORAL | 0 refills | Status: DC

## 2018-01-12 ENCOUNTER — Telehealth: Payer: Self-pay | Admitting: Hematology and Oncology

## 2018-01-12 ENCOUNTER — Inpatient Hospital Stay: Payer: Medicare Other | Attending: Hematology and Oncology

## 2018-01-12 ENCOUNTER — Encounter: Payer: Self-pay | Admitting: Hematology and Oncology

## 2018-01-12 ENCOUNTER — Other Ambulatory Visit: Payer: Self-pay | Admitting: Hematology and Oncology

## 2018-01-12 ENCOUNTER — Inpatient Hospital Stay: Payer: Medicare Other

## 2018-01-12 ENCOUNTER — Inpatient Hospital Stay: Payer: Medicare Other | Admitting: Hematology and Oncology

## 2018-01-12 DIAGNOSIS — Z79899 Other long term (current) drug therapy: Secondary | ICD-10-CM | POA: Insufficient documentation

## 2018-01-12 DIAGNOSIS — Z9221 Personal history of antineoplastic chemotherapy: Secondary | ICD-10-CM

## 2018-01-12 DIAGNOSIS — R030 Elevated blood-pressure reading, without diagnosis of hypertension: Secondary | ICD-10-CM

## 2018-01-12 DIAGNOSIS — D500 Iron deficiency anemia secondary to blood loss (chronic): Secondary | ICD-10-CM | POA: Diagnosis not present

## 2018-01-12 DIAGNOSIS — C9001 Multiple myeloma in remission: Secondary | ICD-10-CM

## 2018-01-12 DIAGNOSIS — Z923 Personal history of irradiation: Secondary | ICD-10-CM | POA: Insufficient documentation

## 2018-01-12 DIAGNOSIS — Z7982 Long term (current) use of aspirin: Secondary | ICD-10-CM | POA: Diagnosis not present

## 2018-01-12 DIAGNOSIS — D6481 Anemia due to antineoplastic chemotherapy: Secondary | ICD-10-CM

## 2018-01-12 DIAGNOSIS — K219 Gastro-esophageal reflux disease without esophagitis: Secondary | ICD-10-CM | POA: Diagnosis not present

## 2018-01-12 DIAGNOSIS — Z9481 Bone marrow transplant status: Secondary | ICD-10-CM

## 2018-01-12 DIAGNOSIS — D638 Anemia in other chronic diseases classified elsewhere: Secondary | ICD-10-CM

## 2018-01-12 LAB — COMPREHENSIVE METABOLIC PANEL
ALT: 17 U/L (ref 0–55)
AST: 20 U/L (ref 5–34)
Albumin: 4 g/dL (ref 3.5–5.0)
Alkaline Phosphatase: 100 U/L (ref 40–150)
Anion gap: 9 (ref 3–11)
BUN: 13 mg/dL (ref 7–26)
CO2: 21 mmol/L — ABNORMAL LOW (ref 22–29)
Calcium: 9.1 mg/dL (ref 8.4–10.4)
Chloride: 110 mmol/L — ABNORMAL HIGH (ref 98–109)
Creatinine, Ser: 0.83 mg/dL (ref 0.60–1.10)
GFR calc Af Amer: >60 mL/min (ref >60)
GFR calc non Af Amer: >60 mL/min (ref >60)
Glucose, Bld: 93 mg/dL (ref 70–140)
Potassium: 4 mmol/L (ref 3.5–5.1)
Sodium: 140 mmol/L (ref 136–145)
Total Bilirubin: 0.4 mg/dL (ref 0.2–1.2)
Total Protein: 6.5 g/dL (ref 6.4–8.3)

## 2018-01-12 LAB — CBC WITH DIFFERENTIAL/PLATELET
Basophils Absolute: 0.1 10*3/uL (ref 0.0–0.1)
Basophils Relative: 3 %
Eosinophils Absolute: 0.1 10*3/uL (ref 0.0–0.5)
Eosinophils Relative: 2 %
HCT: 33.3 % — ABNORMAL LOW (ref 34.8–46.6)
Hemoglobin: 10.7 g/dL — ABNORMAL LOW (ref 11.6–15.9)
Lymphocytes Relative: 35 %
Lymphs Abs: 1.8 10*3/uL (ref 0.9–3.3)
MCH: 27.9 pg (ref 25.1–34.0)
MCHC: 32.1 g/dL (ref 31.5–36.0)
MCV: 86.7 fL (ref 79.5–101.0)
Monocytes Absolute: 0.6 10*3/uL (ref 0.1–0.9)
Monocytes Relative: 11 %
Neutro Abs: 2.6 10*3/uL (ref 1.5–6.5)
Neutrophils Relative %: 49 %
Platelets: 267 10*3/uL (ref 145–400)
RBC: 3.84 MIL/uL (ref 3.70–5.45)
RDW: 17.5 % — ABNORMAL HIGH (ref 11.2–14.5)
WBC: 5.2 10*3/uL (ref 3.9–10.3)

## 2018-01-12 MED ORDER — ZOLEDRONIC ACID 4 MG/5ML IV CONC
3.0000 mg | Freq: Once | INTRAVENOUS | Status: AC
  Administered 2018-01-12: 3 mg via INTRAVENOUS
  Filled 2018-01-12: qty 3.75

## 2018-01-12 MED ORDER — SODIUM CHLORIDE 0.9 % IV SOLN
Freq: Once | INTRAVENOUS | Status: AC
  Administered 2018-01-12: 14:00:00 via INTRAVENOUS

## 2018-01-12 NOTE — Patient Instructions (Addendum)

## 2018-01-12 NOTE — Assessment & Plan Note (Signed)
Her blood pressure is elevated but could be due to anxiety Her blood pressure documented in May was normal She complained of some postural dizziness at home I recommend close blood pressure monitoring at home and to report to her primary care doctor if her blood pressure remains persistently elevated at home

## 2018-01-12 NOTE — Assessment & Plan Note (Signed)
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

## 2018-01-12 NOTE — Assessment & Plan Note (Signed)
She has stable chronic reflux disease She will continue proton pump inhibitor

## 2018-01-12 NOTE — Telephone Encounter (Signed)
Gave avs and calendar ° °

## 2018-01-12 NOTE — Assessment & Plan Note (Signed)
Her recent myeloma panel shows she remained in remission She will continue on Revlimid, 21 days on, 7 days off. She will continue acyclovir for antimicrobial prophylaxis. She will take aspirin for DVT prophylaxis. She will continue calcium with vitamin D supplements.   We will continue Zometa every 3 months from now She will be due for DEXA scan at the end of the year We discussed the role of continuing maintenance Revlimid beyond 2 years post transplant She has an appointment to see her transplant physician in June I recommend for her to discuss with them further about the risk and benefits of continuing treatment Otherwise, I plan to see her back in 3 months for further follow-up

## 2018-01-12 NOTE — Progress Notes (Signed)
[place] OFFICE PROGRESS NOTE  Patient Care Team: Josetta Huddle, MD as PCP - General (Internal Medicine) Ginette Pitman, MD as Consulting Physician (Hematology and Oncology) Hessie Dibble, MD as Consulting Physician (Hematology and Oncology)  ASSESSMENT & PLAN:  Multiple myeloma in remission [facility]) Her recent myeloma panel shows she remained in remission She will continue on Revlimid, 21 days on, 7 days off. She will continue acyclovir for antimicrobial prophylaxis. She will take aspirin for DVT prophylaxis. She will continue calcium with vitamin D supplements.   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 She has multifactorial anemia, combination of anemia of chronic disease due to chemotherapy and mild iron deficiency We discussed iron rich diet rather than iron supplement due to history of severe nausea  Elevated blood pressure reading in office without diagnosis of hypertension Her blood pressure is elevated but could be due to anxiety Her blood pressure documented in May was normal She complained of some postural dizziness at home I recommend close blood pressure monitoring at home and to report to her primary care doctor if her blood pressure remains persistently elevated at home  Chronic GERD She has stable chronic reflux disease She will continue proton pump inhibitor   Orders Placed This Encounter  Procedures  . Comprehensive metabolic panel    Standing Status:   Future    Standing Expiration Date:   02/16/2019  . CBC with Differential/Platelet     Standing Status:   Future    Standing Expiration Date:   02/16/2019  . Kappa/lambda light chains    Standing Status:   Future    Standing Expiration Date:   02/16/2019  . Multiple Myeloma Panel (SPEP&IFE w/QIG)    Standing Status:   Future    Standing Expiration Date:   02/16/2019    INTERVAL HISTORY: Please see below for problem oriented charting. She returns for further follow-up with her husband She complained of persistent heartburn and occasional dizziness Denies recent infection No new bone pain Her appetite is stable and she has no recent weight change  SUMMARY OF ONCOLOGIC HISTORY: Oncology History    M-protein 0.69 gm/dl IFIX - IgG, Kappa IgG - 868 IgA - 19 IgM - < 20 Kappa - 21 Lambda - 5.7  09/06/2014 - Bone marrow aspirate and biopsy:   Normocellular marrow for age (40%) with a small monoclonal plasma cell population (1% on aspirate). Karyotype 35, XX  FISH Negative for myeloma associated changes  09/12/2014 - PET/CT  Two regions that are concerning for disease, one adjacent/involving the left ninth rib and one in the marrow of the right femur, in this patient with history of plasmacytoma.      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      09/06/2014 Bone Marrow Biopsy    BM performed at [facility] is not consistent with multiple myeloma, 1% plasma cell on aspirate      09/12/2014 Imaging     PET CT scan show involvement of left ninth rib and right femur      09/23/2014 - 10/23/2014 Radiation Therapy     she had radiation therapy to the cavernous sinus and skull base lesions, 45 Gy       10/21/2014 - 11/01/2014 Radiation Therapy     she had radiation to right femur , total 30 Gy      11/26/2014 - 02/14/2015 Chemotherapy     she is started on weekly dexamethasone, Velcade twice a week on day 1, 4, 8 and 11 and Revlimid days 1-14.      04/01/2015 Bone Marrow Transplant    She received melphalan chemotherapy on 03/31/2015 followed by autologous stem cell transplant the day after      04/03/2015 - 04/18/2015 Hospital Admission    The patient was admitted to the hospital at [place] for management related to complication from stem cell transplant. She had significant nausea requiring intravenous anti-emetics.      07/17/2015 -  Chemotherapy    She started maintenance Revlimid and monthly zometa       REVIEW OF SYSTEMS:   Constitutional: Denies fevers, chills or abnormal weight loss Eyes: Denies blurriness of vision Ears, nose, mouth, throat, and face: Denies mucositis or sore throat Respiratory: Denies cough, dyspnea or wheezes Cardiovascular: Denies palpitation, chest discomfort or lower extremity swelling Skin: Denies abnormal skin rashes Lymphatics: Denies new lymphadenopathy or easy bruising Neurological:Denies numbness, tingling or new weaknesses Behavioral/Psych: Mood is stable, no new changes  All other systems were reviewed with the patient and are negative.  I have reviewed the past medical history, past surgical history, social history and family history with the patient and they are unchanged from previous note.  ALLERGIES:  is allergic to augmentin [amoxicillin-pot clavulanate]; albuterol; codeine; ibuprofen; and nsaids.  MEDICATIONS:  Current Outpatient Medications  Medication Sig Dispense Refill  . acetaminophen (TYLENOL) 500 MG tablet Take 500-1,000 mg by mouth every 6 (six) hours as needed for mild pain, moderate pain, fever or headache.    Marland Kitchen acyclovir (ZOVIRAX) 400 MG tablet Take 1 tablet (400 mg total) by mouth 2 (two) times daily. 60 tablet 9   . aspirin EC 81 MG tablet Take 81 mg by mouth daily with breakfast.    . calcium carbonate (OSCAL) 1500 (600 Ca) MG TABS tablet Take 1,500 mg by mouth daily with breakfast.    . Cholecalciferol (VITAMIN D3) 2000 units capsule Take 2,000 Units by mouth daily.     Marland Kitchen lenalidomide (REVLIMID) 5 MG capsule Take 1 capsule (5 mg total) by mouth daily. 21 days on and 7 days off. 21 capsule 0  . Multiple Vitamins-Minerals (CENTRUM SILVER PO) Take 1 tablet by mouth daily.     Marland Kitchen omeprazole (PRILOSEC) 20 MG capsule TAKE 1 CAPSULE(20 MG) BY MOUTH DAILY 30 capsule 0  . ondansetron (ZOFRAN) 8 MG tablet TAKE 1 TABLET BY MOUTH EVERY 8 HOURS AS NEEDED FOR NAUSEA AND VOMITING 60 tablet 0  . senna-docusate (SENOKOT-S) 8.6-50 MG tablet Take 1-2 tablets by mouth daily as needed for mild constipation or moderate constipation.     . sodium chloride (OCEAN) 0.65 % SOLN nasal spray Place 1 spray into both nostrils  as needed for congestion.     No current facility-administered medications for this visit.     PHYSICAL EXAMINATION: ECOG PERFORMANCE STATUS: 1 - Symptomatic but completely ambulatory  Vitals:   01/12/18 1303  BP: (!) 155/92  Pulse: 71  Resp: 18  Temp: 98.2 F (36.8 C)  SpO2: 100%   Filed Weights   01/12/18 1303  Weight: 117 lb 8 oz (53.3 kg)    GENERAL:alert, no distress and comfortable SKIN: skin color, texture, turgor are normal, no rashes or significant lesions EYES: normal, Conjunctiva are pink and non-injected, sclera clear OROPHARYNX:no exudate, no erythema and lips, buccal mucosa, and tongue normal  NECK: supple, thyroid normal size, non-tender, without nodularity LYMPH:  no palpable lymphadenopathy in the cervical, axillary or inguinal LUNGS: clear to auscultation and percussion with normal breathing effort HEART: regular rate & rhythm and no murmurs and no lower extremity edema ABDOMEN:abdomen soft, non-tender and normal bowel sounds Musculoskeletal:no cyanosis of digits and no  clubbing  NEURO: alert & oriented x 3 with fluent speech, no focal motor/sensory deficits  LABORATORY DATA:  I have reviewed the data as listed    Component Value Date/Time   NA 140 01/12/2018 1238   NA 143 07/14/2017 1245   K 4.0 01/12/2018 1238   K 3.3 (L) 07/14/2017 1245   CL 110 (H) 01/12/2018 1238   CO2 21 (L) 01/12/2018 1238   CO2 20 (L) 07/14/2017 1245   GLUCOSE 93 01/12/2018 1238   GLUCOSE 106 07/14/2017 1245   BUN 13 01/12/2018 1238   BUN 9.3 07/14/2017 1245   CREATININE 0.83 01/12/2018 1238   CREATININE 0.8 07/14/2017 1245   CALCIUM 9.1 01/12/2018 1238   CALCIUM 8.4 07/14/2017 1245   PROT 6.5 01/12/2018 1238   PROT 6.0 07/14/2017 1245   PROT 6.2 (L) 07/14/2017 1245   ALBUMIN 4.0 01/12/2018 1238   ALBUMIN 3.6 07/14/2017 1245   AST 20 01/12/2018 1238   AST 21 07/14/2017 1245   ALT 17 01/12/2018 1238   ALT 21 07/14/2017 1245   ALKPHOS 100 01/12/2018 1238   ALKPHOS 78 07/14/2017 1245   BILITOT 0.4 01/12/2018 1238   BILITOT 0.43 07/14/2017 1245   GFRNONAA >60 01/12/2018 1238   GFRAA >60 01/12/2018 1238    No results found for: SPEP, UPEP  Lab Results  Component Value Date   WBC 5.2 01/12/2018   NEUTROABS 2.6 01/12/2018   HGB 10.7 (L) 01/12/2018   HCT 33.3 (L) 01/12/2018   MCV 86.7 01/12/2018   PLT 267 01/12/2018      Chemistry      Component Value Date/Time   NA 140 01/12/2018 1238   NA 143 07/14/2017 1245   K 4.0 01/12/2018 1238   K 3.3 (L) 07/14/2017 1245   CL 110 (H) 01/12/2018 1238   CO2 21 (L) 01/12/2018 1238   CO2 20 (L) 07/14/2017 1245   BUN 13 01/12/2018 1238   BUN 9.3 07/14/2017 1245   CREATININE 0.83 01/12/2018 1238   CREATININE 0.8 07/14/2017 1245      Component Value Date/Time   CALCIUM 9.1 01/12/2018 1238   CALCIUM 8.4 07/14/2017 1245   ALKPHOS 100 01/12/2018 1238   ALKPHOS 78 07/14/2017 1245   AST 20 01/12/2018 1238   AST 21 07/14/2017 1245   ALT 17 01/12/2018 1238   ALT 21 07/14/2017 1245   BILITOT 0.4 01/12/2018 1238    BILITOT 0.43 07/14/2017 1245       All questions were answered. The patient knows to  call the clinic with any problems, questions or concerns. No barriers to learning was detected.  I spent 15 minutes counseling the patient face to face. The total time spent in the appointment was 20 minutes and more than 50% was on counseling and review of test results  [name], MD 01/12/2018 2:00 PM

## 2018-01-13 LAB — KAPPA/LAMBDA LIGHT CHAINS
Kappa free light chain: 4.9 mg/L (ref 3.3–19.4)
Kappa, lambda light chain ratio: 0.8 (ref 0.26–1.65)
Lambda free light chains: 6.1 mg/L (ref 5.7–26.3)

## 2018-01-16 LAB — MULTIPLE MYELOMA PANEL, SERUM
Albumin SerPl Elph-Mcnc: 3.7 g/dL (ref 2.9–4.4)
Albumin/Glob SerPl: 1.7 (ref 0.7–1.7)
Alpha 1: 0.2 g/dL (ref 0.0–0.4)
Alpha2 Glob SerPl Elph-Mcnc: 0.7 g/dL (ref 0.4–1.0)
B-Globulin SerPl Elph-Mcnc: 1 g/dL (ref 0.7–1.3)
Gamma Glob SerPl Elph-Mcnc: 0.3 g/dL — ABNORMAL LOW (ref 0.4–1.8)
Globulin, Total: 2.2 g/dL (ref 2.2–3.9)
IgA: 50 mg/dL — ABNORMAL LOW (ref 87–352)
IgG (Immunoglobin G), Serum: 353 mg/dL — ABNORMAL LOW (ref 700–1600)
IgM (Immunoglobulin M), Srm: 6 mg/dL — ABNORMAL LOW (ref 26–217)
Total Protein ELP: 5.9 g/dL — ABNORMAL LOW (ref 6.0–8.5)

## 2018-01-18 ENCOUNTER — Other Ambulatory Visit: Payer: Self-pay | Admitting: Hematology and Oncology

## 2018-01-18 DIAGNOSIS — C9001 Multiple myeloma in remission: Secondary | ICD-10-CM

## 2018-01-18 DIAGNOSIS — E860 Dehydration: Secondary | ICD-10-CM

## 2018-01-24 ENCOUNTER — Other Ambulatory Visit: Payer: Self-pay | Admitting: Hematology and Oncology

## 2018-02-06 ENCOUNTER — Other Ambulatory Visit: Payer: Self-pay

## 2018-02-06 MED ORDER — LENALIDOMIDE 5 MG PO CAPS: 5.0000 mg | ORAL_CAPSULE | Freq: Every day | ORAL | 0 refills | Status: DC

## 2018-02-07 ENCOUNTER — Telehealth: Payer: Self-pay

## 2018-02-07 NOTE — Telephone Encounter (Signed)
She called and left a message. She called is insurance company to see if they would pay for a bone density that Dr. Alvy Bimler wanted to order.  Her last bone density was 05/31/16 and her insurance will pay for one every 2 years. If it is medically necessary you will have to contact the insurance company and get approval and use code : [id number].

## 2018-02-07 NOTE — Telephone Encounter (Signed)
We can wait until after October

## 2018-02-07 NOTE — Telephone Encounter (Signed)
Called and given below message. She verbalized understanding. 

## 2018-02-22 ENCOUNTER — Other Ambulatory Visit: Payer: Self-pay | Admitting: Hematology and Oncology

## 2018-03-02 ENCOUNTER — Other Ambulatory Visit: Payer: Self-pay

## 2018-03-02 MED ORDER — LENALIDOMIDE 5 MG PO CAPS: 5.0000 mg | ORAL_CAPSULE | Freq: Every day | ORAL | 0 refills | Status: DC

## 2018-03-04 ENCOUNTER — Other Ambulatory Visit: Payer: Self-pay | Admitting: Hematology and Oncology

## 2018-03-27 ENCOUNTER — Telehealth: Payer: Self-pay

## 2018-03-27 ENCOUNTER — Other Ambulatory Visit: Payer: Self-pay

## 2018-03-27 DIAGNOSIS — C9001 Multiple myeloma in remission: Secondary | ICD-10-CM

## 2018-03-27 DIAGNOSIS — E860 Dehydration: Secondary | ICD-10-CM

## 2018-03-27 MED ORDER — ACYCLOVIR 400 MG PO TABS
400.0000 mg | ORAL_TABLET | Freq: Two times a day (BID) | ORAL | 3 refills | Status: DC
Start: 2018-03-27 — End: 2020-08-12

## 2018-03-27 NOTE — Telephone Encounter (Signed)
She called and left a message to call her.  Called back. Her Acyclovir Rx she has been taking 2 instead of 1. The dosage changed and she did not pay attention to the bottle. She is now out. If she does not take the Rx her mouth will break out. She called the pharmacy and they said to call the office, it is to early to fill.

## 2018-03-27 NOTE — Telephone Encounter (Signed)
Please refill BID for 3 months with 3 refills

## 2018-04-03 ENCOUNTER — Other Ambulatory Visit: Payer: Self-pay | Admitting: *Deleted

## 2018-04-03 MED ORDER — LENALIDOMIDE 5 MG PO CAPS: 5.0000 mg | ORAL_CAPSULE | Freq: Every day | ORAL | 11 refills | Status: DC

## 2018-04-13 ENCOUNTER — Encounter: Payer: Self-pay | Admitting: Hematology and Oncology

## 2018-04-13 ENCOUNTER — Inpatient Hospital Stay: Payer: Medicare Other | Attending: Hematology and Oncology | Admitting: Hematology and Oncology

## 2018-04-13 ENCOUNTER — Inpatient Hospital Stay: Payer: Medicare Other

## 2018-04-13 ENCOUNTER — Telehealth: Payer: Self-pay | Admitting: Hematology and Oncology

## 2018-04-13 DIAGNOSIS — Z9484 Stem cells transplant status: Secondary | ICD-10-CM | POA: Diagnosis not present

## 2018-04-13 DIAGNOSIS — C9001 Multiple myeloma in remission: Secondary | ICD-10-CM

## 2018-04-13 DIAGNOSIS — Z7982 Long term (current) use of aspirin: Secondary | ICD-10-CM | POA: Diagnosis not present

## 2018-04-13 DIAGNOSIS — R634 Abnormal weight loss: Secondary | ICD-10-CM

## 2018-04-13 DIAGNOSIS — Z79899 Other long term (current) drug therapy: Secondary | ICD-10-CM | POA: Diagnosis not present

## 2018-04-13 DIAGNOSIS — Z9221 Personal history of antineoplastic chemotherapy: Secondary | ICD-10-CM | POA: Insufficient documentation

## 2018-04-13 DIAGNOSIS — D6481 Anemia due to antineoplastic chemotherapy: Secondary | ICD-10-CM

## 2018-04-13 DIAGNOSIS — Z923 Personal history of irradiation: Secondary | ICD-10-CM | POA: Diagnosis not present

## 2018-04-13 DIAGNOSIS — D638 Anemia in other chronic diseases classified elsewhere: Secondary | ICD-10-CM

## 2018-04-13 LAB — COMPREHENSIVE METABOLIC PANEL
ALT: 14 U/L (ref 0–44)
AST: 16 U/L (ref 15–41)
Albumin: 3.6 g/dL (ref 3.5–5.0)
Alkaline Phosphatase: 102 U/L (ref 38–126)
Anion gap: 8 (ref 5–15)
BUN: 9 mg/dL (ref 8–23)
CO2: 25 mmol/L (ref 22–32)
Calcium: 8.9 mg/dL (ref 8.9–10.3)
Chloride: 109 mmol/L (ref 98–111)
Creatinine, Ser: 0.82 mg/dL (ref 0.44–1.00)
GFR calc Af Amer: >60 mL/min (ref >60)
GFR calc non Af Amer: >60 mL/min (ref >60)
Glucose, Bld: 88 mg/dL (ref 70–99)
Potassium: 3.5 mmol/L (ref 3.5–5.1)
Sodium: 142 mmol/L (ref 135–145)
Total Bilirubin: 0.4 mg/dL (ref 0.3–1.2)
Total Protein: 6.4 g/dL — ABNORMAL LOW (ref 6.5–8.1)

## 2018-04-13 LAB — CBC WITH DIFFERENTIAL/PLATELET
Basophils Absolute: 0.1 10*3/uL (ref 0.0–0.1)
Basophils Relative: 2 %
Eosinophils Absolute: 0.4 10*3/uL (ref 0.0–0.5)
Eosinophils Relative: 7 %
HCT: 32.4 % — ABNORMAL LOW (ref 34.8–46.6)
Hemoglobin: 10.3 g/dL — ABNORMAL LOW (ref 11.6–15.9)
Lymphocytes Relative: 22 %
Lymphs Abs: 1.3 10*3/uL (ref 0.9–3.3)
MCH: 27.3 pg (ref 25.1–34.0)
MCHC: 31.8 g/dL (ref 31.5–36.0)
MCV: 85.9 fL (ref 79.5–101.0)
Monocytes Absolute: 0.2 10*3/uL (ref 0.1–0.9)
Monocytes Relative: 4 %
Neutro Abs: 3.9 10*3/uL (ref 1.5–6.5)
Neutrophils Relative %: 65 %
Platelets: 257 10*3/uL (ref 145–400)
RBC: 3.77 MIL/uL (ref 3.70–5.45)
RDW: 15.9 % — ABNORMAL HIGH (ref 11.2–14.5)
WBC: 5.9 10*3/uL (ref 3.9–10.3)

## 2018-04-13 NOTE — Telephone Encounter (Signed)
Gave pt avs and calendar  °

## 2018-04-14 ENCOUNTER — Telehealth: Payer: Self-pay

## 2018-04-14 ENCOUNTER — Encounter: Payer: Self-pay | Admitting: Hematology and Oncology

## 2018-04-14 DIAGNOSIS — R634 Abnormal weight loss: Secondary | ICD-10-CM | POA: Insufficient documentation

## 2018-04-14 LAB — MULTIPLE MYELOMA PANEL, SERUM
Albumin SerPl Elph-Mcnc: 3.2 g/dL (ref 2.9–4.4)
Albumin/Glob SerPl: 1.4 (ref 0.7–1.7)
Alpha 1: 0.3 g/dL (ref 0.0–0.4)
Alpha2 Glob SerPl Elph-Mcnc: 0.8 g/dL (ref 0.4–1.0)
B-Globulin SerPl Elph-Mcnc: 1 g/dL (ref 0.7–1.3)
Gamma Glob SerPl Elph-Mcnc: 0.3 g/dL — ABNORMAL LOW (ref 0.4–1.8)
Globulin, Total: 2.4 g/dL (ref 2.2–3.9)
IgA: 51 mg/dL — ABNORMAL LOW (ref 87–352)
IgG (Immunoglobin G), Serum: 319 mg/dL — ABNORMAL LOW (ref 700–1600)
IgM (Immunoglobulin M), Srm: 9 mg/dL — ABNORMAL LOW (ref 26–217)
Total Protein ELP: 5.6 g/dL — ABNORMAL LOW (ref 6.0–8.5)

## 2018-04-14 LAB — KAPPA/LAMBDA LIGHT CHAINS
Kappa free light chain: 5.2 mg/L (ref 3.3–19.4)
Kappa, lambda light chain ratio: 0.71 (ref 0.26–1.65)
Lambda free light chains: 7.3 mg/L (ref 5.7–26.3)

## 2018-04-14 NOTE — Assessment & Plan Note (Signed)
Her recent myeloma panel shows she remained in remission She will continue on Revlimid, 21 days on, 7 days off. She will continue acyclovir for antimicrobial prophylaxis. She will take aspirin for DVT prophylaxis. She will continue calcium with vitamin D supplements.   She has received numerous doses of Zometa.  I recommend putting it on hold She will be due for DEXA scan at the end of the year.  I will review the test results then and to determine whether she should go on reduced dose Zometa We discussed the role of continuing maintenance Revlimid beyond 2 years post transplant Otherwise, I plan to see her back in 3 months for further follow-up

## 2018-04-14 NOTE — Progress Notes (Signed)
[place] OFFICE PROGRESS NOTE  Patient Care Team: Josetta Huddle, MD as PCP - General (Internal Medicine) Ginette Pitman, MD as Consulting Physician (Hematology and Oncology) Hessie Dibble, MD as Consulting Physician (Hematology and Oncology)  ASSESSMENT & PLAN:  Multiple myeloma in remission [facility]) Her recent myeloma panel shows she remained in remission She will continue on Revlimid, 21 days on, 7 days off. She will continue acyclovir for antimicrobial prophylaxis. She will take aspirin for DVT prophylaxis. She will continue calcium with vitamin D supplements.   She has received numerous doses of Zometa.  I recommend putting it on hold She will be due for DEXA scan at the end of the year.  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  Observe  Weight loss She has chronic progressive weight loss secondary to sedentary lifestyle and lack of exercise Her appetite remains stable I recommend graduated exercise as tolerated.  She has lost a lot of muscle mass since she has started on treatment.   No orders of the defined types were placed in this encounter.   INTERVAL HISTORY: Please see below for problem oriented charting. She returns for further follow-up with her husband She tolerated Revlimid well without side effects such as rash, diarrhea or infection No new bone pain She does not walk much because of chronic fatigue and fear of contracting infection She also felt that her current weather is too hot  SUMMARY OF ONCOLOGIC HISTORY: Oncology History    M-protein 0.69 gm/dl IFIX - IgG, Kappa IgG - 868  IgA - 19 IgM - < 20 Kappa - 21 Lambda - 5.7  09/06/2014 - Bone marrow aspirate and biopsy:   Normocellular marrow for age (40%) with a small monoclonal plasma cell population (1% on aspirate). Karyotype 40, XX  FISH Negative for myeloma associated changes  09/12/2014 - PET/CT  Two regions that are concerning for disease, one adjacent/involving the left ninth rib and one in the marrow of the right femur, in this patient with history of plasmacytoma.      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    04/01/2015 Bone Marrow Transplant    She received melphalan chemotherapy on 03/31/2015 followed by autologous stem cell transplant the day after    04/03/2015 - 04/18/2015 Hospital Admission    The patient was admitted to the hospital at [place] for management related to complication from stem cell transplant. She had significant nausea requiring intravenous anti-emetics.    07/17/2015 -  Chemotherapy    She started maintenance Revlimid  and monthly zometa     REVIEW OF SYSTEMS:   Constitutional: Denies fevers, chills or abnormal weight loss Eyes: Denies blurriness of vision Ears, nose, mouth, throat, and face: Denies mucositis or sore throat Respiratory: Denies cough, dyspnea or wheezes Cardiovascular: Denies palpitation, chest discomfort or lower extremity swelling Gastrointestinal:  Denies nausea, heartburn or change in bowel habits Skin: Denies abnormal skin rashes Lymphatics: Denies new lymphadenopathy or easy bruising Neurological:Denies numbness, tingling or new weaknesses Behavioral/Psych: Mood is stable, no new changes  All other systems were reviewed with the patient and are negative.  I have reviewed the past medical history, past surgical history, social history and family history with the patient and they are unchanged from previous note.  ALLERGIES:  is allergic to augmentin [amoxicillin-pot clavulanate]; albuterol; codeine; ibuprofen; and nsaids.  MEDICATIONS:  Current Outpatient Medications  Medication Sig Dispense Refill  . acetaminophen (TYLENOL) 500 MG tablet Take 500-1,000 mg by mouth every 6 (six) hours as needed for mild pain, moderate pain, fever or headache.    Marland Kitchen acyclovir (ZOVIRAX) 400 MG tablet Take 1 tablet (400 mg total) by mouth 2 (two) times daily. 180 tablet 3  . aspirin EC 81 MG tablet Take 81 mg by mouth daily with breakfast.    . calcium carbonate (OSCAL) 1500 (600 Ca) MG TABS tablet Take 1,500 mg by mouth daily with breakfast.    . Cholecalciferol (VITAMIN D3) 2000 units capsule Take 2,000 Units by mouth daily.     Marland Kitchen lenalidomide (REVLIMID) 5 MG capsule Take 1 capsule (5 mg total) by mouth daily. 21 days on and 7 days off. 21 capsule 11  . Multiple Vitamins-Minerals (CENTRUM SILVER PO) Take 1 tablet by mouth daily.     Marland Kitchen omeprazole (PRILOSEC) 20 MG capsule TAKE 1 CAPSULE(20 MG) BY MOUTH DAILY 90 capsule 9  . ondansetron (ZOFRAN) 8 MG tablet TAKE 1 TABLET BY MOUTH EVERY 8 HOURS AS  NEEDED FOR NAUSEA AND VOMITING 60 tablet 0  . senna-docusate (SENOKOT-S) 8.6-50 MG tablet Take 1-2 tablets by mouth daily as needed for mild constipation or moderate constipation.     . sodium chloride (OCEAN) 0.65 % SOLN nasal spray Place 1 spray into both nostrils as needed for congestion.     No current facility-administered medications for this visit.     PHYSICAL EXAMINATION: ECOG PERFORMANCE STATUS: 1 - Symptomatic but completely ambulatory  Vitals:   04/13/18 1324  BP: (!) 146/79  Pulse: 76  Resp: 18  Temp: 98.3 F (36.8 C)  SpO2: 100%   Filed Weights   04/13/18 1324  Weight: 116 lb 3.2 oz (52.7 kg)    GENERAL:alert, no distress and comfortable.  She looks thinner than her prior exam SKIN: skin color, texture, turgor are normal, no rashes or significant lesions EYES: normal, Conjunctiva are pink and non-injected, sclera clear OROPHARYNX:no exudate, no erythema and lips, buccal mucosa, and tongue normal  NECK: supple, thyroid normal size, non-tender, without nodularity LYMPH:  no palpable  lymphadenopathy in the cervical, axillary or inguinal LUNGS: clear to auscultation and percussion with normal breathing effort HEART: regular rate & rhythm and no murmurs and no lower extremity edema ABDOMEN:abdomen soft, non-tender and normal bowel sounds Musculoskeletal:no cyanosis of digits and no clubbing  NEURO: alert & oriented x 3 with fluent speech, no focal motor/sensory deficits  LABORATORY DATA:  I have reviewed the data as listed    Component Value Date/Time   NA 142 04/13/2018 1307   NA 143 07/14/2017 1245   K 3.5 04/13/2018 1307   K 3.3 (L) 07/14/2017 1245   CL 109 04/13/2018 1307   CO2 25 04/13/2018 1307   CO2 20 (L) 07/14/2017 1245   GLUCOSE 88 04/13/2018 1307   GLUCOSE 106 07/14/2017 1245   BUN 9 04/13/2018 1307   BUN 9.3 07/14/2017 1245   CREATININE 0.82 04/13/2018 1307   CREATININE 0.8 07/14/2017 1245   CALCIUM 8.9 04/13/2018 1307   CALCIUM 8.4  07/14/2017 1245   PROT 6.4 (L) 04/13/2018 1307   PROT 6.0 07/14/2017 1245   PROT 6.2 (L) 07/14/2017 1245   ALBUMIN 3.6 04/13/2018 1307   ALBUMIN 3.6 07/14/2017 1245   AST 16 04/13/2018 1307   AST 21 07/14/2017 1245   ALT 14 04/13/2018 1307   ALT 21 07/14/2017 1245   ALKPHOS 102 04/13/2018 1307   ALKPHOS 78 07/14/2017 1245   BILITOT 0.4 04/13/2018 1307   BILITOT 0.43 07/14/2017 1245   GFRNONAA >60 04/13/2018 1307   GFRAA >60 04/13/2018 1307    No results found for: SPEP, UPEP  Lab Results  Component Value Date   WBC 5.9 04/13/2018   NEUTROABS 3.9 04/13/2018   HGB 10.3 (L) 04/13/2018   HCT 32.4 (L) 04/13/2018   MCV 85.9 04/13/2018   PLT 257 04/13/2018      Chemistry      Component Value Date/Time   NA 142 04/13/2018 1307   NA 143 07/14/2017 1245   K 3.5 04/13/2018 1307   K 3.3 (L) 07/14/2017 1245   CL 109 04/13/2018 1307   CO2 25 04/13/2018 1307   CO2 20 (L) 07/14/2017 1245   BUN 9 04/13/2018 1307   BUN 9.3 07/14/2017 1245   CREATININE 0.82 04/13/2018 1307   CREATININE 0.8 07/14/2017 1245      Component Value Date/Time   CALCIUM 8.9 04/13/2018 1307   CALCIUM 8.4 07/14/2017 1245   ALKPHOS 102 04/13/2018 1307   ALKPHOS 78 07/14/2017 1245   AST 16 04/13/2018 1307   AST 21 07/14/2017 1245   ALT 14 04/13/2018 1307   ALT 21 07/14/2017 1245   BILITOT 0.4 04/13/2018 1307   BILITOT 0.43 07/14/2017 1245       All questions were answered. The patient knows to call the clinic with any problems, questions or concerns. No barriers to learning was detected.  I spent 15 minutes counseling the patient face to face. The total time spent in the appointment was 20 minutes and more than 50% was on counseling and review of test results  [name], MD 04/14/2018 7:59 AM

## 2018-04-14 NOTE — Telephone Encounter (Signed)
-----   Message from [name], MD sent at 04/14/2018  3:38 PM EDT ----- Regarding: myeloma labs ok Tell her recent myeloma panel still good in remission ----- Message ----- From: Interface, Lab In [place] Sent: 04/13/2018   1:19 PM EDT To: [name], MD

## 2018-04-14 NOTE — Assessment & Plan Note (Signed)
She has chronic progressive weight loss secondary to sedentary lifestyle and lack of exercise Her appetite remains stable I recommend graduated exercise as tolerated.  She has lost a lot of muscle mass since she has started on treatment.

## 2018-04-14 NOTE — Telephone Encounter (Signed)
Spoke with patient to inform that myeloma panel is still good and in remission.  Patient happy to hear and voiced understanding.  Knows to call center if any issues arise.

## 2018-04-14 NOTE — Assessment & Plan Note (Signed)
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  Observe  

## 2018-04-26 ENCOUNTER — Other Ambulatory Visit: Payer: Self-pay | Admitting: Hematology and Oncology

## 2018-04-27 ENCOUNTER — Other Ambulatory Visit: Payer: Self-pay

## 2018-04-27 MED ORDER — LENALIDOMIDE 5 MG PO CAPS: 5.0000 mg | ORAL_CAPSULE | Freq: Every day | ORAL | 11 refills | Status: DC

## 2018-05-25 ENCOUNTER — Other Ambulatory Visit: Payer: Self-pay

## 2018-05-25 MED ORDER — LENALIDOMIDE 5 MG PO CAPS: 5.0000 mg | ORAL_CAPSULE | Freq: Every day | ORAL | 11 refills | Status: DC

## 2018-06-01 ENCOUNTER — Encounter: Payer: Self-pay | Admitting: Hematology and Oncology

## 2018-06-01 ENCOUNTER — Encounter: Payer: Self-pay | Admitting: Obstetrics and Gynecology

## 2018-06-05 ENCOUNTER — Telehealth: Payer: Self-pay | Admitting: Hematology and Oncology

## 2018-06-05 ENCOUNTER — Other Ambulatory Visit: Payer: Self-pay | Admitting: Hematology and Oncology

## 2018-06-05 DIAGNOSIS — C9001 Multiple myeloma in remission: Secondary | ICD-10-CM

## 2018-06-05 DIAGNOSIS — M8580 Other specified disorders of bone density and structure, unspecified site: Secondary | ICD-10-CM | POA: Insufficient documentation

## 2018-06-05 DIAGNOSIS — M85851 Other specified disorders of bone density and structure, right thigh: Secondary | ICD-10-CM

## 2018-06-05 NOTE — Telephone Encounter (Signed)
R/s appt per 10/28 sch message - pt is aware of appt date and time.   

## 2018-06-12 ENCOUNTER — Other Ambulatory Visit: Payer: Self-pay

## 2018-06-12 MED ORDER — LENALIDOMIDE 5 MG PO CAPS: 5.0000 mg | ORAL_CAPSULE | Freq: Every day | ORAL | 11 refills | Status: DC

## 2018-06-13 ENCOUNTER — Telehealth: Payer: Self-pay | Admitting: Obstetrics and Gynecology

## 2018-06-13 NOTE — Telephone Encounter (Signed)
Spoke with patient, advised as seen below per Dr. Quincy Simmonds. Patient thankful for call, verbalizes understanding and is agreeable.   Results to scan.   Encounter closed.

## 2018-06-13 NOTE — Telephone Encounter (Signed)
Please contact patient in follow up to her BMD study from [facility] dated 06/01/18.  She had hx multiple myeloma and osteopenia and is receiving Zometa through her oncologist at the [facility].  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  The left hip is improved.  I would prefer to have her oncologist ordering this in the future as they are prescribing her treatment.   The oncology team has scanned in the preliminary result.

## 2018-06-26 ENCOUNTER — Other Ambulatory Visit: Payer: Self-pay | Admitting: Hematology and Oncology

## 2018-07-05 ENCOUNTER — Inpatient Hospital Stay: Payer: Medicare Other | Attending: Hematology and Oncology

## 2018-07-05 DIAGNOSIS — M85851 Other specified disorders of bone density and structure, right thigh: Secondary | ICD-10-CM

## 2018-07-05 DIAGNOSIS — C9001 Multiple myeloma in remission: Secondary | ICD-10-CM | POA: Insufficient documentation

## 2018-07-05 LAB — COMPREHENSIVE METABOLIC PANEL
ALT: 14 U/L (ref 0–44)
AST: 18 U/L (ref 15–41)
Albumin: 3.9 g/dL (ref 3.5–5.0)
Alkaline Phosphatase: 83 U/L (ref 38–126)
Anion gap: 9 (ref 5–15)
BUN: 10 mg/dL (ref 8–23)
CO2: 25 mmol/L (ref 22–32)
Calcium: 9 mg/dL (ref 8.9–10.3)
Chloride: 110 mmol/L (ref 98–111)
Creatinine, Ser: 0.83 mg/dL (ref 0.44–1.00)
GFR calc Af Amer: >60 mL/min (ref >60)
GFR calc non Af Amer: >60 mL/min (ref >60)
Glucose, Bld: 90 mg/dL (ref 70–99)
Potassium: 3.6 mmol/L (ref 3.5–5.1)
Sodium: 144 mmol/L (ref 135–145)
Total Bilirubin: 0.4 mg/dL (ref 0.3–1.2)
Total Protein: 6.4 g/dL — ABNORMAL LOW (ref 6.5–8.1)

## 2018-07-05 LAB — CBC WITH DIFFERENTIAL/PLATELET
Abs Immature Granulocytes: 0.03 10*3/uL (ref 0.00–0.07)
Basophils Absolute: 0.1 10*3/uL (ref 0.0–0.1)
Basophils Relative: 3 %
Eosinophils Absolute: 0.3 10*3/uL (ref 0.0–0.5)
Eosinophils Relative: 6 %
HCT: 34.1 % — ABNORMAL LOW (ref 36.0–46.0)
Hemoglobin: 10.9 g/dL — ABNORMAL LOW (ref 12.0–15.0)
Immature Granulocytes: 1 %
Lymphocytes Relative: 30 %
Lymphs Abs: 1.3 10*3/uL (ref 0.7–4.0)
MCH: 27.8 pg (ref 26.0–34.0)
MCHC: 32 g/dL (ref 30.0–36.0)
MCV: 87 fL (ref 80.0–100.0)
Monocytes Absolute: 0.2 10*3/uL (ref 0.1–1.0)
Monocytes Relative: 4 %
Neutro Abs: 2.4 10*3/uL (ref 1.7–7.7)
Neutrophils Relative %: 56 %
Platelets: 239 10*3/uL (ref 150–400)
RBC: 3.92 MIL/uL (ref 3.87–5.11)
RDW: 16.2 % — ABNORMAL HIGH (ref 11.5–15.5)
WBC: 4.3 10*3/uL (ref 4.0–10.5)
nRBC: 0 % (ref 0.0–0.2)

## 2018-07-06 LAB — VITAMIN D 25 HYDROXY (VIT D DEFICIENCY, FRACTURES): Vit D, 25-Hydroxy: 39.2 ng/mL (ref 30.0–100.0)

## 2018-07-07 LAB — KAPPA/LAMBDA LIGHT CHAINS
Kappa free light chain: 7 mg/L (ref 3.3–19.4)
Kappa, lambda light chain ratio: 0.78 (ref 0.26–1.65)
Lambda free light chains: 9 mg/L (ref 5.7–26.3)

## 2018-07-11 LAB — MULTIPLE MYELOMA PANEL, SERUM
Albumin SerPl Elph-Mcnc: 3.9 g/dL (ref 2.9–4.4)
Albumin/Glob SerPl: 1.9 — ABNORMAL HIGH (ref 0.7–1.7)
Alpha 1: 0.2 g/dL (ref 0.0–0.4)
Alpha2 Glob SerPl Elph-Mcnc: 0.7 g/dL (ref 0.4–1.0)
B-Globulin SerPl Elph-Mcnc: 0.9 g/dL (ref 0.7–1.3)
Gamma Glob SerPl Elph-Mcnc: 0.3 g/dL — ABNORMAL LOW (ref 0.4–1.8)
Globulin, Total: 2.1 g/dL — ABNORMAL LOW (ref 2.2–3.9)
IgA: 58 mg/dL — ABNORMAL LOW (ref 87–352)
IgG (Immunoglobin G), Serum: 345 mg/dL — ABNORMAL LOW (ref 700–1600)
IgM (Immunoglobulin M), Srm: 6 mg/dL — ABNORMAL LOW (ref 26–217)
Total Protein ELP: 6 g/dL (ref 6.0–8.5)

## 2018-07-13 ENCOUNTER — Telehealth: Payer: Self-pay | Admitting: Hematology and Oncology

## 2018-07-13 ENCOUNTER — Inpatient Hospital Stay: Payer: Medicare Other

## 2018-07-13 ENCOUNTER — Other Ambulatory Visit: Payer: Medicare Other

## 2018-07-13 ENCOUNTER — Inpatient Hospital Stay: Payer: Medicare Other | Attending: Hematology and Oncology | Admitting: Hematology and Oncology

## 2018-07-13 ENCOUNTER — Encounter: Payer: Self-pay | Admitting: Hematology and Oncology

## 2018-07-13 DIAGNOSIS — Z9484 Stem cells transplant status: Secondary | ICD-10-CM

## 2018-07-13 DIAGNOSIS — C9001 Multiple myeloma in remission: Secondary | ICD-10-CM

## 2018-07-13 DIAGNOSIS — Z7982 Long term (current) use of aspirin: Secondary | ICD-10-CM | POA: Insufficient documentation

## 2018-07-13 DIAGNOSIS — Z9221 Personal history of antineoplastic chemotherapy: Secondary | ICD-10-CM | POA: Insufficient documentation

## 2018-07-13 DIAGNOSIS — Z923 Personal history of irradiation: Secondary | ICD-10-CM | POA: Diagnosis not present

## 2018-07-13 DIAGNOSIS — Z79899 Other long term (current) drug therapy: Secondary | ICD-10-CM | POA: Diagnosis not present

## 2018-07-13 DIAGNOSIS — D638 Anemia in other chronic diseases classified elsewhere: Secondary | ICD-10-CM

## 2018-07-13 DIAGNOSIS — Z9481 Bone marrow transplant status: Secondary | ICD-10-CM

## 2018-07-13 DIAGNOSIS — D6481 Anemia due to antineoplastic chemotherapy: Secondary | ICD-10-CM | POA: Diagnosis not present

## 2018-07-13 MED ORDER — ZOLEDRONIC ACID 4 MG/5ML IV CONC
3.0000 mg | Freq: Once | INTRAVENOUS | Status: AC
  Administered 2018-07-13: 3 mg via INTRAVENOUS
  Filled 2018-07-13: qty 3.75

## 2018-07-13 NOTE — Assessment & Plan Note (Signed)
Her recent myeloma panel shows she remained in remission She will continue on Revlimid, 21 days on, 7 days off. She will continue acyclovir for antimicrobial prophylaxis. She will take aspirin for DVT prophylaxis. She will continue calcium with vitamin D supplements.   She has received numerous doses of Zometa.  I recommend every 6 month treatment We discussed the role of continuing maintenance Revlimid beyond 2 years post transplant Otherwise, I plan to see her back in 3 months for further follow-up

## 2018-07-13 NOTE — Patient Instructions (Signed)

## 2018-07-13 NOTE — Progress Notes (Signed)
[place] OFFICE PROGRESS NOTE  Patient Care Team: Josetta Huddle, MD as PCP - General (Internal Medicine) Ginette Pitman, MD as Consulting Physician (Hematology and Oncology) Hessie Dibble, MD as Consulting Physician (Hematology and Oncology)  ASSESSMENT & PLAN:  Multiple myeloma in remission [facility]) Her recent myeloma panel shows she remained in remission She will continue on Revlimid, 21 days on, 7 days off. She will continue acyclovir for antimicrobial prophylaxis. She will take aspirin for DVT prophylaxis. She will continue calcium with vitamin D supplements.   She has received numerous doses of Zometa.  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  Observe   No orders of the defined types were placed in this encounter.   INTERVAL HISTORY: Please see below for problem oriented charting. She returns with her husband for further follow-up She feels well No recent infection, fever or chills No recent dental issues No worsening back pain or neuropathy.  She tolerated treatment well  SUMMARY OF ONCOLOGIC HISTORY: Oncology History    M-protein 0.69 gm/dl IFIX - IgG, Kappa IgG - 868 IgA - 19 IgM - < 20 Kappa - 21 Lambda - 5.7  09/06/2014 - Bone marrow aspirate and biopsy:   Normocellular marrow for age (40%) with a small monoclonal plasma cell population (1% on aspirate). Karyotype 67, XX  FISH Negative for myeloma associated changes  09/12/2014 - PET/CT  Two regions that are concerning for disease, one adjacent/involving the left ninth rib and one in the marrow of the right femur, in this patient with history of plasmacytoma.      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    04/01/2015 Bone Marrow Transplant    She received melphalan chemotherapy on 03/31/2015 followed by autologous stem cell transplant the day after    04/03/2015 - 04/18/2015 Hospital Admission    The patient was admitted to the hospital at [place] for management related to complication from stem cell transplant. She had significant nausea requiring intravenous anti-emetics.    07/17/2015 -  Chemotherapy    She started maintenance Revlimid and monthly  zometa, then every 3 months    06/01/2018 Imaging    DEXA scan showed bone density T score in femur -2.3     REVIEW OF SYSTEMS:   Constitutional: Denies fevers, chills or abnormal weight loss Eyes: Denies blurriness of vision Ears, nose, mouth, throat, and face: Denies mucositis or sore throat Respiratory: Denies cough, dyspnea or wheezes Cardiovascular: Denies palpitation, chest discomfort or lower extremity swelling Gastrointestinal:  Denies nausea, heartburn or  change in bowel habits Skin: Denies abnormal skin rashes Lymphatics: Denies new lymphadenopathy or easy bruising Neurological:Denies numbness, tingling or new weaknesses Behavioral/Psych: Mood is stable, no new changes  All other systems were reviewed with the patient and are negative.  I have reviewed the past medical history, past surgical history, social history and family history with the patient and they are unchanged from previous note.  ALLERGIES:  is allergic to augmentin [amoxicillin-pot clavulanate]; albuterol; codeine; ibuprofen; and nsaids.  MEDICATIONS:  Current Outpatient Medications  Medication Sig Dispense Refill  . acetaminophen (TYLENOL) 500 MG tablet Take 500-1,000 mg by mouth every 6 (six) hours as needed for mild pain, moderate pain, fever or headache.    Marland Kitchen acyclovir (ZOVIRAX) 400 MG tablet Take 1 tablet (400 mg total) by mouth 2 (two) times daily. 180 tablet 3  . aspirin EC 81 MG tablet Take 81 mg by mouth daily with breakfast.    . calcium carbonate (OSCAL) 1500 (600 Ca) MG TABS tablet Take 1,500 mg by mouth daily with breakfast.    . Cholecalciferol (VITAMIN D3) 2000 units capsule Take 2,000 Units by mouth daily.     Marland Kitchen lenalidomide (REVLIMID) 5 MG capsule Take 1 capsule (5 mg total) by mouth daily. 21 days on and 7 days off. 21 capsule 11  . Multiple Vitamins-Minerals (CENTRUM SILVER PO) Take 1 tablet by mouth daily.     Marland Kitchen omeprazole (PRILOSEC) 20 MG capsule TAKE 1 CAPSULE(20 MG) BY MOUTH DAILY 90 capsule 9  . ondansetron (ZOFRAN) 8 MG tablet TAKE 1 TABLET BY MOUTH EVERY 8 HOURS AS NEEDED FOR NAUSEA AND VOMITING 60 tablet 0  . senna-docusate (SENOKOT-S) 8.6-50 MG tablet Take 1-2 tablets by mouth daily as needed for mild constipation or moderate constipation.     . sodium chloride (OCEAN) 0.65 % SOLN nasal spray Place 1 spray into both nostrils as needed for congestion.     No current facility-administered medications for this visit.     PHYSICAL EXAMINATION: ECOG  PERFORMANCE STATUS: 1 - Symptomatic but completely ambulatory  Vitals:   07/13/18 1258  BP: (!) 141/89  Pulse: 71  Resp: 18  Temp: 98.3 F (36.8 C)  SpO2: 100%   Filed Weights   07/13/18 1258  Weight: 116 lb 9.6 oz (52.9 kg)    GENERAL:alert, no distress and comfortable SKIN: skin color, texture, turgor are normal, no rashes or significant lesions EYES: normal, Conjunctiva are pink and non-injected, sclera clear OROPHARYNX:no exudate, no erythema and lips, buccal mucosa, and tongue normal  NECK: supple, thyroid normal size, non-tender, without nodularity LYMPH:  no palpable lymphadenopathy in the cervical, axillary or inguinal LUNGS: clear to auscultation and percussion with normal breathing effort HEART: regular rate & rhythm and no murmurs and no lower extremity edema ABDOMEN:abdomen soft, non-tender and normal bowel sounds Musculoskeletal:no cyanosis of digits and no clubbing  NEURO: alert & oriented x 3 with fluent speech, no focal motor/sensory deficits  LABORATORY DATA:  I have reviewed the data as listed    Component Value Date/Time  NA 144 07/05/2018 1140   NA 143 07/14/2017 1245   K 3.6 07/05/2018 1140   K 3.3 (L) 07/14/2017 1245   CL 110 07/05/2018 1140   CO2 25 07/05/2018 1140   CO2 20 (L) 07/14/2017 1245   GLUCOSE 90 07/05/2018 1140   GLUCOSE 106 07/14/2017 1245   BUN 10 07/05/2018 1140   BUN 9.3 07/14/2017 1245   CREATININE 0.83 07/05/2018 1140   CREATININE 0.8 07/14/2017 1245   CALCIUM 9.0 07/05/2018 1140   CALCIUM 8.4 07/14/2017 1245   PROT 6.4 (L) 07/05/2018 1140   PROT 6.0 07/14/2017 1245   PROT 6.2 (L) 07/14/2017 1245   ALBUMIN 3.9 07/05/2018 1140   ALBUMIN 3.6 07/14/2017 1245   AST 18 07/05/2018 1140   AST 21 07/14/2017 1245   ALT 14 07/05/2018 1140   ALT 21 07/14/2017 1245   ALKPHOS 83 07/05/2018 1140   ALKPHOS 78 07/14/2017 1245   BILITOT 0.4 07/05/2018 1140   BILITOT 0.43 07/14/2017 1245   GFRNONAA >60 07/05/2018 1140   GFRAA >60  07/05/2018 1140    No results found for: SPEP, UPEP  Lab Results  Component Value Date   WBC 4.3 07/05/2018   NEUTROABS 2.4 07/05/2018   HGB 10.9 (L) 07/05/2018   HCT 34.1 (L) 07/05/2018   MCV 87.0 07/05/2018   PLT 239 07/05/2018      Chemistry      Component Value Date/Time   NA 144 07/05/2018 1140   NA 143 07/14/2017 1245   K 3.6 07/05/2018 1140   K 3.3 (L) 07/14/2017 1245   CL 110 07/05/2018 1140   CO2 25 07/05/2018 1140   CO2 20 (L) 07/14/2017 1245   BUN 10 07/05/2018 1140   BUN 9.3 07/14/2017 1245   CREATININE 0.83 07/05/2018 1140   CREATININE 0.8 07/14/2017 1245      Component Value Date/Time   CALCIUM 9.0 07/05/2018 1140   CALCIUM 8.4 07/14/2017 1245   ALKPHOS 83 07/05/2018 1140   ALKPHOS 78 07/14/2017 1245   AST 18 07/05/2018 1140   AST 21 07/14/2017 1245   ALT 14 07/05/2018 1140   ALT 21 07/14/2017 1245   BILITOT 0.4 07/05/2018 1140   BILITOT 0.43 07/14/2017 1245     All questions were answered. The patient knows to call the clinic with any problems, questions or concerns. No barriers to learning was detected.  I spent 10 minutes counseling the patient face to face. The total time spent in the appointment was 15 minutes and more than 50% was on counseling and review of test results  [name], MD 07/13/2018 1:33 PM

## 2018-07-13 NOTE — Telephone Encounter (Signed)
Gave patient avs report and appointments for February and March.  °

## 2018-07-13 NOTE — Assessment & Plan Note (Signed)
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  Observe  

## 2018-07-20 ENCOUNTER — Other Ambulatory Visit: Payer: Self-pay

## 2018-07-20 MED ORDER — LENALIDOMIDE 5 MG PO CAPS: 5.0000 mg | ORAL_CAPSULE | Freq: Every day | ORAL | 11 refills | Status: DC

## 2018-08-16 ENCOUNTER — Encounter: Payer: Self-pay | Admitting: Obstetrics and Gynecology

## 2018-08-18 ENCOUNTER — Other Ambulatory Visit: Payer: Self-pay | Admitting: *Deleted

## 2018-08-18 MED ORDER — LENALIDOMIDE 5 MG PO CAPS: 5.0000 mg | ORAL_CAPSULE | Freq: Every day | ORAL | 11 refills | Status: DC

## 2018-09-01 ENCOUNTER — Other Ambulatory Visit: Payer: Self-pay | Admitting: Hematology and Oncology

## 2018-09-15 ENCOUNTER — Other Ambulatory Visit: Payer: Self-pay | Admitting: *Deleted

## 2018-09-15 MED ORDER — LENALIDOMIDE 5 MG PO CAPS: 5.0000 mg | ORAL_CAPSULE | Freq: Every day | ORAL | 11 refills | Status: DC

## 2018-09-29 ENCOUNTER — Other Ambulatory Visit: Payer: Self-pay

## 2018-09-29 DIAGNOSIS — C9001 Multiple myeloma in remission: Secondary | ICD-10-CM

## 2018-10-03 ENCOUNTER — Inpatient Hospital Stay: Payer: Medicare Other | Attending: Hematology and Oncology

## 2018-10-03 DIAGNOSIS — C9001 Multiple myeloma in remission: Secondary | ICD-10-CM | POA: Diagnosis not present

## 2018-10-03 LAB — CMP (CANCER CENTER ONLY)
ALT: 16 U/L (ref 0–44)
AST: 19 U/L (ref 15–41)
Albumin: 3.9 g/dL (ref 3.5–5.0)
Alkaline Phosphatase: 81 U/L (ref 38–126)
Anion gap: 10 (ref 5–15)
BUN: 8 mg/dL (ref 8–23)
CO2: 22 mmol/L (ref 22–32)
Calcium: 8.4 mg/dL — ABNORMAL LOW (ref 8.9–10.3)
Chloride: 110 mmol/L (ref 98–111)
Creatinine: 0.8 mg/dL (ref 0.44–1.00)
GFR, Est AFR Am: >60 mL/min (ref >60)
GFR, Estimated: >60 mL/min (ref >60)
Glucose, Bld: 92 mg/dL (ref 70–99)
Potassium: 3.5 mmol/L (ref 3.5–5.1)
Sodium: 142 mmol/L (ref 135–145)
Total Bilirubin: 0.6 mg/dL (ref 0.3–1.2)
Total Protein: 6.5 g/dL (ref 6.5–8.1)

## 2018-10-03 LAB — CBC WITH DIFFERENTIAL (CANCER CENTER ONLY)
Abs Immature Granulocytes: 0.02 10*3/uL (ref 0.00–0.07)
Basophils Absolute: 0.1 10*3/uL (ref 0.0–0.1)
Basophils Relative: 1 %
Eosinophils Absolute: 0.5 10*3/uL (ref 0.0–0.5)
Eosinophils Relative: 7 %
HCT: 35.5 % — ABNORMAL LOW (ref 36.0–46.0)
Hemoglobin: 11.3 g/dL — ABNORMAL LOW (ref 12.0–15.0)
Immature Granulocytes: 0 %
Lymphocytes Relative: 23 %
Lymphs Abs: 1.5 10*3/uL (ref 0.7–4.0)
MCH: 28.3 pg (ref 26.0–34.0)
MCHC: 31.8 g/dL (ref 30.0–36.0)
MCV: 88.8 fL (ref 80.0–100.0)
Monocytes Absolute: 0.5 10*3/uL (ref 0.1–1.0)
Monocytes Relative: 8 %
Neutro Abs: 3.9 10*3/uL (ref 1.7–7.7)
Neutrophils Relative %: 61 %
Platelet Count: 207 10*3/uL (ref 150–400)
RBC: 4 MIL/uL (ref 3.87–5.11)
RDW: 15.1 % (ref 11.5–15.5)
WBC Count: 6.4 10*3/uL (ref 4.0–10.5)
nRBC: 0 % (ref 0.0–0.2)

## 2018-10-04 LAB — KAPPA/LAMBDA LIGHT CHAINS
Kappa free light chain: 9 mg/L (ref 3.3–19.4)
Kappa, lambda light chain ratio: 1.22 (ref 0.26–1.65)
Lambda free light chains: 7.4 mg/L (ref 5.7–26.3)

## 2018-10-05 ENCOUNTER — Emergency Department (HOSPITAL_COMMUNITY): Payer: Medicare Other

## 2018-10-05 ENCOUNTER — Encounter (HOSPITAL_COMMUNITY): Payer: Self-pay | Admitting: Emergency Medicine

## 2018-10-05 ENCOUNTER — Other Ambulatory Visit: Payer: Self-pay

## 2018-10-05 ENCOUNTER — Emergency Department (HOSPITAL_COMMUNITY)
Admission: EM | Admit: 2018-10-05 | Discharge: 2018-10-05 | Disposition: A | Discharge status: Home / Self Care | Payer: Medicare Other | Attending: Emergency Medicine | Admitting: Emergency Medicine

## 2018-10-05 DIAGNOSIS — Z7982 Long term (current) use of aspirin: Secondary | ICD-10-CM | POA: Diagnosis not present

## 2018-10-05 DIAGNOSIS — Z79899 Other long term (current) drug therapy: Secondary | ICD-10-CM | POA: Insufficient documentation

## 2018-10-05 DIAGNOSIS — R197 Diarrhea, unspecified: Secondary | ICD-10-CM | POA: Diagnosis not present

## 2018-10-05 DIAGNOSIS — R109 Unspecified abdominal pain: Secondary | ICD-10-CM | POA: Insufficient documentation

## 2018-10-05 DIAGNOSIS — R0789 Other chest pain: Secondary | ICD-10-CM

## 2018-10-05 DIAGNOSIS — R110 Nausea: Secondary | ICD-10-CM | POA: Insufficient documentation

## 2018-10-05 DIAGNOSIS — K529 Noninfective gastroenteritis and colitis, unspecified: Secondary | ICD-10-CM

## 2018-10-05 DIAGNOSIS — R1084 Generalized abdominal pain: Secondary | ICD-10-CM

## 2018-10-05 LAB — CBC
HCT: 38.7 % (ref 36.0–46.0)
Hemoglobin: 11.9 g/dL — ABNORMAL LOW (ref 12.0–15.0)
MCH: 27.9 pg (ref 26.0–34.0)
MCHC: 30.7 g/dL (ref 30.0–36.0)
MCV: 90.8 fL (ref 80.0–100.0)
Platelets: 206 10*3/uL (ref 150–400)
RBC: 4.26 MIL/uL (ref 3.87–5.11)
RDW: 15 % (ref 11.5–15.5)
WBC: 5.4 10*3/uL (ref 4.0–10.5)
nRBC: 0 % (ref 0.0–0.2)

## 2018-10-05 LAB — COMPREHENSIVE METABOLIC PANEL
ALT: 18 U/L (ref 0–44)
AST: 23 U/L (ref 15–41)
Albumin: 4.3 g/dL (ref 3.5–5.0)
Alkaline Phosphatase: 73 U/L (ref 38–126)
Anion gap: 8 (ref 5–15)
BUN: 10 mg/dL (ref 8–23)
CO2: 23 mmol/L (ref 22–32)
Calcium: 8.5 mg/dL — ABNORMAL LOW (ref 8.9–10.3)
Chloride: 108 mmol/L (ref 98–111)
Creatinine, Ser: 0.75 mg/dL (ref 0.44–1.00)
GFR calc Af Amer: >60 mL/min (ref >60)
GFR calc non Af Amer: >60 mL/min (ref >60)
Glucose, Bld: 87 mg/dL (ref 70–99)
Potassium: 3.2 mmol/L — ABNORMAL LOW (ref 3.5–5.1)
Sodium: 139 mmol/L (ref 135–145)
Total Bilirubin: 0.6 mg/dL (ref 0.3–1.2)
Total Protein: 6.6 g/dL (ref 6.5–8.1)

## 2018-10-05 LAB — I-STAT TROPONIN, ED: Troponin i, poc: 0.01 ng/mL (ref 0.00–0.08)

## 2018-10-05 LAB — LIPASE, BLOOD: Lipase: 33 U/L (ref 11–51)

## 2018-10-05 MED ORDER — HYDROCODONE-ACETAMINOPHEN 5-325 MG PO TABS: 1.0000 | ORAL_TABLET | Freq: Four times a day (QID) | ORAL | 0 refills | Status: DC | PRN

## 2018-10-05 MED ORDER — HYDROCODONE-ACETAMINOPHEN 5-325 MG PO TABS
1.0000 | ORAL_TABLET | Freq: Once | ORAL | Status: AC
  Administered 2018-10-05: 1 via ORAL
  Filled 2018-10-05: qty 1

## 2018-10-05 MED ORDER — ONDANSETRON 4 MG PO TBDP: 4.0000 mg | ORAL_TABLET | Freq: Three times a day (TID) | ORAL | 1 refills | Status: DC | PRN

## 2018-10-05 MED ORDER — SODIUM CHLORIDE 0.9 % IV SOLN: INTRAVENOUS | Status: DC

## 2018-10-05 MED ORDER — ONDANSETRON HCL 4 MG/2ML IJ SOLN
4.0000 mg | Freq: Once | INTRAMUSCULAR | Status: AC
  Administered 2018-10-05: 4 mg via INTRAVENOUS
  Filled 2018-10-05: qty 2

## 2018-10-05 MED ORDER — IOPAMIDOL (ISOVUE-370) INJECTION 76%
100.0000 mL | Freq: Once | INTRAVENOUS | Status: AC | PRN
  Administered 2018-10-05: 100 mL via INTRAVENOUS

## 2018-10-05 MED ORDER — SODIUM CHLORIDE (PF) 0.9 % IJ SOLN
INTRAMUSCULAR | Status: AC
  Filled 2018-10-05: qty 100

## 2018-10-05 MED ORDER — IOPAMIDOL (ISOVUE-370) INJECTION 76%
INTRAVENOUS | Status: AC
  Filled 2018-10-05: qty 100

## 2018-10-05 NOTE — ED Notes (Signed)
ED Provider at bedside. 

## 2018-10-05 NOTE — Discharge Instructions (Signed)
Work-up here tonight this showed some mild rectal inflammation called colitis.  Take the hydrocodone as directed take the antinausea medicine to help with any nausea from the hydrocodone.  Make an appointment to follow-up with your regular doctor and keep your appointment as scheduled for next week with your hematologist oncologist.  Return for any new or worse symptoms.  No evidence of an acute cardiac event today.  No evidence of any blood clots in the lungs.

## 2018-10-05 NOTE — ED Triage Notes (Addendum)
Pt c/o central chest pains that radiates to neck and back that started yesterday. N/v/d started today. Pt took Zofran at home today with no relief.

## 2018-10-05 NOTE — ED Provider Notes (Signed)
[facility] DEPT Provider Note   CSN: [id number] Arrival date & time: 10/05/18  1708    History   Chief Complaint Chief Complaint  Patient presents with  . Chest Pain  . Back Pain  . Diarrhea    HPI Jessica ABASCAL is a 68 y.o. female.     Patient with a history of myeloma.  Followed by hematology oncology here.  Primary care doctor is Dr. Inda Merlin.  Patient's had chest pain midsternal area to the left-sided chest into the neck and arm is been on going for 2 weeks.  Does come and go.  Also diffuse abdominal pain that started today.  Associated with nausea some diarrhea no blood in the diarrhea no fevers no vomiting.     Past Medical History:  Diagnosis Date  . H/O stem cell transplant [facility]) 03/2015   [facility]  . Multiple myeloma ([place]) 11/19/2014  . Multiple myeloma ([place])   . Multiple myeloma ([place])   . MVP (mitral valve prolapse)    req prophylaxis  . Other constipation 12/03/2014  . Right ovarian cyst 10/16/15   16 mm simple cyst - ultrasound yearly.  [facility] Kitchen Upper respiratory infection, acute 08/12/2015    Patient Active Problem List   Diagnosis Date Noted  . Osteopenia 06/05/2018  . Weight loss 04/14/2018  . Elevated blood pressure reading in office without diagnosis of hypertension 01/12/2018  . Diarrhea 10/13/2017  . Dizziness 10/13/2017  . Right ovarian cyst 05/13/2017  . Chronic GERD 01/13/2017  . 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 08/10/2016  . Hypokalemia 07/16/2016  . Pneumonia 07/13/2016  . Atypical chest pain 07/13/2016  . Chest pain on breathing 07/12/2016  . Mucositis due to antineoplastic therapy 07/09/2016  . Candidal esophagitis ([place]) 04/30/2016  . Herpes simplex 04/30/2016  . Chronic kidney disease, stage III (moderate) ([place]) 03/12/2016  . Hypokalemia, gastrointestinal losses 01/16/2016  . Pancytopenia, acquired ([place]) 10/20/2015  . Nasal congestion 10/20/2015  . Varicose vein of leg 08/25/2015  . S/P bone marrow  transplant ([place]) 04/22/2015  . Hypotension due to drugs 04/22/2015  . Petechiae 02/14/2015  . 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 Essential hypertension 02/07/2015  . Infection of eyelid 01/16/2015  . Protein calorie malnutrition ([place]) 01/16/2015  . Superficial thrombophlebitis of upper extremity 12/24/2014  . Sty, external 12/24/2014  . Other constipation 12/03/2014  . Dehydration 12/02/2014  . Weakness 12/02/2014  . Multiple myeloma in remission ([place]) 11/19/2014  . Prerenal renal failure 11/19/2014  . 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[place]) 11/19/2014  . Neuropathic pain of left flank 11/19/2014    Past Surgical History:  Procedure Laterality Date  . CATARACT EXTRACTION, BILATERAL  10/2010   Implants(ReSTOR) Bilat  . LASER ABLATION OF CONDYLOMAS  1990   CIN 1 cervix  . multiple myeloma surgery Right 08/2014   [facility]     OB History    Gravida  1   Para  1   Term      Preterm      AB      Living  1     SAB      TAB      Ectopic      Multiple      Live Births               Home Medications    Prior to Admission medications   Medication Sig Start Date End Date Taking? Authorizing Provider  acetaminophen (TYLENOL) 500  MG tablet Take 500-1,000 mg by mouth every 6 (six) hours as needed for mild pain, moderate pain, fever or headache.   Yes [provider]  acyclovir (ZOVIRAX) 400 MG tablet Take 1 tablet (400 mg total) by mouth 2 (two) times daily. 03/27/18  Yes [name], MD  aspirin EC 81 MG tablet Take 81 mg by mouth daily with breakfast.   Yes [provider]  calcium carbonate (OSCAL) 1500 (600 Ca) MG TABS tablet Take 1,500 mg by mouth daily with breakfast.   Yes [provider]  Calcium Carbonate 500 MG CHEW Chew 500 mg by mouth as needed for indigestion.   Yes [provider]  Cholecalciferol (VITAMIN D3) 2000 units capsule Take 2,000 Units by mouth daily.     Yes [provider]  lenalidomide (REVLIMID) 5 MG capsule Take 1 capsule (5 mg total) by mouth daily. 21 days on and 7 days off. 09/15/18  Yes Gorsuch, Ni, MD  Multiple Vitamins-Minerals (CENTRUM SILVER PO) Take 1 tablet by mouth daily.    Yes [provider]  omeprazole (PRILOSEC) 20 MG capsule TAKE 1 CAPSULE(20 MG) BY MOUTH DAILY Patient taking differently: Take 20 mg by mouth daily.  02/22/18  Yes Gorsuch, Ni, MD  ondansetron (ZOFRAN) 8 MG tablet TAKE 1 TABLET BY MOUTH EVERY 8 HOURS AS NEEDED FOR NAUSEA OR VOMITING Patient taking differently: Take 8 mg by mouth every 8 (eight) hours as needed for nausea or vomiting.  09/01/18  Yes Gorsuch, Ni, MD  senna-docusate (SENOKOT-S) 8.6-50 MG tablet Take 1-2 tablets by mouth daily as needed for mild constipation or moderate constipation.  08/20/14  Yes [provider]  sodium chloride (OCEAN) 0.65 % SOLN nasal spray Place 1 spray into both nostrils as needed for congestion.   Yes [provider]  HYDROcodone-acetaminophen (NORCO/VICODIN) 5-325 MG tablet Take 1 tablet by mouth every 6 (six) hours as needed for moderate pain. 10/05/18   Fredia Sorrow, MD  ondansetron (ZOFRAN ODT) 4 MG disintegrating tablet Take 1 tablet (4 mg total) by mouth every 8 (eight) hours as needed for nausea or vomiting. 10/05/18   Fredia Sorrow, MD    Family History Family History  Problem Relation Age of Onset  . Osteoporosis Mother   . Diabetes Father     Social History Social History   Tobacco Use  . Smoking status: Never Smoker  . Smokeless tobacco: Never Used  Substance Use Topics  . Alcohol use: No    Alcohol/week: 0.0 standard drinks  . Drug use: No     Allergies   Augmentin [amoxicillin-pot clavulanate]; Albuterol; Codeine; Ibuprofen; Nsaids; and Tolmetin   Review of Systems Review of Systems  Constitutional: Negative for chills and fever.  HENT: Negative for congestion, rhinorrhea and sore throat.   Eyes:  Negative for visual disturbance.  Respiratory: Negative for cough and shortness of breath.   Cardiovascular: Positive for chest pain. Negative for leg swelling.  Gastrointestinal: Positive for abdominal pain. Negative for diarrhea, nausea and vomiting.  Genitourinary: Negative for dysuria.  Musculoskeletal: Positive for neck pain. Negative for back pain.  Skin: Negative for rash.  Neurological: Negative for dizziness, syncope, light-headedness and headaches.  Hematological: Does not bruise/bleed easily.  Psychiatric/Behavioral: Negative for confusion.     Physical Exam Updated Vital Signs BP (!) 155/101   Pulse 87   Temp 98.4 F (36.9 C) (Oral)   Resp 17   LMP 08/10/2003   SpO2 100%   Physical Exam Vitals signs and nursing note  reviewed.  Constitutional:      General: She is not in acute distress.    Appearance: She is well-developed.  HENT:     Head: Normocephalic and atraumatic.     Nose: No congestion.     Mouth/Throat:     Mouth: Mucous membranes are moist.  Eyes:     Extraocular Movements: Extraocular movements intact.     Conjunctiva/sclera: Conjunctivae normal.     Pupils: Pupils are equal, round, and reactive to light.  Neck:     Musculoskeletal: Normal range of motion and neck supple.  Cardiovascular:     Rate and Rhythm: Normal rate and regular rhythm.     Heart sounds: Normal heart sounds. No murmur.  Pulmonary:     Effort: Pulmonary effort is normal. No respiratory distress.     Breath sounds: Normal breath sounds.  Abdominal:     General: Bowel sounds are normal.     Palpations: Abdomen is soft.     Tenderness: There is no abdominal tenderness.  Musculoskeletal: Normal range of motion.        General: No swelling.  Skin:    General: Skin is warm and dry.     Capillary Refill: Capillary refill takes less than 2 seconds.  Neurological:     General: No focal deficit present.     Mental Status: She is alert and oriented to person, place, and time.       ED Treatments / Results  Labs (all labs ordered are listed, but only abnormal results are displayed) Labs Reviewed  CBC - Abnormal; Notable for the following components:      Result Value   Hemoglobin 11.9 (*)    All other components within normal limits  COMPREHENSIVE METABOLIC PANEL - Abnormal; Notable for the following components:   Potassium 3.2 (*)    Calcium 8.5 (*)    All other components within normal limits  LIPASE, BLOOD  I-STAT TROPONIN, ED    EKG EKG Interpretation  Date/Time:  Thursday October 05 2018 17:15:42 EST Ventricular Rate:  88 PR Interval:    QRS Duration: 88 QT Interval:  375 QTC Calculation: 454 R Axis:   -42 Text Interpretation:  Sinus rhythm Left axis deviation Abnormal R-wave progression, early transition Borderline abnrm T, anterolateral leads Baseline wander in lead(s) V5 V6 No previous ECGs available Confirmed by Fredia Sorrow [phone]) on 10/05/2018 6:51:54 PM   Radiology Dg Chest 2 View  Result Date: 10/05/2018 CLINICAL DATA:  68 y/o  F; chest pain. EXAM: CHEST - 2 VIEW COMPARISON:  07/12/2016 chest radiograph FINDINGS: Stable heart size and mediastinal contours are within normal limits. Both lungs are clear. The visualized skeletal structures are unremarkable. IMPRESSION: No acute pulmonary process identified. Electronically Signed   By: Kristine Garbe M.D.   On: 10/05/2018 18:10   Ct Angio Chest Pe W/cm &/or Wo Cm  Result Date: 10/05/2018 CLINICAL DATA:  Chest pain radiating to the neck and back. Nausea, vomiting and diarrhea. EXAM: CT ANGIOGRAPHY CHEST CT ABDOMEN AND PELVIS WITH CONTRAST TECHNIQUE: Multidetector CT imaging of the chest was performed using the standard protocol during bolus administration of intravenous contrast. Multiplanar CT image reconstructions and MIPs were obtained to evaluate the vascular anatomy. Multidetector CT imaging of the abdomen and pelvis was performed using the standard protocol during bolus  administration of intravenous contrast. CONTRAST:  139m ISOVUE-370 IOPAMIDOL (ISOVUE-370) INJECTION 76% COMPARISON:  Chest CT 07/13/2016 FINDINGS: CTA CHEST FINDINGS Cardiovascular: Contrast injection is sufficient to demonstrate satisfactory opacification  of the pulmonary arteries to the segmental level. There is no pulmonary embolus. The main pulmonary artery is within normal limits for size. There is no CT evidence of acute right heart strain. The visualized aorta is normal. There is a normal 3-vessel arch branching pattern. Heart size is normal, without pericardial effusion. Mediastinum/Nodes: No mediastinal, hilar or axillary lymphadenopathy. The visualized thyroid and thoracic esophageal course are unremarkable. Lungs/Pleura: No pulmonary nodules or masses. No pleural effusion or pneumothorax. No focal airspace consolidation. No focal pleural abnormality. Musculoskeletal: No chest wall abnormality. No acute or significant osseous findings. Review of the MIP images confirms the above findings. CT ABDOMEN and PELVIS FINDINGS Hepatobiliary: Normal hepatic contours and density. No visible biliary dilatation. Normal gallbladder. Pancreas: Normal contours without ductal dilatation. No peripancreatic fluid collection. Spleen: Normal. Adrenals/Urinary Tract: --Adrenal glands: Normal. --Right kidney/ureter: No hydronephrosis or perinephric stranding. No nephrolithiasis. No obstructing ureteral stones. --Left kidney/ureter: No hydronephrosis or perinephric stranding. No nephrolithiasis. No obstructing ureteral stones. --Urinary bladder: Unremarkable. Stomach/Bowel: --Stomach/Duodenum: No hiatal hernia or other gastric abnormality. Normal duodenal course and caliber. --Small bowel: No dilatation or inflammation. --Colon: There is mild stranding surrounding the rectum. The remainder of the colon is normal. --Appendix: Normal. Vascular/Lymphatic: Normal course and caliber of the major abdominal vessels. No abdominal or  pelvic lymphadenopathy. Reproductive: Normal uterus and ovaries. Musculoskeletal. No bony spinal canal stenosis or focal osseous abnormality. Other: None. IMPRESSION: 1. No pulmonary embolus or acute aortic syndrome. 2. Mild inflammatory stranding surrounding the rectum possibly indicating colitis. Electronically Signed   By: Ulyses Jarred M.D.   On: 10/05/2018 20:10   Ct Abdomen Pelvis W Contrast  Result Date: 10/05/2018 CLINICAL DATA:  Chest pain radiating to the neck and back. Nausea, vomiting and diarrhea. EXAM: CT ANGIOGRAPHY CHEST CT ABDOMEN AND PELVIS WITH CONTRAST TECHNIQUE: Multidetector CT imaging of the chest was performed using the standard protocol during bolus administration of intravenous contrast. Multiplanar CT image reconstructions and MIPs were obtained to evaluate the vascular anatomy. Multidetector CT imaging of the abdomen and pelvis was performed using the standard protocol during bolus administration of intravenous contrast. CONTRAST:  128m ISOVUE-370 IOPAMIDOL (ISOVUE-370) INJECTION 76% COMPARISON:  Chest CT 07/13/2016 FINDINGS: CTA CHEST FINDINGS Cardiovascular: Contrast injection is sufficient to demonstrate satisfactory opacification of the pulmonary arteries to the segmental level. There is no pulmonary embolus. The main pulmonary artery is within normal limits for size. There is no CT evidence of acute right heart strain. The visualized aorta is normal. There is a normal 3-vessel arch branching pattern. Heart size is normal, without pericardial effusion. Mediastinum/Nodes: No mediastinal, hilar or axillary lymphadenopathy. The visualized thyroid and thoracic esophageal course are unremarkable. Lungs/Pleura: No pulmonary nodules or masses. No pleural effusion or pneumothorax. No focal airspace consolidation. No focal pleural abnormality. Musculoskeletal: No chest wall abnormality. No acute or significant osseous findings. Review of the MIP images confirms the above findings. CT  ABDOMEN and PELVIS FINDINGS Hepatobiliary: Normal hepatic contours and density. No visible biliary dilatation. Normal gallbladder. Pancreas: Normal contours without ductal dilatation. No peripancreatic fluid collection. Spleen: Normal. Adrenals/Urinary Tract: --Adrenal glands: Normal. --Right kidney/ureter: No hydronephrosis or perinephric stranding. No nephrolithiasis. No obstructing ureteral stones. --Left kidney/ureter: No hydronephrosis or perinephric stranding. No nephrolithiasis. No obstructing ureteral stones. --Urinary bladder: Unremarkable. Stomach/Bowel: --Stomach/Duodenum: No hiatal hernia or other gastric abnormality. Normal duodenal course and caliber. --Small bowel: No dilatation or inflammation. --Colon: There is mild stranding surrounding the rectum. The remainder of the colon is normal. --Appendix: Normal. Vascular/Lymphatic: Normal course  and caliber of the major abdominal vessels. No abdominal or pelvic lymphadenopathy. Reproductive: Normal uterus and ovaries. Musculoskeletal. No bony spinal canal stenosis or focal osseous abnormality. Other: None. IMPRESSION: 1. No pulmonary embolus or acute aortic syndrome. 2. Mild inflammatory stranding surrounding the rectum possibly indicating colitis. Electronically Signed   By: Ulyses Jarred M.D.   On: 10/05/2018 20:10    Procedures Procedures (including critical care time)  Medications Ordered in ED Medications  0.9 %  sodium chloride infusion (has no administration in time range)  sodium chloride (PF) 0.9 % injection (has no administration in time range)  iopamidol (ISOVUE-370) 76 % injection (has no administration in time range)  iopamidol (ISOVUE-370) 76 % injection 100 mL (100 mLs Intravenous Contrast Given 10/05/18 1943)  ondansetron (ZOFRAN) injection 4 mg (4 mg Intravenous Given 10/05/18 2037)  HYDROcodone-acetaminophen (NORCO/VICODIN) 5-325 MG per tablet 1 tablet (1 tablet Oral Given 10/05/18 2037)     Initial Impression / Assessment  and Plan / ED Course  I have reviewed the triage vital signs and the nursing notes.  Pertinent labs & imaging results that were available during my care of the patient were reviewed by me and considered in my medical decision making (see chart for details).        Work-up for the chest pain and abdominal pain and now the diarrhea included's CT angios the chest and CT of abdomen without evidence of pulmonary embolism no evidence of any acute abdominal process other than some rectal inflammation consistent with colitis which may explain some of the diarrhea but probably does not suspect explain the diffuse abdominal pain.  Certainly no explanation for the chest pain.  Patient's troponin was negative and since his pain is been ongoing for 2 weeks that is very reassuring.  In addition patient's regular chest x-ray was negative EKG without any acute changes.  Patient treated here with hydrocodone she has side effect problems with nausea from it also given some Zofran.  She will be treated with this at home she has follow-up with her hematology doctor next week and also recommend she follow-up with Dr. Inda Merlin by setting up an appointment in the next week or 2.  Additional work-up will be required for the chest pain.  But does not seem to be any acute cardiac event no evidence of pulmonary embolism.  Hemoglobin slightly low but not in transfusion range.  Lecture lites with some mild hypokalemia with a potassium of 3.2.  Final Clinical Impressions(s) / ED Diagnoses   Final diagnoses:  Other chest pain  Generalized abdominal pain  Colitis    ED Discharge Orders         Ordered    HYDROcodone-acetaminophen (NORCO/VICODIN) 5-325 MG tablet  Every 6 hours PRN     10/05/18 2050    ondansetron (ZOFRAN ODT) 4 MG disintegrating tablet  Every 8 hours PRN     10/05/18 2050           Fredia Sorrow, MD 10/05/18 2055

## 2018-10-06 LAB — MULTIPLE MYELOMA PANEL, SERUM
Albumin SerPl Elph-Mcnc: 3.6 g/dL (ref 2.9–4.4)
Albumin/Glob SerPl: 1.6 (ref 0.7–1.7)
Alpha 1: 0.3 g/dL (ref 0.0–0.4)
Alpha2 Glob SerPl Elph-Mcnc: 0.7 g/dL (ref 0.4–1.0)
B-Globulin SerPl Elph-Mcnc: 1 g/dL (ref 0.7–1.3)
Gamma Glob SerPl Elph-Mcnc: 0.4 g/dL (ref 0.4–1.8)
Globulin, Total: 2.3 g/dL (ref 2.2–3.9)
IgA: 60 mg/dL — ABNORMAL LOW (ref 87–352)
IgG (Immunoglobin G), Serum: 394 mg/dL — ABNORMAL LOW (ref 700–1600)
IgM (Immunoglobulin M), Srm: 7 mg/dL — ABNORMAL LOW (ref 26–217)
M Protein SerPl Elph-Mcnc: 0.1 g/dL — ABNORMAL HIGH
Total Protein ELP: 5.9 g/dL — ABNORMAL LOW (ref 6.0–8.5)

## 2018-10-12 ENCOUNTER — Encounter: Payer: Self-pay | Admitting: Hematology and Oncology

## 2018-10-12 ENCOUNTER — Other Ambulatory Visit: Payer: Self-pay

## 2018-10-12 ENCOUNTER — Telehealth: Payer: Self-pay | Admitting: Hematology and Oncology

## 2018-10-12 ENCOUNTER — Inpatient Hospital Stay: Payer: Medicare Other | Attending: Hematology and Oncology | Admitting: Hematology and Oncology

## 2018-10-12 VITALS — BP 135/78 | HR 62 | Temp 97.9°F | Resp 18 | Ht 60.25 in | Wt 115.8 lb

## 2018-10-12 DIAGNOSIS — K219 Gastro-esophageal reflux disease without esophagitis: Secondary | ICD-10-CM | POA: Insufficient documentation

## 2018-10-12 DIAGNOSIS — Z923 Personal history of irradiation: Secondary | ICD-10-CM

## 2018-10-12 DIAGNOSIS — R0789 Other chest pain: Secondary | ICD-10-CM | POA: Insufficient documentation

## 2018-10-12 DIAGNOSIS — N183 Chronic kidney disease, stage 3 unspecified: Secondary | ICD-10-CM

## 2018-10-12 DIAGNOSIS — D638 Anemia in other chronic diseases classified elsewhere: Secondary | ICD-10-CM | POA: Diagnosis not present

## 2018-10-12 DIAGNOSIS — D6481 Anemia due to antineoplastic chemotherapy: Secondary | ICD-10-CM | POA: Insufficient documentation

## 2018-10-12 DIAGNOSIS — Z9484 Stem cells transplant status: Secondary | ICD-10-CM | POA: Diagnosis not present

## 2018-10-12 DIAGNOSIS — Z7982 Long term (current) use of aspirin: Secondary | ICD-10-CM | POA: Diagnosis not present

## 2018-10-12 DIAGNOSIS — C9001 Multiple myeloma in remission: Secondary | ICD-10-CM | POA: Diagnosis not present

## 2018-10-12 DIAGNOSIS — Z79899 Other long term (current) drug therapy: Secondary | ICD-10-CM

## 2018-10-12 DIAGNOSIS — Z9221 Personal history of antineoplastic chemotherapy: Secondary | ICD-10-CM | POA: Diagnosis not present

## 2018-10-12 MED ORDER — LENALIDOMIDE 5 MG PO CAPS: 5.0000 mg | ORAL_CAPSULE | Freq: Every day | ORAL | 11 refills | Status: DC

## 2018-10-12 NOTE — Assessment & Plan Note (Signed)
Her recent myeloma panel showed detectable M spike but her IgG level and free light chain levels are low/within normal limits She will continue on Revlimid, 21 days on, 7 days off. She will continue acyclovir for antimicrobial prophylaxis. She will take aspirin for DVT prophylaxis. She will continue calcium with vitamin D supplements.   She has received numerous doses of Zometa.  From a previous visit, we have plan to change her to every 6 months treatment.  She is reminded to continue close follow-up with a dentist We discussed the role of continuing maintenance Revlimid beyond 2 years post transplant and she agreed to continue Otherwise, I plan to see her back in 3 months for further follow-up

## 2018-10-12 NOTE — Progress Notes (Signed)
[facility] OFFICE PROGRESS NOTE  Patient Care Team: Josetta Huddle, MD as PCP - General (Internal Medicine) Ginette Pitman, MD as Consulting Physician (Hematology and Oncology) Hessie Dibble, MD as Consulting Physician (Hematology and Oncology)  ASSESSMENT & PLAN:  Multiple myeloma in remission [facility]) Her recent myeloma panel showed detectable M spike but her IgG level and free light chain levels are low/within normal limits She will continue on Revlimid, 21 days on, 7 days off. She will continue acyclovir for antimicrobial prophylaxis. She will take aspirin for DVT prophylaxis. She will continue calcium with vitamin D supplements.   She has received numerous doses of Zometa.  From a previous visit, we have plan to change her to every 6 months treatment.  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  Observe  Atypical chest pain She has recent extensive evaluation for atypical chest pain Her symptoms has resolved CT imaging showed mild colitis changes but she is currently not symptomatic Observe only   Orders Placed This Encounter  Procedures  . CBC with Differential/Platelet    Standing Status:   Future    Standing Expiration Date:   11/16/2019  . Comprehensive metabolic panel    Standing Status:   Future    Standing Expiration Date:   11/16/2019  . Kappa/lambda light chains    Standing Status:   Future    Standing Expiration Date:   11/16/2019  . Multiple Myeloma Panel (SPEP&IFE w/QIG)    Standing Status:   Future    Standing Expiration Date:   11/16/2019    INTERVAL HISTORY: Please see below for problem oriented  charting. She returns with her husband for further follow-up She was seen in the ER recently for atypical chest pain She also underwent extensive CT imaging study which show possible colitis change.  She had recent abdominal discomfort but that has since resolved She denies recent infection, fever or chills  SUMMARY OF ONCOLOGIC HISTORY: Oncology History    M-protein 0.69 gm/dl IFIX - IgG, Kappa IgG - 868 IgA - 19 IgM - < 20 Kappa - 21 Lambda - 5.7  09/06/2014 - Bone marrow aspirate and biopsy:   Normocellular marrow for age (40%) with a small monoclonal plasma cell population (1% on aspirate). Karyotype 10, XX  FISH Negative for myeloma associated changes  09/12/2014 - PET/CT  Two regions that are concerning for disease, one adjacent/involving the left ninth rib and one in the marrow of the right femur, in this patient with history of plasmacytoma.      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    04/01/2015 Bone Marrow Transplant     She received melphalan chemotherapy on 03/31/2015 followed by autologous stem cell transplant the day after    04/03/2015 - 04/18/2015 Hospital Admission    The patient was admitted to the hospital at [place] for management related to complication from stem cell transplant. She had significant nausea requiring intravenous anti-emetics.    07/17/2015 -  Chemotherapy    She started maintenance Revlimid and monthly zometa, then every 3 months    06/01/2018 Imaging    DEXA scan showed bone density T score in femur -2.3     REVIEW OF SYSTEMS:   Constitutional: Denies fevers, chills or abnormal weight loss Eyes: Denies blurriness of vision Ears, nose, mouth, throat, and face: Denies mucositis or sore throat Respiratory: Denies cough, dyspnea or wheezes Cardiovascular: Denies palpitation, chest discomfort or lower extremity swelling Gastrointestinal:  Denies nausea, heartburn or change in bowel habits Skin: Denies abnormal skin rashes Lymphatics: Denies new lymphadenopathy or easy bruising Neurological:Denies numbness, tingling or new weaknesses Behavioral/Psych: Mood is stable, no new changes  All other systems were reviewed with the patient and are negative.  I have reviewed the past medical history, past surgical history, social history and family history with the patient and they are unchanged from previous note.  ALLERGIES:  is allergic to augmentin [amoxicillin-pot clavulanate]; albuterol; codeine; ibuprofen; nsaids; and tolmetin.  MEDICATIONS:  Current Outpatient Medications  Medication Sig Dispense Refill  . acetaminophen (TYLENOL) 500 MG tablet Take 500-1,000 mg by mouth every 6 (six) hours as needed for mild pain, moderate pain, fever or headache.    Marland Kitchen acyclovir (ZOVIRAX) 400 MG tablet Take 1 tablet (400 mg total) by mouth 2 (two) times daily. 180 tablet 3  . aspirin EC 81 MG tablet Take 81 mg by mouth daily with breakfast.    . calcium carbonate (OSCAL) 1500 (600 Ca) MG  TABS tablet Take 1,500 mg by mouth daily with breakfast.    . Calcium Carbonate 500 MG CHEW Chew 500 mg by mouth as needed for indigestion.    . Cholecalciferol (VITAMIN D3) 2000 units capsule Take 2,000 Units by mouth daily.     Marland Kitchen HYDROcodone-acetaminophen (NORCO/VICODIN) 5-325 MG tablet Take 1 tablet by mouth every 6 (six) hours as needed for moderate pain. 10 tablet 0  . lenalidomide (REVLIMID) 5 MG capsule Take 1 capsule (5 mg total) by mouth daily. 21 days on and 7 days off. 21 capsule 11  . Multiple Vitamins-Minerals (CENTRUM SILVER PO) Take 1 tablet by mouth daily.     Marland Kitchen omeprazole (PRILOSEC) 20 MG capsule TAKE 1 CAPSULE(20 MG) BY MOUTH DAILY (Patient taking differently: Take 20 mg by mouth daily. ) 90 capsule 9  . ondansetron (ZOFRAN ODT) 4 MG disintegrating tablet Take 1 tablet (4 mg total) by mouth every 8 (eight) hours as needed for nausea or vomiting. 12 tablet 1  . ondansetron (ZOFRAN) 8 MG tablet TAKE 1 TABLET BY MOUTH  EVERY 8 HOURS AS NEEDED FOR NAUSEA OR VOMITING (Patient taking differently: Take 8 mg by mouth every 8 (eight) hours as needed for nausea or vomiting. ) 60 tablet 1  . senna-docusate (SENOKOT-S) 8.6-50 MG tablet Take 1-2 tablets by mouth daily as needed for mild constipation or moderate constipation.     . sodium chloride (OCEAN) 0.65 % SOLN nasal spray Place 1 spray into both nostrils as needed for congestion.     No current facility-administered medications for this visit.     PHYSICAL EXAMINATION: ECOG PERFORMANCE STATUS: 1 - Symptomatic but completely ambulatory  Vitals:   10/12/18 1327  BP: 135/78  Pulse: 62  Resp: 18  Temp: 97.9 F (36.6 C)  SpO2: 100%   Filed Weights   10/12/18 1327  Weight: 115 lb 12.8 oz (52.5 kg)    GENERAL:alert, no distress and comfortable SKIN: skin color, texture, turgor are normal, no rashes or significant lesions EYES: normal, Conjunctiva are pink and non-injected, sclera clear OROPHARYNX:no exudate, no erythema and  lips, buccal mucosa, and tongue normal  NECK: supple, thyroid normal size, non-tender, without nodularity LYMPH:  no palpable lymphadenopathy in the cervical, axillary or inguinal LUNGS: clear to auscultation and percussion with normal breathing effort HEART: regular rate & rhythm and no murmurs and no lower extremity edema ABDOMEN:abdomen soft, non-tender and normal bowel sounds Musculoskeletal:no cyanosis of digits and no clubbing  NEURO: alert & oriented x 3 with fluent speech, no focal motor/sensory deficits  LABORATORY DATA:  I have reviewed the data as listed    Component Value Date/Time   NA 139 10/05/2018 1723   NA 143 07/14/2017 1245   K 3.2 (L) 10/05/2018 1723   K 3.3 (L) 07/14/2017 1245   CL 108 10/05/2018 1723   CO2 23 10/05/2018 1723   CO2 20 (L) 07/14/2017 1245   GLUCOSE 87 10/05/2018 1723   GLUCOSE 106 07/14/2017 1245   BUN 10 10/05/2018 1723   BUN 9.3 07/14/2017 1245   CREATININE 0.75 10/05/2018 1723   CREATININE 0.80 10/03/2018 1324   CREATININE 0.8 07/14/2017 1245   CALCIUM 8.5 (L) 10/05/2018 1723   CALCIUM 8.4 07/14/2017 1245   PROT 6.6 10/05/2018 1723   PROT 6.0 07/14/2017 1245   PROT 6.2 (L) 07/14/2017 1245   ALBUMIN 4.3 10/05/2018 1723   ALBUMIN 3.6 07/14/2017 1245   AST 23 10/05/2018 1723   AST 19 10/03/2018 1324   AST 21 07/14/2017 1245   ALT 18 10/05/2018 1723   ALT 16 10/03/2018 1324   ALT 21 07/14/2017 1245   ALKPHOS 73 10/05/2018 1723   ALKPHOS 78 07/14/2017 1245   BILITOT 0.6 10/05/2018 1723   BILITOT 0.6 10/03/2018 1324   BILITOT 0.43 07/14/2017 1245   GFRNONAA >60 10/05/2018 1723   GFRNONAA >60 10/03/2018 1324   GFRAA >60 10/05/2018 1723   GFRAA >60 10/03/2018 1324    No results found for: SPEP, UPEP  Lab Results  Component Value Date   WBC 5.4 10/05/2018   NEUTROABS 3.9 10/03/2018   HGB 11.9 (L) 10/05/2018   HCT 38.7 10/05/2018   MCV 90.8 10/05/2018   PLT 206 10/05/2018      Chemistry      Component Value Date/Time   NA  139 10/05/2018 1723   NA 143 07/14/2017 1245   K 3.2 (L) 10/05/2018 1723   K 3.3 (L) 07/14/2017 1245   CL 108 10/05/2018 1723   CO2 23 10/05/2018 1723   CO2 20 (L) 07/14/2017 1245  BUN 10 10/05/2018 1723   BUN 9.3 07/14/2017 1245   CREATININE 0.75 10/05/2018 1723   CREATININE 0.80 10/03/2018 1324   CREATININE 0.8 07/14/2017 1245      Component Value Date/Time   CALCIUM 8.5 (L) 10/05/2018 1723   CALCIUM 8.4 07/14/2017 1245   ALKPHOS 73 10/05/2018 1723   ALKPHOS 78 07/14/2017 1245   AST 23 10/05/2018 1723   AST 19 10/03/2018 1324   AST 21 07/14/2017 1245   ALT 18 10/05/2018 1723   ALT 16 10/03/2018 1324   ALT 21 07/14/2017 1245   BILITOT 0.6 10/05/2018 1723   BILITOT 0.6 10/03/2018 1324   BILITOT 0.43 07/14/2017 1245       RADIOGRAPHIC STUDIES: I have reviewed imaging studies with the patient I have personally reviewed the radiological images as listed and agreed with the findings in the report. Dg Chest 2 View  Result Date: 10/05/2018 CLINICAL DATA:  68 y/o  F; chest pain. EXAM: CHEST - 2 VIEW COMPARISON:  07/12/2016 chest radiograph FINDINGS: Stable heart size and mediastinal contours are within normal limits. Both lungs are clear. The visualized skeletal structures are unremarkable. IMPRESSION: No acute pulmonary process identified. Electronically Signed   By: Kristine Garbe M.D.   On: 10/05/2018 18:10   Ct Angio Chest Pe W/cm &/or Wo Cm  Result Date: 10/05/2018 CLINICAL DATA:  Chest pain radiating to the neck and back. Nausea, vomiting and diarrhea. EXAM: CT ANGIOGRAPHY CHEST CT ABDOMEN AND PELVIS WITH CONTRAST TECHNIQUE: Multidetector CT imaging of the chest was performed using the standard protocol during bolus administration of intravenous contrast. Multiplanar CT image reconstructions and MIPs were obtained to evaluate the vascular anatomy. Multidetector CT imaging of the abdomen and pelvis was performed using the standard protocol during bolus administration  of intravenous contrast. CONTRAST:  168m ISOVUE-370 IOPAMIDOL (ISOVUE-370) INJECTION 76% COMPARISON:  Chest CT 07/13/2016 FINDINGS: CTA CHEST FINDINGS Cardiovascular: Contrast injection is sufficient to demonstrate satisfactory opacification of the pulmonary arteries to the segmental level. There is no pulmonary embolus. The main pulmonary artery is within normal limits for size. There is no CT evidence of acute right heart strain. The visualized aorta is normal. There is a normal 3-vessel arch branching pattern. Heart size is normal, without pericardial effusion. Mediastinum/Nodes: No mediastinal, hilar or axillary lymphadenopathy. The visualized thyroid and thoracic esophageal course are unremarkable. Lungs/Pleura: No pulmonary nodules or masses. No pleural effusion or pneumothorax. No focal airspace consolidation. No focal pleural abnormality. Musculoskeletal: No chest wall abnormality. No acute or significant osseous findings. Review of the MIP images confirms the above findings. CT ABDOMEN and PELVIS FINDINGS Hepatobiliary: Normal hepatic contours and density. No visible biliary dilatation. Normal gallbladder. Pancreas: Normal contours without ductal dilatation. No peripancreatic fluid collection. Spleen: Normal. Adrenals/Urinary Tract: --Adrenal glands: Normal. --Right kidney/ureter: No hydronephrosis or perinephric stranding. No nephrolithiasis. No obstructing ureteral stones. --Left kidney/ureter: No hydronephrosis or perinephric stranding. No nephrolithiasis. No obstructing ureteral stones. --Urinary bladder: Unremarkable. Stomach/Bowel: --Stomach/Duodenum: No hiatal hernia or other gastric abnormality. Normal duodenal course and caliber. --Small bowel: No dilatation or inflammation. --Colon: There is mild stranding surrounding the rectum. The remainder of the colon is normal. --Appendix: Normal. Vascular/Lymphatic: Normal course and caliber of the major abdominal vessels. No abdominal or pelvic  lymphadenopathy. Reproductive: Normal uterus and ovaries. Musculoskeletal. No bony spinal canal stenosis or focal osseous abnormality. Other: None. IMPRESSION: 1. No pulmonary embolus or acute aortic syndrome. 2. Mild inflammatory stranding surrounding the rectum possibly indicating colitis. Electronically Signed   By: KLennette Bihari  [place] M.D.   On: 10/05/2018 20:10   Ct Abdomen Pelvis W Contrast  Result Date: 10/05/2018 CLINICAL DATA:  Chest pain radiating to the neck and back. Nausea, vomiting and diarrhea. EXAM: CT ANGIOGRAPHY CHEST CT ABDOMEN AND PELVIS WITH CONTRAST TECHNIQUE: Multidetector CT imaging of the chest was performed using the standard protocol during bolus administration of intravenous contrast. Multiplanar CT image reconstructions and MIPs were obtained to evaluate the vascular anatomy. Multidetector CT imaging of the abdomen and pelvis was performed using the standard protocol during bolus administration of intravenous contrast. CONTRAST:  170m ISOVUE-370 IOPAMIDOL (ISOVUE-370) INJECTION 76% COMPARISON:  Chest CT 07/13/2016 FINDINGS: CTA CHEST FINDINGS Cardiovascular: Contrast injection is sufficient to demonstrate satisfactory opacification of the pulmonary arteries to the segmental level. There is no pulmonary embolus. The main pulmonary artery is within normal limits for size. There is no CT evidence of acute right heart strain. The visualized aorta is normal. There is a normal 3-vessel arch branching pattern. Heart size is normal, without pericardial effusion. Mediastinum/Nodes: No mediastinal, hilar or axillary lymphadenopathy. The visualized thyroid and thoracic esophageal course are unremarkable. Lungs/Pleura: No pulmonary nodules or masses. No pleural effusion or pneumothorax. No focal airspace consolidation. No focal pleural abnormality. Musculoskeletal: No chest wall abnormality. No acute or significant osseous findings. Review of the MIP images confirms the above findings. CT ABDOMEN and  PELVIS FINDINGS Hepatobiliary: Normal hepatic contours and density. No visible biliary dilatation. Normal gallbladder. Pancreas: Normal contours without ductal dilatation. No peripancreatic fluid collection. Spleen: Normal. Adrenals/Urinary Tract: --Adrenal glands: Normal. --Right kidney/ureter: No hydronephrosis or perinephric stranding. No nephrolithiasis. No obstructing ureteral stones. --Left kidney/ureter: No hydronephrosis or perinephric stranding. No nephrolithiasis. No obstructing ureteral stones. --Urinary bladder: Unremarkable. Stomach/Bowel: --Stomach/Duodenum: No hiatal hernia or other gastric abnormality. Normal duodenal course and caliber. --Small bowel: No dilatation or inflammation. --Colon: There is mild stranding surrounding the rectum. The remainder of the colon is normal. --Appendix: Normal. Vascular/Lymphatic: Normal course and caliber of the major abdominal vessels. No abdominal or pelvic lymphadenopathy. Reproductive: Normal uterus and ovaries. Musculoskeletal. No bony spinal canal stenosis or focal osseous abnormality. Other: None. IMPRESSION: 1. No pulmonary embolus or acute aortic syndrome. 2. Mild inflammatory stranding surrounding the rectum possibly indicating colitis. Electronically Signed   By: KUlyses JarredM.D.   On: 10/05/2018 20:10    All questions were answered. The patient knows to call the clinic with any problems, questions or concerns. No barriers to learning was detected.  I spent 15 minutes counseling the patient face to face. The total time spent in the appointment was 20 minutes and more than 50% was on counseling and review of test results  NHeath Lark MD 10/12/2018 1:37 PM

## 2018-10-12 NOTE — Assessment & Plan Note (Signed)
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  Observe  

## 2018-10-12 NOTE — Telephone Encounter (Signed)
Gave avs and calendar ° °

## 2018-10-12 NOTE — Assessment & Plan Note (Signed)
She has recent extensive evaluation for atypical chest pain Her symptoms has resolved CT imaging showed mild colitis changes but she is currently not symptomatic Observe only

## 2018-11-09 ENCOUNTER — Other Ambulatory Visit: Payer: Self-pay

## 2018-11-09 MED ORDER — LENALIDOMIDE 5 MG PO CAPS: 5.0000 mg | ORAL_CAPSULE | Freq: Every day | ORAL | 11 refills | Status: DC

## 2018-11-13 ENCOUNTER — Other Ambulatory Visit: Payer: Self-pay | Admitting: *Deleted

## 2018-12-07 ENCOUNTER — Telehealth: Payer: Self-pay

## 2018-12-07 ENCOUNTER — Other Ambulatory Visit: Payer: Self-pay

## 2018-12-07 MED ORDER — LENALIDOMIDE 5 MG PO CAPS: 5.0000 mg | ORAL_CAPSULE | Freq: Every day | ORAL | 11 refills | Status: DC

## 2018-12-07 NOTE — Telephone Encounter (Signed)
She called and left a message requesting refill on Revlimid. Called her back and told Rx sent to pharmacy. She verbalized understanding.

## 2018-12-19 ENCOUNTER — Other Ambulatory Visit: Payer: Self-pay | Admitting: Hematology and Oncology

## 2019-01-02 ENCOUNTER — Other Ambulatory Visit: Payer: Self-pay

## 2019-01-02 MED ORDER — LENALIDOMIDE 5 MG PO CAPS: 5.0000 mg | ORAL_CAPSULE | Freq: Every day | ORAL | 11 refills | Status: DC

## 2019-01-04 ENCOUNTER — Other Ambulatory Visit: Payer: Self-pay

## 2019-01-04 ENCOUNTER — Inpatient Hospital Stay: Payer: Medicare Other | Attending: Hematology and Oncology

## 2019-01-04 DIAGNOSIS — N183 Chronic kidney disease, stage 3 unspecified: Secondary | ICD-10-CM

## 2019-01-04 DIAGNOSIS — C9001 Multiple myeloma in remission: Secondary | ICD-10-CM

## 2019-01-04 LAB — CBC WITH DIFFERENTIAL/PLATELET
Abs Immature Granulocytes: 0.02 10*3/uL (ref 0.00–0.07)
Basophils Absolute: 0.1 10*3/uL (ref 0.0–0.1)
Basophils Relative: 2 %
Eosinophils Absolute: 0.6 10*3/uL — ABNORMAL HIGH (ref 0.0–0.5)
Eosinophils Relative: 15 %
HCT: 34.2 % — ABNORMAL LOW (ref 36.0–46.0)
Hemoglobin: 10.9 g/dL — ABNORMAL LOW (ref 12.0–15.0)
Immature Granulocytes: 1 %
Lymphocytes Relative: 32 %
Lymphs Abs: 1.2 10*3/uL (ref 0.7–4.0)
MCH: 28.8 pg (ref 26.0–34.0)
MCHC: 31.9 g/dL (ref 30.0–36.0)
MCV: 90.5 fL (ref 80.0–100.0)
Monocytes Absolute: 0.4 10*3/uL (ref 0.1–1.0)
Monocytes Relative: 10 %
Neutro Abs: 1.5 10*3/uL — ABNORMAL LOW (ref 1.7–7.7)
Neutrophils Relative %: 40 %
Platelets: 256 10*3/uL (ref 150–400)
RBC: 3.78 MIL/uL — ABNORMAL LOW (ref 3.87–5.11)
RDW: 15.3 % (ref 11.5–15.5)
WBC: 3.7 10*3/uL — ABNORMAL LOW (ref 4.0–10.5)
nRBC: 0 % (ref 0.0–0.2)

## 2019-01-04 LAB — CMP (CANCER CENTER ONLY)
ALT: 18 U/L (ref 0–44)
AST: 22 U/L (ref 15–41)
Albumin: 3.7 g/dL (ref 3.5–5.0)
Alkaline Phosphatase: 79 U/L (ref 38–126)
Anion gap: 8 (ref 5–15)
BUN: 8 mg/dL (ref 8–23)
CO2: 25 mmol/L (ref 22–32)
Calcium: 8.5 mg/dL — ABNORMAL LOW (ref 8.9–10.3)
Chloride: 111 mmol/L (ref 98–111)
Creatinine: 0.88 mg/dL (ref 0.44–1.00)
GFR, Est AFR Am: >60 mL/min (ref >60)
GFR, Estimated: >60 mL/min (ref >60)
Glucose, Bld: 90 mg/dL (ref 70–99)
Potassium: 3.5 mmol/L (ref 3.5–5.1)
Sodium: 144 mmol/L (ref 135–145)
Total Bilirubin: 0.5 mg/dL (ref 0.3–1.2)
Total Protein: 6.3 g/dL — ABNORMAL LOW (ref 6.5–8.1)

## 2019-01-05 LAB — MULTIPLE MYELOMA PANEL, SERUM
Albumin SerPl Elph-Mcnc: 3.4 g/dL (ref 2.9–4.4)
Albumin/Glob SerPl: 1.5 (ref 0.7–1.7)
Alpha 1: 0.3 g/dL (ref 0.0–0.4)
Alpha2 Glob SerPl Elph-Mcnc: 0.7 g/dL (ref 0.4–1.0)
B-Globulin SerPl Elph-Mcnc: 0.9 g/dL (ref 0.7–1.3)
Gamma Glob SerPl Elph-Mcnc: 0.4 g/dL (ref 0.4–1.8)
Globulin, Total: 2.3 g/dL (ref 2.2–3.9)
IgA: 49 mg/dL — ABNORMAL LOW (ref 87–352)
IgG (Immunoglobin G), Serum: 468 mg/dL — ABNORMAL LOW (ref 586–1602)
IgM (Immunoglobulin M), Srm: 10 mg/dL — ABNORMAL LOW (ref 26–217)
M Protein SerPl Elph-Mcnc: 0.2 g/dL — ABNORMAL HIGH
Total Protein ELP: 5.7 g/dL — ABNORMAL LOW (ref 6.0–8.5)

## 2019-01-05 LAB — KAPPA/LAMBDA LIGHT CHAINS
Kappa free light chain: 15.4 mg/L (ref 3.3–19.4)
Kappa, lambda light chain ratio: 2.41 — ABNORMAL HIGH (ref 0.26–1.65)
Lambda free light chains: 6.4 mg/L (ref 5.7–26.3)

## 2019-01-11 ENCOUNTER — Telehealth: Payer: Self-pay | Admitting: Hematology and Oncology

## 2019-01-11 ENCOUNTER — Inpatient Hospital Stay: Payer: Medicare Other

## 2019-01-11 ENCOUNTER — Other Ambulatory Visit: Payer: Self-pay

## 2019-01-11 ENCOUNTER — Encounter: Payer: Self-pay | Admitting: Hematology and Oncology

## 2019-01-11 ENCOUNTER — Inpatient Hospital Stay: Payer: Medicare Other | Attending: Hematology and Oncology | Admitting: Hematology and Oncology

## 2019-01-11 VITALS — BP 140/99 | HR 73 | Temp 97.8°F | Resp 17 | Ht 60.25 in | Wt 114.2 lb

## 2019-01-11 DIAGNOSIS — J309 Allergic rhinitis, unspecified: Secondary | ICD-10-CM | POA: Diagnosis not present

## 2019-01-11 DIAGNOSIS — R0981 Nasal congestion: Secondary | ICD-10-CM

## 2019-01-11 DIAGNOSIS — C9001 Multiple myeloma in remission: Secondary | ICD-10-CM | POA: Diagnosis not present

## 2019-01-11 DIAGNOSIS — K219 Gastro-esophageal reflux disease without esophagitis: Secondary | ICD-10-CM

## 2019-01-11 DIAGNOSIS — Z9221 Personal history of antineoplastic chemotherapy: Secondary | ICD-10-CM | POA: Diagnosis not present

## 2019-01-11 DIAGNOSIS — D61818 Other pancytopenia: Secondary | ICD-10-CM

## 2019-01-11 DIAGNOSIS — Z923 Personal history of irradiation: Secondary | ICD-10-CM | POA: Diagnosis not present

## 2019-01-11 DIAGNOSIS — Z9484 Stem cells transplant status: Secondary | ICD-10-CM | POA: Insufficient documentation

## 2019-01-11 DIAGNOSIS — Z79899 Other long term (current) drug therapy: Secondary | ICD-10-CM | POA: Insufficient documentation

## 2019-01-11 DIAGNOSIS — Z7982 Long term (current) use of aspirin: Secondary | ICD-10-CM

## 2019-01-11 DIAGNOSIS — Z9481 Bone marrow transplant status: Secondary | ICD-10-CM

## 2019-01-11 MED ORDER — SODIUM CHLORIDE 0.9 % IV SOLN
Freq: Once | INTRAVENOUS | Status: AC
  Administered 2019-01-11: 10:00:00 via INTRAVENOUS
  Filled 2019-01-11: qty 250

## 2019-01-11 MED ORDER — ZOLEDRONIC ACID 4 MG/100ML IV SOLN
4.0000 mg | Freq: Once | INTRAVENOUS | Status: AC
  Administered 2019-01-11: 4 mg via INTRAVENOUS
  Filled 2019-01-11: qty 100

## 2019-01-11 NOTE — Assessment & Plan Note (Signed)
She has mild hypocalcemia but is taking high-dose vitamin D and calcium supplement I will check serum vitamin D in her next visit We will proceed with Zometa She is not symptomatic

## 2019-01-11 NOTE — Assessment & Plan Note (Signed)
She has allergic rhinitis She will continue over-the-counter antihistamines

## 2019-01-11 NOTE — Telephone Encounter (Signed)
Cld and confirmed appts w/pt to see Dr. Alvy Bimler on 9/4 at 1pm and labs on 8/28 at 1:15pm.

## 2019-01-11 NOTE — Assessment & Plan Note (Signed)
I have reviewed blood work with the patient It appears that M protein was detectable in the last 6 months and slowly rising IgG level and serum light chain levels remain low/within normal limits We will get a copy of the result faxed to her oncologist at [facility] for review In the meantime, I plan to continue Revlimid for another 3 months If her next myeloma panel is worse, we can refer her back to [facility] for further discussion about treatment options She will continue Revlimid at current dose along with aspirin for DVT prophylaxis She will continue calcium with vitamin D and Zometa every 6 months, due today

## 2019-01-11 NOTE — Patient Instructions (Signed)
[name] Discharge Instructions for Patients Receiving Chemotherapy  Today you received the following agent:  Zometa  To help prevent nausea and vomiting after your treatment, we encourage you to take your nausea medication as directed by your MD.   If you develop nausea and vomiting that is not controlled by your nausea medication, call the clinic.   BELOW ARE SYMPTOMS THAT SHOULD BE REPORTED IMMEDIATELY:  *FEVER GREATER THAN 100.5 F  *CHILLS WITH OR WITHOUT FEVER  NAUSEA AND VOMITING THAT IS NOT CONTROLLED WITH YOUR NAUSEA MEDICATION  *UNUSUAL SHORTNESS OF BREATH  *UNUSUAL BRUISING OR BLEEDING  TENDERNESS IN MOUTH AND THROAT WITH OR WITHOUT PRESENCE OF ULCERS  *URINARY PROBLEMS  *BOWEL PROBLEMS  UNUSUAL RASH Items with * indicate a potential emergency and should be followed up as soon as possible.  Feel free to call the clinic should you have any questions or concerns. The clinic phone number is [phone].  Please show the [place] at check-in to the Emergency Department and triage nurse.  Zoledronic Acid injection (Hypercalcemia, Oncology) What is this medicine? ZOLEDRONIC ACID (ZOE le dron ik AS id) lowers the amount of calcium loss from bone. It is used to treat too much calcium in your blood from cancer. It is also used to prevent complications of cancer that has spread to the bone. This medicine may be used for other purposes; ask your health care provider or pharmacist if you have questions. COMMON BRAND NAME(S): Zometa What should I tell my health care provider before I take this medicine? They need to know if you have any of these conditions: -aspirin-sensitive asthma -cancer, especially if you are receiving medicines used to treat cancer -dental disease or wear dentures -infection -kidney disease -receiving corticosteroids like dexamethasone or prednisone -an unusual or allergic reaction to zoledronic acid, other medicines,  foods, dyes, or preservatives -pregnant or trying to get pregnant -breast-feeding How should I use this medicine? This medicine is for infusion into a vein. It is given by a health care professional in a hospital or clinic setting. Talk to your pediatrician regarding the use of this medicine in children. Special care may be needed. Overdosage: If you think you have taken too much of this medicine contact a poison control center or emergency room at once. NOTE: This medicine is only for you. Do not share this medicine with others. What if I miss a dose? It is important not to miss your dose. Call your doctor or health care professional if you are unable to keep an appointment. What may interact with this medicine? -certain antibiotics given by injection -NSAIDs, medicines for pain and inflammation, like ibuprofen or naproxen -some diuretics like bumetanide, furosemide -teriparatide -thalidomide This list may not describe all possible interactions. Give your health care provider a list of all the medicines, herbs, non-prescription drugs, or dietary supplements you use. Also tell them if you smoke, drink alcohol, or use illegal drugs. Some items may interact with your medicine. What should I watch for while using this medicine? Visit your doctor or health care professional for regular checkups. It may be some time before you see the benefit from this medicine. Do not stop taking your medicine unless your doctor tells you to. Your doctor may order blood tests or other tests to see how you are doing. Women should inform their doctor if they wish to become pregnant or think they might be pregnant. There is a potential for serious side effects to an unborn  child. Talk to your health care professional or pharmacist for more information. You should make sure that you get enough calcium and vitamin D while you are taking this medicine. Discuss the foods you eat and the vitamins you take with your health  care professional. Some people who take this medicine have severe bone, joint, and/or muscle pain. This medicine may also increase your risk for jaw problems or a broken thigh bone. Tell your doctor right away if you have severe pain in your jaw, bones, joints, or muscles. Tell your doctor if you have any pain that does not go away or that gets worse. Tell your dentist and dental surgeon that you are taking this medicine. You should not have major dental surgery while on this medicine. See your dentist to have a dental exam and fix any dental problems before starting this medicine. Take good care of your teeth while on this medicine. Make sure you see your dentist for regular follow-up appointments. What side effects may I notice from receiving this medicine? Side effects that you should report to your doctor or health care professional as soon as possible: -allergic reactions like skin rash, itching or hives, swelling of the face, lips, or tongue -anxiety, confusion, or depression -breathing problems -changes in vision -eye pain -feeling faint or lightheaded, falls -jaw pain, especially after dental work -mouth sores -muscle cramps, stiffness, or weakness -redness, blistering, peeling or loosening of the skin, including inside the mouth -trouble passing urine or change in the amount of urine Side effects that usually do not require medical attention (report to your doctor or health care professional if they continue or are bothersome): -bone, joint, or muscle pain -constipation -diarrhea -fever -hair loss -irritation at site where injected -loss of appetite -nausea, vomiting -stomach upset -trouble sleeping -trouble swallowing -weak or tired This list may not describe all possible side effects. Call your doctor for medical advice about side effects. You may report side effects to FDA at 1-800-FDA-1088. Where should I keep my medicine? This drug is given in a hospital or clinic and  will not be stored at home. NOTE: This sheet is a summary. It may not cover all possible information. If you have questions about this medicine, talk to your doctor, pharmacist, or health care provider.  2019 Elsevier/Gold Standard (2013-12-22 14:19:39)  Coronavirus (COVID-19) Are you at risk?  Are you at risk for the Coronavirus (COVID-19)?  To be considered HIGH RISK for Coronavirus (COVID-19), you have to meet the following criteria:  . Traveled to [place], [place], [place], [place] or [place]; or in the [place] to [place], [place], [place], or [place]; and have fever, cough, and shortness of breath within the last 2 weeks of travel OR . Been in close contact with a person diagnosed with COVID-19 within the last 2 weeks and have fever, cough, and shortness of breath . IF YOU DO NOT MEET THESE CRITERIA, YOU ARE CONSIDERED LOW RISK FOR COVID-19.  What to do if you are HIGH RISK for COVID-19?  Marland Kitchen If you are having a medical emergency, call 911. . Seek medical care right away. Before you go to a doctor's office, urgent care or emergency department, call ahead and tell them about your recent travel, contact with someone diagnosed with COVID-19, and your symptoms. You should receive instructions from your physician's office regarding next steps of care.  . When you arrive at healthcare provider, tell the healthcare staff immediately you have returned from visiting [place],  [place], [place], [place] or [place]; or traveled in the [place] to [place], [place], [place], or [place]; in the last two weeks or you have been in close contact with a person diagnosed with COVID-19 in the last 2 weeks.   . Tell the health care staff about your symptoms: fever, cough and shortness of breath. . After you have been seen by a medical provider, you will be either: o Tested for (COVID-19) and discharged home on quarantine except to seek medical care if symptoms worsen, and asked to  - Stay  home and avoid contact with others until you get your results (4-5 days)  - Avoid travel on public transportation if possible (such as bus, train, or airplane) or o Sent to the Emergency Department by EMS for evaluation, COVID-19 testing, and possible admission depending on your condition and test results.  What to do if you are LOW RISK for COVID-19?  Reduce your risk of any infection by using the same precautions used for avoiding the common cold or flu:  Marland Kitchen Wash your hands often with soap and warm water for at least 20 seconds.  If soap and water are not readily available, use an alcohol-based hand sanitizer with at least 60% alcohol.  . If coughing or sneezing, cover your mouth and nose by coughing or sneezing into the elbow areas of your shirt or coat, into a tissue or into your sleeve (not your hands). . Avoid shaking hands with others and consider head nods or verbal greetings only. . Avoid touching your eyes, nose, or mouth with unwashed hands.  . Avoid close contact with people who are sick. . Avoid places or events with large numbers of people in one location, like concerts or sporting events. . Carefully consider travel plans you have or are making. . If you are planning any travel outside or inside the [place], visit the CDC's Travelers' Health webpage for the latest health notices. . If you have some symptoms but not all symptoms, continue to monitor at home and seek medical attention if your symptoms worsen. . If you are having a medical emergency, call 911.   [place] / e-Visit: [email or web address]         [facility]: [facility]: [phone]                   [facility]: [phone]

## 2019-01-11 NOTE — Progress Notes (Signed)
[place] OFFICE PROGRESS NOTE  Patient Care Team: Josetta Huddle, MD as PCP - General (Internal Medicine) Ginette Pitman, MD as Consulting Physician (Hematology and Oncology) Hessie Dibble, MD as Consulting Physician (Hematology and Oncology)  ASSESSMENT & PLAN:  Multiple myeloma in remission [facility]) I have reviewed blood work with the patient It appears that M protein was detectable in the last 6 months and slowly rising IgG level and serum light chain levels remain low/within normal limits We will get a copy of the result faxed to her oncologist at [facility] for review In the meantime, I plan to continue Revlimid for another 3 months If her next myeloma panel is worse, we can refer her back to [facility] for further discussion about treatment options She will continue Revlimid at current dose along with aspirin for DVT prophylaxis She will continue calcium with vitamin D and Zometa every 6 months, due today  Pancytopenia, acquired ([place]) She has mild pancytopenia due to recent treatment She is not symptomatic We discussed neutropenic precaution  Nasal congestion She has allergic rhinitis She will continue over-the-counter antihistamines   Hypocalcemia She has mild hypocalcemia but is taking high-dose vitamin D and calcium supplement I will check serum vitamin D in her next visit We will proceed with Zometa She is not symptomatic   Orders Placed This Encounter  Procedures  . CBC with Differential/Platelet    Standing Status:   Future    Standing Expiration Date:   02/15/2020  . Comprehensive metabolic panel    Standing Status:   Future    Standing Expiration Date:   02/15/2020  . Kappa/lambda light chains    Standing Status:   Future    Standing Expiration Date:   02/15/2020  . Multiple Myeloma Panel (SPEP&IFE w/QIG)    Standing Status:   Future    Standing Expiration Date:   02/15/2020  . VITAMIN D 25 Hydroxy (Vit-D Deficiency, Fractures)    Standing  Status:   Future    Standing Expiration Date:   07/12/2020  . Beta 2 microglobulin, serum    Standing Status:   Future    Standing Expiration Date:   02/15/2020    INTERVAL HISTORY: Please see below for problem oriented charting. She returns for Zometa and myeloma follow-up She denies recent dental issues No new bone pain Her appetite is fair She have chronic sinus allergies Denies recent fever or chills No recent cough  SUMMARY OF ONCOLOGIC HISTORY: Oncology History    M-protein 0.69 gm/dl IFIX - IgG, Kappa IgG - 868 IgA - 19 IgM - < 20 Kappa - 21 Lambda - 5.7  09/06/2014 - Bone marrow aspirate and biopsy:   Normocellular marrow for age (40%) with a small monoclonal plasma cell population (1% on aspirate). Karyotype 82, XX  FISH Negative for myeloma associated changes  09/12/2014 - PET/CT  Two regions that are concerning for disease, one adjacent/involving the left ninth rib and one in the marrow of the right femur, in this patient with history of plasmacytoma.      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    04/01/2015 Bone Marrow Transplant    She received melphalan chemotherapy on 03/31/2015 followed by autologous stem cell transplant the day after    04/03/2015 - 04/18/2015 Hospital Admission    The patient was admitted to the hospital at [facility] for management related to complication from stem cell transplant. She had significant nausea requiring intravenous anti-emetics.    07/17/2015 -  Chemotherapy    She started maintenance Revlimid and monthly zometa, then every 3 months    06/01/2018 Imaging    DEXA scan showed bone density T score in femur -2.3     REVIEW OF SYSTEMS:   Constitutional: Denies fevers, chills or abnormal weight loss Eyes: Denies blurriness of vision Ears, nose, mouth, throat, and face: Denies mucositis or sore throat Respiratory: Denies cough, dyspnea or wheezes Cardiovascular: Denies palpitation, chest discomfort or lower extremity swelling Gastrointestinal:  Denies nausea, heartburn or change in bowel habits Skin: Denies abnormal skin rashes Lymphatics: Denies new lymphadenopathy or easy bruising Neurological:Denies numbness, tingling or new weaknesses Behavioral/Psych: Mood is stable, no new changes  All other systems were reviewed with the patient and are negative.  I have reviewed the past medical history, past surgical history, social history and family history with the patient and they are unchanged from previous note.  ALLERGIES:  is allergic to augmentin [amoxicillin-pot clavulanate]; albuterol; codeine; ibuprofen; nsaids; and tolmetin.  MEDICATIONS:  Current Outpatient Medications  Medication Sig Dispense Refill  . acetaminophen (TYLENOL) 500 MG tablet Take 500-1,000 mg by mouth every 6 (six) hours as needed for mild pain, moderate pain, fever or headache.    Marland Kitchen acyclovir (ZOVIRAX) 400 MG tablet Take 1 tablet (400 mg total) by mouth 2 (two) times daily. 180  tablet 3  . aspirin EC 81 MG tablet Take 81 mg by mouth daily with breakfast.    . calcium carbonate (OSCAL) 1500 (600 Ca) MG TABS tablet Take 1,500 mg by mouth daily with breakfast.    . Calcium Carbonate 500 MG CHEW Chew 500 mg by mouth as needed for indigestion.    . Cholecalciferol (VITAMIN D3) 2000 units capsule Take 2,000 Units by mouth daily.     Marland Kitchen HYDROcodone-acetaminophen (NORCO/VICODIN) 5-325 MG tablet Take 1 tablet by mouth every 6 (six) hours as needed for moderate pain. 10 tablet 0  . lenalidomide (REVLIMID) 5 MG capsule Take 1 capsule (5 mg total) by mouth daily. 21 days on and 7 days off. 21 capsule 11  . Multiple Vitamins-Minerals (CENTRUM SILVER PO) Take 1 tablet by mouth daily.     Marland Kitchen omeprazole (PRILOSEC) 20 MG capsule TAKE 1 CAPSULE(20 MG) BY MOUTH DAILY (Patient taking differently: Take 20 mg by mouth daily. ) 90 capsule 9  . ondansetron (ZOFRAN ODT) 4 MG disintegrating tablet Take  1 tablet (4 mg total) by mouth every 8 (eight) hours as needed for nausea or vomiting. 12 tablet 1  . ondansetron (ZOFRAN) 8 MG tablet Take 1 tablet (8 mg total) by mouth every 8 (eight) hours as needed for nausea or vomiting. 60 tablet 1  . senna-docusate (SENOKOT-S) 8.6-50 MG tablet Take 1-2 tablets by mouth daily as needed for mild constipation or moderate constipation.     . sodium chloride (OCEAN) 0.65 % SOLN nasal spray Place 1 spray into both nostrils as needed for congestion.     No current facility-administered medications for this visit.     PHYSICAL EXAMINATION: ECOG PERFORMANCE STATUS: 1 - Symptomatic but completely ambulatory  Vitals:   01/11/19 0911  BP: (!) 140/99  Pulse: 73  Resp: 17  Temp: 97.8 F (36.6 C)  SpO2: 100%   Filed Weights   01/11/19 0911  Weight: 114 lb 3.2 oz (51.8 kg)    GENERAL:alert, no distress and comfortable SKIN: skin color, texture, turgor are normal, no rashes or significant lesions EYES: normal, Conjunctiva are pink and non-injected, sclera  clear OROPHARYNX:no exudate, no erythema and lips, buccal mucosa, and tongue normal  NECK: supple, thyroid normal size, non-tender, without nodularity LYMPH:  no palpable lymphadenopathy in the cervical, axillary or inguinal LUNGS: clear to auscultation and percussion with normal breathing effort HEART: regular rate & rhythm and no murmurs and no lower extremity edema ABDOMEN:abdomen soft, non-tender and normal bowel sounds Musculoskeletal:no cyanosis of digits and no clubbing  NEURO: alert & oriented x 3 with fluent speech, no focal motor/sensory deficits  LABORATORY DATA:  I have reviewed the data as listed    Component Value Date/Time   NA 144 01/04/2019 1058   NA 143 07/14/2017 1245   K 3.5 01/04/2019 1058   K 3.3 (L) 07/14/2017 1245   CL 111 01/04/2019 1058   CO2 25 01/04/2019 1058   CO2 20 (L) 07/14/2017 1245   GLUCOSE 90 01/04/2019 1058   GLUCOSE 106 07/14/2017 1245   BUN 8 01/04/2019 1058   BUN 9.3 07/14/2017 1245   CREATININE 0.88 01/04/2019 1058   CREATININE 0.8 07/14/2017 1245   CALCIUM 8.5 (L) 01/04/2019 1058   CALCIUM 8.4 07/14/2017 1245   PROT 6.3 (L) 01/04/2019 1058   PROT 6.0 07/14/2017 1245   PROT 6.2 (L) 07/14/2017 1245   ALBUMIN 3.7 01/04/2019 1058   ALBUMIN 3.6 07/14/2017 1245   AST 22 01/04/2019 1058   AST 21 07/14/2017 1245   ALT 18 01/04/2019 1058   ALT 21 07/14/2017 1245   ALKPHOS 79 01/04/2019 1058   ALKPHOS 78 07/14/2017 1245   BILITOT 0.5 01/04/2019 1058   BILITOT 0.43 07/14/2017 1245   GFRNONAA >60 01/04/2019 1058   GFRAA >60 01/04/2019 1058    No results found for: SPEP, UPEP  Lab Results  Component Value Date   WBC 3.7 (L) 01/04/2019   NEUTROABS 1.5 (L) 01/04/2019   HGB 10.9 (L) 01/04/2019   HCT 34.2 (L) 01/04/2019   MCV 90.5 01/04/2019   PLT 256 01/04/2019      Chemistry      Component Value Date/Time   NA 144 01/04/2019 1058   NA 143 07/14/2017 1245   K 3.5 01/04/2019 1058   K 3.3 (L) 07/14/2017 1245   CL 111 01/04/2019  1058   CO2 25 01/04/2019 1058   CO2 20 (L) 07/14/2017 1245   BUN 8 01/04/2019 1058   BUN 9.3 07/14/2017 1245   CREATININE 0.88 01/04/2019 1058  CREATININE 0.8 07/14/2017 1245      Component Value Date/Time   CALCIUM 8.5 (L) 01/04/2019 1058   CALCIUM 8.4 07/14/2017 1245   ALKPHOS 79 01/04/2019 1058   ALKPHOS 78 07/14/2017 1245   AST 22 01/04/2019 1058   AST 21 07/14/2017 1245   ALT 18 01/04/2019 1058   ALT 21 07/14/2017 1245   BILITOT 0.5 01/04/2019 1058   BILITOT 0.43 07/14/2017 1245       All questions were answered. The patient knows to call the clinic with any problems, questions or concerns. No barriers to learning was detected.  I spent 15 minutes counseling the patient face to face. The total time spent in the appointment was 20 minutes and more than 50% was on counseling and review of test results  [name], MD 01/11/2019 9:38 AM

## 2019-01-11 NOTE — Progress Notes (Signed)
MD ok with Calcium level. Authorized to treat with Zometa.  Larene Beach, PharmD

## 2019-01-11 NOTE — Assessment & Plan Note (Signed)
She has mild pancytopenia due to recent treatment She is not symptomatic We discussed neutropenic precaution

## 2019-02-01 ENCOUNTER — Other Ambulatory Visit: Payer: Self-pay

## 2019-02-01 MED ORDER — LENALIDOMIDE 5 MG PO CAPS: 5.0000 mg | ORAL_CAPSULE | Freq: Every day | ORAL | 11 refills | Status: DC

## 2019-02-16 ENCOUNTER — Other Ambulatory Visit: Payer: Self-pay

## 2019-02-16 MED ORDER — LENALIDOMIDE 5 MG PO CAPS: 5.0000 mg | ORAL_CAPSULE | Freq: Every day | ORAL | 11 refills | Status: DC

## 2019-03-30 ENCOUNTER — Ambulatory Visit
Admission: RE | Admit: 2019-03-30 | Discharge: 2019-03-30 | Disposition: A | Discharge status: Home / Self Care | Payer: Medicare Other | Source: Ambulatory Visit | Attending: Internal Medicine | Admitting: Internal Medicine

## 2019-03-30 ENCOUNTER — Other Ambulatory Visit: Payer: Self-pay | Admitting: Internal Medicine

## 2019-03-30 DIAGNOSIS — R0989 Other specified symptoms and signs involving the circulatory and respiratory systems: Secondary | ICD-10-CM

## 2019-04-02 ENCOUNTER — Other Ambulatory Visit: Payer: Self-pay

## 2019-04-02 MED ORDER — LENALIDOMIDE 5 MG PO CAPS: 5.0000 mg | ORAL_CAPSULE | Freq: Every day | ORAL | 11 refills | Status: DC

## 2019-04-06 ENCOUNTER — Inpatient Hospital Stay: Payer: Medicare Other | Attending: Hematology and Oncology

## 2019-04-06 ENCOUNTER — Other Ambulatory Visit: Payer: Self-pay

## 2019-04-06 DIAGNOSIS — C9001 Multiple myeloma in remission: Secondary | ICD-10-CM | POA: Insufficient documentation

## 2019-04-06 LAB — COMPREHENSIVE METABOLIC PANEL
ALT: 12 U/L (ref 0–44)
AST: 16 U/L (ref 15–41)
Albumin: 3 g/dL — ABNORMAL LOW (ref 3.5–5.0)
Alkaline Phosphatase: 102 U/L (ref 38–126)
Anion gap: 9 (ref 5–15)
BUN: 9 mg/dL (ref 8–23)
CO2: 23 mmol/L (ref 22–32)
Calcium: 8.4 mg/dL — ABNORMAL LOW (ref 8.9–10.3)
Chloride: 109 mmol/L (ref 98–111)
Creatinine, Ser: 0.83 mg/dL (ref 0.44–1.00)
GFR calc Af Amer: >60 mL/min (ref >60)
GFR calc non Af Amer: >60 mL/min (ref >60)
Glucose, Bld: 111 mg/dL — ABNORMAL HIGH (ref 70–99)
Potassium: 3.9 mmol/L (ref 3.5–5.1)
Sodium: 141 mmol/L (ref 135–145)
Total Bilirubin: 0.3 mg/dL (ref 0.3–1.2)
Total Protein: 6.4 g/dL — ABNORMAL LOW (ref 6.5–8.1)

## 2019-04-06 LAB — CBC WITH DIFFERENTIAL/PLATELET
Abs Immature Granulocytes: 0.02 10*3/uL (ref 0.00–0.07)
Basophils Absolute: 0.2 10*3/uL — ABNORMAL HIGH (ref 0.0–0.1)
Basophils Relative: 4 %
Eosinophils Absolute: 0.2 10*3/uL (ref 0.0–0.5)
Eosinophils Relative: 6 %
HCT: 33.9 % — ABNORMAL LOW (ref 36.0–46.0)
Hemoglobin: 11 g/dL — ABNORMAL LOW (ref 12.0–15.0)
Immature Granulocytes: 1 %
Lymphocytes Relative: 30 %
Lymphs Abs: 1.3 10*3/uL (ref 0.7–4.0)
MCH: 28.5 pg (ref 26.0–34.0)
MCHC: 32.4 g/dL (ref 30.0–36.0)
MCV: 87.8 fL (ref 80.0–100.0)
Monocytes Absolute: 0.4 10*3/uL (ref 0.1–1.0)
Monocytes Relative: 10 %
Neutro Abs: 2.1 10*3/uL (ref 1.7–7.7)
Neutrophils Relative %: 49 %
Platelets: 374 10*3/uL (ref 150–400)
RBC: 3.86 MIL/uL — ABNORMAL LOW (ref 3.87–5.11)
RDW: 15.6 % — ABNORMAL HIGH (ref 11.5–15.5)
WBC: 4.1 10*3/uL (ref 4.0–10.5)
nRBC: 0 % (ref 0.0–0.2)

## 2019-04-07 LAB — VITAMIN D 25 HYDROXY (VIT D DEFICIENCY, FRACTURES): Vit D, 25-Hydroxy: 60.7 ng/mL (ref 30.0–100.0)

## 2019-04-07 LAB — BETA 2 MICROGLOBULIN, SERUM: Beta-2 Microglobulin: 3.3 mg/L — ABNORMAL HIGH (ref 0.6–2.4)

## 2019-04-09 LAB — MULTIPLE MYELOMA PANEL, SERUM
Albumin SerPl Elph-Mcnc: 2.9 g/dL (ref 2.9–4.4)
Albumin/Glob SerPl: 1.1 (ref 0.7–1.7)
Alpha 1: 0.3 g/dL (ref 0.0–0.4)
Alpha2 Glob SerPl Elph-Mcnc: 1.2 g/dL — ABNORMAL HIGH (ref 0.4–1.0)
B-Globulin SerPl Elph-Mcnc: 0.8 g/dL (ref 0.7–1.3)
Gamma Glob SerPl Elph-Mcnc: 0.5 g/dL (ref 0.4–1.8)
Globulin, Total: 2.8 g/dL (ref 2.2–3.9)
IgA: 94 mg/dL (ref 87–352)
IgG (Immunoglobin G), Serum: 550 mg/dL — ABNORMAL LOW (ref 586–1602)
IgM (Immunoglobulin M), Srm: 11 mg/dL — ABNORMAL LOW (ref 26–217)
M Protein SerPl Elph-Mcnc: 0.2 g/dL — ABNORMAL HIGH
Total Protein ELP: 5.7 g/dL — ABNORMAL LOW (ref 6.0–8.5)

## 2019-04-09 LAB — KAPPA/LAMBDA LIGHT CHAINS
Kappa free light chain: 17.4 mg/L (ref 3.3–19.4)
Kappa, lambda light chain ratio: 1.08 (ref 0.26–1.65)
Lambda free light chains: 16.1 mg/L (ref 5.7–26.3)

## 2019-04-13 ENCOUNTER — Encounter: Payer: Self-pay | Admitting: Hematology and Oncology

## 2019-04-13 ENCOUNTER — Inpatient Hospital Stay: Payer: Medicare Other

## 2019-04-13 ENCOUNTER — Inpatient Hospital Stay: Payer: Medicare Other | Attending: Hematology and Oncology | Admitting: Hematology and Oncology

## 2019-04-13 ENCOUNTER — Other Ambulatory Visit: Payer: Self-pay

## 2019-04-13 ENCOUNTER — Ambulatory Visit: Payer: Medicare Other | Admitting: Hematology and Oncology

## 2019-04-13 ENCOUNTER — Telehealth: Payer: Self-pay | Admitting: Hematology and Oncology

## 2019-04-13 VITALS — BP 151/98 | HR 78 | Temp 98.7°F | Resp 18 | Ht 60.25 in | Wt 104.7 lb

## 2019-04-13 DIAGNOSIS — T451X5A Adverse effect of antineoplastic and immunosuppressive drugs, initial encounter: Secondary | ICD-10-CM | POA: Diagnosis not present

## 2019-04-13 DIAGNOSIS — Z9481 Bone marrow transplant status: Secondary | ICD-10-CM

## 2019-04-13 DIAGNOSIS — R5383 Other fatigue: Secondary | ICD-10-CM | POA: Diagnosis not present

## 2019-04-13 DIAGNOSIS — R0989 Other specified symptoms and signs involving the circulatory and respiratory systems: Secondary | ICD-10-CM | POA: Insufficient documentation

## 2019-04-13 DIAGNOSIS — Z881 Allergy status to other antibiotic agents status: Secondary | ICD-10-CM | POA: Insufficient documentation

## 2019-04-13 DIAGNOSIS — I700 Atherosclerosis of aorta: Secondary | ICD-10-CM | POA: Diagnosis not present

## 2019-04-13 DIAGNOSIS — R51 Headache: Secondary | ICD-10-CM | POA: Diagnosis not present

## 2019-04-13 DIAGNOSIS — D61818 Other pancytopenia: Secondary | ICD-10-CM | POA: Diagnosis not present

## 2019-04-13 DIAGNOSIS — Z886 Allergy status to analgesic agent status: Secondary | ICD-10-CM | POA: Insufficient documentation

## 2019-04-13 DIAGNOSIS — R030 Elevated blood-pressure reading, without diagnosis of hypertension: Secondary | ICD-10-CM | POA: Insufficient documentation

## 2019-04-13 DIAGNOSIS — Z79899 Other long term (current) drug therapy: Secondary | ICD-10-CM | POA: Insufficient documentation

## 2019-04-13 DIAGNOSIS — Z9484 Stem cells transplant status: Secondary | ICD-10-CM | POA: Insufficient documentation

## 2019-04-13 DIAGNOSIS — Z7189 Other specified counseling: Secondary | ICD-10-CM | POA: Insufficient documentation

## 2019-04-13 DIAGNOSIS — C9001 Multiple myeloma in remission: Secondary | ICD-10-CM

## 2019-04-13 DIAGNOSIS — Z23 Encounter for immunization: Secondary | ICD-10-CM | POA: Diagnosis not present

## 2019-04-13 DIAGNOSIS — Z885 Allergy status to narcotic agent status: Secondary | ICD-10-CM | POA: Insufficient documentation

## 2019-04-13 DIAGNOSIS — Z880 Allergy status to penicillin: Secondary | ICD-10-CM | POA: Diagnosis not present

## 2019-04-13 DIAGNOSIS — Z9221 Personal history of antineoplastic chemotherapy: Secondary | ICD-10-CM | POA: Insufficient documentation

## 2019-04-13 DIAGNOSIS — Z5112 Encounter for antineoplastic immunotherapy: Secondary | ICD-10-CM | POA: Insufficient documentation

## 2019-04-13 DIAGNOSIS — R531 Weakness: Secondary | ICD-10-CM | POA: Insufficient documentation

## 2019-04-13 DIAGNOSIS — E876 Hypokalemia: Secondary | ICD-10-CM | POA: Diagnosis not present

## 2019-04-13 DIAGNOSIS — R911 Solitary pulmonary nodule: Secondary | ICD-10-CM | POA: Insufficient documentation

## 2019-04-13 DIAGNOSIS — Z923 Personal history of irradiation: Secondary | ICD-10-CM | POA: Insufficient documentation

## 2019-04-13 DIAGNOSIS — R519 Headache, unspecified: Secondary | ICD-10-CM | POA: Insufficient documentation

## 2019-04-13 DIAGNOSIS — D631 Anemia in chronic kidney disease: Secondary | ICD-10-CM | POA: Insufficient documentation

## 2019-04-13 NOTE — Assessment & Plan Note (Signed)
She has not been to [facility] since last year We discussed the risk and benefits of consideration for repeat bone marrow transplant

## 2019-04-13 NOTE — Assessment & Plan Note (Signed)
We discussed goals of care The patient have relapsed disease She understood that treatment goals is palliative

## 2019-04-13 NOTE — Progress Notes (Signed)
[place] OFFICE PROGRESS NOTE  Patient Care Team: Josetta Huddle, MD as PCP - General (Internal Medicine) Ginette Pitman, MD as Consulting Physician (Hematology and Oncology) Hessie Dibble, MD as Consulting Physician (Hematology and Oncology)  ASSESSMENT & PLAN:  Multiple myeloma in remission [facility]) Unfortunately, myeloma panel for the last 3 times suggested early cancer relapse She has been having some intermittent headaches that comes and goes, nonspecific She denies bone pain I will order 24-hour urine collection along with PET CT scan for staging We discussed the risk and benefits of pursuing further evaluation at [facility] and to see whether she is a candidate for repeat autologous stem cell transplant versus clinical trial She is undecided We will proceed with investigation as above and I plan to see her back the week after next with test results We discussed treatment options briefly including potential treatment with daratumumab or resumption of Velcade based therapy  Headache disorder She has occasional intermittent headache at the same location as her prior radiation on the right temple area I will order a PET CT scan as above to properly stage her disease  S/P bone marrow transplant [facility]) She has not been to [facility] since last year We discussed the risk and benefits of consideration for repeat bone marrow transplant  Goals of care, counseling/discussion We discussed goals of care The patient have relapsed disease She understood that treatment goals is palliative   Orders Placed This Encounter  Procedures  . NM PET Image Initial (PI) Whole Body    Standing Status:   Future    Standing Expiration Date:   04/12/2020    Order Specific Question:   If indicated for the ordered procedure, I authorize the administration of a radiopharmaceutical per Radiology protocol    Answer:   Yes    Order Specific Question:   Preferred  imaging location?    Answer:   [facility]    Order Specific Question:   Radiology Contrast Protocol - do NOT remove file path    Answer:   [email or web address]  . UPEP/UIFE/Light Chains/TP, 24-Hr Ur    Standing Status:   Future    Standing Expiration Date:   05/17/2020    INTERVAL HISTORY: Please see below for problem oriented charting. She returns for myeloma follow-up She has been complaining of intermittent headaches and fatigue since last time I saw her No specific bone pain No recent infection, fever or chills  SUMMARY OF ONCOLOGIC HISTORY: Oncology History Overview Note   M-protein 0.69 gm/dl IFIX - IgG, Kappa IgG - 868 IgA - 19 IgM - < 20 Kappa - 21 Lambda - 5.7  09/06/2014 - Bone marrow aspirate and biopsy:   Normocellular marrow for age (40%) with a small monoclonal plasma cell population (1% on aspirate). Karyotype 31, XX  FISH Negative for myeloma associated changes  09/12/2014 - PET/CT  Two regions that are concerning for disease, one adjacent/involving the left ninth rib and one in the marrow of the right femur, in this patient with history of plasmacytoma.    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   04/01/2015 Bone Marrow Transplant   She received melphalan chemotherapy on 03/31/2015 followed by autologous stem cell transplant the day after   04/03/2015 - 04/18/2015 Hospital Admission   The patient was admitted to the hospital at [place] for management related to complication from stem cell transplant. She had significant nausea requiring intravenous anti-emetics.   07/17/2015 -  Chemotherapy   She started maintenance Revlimid and monthly zometa, then every 3 months   06/01/2018 Imaging   DEXA scan showed bone density T score in femur -2.3     REVIEW OF SYSTEMS:   Constitutional: Denies fevers, chills or abnormal weight loss Eyes: Denies blurriness of vision Ears, nose, mouth, throat, and face: Denies mucositis or sore throat Respiratory: Denies cough, dyspnea or wheezes Cardiovascular: Denies palpitation, chest discomfort or lower extremity swelling Gastrointestinal:  Denies nausea, heartburn or change in bowel habits Skin: Denies abnormal skin rashes Lymphatics: Denies new lymphadenopathy or easy bruising Neurological:Denies numbness, tingling or new weaknesses Behavioral/Psych: Mood is stable, no new changes  All other systems were reviewed with the patient and are negative.  I have reviewed the past medical history, past surgical history, social history and family history with the patient and they are unchanged from previous note.  ALLERGIES:  is allergic to augmentin [amoxicillin-pot clavulanate]; albuterol; codeine; ibuprofen; nsaids; and tolmetin.  MEDICATIONS:  Current Outpatient Medications  Medication Sig Dispense Refill  . acetaminophen (TYLENOL) 500 MG tablet Take 500-1,000 mg by mouth every 6 (six) hours as needed for mild pain, moderate pain, fever or  headache.    Marland Kitchen acyclovir (ZOVIRAX) 400 MG tablet Take 1 tablet (400 mg total) by mouth 2 (two) times daily. 180 tablet 3  . aspirin EC 81 MG tablet Take 81 mg by mouth daily with breakfast.    . calcium carbonate (OSCAL) 1500 (600 Ca) MG TABS tablet Take 1,500 mg by mouth daily with breakfast.    . Calcium Carbonate 500 MG CHEW Chew 500 mg by mouth as needed for indigestion.    . Cholecalciferol (VITAMIN D3) 2000 units capsule Take 2,000 Units by mouth daily.     Marland Kitchen HYDROcodone-acetaminophen (NORCO/VICODIN) 5-325 MG tablet Take 1 tablet by mouth every 6 (six) hours as needed for moderate pain. 10 tablet 0  . lenalidomide (REVLIMID) 5 MG capsule Take 1 capsule (5 mg total) by mouth daily. 21 days on and 7 days off. 21 capsule 11  . Multiple Vitamins-Minerals (CENTRUM SILVER PO) Take 1 tablet by mouth daily.     Marland Kitchen omeprazole (PRILOSEC) 20 MG capsule TAKE 1 CAPSULE(20 MG) BY MOUTH DAILY (Patient taking differently: Take 20 mg by mouth daily. ) 90 capsule 9  . ondansetron (ZOFRAN ODT) 4 MG disintegrating tablet Take 1 tablet (4 mg total) by mouth every 8 (eight) hours as needed for nausea or vomiting. 12  tablet 1  . ondansetron (ZOFRAN) 8 MG tablet Take 1 tablet (8 mg total) by mouth every 8 (eight) hours as needed for nausea or vomiting. 60 tablet 1  . senna-docusate (SENOKOT-S) 8.6-50 MG tablet Take 1-2 tablets by mouth daily as needed for mild constipation or moderate constipation.     . sodium chloride (OCEAN) 0.65 % SOLN nasal spray Place 1 spray into both nostrils as needed for congestion.     No current facility-administered medications for this visit.     PHYSICAL EXAMINATION: ECOG PERFORMANCE STATUS: 1 - Symptomatic but completely ambulatory  Vitals:   04/13/19 1300  BP: (!) 151/98  Pulse: 78  Resp: 18  Temp: 98.7 F (37.1 C)  SpO2: 95%   Filed Weights   04/13/19 1300  Weight: 104 lb 11.2 oz (47.5 kg)    GENERAL:alert, no distress and comfortable Musculoskeletal:no cyanosis of  digits and no clubbing  NEURO: alert & oriented x 3 with fluent speech, no focal motor/sensory deficits  LABORATORY DATA:  I have reviewed the data as listed    Component Value Date/Time   NA 141 04/06/2019 1343   NA 143 07/14/2017 1245   K 3.9 04/06/2019 1343   K 3.3 (L) 07/14/2017 1245   CL 109 04/06/2019 1343   CO2 23 04/06/2019 1343   CO2 20 (L) 07/14/2017 1245   GLUCOSE 111 (H) 04/06/2019 1343   GLUCOSE 106 07/14/2017 1245   BUN 9 04/06/2019 1343   BUN 9.3 07/14/2017 1245   CREATININE 0.83 04/06/2019 1343   CREATININE 0.88 01/04/2019 1058   CREATININE 0.8 07/14/2017 1245   CALCIUM 8.4 (L) 04/06/2019 1343   CALCIUM 8.4 07/14/2017 1245   PROT 6.4 (L) 04/06/2019 1343   PROT 6.0 07/14/2017 1245   PROT 6.2 (L) 07/14/2017 1245   ALBUMIN 3.0 (L) 04/06/2019 1343   ALBUMIN 3.6 07/14/2017 1245   AST 16 04/06/2019 1343   AST 22 01/04/2019 1058   AST 21 07/14/2017 1245   ALT 12 04/06/2019 1343   ALT 18 01/04/2019 1058   ALT 21 07/14/2017 1245   ALKPHOS 102 04/06/2019 1343   ALKPHOS 78 07/14/2017 1245   BILITOT 0.3 04/06/2019 1343   BILITOT 0.5 01/04/2019 1058   BILITOT 0.43 07/14/2017 1245   GFRNONAA >60 04/06/2019 1343   GFRNONAA >60 01/04/2019 1058   GFRAA >60 04/06/2019 1343   GFRAA >60 01/04/2019 1058    No results found for: SPEP, UPEP  Lab Results  Component Value Date   WBC 4.1 04/06/2019   NEUTROABS 2.1 04/06/2019   HGB 11.0 (L) 04/06/2019   HCT 33.9 (L) 04/06/2019   MCV 87.8 04/06/2019   PLT 374 04/06/2019      Chemistry      Component Value Date/Time   NA 141 04/06/2019 1343   NA 143 07/14/2017 1245   K 3.9 04/06/2019 1343   K 3.3 (L) 07/14/2017 1245   CL 109 04/06/2019 1343   CO2 23 04/06/2019 1343   CO2 20 (L) 07/14/2017 1245   BUN 9 04/06/2019 1343   BUN 9.3 07/14/2017 1245   CREATININE 0.83 04/06/2019 1343   CREATININE 0.88 01/04/2019 1058   CREATININE 0.8 07/14/2017 1245      Component Value Date/Time   CALCIUM 8.4 (L) 04/06/2019 1343    CALCIUM 8.4 07/14/2017 1245   ALKPHOS 102 04/06/2019 1343   ALKPHOS 78 07/14/2017 1245   AST 16 04/06/2019 1343   AST 22 01/04/2019 1058   AST 21 07/14/2017 1245  ALT 12 04/06/2019 1343   ALT 18 01/04/2019 1058   ALT 21 07/14/2017 1245   BILITOT 0.3 04/06/2019 1343   BILITOT 0.5 01/04/2019 1058   BILITOT 0.43 07/14/2017 1245       RADIOGRAPHIC STUDIES: I have personally reviewed the radiological images as listed and agreed with the findings in the report. Dg Chest 2 View  Result Date: 03/30/2019 CLINICAL DATA:  [place]. EXAM: CHEST - 2 VIEW COMPARISON:  CT scan and radiographs of October 05, 2018. FINDINGS: The heart size and mediastinal contours are within normal limits. Both lungs are clear. No pneumothorax or pleural effusion is noted. The visualized skeletal structures are unremarkable. IMPRESSION: No active cardiopulmonary disease. Electronically Signed   By: Marijo Conception M.D.   On: 03/30/2019 11:58    All questions were answered. The patient knows to call the clinic with any problems, questions or concerns. No barriers to learning was detected.  I spent 30 minutes counseling the patient face to face. The total time spent in the appointment was 40 minutes and more than 50% was on counseling and review of test results  [name], MD 04/13/2019 3:43 PM

## 2019-04-13 NOTE — Assessment & Plan Note (Signed)
Unfortunately, myeloma panel for the last 3 times suggested early cancer relapse She has been having some intermittent headaches that comes and goes, nonspecific She denies bone pain I will order 24-hour urine collection along with PET CT scan for staging We discussed the risk and benefits of pursuing further evaluation at [facility] and to see whether she is a candidate for repeat autologous stem cell transplant versus clinical trial She is undecided We will proceed with investigation as above and I plan to see her back the week after next with test results We discussed treatment options briefly including potential treatment with daratumumab or resumption of Velcade based therapy

## 2019-04-13 NOTE — Assessment & Plan Note (Signed)
She has occasional intermittent headache at the same location as her prior radiation on the right temple area I will order a PET CT scan as above to properly stage her disease

## 2019-04-13 NOTE — Telephone Encounter (Signed)
Scheduled appt per 9/4 sch message - pt is aware of apt date and time

## 2019-04-17 ENCOUNTER — Other Ambulatory Visit: Payer: Self-pay

## 2019-04-17 ENCOUNTER — Inpatient Hospital Stay: Payer: Medicare Other

## 2019-04-17 DIAGNOSIS — Z5112 Encounter for antineoplastic immunotherapy: Secondary | ICD-10-CM | POA: Diagnosis not present

## 2019-04-17 DIAGNOSIS — C9001 Multiple myeloma in remission: Secondary | ICD-10-CM

## 2019-04-18 LAB — UPEP/UIFE/LIGHT CHAINS/TP, 24-HR UR
% BETA, Urine: 11.3 %
ALPHA 1 URINE: 4.9 %
Albumin, U: 76 %
Alpha 2, Urine: 2.5 %
Free Kappa Lt Chains,Ur: 13.52 mg/L (ref 0.63–113.79)
Free Kappa/Lambda Ratio: 6.41 (ref 1.03–31.76)
Free Lambda Lt Chains,Ur: 2.11 mg/L (ref 0.47–11.77)
GAMMA GLOBULIN URINE: 5.2 %
Total Protein, Urine-Ur/day: 210 mg/24 hr — ABNORMAL HIGH (ref 30–150)
Total Protein, Urine: 23.3 mg/dL
Total Volume: 900

## 2019-04-20 ENCOUNTER — Other Ambulatory Visit: Payer: Self-pay

## 2019-04-20 ENCOUNTER — Ambulatory Visit (HOSPITAL_COMMUNITY)
Admission: RE | Admit: 2019-04-20 | Discharge: 2019-04-20 | Disposition: A | Discharge status: Home / Self Care | Payer: Medicare Other | Source: Ambulatory Visit | Attending: Hematology and Oncology | Admitting: Hematology and Oncology

## 2019-04-20 DIAGNOSIS — Z79899 Other long term (current) drug therapy: Secondary | ICD-10-CM | POA: Diagnosis not present

## 2019-04-20 DIAGNOSIS — C9001 Multiple myeloma in remission: Secondary | ICD-10-CM

## 2019-04-20 DIAGNOSIS — R918 Other nonspecific abnormal finding of lung field: Secondary | ICD-10-CM | POA: Insufficient documentation

## 2019-04-20 LAB — GLUCOSE, CAPILLARY: Glucose-Capillary: 77 mg/dL (ref 70–99)

## 2019-04-20 MED ORDER — FLUDEOXYGLUCOSE F - 18 (FDG) INJECTION
5.2000 | Freq: Once | INTRAVENOUS | Status: AC
  Administered 2019-04-20: 5.2 via INTRAVENOUS

## 2019-04-24 ENCOUNTER — Other Ambulatory Visit: Payer: Self-pay | Admitting: Hematology and Oncology

## 2019-04-24 ENCOUNTER — Inpatient Hospital Stay (HOSPITAL_BASED_OUTPATIENT_CLINIC_OR_DEPARTMENT_OTHER): Payer: Medicare Other | Admitting: Hematology and Oncology

## 2019-04-24 ENCOUNTER — Inpatient Hospital Stay: Payer: Medicare Other

## 2019-04-24 ENCOUNTER — Other Ambulatory Visit: Payer: Self-pay

## 2019-04-24 ENCOUNTER — Telehealth: Payer: Self-pay | Admitting: Hematology and Oncology

## 2019-04-24 VITALS — BP 121/88 | HR 84 | Temp 98.7°F | Resp 18 | Ht 60.0 in | Wt 107.0 lb

## 2019-04-24 DIAGNOSIS — Z23 Encounter for immunization: Secondary | ICD-10-CM

## 2019-04-24 DIAGNOSIS — E876 Hypokalemia: Secondary | ICD-10-CM

## 2019-04-24 DIAGNOSIS — C9001 Multiple myeloma in remission: Secondary | ICD-10-CM | POA: Diagnosis not present

## 2019-04-24 DIAGNOSIS — D638 Anemia in other chronic diseases classified elsewhere: Secondary | ICD-10-CM

## 2019-04-24 DIAGNOSIS — Z9481 Bone marrow transplant status: Secondary | ICD-10-CM

## 2019-04-24 DIAGNOSIS — Z7189 Other specified counseling: Secondary | ICD-10-CM

## 2019-04-24 DIAGNOSIS — Z5112 Encounter for antineoplastic immunotherapy: Secondary | ICD-10-CM | POA: Diagnosis not present

## 2019-04-24 LAB — CBC WITH DIFFERENTIAL (CANCER CENTER ONLY)
Abs Immature Granulocytes: 0.02 10*3/uL (ref 0.00–0.07)
Basophils Absolute: 0.1 10*3/uL (ref 0.0–0.1)
Basophils Relative: 2 %
Eosinophils Absolute: 0.6 10*3/uL — ABNORMAL HIGH (ref 0.0–0.5)
Eosinophils Relative: 13 %
HCT: 32.1 % — ABNORMAL LOW (ref 36.0–46.0)
Hemoglobin: 10.4 g/dL — ABNORMAL LOW (ref 12.0–15.0)
Immature Granulocytes: 0 %
Lymphocytes Relative: 27 %
Lymphs Abs: 1.3 10*3/uL (ref 0.7–4.0)
MCH: 29 pg (ref 26.0–34.0)
MCHC: 32.4 g/dL (ref 30.0–36.0)
MCV: 89.4 fL (ref 80.0–100.0)
Monocytes Absolute: 0.4 10*3/uL (ref 0.1–1.0)
Monocytes Relative: 9 %
Neutro Abs: 2.4 10*3/uL (ref 1.7–7.7)
Neutrophils Relative %: 49 %
Platelet Count: 255 10*3/uL (ref 150–400)
RBC: 3.59 MIL/uL — ABNORMAL LOW (ref 3.87–5.11)
RDW: 15.5 % (ref 11.5–15.5)
WBC Count: 4.8 10*3/uL (ref 4.0–10.5)
nRBC: 0 % (ref 0.0–0.2)

## 2019-04-24 LAB — CMP (CANCER CENTER ONLY)
ALT: 8 U/L (ref 0–44)
AST: 15 U/L (ref 15–41)
Albumin: 3.4 g/dL — ABNORMAL LOW (ref 3.5–5.0)
Alkaline Phosphatase: 74 U/L (ref 38–126)
Anion gap: 6 (ref 5–15)
BUN: 5 mg/dL — ABNORMAL LOW (ref 8–23)
CO2: 31 mmol/L (ref 22–32)
Calcium: 8.1 mg/dL — ABNORMAL LOW (ref 8.9–10.3)
Chloride: 106 mmol/L (ref 98–111)
Creatinine: 0.95 mg/dL (ref 0.44–1.00)
GFR, Est AFR Am: >60 mL/min (ref >60)
GFR, Estimated: >60 mL/min (ref >60)
Glucose, Bld: 87 mg/dL (ref 70–99)
Potassium: 2.7 mmol/L — CL (ref 3.5–5.1)
Sodium: 143 mmol/L (ref 135–145)
Total Bilirubin: 0.7 mg/dL (ref 0.3–1.2)
Total Protein: 5.9 g/dL — ABNORMAL LOW (ref 6.5–8.1)

## 2019-04-24 LAB — URIC ACID: Uric Acid, Serum: 3.3 mg/dL (ref 2.5–7.1)

## 2019-04-24 LAB — LACTATE DEHYDROGENASE: LDH: 139 U/L (ref 98–192)

## 2019-04-24 LAB — MAGNESIUM: Magnesium: 1.5 mg/dL — ABNORMAL LOW (ref 1.7–2.4)

## 2019-04-24 MED ORDER — INFLUENZA VAC A&B SA ADJ QUAD 0.5 ML IM PRSY
PREFILLED_SYRINGE | INTRAMUSCULAR | Status: AC
  Filled 2019-04-24: qty 0.5

## 2019-04-24 MED ORDER — INFLUENZA VAC A&B SA ADJ QUAD 0.5 ML IM PRSY
0.5000 mL | PREFILLED_SYRINGE | INTRAMUSCULAR | Status: AC
  Administered 2019-04-24: 0.5 mL via INTRAMUSCULAR

## 2019-04-24 NOTE — Progress Notes (Signed)
START ON PATHWAY REGIMEN - Multiple Myeloma and Other Plasma Cell Dyscrasias     Cycles 1 through 3: A cycle is every 21 days:     Dexamethasone      Bortezomib      Daratumumab    Cycles 4 through 8: A cycle is every 21 days:     Dexamethasone      Bortezomib      Daratumumab    Cycles 9 and beyond: A cycle is every 28 days:     Daratumumab   **Always confirm dose/schedule in your pharmacy ordering system**  Patient Characteristics: Relapsed / Refractory, Second through Fourth Lines of Therapy R-ISS Staging: III Disease Classification: Relapsed Line of Therapy: Second Line Intent of Therapy: Curative Intent, Not Discussed with Patient

## 2019-04-24 NOTE — Telephone Encounter (Signed)
I talk with patient regarding schedule. Also scheduled according to MD and desk nurse. Melanie instructed as well

## 2019-04-25 ENCOUNTER — Other Ambulatory Visit: Payer: Self-pay | Admitting: Hematology and Oncology

## 2019-04-25 ENCOUNTER — Telehealth: Payer: Self-pay | Admitting: *Deleted

## 2019-04-25 ENCOUNTER — Encounter: Payer: Self-pay | Admitting: Hematology and Oncology

## 2019-04-25 LAB — TYPE AND SCREEN
ABO/RH(D): O POS
Antibody Screen: NEGATIVE

## 2019-04-25 LAB — PRETREATMENT RBC PHENOTYPE

## 2019-04-25 MED ORDER — POTASSIUM CHLORIDE CRYS ER 20 MEQ PO TBCR: 20.0000 meq | EXTENDED_RELEASE_TABLET | Freq: Every day | ORAL | 1 refills | Status: DC

## 2019-04-25 MED ORDER — MAGNESIUM OXIDE 400 (241.3 MG) MG PO TABS: 400.0000 mg | ORAL_TABLET | Freq: Every day | ORAL | 1 refills | Status: DC

## 2019-04-25 NOTE — Telephone Encounter (Signed)
Telephone call to patient and advised lab results as directed below. Patient repeated directions for medications back to RN. Discussed food options for increasing potassium in her diet. Patient agrees with plan and confirms appt Monday for treatment.

## 2019-04-25 NOTE — Assessment & Plan Note (Signed)
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  Observe  

## 2019-04-25 NOTE — Telephone Encounter (Signed)
-----   Message from [name], MD sent at 04/25/2019  8:10 AM EDT ----- Regarding: low mag and potassium I sent prescription to her pharmacy Advise her to take magnesium first before potassium and recommend potassium rich diet

## 2019-04-25 NOTE — Progress Notes (Signed)
[place] OFFICE PROGRESS NOTE  Patient Care Team: Josetta Huddle, MD as PCP - General (Internal Medicine) Ginette Pitman, MD as Consulting Physician (Hematology and Oncology) Hessie Dibble, MD as Consulting Physician (Hematology and Oncology)  ASSESSMENT & PLAN:  Multiple myeloma in remission [facility]) I have reviewed PET CT scan with the patient and given her copies of test results We discussed treatment options We discussed referral back to [facility] but the patient declined I reviewed the current NCCN guidelines with the patient.  The decision was made based on recent publication on NEJM and is a category 1 recommendation in NCCN guideline Role of treatment is palliative.  Daratumumab, Bortezomib, and Dexamethasone for Multiple Myeloma Lucienne Capers, M.D., Rulon Abide, M.D., Christophe Louis, M.D., Kern Reap. Luciana Axe, M.D., Miquel Dunn, M.D., Rolanda Jay, M.D., Mickel Crow, M.D., Desma Paganini, M.D., Charmayne Sheer, M.D., Nona Dell, M.D., Docia Chuck, M.D., Magda Kiel, M.D., Martinique Schecter, M.D., [place], B.S., Brayton Caves, M.S., Rande Lawman, Ph.D., Lynetta Mare, M.D., Judie Bonus, M.D., and Lilian Kapur, M.D., for the [employer] [place] [employer]*  [employer] [facility] [phone]. DOI: 10.1056/[id number]  In this phase 3 trial, 498 patients with relapsed or relapsed and refractory multiple myeloma was randomized to receive bortezomib (1.3 mg per square meter of body surface area) and dexamethasone (20 mg) alone (control group) or in combination with daratumumab (16 mg per kilogram of body weight) (daratumumab group). The primary end point was progression-free survival.  RESULTS A prespecified interim analysis showed that the rate of progression-free survival was significantly higher in the daratumumab group than in the control group; the 62-monthrate of progression-free survival was 60.7% in the daratumumab group versus 26.9% in the control  group. After a median follow-up period of 7.4 months, the median progression-free survival was not reached in the daratumumab group and was 7.2 months in the control group (hazard ratio for progression or death with daratumumab vs. control, 0.39; 95% confidence interval, 0.28 to 0.53; P<0.001). The rate of overall response was higher in the daratumumab group than in the control group (82.9% vs. 63.2%, P<0.001), as were the rates of very good partial response or better (59.2% vs. 29.1%, P<0.001) and complete response or better (19.2% vs. 9.0%, P = 0.001). Three of the most common grade 3 or 4 adverse events reported in the daratumumab group and the control group were thrombocytopenia (45.3% and 32.9%, respectively), anemia (14.4% and 16.0%, respectively), and neutropenia (12.8% and 4.2%, respectively). Infusion-related reactions that were associated with daratumumab treatment were reported in 45.3% of the patients in the daratumumab group; these reactions were mostly grade 1 or 2 (grade 3 in 8.6% of the patients), and in 98.2% of these patients, they occurred during the first infusion.  Some of the short term & long term side-effects included, though not limited to, risk of fatigue, weight loss, risk of allergic reactions, pancytopenia, permanent damage to nerve function, life-threatening infections, need for transfusions of blood products, change in bowel habits, admission to hospital for various reasons, and risks of death.   The patient is aware that the response rates discussed earlier is not guaranteed.    After a long discussion, patient made an informed decision to proceed with the prescribed plan of care.   We also reviewed other treatment options including Ninlaro, dex with Pomalyst or Velcade, dex with Pomalyst. 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  Observe  Hypokalemia She has profound hypokalemia I recommend magnesium and potassium replacement therapy  Goals of care, counseling/discussion We discussed goals of care The patient have relapsed disease She understood that treatment goals is palliative   Orders Placed This Encounter  Procedures  . CBC with Differential (Cancer Center Only)    Standing Status:   Standing    Number of Occurrences:   20    Standing Expiration Date:   04/23/2020  . CMP ([facility] only)    Standing Status:   Standing    Number of Occurrences:   20    Standing Expiration Date:   04/23/2020  . Pretreatment RBC phenotype    Standing Status:   Future    Number of Occurrences:   1    Standing Expiration Date:   04/23/2020  . Uric acid    Standing Status:   Future    Number of Occurrences:   1    Standing Expiration Date:   04/23/2020  . Lactate dehydrogenase    Standing Status:   Future    Number of Occurrences:   1    Standing Expiration Date:   04/23/2020  . Magnesium    Standing Status:   Standing    Number of Occurrences:   3    Standing Expiration Date:   04/24/2020  . Type and screen    Obtain prior to daratumumab treatment.    Standing Status:   Future    Number of Occurrences:   1    Standing Expiration Date:   04/23/2020    INTERVAL HISTORY: Please see below for problem oriented charting. She returns for further follow-up Her husband is also available over the phone She complains of some mild weakness but denies bone pain  SUMMARY OF ONCOLOGIC HISTORY: Oncology History Overview Note   M-protein 0.69 gm/dl  IFIX - IgG, Kappa IgG - 868 IgA - 19 IgM - < 20 Kappa - 21 Lambda - 5.7  09/06/2014 - Bone marrow aspirate and biopsy:   Normocellular marrow for age (40%) with a small monoclonal plasma cell population (1% on aspirate). Karyotype 69, XX  FISH Negative for myeloma associated changes  09/12/2014 - PET/CT  Two regions that are concerning for disease, one adjacent/involving the left ninth rib and one in the marrow of the right femur, in this patient with history of plasmacytoma.    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   04/01/2015 Bone Marrow Transplant   She received melphalan chemotherapy on 03/31/2015 followed by autologous stem cell transplant the day after   04/03/2015 - 04/18/2015 Hospital Admission   The patient was admitted to the hospital at [facility] for management related to complication from stem cell transplant. She had significant nausea requiring intravenous anti-emetics.   07/17/2015 -  Chemotherapy   She started maintenance  Revlimid and monthly zometa, then every 3 months   06/01/2018 Imaging   DEXA scan showed bone density T score in femur -2.3   04/20/2019 PET scan   1. No FDG avid osseous lesions or mass identified to suggest metabolically active lesion of myeloma or plasmacytoma. 2. Small nodular density within the paravertebral right lower lobe exhibits mild to moderate increased uptake within SUV max of 3.38. This is indeterminate. Review of CT chest from 10/05/2018 shows a corresponding Lung nodule in this area measuring the same. Small pulmonary neoplasm cannot be excluded. 3. Indeterminate, focal area of increased uptake is identified within the thoracic canal. Indeterminate favored to represent benign physiologic CNS activity.     04/30/2019 -  Chemotherapy   The patient had daratumumab-hyaluronidase-fihj (DARZALEX FASPRO) 1800-30000 MG-UT/15ML chemo SQ injection 1,800 mg, 1,800 mg, Subcutaneous,  Once, 0 of 9 cycles  for chemotherapy treatment.    04/30/2019 -  Chemotherapy   The patient had bortezomib SQ (VELCADE) chemo injection 1.75 mg, 1.3 mg/m2 = 1.75 mg, Subcutaneous,  Once, 0 of 8 cycles  for chemotherapy treatment.      REVIEW OF SYSTEMS:   Constitutional: Denies fevers, chills or abnormal weight loss Eyes: Denies blurriness of vision Ears, nose, mouth, throat, and face: Denies mucositis or sore throat Respiratory: Denies cough, dyspnea or wheezes Cardiovascular: Denies palpitation, chest discomfort or lower extremity swelling Gastrointestinal:  Denies nausea, heartburn or change in bowel habits Skin: Denies abnormal skin rashes Lymphatics: Denies new lymphadenopathy or easy bruising Neurological:Denies numbness, tingling or new weaknesses Behavioral/Psych: Mood is stable, no new changes  All other systems were reviewed with the patient and are negative.  I have reviewed the past medical history, past surgical history, social history and family history with the patient and they are  unchanged from previous note.  ALLERGIES:  is allergic to augmentin [amoxicillin-pot clavulanate]; albuterol; codeine; ibuprofen; nsaids; and tolmetin.  MEDICATIONS:  Current Outpatient Medications  Medication Sig Dispense Refill  . acetaminophen (TYLENOL) 500 MG tablet Take 500-1,000 mg by mouth every 6 (six) hours as needed for mild pain, moderate pain, fever or headache.    Marland Kitchen acyclovir (ZOVIRAX) 400 MG tablet Take 1 tablet (400 mg total) by mouth 2 (two) times daily. 180 tablet 3  . aspirin EC 81 MG tablet Take 81 mg by mouth daily with breakfast.    . calcium carbonate (OSCAL) 1500 (600 Ca) MG TABS tablet Take 1,500 mg by mouth daily with breakfast.    . Calcium Carbonate 500 MG CHEW Chew 500 mg by mouth as needed for indigestion.    . Cholecalciferol (VITAMIN D3) 2000 units capsule Take 2,000 Units by mouth daily.     Marland Kitchen HYDROcodone-acetaminophen (NORCO/VICODIN) 5-325 MG  tablet Take 1 tablet by mouth every 6 (six) hours as needed for moderate pain. 10 tablet 0  . magnesium oxide (MAG-OX) 400 (241.3 Mg) MG tablet Take 1 tablet (400 mg total) by mouth daily. 30 tablet 1  . Multiple Vitamins-Minerals (CENTRUM SILVER PO) Take 1 tablet by mouth daily.     Marland Kitchen omeprazole (PRILOSEC) 20 MG capsule TAKE 1 CAPSULE(20 MG) BY MOUTH DAILY (Patient taking differently: Take 20 mg by mouth daily. ) 90 capsule 9  . ondansetron (ZOFRAN ODT) 4 MG disintegrating tablet Take 1 tablet (4 mg total) by mouth every 8 (eight) hours as needed for nausea or vomiting. 12 tablet 1  . ondansetron (ZOFRAN) 8 MG tablet Take 1 tablet (8 mg total) by mouth every 8 (eight) hours as needed for nausea or vomiting. 60 tablet 1  . potassium chloride SA (K-DUR) 20 MEQ tablet Take 1 tablet (20 mEq total) by mouth daily. 30 tablet 1  . senna-docusate (SENOKOT-S) 8.6-50 MG tablet Take 1-2 tablets by mouth daily as needed for mild constipation or moderate constipation.     . sodium chloride (OCEAN) 0.65 % SOLN nasal spray Place 1 spray  into both nostrils as needed for congestion.     No current facility-administered medications for this visit.     PHYSICAL EXAMINATION: ECOG PERFORMANCE STATUS: 1 - Symptomatic but completely ambulatory  Vitals:   04/24/19 1142  BP: 121/88  Pulse: 84  Resp: 18  Temp: 98.7 F (37.1 C)  SpO2: 100%   Filed Weights   04/24/19 1142  Weight: 107 lb (48.5 kg)    GENERAL:alert, no distress and comfortable SKIN: skin color, texture, turgor are normal, no rashes or significant lesions EYES: normal, Conjunctiva are pink and non-injected, sclera clear OROPHARYNX:no exudate, no erythema and lips, buccal mucosa, and tongue normal  NECK: supple, thyroid normal size, non-tender, without nodularity LYMPH:  no palpable lymphadenopathy in the cervical, axillary or inguinal LUNGS: clear to auscultation and percussion with normal breathing effort HEART: regular rate & rhythm and no murmurs and no lower extremity edema ABDOMEN:abdomen soft, non-tender and normal bowel sounds Musculoskeletal:no cyanosis of digits and no clubbing  NEURO: alert & oriented x 3 with fluent speech, no focal motor/sensory deficits  LABORATORY DATA:  I have reviewed the data as listed    Component Value Date/Time   NA 143 04/24/2019 1219   NA 143 07/14/2017 1245   K 2.7 (LL) 04/24/2019 1219   K 3.3 (L) 07/14/2017 1245   CL 106 04/24/2019 1219   CO2 31 04/24/2019 1219   CO2 20 (L) 07/14/2017 1245   GLUCOSE 87 04/24/2019 1219   GLUCOSE 106 07/14/2017 1245   BUN 5 (L) 04/24/2019 1219   BUN 9.3 07/14/2017 1245   CREATININE 0.95 04/24/2019 1219   CREATININE 0.8 07/14/2017 1245   CALCIUM 8.1 (L) 04/24/2019 1219   CALCIUM 8.4 07/14/2017 1245   PROT 5.9 (L) 04/24/2019 1219   PROT 6.0 07/14/2017 1245   PROT 6.2 (L) 07/14/2017 1245   ALBUMIN 3.4 (L) 04/24/2019 1219   ALBUMIN 3.6 07/14/2017 1245   AST 15 04/24/2019 1219   AST 21 07/14/2017 1245   ALT 8 04/24/2019 1219   ALT 21 07/14/2017 1245   ALKPHOS 74  04/24/2019 1219   ALKPHOS 78 07/14/2017 1245   BILITOT 0.7 04/24/2019 1219   BILITOT 0.43 07/14/2017 1245   GFRNONAA >60 04/24/2019 1219   GFRAA >60 04/24/2019 1219    No results found for: SPEP, UPEP  Lab Results  Component Value Date   WBC 4.8 04/24/2019   NEUTROABS 2.4 04/24/2019   HGB 10.4 (L) 04/24/2019   HCT 32.1 (L) 04/24/2019   MCV 89.4 04/24/2019   PLT 255 04/24/2019      Chemistry      Component Value Date/Time   NA 143 04/24/2019 1219   NA 143 07/14/2017 1245   K 2.7 (LL) 04/24/2019 1219   K 3.3 (L) 07/14/2017 1245   CL 106 04/24/2019 1219   CO2 31 04/24/2019 1219   CO2 20 (L) 07/14/2017 1245   BUN 5 (L) 04/24/2019 1219   BUN 9.3 07/14/2017 1245   CREATININE 0.95 04/24/2019 1219   CREATININE 0.8 07/14/2017 1245      Component Value Date/Time   CALCIUM 8.1 (L) 04/24/2019 1219   CALCIUM 8.4 07/14/2017 1245   ALKPHOS 74 04/24/2019 1219   ALKPHOS 78 07/14/2017 1245   AST 15 04/24/2019 1219   AST 21 07/14/2017 1245   ALT 8 04/24/2019 1219   ALT 21 07/14/2017 1245   BILITOT 0.7 04/24/2019 1219   BILITOT 0.43 07/14/2017 1245       RADIOGRAPHIC STUDIES: I have personally reviewed the radiological images as listed and agreed with the findings in the report. Dg Chest 2 View  Result Date: 03/30/2019 CLINICAL DATA:  [place]. EXAM: CHEST - 2 VIEW COMPARISON:  CT scan and radiographs of October 05, 2018. FINDINGS: The heart size and mediastinal contours are within normal limits. Both lungs are clear. No pneumothorax or pleural effusion is noted. The visualized skeletal structures are unremarkable. IMPRESSION: No active cardiopulmonary disease. Electronically Signed   By: Marijo Conception M.D.   On: 03/30/2019 11:58   Nm Pet Image Initial (pi) Whole Body  Result Date: 04/20/2019 CLINICAL DATA:  Subsequent treatment strategy for myeloma. EXAM: NUCLEAR MEDICINE PET WHOLE BODY TECHNIQUE: 5.2 mCi F-18 FDG was injected intravenously. Full-ring PET imaging was performed  from the skull base to thigh after the radiotracer. CT data was obtained and used for attenuation correction and anatomic localization. Fasting blood glucose: 77 mg/dl COMPARISON:  CT chest, abdomen and pelvis from 10/05/2018 FINDINGS: Mediastinal blood pool activity: SUV max 2.03 HEAD/NECK: No hypermetabolic activity in the scalp. No hypermetabolic cervical lymph nodes. Incidental CT findings: none CHEST: No hypermetabolic mediastinal or hilar nodes. Small nodule within the paravertebral right lower lobe measuring 7 mm has an SUV max 3.38. Indeterminate. Bandlike area of increased uptake (SUV max 4.12 within the posterior right base corresponds to an area of focal ground-glass attenuation and subsegmental atelectasis, image 52/8. Likely inflammatory. Incidental CT findings: Aortic atherosclerosis. ABDOMEN/PELVIS: No abnormal hypermetabolic activity within the liver, pancreas, adrenal glands, or spleen. No hypermetabolic lymph nodes in the abdomen or pelvis. Incidental CT findings: none SKELETON: No focal hypermetabolic activity to suggest skeletal metastasis. Focal area of increased uptake within the lower thoracic canal is identified within SUV max of 4.31. Etiology indeterminate. Incidental CT findings: Previous right frontal craniotomy. EXTREMITIES: No abnormal hypermetabolic activity in the lower extremities. Incidental CT findings: none IMPRESSION: 1. No FDG avid osseous lesions or mass identified to suggest metabolically active lesion of myeloma or plasmacytoma. 2. Small nodular density within the paravertebral right lower lobe exhibits mild to moderate increased uptake within SUV max of 3.38. This is indeterminate. Review of CT chest from 10/05/2018 shows a corresponding Lung nodule in this area measuring the same. Small pulmonary neoplasm cannot be excluded. 3. Indeterminate, focal area of increased uptake is identified within the thoracic canal. Indeterminate favored  to represent benign physiologic CNS  activity. Electronically Signed   By: Kerby Moors M.D.   On: 04/20/2019 12:58    All questions were answered. The patient knows to call the clinic with any problems, questions or concerns. No barriers to learning was detected.  I spent 40 minutes counseling the patient face to face. The total time spent in the appointment was 55 minutes and more than 50% was on counseling and review of test results  [name], MD 04/25/2019 8:11 AM

## 2019-04-25 NOTE — Assessment & Plan Note (Signed)
I have reviewed PET CT scan with the patient and given her copies of test results We discussed treatment options We discussed referral back to [facility] but the patient declined I reviewed the current NCCN guidelines with the patient.  The decision was made based on recent publication on NEJM and is a category 1 recommendation in NCCN guideline Role of treatment is palliative.  Daratumumab, Bortezomib, and Dexamethasone for Multiple Myeloma Jessica [name], M.D., Rulon Abide, M.D., Christophe Louis, M.D., Kern Reap. Luciana Axe, M.D., Miquel Dunn, M.D., Rolanda Jay, M.D., Mickel Crow, M.D., Desma Paganini, M.D., Charmayne Sheer, M.D., Nona Dell, M.D., Docia Chuck, M.D., Magda Kiel, M.D., [place] Schecter, M.D., [place], B.S., Brayton Caves, M.S., Rande Lawman, Ph.D., Lynetta Mare, M.D., Judie Bonus, M.D., and Lilian Kapur, M.D., for the [employer] [employer]*  [employer] [phone]. DOI: 10.1056/[id number]  In this phase 3 trial, 498 patients with relapsed or relapsed and refractory multiple myeloma was randomized to receive bortezomib (1.3 mg per square meter of body surface area) and dexamethasone (20 mg) alone (control group) or in combination with daratumumab (16 mg per kilogram of body weight) (daratumumab group). The primary end point was progression-free survival.  RESULTS A prespecified interim analysis showed that the rate of progression-free survival was significantly higher in the daratumumab group than in the control group; the 64-monthrate of progression-free survival was 60.7% in the daratumumab group versus 26.9% in the control group. After a median follow-up period of 7.4 months, the median progression-free survival was not reached in the daratumumab group and was 7.2 months in the control group (hazard ratio for progression or death with daratumumab vs. control, 0.39; 95% confidence interval, 0.28 to 0.53; P<0.001). The rate of overall response was higher  in the daratumumab group than in the control group (82.9% vs. 63.2%, P<0.001), as were the rates of very good partial response or better (59.2% vs. 29.1%, P<0.001) and complete response or better (19.2% vs. 9.0%, P = 0.001). Three of the most common grade 3 or 4 adverse events reported in the daratumumab group and the control group were thrombocytopenia (45.3% and 32.9%, respectively), anemia (14.4% and 16.0%, respectively), and neutropenia (12.8% and 4.2%, respectively). Infusion-related reactions that were associated with daratumumab treatment were reported in 45.3% of the patients in the daratumumab group; these reactions were mostly grade 1 or 2 (grade 3 in 8.6% of the patients), and in 98.2% of these patients, they occurred during the first infusion.  Some of the short term & long term side-effects included, though not limited to, risk of fatigue, weight loss, risk of allergic reactions, pancytopenia, permanent damage to nerve function, life-threatening infections, need for transfusions of blood products, change in bowel habits, admission to hospital for various reasons, and risks of death.   The patient is aware that the response rates discussed earlier is not guaranteed.    After a long discussion, patient made an informed decision to proceed with the prescribed plan of care.   We also reviewed other treatment options including Ninlaro, dex with Pomalyst or Velcade, dex with Pomalyst. Recently, FDA has approved the use of subcutaneous daratumumab I recommend switching out intravenous daratumumab to subcutaneous daratumumab to avoid line placement We discussed the use of acyclovir for antimicrobial prophylaxis She will continue calcium with vitamin D along with Zometa every 3 to 6 months I will continue to check myeloma panel once a month Patient is aware that treatment is palliative

## 2019-04-25 NOTE — Assessment & Plan Note (Signed)
She has profound hypokalemia I recommend magnesium and potassium replacement therapy

## 2019-04-25 NOTE — Assessment & Plan Note (Signed)
We discussed goals of care The patient have relapsed disease She understood that treatment goals is palliative

## 2019-04-27 ENCOUNTER — Telehealth: Payer: Self-pay | Admitting: *Deleted

## 2019-04-27 ENCOUNTER — Other Ambulatory Visit: Payer: Self-pay | Admitting: Hematology and Oncology

## 2019-04-27 NOTE — Telephone Encounter (Signed)
Telephone call to patient to advise delayed prior authorization situation with the [facility]. Patient verbalized an understanding that the Velcade injection is approved but the daratumumab is still pending. She is aware she may only receive one injection on Monday.    Patient reports she just started her Mag and Potassium pills this evening. She forgot to have her husband pick them up. Again discussed the importance of eating a potassium rich diet. Patient verbalized an understanding and repeated back to me she will take tablets daily.

## 2019-04-27 NOTE — Telephone Encounter (Signed)
Per Dr. Alvy Bimler ok to treat Monday 9/21 with results from [facility] 9/15.

## 2019-04-30 ENCOUNTER — Other Ambulatory Visit: Payer: Self-pay

## 2019-04-30 ENCOUNTER — Inpatient Hospital Stay: Payer: Medicare Other

## 2019-04-30 VITALS — BP 146/96 | HR 64 | Temp 98.5°F | Resp 16

## 2019-04-30 DIAGNOSIS — Z7189 Other specified counseling: Secondary | ICD-10-CM

## 2019-04-30 DIAGNOSIS — C9001 Multiple myeloma in remission: Secondary | ICD-10-CM

## 2019-04-30 DIAGNOSIS — Z5112 Encounter for antineoplastic immunotherapy: Secondary | ICD-10-CM | POA: Diagnosis not present

## 2019-04-30 DIAGNOSIS — Z9481 Bone marrow transplant status: Secondary | ICD-10-CM

## 2019-04-30 MED ORDER — DARATUMUMAB-HYALURONIDASE-FIHJ 1800-30000 MG-UT/15ML ~~LOC~~ SOLN
1800.0000 mg | Freq: Once | SUBCUTANEOUS | Status: AC
  Administered 2019-04-30: 1800 mg via SUBCUTANEOUS
  Filled 2019-04-30: qty 15

## 2019-04-30 MED ORDER — ACETAMINOPHEN 325 MG PO TABS
650.0000 mg | ORAL_TABLET | Freq: Once | ORAL | Status: AC
  Administered 2019-04-30: 650 mg via ORAL

## 2019-04-30 MED ORDER — SODIUM CHLORIDE 0.9 % IV SOLN
Freq: Once | INTRAVENOUS | Status: DC
  Filled 2019-04-30: qty 250

## 2019-04-30 MED ORDER — BORTEZOMIB CHEMO SQ INJECTION 3.5 MG (2.5MG/ML)
1.3000 mg/m2 | Freq: Once | INTRAMUSCULAR | Status: AC
  Administered 2019-04-30: 1.75 mg via SUBCUTANEOUS
  Filled 2019-04-30: qty 0.7

## 2019-04-30 MED ORDER — ACETAMINOPHEN 325 MG PO TABS
ORAL_TABLET | ORAL | Status: AC
  Filled 2019-04-30: qty 2

## 2019-04-30 MED ORDER — PROCHLORPERAZINE MALEATE 10 MG PO TABS
10.0000 mg | ORAL_TABLET | Freq: Once | ORAL | Status: AC
  Administered 2019-04-30: 10 mg via ORAL

## 2019-04-30 MED ORDER — DIPHENHYDRAMINE HCL 25 MG PO CAPS
ORAL_CAPSULE | ORAL | Status: AC
  Filled 2019-04-30: qty 1

## 2019-04-30 MED ORDER — DEXAMETHASONE 4 MG PO TABS
20.0000 mg | ORAL_TABLET | Freq: Once | ORAL | Status: AC
  Administered 2019-04-30: 20 mg via ORAL

## 2019-04-30 MED ORDER — MONTELUKAST SODIUM 10 MG PO TABS
10.0000 mg | ORAL_TABLET | Freq: Once | ORAL | Status: AC
  Administered 2019-04-30: 10 mg via ORAL

## 2019-04-30 MED ORDER — DIPHENHYDRAMINE HCL 25 MG PO CAPS
25.0000 mg | ORAL_CAPSULE | Freq: Once | ORAL | Status: AC
  Administered 2019-04-30: 25 mg via ORAL

## 2019-04-30 MED ORDER — PROCHLORPERAZINE MALEATE 10 MG PO TABS
ORAL_TABLET | ORAL | Status: AC
  Filled 2019-04-30: qty 1

## 2019-04-30 MED ORDER — DEXAMETHASONE 4 MG PO TABS
ORAL_TABLET | ORAL | Status: AC
  Filled 2019-04-30: qty 1

## 2019-04-30 MED ORDER — MONTELUKAST SODIUM 10 MG PO TABS
ORAL_TABLET | ORAL | Status: AC
  Filled 2019-04-30: qty 1

## 2019-04-30 MED ORDER — DEXAMETHASONE 4 MG PO TABS
ORAL_TABLET | ORAL | Status: AC
  Filled 2019-04-30: qty 5

## 2019-04-30 NOTE — Patient Instructions (Addendum)
[facility] Discharge Instructions for Patients Receiving Chemotherapy  Today you received the following agent: Daratumumab injection (Faspro), Bortezomib (Velcade)  To help prevent nausea and vomiting after your treatment, we encourage you to take your nausea medication as directed by your MD.   If you develop nausea and vomiting that is not controlled by your nausea medication, call the clinic.   BELOW ARE SYMPTOMS THAT SHOULD BE REPORTED IMMEDIATELY:  *FEVER GREATER THAN 100.5 F  *CHILLS WITH OR WITHOUT FEVER  NAUSEA AND VOMITING THAT IS NOT CONTROLLED WITH YOUR NAUSEA MEDICATION  *UNUSUAL SHORTNESS OF BREATH  *UNUSUAL BRUISING OR BLEEDING  TENDERNESS IN MOUTH AND THROAT WITH OR WITHOUT PRESENCE OF ULCERS  *URINARY PROBLEMS  *BOWEL PROBLEMS  UNUSUAL RASH Items with * indicate a potential emergency and should be followed up as soon as possible.  Feel free to call the clinic should you have any questions or concerns. The clinic phone number is [phone].  Please show the CHEMO ALERT CARD at check-in to the Emergency Department and triage nurse.   Daratumumab injection (Faspro) What is this medicine? DARATUMUMAB (dar a toom ue mab) is a monoclonal antibody. It is used to treat multiple myeloma. This medicine may be used for other purposes; ask your health care provider or pharmacist if you have questions. COMMON BRAND NAME(S): DARZALEX What should I tell my health care provider before I take this medicine? They need to know if you have any of these conditions:  infection (especially a virus infection such as chickenpox, herpes, or hepatitis B virus)  lung or breathing disease  an unusual or allergic reaction to daratumumab, other medicines, foods, dyes, or preservatives  pregnant or trying to get pregnant  breast-feeding How should I use this medicine? This medicine is for infusion into a vein. It is given by a health care professional in a  hospital or clinic setting. Talk to your pediatrician regarding the use of this medicine in children. Special care may be needed. Overdosage: If you think you have taken too much of this medicine contact a poison control center or emergency room at once. NOTE: This medicine is only for you. Do not share this medicine with others. What if I miss a dose? Keep appointments for follow-up doses as directed. It is important not to miss your dose. Call your doctor or health care professional if you are unable to keep an appointment. What may interact with this medicine? Interactions have not been studied. This list may not describe all possible interactions. Give your health care provider a list of all the medicines, herbs, non-prescription drugs, or dietary supplements you use. Also tell them if you smoke, drink alcohol, or use illegal drugs. Some items may interact with your medicine. What should I watch for while using this medicine? This drug may make you feel generally unwell. Report any side effects. Continue your course of treatment even though you feel ill unless your doctor tells you to stop. This medicine can cause serious allergic reactions. To reduce your risk you may need to take medicine before treatment with this medicine. Take your medicine as directed. This medicine can affect the results of blood tests to match your blood type. These changes can last for up to 6 months after the final dose. Your healthcare provider will do blood tests to match your blood type before you start treatment. Tell all of your healthcare providers that you are being treated with this medicine before receiving a blood transfusion. This   medicine can affect the results of some tests used to determine treatment response; extra tests may be needed to evaluate response. Do not become pregnant while taking this medicine or for 3 months after stopping it. Women should inform their doctor if they wish to become pregnant or  think they might be pregnant. There is a potential for serious side effects to an unborn child. Talk to your health care professional or pharmacist for more information. What side effects may I notice from receiving this medicine? Side effects that you should report to your doctor or health care professional as soon as possible:  allergic reactions like skin rash, itching or hives, swelling of the face, lips, or tongue  breathing problems  chills  cough  dizziness  feeling faint or lightheaded  headache  low blood counts - this medicine may decrease the number of white blood cells, red blood cells and platelets. You may be at increased risk for infections and bleeding.  nausea, vomiting  shortness of breath  signs of decreased platelets or bleeding - bruising, pinpoint red spots on the skin, black, tarry stools, blood in the urine  signs of decreased red blood cells - unusually weak or tired, feeling faint or lightheaded, falls  signs of infection - fever or chills, cough, sore throat, pain or difficulty passing urine  signs and symptoms of liver injury like dark yellow or brown urine; general ill feeling or flu-like symptoms; light-colored stools; loss of appetite; right upper belly pain; unusually weak or tired; yellowing of the eyes or skin Side effects that usually do not require medical attention (report to your doctor or health care professional if they continue or are bothersome):  back pain  constipation  loss of appetite  diarrhea  joint pain  muscle cramps  pain, tingling, numbness in the hands or feet  swelling of the ankles, feet, hands  tiredness  trouble sleeping This list may not describe all possible side effects. Call your doctor for medical advice about side effects. You may report side effects to FDA at 1-800-FDA-1088. Where should I keep my medicine? Keep out of the reach of children. This drug is given in a hospital or clinic and will not be  stored at home. NOTE: This sheet is a summary. It may not cover all possible information. If you have questions about this medicine, talk to your doctor, pharmacist, or health care provider.  2020 [employer]vier/Gold Standard (2018-05-11 14:00:48)  Bortezomib (Velcade) injection What is this medicine? BORTEZOMIB (bor TEZ oh mib) is a medicine that targets proteins in cancer cells and stops the cancer cells from growing. It is used to treat multiple myeloma and mantle-cell lymphoma. This medicine may be used for other purposes; ask your health care provider or pharmacist if you have questions. COMMON BRAND NAME(S): Velcade What should I tell my health care provider before I take this medicine? They need to know if you have any of these conditions:  diabetes  heart disease  irregular heartbeat  liver disease  on hemodialysis  low blood counts, like low white blood cells, platelets, or hemoglobin  peripheral neuropathy  taking medicine for blood pressure  an unusual or allergic reaction to bortezomib, mannitol, boron, other medicines, foods, dyes, or preservatives  pregnant or trying to get pregnant  breast-feeding How should I use this medicine? This medicine is for injection into a vein or for injection under the skin. It is given by a health care professional in a hospital or clinic setting. Talk  to your pediatrician regarding the use of this medicine in children. Special care may be needed. Overdosage: If you think you have taken too much of this medicine contact a poison control center or emergency room at once. NOTE: This medicine is only for you. Do not share this medicine with others. What if I miss a dose? It is important not to miss your dose. Call your doctor or health care professional if you are unable to keep an appointment. What may interact with this medicine? This medicine may interact with the following medications:  ketoconazole  rifampin  ritonavir  St.  John's Wort This list may not describe all possible interactions. Give your health care provider a list of all the medicines, herbs, non-prescription drugs, or dietary supplements you use. Also tell them if you smoke, drink alcohol, or use illegal drugs. Some items may interact with your medicine. What should I watch for while using this medicine? You may get drowsy or dizzy. Do not drive, use machinery, or do anything that needs mental alertness until you know how this medicine affects you. Do not stand or sit up quickly, especially if you are an older patient. This reduces the risk of dizzy or fainting spells. In some cases, you may be given additional medicines to help with side effects. Follow all directions for their use. Call your doctor or health care professional for advice if you get a fever, chills or sore throat, or other symptoms of a cold or flu. Do not treat yourself. This drug decreases your body's ability to fight infections. Try to avoid being around people who are sick. This medicine may increase your risk to bruise or bleed. Call your doctor or health care professional if you notice any unusual bleeding. You may need blood work done while you are taking this medicine. In some patients, this medicine may cause a serious brain infection that may cause death. If you have any problems seeing, thinking, speaking, walking, or standing, tell your doctor right away. If you cannot reach your doctor, urgently seek other source of medical care. Check with your doctor or health care professional if you get an attack of severe diarrhea, nausea and vomiting, or if you sweat a lot. The loss of too much body fluid can make it dangerous for you to take this medicine. Do not become pregnant while taking this medicine or for at least 7 months after stopping it. Women should inform their doctor if they wish to become pregnant or think they might be pregnant. Men should not father a child while taking this  medicine and for at least 4 months after stopping it. There is a potential for serious side effects to an unborn child. Talk to your health care professional or pharmacist for more information. Do not breast-feed an infant while taking this medicine or for 2 months after stopping it. This medicine may interfere with the ability to have a child. You should talk with your doctor or health care professional if you are concerned about your fertility. What side effects may I notice from receiving this medicine? Side effects that you should report to your doctor or health care professional as soon as possible:  allergic reactions like skin rash, itching or hives, swelling of the face, lips, or tongue  breathing problems  changes in hearing  changes in vision  fast, irregular heartbeat  feeling faint or lightheaded, falls  pain, tingling, numbness in the hands or feet  right upper belly pain  seizures    swelling of the ankles, feet, hands  unusual bleeding or bruising  unusually weak or tired  vomiting  yellowing of the eyes or skin Side effects that usually do not require medical attention (report to your doctor or health care professional if they continue or are bothersome):  changes in emotions or moods  constipation  diarrhea  loss of appetite  headache  irritation at site where injected  nausea This list may not describe all possible side effects. Call your doctor for medical advice about side effects. You may report side effects to FDA at 1-800-FDA-1088. Where should I keep my medicine? This drug is given in a hospital or clinic and will not be stored at home. NOTE: This sheet is a summary. It may not cover all possible information. If you have questions about this medicine, talk to your doctor, pharmacist, or health care provider.  2020 Elsevier/Gold Standard (2017-12-05 16:29:31)

## 2019-04-30 NOTE — Progress Notes (Unsigned)
Ok to treat w/ labs from 9/15. Dr. Alvy Bimler aware of BP and ok to treat today. Postpone Zometa treatment until next month.

## 2019-04-30 NOTE — Progress Notes (Unsigned)
Redness noted just beneath injection site that dissipated at time went on. Pharmacy aware. Upon discharge no redness or skin changes noted on abdomen. VSS.

## 2019-05-01 ENCOUNTER — Telehealth: Payer: Self-pay

## 2019-05-01 NOTE — Telephone Encounter (Signed)
Called to follow-up after new treatment yesterday. She is doing well with no complaints. Instructed to call the office if needed for questions or concerns. She verbalized understanding.

## 2019-05-01 NOTE — Telephone Encounter (Signed)
-----   Message from Ishmael Holter, RN sent at 04/30/2019 12:40 PM EDT ----- Regarding: Dr. Alvy Bimler 1st tx f/u call 1st tx f/u call. Faspro and Velcade. Pt tolerated tx well. Some redness at [facility] site just after injection. No redness noted at discharge

## 2019-05-07 ENCOUNTER — Inpatient Hospital Stay: Payer: Medicare Other

## 2019-05-07 ENCOUNTER — Other Ambulatory Visit: Payer: Self-pay

## 2019-05-07 ENCOUNTER — Encounter: Payer: Self-pay | Admitting: Hematology and Oncology

## 2019-05-07 ENCOUNTER — Inpatient Hospital Stay (HOSPITAL_BASED_OUTPATIENT_CLINIC_OR_DEPARTMENT_OTHER): Payer: Medicare Other | Admitting: Hematology and Oncology

## 2019-05-07 VITALS — BP 181/90 | HR 70 | Temp 98.5°F | Resp 18

## 2019-05-07 DIAGNOSIS — Z9481 Bone marrow transplant status: Secondary | ICD-10-CM

## 2019-05-07 DIAGNOSIS — D61818 Other pancytopenia: Secondary | ICD-10-CM

## 2019-05-07 DIAGNOSIS — C9001 Multiple myeloma in remission: Secondary | ICD-10-CM

## 2019-05-07 DIAGNOSIS — R030 Elevated blood-pressure reading, without diagnosis of hypertension: Secondary | ICD-10-CM | POA: Diagnosis not present

## 2019-05-07 DIAGNOSIS — Z7189 Other specified counseling: Secondary | ICD-10-CM

## 2019-05-07 DIAGNOSIS — Z5112 Encounter for antineoplastic immunotherapy: Secondary | ICD-10-CM | POA: Diagnosis not present

## 2019-05-07 LAB — CMP (CANCER CENTER ONLY)
ALT: 8 U/L (ref 0–44)
AST: 16 U/L (ref 15–41)
Albumin: 3.9 g/dL (ref 3.5–5.0)
Alkaline Phosphatase: 80 U/L (ref 38–126)
Anion gap: 8 (ref 5–15)
BUN: 8 mg/dL (ref 8–23)
CO2: 26 mmol/L (ref 22–32)
Calcium: 9 mg/dL (ref 8.9–10.3)
Chloride: 107 mmol/L (ref 98–111)
Creatinine: 0.89 mg/dL (ref 0.44–1.00)
GFR, Est AFR Am: >60 mL/min (ref >60)
GFR, Estimated: >60 mL/min (ref >60)
Glucose, Bld: 87 mg/dL (ref 70–99)
Potassium: 3.7 mmol/L (ref 3.5–5.1)
Sodium: 141 mmol/L (ref 135–145)
Total Bilirubin: 0.7 mg/dL (ref 0.3–1.2)
Total Protein: 6.5 g/dL (ref 6.5–8.1)

## 2019-05-07 LAB — CBC WITH DIFFERENTIAL (CANCER CENTER ONLY)
Abs Immature Granulocytes: 0.01 10*3/uL (ref 0.00–0.07)
Basophils Absolute: 0.1 10*3/uL (ref 0.0–0.1)
Basophils Relative: 2 %
Eosinophils Absolute: 0.1 10*3/uL (ref 0.0–0.5)
Eosinophils Relative: 3 %
HCT: 35.1 % — ABNORMAL LOW (ref 36.0–46.0)
Hemoglobin: 11.1 g/dL — ABNORMAL LOW (ref 12.0–15.0)
Immature Granulocytes: 0 %
Lymphocytes Relative: 32 %
Lymphs Abs: 1.3 10*3/uL (ref 0.7–4.0)
MCH: 28.6 pg (ref 26.0–34.0)
MCHC: 31.6 g/dL (ref 30.0–36.0)
MCV: 90.5 fL (ref 80.0–100.0)
Monocytes Absolute: 0.4 10*3/uL (ref 0.1–1.0)
Monocytes Relative: 9 %
Neutro Abs: 2.1 10*3/uL (ref 1.7–7.7)
Neutrophils Relative %: 54 %
Platelet Count: 270 10*3/uL (ref 150–400)
RBC: 3.88 MIL/uL (ref 3.87–5.11)
RDW: 16 % — ABNORMAL HIGH (ref 11.5–15.5)
WBC Count: 3.9 10*3/uL — ABNORMAL LOW (ref 4.0–10.5)
nRBC: 0 % (ref 0.0–0.2)

## 2019-05-07 LAB — MAGNESIUM: Magnesium: 2.1 mg/dL (ref 1.7–2.4)

## 2019-05-07 MED ORDER — ACETAMINOPHEN 325 MG PO TABS
ORAL_TABLET | ORAL | Status: AC
  Filled 2019-05-07: qty 2

## 2019-05-07 MED ORDER — DIPHENHYDRAMINE HCL 25 MG PO CAPS
ORAL_CAPSULE | ORAL | Status: AC
  Filled 2019-05-07: qty 1

## 2019-05-07 MED ORDER — DEXAMETHASONE 4 MG PO TABS
ORAL_TABLET | ORAL | Status: AC
  Filled 2019-05-07: qty 5

## 2019-05-07 MED ORDER — DEXAMETHASONE 4 MG PO TABS
20.0000 mg | ORAL_TABLET | Freq: Once | ORAL | Status: AC
  Administered 2019-05-07: 20 mg via ORAL

## 2019-05-07 MED ORDER — MONTELUKAST SODIUM 10 MG PO TABS
10.0000 mg | ORAL_TABLET | Freq: Every day | ORAL | Status: DC
  Administered 2019-05-07: 10 mg via ORAL

## 2019-05-07 MED ORDER — BORTEZOMIB CHEMO SQ INJECTION 3.5 MG (2.5MG/ML)
1.3000 mg/m2 | Freq: Once | INTRAMUSCULAR | Status: AC
  Administered 2019-05-07: 1.75 mg via SUBCUTANEOUS
  Filled 2019-05-07: qty 0.7

## 2019-05-07 MED ORDER — DARATUMUMAB-HYALURONIDASE-FIHJ 1800-30000 MG-UT/15ML ~~LOC~~ SOLN
1800.0000 mg | Freq: Once | SUBCUTANEOUS | Status: AC
  Administered 2019-05-07: 1800 mg via SUBCUTANEOUS
  Filled 2019-05-07: qty 15

## 2019-05-07 MED ORDER — MONTELUKAST SODIUM 10 MG PO TABS
ORAL_TABLET | ORAL | Status: AC
  Filled 2019-05-07: qty 1

## 2019-05-07 MED ORDER — PROCHLORPERAZINE MALEATE 10 MG PO TABS
ORAL_TABLET | ORAL | Status: AC
  Filled 2019-05-07: qty 1

## 2019-05-07 MED ORDER — DIPHENHYDRAMINE HCL 25 MG PO CAPS
25.0000 mg | ORAL_CAPSULE | Freq: Once | ORAL | Status: AC
  Administered 2019-05-07: 25 mg via ORAL

## 2019-05-07 MED ORDER — PROCHLORPERAZINE MALEATE 10 MG PO TABS
10.0000 mg | ORAL_TABLET | Freq: Once | ORAL | Status: AC
  Administered 2019-05-07: 10:00:00 10 mg via ORAL

## 2019-05-07 MED ORDER — ACETAMINOPHEN 325 MG PO TABS
650.0000 mg | ORAL_TABLET | Freq: Once | ORAL | Status: AC
  Administered 2019-05-07: 650 mg via ORAL

## 2019-05-07 NOTE — Progress Notes (Signed)
Per Dr. Cindra Presume to treat with today's bp. Pt to stop taking potassium and magnesium supplements.

## 2019-05-07 NOTE — Assessment & Plan Note (Signed)
She has significant elevated blood pressure likely due to anxiety We will proceed with treatment without dose adjustment

## 2019-05-07 NOTE — Progress Notes (Signed)
[facility] OFFICE PROGRESS NOTE  Patient Care Team: Josetta Huddle, MD as PCP - General (Internal Medicine) Ginette Pitman, MD as Consulting Physician (Hematology and Oncology) Hessie Dibble, MD as Consulting Physician (Hematology and Oncology)  ASSESSMENT & PLAN:  Multiple myeloma in remission [facility]) So far, she tolerated treatment well without major side effects We will continue treatment as schedule We will check myeloma panel once a month She will continue calcium vitamin D along with acyclovir for antimicrobial prophylaxis  Pancytopenia, acquired ([place]) She has mild pancytopenia due to treatment She is not symptomatic Observe only  Elevated blood pressure reading in office without diagnosis of hypertension She has significant elevated blood pressure likely due to anxiety We will proceed with treatment without dose adjustment   No orders of the defined types were placed in this encounter.   INTERVAL HISTORY: Please see below for problem oriented charting. She returns for chemotherapy and follow-up She tolerated recent treatment well without major side effects No recent bone pain no infusion reaction   SUMMARY OF ONCOLOGIC HISTORY: Oncology History Overview Note   M-protein 0.69 gm/dl IFIX - IgG, Kappa IgG - 868 IgA - 19 IgM - < 20 Kappa - 21 Lambda - 5.7  09/06/2014 - Bone marrow aspirate and biopsy:   Normocellular marrow for age (40%) with a small monoclonal plasma cell population (1% on aspirate). Karyotype 25, XX  FISH Negative for myeloma associated changes  09/12/2014 - PET/CT  Two regions that are concerning for disease, one adjacent/involving the left ninth rib and one in the marrow of the right femur, in this patient with history of plasmacytoma.    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   04/01/2015 Bone Marrow Transplant   She received melphalan chemotherapy on 03/31/2015 followed by autologous stem cell transplant the day after   04/03/2015 - 04/18/2015 Hospital Admission   The patient was admitted to the hospital at [facility] for management related to complication from stem cell transplant. She had significant nausea requiring intravenous anti-emetics.   07/17/2015 -  Chemotherapy   She started maintenance Revlimid and monthly zometa, then every 3 months   06/01/2018 Imaging   DEXA scan showed bone density T score in femur -2.3   04/20/2019 PET scan   1. No FDG avid osseous lesions or mass identified to suggest metabolically active lesion of myeloma or plasmacytoma. 2. Small nodular density within the paravertebral right lower lobe exhibits mild  to moderate increased uptake within SUV max of 3.38. This is indeterminate. Review of CT chest from 10/05/2018 shows a corresponding Lung nodule in this area measuring the same. Small pulmonary neoplasm cannot be excluded. 3. Indeterminate, focal area of increased uptake is identified within the thoracic canal. Indeterminate favored  to represent benign physiologic CNS activity.     04/30/2019 -  Chemotherapy   The patient had daratumumab-hyaluronidase-fihj (DARZALEX FASPRO) 1800-30000 MG-UT/15ML chemo SQ injection 1,800 mg, 1,800 mg, Subcutaneous,  Once, 1 of 9 cycles Administration: 1,800 mg (04/30/2019)  for chemotherapy treatment.    04/30/2019 -  Chemotherapy   The patient had bortezomib SQ (VELCADE) chemo injection 1.75 mg, 1.3 mg/m2 = 1.75 mg, Subcutaneous,  Once, 2 of 8 cycles Administration: 1.75 mg (04/30/2019)  for chemotherapy treatment.      REVIEW OF SYSTEMS:   Constitutional: Denies fevers, chills or abnormal weight loss Eyes: Denies blurriness of vision Ears, nose, mouth, throat, and face: Denies mucositis or sore throat Respiratory: Denies cough, dyspnea or wheezes Cardiovascular: Denies palpitation, chest discomfort or lower extremity swelling Gastrointestinal:  Denies nausea, heartburn or change in bowel habits Skin: Denies abnormal skin rashes Lymphatics: Denies new lymphadenopathy or easy bruising Neurological:Denies numbness, tingling or new weaknesses Behavioral/Psych: Mood is stable, no new changes  All other systems were reviewed with the patient and are negative.  I have reviewed the past medical history, past surgical history, social history and family history with the patient and they are unchanged from previous note.  ALLERGIES:  is allergic to augmentin [amoxicillin-pot clavulanate]; albuterol; codeine; ibuprofen; nsaids; and tolmetin.  MEDICATIONS:  Current Outpatient Medications  Medication Sig Dispense Refill  . acetaminophen (TYLENOL) 500 MG tablet Take 500-1,000 mg by mouth every 6 (six) hours as needed for mild pain, moderate pain, fever or headache.    Marland Kitchen acyclovir (ZOVIRAX) 400 MG tablet Take 1 tablet (400 mg total) by mouth 2 (two) times daily. 180 tablet 3  . aspirin EC 81 MG tablet Take 81 mg by mouth daily with breakfast.    . calcium carbonate (OSCAL) 1500 (600 Ca) MG  TABS tablet Take 1,500 mg by mouth daily with breakfast.    . Calcium Carbonate 500 MG CHEW Chew 500 mg by mouth as needed for indigestion.    . Cholecalciferol (VITAMIN D3) 2000 units capsule Take 2,000 Units by mouth daily.     Marland Kitchen HYDROcodone-acetaminophen (NORCO/VICODIN) 5-325 MG tablet Take 1 tablet by mouth every 6 (six) hours as needed for moderate pain. 10 tablet 0  . magnesium oxide (MAG-OX) 400 (241.3 Mg) MG tablet Take 1 tablet (400 mg total) by mouth daily. 30 tablet 1  . Multiple Vitamins-Minerals (CENTRUM SILVER PO) Take 1 tablet by mouth daily.     Marland Kitchen omeprazole (PRILOSEC) 20 MG capsule TAKE 1 CAPSULE(20 MG) BY MOUTH DAILY (Patient taking differently: Take 20 mg by mouth daily. ) 90 capsule 9  . ondansetron (ZOFRAN ODT) 4 MG disintegrating tablet Take 1 tablet (4 mg total) by mouth every 8 (eight) hours as needed for nausea or vomiting. 12 tablet 1  . ondansetron (ZOFRAN) 8 MG tablet Take 1 tablet (8 mg total) by mouth every 8 (eight) hours as needed for nausea or vomiting. 60 tablet 1  . potassium chloride SA (K-DUR) 20 MEQ tablet Take 1 tablet (20 mEq total) by mouth daily. 30 tablet 1  . senna-docusate (SENOKOT-S) 8.6-50 MG tablet Take 1-2 tablets by mouth daily as needed for mild constipation or moderate constipation.     Marland Kitchen  sodium chloride (OCEAN) 0.65 % SOLN nasal spray Place 1 spray into both nostrils as needed for congestion.     No current facility-administered medications for this visit.    Facility-Administered Medications Ordered in Other Visits  Medication Dose Route Frequency Provider Last Rate Last Dose  . 0.9 %  sodium chloride infusion   Intravenous Once Alvy Bimler, Brandon Scarbrough, MD      . acetaminophen (TYLENOL) tablet 650 mg  650 mg Oral Once Alvy Bimler, Caly Pellum, MD      . bortezomib SQ (VELCADE) chemo injection 1.75 mg  1.3 mg/m2 (Treatment Plan Recorded) Subcutaneous Once Alvy Bimler, Yitzel Shasteen, MD      . daratumumab-hyaluronidase-fihj (DARZALEX FASPRO) 1800-30000 MG-UT/15ML chemo SQ injection  1,800 mg  1,800 mg Subcutaneous Once Alvy Bimler, Sheranda Seabrooks, MD      . dexamethasone (DECADRON) tablet 20 mg  20 mg Oral Once Alvy Bimler, Linkoln Alkire, MD      . diphenhydrAMINE (BENADRYL) capsule 25 mg  25 mg Oral Once Alvy Bimler, Avik Leoni, MD      . montelukast (SINGULAIR) tablet 10 mg  10 mg Oral QHS Rylee Nuzum, MD      . prochlorperazine (COMPAZINE) tablet 10 mg  10 mg Oral Once [name], MD        PHYSICAL EXAMINATION: ECOG PERFORMANCE STATUS: 1 - Symptomatic but completely ambulatory  Vitals:   05/07/19 0856  BP: (!) 162/99  Pulse: 77  Resp: 18  Temp: 98.2 F (36.8 C)  SpO2: 97%   Filed Weights   05/07/19 0856  Weight: 109 lb 12.8 oz (49.8 kg)    GENERAL:alert, no distress and comfortable SKIN: skin color, texture, turgor are normal, no rashes or significant lesions EYES: normal, Conjunctiva are pink and non-injected, sclera clear OROPHARYNX:no exudate, no erythema and lips, buccal mucosa, and tongue normal  NECK: supple, thyroid normal size, non-tender, without nodularity LYMPH:  no palpable lymphadenopathy in the cervical, axillary or inguinal LUNGS: clear to auscultation and percussion with normal breathing effort HEART: regular rate & rhythm and no murmurs and no lower extremity edema ABDOMEN:abdomen soft, non-tender and normal bowel sounds Musculoskeletal:no cyanosis of digits and no clubbing  NEURO: alert & oriented x 3 with fluent speech, no focal motor/sensory deficits  LABORATORY DATA:  I have reviewed the data as listed    Component Value Date/Time   NA 141 05/07/2019 0840   NA 143 07/14/2017 1245   K 3.7 05/07/2019 0840   K 3.3 (L) 07/14/2017 1245   CL 107 05/07/2019 0840   CO2 26 05/07/2019 0840   CO2 20 (L) 07/14/2017 1245   GLUCOSE 87 05/07/2019 0840   GLUCOSE 106 07/14/2017 1245   BUN 8 05/07/2019 0840   BUN 9.3 07/14/2017 1245   CREATININE 0.89 05/07/2019 0840   CREATININE 0.8 07/14/2017 1245   CALCIUM 9.0 05/07/2019 0840   CALCIUM 8.4 07/14/2017 1245   PROT 6.5  05/07/2019 0840   PROT 6.0 07/14/2017 1245   PROT 6.2 (L) 07/14/2017 1245   ALBUMIN 3.9 05/07/2019 0840   ALBUMIN 3.6 07/14/2017 1245   AST 16 05/07/2019 0840   AST 21 07/14/2017 1245   ALT 8 05/07/2019 0840   ALT 21 07/14/2017 1245   ALKPHOS 80 05/07/2019 0840   ALKPHOS 78 07/14/2017 1245   BILITOT 0.7 05/07/2019 0840   BILITOT 0.43 07/14/2017 1245   GFRNONAA >60 05/07/2019 0840   GFRAA >60 05/07/2019 0840    No results found for: SPEP, UPEP  Lab Results  Component Value Date   WBC 3.9 (L) 05/07/2019  NEUTROABS 2.1 05/07/2019   HGB 11.1 (L) 05/07/2019   HCT 35.1 (L) 05/07/2019   MCV 90.5 05/07/2019   PLT 270 05/07/2019      Chemistry      Component Value Date/Time   NA 141 05/07/2019 0840   NA 143 07/14/2017 1245   K 3.7 05/07/2019 0840   K 3.3 (L) 07/14/2017 1245   CL 107 05/07/2019 0840   CO2 26 05/07/2019 0840   CO2 20 (L) 07/14/2017 1245   BUN 8 05/07/2019 0840   BUN 9.3 07/14/2017 1245   CREATININE 0.89 05/07/2019 0840   CREATININE 0.8 07/14/2017 1245      Component Value Date/Time   CALCIUM 9.0 05/07/2019 0840   CALCIUM 8.4 07/14/2017 1245   ALKPHOS 80 05/07/2019 0840   ALKPHOS 78 07/14/2017 1245   AST 16 05/07/2019 0840   AST 21 07/14/2017 1245   ALT 8 05/07/2019 0840   ALT 21 07/14/2017 1245   BILITOT 0.7 05/07/2019 0840   BILITOT 0.43 07/14/2017 1245       RADIOGRAPHIC STUDIES: I have personally reviewed the radiological images as listed and agreed with the findings in the report. Nm Pet Image Initial (pi) Whole Body  Result Date: 04/20/2019 CLINICAL DATA:  Subsequent treatment strategy for myeloma. EXAM: NUCLEAR MEDICINE PET WHOLE BODY TECHNIQUE: 5.2 mCi F-18 FDG was injected intravenously. Full-ring PET imaging was performed from the skull base to thigh after the radiotracer. CT data was obtained and used for attenuation correction and anatomic localization. Fasting blood glucose: 77 mg/dl COMPARISON:  CT chest, abdomen and pelvis from  10/05/2018 FINDINGS: Mediastinal blood pool activity: SUV max 2.03 HEAD/NECK: No hypermetabolic activity in the scalp. No hypermetabolic cervical lymph nodes. Incidental CT findings: none CHEST: No hypermetabolic mediastinal or hilar nodes. Small nodule within the paravertebral right lower lobe measuring 7 mm has an SUV max 3.38. Indeterminate. Bandlike area of increased uptake (SUV max 4.12 within the posterior right base corresponds to an area of focal ground-glass attenuation and subsegmental atelectasis, image 52/8. Likely inflammatory. Incidental CT findings: Aortic atherosclerosis. ABDOMEN/PELVIS: No abnormal hypermetabolic activity within the liver, pancreas, adrenal glands, or spleen. No hypermetabolic lymph nodes in the abdomen or pelvis. Incidental CT findings: none SKELETON: No focal hypermetabolic activity to suggest skeletal metastasis. Focal area of increased uptake within the lower thoracic canal is identified within SUV max of 4.31. Etiology indeterminate. Incidental CT findings: Previous right frontal craniotomy. EXTREMITIES: No abnormal hypermetabolic activity in the lower extremities. Incidental CT findings: none IMPRESSION: 1. No FDG avid osseous lesions or mass identified to suggest metabolically active lesion of myeloma or plasmacytoma. 2. Small nodular density within the paravertebral right lower lobe exhibits mild to moderate increased uptake within SUV max of 3.38. This is indeterminate. Review of CT chest from 10/05/2018 shows a corresponding Lung nodule in this area measuring the same. Small pulmonary neoplasm cannot be excluded. 3. Indeterminate, focal area of increased uptake is identified within the thoracic canal. Indeterminate favored to represent benign physiologic CNS activity. Electronically Signed   By: Kerby Moors M.D.   On: 04/20/2019 12:58    All questions were answered. The patient knows to call the clinic with any problems, questions or concerns. No barriers to learning  was detected.  I spent 15 minutes counseling the patient face to face. The total time spent in the appointment was 20 minutes and more than 50% was on counseling and review of test results  [name], MD 05/07/2019 10:01 AM

## 2019-05-07 NOTE — Assessment & Plan Note (Signed)
She has mild pancytopenia due to treatment She is not symptomatic Observe only 

## 2019-05-07 NOTE — Patient Instructions (Signed)
[facility] Discharge Instructions for Patients Receiving Chemotherapy  Today you received the following agent: Daratumumab injection (Faspro), Bortezomib (Velcade)  To help prevent nausea and vomiting after your treatment, we encourage you to take your nausea medication as directed by your MD.   If you develop nausea and vomiting that is not controlled by your nausea medication, call the clinic.   BELOW ARE SYMPTOMS THAT SHOULD BE REPORTED IMMEDIATELY:  *FEVER GREATER THAN 100.5 F  *CHILLS WITH OR WITHOUT FEVER  NAUSEA AND VOMITING THAT IS NOT CONTROLLED WITH YOUR NAUSEA MEDICATION  *UNUSUAL SHORTNESS OF BREATH  *UNUSUAL BRUISING OR BLEEDING  TENDERNESS IN MOUTH AND THROAT WITH OR WITHOUT PRESENCE OF ULCERS  *URINARY PROBLEMS  *BOWEL PROBLEMS  UNUSUAL RASH Items with * indicate a potential emergency and should be followed up as soon as possible.  Feel free to call the clinic should you have any questions or concerns. The clinic phone number is [phone].  Please show the [place] at check-in to the Emergency Department and triage nurse.   Stop taking the potassium and magnesium

## 2019-05-07 NOTE — Assessment & Plan Note (Signed)
So far, she tolerated treatment well without major side effects We will continue treatment as schedule We will check myeloma panel once a month She will continue calcium vitamin D along with acyclovir for antimicrobial prophylaxis

## 2019-05-08 LAB — KAPPA/LAMBDA LIGHT CHAINS
Kappa free light chain: 7.2 mg/L (ref 3.3–19.4)
Kappa, lambda light chain ratio: 2.48 — ABNORMAL HIGH (ref 0.26–1.65)
Lambda free light chains: 2.9 mg/L — ABNORMAL LOW (ref 5.7–26.3)

## 2019-05-09 ENCOUNTER — Other Ambulatory Visit: Payer: Self-pay | Admitting: Hematology and Oncology

## 2019-05-09 LAB — MULTIPLE MYELOMA PANEL, SERUM
Albumin SerPl Elph-Mcnc: 3.6 g/dL (ref 2.9–4.4)
Albumin/Glob SerPl: 1.5 (ref 0.7–1.7)
Alpha 1: 0.2 g/dL (ref 0.0–0.4)
Alpha2 Glob SerPl Elph-Mcnc: 1 g/dL (ref 0.4–1.0)
B-Globulin SerPl Elph-Mcnc: 0.9 g/dL (ref 0.7–1.3)
Gamma Glob SerPl Elph-Mcnc: 0.4 g/dL (ref 0.4–1.8)
Globulin, Total: 2.5 g/dL (ref 2.2–3.9)
IgA: 37 mg/dL — ABNORMAL LOW (ref 87–352)
IgG (Immunoglobin G), Serum: 469 mg/dL — ABNORMAL LOW (ref 586–1602)
IgM (Immunoglobulin M), Srm: 9 mg/dL — ABNORMAL LOW (ref 26–217)
M Protein SerPl Elph-Mcnc: 0.1 g/dL — ABNORMAL HIGH
Total Protein ELP: 6.1 g/dL (ref 6.0–8.5)

## 2019-05-10 ENCOUNTER — Telehealth: Payer: Self-pay | Admitting: *Deleted

## 2019-05-10 NOTE — Telephone Encounter (Signed)
Patient called concerned she is not able to sleep the night after treatment. Discussed healthy sleep habits. Patient does not usually have any issues with sleep. It is just the night after treatment. Patient also mentions issues with seasonal allergies. She noted an improvement for the day with benadryl. She is going to take 1 benadryl at bedtime for help with sleep and her allergies.

## 2019-05-14 ENCOUNTER — Other Ambulatory Visit: Payer: Self-pay | Admitting: Hematology and Oncology

## 2019-05-14 ENCOUNTER — Other Ambulatory Visit: Payer: Self-pay

## 2019-05-14 ENCOUNTER — Inpatient Hospital Stay: Payer: Medicare Other

## 2019-05-14 ENCOUNTER — Inpatient Hospital Stay: Payer: Medicare Other | Attending: Hematology and Oncology

## 2019-05-14 VITALS — BP 155/102 | HR 80 | Temp 98.0°F | Resp 18 | Wt 107.0 lb

## 2019-05-14 DIAGNOSIS — Z880 Allergy status to penicillin: Secondary | ICD-10-CM | POA: Diagnosis not present

## 2019-05-14 DIAGNOSIS — D6481 Anemia due to antineoplastic chemotherapy: Secondary | ICD-10-CM | POA: Diagnosis not present

## 2019-05-14 DIAGNOSIS — Z7189 Other specified counseling: Secondary | ICD-10-CM

## 2019-05-14 DIAGNOSIS — Z9481 Bone marrow transplant status: Secondary | ICD-10-CM

## 2019-05-14 DIAGNOSIS — Z5112 Encounter for antineoplastic immunotherapy: Secondary | ICD-10-CM | POA: Diagnosis not present

## 2019-05-14 DIAGNOSIS — Z9221 Personal history of antineoplastic chemotherapy: Secondary | ICD-10-CM | POA: Diagnosis not present

## 2019-05-14 DIAGNOSIS — Z923 Personal history of irradiation: Secondary | ICD-10-CM | POA: Diagnosis not present

## 2019-05-14 DIAGNOSIS — Z885 Allergy status to narcotic agent status: Secondary | ICD-10-CM | POA: Insufficient documentation

## 2019-05-14 DIAGNOSIS — Z79899 Other long term (current) drug therapy: Secondary | ICD-10-CM | POA: Diagnosis not present

## 2019-05-14 DIAGNOSIS — C9001 Multiple myeloma in remission: Secondary | ICD-10-CM

## 2019-05-14 DIAGNOSIS — Z881 Allergy status to other antibiotic agents status: Secondary | ICD-10-CM | POA: Insufficient documentation

## 2019-05-14 DIAGNOSIS — Z886 Allergy status to analgesic agent status: Secondary | ICD-10-CM | POA: Diagnosis not present

## 2019-05-14 DIAGNOSIS — N183 Chronic kidney disease, stage 3 unspecified: Secondary | ICD-10-CM | POA: Diagnosis not present

## 2019-05-14 DIAGNOSIS — Z9484 Stem cells transplant status: Secondary | ICD-10-CM | POA: Diagnosis not present

## 2019-05-14 DIAGNOSIS — J984 Other disorders of lung: Secondary | ICD-10-CM | POA: Insufficient documentation

## 2019-05-14 LAB — CMP (CANCER CENTER ONLY)
ALT: 10 U/L (ref 0–44)
AST: 16 U/L (ref 15–41)
Albumin: 3.7 g/dL (ref 3.5–5.0)
Alkaline Phosphatase: 78 U/L (ref 38–126)
Anion gap: 8 (ref 5–15)
BUN: 12 mg/dL (ref 8–23)
CO2: 24 mmol/L (ref 22–32)
Calcium: 8.6 mg/dL — ABNORMAL LOW (ref 8.9–10.3)
Chloride: 109 mmol/L (ref 98–111)
Creatinine: 0.95 mg/dL (ref 0.44–1.00)
GFR, Est AFR Am: >60 mL/min (ref >60)
GFR, Estimated: >60 mL/min (ref >60)
Glucose, Bld: 84 mg/dL (ref 70–99)
Potassium: 4.5 mmol/L (ref 3.5–5.1)
Sodium: 141 mmol/L (ref 135–145)
Total Bilirubin: 0.6 mg/dL (ref 0.3–1.2)
Total Protein: 6.1 g/dL — ABNORMAL LOW (ref 6.5–8.1)

## 2019-05-14 LAB — MAGNESIUM: Magnesium: 2.8 mg/dL — ABNORMAL HIGH (ref 1.7–2.4)

## 2019-05-14 LAB — CBC WITH DIFFERENTIAL (CANCER CENTER ONLY)
Abs Immature Granulocytes: 0.02 10*3/uL (ref 0.00–0.07)
Basophils Absolute: 0 10*3/uL (ref 0.0–0.1)
Basophils Relative: 1 %
Eosinophils Absolute: 0.1 10*3/uL (ref 0.0–0.5)
Eosinophils Relative: 3 %
HCT: 33.5 % — ABNORMAL LOW (ref 36.0–46.0)
Hemoglobin: 10.8 g/dL — ABNORMAL LOW (ref 12.0–15.0)
Immature Granulocytes: 1 %
Lymphocytes Relative: 28 %
Lymphs Abs: 1.1 10*3/uL (ref 0.7–4.0)
MCH: 28.9 pg (ref 26.0–34.0)
MCHC: 32.2 g/dL (ref 30.0–36.0)
MCV: 89.6 fL (ref 80.0–100.0)
Monocytes Absolute: 0.4 10*3/uL (ref 0.1–1.0)
Monocytes Relative: 10 %
Neutro Abs: 2.4 10*3/uL (ref 1.7–7.7)
Neutrophils Relative %: 57 %
Platelet Count: 193 10*3/uL (ref 150–400)
RBC: 3.74 MIL/uL — ABNORMAL LOW (ref 3.87–5.11)
RDW: 16.4 % — ABNORMAL HIGH (ref 11.5–15.5)
WBC Count: 4.1 10*3/uL (ref 4.0–10.5)
nRBC: 0 % (ref 0.0–0.2)

## 2019-05-14 MED ORDER — DARATUMUMAB-HYALURONIDASE-FIHJ 1800-30000 MG-UT/15ML ~~LOC~~ SOLN
1800.0000 mg | Freq: Once | SUBCUTANEOUS | Status: AC
  Administered 2019-05-14: 1800 mg via SUBCUTANEOUS
  Filled 2019-05-14: qty 15

## 2019-05-14 MED ORDER — DIPHENHYDRAMINE HCL 25 MG PO CAPS
25.0000 mg | ORAL_CAPSULE | Freq: Once | ORAL | Status: AC
  Administered 2019-05-14: 25 mg via ORAL

## 2019-05-14 MED ORDER — MONTELUKAST SODIUM 10 MG PO TABS
ORAL_TABLET | ORAL | Status: AC
  Filled 2019-05-14: qty 1

## 2019-05-14 MED ORDER — PROCHLORPERAZINE MALEATE 10 MG PO TABS
ORAL_TABLET | ORAL | Status: AC
  Filled 2019-05-14: qty 1

## 2019-05-14 MED ORDER — DIPHENHYDRAMINE HCL 25 MG PO CAPS
ORAL_CAPSULE | ORAL | Status: AC
  Filled 2019-05-14: qty 1

## 2019-05-14 MED ORDER — PROCHLORPERAZINE MALEATE 10 MG PO TABS
10.0000 mg | ORAL_TABLET | Freq: Once | ORAL | Status: AC
  Administered 2019-05-14: 10 mg via ORAL

## 2019-05-14 MED ORDER — DEXAMETHASONE 4 MG PO TABS
ORAL_TABLET | ORAL | Status: AC
  Filled 2019-05-14: qty 5

## 2019-05-14 MED ORDER — MONTELUKAST SODIUM 10 MG PO TABS
10.0000 mg | ORAL_TABLET | Freq: Every day | ORAL | Status: DC
  Administered 2019-05-14: 10 mg via ORAL

## 2019-05-14 MED ORDER — ACETAMINOPHEN 325 MG PO TABS
650.0000 mg | ORAL_TABLET | Freq: Once | ORAL | Status: AC
  Administered 2019-05-14: 650 mg via ORAL

## 2019-05-14 MED ORDER — ACETAMINOPHEN 325 MG PO TABS
ORAL_TABLET | ORAL | Status: AC
  Filled 2019-05-14: qty 2

## 2019-05-14 MED ORDER — DEXAMETHASONE 4 MG PO TABS
20.0000 mg | ORAL_TABLET | Freq: Once | ORAL | Status: AC
  Administered 2019-05-14: 20 mg via ORAL

## 2019-05-14 MED ORDER — ONDANSETRON HCL 8 MG PO TABS: 8.0000 mg | ORAL_TABLET | Freq: Three times a day (TID) | ORAL | 1 refills | Status: DC | PRN

## 2019-05-14 MED ORDER — BORTEZOMIB CHEMO SQ INJECTION 3.5 MG (2.5MG/ML)
1.3000 mg/m2 | Freq: Once | INTRAMUSCULAR | Status: AC
  Administered 2019-05-14: 1.75 mg via SUBCUTANEOUS
  Filled 2019-05-14: qty 0.7

## 2019-05-14 NOTE — Progress Notes (Signed)
Per Dr. Alvy Bimler, okay to treat today with BP of 158/108.

## 2019-05-14 NOTE — Patient Instructions (Signed)
[facility] Discharge Instructions for Patients Receiving Chemotherapy  Today you received the following chemotherapy agents: Velcade/Darzalex Faspro.  To help prevent nausea and vomiting after your treatment, we encourage you to take your nausea medication as directed.   If you develop nausea and vomiting that is not controlled by your nausea medication, call the clinic.   BELOW ARE SYMPTOMS THAT SHOULD BE REPORTED IMMEDIATELY:  *FEVER GREATER THAN 100.5 F  *CHILLS WITH OR WITHOUT FEVER  NAUSEA AND VOMITING THAT IS NOT CONTROLLED WITH YOUR NAUSEA MEDICATION  *UNUSUAL SHORTNESS OF BREATH  *UNUSUAL BRUISING OR BLEEDING  TENDERNESS IN MOUTH AND THROAT WITH OR WITHOUT PRESENCE OF ULCERS  *URINARY PROBLEMS  *BOWEL PROBLEMS  UNUSUAL RASH Items with * indicate a potential emergency and should be followed up as soon as possible.  Feel free to call the clinic should you have any questions or concerns. The clinic phone number is [phone].  Please show the CHEMO ALERT CARD at check-in to the Emergency Department and triage nurse.   

## 2019-05-21 ENCOUNTER — Inpatient Hospital Stay (HOSPITAL_BASED_OUTPATIENT_CLINIC_OR_DEPARTMENT_OTHER): Payer: Medicare Other | Admitting: Hematology and Oncology

## 2019-05-21 ENCOUNTER — Inpatient Hospital Stay: Payer: Medicare Other

## 2019-05-21 ENCOUNTER — Encounter: Payer: Self-pay | Admitting: Hematology and Oncology

## 2019-05-21 ENCOUNTER — Other Ambulatory Visit: Payer: Self-pay

## 2019-05-21 ENCOUNTER — Other Ambulatory Visit: Payer: Self-pay | Admitting: Hematology and Oncology

## 2019-05-21 DIAGNOSIS — D638 Anemia in other chronic diseases classified elsewhere: Secondary | ICD-10-CM | POA: Diagnosis not present

## 2019-05-21 DIAGNOSIS — C9001 Multiple myeloma in remission: Secondary | ICD-10-CM

## 2019-05-21 DIAGNOSIS — N183 Chronic kidney disease, stage 3 unspecified: Secondary | ICD-10-CM | POA: Diagnosis not present

## 2019-05-21 DIAGNOSIS — Z9481 Bone marrow transplant status: Secondary | ICD-10-CM

## 2019-05-21 DIAGNOSIS — Z7189 Other specified counseling: Secondary | ICD-10-CM

## 2019-05-21 DIAGNOSIS — Z5112 Encounter for antineoplastic immunotherapy: Secondary | ICD-10-CM | POA: Diagnosis not present

## 2019-05-21 LAB — CBC WITH DIFFERENTIAL (CANCER CENTER ONLY)
Abs Immature Granulocytes: 0.01 10*3/uL (ref 0.00–0.07)
Basophils Absolute: 0 10*3/uL (ref 0.0–0.1)
Basophils Relative: 1 %
Eosinophils Absolute: 0.1 10*3/uL (ref 0.0–0.5)
Eosinophils Relative: 3 %
HCT: 36.4 % (ref 36.0–46.0)
Hemoglobin: 11.9 g/dL — ABNORMAL LOW (ref 12.0–15.0)
Immature Granulocytes: 0 %
Lymphocytes Relative: 38 %
Lymphs Abs: 1.5 10*3/uL (ref 0.7–4.0)
MCH: 29.8 pg (ref 26.0–34.0)
MCHC: 32.7 g/dL (ref 30.0–36.0)
MCV: 91 fL (ref 80.0–100.0)
Monocytes Absolute: 0.4 10*3/uL (ref 0.1–1.0)
Monocytes Relative: 10 %
Neutro Abs: 2 10*3/uL (ref 1.7–7.7)
Neutrophils Relative %: 48 %
Platelet Count: 193 10*3/uL (ref 150–400)
RBC: 4 MIL/uL (ref 3.87–5.11)
RDW: 16.1 % — ABNORMAL HIGH (ref 11.5–15.5)
WBC Count: 4.1 10*3/uL (ref 4.0–10.5)
nRBC: 0 % (ref 0.0–0.2)

## 2019-05-21 LAB — CMP (CANCER CENTER ONLY)
ALT: 11 U/L (ref 0–44)
AST: 16 U/L (ref 15–41)
Albumin: 4.1 g/dL (ref 3.5–5.0)
Alkaline Phosphatase: 80 U/L (ref 38–126)
Anion gap: 8 (ref 5–15)
BUN: 13 mg/dL (ref 8–23)
CO2: 24 mmol/L (ref 22–32)
Calcium: 9.2 mg/dL (ref 8.9–10.3)
Chloride: 108 mmol/L (ref 98–111)
Creatinine: 1.02 mg/dL — ABNORMAL HIGH (ref 0.44–1.00)
GFR, Est AFR Am: >60 mL/min (ref >60)
GFR, Estimated: 56 mL/min — ABNORMAL LOW (ref >60)
Glucose, Bld: 86 mg/dL (ref 70–99)
Potassium: 4.3 mmol/L (ref 3.5–5.1)
Sodium: 140 mmol/L (ref 135–145)
Total Bilirubin: 0.7 mg/dL (ref 0.3–1.2)
Total Protein: 6.9 g/dL (ref 6.5–8.1)

## 2019-05-21 LAB — MAGNESIUM: Magnesium: 2.4 mg/dL (ref 1.7–2.4)

## 2019-05-21 MED ORDER — SODIUM CHLORIDE 0.9 % IV SOLN
Freq: Once | INTRAVENOUS | Status: AC
  Administered 2019-05-21: 12:00:00 via INTRAVENOUS
  Filled 2019-05-21: qty 250

## 2019-05-21 MED ORDER — DIPHENHYDRAMINE HCL 25 MG PO CAPS
ORAL_CAPSULE | ORAL | Status: AC
  Filled 2019-05-21: qty 1

## 2019-05-21 MED ORDER — ACETAMINOPHEN 325 MG PO TABS
ORAL_TABLET | ORAL | Status: AC
  Filled 2019-05-21: qty 2

## 2019-05-21 MED ORDER — MONTELUKAST SODIUM 10 MG PO TABS
ORAL_TABLET | ORAL | Status: AC
  Filled 2019-05-21: qty 1

## 2019-05-21 MED ORDER — ACETAMINOPHEN 325 MG PO TABS
650.0000 mg | ORAL_TABLET | Freq: Once | ORAL | Status: AC
  Administered 2019-05-21: 650 mg via ORAL

## 2019-05-21 MED ORDER — MONTELUKAST SODIUM 10 MG PO TABS
10.0000 mg | ORAL_TABLET | Freq: Once | ORAL | Status: AC
  Administered 2019-05-21: 10 mg via ORAL

## 2019-05-21 MED ORDER — ZOLEDRONIC ACID 4 MG/5ML IV CONC
3.0000 mg | Freq: Once | INTRAVENOUS | Status: AC
  Administered 2019-05-21: 3 mg via INTRAVENOUS
  Filled 2019-05-21: qty 3.75

## 2019-05-21 MED ORDER — BORTEZOMIB CHEMO SQ INJECTION 3.5 MG (2.5MG/ML)
1.3000 mg/m2 | Freq: Once | INTRAMUSCULAR | Status: AC
  Administered 2019-05-21: 1.75 mg via SUBCUTANEOUS
  Filled 2019-05-21: qty 0.7

## 2019-05-21 MED ORDER — DARATUMUMAB-HYALURONIDASE-FIHJ 1800-30000 MG-UT/15ML ~~LOC~~ SOLN
1800.0000 mg | Freq: Once | SUBCUTANEOUS | Status: AC
  Administered 2019-05-21: 12:00:00 1800 mg via SUBCUTANEOUS
  Filled 2019-05-21: qty 15

## 2019-05-21 MED ORDER — PROCHLORPERAZINE MALEATE 10 MG PO TABS
ORAL_TABLET | ORAL | Status: AC
  Filled 2019-05-21: qty 1

## 2019-05-21 MED ORDER — DEXAMETHASONE 4 MG PO TABS
ORAL_TABLET | ORAL | Status: AC
  Filled 2019-05-21: qty 5

## 2019-05-21 MED ORDER — DEXAMETHASONE 4 MG PO TABS
20.0000 mg | ORAL_TABLET | Freq: Once | ORAL | Status: AC
  Administered 2019-05-21: 20 mg via ORAL

## 2019-05-21 MED ORDER — PROCHLORPERAZINE MALEATE 10 MG PO TABS
10.0000 mg | ORAL_TABLET | Freq: Once | ORAL | Status: AC
  Administered 2019-05-21: 10 mg via ORAL

## 2019-05-21 MED ORDER — DIPHENHYDRAMINE HCL 25 MG PO CAPS
25.0000 mg | ORAL_CAPSULE | Freq: Once | ORAL | Status: AC
  Administered 2019-05-21: 25 mg via ORAL

## 2019-05-21 NOTE — Assessment & Plan Note (Signed)
She has stable chronic kidney disease I will continue reduced dose Zometa 

## 2019-05-21 NOTE — Progress Notes (Signed)
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  Observe  Chronic kidney disease, stage III (moderate) She has stable chronic kidney disease I will continue reduced dose Zometa   No orders of the defined types were placed in this encounter.   INTERVAL HISTORY: Please see below for problem oriented charting. She returns for further evaluation She tolerated chemotherapy well Denies peripheral neuropathy No recent infection, fever or chills She denies infusion reaction  SUMMARY OF ONCOLOGIC HISTORY: Oncology History Overview Note   M-protein 0.69 gm/dl IFIX - IgG, Kappa IgG - 868 IgA - 19 IgM - < 20 Kappa - 21 Lambda - 5.7  09/06/2014 - Bone marrow aspirate and biopsy:   Normocellular marrow for age (40%) with a small monoclonal plasma cell population (1% on aspirate). Karyotype 78, XX  FISH Negative for myeloma associated changes  09/12/2014 - PET/CT  Two regions that are concerning for disease, one adjacent/involving the left ninth rib and one in the marrow of the right femur, in this patient with history of plasmacytoma.    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   04/01/2015 Bone Marrow Transplant   She received melphalan chemotherapy on 03/31/2015 followed by autologous stem cell transplant the day after   04/03/2015 - 04/18/2015 Hospital Admission   The patient was admitted to the hospital at [facility] for management related to complication from stem cell transplant. She had significant nausea requiring intravenous anti-emetics.   07/17/2015 -  Chemotherapy   She started maintenance Revlimid and monthly zometa, then every 3 months   06/01/2018 Imaging   DEXA scan showed bone density T score in femur -2.3   04/20/2019 PET scan   1. No FDG avid osseous lesions or mass identified to suggest metabolically active lesion of myeloma or plasmacytoma. 2. Small nodular density within the paravertebral right lower lobe exhibits mild to  moderate increased uptake within SUV max of 3.38. This is indeterminate. Review of CT chest from 10/05/2018 shows a corresponding Lung nodule in this area measuring the same. Small pulmonary neoplasm cannot be excluded. 3. Indeterminate, focal area of increased uptake is identified within the thoracic canal. Indeterminate favored  to represent benign physiologic CNS activity.     04/30/2019 -  Chemotherapy   The patient had daratumumab-hyaluronidase-fihj (DARZALEX FASPRO) 1800-30000 MG-UT/15ML chemo SQ injection 1,800 mg, 1,800 mg, Subcutaneous,  Once, 1 of 9 cycles Administration: 1,800 mg (04/30/2019), 1,800 mg (05/07/2019), 1,800 mg (05/14/2019)  for chemotherapy treatment.    04/30/2019 -  Chemotherapy   The patient had bortezomib SQ (VELCADE) chemo injection 1.75 mg, 1.3 mg/m2 = 1.75 mg, Subcutaneous,  Once, 4 of 8 cycles Administration: 1.75 mg (04/30/2019), 1.75 mg (05/07/2019), 1.75 mg (05/14/2019)  for chemotherapy treatment.      REVIEW OF SYSTEMS:   Constitutional: Denies fevers, chills or abnormal weight loss Eyes: Denies blurriness of vision Ears, nose, mouth, throat, and face: Denies mucositis or sore throat Respiratory: Denies cough, dyspnea or wheezes Cardiovascular: Denies palpitation, chest discomfort or lower extremity swelling Gastrointestinal:  Denies nausea, heartburn or change in bowel habits Skin: Denies abnormal skin rashes Lymphatics: Denies new lymphadenopathy or easy bruising Neurological:Denies numbness, tingling or new weaknesses Behavioral/Psych: Mood is stable, no new changes  All other systems were reviewed with the patient and are negative.  I have reviewed the past medical history, past surgical history, social history and family history with the patient and they are unchanged from previous note.  ALLERGIES:  is allergic to augmentin [amoxicillin-pot clavulanate]; albuterol; codeine; ibuprofen; nsaids; and tolmetin.  MEDICATIONS:  Current Outpatient Medications  Medication Sig Dispense Refill  . acetaminophen (TYLENOL) 500 MG tablet Take 500-1,000 mg by mouth every 6 (six) hours as needed for mild pain, moderate pain, fever or headache.    Marland Kitchen acyclovir (ZOVIRAX) 400 MG tablet Take 1 tablet (400 mg total) by mouth 2 (two) times daily. 180 tablet 3  . aspirin EC 81 MG tablet Take 81  mg by mouth daily with breakfast.    . calcium carbonate (OSCAL) 1500 (600 Ca) MG TABS tablet Take 1,500 mg by mouth daily with breakfast.    . Calcium Carbonate 500 MG CHEW Chew 500 mg by mouth as needed for indigestion.    . Cholecalciferol (VITAMIN D3) 2000 units capsule Take 2,000 Units by mouth daily.     Marland Kitchen HYDROcodone-acetaminophen (NORCO/VICODIN) 5-325 MG tablet Take 1 tablet by mouth every 6 (six) hours as needed for moderate pain. 10 tablet 0  . Multiple Vitamins-Minerals (CENTRUM SILVER PO) Take 1 tablet by mouth daily.     Marland Kitchen omeprazole (PRILOSEC) 20 MG capsule Take 1 capsule (20 mg total) by mouth daily. 90 capsule 11  . ondansetron (ZOFRAN ODT) 4 MG disintegrating tablet Take 1 tablet (4 mg total) by mouth every 8 (eight) hours as needed for nausea or vomiting. 12 tablet 1  . ondansetron (ZOFRAN) 8 MG tablet Take 1 tablet (8 mg total) by mouth every 8 (eight) hours as needed for nausea or vomiting. 60 tablet 1  . senna-docusate (SENOKOT-S) 8.6-50 MG tablet Take 1-2 tablets by mouth daily as needed for mild constipation or moderate constipation.     . sodium chloride (OCEAN) 0.65 % SOLN nasal spray Place 1 spray into both nostrils as needed for congestion.     No current facility-administered medications for this visit.    Facility-Administered Medications Ordered in Other Visits  Medication Dose Route Frequency Provider Last Rate Last Dose  . 0.9 %  sodium chloride infusion   Intravenous Once Alvy Bimler, Ni, MD        PHYSICAL EXAMINATION: ECOG PERFORMANCE STATUS: 0 - Asymptomatic  Vitals:   05/21/19 1010  BP: (!) 152/87  Pulse: 87  Resp: 18  Temp: 98.2 F (36.8 C)  SpO2: 100%   Filed Weights   05/21/19 1010  Weight: 105 lb 14.4 oz (48 kg)    GENERAL:alert, no distress and comfortable SKIN: skin color, texture, turgor are normal, no rashes or significant lesions EYES: normal, Conjunctiva are pink and non-injected, sclera clear OROPHARYNX:no exudate, no erythema and  lips, buccal mucosa, and tongue normal  NECK: supple, thyroid normal size, non-tender, without nodularity LYMPH:  no palpable lymphadenopathy in the cervical, axillary or inguinal LUNGS: clear to auscultation and percussion with normal breathing effort HEART: regular rate & rhythm and no murmurs and no lower extremity edema ABDOMEN:abdomen soft, non-tender and normal bowel sounds Musculoskeletal:no cyanosis of digits and no clubbing  NEURO: alert & oriented x 3 with fluent speech, no focal motor/sensory deficits  LABORATORY DATA:  I have reviewed the data as listed    Component Value Date/Time   NA 140 05/21/2019 0943   NA 143 07/14/2017 1245   K 4.3 05/21/2019 0943   K 3.3 (L) 07/14/2017 1245   CL 108 05/21/2019 0943   CO2 24 05/21/2019 0943   CO2 20 (L) 07/14/2017 1245   GLUCOSE 86 05/21/2019 0943   GLUCOSE 106 07/14/2017 1245   BUN 13 05/21/2019 0943   BUN 9.3 07/14/2017 1245   CREATININE 1.02 (H) 05/21/2019 0943   CREATININE 0.8 07/14/2017 1245   CALCIUM 9.2 05/21/2019 0943   CALCIUM 8.4 07/14/2017 1245   PROT 6.9 05/21/2019 0943   PROT 6.0 07/14/2017 1245   PROT 6.2 (L) 07/14/2017 1245   ALBUMIN 4.1 05/21/2019 0943   ALBUMIN 3.6 07/14/2017 1245   AST 16 05/21/2019 0943   AST 21 07/14/2017 1245   ALT 11 05/21/2019 0943   ALT 21 07/14/2017 1245   ALKPHOS 80 05/21/2019 0943   ALKPHOS 78 07/14/2017 1245   BILITOT 0.7 05/21/2019 0943   BILITOT 0.43 07/14/2017 1245   GFRNONAA 56 (L) 05/21/2019 0943   GFRAA >60 05/21/2019 0943    No results found for: SPEP, UPEP  Lab Results  Component Value Date   WBC 4.1 05/21/2019   NEUTROABS 2.0 05/21/2019   HGB 11.9 (L) 05/21/2019   HCT 36.4 05/21/2019   MCV 91.0 05/21/2019   PLT 193 05/21/2019      Chemistry      Component Value Date/Time   NA 140 05/21/2019 0943   NA 143 07/14/2017 1245   K 4.3 05/21/2019 0943   K 3.3 (L) 07/14/2017 1245   CL 108 05/21/2019 0943   CO2 24 05/21/2019 0943   CO2 20 (L) 07/14/2017  1245   BUN 13 05/21/2019 0943   BUN 9.3 07/14/2017 1245   CREATININE 1.02 (H) 05/21/2019 0943   CREATININE 0.8 07/14/2017 1245      Component Value Date/Time   CALCIUM 9.2 05/21/2019 0943   CALCIUM 8.4 07/14/2017 1245   ALKPHOS 80 05/21/2019 0943   ALKPHOS 78 07/14/2017 1245   AST 16 05/21/2019 0943   AST 21 07/14/2017 1245   ALT 11 05/21/2019 0943   ALT 21 07/14/2017 1245   BILITOT 0.7 05/21/2019 0943   BILITOT 0.43 07/14/2017 1245      All questions  were answered. The patient knows to call the clinic with any problems, questions or concerns. No barriers to learning was detected.  I spent 15 minutes counseling the patient face to face. The total time spent in the appointment was 20 minutes and more than 50% was on counseling and review of test results  [name], MD 05/21/2019 12:43 PM

## 2019-05-21 NOTE — Progress Notes (Signed)
Continue Zometa 3mg  today per MD.   Hardie Pulley, PharmD, BCPS, BCOP

## 2019-05-21 NOTE — Assessment & Plan Note (Signed)
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  Observe  

## 2019-05-21 NOTE — Patient Instructions (Signed)
[facility] Discharge Instructions for Patients Receiving Chemotherapy  Today you received the following chemotherapy agents: Bortezomib (Velcade) and Daratumumab (Darzalex Fastpro)  To help prevent nausea and vomiting after your treatment, we encourage you to take your nausea medication as directed.   If you develop nausea and vomiting that is not controlled by your nausea medication, call the clinic.   BELOW ARE SYMPTOMS THAT SHOULD BE REPORTED IMMEDIATELY:  *FEVER GREATER THAN 100.5 F  *CHILLS WITH OR WITHOUT FEVER  NAUSEA AND VOMITING THAT IS NOT CONTROLLED WITH YOUR NAUSEA MEDICATION  *UNUSUAL SHORTNESS OF BREATH  *UNUSUAL BRUISING OR BLEEDING  TENDERNESS IN MOUTH AND THROAT WITH OR WITHOUT PRESENCE OF ULCERS  *URINARY PROBLEMS  *BOWEL PROBLEMS  UNUSUAL RASH Items with * indicate a potential emergency and should be followed up as soon as possible.  Feel free to call the clinic should you have any questions or concerns. The clinic phone number is [phone].  Please show the [place] at check-in to the Emergency Department and triage nurse.  Zoledronic Acid injection (Hypercalcemia, Oncology) What is this medicine? ZOLEDRONIC ACID (ZOE le dron ik AS id) lowers the amount of calcium loss from bone. It is used to treat too much calcium in your blood from cancer. It is also used to prevent complications of cancer that has spread to the bone. This medicine may be used for other purposes; ask your health care provider or pharmacist if you have questions. COMMON BRAND NAME(S): Zometa What should I tell my health care provider before I take this medicine? They need to know if you have any of these conditions:  aspirin-sensitive asthma  cancer, especially if you are receiving medicines used to treat cancer  dental disease or wear dentures  infection  kidney disease  receiving corticosteroids like dexamethasone or prednisone  an unusual or  allergic reaction to zoledronic acid, other medicines, foods, dyes, or preservatives  pregnant or trying to get pregnant  breast-feeding How should I use this medicine? This medicine is for infusion into a vein. It is given by a health care professional in a hospital or clinic setting. Talk to your pediatrician regarding the use of this medicine in children. Special care may be needed. Overdosage: If you think you have taken too much of this medicine contact a poison control center or emergency room at once. NOTE: This medicine is only for you. Do not share this medicine with others. What if I miss a dose? It is important not to miss your dose. Call your doctor or health care professional if you are unable to keep an appointment. What may interact with this medicine?  certain antibiotics given by injection  NSAIDs, medicines for pain and inflammation, like ibuprofen or naproxen  some diuretics like bumetanide, furosemide  teriparatide  thalidomide This list may not describe all possible interactions. Give your health care provider a list of all the medicines, herbs, non-prescription drugs, or dietary supplements you use. Also tell them if you smoke, drink alcohol, or use illegal drugs. Some items may interact with your medicine. What should I watch for while using this medicine? Visit your doctor or health care professional for regular checkups. It may be some time before you see the benefit from this medicine. Do not stop taking your medicine unless your doctor tells you to. Your doctor may order blood tests or other tests to see how you are doing. Women should inform their doctor if they wish to become pregnant or  think they might be pregnant. There is a potential for serious side effects to an unborn child. Talk to your health care professional or pharmacist for more information. You should make sure that you get enough calcium and vitamin D while you are taking this medicine. Discuss  the foods you eat and the vitamins you take with your health care professional. Some people who take this medicine have severe bone, joint, and/or muscle pain. This medicine may also increase your risk for jaw problems or a broken thigh bone. Tell your doctor right away if you have severe pain in your jaw, bones, joints, or muscles. Tell your doctor if you have any pain that does not go away or that gets worse. Tell your dentist and dental surgeon that you are taking this medicine. You should not have major dental surgery while on this medicine. See your dentist to have a dental exam and fix any dental problems before starting this medicine. Take good care of your teeth while on this medicine. Make sure you see your dentist for regular follow-up appointments. What side effects may I notice from receiving this medicine? Side effects that you should report to your doctor or health care professional as soon as possible:  allergic reactions like skin rash, itching or hives, swelling of the face, lips, or tongue  anxiety, confusion, or depression  breathing problems  changes in vision  eye pain  feeling faint or lightheaded, falls  jaw pain, especially after dental work  mouth sores  muscle cramps, stiffness, or weakness  redness, blistering, peeling or loosening of the skin, including inside the mouth  trouble passing urine or change in the amount of urine Side effects that usually do not require medical attention (report to your doctor or health care professional if they continue or are bothersome):  bone, joint, or muscle pain  constipation  diarrhea  fever  hair loss  irritation at site where injected  loss of appetite  nausea, vomiting  stomach upset  trouble sleeping  trouble swallowing  weak or tired This list may not describe all possible side effects. Call your doctor for medical advice about side effects. You may report side effects to FDA at  1-800-FDA-1088. Where should I keep my medicine? This drug is given in a hospital or clinic and will not be stored at home. NOTE: This sheet is a summary. It may not cover all possible information. If you have questions about this medicine, talk to your doctor, pharmacist, or health care provider.  2020 Elsevier/Gold Standard (2013-12-22 14:19:39)  Coronavirus (COVID-19) Are you at risk?  Are you at risk for the Coronavirus (COVID-19)?  To be considered HIGH RISK for Coronavirus (COVID-19), you have to meet the following criteria:  . Traveled to [place], [place], [place], [place] or [place]; or in the [place] to [place], [place], [place], or [place]; and have fever, cough, and shortness of breath within the last 2 weeks of travel OR . Been in close contact with a person diagnosed with COVID-19 within the last 2 weeks and have fever, cough, and shortness of breath . IF YOU DO NOT MEET THESE CRITERIA, YOU ARE CONSIDERED LOW RISK FOR COVID-19.  What to do if you are HIGH RISK for COVID-19?  Marland Kitchen If you are having a medical emergency, call 911. . Seek medical care right away. Before you go to a doctor's office, urgent care or emergency department, call ahead and tell them about your recent travel, contact with  someone diagnosed with COVID-19, and your symptoms. You should receive instructions from your physician's office regarding next steps of care.  . When you arrive at healthcare provider, tell the healthcare staff immediately you have returned from visiting [place], [place], [place], [place] or [place]; or traveled in the [place] to [place], [place], [place], or [place]; in the last two weeks or you have been in close contact with a person diagnosed with COVID-19 in the last 2 weeks.   . Tell the health care staff about your symptoms: fever, cough and shortness of breath. . After you have been seen by a medical provider, you will be either: o Tested for (COVID-19) and  discharged home on quarantine except to seek medical care if symptoms worsen, and asked to  - Stay home and avoid contact with others until you get your results (4-5 days)  - Avoid travel on public transportation if possible (such as bus, train, or airplane) or o Sent to the Emergency Department by EMS for evaluation, COVID-19 testing, and possible admission depending on your condition and test results.  What to do if you are LOW RISK for COVID-19?  Reduce your risk of any infection by using the same precautions used for avoiding the common cold or flu:  Marland Kitchen Wash your hands often with soap and warm water for at least 20 seconds.  If soap and water are not readily available, use an alcohol-based hand sanitizer with at least 60% alcohol.  . If coughing or sneezing, cover your mouth and nose by coughing or sneezing into the elbow areas of your shirt or coat, into a tissue or into your sleeve (not your hands). . Avoid shaking hands with others and consider head nods or verbal greetings only. . Avoid touching your eyes, nose, or mouth with unwashed hands.  . Avoid close contact with people who are sick. . Avoid places or events with large numbers of people in one location, like concerts or sporting events. . Carefully consider travel plans you have or are making. . If you are planning any travel outside or inside the [place], visit the CDC's Travelers' Health webpage for the latest health notices. . If you have some symptoms but not all symptoms, continue to monitor at home and seek medical attention if your symptoms worsen. . If you are having a medical emergency, call 911.   [place] / e-Visit: [email or web address]         [facility]: [facility]: [id number]                   [facility]: [phone]

## 2019-05-21 NOTE — Assessment & Plan Note (Signed)
She tolerated chemotherapy very well with no major side effects We will continue myeloma panel once a month We will also continue calcium with vitamin D and Zometa as scheduled She will continue antimicrobial prophylaxis with acyclovir

## 2019-05-22 ENCOUNTER — Telehealth: Payer: Self-pay | Admitting: Hematology and Oncology

## 2019-05-22 NOTE — Telephone Encounter (Signed)
I talk with patient regarding schedule  

## 2019-05-28 ENCOUNTER — Other Ambulatory Visit: Payer: Self-pay

## 2019-05-28 ENCOUNTER — Inpatient Hospital Stay: Payer: Medicare Other

## 2019-05-28 VITALS — BP 155/95 | HR 73 | Temp 97.8°F | Resp 16

## 2019-05-28 DIAGNOSIS — C9001 Multiple myeloma in remission: Secondary | ICD-10-CM

## 2019-05-28 DIAGNOSIS — Z7189 Other specified counseling: Secondary | ICD-10-CM

## 2019-05-28 DIAGNOSIS — Z9481 Bone marrow transplant status: Secondary | ICD-10-CM

## 2019-05-28 DIAGNOSIS — Z5112 Encounter for antineoplastic immunotherapy: Secondary | ICD-10-CM | POA: Diagnosis not present

## 2019-05-28 LAB — CMP (CANCER CENTER ONLY)
ALT: 10 U/L (ref 0–44)
AST: 17 U/L (ref 15–41)
Albumin: 3.7 g/dL (ref 3.5–5.0)
Alkaline Phosphatase: 64 U/L (ref 38–126)
Anion gap: 9 (ref 5–15)
BUN: 12 mg/dL (ref 8–23)
CO2: 24 mmol/L (ref 22–32)
Calcium: 8.7 mg/dL — ABNORMAL LOW (ref 8.9–10.3)
Chloride: 109 mmol/L (ref 98–111)
Creatinine: 0.97 mg/dL (ref 0.44–1.00)
GFR, Est AFR Am: >60 mL/min (ref >60)
GFR, Estimated: 60 mL/min — ABNORMAL LOW (ref >60)
Glucose, Bld: 79 mg/dL (ref 70–99)
Potassium: 4.1 mmol/L (ref 3.5–5.1)
Sodium: 142 mmol/L (ref 135–145)
Total Bilirubin: 0.6 mg/dL (ref 0.3–1.2)
Total Protein: 6.3 g/dL — ABNORMAL LOW (ref 6.5–8.1)

## 2019-05-28 LAB — CBC WITH DIFFERENTIAL (CANCER CENTER ONLY)
Abs Immature Granulocytes: 0.01 10*3/uL (ref 0.00–0.07)
Basophils Absolute: 0 10*3/uL (ref 0.0–0.1)
Basophils Relative: 0 %
Eosinophils Absolute: 0.1 10*3/uL (ref 0.0–0.5)
Eosinophils Relative: 2 %
HCT: 32.7 % — ABNORMAL LOW (ref 36.0–46.0)
Hemoglobin: 10.7 g/dL — ABNORMAL LOW (ref 12.0–15.0)
Immature Granulocytes: 0 %
Lymphocytes Relative: 35 %
Lymphs Abs: 1.2 10*3/uL (ref 0.7–4.0)
MCH: 29.6 pg (ref 26.0–34.0)
MCHC: 32.7 g/dL (ref 30.0–36.0)
MCV: 90.3 fL (ref 80.0–100.0)
Monocytes Absolute: 0.4 10*3/uL (ref 0.1–1.0)
Monocytes Relative: 12 %
Neutro Abs: 1.7 10*3/uL (ref 1.7–7.7)
Neutrophils Relative %: 51 %
Platelet Count: 182 10*3/uL (ref 150–400)
RBC: 3.62 MIL/uL — ABNORMAL LOW (ref 3.87–5.11)
RDW: 16.4 % — ABNORMAL HIGH (ref 11.5–15.5)
WBC Count: 3.4 10*3/uL — ABNORMAL LOW (ref 4.0–10.5)
nRBC: 0 % (ref 0.0–0.2)

## 2019-05-28 MED ORDER — ACETAMINOPHEN 325 MG PO TABS
650.0000 mg | ORAL_TABLET | Freq: Once | ORAL | Status: AC
  Administered 2019-05-28: 650 mg via ORAL

## 2019-05-28 MED ORDER — DEXAMETHASONE 4 MG PO TABS
20.0000 mg | ORAL_TABLET | Freq: Once | ORAL | Status: AC
  Administered 2019-05-28: 09:00:00 20 mg via ORAL

## 2019-05-28 MED ORDER — PROCHLORPERAZINE MALEATE 10 MG PO TABS
ORAL_TABLET | ORAL | Status: AC
  Filled 2019-05-28: qty 1

## 2019-05-28 MED ORDER — DARATUMUMAB-HYALURONIDASE-FIHJ 1800-30000 MG-UT/15ML ~~LOC~~ SOLN
1800.0000 mg | Freq: Once | SUBCUTANEOUS | Status: AC
  Administered 2019-05-28: 11:00:00 1800 mg via SUBCUTANEOUS
  Filled 2019-05-28: qty 15

## 2019-05-28 MED ORDER — DEXAMETHASONE 4 MG PO TABS
ORAL_TABLET | ORAL | Status: AC
Start: 2019-05-28 — End: ?
  Filled 2019-05-28: qty 5

## 2019-05-28 MED ORDER — PROCHLORPERAZINE MALEATE 10 MG PO TABS
10.0000 mg | ORAL_TABLET | Freq: Once | ORAL | Status: AC
  Administered 2019-05-28: 10:00:00 10 mg via ORAL

## 2019-05-28 MED ORDER — ACETAMINOPHEN 325 MG PO TABS
ORAL_TABLET | ORAL | Status: AC
  Filled 2019-05-28: qty 2

## 2019-05-28 MED ORDER — DIPHENHYDRAMINE HCL 25 MG PO CAPS
ORAL_CAPSULE | ORAL | Status: AC
Start: 2019-05-28 — End: ?
  Filled 2019-05-28: qty 1

## 2019-05-28 MED ORDER — DIPHENHYDRAMINE HCL 25 MG PO CAPS
25.0000 mg | ORAL_CAPSULE | Freq: Once | ORAL | Status: AC
  Administered 2019-05-28: 09:00:00 25 mg via ORAL

## 2019-05-28 MED ORDER — BORTEZOMIB CHEMO SQ INJECTION 3.5 MG (2.5MG/ML)
1.3000 mg/m2 | Freq: Once | INTRAMUSCULAR | Status: AC
  Administered 2019-05-28: 1.75 mg via SUBCUTANEOUS
  Filled 2019-05-28: qty 0.7

## 2019-05-28 MED ORDER — MONTELUKAST SODIUM 10 MG PO TABS
ORAL_TABLET | ORAL | Status: AC
Start: 2019-05-28 — End: ?
  Filled 2019-05-28: qty 1

## 2019-05-28 MED ORDER — MONTELUKAST SODIUM 10 MG PO TABS
10.0000 mg | ORAL_TABLET | Freq: Once | ORAL | Status: AC
  Administered 2019-05-28: 09:00:00 10 mg via ORAL

## 2019-05-28 NOTE — Patient Instructions (Signed)
[facility] Discharge Instructions for Patients Receiving Chemotherapy  Today you received the following chemotherapy agents: Darzalex Faspro, Velcade   To help prevent nausea and vomiting after your treatment, we encourage you to take your nausea medication as directed.    If you develop nausea and vomiting that is not controlled by your nausea medication, call the clinic.   BELOW ARE SYMPTOMS THAT SHOULD BE REPORTED IMMEDIATELY:  *FEVER GREATER THAN 100.5 F  *CHILLS WITH OR WITHOUT FEVER  NAUSEA AND VOMITING THAT IS NOT CONTROLLED WITH YOUR NAUSEA MEDICATION  *UNUSUAL SHORTNESS OF BREATH  *UNUSUAL BRUISING OR BLEEDING  TENDERNESS IN MOUTH AND THROAT WITH OR WITHOUT PRESENCE OF ULCERS  *URINARY PROBLEMS  *BOWEL PROBLEMS  UNUSUAL RASH Items with * indicate a potential emergency and should be followed up as soon as possible.  Feel free to call the clinic should you have any questions or concerns. The clinic phone number is [phone].  Please show the [facility] at check-in to the Emergency Department and triage nurse.

## 2019-05-28 NOTE — Progress Notes (Signed)
Pt observed 1 hr post Darzalex Faspro injection. Tolerated well with no s/sx of reaction.

## 2019-06-04 ENCOUNTER — Other Ambulatory Visit: Payer: Self-pay

## 2019-06-04 ENCOUNTER — Inpatient Hospital Stay: Payer: Medicare Other

## 2019-06-04 VITALS — BP 166/100 | HR 73 | Temp 98.4°F | Resp 18

## 2019-06-04 DIAGNOSIS — Z9481 Bone marrow transplant status: Secondary | ICD-10-CM

## 2019-06-04 DIAGNOSIS — Z5112 Encounter for antineoplastic immunotherapy: Secondary | ICD-10-CM | POA: Diagnosis not present

## 2019-06-04 DIAGNOSIS — C9001 Multiple myeloma in remission: Secondary | ICD-10-CM

## 2019-06-04 DIAGNOSIS — Z7189 Other specified counseling: Secondary | ICD-10-CM

## 2019-06-04 LAB — CBC WITH DIFFERENTIAL (CANCER CENTER ONLY)
Abs Immature Granulocytes: 0.02 10*3/uL (ref 0.00–0.07)
Basophils Absolute: 0 10*3/uL (ref 0.0–0.1)
Basophils Relative: 1 %
Eosinophils Absolute: 0.1 10*3/uL (ref 0.0–0.5)
Eosinophils Relative: 2 %
HCT: 33.4 % — ABNORMAL LOW (ref 36.0–46.0)
Hemoglobin: 10.9 g/dL — ABNORMAL LOW (ref 12.0–15.0)
Immature Granulocytes: 1 %
Lymphocytes Relative: 31 %
Lymphs Abs: 1.1 10*3/uL (ref 0.7–4.0)
MCH: 29.8 pg (ref 26.0–34.0)
MCHC: 32.6 g/dL (ref 30.0–36.0)
MCV: 91.3 fL (ref 80.0–100.0)
Monocytes Absolute: 0.5 10*3/uL (ref 0.1–1.0)
Monocytes Relative: 13 %
Neutro Abs: 1.9 10*3/uL (ref 1.7–7.7)
Neutrophils Relative %: 52 %
Platelet Count: 206 10*3/uL (ref 150–400)
RBC: 3.66 MIL/uL — ABNORMAL LOW (ref 3.87–5.11)
RDW: 16.3 % — ABNORMAL HIGH (ref 11.5–15.5)
WBC Count: 3.6 10*3/uL — ABNORMAL LOW (ref 4.0–10.5)
nRBC: 0 % (ref 0.0–0.2)

## 2019-06-04 LAB — CMP (CANCER CENTER ONLY)
ALT: 14 U/L (ref 0–44)
AST: 19 U/L (ref 15–41)
Albumin: 3.8 g/dL (ref 3.5–5.0)
Alkaline Phosphatase: 68 U/L (ref 38–126)
Anion gap: 13 (ref 5–15)
BUN: 10 mg/dL (ref 8–23)
CO2: 23 mmol/L (ref 22–32)
Calcium: 8.9 mg/dL (ref 8.9–10.3)
Chloride: 108 mmol/L (ref 98–111)
Creatinine: 0.95 mg/dL (ref 0.44–1.00)
GFR, Est AFR Am: >60 mL/min (ref >60)
GFR, Estimated: >60 mL/min (ref >60)
Glucose, Bld: 77 mg/dL (ref 70–99)
Potassium: 4.1 mmol/L (ref 3.5–5.1)
Sodium: 144 mmol/L (ref 135–145)
Total Bilirubin: 0.4 mg/dL (ref 0.3–1.2)
Total Protein: 6.6 g/dL (ref 6.5–8.1)

## 2019-06-04 MED ORDER — ACETAMINOPHEN 325 MG PO TABS
650.0000 mg | ORAL_TABLET | Freq: Once | ORAL | Status: AC
  Administered 2019-06-04: 650 mg via ORAL

## 2019-06-04 MED ORDER — PROCHLORPERAZINE MALEATE 10 MG PO TABS
10.0000 mg | ORAL_TABLET | Freq: Once | ORAL | Status: AC
  Administered 2019-06-04: 10 mg via ORAL

## 2019-06-04 MED ORDER — ACETAMINOPHEN 325 MG PO TABS
ORAL_TABLET | ORAL | Status: AC
  Filled 2019-06-04: qty 2

## 2019-06-04 MED ORDER — DIPHENHYDRAMINE HCL 25 MG PO CAPS
ORAL_CAPSULE | ORAL | Status: AC
  Filled 2019-06-04: qty 1

## 2019-06-04 MED ORDER — DEXAMETHASONE 4 MG PO TABS
20.0000 mg | ORAL_TABLET | Freq: Once | ORAL | Status: AC
  Administered 2019-06-04: 20 mg via ORAL

## 2019-06-04 MED ORDER — PROCHLORPERAZINE MALEATE 10 MG PO TABS
ORAL_TABLET | ORAL | Status: AC
  Filled 2019-06-04: qty 1

## 2019-06-04 MED ORDER — DEXAMETHASONE 4 MG PO TABS
ORAL_TABLET | ORAL | Status: AC
  Filled 2019-06-04: qty 5

## 2019-06-04 MED ORDER — MONTELUKAST SODIUM 10 MG PO TABS
ORAL_TABLET | ORAL | Status: AC
  Filled 2019-06-04: qty 1

## 2019-06-04 MED ORDER — DARATUMUMAB-HYALURONIDASE-FIHJ 1800-30000 MG-UT/15ML ~~LOC~~ SOLN
1800.0000 mg | Freq: Once | SUBCUTANEOUS | Status: AC
  Administered 2019-06-04: 1800 mg via SUBCUTANEOUS
  Filled 2019-06-04: qty 15

## 2019-06-04 MED ORDER — MONTELUKAST SODIUM 10 MG PO TABS
10.0000 mg | ORAL_TABLET | Freq: Once | ORAL | Status: AC
  Administered 2019-06-04: 10 mg via ORAL

## 2019-06-04 MED ORDER — DIPHENHYDRAMINE HCL 25 MG PO CAPS
25.0000 mg | ORAL_CAPSULE | Freq: Once | ORAL | Status: AC
  Administered 2019-06-04: 25 mg via ORAL

## 2019-06-04 MED ORDER — BORTEZOMIB CHEMO SQ INJECTION 3.5 MG (2.5MG/ML)
1.3000 mg/m2 | Freq: Once | INTRAMUSCULAR | Status: AC
  Administered 2019-06-04: 1.75 mg via SUBCUTANEOUS
  Filled 2019-06-04: qty 0.7

## 2019-06-04 NOTE — Patient Instructions (Signed)
[facility] Discharge Instructions for Patients Receiving Chemotherapy  Today you received the following chemotherapy agents Darzalex Faspro and Velcade  To help prevent nausea and vomiting after your treatment, we encourage you to take your nausea medication as directed.    If you develop nausea and vomiting that is not controlled by your nausea medication, call the clinic.   BELOW ARE SYMPTOMS THAT SHOULD BE REPORTED IMMEDIATELY:  *FEVER GREATER THAN 100.5 F  *CHILLS WITH OR WITHOUT FEVER  NAUSEA AND VOMITING THAT IS NOT CONTROLLED WITH YOUR NAUSEA MEDICATION  *UNUSUAL SHORTNESS OF BREATH  *UNUSUAL BRUISING OR BLEEDING  TENDERNESS IN MOUTH AND THROAT WITH OR WITHOUT PRESENCE OF ULCERS  *URINARY PROBLEMS  *BOWEL PROBLEMS  UNUSUAL RASH Items with * indicate a potential emergency and should be followed up as soon as possible.  Feel free to call the clinic should you have any questions or concerns. The clinic phone number is [phone].  Please show the CHEMO ALERT CARD at check-in to the Emergency Department and triage nurse.   

## 2019-06-04 NOTE — Progress Notes (Signed)
Pt stayed for 1 hour post-observation after Darzalex Faspro. Pt tolerated treatment well without concern/complaint.

## 2019-06-05 LAB — KAPPA/LAMBDA LIGHT CHAINS
Kappa free light chain: 4 mg/L (ref 3.3–19.4)
Kappa, lambda light chain ratio: >2.67 — ABNORMAL HIGH (ref 0.26–1.65)
Lambda free light chains: <1.5 mg/L — ABNORMAL LOW (ref 5.7–26.3)

## 2019-06-06 LAB — MULTIPLE MYELOMA PANEL, SERUM
Albumin SerPl Elph-Mcnc: 3.8 g/dL (ref 2.9–4.4)
Albumin/Glob SerPl: 1.6 (ref 0.7–1.7)
Alpha 1: 0.2 g/dL (ref 0.0–0.4)
Alpha2 Glob SerPl Elph-Mcnc: 1 g/dL (ref 0.4–1.0)
B-Globulin SerPl Elph-Mcnc: 0.9 g/dL (ref 0.7–1.3)
Gamma Glob SerPl Elph-Mcnc: 0.3 g/dL — ABNORMAL LOW (ref 0.4–1.8)
Globulin, Total: 2.4 g/dL (ref 2.2–3.9)
IgA: 9 mg/dL — ABNORMAL LOW (ref 87–352)
IgG (Immunoglobin G), Serum: 379 mg/dL — ABNORMAL LOW (ref 586–1602)
IgM (Immunoglobulin M), Srm: 10 mg/dL — ABNORMAL LOW (ref 26–217)
M Protein SerPl Elph-Mcnc: 0.2 g/dL — ABNORMAL HIGH
Total Protein ELP: 6.2 g/dL (ref 6.0–8.5)

## 2019-06-11 ENCOUNTER — Other Ambulatory Visit: Payer: Self-pay

## 2019-06-11 ENCOUNTER — Inpatient Hospital Stay (HOSPITAL_BASED_OUTPATIENT_CLINIC_OR_DEPARTMENT_OTHER): Payer: Medicare Other | Admitting: Hematology and Oncology

## 2019-06-11 ENCOUNTER — Encounter: Payer: Self-pay | Admitting: Hematology and Oncology

## 2019-06-11 ENCOUNTER — Other Ambulatory Visit: Payer: Self-pay | Admitting: Hematology and Oncology

## 2019-06-11 ENCOUNTER — Inpatient Hospital Stay: Payer: Medicare Other

## 2019-06-11 ENCOUNTER — Telehealth: Payer: Self-pay | Admitting: Hematology and Oncology

## 2019-06-11 ENCOUNTER — Inpatient Hospital Stay: Payer: Medicare Other | Attending: Hematology and Oncology

## 2019-06-11 VITALS — BP 129/80 | HR 82 | Temp 98.7°F | Resp 18

## 2019-06-11 VITALS — BP 159/103 | HR 83 | Temp 98.1°F | Resp 18 | Ht 60.0 in

## 2019-06-11 DIAGNOSIS — D6481 Anemia due to antineoplastic chemotherapy: Secondary | ICD-10-CM | POA: Insufficient documentation

## 2019-06-11 DIAGNOSIS — Z880 Allergy status to penicillin: Secondary | ICD-10-CM | POA: Insufficient documentation

## 2019-06-11 DIAGNOSIS — R030 Elevated blood-pressure reading, without diagnosis of hypertension: Secondary | ICD-10-CM | POA: Diagnosis not present

## 2019-06-11 DIAGNOSIS — D638 Anemia in other chronic diseases classified elsewhere: Secondary | ICD-10-CM | POA: Diagnosis not present

## 2019-06-11 DIAGNOSIS — Z79899 Other long term (current) drug therapy: Secondary | ICD-10-CM | POA: Insufficient documentation

## 2019-06-11 DIAGNOSIS — Z9481 Bone marrow transplant status: Secondary | ICD-10-CM

## 2019-06-11 DIAGNOSIS — C9001 Multiple myeloma in remission: Secondary | ICD-10-CM | POA: Diagnosis not present

## 2019-06-11 DIAGNOSIS — Z885 Allergy status to narcotic agent status: Secondary | ICD-10-CM | POA: Insufficient documentation

## 2019-06-11 DIAGNOSIS — Z881 Allergy status to other antibiotic agents status: Secondary | ICD-10-CM | POA: Diagnosis not present

## 2019-06-11 DIAGNOSIS — R42 Dizziness and giddiness: Secondary | ICD-10-CM | POA: Diagnosis not present

## 2019-06-11 DIAGNOSIS — Z886 Allergy status to analgesic agent status: Secondary | ICD-10-CM | POA: Insufficient documentation

## 2019-06-11 DIAGNOSIS — Z9221 Personal history of antineoplastic chemotherapy: Secondary | ICD-10-CM | POA: Insufficient documentation

## 2019-06-11 DIAGNOSIS — Z923 Personal history of irradiation: Secondary | ICD-10-CM | POA: Diagnosis not present

## 2019-06-11 DIAGNOSIS — J984 Other disorders of lung: Secondary | ICD-10-CM | POA: Diagnosis not present

## 2019-06-11 DIAGNOSIS — Z7189 Other specified counseling: Secondary | ICD-10-CM

## 2019-06-11 DIAGNOSIS — Z5112 Encounter for antineoplastic immunotherapy: Secondary | ICD-10-CM | POA: Insufficient documentation

## 2019-06-11 LAB — CBC WITH DIFFERENTIAL (CANCER CENTER ONLY)
Abs Immature Granulocytes: 0.03 10*3/uL (ref 0.00–0.07)
Basophils Absolute: 0 10*3/uL (ref 0.0–0.1)
Basophils Relative: 0 %
Eosinophils Absolute: 0.1 10*3/uL (ref 0.0–0.5)
Eosinophils Relative: 1 %
HCT: 34.8 % — ABNORMAL LOW (ref 36.0–46.0)
Hemoglobin: 11.2 g/dL — ABNORMAL LOW (ref 12.0–15.0)
Immature Granulocytes: 1 %
Lymphocytes Relative: 31 %
Lymphs Abs: 1.5 10*3/uL (ref 0.7–4.0)
MCH: 29.3 pg (ref 26.0–34.0)
MCHC: 32.2 g/dL (ref 30.0–36.0)
MCV: 91.1 fL (ref 80.0–100.0)
Monocytes Absolute: 0.5 10*3/uL (ref 0.1–1.0)
Monocytes Relative: 11 %
Neutro Abs: 2.7 10*3/uL (ref 1.7–7.7)
Neutrophils Relative %: 56 %
Platelet Count: 225 10*3/uL (ref 150–400)
RBC: 3.82 MIL/uL — ABNORMAL LOW (ref 3.87–5.11)
RDW: 16.4 % — ABNORMAL HIGH (ref 11.5–15.5)
WBC Count: 4.8 10*3/uL (ref 4.0–10.5)
nRBC: 0 % (ref 0.0–0.2)

## 2019-06-11 LAB — CMP (CANCER CENTER ONLY)
ALT: 13 U/L (ref 0–44)
AST: 17 U/L (ref 15–41)
Albumin: 4.1 g/dL (ref 3.5–5.0)
Alkaline Phosphatase: 73 U/L (ref 38–126)
Anion gap: 10 (ref 5–15)
BUN: 14 mg/dL (ref 8–23)
CO2: 22 mmol/L (ref 22–32)
Calcium: 9 mg/dL (ref 8.9–10.3)
Chloride: 108 mmol/L (ref 98–111)
Creatinine: 0.99 mg/dL (ref 0.44–1.00)
GFR, Est AFR Am: >60 mL/min (ref >60)
GFR, Estimated: 58 mL/min — ABNORMAL LOW (ref >60)
Glucose, Bld: 79 mg/dL (ref 70–99)
Potassium: 4.5 mmol/L (ref 3.5–5.1)
Sodium: 140 mmol/L (ref 135–145)
Total Bilirubin: 0.6 mg/dL (ref 0.3–1.2)
Total Protein: 7 g/dL (ref 6.5–8.1)

## 2019-06-11 MED ORDER — BORTEZOMIB CHEMO SQ INJECTION 3.5 MG (2.5MG/ML)
1.3000 mg/m2 | Freq: Once | INTRAMUSCULAR | Status: AC
  Administered 2019-06-11: 12:00:00 1.75 mg via SUBCUTANEOUS
  Filled 2019-06-11: qty 0.7

## 2019-06-11 MED ORDER — ACETAMINOPHEN 325 MG PO TABS
650.0000 mg | ORAL_TABLET | Freq: Once | ORAL | Status: AC
  Administered 2019-06-11: 650 mg via ORAL

## 2019-06-11 MED ORDER — DIPHENHYDRAMINE HCL 25 MG PO CAPS
25.0000 mg | ORAL_CAPSULE | Freq: Once | ORAL | Status: AC
  Administered 2019-06-11: 25 mg via ORAL

## 2019-06-11 MED ORDER — ACETAMINOPHEN 325 MG PO TABS
ORAL_TABLET | ORAL | Status: AC
  Filled 2019-06-11: qty 2

## 2019-06-11 MED ORDER — PROCHLORPERAZINE MALEATE 10 MG PO TABS
10.0000 mg | ORAL_TABLET | Freq: Once | ORAL | Status: AC
  Administered 2019-06-11: 10 mg via ORAL

## 2019-06-11 MED ORDER — HYDROCHLOROTHIAZIDE 12.5 MG PO TABS: 12.5000 mg | ORAL_TABLET | Freq: Every day | ORAL | 11 refills | Status: DC

## 2019-06-11 MED ORDER — MONTELUKAST SODIUM 10 MG PO TABS
ORAL_TABLET | ORAL | Status: AC
  Filled 2019-06-11: qty 1

## 2019-06-11 MED ORDER — DEXAMETHASONE 4 MG PO TABS
12.0000 mg | ORAL_TABLET | Freq: Once | ORAL | Status: AC
  Administered 2019-06-11: 12 mg via ORAL

## 2019-06-11 MED ORDER — PROCHLORPERAZINE MALEATE 10 MG PO TABS
ORAL_TABLET | ORAL | Status: AC
  Filled 2019-06-11: qty 1

## 2019-06-11 MED ORDER — MONTELUKAST SODIUM 10 MG PO TABS
10.0000 mg | ORAL_TABLET | Freq: Once | ORAL | Status: AC
  Administered 2019-06-11: 10 mg via ORAL

## 2019-06-11 MED ORDER — DIPHENHYDRAMINE HCL 25 MG PO CAPS
ORAL_CAPSULE | ORAL | Status: AC
  Filled 2019-06-11: qty 1

## 2019-06-11 MED ORDER — DARATUMUMAB-HYALURONIDASE-FIHJ 1800-30000 MG-UT/15ML ~~LOC~~ SOLN
1800.0000 mg | Freq: Once | SUBCUTANEOUS | Status: AC
  Administered 2019-06-11: 12:00:00 1800 mg via SUBCUTANEOUS
  Filled 2019-06-11: qty 15

## 2019-06-11 MED ORDER — DEXAMETHASONE 4 MG PO TABS
ORAL_TABLET | ORAL | Status: AC
  Filled 2019-06-11: qty 5

## 2019-06-11 NOTE — Assessment & Plan Note (Signed)
She has persistent elevated blood pressure and asymptomatic I recommend trial of low-dose hydrochlorothiazide I recommend her to check her blood pressure twice a day

## 2019-06-11 NOTE — Telephone Encounter (Signed)
Scheduled appt per 11/2 sch message - pt to get an updated schedule in treatment area - RN Seth Bake aware to print schedule.

## 2019-06-11 NOTE — Progress Notes (Signed)
Patient observed for 1 hour post Darzalex Faspro and then [place] home.

## 2019-06-11 NOTE — Assessment & Plan Note (Signed)
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  Observe  

## 2019-06-11 NOTE — Patient Instructions (Signed)
[facility] Discharge Instructions for Patients Receiving Chemotherapy  Today you received the following chemotherapy agents: Velcade, Darzalex Faspro  To help prevent nausea and vomiting after your treatment, we encourage you to take your nausea medication as directed.   If you develop nausea and vomiting that is not controlled by your nausea medication, call the clinic.   BELOW ARE SYMPTOMS THAT SHOULD BE REPORTED IMMEDIATELY:  *FEVER GREATER THAN 100.5 F  *CHILLS WITH OR WITHOUT FEVER  NAUSEA AND VOMITING THAT IS NOT CONTROLLED WITH YOUR NAUSEA MEDICATION  *UNUSUAL SHORTNESS OF BREATH  *UNUSUAL BRUISING OR BLEEDING  TENDERNESS IN MOUTH AND THROAT WITH OR WITHOUT PRESENCE OF ULCERS  *URINARY PROBLEMS  *BOWEL PROBLEMS  UNUSUAL RASH Items with * indicate a potential emergency and should be followed up as soon as possible.  Feel free to call the clinic should you have any questions or concerns. The clinic phone number is [phone].  Please show the CHEMO ALERT CARD at check-in to the Emergency Department and triage nurse.   

## 2019-06-11 NOTE — Progress Notes (Signed)
[place] OFFICE PROGRESS NOTE  Patient Care Team: Josetta Huddle, MD as PCP - General (Internal Medicine) Ginette Pitman, MD as Consulting Physician (Hematology and Oncology) Hessie Dibble, MD as Consulting Physician (Hematology and Oncology)  ASSESSMENT & PLAN:  Multiple myeloma in remission [facility]) She tolerated treatment well except for hypertension which I suspect is not related to treatment We will continue her current treatment as schedule By the end of this month, she will go on treatment every other week She will continue calcium with vitamin D and acyclovir for antimicrobial prophylaxis I recommend dental clearance I plan to reduce premedication oral dexamethasone for future treatment  Elevated blood pressure reading in office without diagnosis of hypertension She has persistent elevated blood pressure and asymptomatic I recommend trial of low-dose hydrochlorothiazide I recommend her to check her blood pressure twice a day  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  Observe   No orders of the defined types were placed in this encounter.   INTERVAL HISTORY: Please see below for problem oriented charting. She returns for further follow-up She is symptomatic with some occasional dizziness She is noted to have elevated blood pressure on a regular basis No recent infection, fever or chills Denies infusion reaction  SUMMARY OF ONCOLOGIC HISTORY: Oncology History Overview Note   M-protein 0.69 gm/dl IFIX - IgG, Kappa IgG - 868 IgA - 19 IgM - < 20 Kappa - 21 Lambda - 5.7  09/06/2014 - Bone marrow aspirate and biopsy:   Normocellular marrow for age (40%) with a small monoclonal plasma cell population (1% on aspirate). Karyotype 55, XX  FISH Negative for myeloma associated changes  09/12/2014 - PET/CT  Two regions that are concerning for  disease, one adjacent/involving the left ninth rib and one in the marrow of the right femur, in this patient with history of plasmacytoma.    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   04/01/2015 Bone Marrow Transplant   She received melphalan chemotherapy on 03/31/2015 followed by autologous stem cell transplant the day after   04/03/2015 - 04/18/2015 Hospital Admission   The patient was admitted to the hospital at [place] for management related to complication from stem cell transplant. She had significant nausea requiring intravenous anti-emetics.   07/17/2015 -  Chemotherapy   She started maintenance Revlimid and monthly zometa, then every 3 months  06/01/2018 Imaging   DEXA scan showed bone density T score in femur -2.3   04/20/2019 PET scan   1. No FDG avid osseous lesions or mass identified to suggest metabolically active lesion of myeloma or plasmacytoma. 2. Small nodular density within the paravertebral right lower lobe exhibits mild to  moderate increased uptake within SUV max of 3.38. This is indeterminate. Review of CT chest from 10/05/2018 shows a corresponding Lung nodule in this area measuring the same. Small pulmonary neoplasm cannot be excluded. 3. Indeterminate, focal area of increased uptake is identified within the thoracic canal. Indeterminate favored to represent benign physiologic CNS activity.     04/30/2019 -  Chemotherapy   The patient had daratumumab-hyaluronidase-fihj (DARZALEX FASPRO) 1800-30000 MG-UT/15ML chemo SQ injection 1,800 mg, 1,800 mg, Subcutaneous,  Once, 2 of 9 cycles Administration: 1,800 mg (04/30/2019), 1,800 mg (05/07/2019), 1,800 mg (05/14/2019), 1,800 mg (05/21/2019), 1,800 mg (05/28/2019), 1,800 mg (06/04/2019)  for chemotherapy treatment.    04/30/2019 -  Chemotherapy   The patient had bortezomib SQ (VELCADE) chemo injection 1.75 mg, 1.3 mg/m2 = 1.75 mg, Subcutaneous,  Once, 7 of 11 cycles Administration: 1.75 mg (04/30/2019), 1.75 mg (05/07/2019), 1.75 mg (05/14/2019), 1.75 mg (05/21/2019), 1.75 mg (05/28/2019), 1.75 mg (06/04/2019)  for chemotherapy treatment.      REVIEW OF SYSTEMS:   Constitutional: Denies fevers, chills or abnormal weight loss Eyes: Denies blurriness of vision Ears, nose, mouth, throat, and face: Denies mucositis or sore throat Respiratory: Denies cough, dyspnea or wheezes Cardiovascular: Denies palpitation, chest discomfort or lower extremity swelling Gastrointestinal:  Denies nausea, heartburn or change in bowel habits Skin: Denies abnormal skin rashes Lymphatics: Denies new lymphadenopathy or easy bruising Neurological:Denies numbness, tingling or new weaknesses Behavioral/Psych: Mood is stable, no new changes  All other systems were reviewed with the patient and are negative.  I have reviewed the past medical history, past surgical history, social history and family history with the patient and they are unchanged from previous note.  ALLERGIES:  is allergic to  augmentin [amoxicillin-pot clavulanate]; albuterol; codeine; ibuprofen; nsaids; and tolmetin.  MEDICATIONS:  Current Outpatient Medications  Medication Sig Dispense Refill  . acetaminophen (TYLENOL) 500 MG tablet Take 500-1,000 mg by mouth every 6 (six) hours as needed for mild pain, moderate pain, fever or headache.    Marland Kitchen acyclovir (ZOVIRAX) 400 MG tablet Take 1 tablet (400 mg total) by mouth 2 (two) times daily. 180 tablet 3  . aspirin EC 81 MG tablet Take 81 mg by mouth daily with breakfast.    . calcium carbonate (OSCAL) 1500 (600 Ca) MG TABS tablet Take 1,500 mg by mouth daily with breakfast.    . Calcium Carbonate 500 MG CHEW Chew 500 mg by mouth as needed for indigestion.    . Cholecalciferol (VITAMIN D3) 2000 units capsule Take 2,000 Units by mouth daily.     . hydrochlorothiazide (HYDRODIURIL) 12.5 MG tablet Take 1 tablet (12.5 mg total) by mouth daily. 30 tablet 11  . HYDROcodone-acetaminophen (NORCO/VICODIN) 5-325 MG tablet Take 1 tablet by mouth every 6 (six) hours as needed for moderate pain. 10 tablet 0  . Multiple Vitamins-Minerals (CENTRUM SILVER PO) Take 1 tablet by mouth daily.     Marland Kitchen omeprazole (PRILOSEC) 20 MG capsule Take 1 capsule (20 mg total) by mouth daily. 90 capsule 11  . ondansetron (ZOFRAN ODT) 4 MG disintegrating tablet Take 1 tablet (4 mg total) by mouth every 8 (eight) hours as needed for nausea or vomiting. 12 tablet 1  . ondansetron (  ZOFRAN) 8 MG tablet Take 1 tablet (8 mg total) by mouth every 8 (eight) hours as needed for nausea or vomiting. 60 tablet 1  . senna-docusate (SENOKOT-S) 8.6-50 MG tablet Take 1-2 tablets by mouth daily as needed for mild constipation or moderate constipation.     . sodium chloride (OCEAN) 0.65 % SOLN nasal spray Place 1 spray into both nostrils as needed for congestion.     No current facility-administered medications for this visit.    Facility-Administered Medications Ordered in Other Visits  Medication Dose Route Frequency  Provider Last Rate Last Dose  . 0.9 %  sodium chloride infusion   Intravenous Once Alvy Bimler, Ramie Erman, MD      . bortezomib SQ (VELCADE) chemo injection 1.75 mg  1.3 mg/m2 (Treatment Plan Recorded) Subcutaneous Once Alvy Bimler, Africa Masaki, MD      . daratumumab-hyaluronidase-fihj (DARZALEX FASPRO) 1800-30000 MG-UT/15ML chemo SQ injection 1,800 mg  1,800 mg Subcutaneous Once Alvy Bimler, Ramses Klecka, MD        PHYSICAL EXAMINATION: ECOG PERFORMANCE STATUS: 1 - Symptomatic but completely ambulatory  Vitals:   06/11/19 1020  BP: (!) 159/103  Pulse: 83  Resp: 18  Temp: 98.1 F (36.7 C)  SpO2: 100%   There were no vitals filed for this visit.  GENERAL:alert, no distress and comfortable SKIN: skin color, texture, turgor are normal, no rashes or significant lesions EYES: normal, Conjunctiva are pink and non-injected, sclera clear OROPHARYNX:no exudate, no erythema and lips, buccal mucosa, and tongue normal  NECK: supple, thyroid normal size, non-tender, without nodularity LYMPH:  no palpable lymphadenopathy in the cervical, axillary or inguinal LUNGS: clear to auscultation and percussion with normal breathing effort HEART: regular rate & rhythm and no murmurs and no lower extremity edema ABDOMEN:abdomen soft, non-tender and normal bowel sounds Musculoskeletal:no cyanosis of digits and no clubbing  NEURO: alert & oriented x 3 with fluent speech, no focal motor/sensory deficits  LABORATORY DATA:  I have reviewed the data as listed    Component Value Date/Time   NA 140 06/11/2019 1011   NA 143 07/14/2017 1245   K 4.5 06/11/2019 1011   K 3.3 (L) 07/14/2017 1245   CL 108 06/11/2019 1011   CO2 22 06/11/2019 1011   CO2 20 (L) 07/14/2017 1245   GLUCOSE 79 06/11/2019 1011   GLUCOSE 106 07/14/2017 1245   BUN 14 06/11/2019 1011   BUN 9.3 07/14/2017 1245   CREATININE 0.99 06/11/2019 1011   CREATININE 0.8 07/14/2017 1245   CALCIUM 9.0 06/11/2019 1011   CALCIUM 8.4 07/14/2017 1245   PROT 7.0 06/11/2019 1011   PROT  6.0 07/14/2017 1245   PROT 6.2 (L) 07/14/2017 1245   ALBUMIN 4.1 06/11/2019 1011   ALBUMIN 3.6 07/14/2017 1245   AST 17 06/11/2019 1011   AST 21 07/14/2017 1245   ALT 13 06/11/2019 1011   ALT 21 07/14/2017 1245   ALKPHOS 73 06/11/2019 1011   ALKPHOS 78 07/14/2017 1245   BILITOT 0.6 06/11/2019 1011   BILITOT 0.43 07/14/2017 1245   GFRNONAA 58 (L) 06/11/2019 1011   GFRAA >60 06/11/2019 1011    No results found for: SPEP, UPEP  Lab Results  Component Value Date   WBC 4.8 06/11/2019   NEUTROABS 2.7 06/11/2019   HGB 11.2 (L) 06/11/2019   HCT 34.8 (L) 06/11/2019   MCV 91.1 06/11/2019   PLT 225 06/11/2019      Chemistry      Component Value Date/Time   NA 140 06/11/2019 1011   NA 143 07/14/2017  1245   K 4.5 06/11/2019 1011   K 3.3 (L) 07/14/2017 1245   CL 108 06/11/2019 1011   CO2 22 06/11/2019 1011   CO2 20 (L) 07/14/2017 1245   BUN 14 06/11/2019 1011   BUN 9.3 07/14/2017 1245   CREATININE 0.99 06/11/2019 1011   CREATININE 0.8 07/14/2017 1245      Component Value Date/Time   CALCIUM 9.0 06/11/2019 1011   CALCIUM 8.4 07/14/2017 1245   ALKPHOS 73 06/11/2019 1011   ALKPHOS 78 07/14/2017 1245   AST 17 06/11/2019 1011   AST 21 07/14/2017 1245   ALT 13 06/11/2019 1011   ALT 21 07/14/2017 1245   BILITOT 0.6 06/11/2019 1011   BILITOT 0.43 07/14/2017 1245       All questions were answered. The patient knows to call the clinic with any problems, questions or concerns. No barriers to learning was detected.  I spent 15 minutes counseling the patient face to face. The total time spent in the appointment was 20 minutes and more than 50% was on counseling and review of test results  [name], MD 06/11/2019 11:17 AM

## 2019-06-11 NOTE — Assessment & Plan Note (Signed)
She tolerated treatment well except for hypertension which I suspect is not related to treatment We will continue her current treatment as schedule By the end of this month, she will go on treatment every other week She will continue calcium with vitamin D and acyclovir for antimicrobial prophylaxis I recommend dental clearance I plan to reduce premedication oral dexamethasone for future treatment

## 2019-06-18 ENCOUNTER — Inpatient Hospital Stay: Payer: Medicare Other

## 2019-06-18 ENCOUNTER — Other Ambulatory Visit: Payer: Self-pay

## 2019-06-18 VITALS — BP 148/96 | HR 75 | Temp 97.9°F | Resp 18

## 2019-06-18 DIAGNOSIS — Z9481 Bone marrow transplant status: Secondary | ICD-10-CM

## 2019-06-18 DIAGNOSIS — Z5112 Encounter for antineoplastic immunotherapy: Secondary | ICD-10-CM | POA: Diagnosis not present

## 2019-06-18 DIAGNOSIS — Z7189 Other specified counseling: Secondary | ICD-10-CM

## 2019-06-18 DIAGNOSIS — C9001 Multiple myeloma in remission: Secondary | ICD-10-CM

## 2019-06-18 LAB — CBC WITH DIFFERENTIAL (CANCER CENTER ONLY)
Abs Immature Granulocytes: 0.02 10*3/uL (ref 0.00–0.07)
Basophils Absolute: 0 10*3/uL (ref 0.0–0.1)
Basophils Relative: 0 %
Eosinophils Absolute: 0.1 10*3/uL (ref 0.0–0.5)
Eosinophils Relative: 2 %
HCT: 33.5 % — ABNORMAL LOW (ref 36.0–46.0)
Hemoglobin: 11 g/dL — ABNORMAL LOW (ref 12.0–15.0)
Immature Granulocytes: 0 %
Lymphocytes Relative: 31 %
Lymphs Abs: 1.4 10*3/uL (ref 0.7–4.0)
MCH: 29.7 pg (ref 26.0–34.0)
MCHC: 32.8 g/dL (ref 30.0–36.0)
MCV: 90.5 fL (ref 80.0–100.0)
Monocytes Absolute: 0.6 10*3/uL (ref 0.1–1.0)
Monocytes Relative: 13 %
Neutro Abs: 2.4 10*3/uL (ref 1.7–7.7)
Neutrophils Relative %: 54 %
Platelet Count: 198 10*3/uL (ref 150–400)
RBC: 3.7 MIL/uL — ABNORMAL LOW (ref 3.87–5.11)
RDW: 15.9 % — ABNORMAL HIGH (ref 11.5–15.5)
WBC Count: 4.5 10*3/uL (ref 4.0–10.5)
nRBC: 0 % (ref 0.0–0.2)

## 2019-06-18 LAB — CMP (CANCER CENTER ONLY)
ALT: 12 U/L (ref 0–44)
AST: 17 U/L (ref 15–41)
Albumin: 4.1 g/dL (ref 3.5–5.0)
Alkaline Phosphatase: 78 U/L (ref 38–126)
Anion gap: 11 (ref 5–15)
BUN: 13 mg/dL (ref 8–23)
CO2: 25 mmol/L (ref 22–32)
Calcium: 9 mg/dL (ref 8.9–10.3)
Chloride: 105 mmol/L (ref 98–111)
Creatinine: 1.06 mg/dL — ABNORMAL HIGH (ref 0.44–1.00)
GFR, Est AFR Am: >60 mL/min (ref >60)
GFR, Estimated: 54 mL/min — ABNORMAL LOW (ref >60)
Glucose, Bld: 82 mg/dL (ref 70–99)
Potassium: 4.1 mmol/L (ref 3.5–5.1)
Sodium: 141 mmol/L (ref 135–145)
Total Bilirubin: 0.5 mg/dL (ref 0.3–1.2)
Total Protein: 6.7 g/dL (ref 6.5–8.1)

## 2019-06-18 MED ORDER — ACETAMINOPHEN 325 MG PO TABS
ORAL_TABLET | ORAL | Status: AC
  Filled 2019-06-18: qty 2

## 2019-06-18 MED ORDER — PROCHLORPERAZINE MALEATE 10 MG PO TABS
10.0000 mg | ORAL_TABLET | Freq: Once | ORAL | Status: AC
  Administered 2019-06-18: 10 mg via ORAL

## 2019-06-18 MED ORDER — DEXAMETHASONE 4 MG PO TABS
12.0000 mg | ORAL_TABLET | Freq: Once | ORAL | Status: AC
  Administered 2019-06-18: 12 mg via ORAL

## 2019-06-18 MED ORDER — PROCHLORPERAZINE MALEATE 10 MG PO TABS
ORAL_TABLET | ORAL | Status: AC
  Filled 2019-06-18: qty 1

## 2019-06-18 MED ORDER — DIPHENHYDRAMINE HCL 25 MG PO CAPS
25.0000 mg | ORAL_CAPSULE | Freq: Once | ORAL | Status: AC
  Administered 2019-06-18: 25 mg via ORAL

## 2019-06-18 MED ORDER — MONTELUKAST SODIUM 10 MG PO TABS
ORAL_TABLET | ORAL | Status: AC
  Filled 2019-06-18: qty 1

## 2019-06-18 MED ORDER — BORTEZOMIB CHEMO SQ INJECTION 3.5 MG (2.5MG/ML)
1.3000 mg/m2 | Freq: Once | INTRAMUSCULAR | Status: AC
  Administered 2019-06-18: 1.75 mg via SUBCUTANEOUS
  Filled 2019-06-18: qty 0.7

## 2019-06-18 MED ORDER — DEXAMETHASONE 4 MG PO TABS
ORAL_TABLET | ORAL | Status: AC
  Filled 2019-06-18: qty 3

## 2019-06-18 MED ORDER — DIPHENHYDRAMINE HCL 25 MG PO CAPS
ORAL_CAPSULE | ORAL | Status: AC
  Filled 2019-06-18: qty 1

## 2019-06-18 MED ORDER — ACETAMINOPHEN 325 MG PO TABS
650.0000 mg | ORAL_TABLET | Freq: Once | ORAL | Status: AC
  Administered 2019-06-18: 650 mg via ORAL

## 2019-06-18 MED ORDER — DARATUMUMAB-HYALURONIDASE-FIHJ 1800-30000 MG-UT/15ML ~~LOC~~ SOLN
1800.0000 mg | Freq: Once | SUBCUTANEOUS | Status: AC
  Administered 2019-06-18: 1800 mg via SUBCUTANEOUS
  Filled 2019-06-18: qty 15

## 2019-06-18 MED ORDER — MONTELUKAST SODIUM 10 MG PO TABS
10.0000 mg | ORAL_TABLET | Freq: Once | ORAL | Status: AC
  Administered 2019-06-18: 10 mg via ORAL

## 2019-06-18 NOTE — Patient Instructions (Addendum)
[facility] Discharge Instructions for Patients Receiving Chemotherapy  Today you received the following chemotherapy agents: Velcade and Darzalex Faspro  To help prevent nausea and vomiting after your treatment, we encourage you to take your nausea medication as directed.  If you develop nausea and vomiting that is not controlled by your nausea medication, call the clinic.   BELOW ARE SYMPTOMS THAT SHOULD BE REPORTED IMMEDIATELY:  *FEVER GREATER THAN 100.5 F  *CHILLS WITH OR WITHOUT FEVER  NAUSEA AND VOMITING THAT IS NOT CONTROLLED WITH YOUR NAUSEA MEDICATION  *UNUSUAL SHORTNESS OF BREATH  *UNUSUAL BRUISING OR BLEEDING  TENDERNESS IN MOUTH AND THROAT WITH OR WITHOUT PRESENCE OF ULCERS  *URINARY PROBLEMS  *BOWEL PROBLEMS  UNUSUAL RASH Items with * indicate a potential emergency and should be followed up as soon as possible.  Feel free to call the clinic should you have any questions or concerns. The clinic phone number is [phone].  Please show the CHEMO ALERT CARD at check-in to the Emergency Department and triage nurse.   

## 2019-06-19 LAB — KAPPA/LAMBDA LIGHT CHAINS
Kappa free light chain: 4.9 mg/L (ref 3.3–19.4)
Kappa, lambda light chain ratio: 2.72 — ABNORMAL HIGH (ref 0.26–1.65)
Lambda free light chains: 1.8 mg/L — ABNORMAL LOW (ref 5.7–26.3)

## 2019-06-20 LAB — MULTIPLE MYELOMA PANEL, SERUM
Albumin SerPl Elph-Mcnc: 3.7 g/dL (ref 2.9–4.4)
Albumin/Glob SerPl: 1.6 (ref 0.7–1.7)
Alpha 1: 0.2 g/dL (ref 0.0–0.4)
Alpha2 Glob SerPl Elph-Mcnc: 0.8 g/dL (ref 0.4–1.0)
B-Globulin SerPl Elph-Mcnc: 1 g/dL (ref 0.7–1.3)
Gamma Glob SerPl Elph-Mcnc: 0.3 g/dL — ABNORMAL LOW (ref 0.4–1.8)
Globulin, Total: 2.4 g/dL (ref 2.2–3.9)
IgA: 7 mg/dL — ABNORMAL LOW (ref 87–352)
IgG (Immunoglobin G), Serum: 406 mg/dL — ABNORMAL LOW (ref 586–1602)
IgM (Immunoglobulin M), Srm: 6 mg/dL — ABNORMAL LOW (ref 26–217)
M Protein SerPl Elph-Mcnc: 0.2 g/dL — ABNORMAL HIGH
Total Protein ELP: 6.1 g/dL (ref 6.0–8.5)

## 2019-06-25 ENCOUNTER — Other Ambulatory Visit: Payer: Self-pay

## 2019-06-25 ENCOUNTER — Inpatient Hospital Stay: Payer: Medicare Other

## 2019-06-25 VITALS — BP 145/97 | HR 71 | Temp 98.7°F | Resp 17 | Wt 109.5 lb

## 2019-06-25 DIAGNOSIS — C9001 Multiple myeloma in remission: Secondary | ICD-10-CM

## 2019-06-25 DIAGNOSIS — Z7189 Other specified counseling: Secondary | ICD-10-CM

## 2019-06-25 DIAGNOSIS — Z9481 Bone marrow transplant status: Secondary | ICD-10-CM

## 2019-06-25 DIAGNOSIS — Z5112 Encounter for antineoplastic immunotherapy: Secondary | ICD-10-CM | POA: Diagnosis not present

## 2019-06-25 LAB — CMP (CANCER CENTER ONLY)
ALT: 14 U/L (ref 0–44)
AST: 16 U/L (ref 15–41)
Albumin: 4.1 g/dL (ref 3.5–5.0)
Alkaline Phosphatase: 67 U/L (ref 38–126)
Anion gap: 10 (ref 5–15)
BUN: 13 mg/dL (ref 8–23)
CO2: 25 mmol/L (ref 22–32)
Calcium: 8.9 mg/dL (ref 8.9–10.3)
Chloride: 104 mmol/L (ref 98–111)
Creatinine: 1 mg/dL (ref 0.44–1.00)
GFR, Est AFR Am: >60 mL/min (ref >60)
GFR, Estimated: 57 mL/min — ABNORMAL LOW (ref >60)
Glucose, Bld: 79 mg/dL (ref 70–99)
Potassium: 3.8 mmol/L (ref 3.5–5.1)
Sodium: 139 mmol/L (ref 135–145)
Total Bilirubin: 0.6 mg/dL (ref 0.3–1.2)
Total Protein: 6.6 g/dL (ref 6.5–8.1)

## 2019-06-25 LAB — CBC WITH DIFFERENTIAL (CANCER CENTER ONLY)
Abs Immature Granulocytes: 0.03 10*3/uL (ref 0.00–0.07)
Basophils Absolute: 0 10*3/uL (ref 0.0–0.1)
Basophils Relative: 0 %
Eosinophils Absolute: 0.1 10*3/uL (ref 0.0–0.5)
Eosinophils Relative: 2 %
HCT: 33 % — ABNORMAL LOW (ref 36.0–46.0)
Hemoglobin: 11.2 g/dL — ABNORMAL LOW (ref 12.0–15.0)
Immature Granulocytes: 1 %
Lymphocytes Relative: 29 %
Lymphs Abs: 1.3 10*3/uL (ref 0.7–4.0)
MCH: 30.4 pg (ref 26.0–34.0)
MCHC: 33.9 g/dL (ref 30.0–36.0)
MCV: 89.7 fL (ref 80.0–100.0)
Monocytes Absolute: 0.5 10*3/uL (ref 0.1–1.0)
Monocytes Relative: 10 %
Neutro Abs: 2.6 10*3/uL (ref 1.7–7.7)
Neutrophils Relative %: 58 %
Platelet Count: 190 10*3/uL (ref 150–400)
RBC: 3.68 MIL/uL — ABNORMAL LOW (ref 3.87–5.11)
RDW: 15.2 % (ref 11.5–15.5)
WBC Count: 4.5 10*3/uL (ref 4.0–10.5)
nRBC: 0 % (ref 0.0–0.2)

## 2019-06-25 MED ORDER — DIPHENHYDRAMINE HCL 25 MG PO CAPS
ORAL_CAPSULE | ORAL | Status: AC
  Filled 2019-06-25: qty 1

## 2019-06-25 MED ORDER — DEXAMETHASONE 4 MG PO TABS
12.0000 mg | ORAL_TABLET | Freq: Once | ORAL | Status: AC
  Administered 2019-06-25: 12 mg via ORAL

## 2019-06-25 MED ORDER — MONTELUKAST SODIUM 10 MG PO TABS
10.0000 mg | ORAL_TABLET | Freq: Once | ORAL | Status: AC
  Administered 2019-06-25: 10 mg via ORAL

## 2019-06-25 MED ORDER — PROCHLORPERAZINE MALEATE 10 MG PO TABS
ORAL_TABLET | ORAL | Status: AC
  Filled 2019-06-25: qty 1

## 2019-06-25 MED ORDER — DARATUMUMAB-HYALURONIDASE-FIHJ 1800-30000 MG-UT/15ML ~~LOC~~ SOLN
1800.0000 mg | Freq: Once | SUBCUTANEOUS | Status: AC
  Administered 2019-06-25: 1800 mg via SUBCUTANEOUS
  Filled 2019-06-25: qty 15

## 2019-06-25 MED ORDER — ACETAMINOPHEN 325 MG PO TABS
650.0000 mg | ORAL_TABLET | Freq: Once | ORAL | Status: AC
  Administered 2019-06-25: 650 mg via ORAL

## 2019-06-25 MED ORDER — ACETAMINOPHEN 325 MG PO TABS
ORAL_TABLET | ORAL | Status: AC
  Filled 2019-06-25: qty 2

## 2019-06-25 MED ORDER — PROCHLORPERAZINE MALEATE 10 MG PO TABS
10.0000 mg | ORAL_TABLET | Freq: Once | ORAL | Status: AC
  Administered 2019-06-25: 10 mg via ORAL

## 2019-06-25 MED ORDER — MONTELUKAST SODIUM 10 MG PO TABS
ORAL_TABLET | ORAL | Status: AC
  Filled 2019-06-25: qty 1

## 2019-06-25 MED ORDER — DIPHENHYDRAMINE HCL 25 MG PO CAPS
25.0000 mg | ORAL_CAPSULE | Freq: Once | ORAL | Status: AC
  Administered 2019-06-25: 25 mg via ORAL

## 2019-06-25 MED ORDER — BORTEZOMIB CHEMO SQ INJECTION 3.5 MG (2.5MG/ML)
1.3000 mg/m2 | Freq: Once | INTRAMUSCULAR | Status: AC
  Administered 2019-06-25: 1.75 mg via SUBCUTANEOUS
  Filled 2019-06-25: qty 0.7

## 2019-06-25 MED ORDER — DEXAMETHASONE 4 MG PO TABS
ORAL_TABLET | ORAL | Status: AC
  Filled 2019-06-25: qty 3

## 2019-06-25 NOTE — Progress Notes (Signed)
Dr. Alvy Bimler made aware of patient's elevated BP. Patient reports taking Hctz every morning and checking BP at home. She states BP has been elevated at home as well. Per Dr. Alvy Bimler: patient should double Hctz dose and f/u with PCP.   Instructions relayed to patient who verbalized understanding and agreement.  Also per Dr. Alvy Bimler: OK to D/C patient after Darzalex Faspro injection (no need to observe patient for 1 hour post injection)

## 2019-06-25 NOTE — Patient Instructions (Addendum)
**Per Dr. Alvy Bimler: take 2 hydrochlorothiazide tablets (25mg  total) daily and follow up with your primary care doctor to see what may be causing the elevated blood pressure**  [name] Discharge Instructions for Patients Receiving Chemotherapy  Today you received the following chemotherapy agents: Bortezomib (Velcade) and Daratumumab-hyaluronidase-fihj (Darzalex Faspro)  To help prevent nausea and vomiting after your treatment, we encourage you to take your nausea medication as directed.   If you develop nausea and vomiting that is not controlled by your nausea medication, call the clinic.   BELOW ARE SYMPTOMS THAT SHOULD BE REPORTED IMMEDIATELY:  *FEVER GREATER THAN 100.5 F  *CHILLS WITH OR WITHOUT FEVER  NAUSEA AND VOMITING THAT IS NOT CONTROLLED WITH YOUR NAUSEA MEDICATION  *UNUSUAL SHORTNESS OF BREATH  *UNUSUAL BRUISING OR BLEEDING  TENDERNESS IN MOUTH AND THROAT WITH OR WITHOUT PRESENCE OF ULCERS  *URINARY PROBLEMS  *BOWEL PROBLEMS  UNUSUAL RASH Items with * indicate a potential emergency and should be followed up as soon as possible.  Feel free to call the clinic should you have any questions or concerns. The clinic phone number is [phone].  Please show the [place] at check-in to the Emergency Department and triage nurse.  Coronavirus (COVID-19) Are you at risk?  Are you at risk for the Coronavirus (COVID-19)?  To be considered HIGH RISK for Coronavirus (COVID-19), you have to meet the following criteria:  . Traveled to [place], [place], [place], [place] or [place]; or in the [place] to [place], [place], [place], or [place]; and have fever, cough, and shortness of breath within the last 2 weeks of travel OR . Been in close contact with a person diagnosed with COVID-19 within the last 2 weeks and have fever, cough, and shortness of breath . IF YOU DO NOT MEET THESE CRITERIA, YOU ARE CONSIDERED LOW RISK FOR COVID-19.  What  to do if you are HIGH RISK for COVID-19?  Marland Kitchen If you are having a medical emergency, call 911. . Seek medical care right away. Before you go to a doctor's office, urgent care or emergency department, call ahead and tell them about your recent travel, contact with someone diagnosed with COVID-19, and your symptoms. You should receive instructions from your physician's office regarding next steps of care.  . When you arrive at healthcare provider, tell the healthcare staff immediately you have returned from visiting [place], [place], [place], [place] or [place]; or traveled in the [place] to [place], [place], [place], or [place]; in the last two weeks or you have been in close contact with a person diagnosed with COVID-19 in the last 2 weeks.   . Tell the health care staff about your symptoms: fever, cough and shortness of breath. . After you have been seen by a medical provider, you will be either: o Tested for (COVID-19) and discharged home on quarantine except to seek medical care if symptoms worsen, and asked to  - Stay home and avoid contact with others until you get your results (4-5 days)  - Avoid travel on public transportation if possible (such as bus, train, or airplane) or o Sent to the Emergency Department by EMS for evaluation, COVID-19 testing, and possible admission depending on your condition and test results.  What to do if you are LOW RISK for COVID-19?  Reduce your risk of any infection by using the same precautions used for avoiding the common cold or flu:  Marland Kitchen Wash your hands often with  soap and warm water for at least 20 seconds.  If soap and water are not readily available, use an alcohol-based hand sanitizer with at least 60% alcohol.  . If coughing or sneezing, cover your mouth and nose by coughing or sneezing into the elbow areas of your shirt or coat, into a tissue or into your sleeve (not your hands). . Avoid shaking hands with others and consider head nods or  verbal greetings only. . Avoid touching your eyes, nose, or mouth with unwashed hands.  . Avoid close contact with people who are sick. . Avoid places or events with large numbers of people in one location, like concerts or sporting events. . Carefully consider travel plans you have or are making. . If you are planning any travel outside or inside the [place], visit the CDC's Travelers' Health webpage for the latest health notices. . If you have some symptoms but not all symptoms, continue to monitor at home and seek medical attention if your symptoms worsen. . If you are having a medical emergency, call 911.   [place] / e-Visit: [email or web address]         [facility]: [facility]: [id number]                   [facility]: [phone]

## 2019-07-09 ENCOUNTER — Inpatient Hospital Stay: Payer: Medicare Other

## 2019-07-09 ENCOUNTER — Other Ambulatory Visit: Payer: Self-pay

## 2019-07-09 VITALS — BP 140/89 | HR 77 | Temp 98.0°F | Resp 18

## 2019-07-09 DIAGNOSIS — Z7189 Other specified counseling: Secondary | ICD-10-CM

## 2019-07-09 DIAGNOSIS — Z9481 Bone marrow transplant status: Secondary | ICD-10-CM

## 2019-07-09 DIAGNOSIS — C9001 Multiple myeloma in remission: Secondary | ICD-10-CM

## 2019-07-09 DIAGNOSIS — Z5112 Encounter for antineoplastic immunotherapy: Secondary | ICD-10-CM | POA: Diagnosis not present

## 2019-07-09 LAB — CBC WITH DIFFERENTIAL (CANCER CENTER ONLY)
Abs Immature Granulocytes: 0.01 10*3/uL (ref 0.00–0.07)
Basophils Absolute: 0 10*3/uL (ref 0.0–0.1)
Basophils Relative: 1 %
Eosinophils Absolute: 0.1 10*3/uL (ref 0.0–0.5)
Eosinophils Relative: 2 %
HCT: 31.5 % — ABNORMAL LOW (ref 36.0–46.0)
Hemoglobin: 10.8 g/dL — ABNORMAL LOW (ref 12.0–15.0)
Immature Granulocytes: 0 %
Lymphocytes Relative: 34 %
Lymphs Abs: 1.7 10*3/uL (ref 0.7–4.0)
MCH: 30.7 pg (ref 26.0–34.0)
MCHC: 34.3 g/dL (ref 30.0–36.0)
MCV: 89.5 fL (ref 80.0–100.0)
Monocytes Absolute: 0.6 10*3/uL (ref 0.1–1.0)
Monocytes Relative: 13 %
Neutro Abs: 2.5 10*3/uL (ref 1.7–7.7)
Neutrophils Relative %: 50 %
Platelet Count: 257 10*3/uL (ref 150–400)
RBC: 3.52 MIL/uL — ABNORMAL LOW (ref 3.87–5.11)
RDW: 14.5 % (ref 11.5–15.5)
WBC Count: 5 10*3/uL (ref 4.0–10.5)
nRBC: 0 % (ref 0.0–0.2)

## 2019-07-09 LAB — CMP (CANCER CENTER ONLY)
ALT: 14 U/L (ref 0–44)
AST: 19 U/L (ref 15–41)
Albumin: 4 g/dL (ref 3.5–5.0)
Alkaline Phosphatase: 70 U/L (ref 38–126)
Anion gap: 10 (ref 5–15)
BUN: 13 mg/dL (ref 8–23)
CO2: 23 mmol/L (ref 22–32)
Calcium: 8.9 mg/dL (ref 8.9–10.3)
Chloride: 100 mmol/L (ref 98–111)
Creatinine: 1.07 mg/dL — ABNORMAL HIGH (ref 0.44–1.00)
GFR, Est AFR Am: >60 mL/min (ref >60)
GFR, Estimated: 53 mL/min — ABNORMAL LOW (ref >60)
Glucose, Bld: 79 mg/dL (ref 70–99)
Potassium: 4.2 mmol/L (ref 3.5–5.1)
Sodium: 133 mmol/L — ABNORMAL LOW (ref 135–145)
Total Bilirubin: 0.5 mg/dL (ref 0.3–1.2)
Total Protein: 6.6 g/dL (ref 6.5–8.1)

## 2019-07-09 MED ORDER — DEXAMETHASONE 4 MG PO TABS
12.0000 mg | ORAL_TABLET | Freq: Once | ORAL | Status: AC
  Administered 2019-07-09: 12 mg via ORAL

## 2019-07-09 MED ORDER — ACETAMINOPHEN 325 MG PO TABS
650.0000 mg | ORAL_TABLET | Freq: Once | ORAL | Status: AC
  Administered 2019-07-09: 650 mg via ORAL

## 2019-07-09 MED ORDER — BORTEZOMIB CHEMO SQ INJECTION 3.5 MG (2.5MG/ML)
1.3000 mg/m2 | Freq: Once | INTRAMUSCULAR | Status: AC
  Administered 2019-07-09: 1.75 mg via SUBCUTANEOUS
  Filled 2019-07-09: qty 0.7

## 2019-07-09 MED ORDER — MONTELUKAST SODIUM 10 MG PO TABS
ORAL_TABLET | ORAL | Status: AC
  Filled 2019-07-09: qty 1

## 2019-07-09 MED ORDER — PROCHLORPERAZINE MALEATE 10 MG PO TABS
ORAL_TABLET | ORAL | Status: AC
  Filled 2019-07-09: qty 1

## 2019-07-09 MED ORDER — ACETAMINOPHEN 325 MG PO TABS
ORAL_TABLET | ORAL | Status: AC
  Filled 2019-07-09: qty 2

## 2019-07-09 MED ORDER — DIPHENHYDRAMINE HCL 25 MG PO CAPS
ORAL_CAPSULE | ORAL | Status: AC
  Filled 2019-07-09: qty 1

## 2019-07-09 MED ORDER — MONTELUKAST SODIUM 10 MG PO TABS
10.0000 mg | ORAL_TABLET | Freq: Once | ORAL | Status: AC
  Administered 2019-07-09: 10 mg via ORAL

## 2019-07-09 MED ORDER — PROCHLORPERAZINE MALEATE 10 MG PO TABS
10.0000 mg | ORAL_TABLET | Freq: Once | ORAL | Status: AC
  Administered 2019-07-09: 10 mg via ORAL

## 2019-07-09 MED ORDER — DARATUMUMAB-HYALURONIDASE-FIHJ 1800-30000 MG-UT/15ML ~~LOC~~ SOLN
1800.0000 mg | Freq: Once | SUBCUTANEOUS | Status: AC
  Administered 2019-07-09: 1800 mg via SUBCUTANEOUS
  Filled 2019-07-09: qty 15

## 2019-07-09 MED ORDER — DEXAMETHASONE 4 MG PO TABS
ORAL_TABLET | ORAL | Status: AC
  Filled 2019-07-09: qty 3

## 2019-07-09 MED ORDER — DIPHENHYDRAMINE HCL 25 MG PO CAPS
25.0000 mg | ORAL_CAPSULE | Freq: Once | ORAL | Status: AC
  Administered 2019-07-09: 25 mg via ORAL

## 2019-07-09 NOTE — Patient Instructions (Signed)
**Per Dr. Alvy Bimler: take 2 hydrochlorothiazide tablets (25mg  total) daily and follow up with your primary care doctor to see what may be causing the elevated blood pressure**  [name] Discharge Instructions for Patients Receiving Chemotherapy  Today you received the following chemotherapy agents: Bortezomib (Velcade) and Daratumumab-hyaluronidase-fihj (Darzalex Faspro)  To help prevent nausea and vomiting after your treatment, we encourage you to take your nausea medication as directed.   If you develop nausea and vomiting that is not controlled by your nausea medication, call the clinic.   BELOW ARE SYMPTOMS THAT SHOULD BE REPORTED IMMEDIATELY:  *FEVER GREATER THAN 100.5 F  *CHILLS WITH OR WITHOUT FEVER  NAUSEA AND VOMITING THAT IS NOT CONTROLLED WITH YOUR NAUSEA MEDICATION  *UNUSUAL SHORTNESS OF BREATH  *UNUSUAL BRUISING OR BLEEDING  TENDERNESS IN MOUTH AND THROAT WITH OR WITHOUT PRESENCE OF ULCERS  *URINARY PROBLEMS  *BOWEL PROBLEMS  UNUSUAL RASH Items with * indicate a potential emergency and should be followed up as soon as possible.  Feel free to call the clinic should you have any questions or concerns. The clinic phone number is [phone].  Please show the [place] at check-in to the Emergency Department and triage nurse.  Coronavirus (COVID-19) Are you at risk?  Are you at risk for the Coronavirus (COVID-19)?  To be considered HIGH RISK for Coronavirus (COVID-19), you have to meet the following criteria:  . Traveled to [place], [place], [place], [place] or [place]; or in the [place] to [place], [place], [place], or [place]; and have fever, cough, and shortness of breath within the last 2 weeks of travel OR . Been in close contact with a person diagnosed with COVID-19 within the last 2 weeks and have fever, cough, and shortness of breath . IF YOU DO NOT MEET THESE CRITERIA, YOU ARE CONSIDERED LOW RISK FOR COVID-19.  What  to do if you are HIGH RISK for COVID-19?  Marland Kitchen If you are having a medical emergency, call 911. . Seek medical care right away. Before you go to a doctor's office, urgent care or emergency department, call ahead and tell them about your recent travel, contact with someone diagnosed with COVID-19, and your symptoms. You should receive instructions from your physician's office regarding next steps of care.  . When you arrive at healthcare provider, tell the healthcare staff immediately you have returned from visiting [place], [place], [place], [place] or [place]; or traveled in the [place] to [place], [place], [place], or [place]; in the last two weeks or you have been in close contact with a person diagnosed with COVID-19 in the last 2 weeks.   . Tell the health care staff about your symptoms: fever, cough and shortness of breath. . After you have been seen by a medical provider, you will be either: o Tested for (COVID-19) and discharged home on quarantine except to seek medical care if symptoms worsen, and asked to  - Stay home and avoid contact with others until you get your results (4-5 days)  - Avoid travel on public transportation if possible (such as bus, train, or airplane) or o Sent to the Emergency Department by EMS for evaluation, COVID-19 testing, and possible admission depending on your condition and test results.  What to do if you are LOW RISK for COVID-19?  Reduce your risk of any infection by using the same precautions used for avoiding the common cold or flu:  Marland Kitchen Wash your hands often with  soap and warm water for at least 20 seconds.  If soap and water are not readily available, use an alcohol-based hand sanitizer with at least 60% alcohol.  . If coughing or sneezing, cover your mouth and nose by coughing or sneezing into the elbow areas of your shirt or coat, into a tissue or into your sleeve (not your hands). . Avoid shaking hands with others and consider head nods or  verbal greetings only. . Avoid touching your eyes, nose, or mouth with unwashed hands.  . Avoid close contact with people who are sick. . Avoid places or events with large numbers of people in one location, like concerts or sporting events. . Carefully consider travel plans you have or are making. . If you are planning any travel outside or inside the [place], visit the CDC's Travelers' Health webpage for the latest health notices. . If you have some symptoms but not all symptoms, continue to monitor at home and seek medical attention if your symptoms worsen. . If you are having a medical emergency, call 911.   [place] / e-Visit: [email or web address]         [facility]: [facility]: [phone]                   [facility]: [phone]

## 2019-07-23 ENCOUNTER — Inpatient Hospital Stay (HOSPITAL_BASED_OUTPATIENT_CLINIC_OR_DEPARTMENT_OTHER): Payer: Medicare Other | Admitting: Hematology and Oncology

## 2019-07-23 ENCOUNTER — Other Ambulatory Visit: Payer: Self-pay

## 2019-07-23 ENCOUNTER — Inpatient Hospital Stay: Payer: Medicare Other | Attending: Hematology and Oncology

## 2019-07-23 ENCOUNTER — Inpatient Hospital Stay: Payer: Medicare Other

## 2019-07-23 ENCOUNTER — Encounter: Payer: Self-pay | Admitting: Hematology and Oncology

## 2019-07-23 DIAGNOSIS — Z79899 Other long term (current) drug therapy: Secondary | ICD-10-CM | POA: Insufficient documentation

## 2019-07-23 DIAGNOSIS — C9001 Multiple myeloma in remission: Secondary | ICD-10-CM

## 2019-07-23 DIAGNOSIS — D638 Anemia in other chronic diseases classified elsewhere: Secondary | ICD-10-CM | POA: Diagnosis not present

## 2019-07-23 DIAGNOSIS — G620 Drug-induced polyneuropathy: Secondary | ICD-10-CM | POA: Diagnosis not present

## 2019-07-23 DIAGNOSIS — Z5112 Encounter for antineoplastic immunotherapy: Secondary | ICD-10-CM | POA: Diagnosis not present

## 2019-07-23 DIAGNOSIS — D6481 Anemia due to antineoplastic chemotherapy: Secondary | ICD-10-CM | POA: Insufficient documentation

## 2019-07-23 DIAGNOSIS — Z7189 Other specified counseling: Secondary | ICD-10-CM

## 2019-07-23 DIAGNOSIS — Z9481 Bone marrow transplant status: Secondary | ICD-10-CM

## 2019-07-23 DIAGNOSIS — Z923 Personal history of irradiation: Secondary | ICD-10-CM | POA: Insufficient documentation

## 2019-07-23 DIAGNOSIS — T451X5A Adverse effect of antineoplastic and immunosuppressive drugs, initial encounter: Secondary | ICD-10-CM

## 2019-07-23 DIAGNOSIS — Z9221 Personal history of antineoplastic chemotherapy: Secondary | ICD-10-CM | POA: Insufficient documentation

## 2019-07-23 LAB — CMP (CANCER CENTER ONLY)
ALT: 10 U/L (ref 0–44)
AST: 17 U/L (ref 15–41)
Albumin: 4 g/dL (ref 3.5–5.0)
Alkaline Phosphatase: 60 U/L (ref 38–126)
Anion gap: 10 (ref 5–15)
BUN: 11 mg/dL (ref 8–23)
CO2: 25 mmol/L (ref 22–32)
Calcium: 8.7 mg/dL — ABNORMAL LOW (ref 8.9–10.3)
Chloride: 105 mmol/L (ref 98–111)
Creatinine: 0.98 mg/dL (ref 0.44–1.00)
GFR, Est AFR Am: >60 mL/min (ref >60)
GFR, Estimated: 59 mL/min — ABNORMAL LOW (ref >60)
Glucose, Bld: 80 mg/dL (ref 70–99)
Potassium: 3.7 mmol/L (ref 3.5–5.1)
Sodium: 140 mmol/L (ref 135–145)
Total Bilirubin: 0.5 mg/dL (ref 0.3–1.2)
Total Protein: 6.5 g/dL (ref 6.5–8.1)

## 2019-07-23 LAB — CBC WITH DIFFERENTIAL (CANCER CENTER ONLY)
Abs Immature Granulocytes: 0.01 10*3/uL (ref 0.00–0.07)
Basophils Absolute: 0 10*3/uL (ref 0.0–0.1)
Basophils Relative: 1 %
Eosinophils Absolute: 0.1 10*3/uL (ref 0.0–0.5)
Eosinophils Relative: 1 %
HCT: 31.6 % — ABNORMAL LOW (ref 36.0–46.0)
Hemoglobin: 10.4 g/dL — ABNORMAL LOW (ref 12.0–15.0)
Immature Granulocytes: 0 %
Lymphocytes Relative: 28 %
Lymphs Abs: 1.6 10*3/uL (ref 0.7–4.0)
MCH: 30.1 pg (ref 26.0–34.0)
MCHC: 32.9 g/dL (ref 30.0–36.0)
MCV: 91.6 fL (ref 80.0–100.0)
Monocytes Absolute: 0.5 10*3/uL (ref 0.1–1.0)
Monocytes Relative: 8 %
Neutro Abs: 3.5 10*3/uL (ref 1.7–7.7)
Neutrophils Relative %: 62 %
Platelet Count: 246 10*3/uL (ref 150–400)
RBC: 3.45 MIL/uL — ABNORMAL LOW (ref 3.87–5.11)
RDW: 14.4 % (ref 11.5–15.5)
WBC Count: 5.7 10*3/uL (ref 4.0–10.5)
nRBC: 0 % (ref 0.0–0.2)

## 2019-07-23 MED ORDER — ACETAMINOPHEN 325 MG PO TABS
650.0000 mg | ORAL_TABLET | Freq: Once | ORAL | Status: AC
  Administered 2019-07-23: 650 mg via ORAL

## 2019-07-23 MED ORDER — ACETAMINOPHEN 325 MG PO TABS
ORAL_TABLET | ORAL | Status: AC
  Filled 2019-07-23: qty 2

## 2019-07-23 MED ORDER — MONTELUKAST SODIUM 10 MG PO TABS
10.0000 mg | ORAL_TABLET | Freq: Once | ORAL | Status: AC
  Administered 2019-07-23: 10 mg via ORAL

## 2019-07-23 MED ORDER — DEXAMETHASONE 4 MG PO TABS
10.0000 mg | ORAL_TABLET | Freq: Once | ORAL | Status: AC
  Administered 2019-07-23: 10 mg via ORAL

## 2019-07-23 MED ORDER — MONTELUKAST SODIUM 10 MG PO TABS
ORAL_TABLET | ORAL | Status: AC
  Filled 2019-07-23: qty 1

## 2019-07-23 MED ORDER — DIPHENHYDRAMINE HCL 25 MG PO CAPS
ORAL_CAPSULE | ORAL | Status: AC
  Filled 2019-07-23: qty 1

## 2019-07-23 MED ORDER — DIPHENHYDRAMINE HCL 25 MG PO CAPS
25.0000 mg | ORAL_CAPSULE | Freq: Once | ORAL | Status: AC
  Administered 2019-07-23: 25 mg via ORAL

## 2019-07-23 MED ORDER — DEXAMETHASONE 4 MG PO TABS
ORAL_TABLET | ORAL | Status: AC
  Filled 2019-07-23: qty 3

## 2019-07-23 MED ORDER — DARATUMUMAB-HYALURONIDASE-FIHJ 1800-30000 MG-UT/15ML ~~LOC~~ SOLN
1800.0000 mg | Freq: Once | SUBCUTANEOUS | Status: AC
  Administered 2019-07-23: 1800 mg via SUBCUTANEOUS
  Filled 2019-07-23: qty 15

## 2019-07-23 MED ORDER — PROCHLORPERAZINE MALEATE 10 MG PO TABS
ORAL_TABLET | ORAL | Status: AC
  Filled 2019-07-23: qty 1

## 2019-07-23 NOTE — Progress Notes (Signed)
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  Observe  Neuropathy due to chemotherapeutic drug She is developing mild worsening neuropathy due to Velcade I recommend discontinuation of Velcade and allow recovery of neuropathy and she agreed   No orders of the defined types were placed in this encounter.   INTERVAL HISTORY: Please see below for problem oriented charting. She returns for further follow-up She complained of mild worsening neuropathy since last time I saw her Denies painful neuropathy but more like numb and tingling on the persistent basis No recent infection, fever or chills Denies infusion reaction  SUMMARY OF ONCOLOGIC HISTORY: Oncology History Overview Note   M-protein 0.69 gm/dl IFIX - IgG, Kappa IgG - 868 IgA - 19 IgM - < 20 Kappa - 21 Lambda - 5.7  09/06/2014 - Bone marrow aspirate and biopsy:   Normocellular marrow for age (40%) with a small monoclonal plasma cell population (1% on aspirate). Karyotype 14, XX  FISH Negative for myeloma associated changes  09/12/2014 - PET/CT  Two regions that are concerning for disease, one adjacent/involving the left ninth rib and one in the marrow of the right femur, in this patient with history of plasmacytoma.    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   04/01/2015 Bone Marrow Transplant   She received melphalan chemotherapy on 03/31/2015 followed by autologous stem cell transplant the day after   04/03/2015 - 04/18/2015 Hospital Admission   The patient was admitted to the hospital at [place] for management related to complication from stem cell transplant. She had significant nausea requiring intravenous anti-emetics.   07/17/2015 -  Chemotherapy   She started maintenance Revlimid and monthly zometa, then every 3 months   06/01/2018 Imaging   DEXA scan showed bone density T score in femur -2.3   04/20/2019 PET  scan   1. No FDG avid osseous lesions or mass identified to suggest metabolically active lesion of myeloma or plasmacytoma. 2. Small nodular density within the paravertebral right lower lobe exhibits mild to moderate increased uptake within SUV max of 3.38. This is indeterminate. Review of CT chest from 10/05/2018 shows a corresponding Lung nodule in this area  measuring the same. Small pulmonary neoplasm cannot be excluded. 3. Indeterminate, focal area of increased uptake is identified within the thoracic canal. Indeterminate favored to represent benign physiologic CNS activity.     04/30/2019 -  Chemotherapy   The patient had daratumumab-hyaluronidase-fihj (DARZALEX FASPRO) 1800-30000 MG-UT/15ML chemo SQ injection 1,800 mg, 1,800 mg, Subcutaneous,  Once, 4 of 9 cycles Administration: 1,800 mg (04/30/2019), 1,800 mg (05/07/2019), 1,800 mg (05/14/2019), 1,800 mg (05/21/2019), 1,800 mg (05/28/2019), 1,800 mg (06/04/2019), 1,800 mg (06/11/2019), 1,800 mg (06/18/2019), 1,800 mg (06/25/2019), 1,800 mg (07/09/2019), 1,800 mg (07/23/2019)  for chemotherapy treatment.    04/30/2019 - 07/22/2019 Chemotherapy   The patient had bortezomib SQ (VELCADE) chemo injection 1.75 mg, 1.3 mg/m2 = 1.75 mg, Subcutaneous,  Once, 10 of 11 cycles Administration: 1.75 mg (04/30/2019), 1.75 mg (05/07/2019), 1.75 mg (05/14/2019), 1.75 mg (05/21/2019), 1.75 mg (05/28/2019), 1.75 mg (06/04/2019), 1.75 mg (06/11/2019), 1.75 mg (06/18/2019), 1.75 mg (06/25/2019), 1.75 mg (07/09/2019)  for chemotherapy treatment.      REVIEW OF SYSTEMS:   Constitutional: Denies fevers, chills or abnormal weight loss Eyes: Denies blurriness of vision Ears, nose, mouth, throat, and face: Denies mucositis or sore throat Respiratory: Denies cough, dyspnea or wheezes Cardiovascular: Denies palpitation, chest discomfort or lower extremity swelling Gastrointestinal:  Denies nausea, heartburn or change in bowel habits Skin: Denies abnormal skin rashes Lymphatics: Denies new lymphadenopathy or easy bruising Neurological:Denies numbness, tingling or new weaknesses Behavioral/Psych: Mood is stable, no new changes  All other systems were reviewed with the patient and are negative.  I have reviewed the past medical history, past surgical history, social history and family history with the patient and they are  unchanged from previous note.  ALLERGIES:  is allergic to augmentin [amoxicillin-pot clavulanate]; albuterol; codeine; ibuprofen; nsaids; and tolmetin.  MEDICATIONS:  Current Outpatient Medications  Medication Sig Dispense Refill  . acetaminophen (TYLENOL) 500 MG tablet Take 500-1,000 mg by mouth every 6 (six) hours as needed for mild pain, moderate pain, fever or headache.    Marland Kitchen acyclovir (ZOVIRAX) 400 MG tablet Take 1 tablet (400 mg total) by mouth 2 (two) times daily. (Patient not taking: Reported on 07/23/2019) 180 tablet 3  . aspirin EC 81 MG tablet Take 81 mg by mouth daily with breakfast.    . calcium carbonate (OSCAL) 1500 (600 Ca) MG TABS tablet Take 1,500 mg by mouth daily with breakfast.    . Calcium Carbonate 500 MG CHEW Chew 500 mg by mouth as needed for indigestion.    . Cholecalciferol (VITAMIN D3) 2000 units capsule Take 2,000 Units by mouth daily.     . hydrochlorothiazide (HYDRODIURIL) 12.5 MG tablet Take 1 tablet (12.5 mg total) by mouth daily. 30 tablet 11  . HYDROcodone-acetaminophen (NORCO/VICODIN) 5-325 MG tablet Take 1 tablet by mouth every 6 (six) hours as needed for moderate pain. (Patient not taking: Reported on 07/23/2019) 10 tablet 0  . Multiple Vitamins-Minerals (CENTRUM SILVER PO) Take 1 tablet by mouth daily.     Marland Kitchen omeprazole (PRILOSEC) 20 MG capsule Take 1 capsule (20 mg total) by mouth daily. 90 capsule 11  . ondansetron (ZOFRAN ODT) 4 MG disintegrating tablet Take 1 tablet (  4 mg total) by mouth every 8 (eight) hours as needed for nausea or vomiting. 12 tablet 1  . ondansetron (ZOFRAN) 8 MG tablet Take 1 tablet (8 mg total) by mouth every 8 (eight) hours as needed for nausea or vomiting. 60 tablet 1  . senna-docusate (SENOKOT-S) 8.6-50 MG tablet Take 1-2 tablets by mouth daily as needed for mild constipation or moderate constipation.     . sodium chloride (OCEAN) 0.65 % SOLN nasal spray Place 1 spray into both nostrils as needed for congestion.     No current  facility-administered medications for this visit.   Facility-Administered Medications Ordered in Other Visits  Medication Dose Route Frequency Provider Last Rate Last Admin  . 0.9 %  sodium chloride infusion   Intravenous Once Alvy Bimler, Bradey Luzier, MD        PHYSICAL EXAMINATION: ECOG PERFORMANCE STATUS: 1 - Symptomatic but completely ambulatory  Vitals:   07/23/19 0948  BP: (!) 142/99  Pulse: 74  Resp: 18  Temp: 97.8 F (36.6 C)  SpO2: 100%   Filed Weights   07/23/19 0948  Weight: 112 lb 11.2 oz (51.1 kg)    GENERAL:alert, no distress and comfortable SKIN: skin color, texture, turgor are normal, no rashes or significant lesions EYES: normal, Conjunctiva are pink and non-injected, sclera clear OROPHARYNX:no exudate, no erythema and lips, buccal mucosa, and tongue normal  NECK: supple, thyroid normal size, non-tender, without nodularity LYMPH:  no palpable lymphadenopathy in the cervical, axillary or inguinal LUNGS: clear to auscultation and percussion with normal breathing effort HEART: regular rate & rhythm and no murmurs and no lower extremity edema ABDOMEN:abdomen soft, non-tender and normal bowel sounds Musculoskeletal:no cyanosis of digits and no clubbing  NEURO: alert & oriented x 3 with fluent speech, no focal motor/sensory deficits  LABORATORY DATA:  I have reviewed the data as listed    Component Value Date/Time   NA 140 07/23/2019 0934   NA 143 07/14/2017 1245   K 3.7 07/23/2019 0934   K 3.3 (L) 07/14/2017 1245   CL 105 07/23/2019 0934   CO2 25 07/23/2019 0934   CO2 20 (L) 07/14/2017 1245   GLUCOSE 80 07/23/2019 0934   GLUCOSE 106 07/14/2017 1245   BUN 11 07/23/2019 0934   BUN 9.3 07/14/2017 1245   CREATININE 0.98 07/23/2019 0934   CREATININE 0.8 07/14/2017 1245   CALCIUM 8.7 (L) 07/23/2019 0934   CALCIUM 8.4 07/14/2017 1245   PROT 6.5 07/23/2019 0934   PROT 6.0 07/14/2017 1245   PROT 6.2 (L) 07/14/2017 1245   ALBUMIN 4.0 07/23/2019 0934   ALBUMIN 3.6  07/14/2017 1245   AST 17 07/23/2019 0934   AST 21 07/14/2017 1245   ALT 10 07/23/2019 0934   ALT 21 07/14/2017 1245   ALKPHOS 60 07/23/2019 0934   ALKPHOS 78 07/14/2017 1245   BILITOT 0.5 07/23/2019 0934   BILITOT 0.43 07/14/2017 1245   GFRNONAA 59 (L) 07/23/2019 0934   GFRAA >60 07/23/2019 0934    No results found for: SPEP, UPEP  Lab Results  Component Value Date   WBC 5.7 07/23/2019   NEUTROABS 3.5 07/23/2019   HGB 10.4 (L) 07/23/2019   HCT 31.6 (L) 07/23/2019   MCV 91.6 07/23/2019   PLT 246 07/23/2019      Chemistry      Component Value Date/Time   NA 140 07/23/2019 0934   NA 143 07/14/2017 1245   K 3.7 07/23/2019 0934   K 3.3 (L) 07/14/2017 1245   CL 105 07/23/2019  0934   CO2 25 07/23/2019 0934   CO2 20 (L) 07/14/2017 1245   BUN 11 07/23/2019 0934   BUN 9.3 07/14/2017 1245   CREATININE 0.98 07/23/2019 0934   CREATININE 0.8 07/14/2017 1245      Component Value Date/Time   CALCIUM 8.7 (L) 07/23/2019 0934   CALCIUM 8.4 07/14/2017 1245   ALKPHOS 60 07/23/2019 0934   ALKPHOS 78 07/14/2017 1245   AST 17 07/23/2019 0934   AST 21 07/14/2017 1245   ALT 10 07/23/2019 0934   ALT 21 07/14/2017 1245   BILITOT 0.5 07/23/2019 0934   BILITOT 0.43 07/14/2017 1245      All questions were answered. The patient knows to call the clinic with any problems, questions or concerns. No barriers to learning was detected.  I spent 15 minutes counseling the patient face to face. The total time spent in the appointment was 20 minutes and more than 50% was on counseling and review of test results  [name], MD 07/23/2019 12:43 PM

## 2019-07-23 NOTE — Assessment & Plan Note (Signed)
I have reviewed recent myeloma panel with the patient She have achieved maximum response to therapy She is developing neuropathy I recommend discontinuation of Velcade and continue on Faspro only We will continue myeloma panel once a month She agreed with the plan of care

## 2019-07-23 NOTE — Assessment & Plan Note (Signed)
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  Observe  

## 2019-07-23 NOTE — Patient Instructions (Addendum)
[facility] Discharge Instructions for Patients Receiving Chemotherapy  Today you received the following chemotherapy agents:  Daratumumab-hyaluronidase-fihj (Darzalex Faspro)  To help prevent nausea and vomiting after your treatment, we encourage you to take your nausea medication as directed.   If you develop nausea and vomiting that is not controlled by your nausea medication, call the clinic.   BELOW ARE SYMPTOMS THAT SHOULD BE REPORTED IMMEDIATELY:  *FEVER GREATER THAN 100.5 F  *CHILLS WITH OR WITHOUT FEVER  NAUSEA AND VOMITING THAT IS NOT CONTROLLED WITH YOUR NAUSEA MEDICATION  *UNUSUAL SHORTNESS OF BREATH  *UNUSUAL BRUISING OR BLEEDING  TENDERNESS IN MOUTH AND THROAT WITH OR WITHOUT PRESENCE OF ULCERS  *URINARY PROBLEMS  *BOWEL PROBLEMS  UNUSUAL RASH Items with * indicate a potential emergency and should be followed up as soon as possible.  Feel free to call the clinic should you have any questions or concerns. The clinic phone number is [phone].  Please show the [facility] at check-in to the Emergency Department and triage nurse.  Coronavirus (COVID-19) Are you at risk?  Are you at risk for the Coronavirus (COVID-19)?  To be considered HIGH RISK for Coronavirus (COVID-19), you have to meet the following criteria:  . Traveled to [place], [place], [place], [place] or [place]; or in the [place] to [place], [place], [place], or [place]; and have fever, cough, and shortness of breath within the last 2 weeks of travel OR . Been in close contact with a person diagnosed with COVID-19 within the last 2 weeks and have fever, cough, and shortness of breath . IF YOU DO NOT MEET THESE CRITERIA, YOU ARE CONSIDERED LOW RISK FOR COVID-19.  What to do if you are HIGH RISK for COVID-19?  Marland Kitchen If you are having a medical emergency, call 911. . Seek medical care right away. Before you go to a doctor's office, urgent care or emergency  department, call ahead and tell them about your recent travel, contact with someone diagnosed with COVID-19, and your symptoms. You should receive instructions from your physician's office regarding next steps of care.  . When you arrive at healthcare provider, tell the healthcare staff immediately you have returned from visiting [place], [place], [place], [place] or [place]; or traveled in the [place] to [place], [place], [place], or [place]; in the last two weeks or you have been in close contact with a person diagnosed with COVID-19 in the last 2 weeks.   . Tell the health care staff about your symptoms: fever, cough and shortness of breath. . After you have been seen by a medical provider, you will be either: o Tested for (COVID-19) and discharged home on quarantine except to seek medical care if symptoms worsen, and asked to  - Stay home and avoid contact with others until you get your results (4-5 days)  - Avoid travel on public transportation if possible (such as bus, train, or airplane) or o Sent to the Emergency Department by EMS for evaluation, COVID-19 testing, and possible admission depending on your condition and test results.  What to do if you are LOW RISK for COVID-19?  Reduce your risk of any infection by using the same precautions used for avoiding the common cold or flu:  Marland Kitchen Wash your hands often with soap and warm water for at least 20 seconds.  If soap and water are not readily available, use an alcohol-based hand sanitizer with at least 60% alcohol.  Marland Kitchen  If coughing or sneezing, cover your mouth and nose by coughing or sneezing into the elbow areas of your shirt or coat, into a tissue or into your sleeve (not your hands). . Avoid shaking hands with others and consider head nods or verbal greetings only. . Avoid touching your eyes, nose, or mouth with unwashed hands.  . Avoid close contact with people who are sick. . Avoid places or events with large numbers of people  in one location, like concerts or sporting events. . Carefully consider travel plans you have or are making. . If you are planning any travel outside or inside the [place], visit the CDC's Travelers' Health webpage for the latest health notices. . If you have some symptoms but not all symptoms, continue to monitor at home and seek medical attention if your symptoms worsen. . If you are having a medical emergency, call 911.   ADDITIONAL HEALTHCARE OPTIONS FOR PATIENTS  [facility] / e-Visit: [email or web address]         [facility]: [phone]  [facility]: [phone]                   [facility]: [phone]   

## 2019-07-23 NOTE — Assessment & Plan Note (Signed)
She is developing mild worsening neuropathy due to Velcade I recommend discontinuation of Velcade and allow recovery of neuropathy and she agreed

## 2019-07-24 LAB — KAPPA/LAMBDA LIGHT CHAINS
Kappa free light chain: 4.4 mg/L (ref 3.3–19.4)
Kappa, lambda light chain ratio: 2.44 — ABNORMAL HIGH (ref 0.26–1.65)
Lambda free light chains: 1.8 mg/L — ABNORMAL LOW (ref 5.7–26.3)

## 2019-07-25 ENCOUNTER — Telehealth: Payer: Self-pay | Admitting: Hematology and Oncology

## 2019-07-25 NOTE — Telephone Encounter (Signed)
Scheduled appt per 12/15 sch message - pt is aware of appts date and time

## 2019-07-26 LAB — MULTIPLE MYELOMA PANEL, SERUM
Albumin SerPl Elph-Mcnc: 3.9 g/dL (ref 2.9–4.4)
Albumin/Glob SerPl: 1.8 — ABNORMAL HIGH (ref 0.7–1.7)
Alpha 1: 0.2 g/dL (ref 0.0–0.4)
Alpha2 Glob SerPl Elph-Mcnc: 0.7 g/dL (ref 0.4–1.0)
B-Globulin SerPl Elph-Mcnc: 0.9 g/dL (ref 0.7–1.3)
Gamma Glob SerPl Elph-Mcnc: 0.3 g/dL — ABNORMAL LOW (ref 0.4–1.8)
Globulin, Total: 2.2 g/dL (ref 2.2–3.9)
IgA: 5 mg/dL — ABNORMAL LOW (ref 87–352)
IgG (Immunoglobin G), Serum: 380 mg/dL — ABNORMAL LOW (ref 586–1602)
IgM (Immunoglobulin M), Srm: <5 mg/dL — ABNORMAL LOW (ref 26–217)
M Protein SerPl Elph-Mcnc: 0.2 g/dL — ABNORMAL HIGH
Total Protein ELP: 6.1 g/dL (ref 6.0–8.5)

## 2019-08-06 ENCOUNTER — Inpatient Hospital Stay: Payer: Medicare Other

## 2019-08-06 ENCOUNTER — Other Ambulatory Visit: Payer: Self-pay

## 2019-08-06 ENCOUNTER — Other Ambulatory Visit: Payer: Medicare Other

## 2019-08-06 ENCOUNTER — Ambulatory Visit: Payer: Medicare Other

## 2019-08-06 VITALS — BP 143/86 | HR 74 | Temp 98.3°F | Resp 17

## 2019-08-06 DIAGNOSIS — Z5112 Encounter for antineoplastic immunotherapy: Secondary | ICD-10-CM | POA: Diagnosis not present

## 2019-08-06 DIAGNOSIS — Z9481 Bone marrow transplant status: Secondary | ICD-10-CM

## 2019-08-06 DIAGNOSIS — C9001 Multiple myeloma in remission: Secondary | ICD-10-CM

## 2019-08-06 DIAGNOSIS — Z7189 Other specified counseling: Secondary | ICD-10-CM

## 2019-08-06 LAB — CBC WITH DIFFERENTIAL (CANCER CENTER ONLY)
Abs Immature Granulocytes: 0.02 10*3/uL (ref 0.00–0.07)
Basophils Absolute: 0 10*3/uL (ref 0.0–0.1)
Basophils Relative: 1 %
Eosinophils Absolute: 0.1 10*3/uL (ref 0.0–0.5)
Eosinophils Relative: 1 %
HCT: 32.6 % — ABNORMAL LOW (ref 36.0–46.0)
Hemoglobin: 10.9 g/dL — ABNORMAL LOW (ref 12.0–15.0)
Immature Granulocytes: 0 %
Lymphocytes Relative: 33 %
Lymphs Abs: 2 10*3/uL (ref 0.7–4.0)
MCH: 30.8 pg (ref 26.0–34.0)
MCHC: 33.4 g/dL (ref 30.0–36.0)
MCV: 92.1 fL (ref 80.0–100.0)
Monocytes Absolute: 0.5 10*3/uL (ref 0.1–1.0)
Monocytes Relative: 9 %
Neutro Abs: 3.2 10*3/uL (ref 1.7–7.7)
Neutrophils Relative %: 56 %
Platelet Count: 261 10*3/uL (ref 150–400)
RBC: 3.54 MIL/uL — ABNORMAL LOW (ref 3.87–5.11)
RDW: 13.6 % (ref 11.5–15.5)
WBC Count: 5.9 10*3/uL (ref 4.0–10.5)
nRBC: 0 % (ref 0.0–0.2)

## 2019-08-06 LAB — CMP (CANCER CENTER ONLY)
ALT: 10 U/L (ref 0–44)
AST: 14 U/L — ABNORMAL LOW (ref 15–41)
Albumin: 3.9 g/dL (ref 3.5–5.0)
Alkaline Phosphatase: 69 U/L (ref 38–126)
Anion gap: 7 (ref 5–15)
BUN: 14 mg/dL (ref 8–23)
CO2: 24 mmol/L (ref 22–32)
Calcium: 8.2 mg/dL — ABNORMAL LOW (ref 8.9–10.3)
Chloride: 108 mmol/L (ref 98–111)
Creatinine: 1 mg/dL (ref 0.44–1.00)
GFR, Est AFR Am: >60 mL/min (ref >60)
GFR, Estimated: 57 mL/min — ABNORMAL LOW (ref >60)
Glucose, Bld: 87 mg/dL (ref 70–99)
Potassium: 4.2 mmol/L (ref 3.5–5.1)
Sodium: 139 mmol/L (ref 135–145)
Total Bilirubin: 0.3 mg/dL (ref 0.3–1.2)
Total Protein: 6.4 g/dL — ABNORMAL LOW (ref 6.5–8.1)

## 2019-08-06 MED ORDER — DEXAMETHASONE 4 MG PO TABS
ORAL_TABLET | ORAL | Status: AC
  Filled 2019-08-06: qty 3

## 2019-08-06 MED ORDER — DARATUMUMAB-HYALURONIDASE-FIHJ 1800-30000 MG-UT/15ML ~~LOC~~ SOLN
1800.0000 mg | Freq: Once | SUBCUTANEOUS | Status: AC
  Administered 2019-08-06: 1800 mg via SUBCUTANEOUS
  Filled 2019-08-06: qty 15

## 2019-08-06 MED ORDER — DIPHENHYDRAMINE HCL 25 MG PO CAPS
25.0000 mg | ORAL_CAPSULE | Freq: Once | ORAL | Status: AC
  Administered 2019-08-06: 25 mg via ORAL

## 2019-08-06 MED ORDER — ACETAMINOPHEN 325 MG PO TABS
ORAL_TABLET | ORAL | Status: AC
  Filled 2019-08-06: qty 2

## 2019-08-06 MED ORDER — DIPHENHYDRAMINE HCL 25 MG PO CAPS
ORAL_CAPSULE | ORAL | Status: AC
  Filled 2019-08-06: qty 25

## 2019-08-06 MED ORDER — MONTELUKAST SODIUM 10 MG PO TABS
10.0000 mg | ORAL_TABLET | Freq: Once | ORAL | Status: AC
  Administered 2019-08-06: 10 mg via ORAL

## 2019-08-06 MED ORDER — MONTELUKAST SODIUM 10 MG PO TABS
ORAL_TABLET | ORAL | Status: AC
  Filled 2019-08-06: qty 1

## 2019-08-06 MED ORDER — ACETAMINOPHEN 325 MG PO TABS
650.0000 mg | ORAL_TABLET | Freq: Once | ORAL | Status: AC
  Administered 2019-08-06: 650 mg via ORAL

## 2019-08-06 MED ORDER — DEXAMETHASONE 4 MG PO TABS
10.0000 mg | ORAL_TABLET | Freq: Once | ORAL | Status: AC
  Administered 2019-08-06: 10 mg via ORAL

## 2019-08-06 NOTE — Patient Instructions (Signed)
[facility] Discharge Instructions for Patients Receiving Chemotherapy  Today you received the following chemotherapy agents: Faspro   To help prevent nausea and vomiting after your treatment, we encourage you to take your nausea medication as directed.   If you develop nausea and vomiting that is not controlled by your nausea medication, call the clinic.   BELOW ARE SYMPTOMS THAT SHOULD BE REPORTED IMMEDIATELY:  *FEVER GREATER THAN 100.5 F  *CHILLS WITH OR WITHOUT FEVER  NAUSEA AND VOMITING THAT IS NOT CONTROLLED WITH YOUR NAUSEA MEDICATION  *UNUSUAL SHORTNESS OF BREATH  *UNUSUAL BRUISING OR BLEEDING  TENDERNESS IN MOUTH AND THROAT WITH OR WITHOUT PRESENCE OF ULCERS  *URINARY PROBLEMS  *BOWEL PROBLEMS  UNUSUAL RASH Items with * indicate a potential emergency and should be followed up as soon as possible.  Feel free to call the clinic should you have any questions or concerns. The clinic phone number is [phone].  Please show the [facility] at check-in to the Emergency Department and triage nurse.

## 2019-08-20 ENCOUNTER — Other Ambulatory Visit: Payer: Self-pay

## 2019-08-20 ENCOUNTER — Inpatient Hospital Stay: Payer: Medicare Other | Attending: Hematology and Oncology

## 2019-08-20 ENCOUNTER — Inpatient Hospital Stay (HOSPITAL_BASED_OUTPATIENT_CLINIC_OR_DEPARTMENT_OTHER): Payer: Medicare Other | Admitting: Hematology and Oncology

## 2019-08-20 ENCOUNTER — Inpatient Hospital Stay: Payer: Medicare Other

## 2019-08-20 DIAGNOSIS — L659 Nonscarring hair loss, unspecified: Secondary | ICD-10-CM | POA: Diagnosis not present

## 2019-08-20 DIAGNOSIS — Z886 Allergy status to analgesic agent status: Secondary | ICD-10-CM | POA: Insufficient documentation

## 2019-08-20 DIAGNOSIS — D6481 Anemia due to antineoplastic chemotherapy: Secondary | ICD-10-CM | POA: Diagnosis not present

## 2019-08-20 DIAGNOSIS — Z885 Allergy status to narcotic agent status: Secondary | ICD-10-CM | POA: Diagnosis not present

## 2019-08-20 DIAGNOSIS — G620 Drug-induced polyneuropathy: Secondary | ICD-10-CM | POA: Insufficient documentation

## 2019-08-20 DIAGNOSIS — Z880 Allergy status to penicillin: Secondary | ICD-10-CM | POA: Diagnosis not present

## 2019-08-20 DIAGNOSIS — T451X5A Adverse effect of antineoplastic and immunosuppressive drugs, initial encounter: Secondary | ICD-10-CM | POA: Diagnosis not present

## 2019-08-20 DIAGNOSIS — Z5112 Encounter for antineoplastic immunotherapy: Secondary | ICD-10-CM | POA: Insufficient documentation

## 2019-08-20 DIAGNOSIS — C9001 Multiple myeloma in remission: Secondary | ICD-10-CM | POA: Diagnosis present

## 2019-08-20 DIAGNOSIS — R911 Solitary pulmonary nodule: Secondary | ICD-10-CM | POA: Insufficient documentation

## 2019-08-20 DIAGNOSIS — G629 Polyneuropathy, unspecified: Secondary | ICD-10-CM | POA: Insufficient documentation

## 2019-08-20 DIAGNOSIS — D638 Anemia in other chronic diseases classified elsewhere: Secondary | ICD-10-CM

## 2019-08-20 DIAGNOSIS — Z923 Personal history of irradiation: Secondary | ICD-10-CM | POA: Diagnosis not present

## 2019-08-20 DIAGNOSIS — Z79899 Other long term (current) drug therapy: Secondary | ICD-10-CM | POA: Diagnosis not present

## 2019-08-20 DIAGNOSIS — Z881 Allergy status to other antibiotic agents status: Secondary | ICD-10-CM | POA: Diagnosis not present

## 2019-08-20 DIAGNOSIS — Z7189 Other specified counseling: Secondary | ICD-10-CM

## 2019-08-20 DIAGNOSIS — Z9484 Stem cells transplant status: Secondary | ICD-10-CM | POA: Insufficient documentation

## 2019-08-20 DIAGNOSIS — Z9481 Bone marrow transplant status: Secondary | ICD-10-CM

## 2019-08-20 LAB — CMP (CANCER CENTER ONLY)
ALT: 10 U/L (ref 0–44)
AST: 16 U/L (ref 15–41)
Albumin: 4.2 g/dL (ref 3.5–5.0)
Alkaline Phosphatase: 64 U/L (ref 38–126)
Anion gap: 9 (ref 5–15)
BUN: 13 mg/dL (ref 8–23)
CO2: 24 mmol/L (ref 22–32)
Calcium: 8.6 mg/dL — ABNORMAL LOW (ref 8.9–10.3)
Chloride: 107 mmol/L (ref 98–111)
Creatinine: 1.12 mg/dL — ABNORMAL HIGH (ref 0.44–1.00)
GFR, Est AFR Am: 58 mL/min — ABNORMAL LOW (ref >60)
GFR, Estimated: 50 mL/min — ABNORMAL LOW (ref >60)
Glucose, Bld: 80 mg/dL (ref 70–99)
Potassium: 3.7 mmol/L (ref 3.5–5.1)
Sodium: 140 mmol/L (ref 135–145)
Total Bilirubin: 0.6 mg/dL (ref 0.3–1.2)
Total Protein: 6.5 g/dL (ref 6.5–8.1)

## 2019-08-20 LAB — CBC WITH DIFFERENTIAL (CANCER CENTER ONLY)
Abs Immature Granulocytes: 0.01 10*3/uL (ref 0.00–0.07)
Basophils Absolute: 0.1 10*3/uL (ref 0.0–0.1)
Basophils Relative: 1 %
Eosinophils Absolute: 0.1 10*3/uL (ref 0.0–0.5)
Eosinophils Relative: 1 %
HCT: 34.8 % — ABNORMAL LOW (ref 36.0–46.0)
Hemoglobin: 11.5 g/dL — ABNORMAL LOW (ref 12.0–15.0)
Immature Granulocytes: 0 %
Lymphocytes Relative: 37 %
Lymphs Abs: 2 10*3/uL (ref 0.7–4.0)
MCH: 30.3 pg (ref 26.0–34.0)
MCHC: 33 g/dL (ref 30.0–36.0)
MCV: 91.6 fL (ref 80.0–100.0)
Monocytes Absolute: 0.5 10*3/uL (ref 0.1–1.0)
Monocytes Relative: 9 %
Neutro Abs: 2.8 10*3/uL (ref 1.7–7.7)
Neutrophils Relative %: 52 %
Platelet Count: 275 10*3/uL (ref 150–400)
RBC: 3.8 MIL/uL — ABNORMAL LOW (ref 3.87–5.11)
RDW: 12.6 % (ref 11.5–15.5)
WBC Count: 5.3 10*3/uL (ref 4.0–10.5)
nRBC: 0 % (ref 0.0–0.2)

## 2019-08-20 MED ORDER — MONTELUKAST SODIUM 10 MG PO TABS
10.0000 mg | ORAL_TABLET | Freq: Once | ORAL | Status: AC
  Administered 2019-08-20: 10:00:00 10 mg via ORAL

## 2019-08-20 MED ORDER — ACETAMINOPHEN 325 MG PO TABS
ORAL_TABLET | ORAL | Status: AC
  Filled 2019-08-20: qty 2

## 2019-08-20 MED ORDER — DEXAMETHASONE 4 MG PO TABS
ORAL_TABLET | ORAL | Status: AC
  Filled 2019-08-20: qty 3

## 2019-08-20 MED ORDER — MONTELUKAST SODIUM 10 MG PO TABS
ORAL_TABLET | ORAL | Status: AC
  Filled 2019-08-20: qty 1

## 2019-08-20 MED ORDER — ACETAMINOPHEN 325 MG PO TABS
650.0000 mg | ORAL_TABLET | Freq: Once | ORAL | Status: AC
  Administered 2019-08-20: 10:00:00 650 mg via ORAL

## 2019-08-20 MED ORDER — DEXAMETHASONE 4 MG PO TABS
10.0000 mg | ORAL_TABLET | Freq: Once | ORAL | Status: AC
  Administered 2019-08-20: 10:00:00 10 mg via ORAL

## 2019-08-20 MED ORDER — DIPHENHYDRAMINE HCL 25 MG PO CAPS
25.0000 mg | ORAL_CAPSULE | Freq: Once | ORAL | Status: AC
  Administered 2019-08-20: 10:00:00 25 mg via ORAL

## 2019-08-20 MED ORDER — DARATUMUMAB-HYALURONIDASE-FIHJ 1800-30000 MG-UT/15ML ~~LOC~~ SOLN
1800.0000 mg | Freq: Once | SUBCUTANEOUS | Status: AC
  Administered 2019-08-20: 11:00:00 1800 mg via SUBCUTANEOUS
  Filled 2019-08-20: qty 15

## 2019-08-20 MED ORDER — DIPHENHYDRAMINE HCL 25 MG PO CAPS
ORAL_CAPSULE | ORAL | Status: AC
  Filled 2019-08-20: qty 1

## 2019-08-20 NOTE — Patient Instructions (Signed)
Daratumumab injection What is this medicine? DARATUMUMAB (dar a toom ue mab) is a monoclonal antibody. It is used to treat multiple myeloma. This medicine may be used for other purposes; ask your health care provider or pharmacist if you have questions. COMMON BRAND NAME(S): DARZALEX What should I tell my health care provider before I take this medicine? They need to know if you have any of these conditions:  infection (especially a virus infection such as chickenpox, herpes, or hepatitis B virus)  lung or breathing disease  an unusual or allergic reaction to daratumumab, other medicines, foods, dyes, or preservatives  pregnant or trying to get pregnant  breast-feeding How should I use this medicine? This medicine is for infusion into a vein. It is given by a health care professional in a hospital or clinic setting. Talk to your pediatrician regarding the use of this medicine in children. Special care may be needed. Overdosage: If you think you have taken too much of this medicine contact a poison control center or emergency room at once. NOTE: This medicine is only for you. Do not share this medicine with others. What if I miss a dose? Keep appointments for follow-up doses as directed. It is important not to miss your dose. Call your doctor or health care professional if you are unable to keep an appointment. What may interact with this medicine? Interactions have not been studied. This list may not describe all possible interactions. Give your health care provider a list of all the medicines, herbs, non-prescription drugs, or dietary supplements you use. Also tell them if you smoke, drink alcohol, or use illegal drugs. Some items may interact with your medicine. What should I watch for while using this medicine? This drug may make you feel generally unwell. Report any side effects. Continue your course of treatment even though you feel ill unless your doctor tells you to stop. This  medicine can cause serious allergic reactions. To reduce your risk you may need to take medicine before treatment with this medicine. Take your medicine as directed. This medicine can affect the results of blood tests to match your blood type. These changes can last for up to 6 months after the final dose. Your healthcare provider will do blood tests to match your blood type before you start treatment. Tell all of your healthcare providers that you are being treated with this medicine before receiving a blood transfusion. This medicine can affect the results of some tests used to determine treatment response; extra tests may be needed to evaluate response. Do not become pregnant while taking this medicine or for 3 months after stopping it. Women should inform their doctor if they wish to become pregnant or think they might be pregnant. There is a potential for serious side effects to an unborn child. Talk to your health care professional or pharmacist for more information. What side effects may I notice from receiving this medicine? Side effects that you should report to your doctor or health care professional as soon as possible:  allergic reactions like skin rash, itching or hives, swelling of the face, lips, or tongue  breathing problems  chills  cough  dizziness  feeling faint or lightheaded  headache  low blood counts - this medicine may decrease the number of white blood cells, red blood cells and platelets. You may be at increased risk for infections and bleeding.  nausea, vomiting  shortness of breath  signs of decreased platelets or bleeding - bruising, pinpoint red spots on  the skin, black, tarry stools, blood in the urine  signs of decreased red blood cells - unusually weak or tired, feeling faint or lightheaded, falls  signs of infection - fever or chills, cough, sore throat, pain or difficulty passing urine  signs and symptoms of liver injury like dark yellow or brown  urine; general ill feeling or flu-like symptoms; light-colored stools; loss of appetite; right upper belly pain; unusually weak or tired; yellowing of the eyes or skin Side effects that usually do not require medical attention (report to your doctor or health care professional if they continue or are bothersome):  back pain  constipation  diarrhea  joint pain  muscle cramps  pain, tingling, numbness in the hands or feet  swelling of the ankles, feet, hands  tiredness  trouble sleeping This list may not describe all possible side effects. Call your doctor for medical advice about side effects. You may report side effects to FDA at 1-800-FDA-1088. Where should I keep my medicine? This drug is given in a hospital or clinic and will not be stored at home. NOTE: This sheet is a summary. It may not cover all possible information. If you have questions about this medicine, talk to your doctor, pharmacist, or health care provider.  2020 Elsevier/Gold Standard (2019-04-03 18:10:54)  

## 2019-08-21 ENCOUNTER — Encounter: Payer: Self-pay | Admitting: Hematology and Oncology

## 2019-08-21 ENCOUNTER — Telehealth: Payer: Self-pay | Admitting: Hematology and Oncology

## 2019-08-21 DIAGNOSIS — L659 Nonscarring hair loss, unspecified: Secondary | ICD-10-CM | POA: Insufficient documentation

## 2019-08-21 LAB — KAPPA/LAMBDA LIGHT CHAINS
Kappa free light chain: 4.3 mg/L (ref 3.3–19.4)
Kappa, lambda light chain ratio: >2.87 — ABNORMAL HIGH (ref 0.26–1.65)
Lambda free light chains: <1.5 mg/L — ABNORMAL LOW (ref 5.7–26.3)

## 2019-08-21 NOTE — Telephone Encounter (Signed)
Scheduled per 1/11 sch msg. Called and spoke with pt, confirmed 1/25 appt  Cancelled port flush pt stated she does not have a port

## 2019-08-21 NOTE — Assessment & Plan Note (Signed)
I do not believe the cause of the alopecia is from her treatment I gave her prescription for cranial prosthesis to try

## 2019-08-21 NOTE — Progress Notes (Signed)
[facility] OFFICE PROGRESS NOTE  Patient Care Team: Jessica Huddle, MD as PCP - General (Internal Medicine) Jessica Pitman, MD as Consulting Physician (Hematology and Oncology) Jessica Dibble, MD as Consulting Physician (Hematology and Oncology)  ASSESSMENT & PLAN:  Multiple myeloma in remission [facility]) I have reviewed recent myeloma panel with the patient She has achieved maximum response to therapy She is still recovering from peripheral neuropathy, since discontinuation of Velcade I recommend continue on Faspro only We will continue myeloma panel once a month She agreed with the plan of care At present time, there is no need to escalate her treatment despite recent results of myeloma panel  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  Observe  Neuropathy due to chemotherapeutic drug She is developing mild worsening neuropathy due to Velcade Observe closely for now  Alopecia I do not believe the cause of the alopecia is from her treatment I gave her prescription for cranial prosthesis to try   No orders of the defined types were placed in this encounter.   All questions were answered. The patient knows to call the clinic with any problems, questions or concerns. The total time spent in the appointment was 20 minutes encounter with patients including review of chart and various tests results, discussions about plan of care and coordination of care plan   Jessica Lark, MD 08/21/2019 7:37 AM  INTERVAL HISTORY: Please see below for problem oriented charting. She returns for treatment and follow-up She is complaining of alopecia No worsening neuropathy No recent infection, fever or chills Denies infusion reaction Her blood pressure is noted to be slightly elevated intermittently  She has not been checking her blood pressure consistently at home although she  does complain of occasional dizziness  SUMMARY OF ONCOLOGIC HISTORY: Oncology History Overview Note   M-protein 0.69 gm/dl IFIX - IgG, Kappa IgG - 868 IgA - 19 IgM - < 20 Kappa - 21 Lambda - 5.7  09/06/2014 - Bone marrow aspirate and biopsy:   Normocellular marrow for age (40%) with a small monoclonal plasma cell population (1% on aspirate). Karyotype 56, XX  FISH Negative for myeloma associated changes  09/12/2014 - PET/CT  Two regions that are concerning for disease, one adjacent/involving the left ninth rib and one in the marrow of the right femur, in this patient with history of plasmacytoma.    Multiple myeloma in remission (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   04/01/2015 Bone Marrow Transplant   She received melphalan chemotherapy on 03/31/2015 followed by autologous stem cell transplant the day after   04/03/2015 - 04/18/2015 Hospital Admission   The patient was admitted to the hospital at [facility] for management related to complication from stem cell  transplant. She had significant nausea requiring intravenous anti-emetics.   07/17/2015 -  Chemotherapy   She started maintenance Revlimid and monthly zometa, then every 3 months   06/01/2018 Imaging   DEXA scan showed bone density T score in femur -2.3   04/20/2019 PET scan   1. No FDG avid osseous lesions or mass identified to suggest metabolically active lesion of myeloma or plasmacytoma. 2. Small nodular density within the paravertebral right lower lobe exhibits mild to moderate increased uptake within SUV max of 3.38. This is indeterminate. Review of CT chest from 10/05/2018 shows a corresponding Lung nodule in this area measuring the same. Small pulmonary neoplasm cannot be excluded. 3. Indeterminate, focal area of increased uptake is identified within the thoracic canal. Indeterminate favored to represent benign physiologic CNS activity.     04/30/2019 -  Chemotherapy   The patient had daratumumab-hyaluronidase-fihj (DARZALEX FASPRO) 1800-30000 MG-UT/15ML chemo SQ injection 1,800 mg, 1,800 mg, Subcutaneous,  Once, 5 of 9 cycles Administration: 1,800 mg (04/30/2019), 1,800 mg (05/07/2019), 1,800 mg (05/14/2019), 1,800 mg (05/21/2019), 1,800 mg (05/28/2019), 1,800 mg (06/04/2019), 1,800 mg (06/11/2019), 1,800 mg (06/18/2019), 1,800 mg (06/25/2019), 1,800 mg (07/09/2019), 1,800 mg (07/23/2019), 1,800 mg (08/06/2019), 1,800 mg (08/20/2019)  for chemotherapy treatment.    04/30/2019 - 07/22/2019 Chemotherapy   The patient had bortezomib SQ (VELCADE) chemo injection 1.75 mg, 1.3 mg/m2 = 1.75 mg, Subcutaneous,  Once, 10 of 11 cycles Administration: 1.75 mg (04/30/2019), 1.75 mg (05/07/2019), 1.75 mg (05/14/2019), 1.75 mg (05/21/2019), 1.75 mg (05/28/2019), 1.75 mg (06/04/2019), 1.75 mg (06/11/2019), 1.75 mg (06/18/2019), 1.75 mg (06/25/2019), 1.75 mg (07/09/2019)  for chemotherapy treatment.      REVIEW OF SYSTEMS:   Constitutional: Denies fevers, chills or abnormal weight loss Eyes: Denies blurriness  of vision Ears, nose, mouth, throat, and face: Denies mucositis or sore throat Respiratory: Denies cough, dyspnea or wheezes Cardiovascular: Denies palpitation, chest discomfort or lower extremity swelling Gastrointestinal:  Denies nausea, heartburn or change in bowel habits Lymphatics: Denies new lymphadenopathy or easy bruising Neurological:Denies numbness, tingling or new weaknesses Behavioral/Psych: Mood is stable, no new changes  All other systems were reviewed with the patient and are negative.  I have reviewed the past medical history, past surgical history, social history and family history with the patient and they are unchanged from previous note.  ALLERGIES:  is allergic to augmentin [amoxicillin-pot clavulanate]; albuterol; codeine; ibuprofen; nsaids; and tolmetin.  MEDICATIONS:  Current Outpatient Medications  Medication Sig Dispense Refill  . acetaminophen (TYLENOL) 500 MG tablet Take 500-1,000 mg by mouth every 6 (six) hours as needed for mild pain, moderate pain, fever or headache.    Marland Kitchen acyclovir (ZOVIRAX) 400 MG tablet Take 1 tablet (400 mg total) by mouth 2 (two) times daily. (Patient not taking: Reported on 07/23/2019) 180 tablet 3  . aspirin EC 81 MG tablet Take 81 mg by mouth daily with breakfast.    . calcium carbonate (OSCAL) 1500 (600 Ca) MG TABS tablet Take 1,500 mg by mouth daily with breakfast.    . Calcium Carbonate 500 MG CHEW Chew 500 mg by mouth as needed for indigestion.    . Cholecalciferol (VITAMIN D3) 2000 units  capsule Take 2,000 Units by mouth daily.     . hydrochlorothiazide (HYDRODIURIL) 12.5 MG tablet Take 1 tablet (12.5 mg total) by mouth daily. 30 tablet 11  . HYDROcodone-acetaminophen (NORCO/VICODIN) 5-325 MG tablet Take 1 tablet by mouth every 6 (six) hours as needed for moderate pain. (Patient not taking: Reported on 07/23/2019) 10 tablet 0  . Multiple Vitamins-Minerals (CENTRUM SILVER PO) Take 1 tablet by mouth daily.     Marland Kitchen omeprazole (PRILOSEC)  20 MG capsule Take 1 capsule (20 mg total) by mouth daily. 90 capsule 11  . ondansetron (ZOFRAN ODT) 4 MG disintegrating tablet Take 1 tablet (4 mg total) by mouth every 8 (eight) hours as needed for nausea or vomiting. 12 tablet 1  . ondansetron (ZOFRAN) 8 MG tablet Take 1 tablet (8 mg total) by mouth every 8 (eight) hours as needed for nausea or vomiting. 60 tablet 1  . senna-docusate (SENOKOT-S) 8.6-50 MG tablet Take 1-2 tablets by mouth daily as needed for mild constipation or moderate constipation.     . sodium chloride (OCEAN) 0.65 % SOLN nasal spray Place 1 spray into both nostrils as needed for congestion.     No current facility-administered medications for this visit.   Facility-Administered Medications Ordered in Other Visits  Medication Dose Route Frequency Provider Last Rate Last Admin  . 0.9 %  sodium chloride infusion   Intravenous Once Jessica Lark, MD        PHYSICAL EXAMINATION: ECOG PERFORMANCE STATUS: 1 - Symptomatic but completely ambulatory  Vitals:   08/20/19 0831  BP: (!) 146/89  Pulse: 80  Resp: 18  Temp: 98 F (36.7 C)  SpO2: 100%   Filed Weights   08/20/19 0831  Weight: 113 lb 6.4 oz (51.4 kg)    GENERAL:alert, no distress and comfortable SKIN: skin color, texture, turgor are normal, no rashes or significant lesions.  Noted alopecia at the top of her scalp EYES: normal, Conjunctiva are pink and non-injected, sclera clear OROPHARYNX:no exudate, no erythema and lips, buccal mucosa, and tongue normal  NECK: supple, thyroid normal size, non-tender, without nodularity LYMPH:  no palpable lymphadenopathy in the cervical, axillary or inguinal LUNGS: clear to auscultation and percussion with normal breathing effort HEART: regular rate & rhythm and no murmurs and no lower extremity edema ABDOMEN:abdomen soft, non-tender and normal bowel sounds Musculoskeletal:no cyanosis of digits and no clubbing  NEURO: alert & oriented x 3 with fluent speech, no focal  motor/sensory deficits  LABORATORY DATA:  I have reviewed the data as listed    Component Value Date/Time   NA 140 08/20/2019 0813   NA 143 07/14/2017 1245   K 3.7 08/20/2019 0813   K 3.3 (L) 07/14/2017 1245   CL 107 08/20/2019 0813   CO2 24 08/20/2019 0813   CO2 20 (L) 07/14/2017 1245   GLUCOSE 80 08/20/2019 0813   GLUCOSE 106 07/14/2017 1245   BUN 13 08/20/2019 0813   BUN 9.3 07/14/2017 1245   CREATININE 1.12 (H) 08/20/2019 0813   CREATININE 0.8 07/14/2017 1245   CALCIUM 8.6 (L) 08/20/2019 0813   CALCIUM 8.4 07/14/2017 1245   PROT 6.5 08/20/2019 0813   PROT 6.0 07/14/2017 1245   PROT 6.2 (L) 07/14/2017 1245   ALBUMIN 4.2 08/20/2019 0813   ALBUMIN 3.6 07/14/2017 1245   AST 16 08/20/2019 0813   AST 21 07/14/2017 1245   ALT 10 08/20/2019 0813   ALT 21 07/14/2017 1245   ALKPHOS 64 08/20/2019 0813   ALKPHOS 78 07/14/2017 1245  BILITOT 0.6 08/20/2019 0813   BILITOT 0.43 07/14/2017 1245   GFRNONAA 50 (L) 08/20/2019 0813   GFRAA 58 (L) 08/20/2019 0813    No results found for: SPEP, UPEP  Lab Results  Component Value Date   WBC 5.3 08/20/2019   NEUTROABS 2.8 08/20/2019   HGB 11.5 (L) 08/20/2019   HCT 34.8 (L) 08/20/2019   MCV 91.6 08/20/2019   PLT 275 08/20/2019      Chemistry      Component Value Date/Time   NA 140 08/20/2019 0813   NA 143 07/14/2017 1245   K 3.7 08/20/2019 0813   K 3.3 (L) 07/14/2017 1245   CL 107 08/20/2019 0813   CO2 24 08/20/2019 0813   CO2 20 (L) 07/14/2017 1245   BUN 13 08/20/2019 0813   BUN 9.3 07/14/2017 1245   CREATININE 1.12 (H) 08/20/2019 0813   CREATININE 0.8 07/14/2017 1245      Component Value Date/Time   CALCIUM 8.6 (L) 08/20/2019 0813   CALCIUM 8.4 07/14/2017 1245   ALKPHOS 64 08/20/2019 0813   ALKPHOS 78 07/14/2017 1245   AST 16 08/20/2019 0813   AST 21 07/14/2017 1245   ALT 10 08/20/2019 0813   ALT 21 07/14/2017 1245   BILITOT 0.6 08/20/2019 0813   BILITOT 0.43 07/14/2017 1245

## 2019-08-21 NOTE — Assessment & Plan Note (Signed)
She is developing mild worsening neuropathy due to Velcade Observe closely for now

## 2019-08-21 NOTE — Assessment & Plan Note (Signed)
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  Observe  

## 2019-08-21 NOTE — Assessment & Plan Note (Signed)
I have reviewed recent myeloma panel with the patient She has achieved maximum response to therapy She is still recovering from peripheral neuropathy, since discontinuation of Velcade I recommend continue on Faspro only We will continue myeloma panel once a month She agreed with the plan of care At present time, there is no need to escalate her treatment despite recent results of myeloma panel

## 2019-08-23 LAB — MULTIPLE MYELOMA PANEL, SERUM
Albumin SerPl Elph-Mcnc: 3.8 g/dL (ref 2.9–4.4)
Albumin/Glob SerPl: 1.8 — ABNORMAL HIGH (ref 0.7–1.7)
Alpha 1: 0.2 g/dL (ref 0.0–0.4)
Alpha2 Glob SerPl Elph-Mcnc: 0.8 g/dL (ref 0.4–1.0)
B-Globulin SerPl Elph-Mcnc: 1 g/dL (ref 0.7–1.3)
Gamma Glob SerPl Elph-Mcnc: 0.3 g/dL — ABNORMAL LOW (ref 0.4–1.8)
Globulin, Total: 2.2 g/dL (ref 2.2–3.9)
IgA: <5 mg/dL — ABNORMAL LOW (ref 87–352)
IgG (Immunoglobin G), Serum: 372 mg/dL — ABNORMAL LOW (ref 586–1602)
IgM (Immunoglobulin M), Srm: <5 mg/dL — ABNORMAL LOW (ref 26–217)
M Protein SerPl Elph-Mcnc: 0.2 g/dL — ABNORMAL HIGH
Total Protein ELP: 6 g/dL (ref 6.0–8.5)

## 2019-09-03 ENCOUNTER — Other Ambulatory Visit: Payer: Self-pay

## 2019-09-03 ENCOUNTER — Inpatient Hospital Stay: Payer: Medicare Other

## 2019-09-03 ENCOUNTER — Other Ambulatory Visit: Payer: Medicare Other

## 2019-09-03 VITALS — BP 145/92 | HR 74 | Temp 99.0°F | Resp 18

## 2019-09-03 DIAGNOSIS — C9001 Multiple myeloma in remission: Secondary | ICD-10-CM

## 2019-09-03 DIAGNOSIS — Z5112 Encounter for antineoplastic immunotherapy: Secondary | ICD-10-CM | POA: Diagnosis not present

## 2019-09-03 DIAGNOSIS — Z9481 Bone marrow transplant status: Secondary | ICD-10-CM

## 2019-09-03 DIAGNOSIS — Z7189 Other specified counseling: Secondary | ICD-10-CM

## 2019-09-03 LAB — CMP (CANCER CENTER ONLY)
ALT: 10 U/L (ref 0–44)
AST: 17 U/L (ref 15–41)
Albumin: 4 g/dL (ref 3.5–5.0)
Alkaline Phosphatase: 56 U/L (ref 38–126)
Anion gap: 9 (ref 5–15)
BUN: 10 mg/dL (ref 8–23)
CO2: 24 mmol/L (ref 22–32)
Calcium: 8.8 mg/dL — ABNORMAL LOW (ref 8.9–10.3)
Chloride: 109 mmol/L (ref 98–111)
Creatinine: 1.03 mg/dL — ABNORMAL HIGH (ref 0.44–1.00)
GFR, Est AFR Am: >60 mL/min (ref >60)
GFR, Estimated: 55 mL/min — ABNORMAL LOW (ref >60)
Glucose, Bld: 81 mg/dL (ref 70–99)
Potassium: 3.7 mmol/L (ref 3.5–5.1)
Sodium: 142 mmol/L (ref 135–145)
Total Bilirubin: 0.5 mg/dL (ref 0.3–1.2)
Total Protein: 6.4 g/dL — ABNORMAL LOW (ref 6.5–8.1)

## 2019-09-03 LAB — CBC WITH DIFFERENTIAL (CANCER CENTER ONLY)
Abs Immature Granulocytes: 0.01 10*3/uL (ref 0.00–0.07)
Basophils Absolute: 0.1 10*3/uL (ref 0.0–0.1)
Basophils Relative: 1 %
Eosinophils Absolute: 0.1 10*3/uL (ref 0.0–0.5)
Eosinophils Relative: 2 %
HCT: 34.7 % — ABNORMAL LOW (ref 36.0–46.0)
Hemoglobin: 11.4 g/dL — ABNORMAL LOW (ref 12.0–15.0)
Immature Granulocytes: 0 %
Lymphocytes Relative: 40 %
Lymphs Abs: 1.8 10*3/uL (ref 0.7–4.0)
MCH: 29.8 pg (ref 26.0–34.0)
MCHC: 32.9 g/dL (ref 30.0–36.0)
MCV: 90.8 fL (ref 80.0–100.0)
Monocytes Absolute: 0.5 10*3/uL (ref 0.1–1.0)
Monocytes Relative: 11 %
Neutro Abs: 2.1 10*3/uL (ref 1.7–7.7)
Neutrophils Relative %: 46 %
Platelet Count: 270 10*3/uL (ref 150–400)
RBC: 3.82 MIL/uL — ABNORMAL LOW (ref 3.87–5.11)
RDW: 12.6 % (ref 11.5–15.5)
WBC Count: 4.5 10*3/uL (ref 4.0–10.5)
nRBC: 0 % (ref 0.0–0.2)

## 2019-09-03 MED ORDER — ACETAMINOPHEN 325 MG PO TABS
650.0000 mg | ORAL_TABLET | Freq: Once | ORAL | Status: AC
  Administered 2019-09-03: 650 mg via ORAL

## 2019-09-03 MED ORDER — ACETAMINOPHEN 325 MG PO TABS
ORAL_TABLET | ORAL | Status: AC
  Filled 2019-09-03: qty 2

## 2019-09-03 MED ORDER — DIPHENHYDRAMINE HCL 25 MG PO CAPS
25.0000 mg | ORAL_CAPSULE | Freq: Once | ORAL | Status: AC
  Administered 2019-09-03: 25 mg via ORAL

## 2019-09-03 MED ORDER — DEXAMETHASONE 4 MG PO TABS
ORAL_TABLET | ORAL | Status: AC
  Filled 2019-09-03: qty 3

## 2019-09-03 MED ORDER — DIPHENHYDRAMINE HCL 25 MG PO CAPS
ORAL_CAPSULE | ORAL | Status: AC
  Filled 2019-09-03: qty 1

## 2019-09-03 MED ORDER — DEXAMETHASONE 4 MG PO TABS
10.0000 mg | ORAL_TABLET | Freq: Once | ORAL | Status: AC
  Administered 2019-09-03: 10 mg via ORAL

## 2019-09-03 MED ORDER — MONTELUKAST SODIUM 10 MG PO TABS
ORAL_TABLET | ORAL | Status: AC
  Filled 2019-09-03: qty 1

## 2019-09-03 MED ORDER — DARATUMUMAB-HYALURONIDASE-FIHJ 1800-30000 MG-UT/15ML ~~LOC~~ SOLN
1800.0000 mg | Freq: Once | SUBCUTANEOUS | Status: AC
  Administered 2019-09-03: 1800 mg via SUBCUTANEOUS
  Filled 2019-09-03: qty 15

## 2019-09-03 MED ORDER — MONTELUKAST SODIUM 10 MG PO TABS
10.0000 mg | ORAL_TABLET | Freq: Once | ORAL | Status: AC
  Administered 2019-09-03: 10 mg via ORAL

## 2019-09-03 NOTE — Patient Instructions (Signed)
[facility] Discharge Instructions for Patients Receiving Chemotherapy  Today you received the following chemotherapy agents: Darzalex Faspro  To help prevent nausea and vomiting after your treatment, we encourage you to take your nausea medication  as prescribed.    If you develop nausea and vomiting that is not controlled by your nausea medication, call the clinic.   BELOW ARE SYMPTOMS THAT SHOULD BE REPORTED IMMEDIATELY:  *FEVER GREATER THAN 100.5 F  *CHILLS WITH OR WITHOUT FEVER  NAUSEA AND VOMITING THAT IS NOT CONTROLLED WITH YOUR NAUSEA MEDICATION  *UNUSUAL SHORTNESS OF BREATH  *UNUSUAL BRUISING OR BLEEDING  TENDERNESS IN MOUTH AND THROAT WITH OR WITHOUT PRESENCE OF ULCERS  *URINARY PROBLEMS  *BOWEL PROBLEMS  UNUSUAL RASH Items with * indicate a potential emergency and should be followed up as soon as possible.  Feel free to call the clinic should you have any questions or concerns. The clinic phone number is [phone].  Please show the CHEMO ALERT CARD at check-in to the Emergency Department and triage nurse.   

## 2019-09-17 ENCOUNTER — Encounter: Payer: Self-pay | Admitting: Hematology and Oncology

## 2019-09-17 ENCOUNTER — Other Ambulatory Visit: Payer: Self-pay

## 2019-09-17 ENCOUNTER — Inpatient Hospital Stay: Payer: Medicare Other

## 2019-09-17 ENCOUNTER — Inpatient Hospital Stay: Payer: Medicare Other | Attending: Hematology and Oncology

## 2019-09-17 ENCOUNTER — Inpatient Hospital Stay (HOSPITAL_BASED_OUTPATIENT_CLINIC_OR_DEPARTMENT_OTHER): Payer: Medicare Other | Admitting: Hematology and Oncology

## 2019-09-17 DIAGNOSIS — C9001 Multiple myeloma in remission: Secondary | ICD-10-CM

## 2019-09-17 DIAGNOSIS — Z7189 Other specified counseling: Secondary | ICD-10-CM

## 2019-09-17 DIAGNOSIS — G620 Drug-induced polyneuropathy: Secondary | ICD-10-CM | POA: Diagnosis not present

## 2019-09-17 DIAGNOSIS — I10 Essential (primary) hypertension: Secondary | ICD-10-CM | POA: Diagnosis not present

## 2019-09-17 DIAGNOSIS — Z9221 Personal history of antineoplastic chemotherapy: Secondary | ICD-10-CM | POA: Insufficient documentation

## 2019-09-17 DIAGNOSIS — Z9481 Bone marrow transplant status: Secondary | ICD-10-CM

## 2019-09-17 DIAGNOSIS — Z5112 Encounter for antineoplastic immunotherapy: Secondary | ICD-10-CM | POA: Insufficient documentation

## 2019-09-17 DIAGNOSIS — T451X5A Adverse effect of antineoplastic and immunosuppressive drugs, initial encounter: Secondary | ICD-10-CM | POA: Diagnosis not present

## 2019-09-17 DIAGNOSIS — J984 Other disorders of lung: Secondary | ICD-10-CM | POA: Diagnosis not present

## 2019-09-17 DIAGNOSIS — Z880 Allergy status to penicillin: Secondary | ICD-10-CM | POA: Insufficient documentation

## 2019-09-17 DIAGNOSIS — Z886 Allergy status to analgesic agent status: Secondary | ICD-10-CM | POA: Insufficient documentation

## 2019-09-17 DIAGNOSIS — R911 Solitary pulmonary nodule: Secondary | ICD-10-CM | POA: Diagnosis not present

## 2019-09-17 DIAGNOSIS — D6481 Anemia due to antineoplastic chemotherapy: Secondary | ICD-10-CM | POA: Insufficient documentation

## 2019-09-17 DIAGNOSIS — Z9484 Stem cells transplant status: Secondary | ICD-10-CM | POA: Diagnosis not present

## 2019-09-17 DIAGNOSIS — Z79899 Other long term (current) drug therapy: Secondary | ICD-10-CM | POA: Insufficient documentation

## 2019-09-17 DIAGNOSIS — D638 Anemia in other chronic diseases classified elsewhere: Secondary | ICD-10-CM | POA: Diagnosis not present

## 2019-09-17 DIAGNOSIS — Z885 Allergy status to narcotic agent status: Secondary | ICD-10-CM | POA: Diagnosis not present

## 2019-09-17 DIAGNOSIS — Z923 Personal history of irradiation: Secondary | ICD-10-CM | POA: Insufficient documentation

## 2019-09-17 DIAGNOSIS — Z881 Allergy status to other antibiotic agents status: Secondary | ICD-10-CM | POA: Diagnosis not present

## 2019-09-17 LAB — CBC WITH DIFFERENTIAL (CANCER CENTER ONLY)
Abs Immature Granulocytes: 0.01 10*3/uL (ref 0.00–0.07)
Basophils Absolute: 0.1 10*3/uL (ref 0.0–0.1)
Basophils Relative: 1 %
Eosinophils Absolute: 0.1 10*3/uL (ref 0.0–0.5)
Eosinophils Relative: 2 %
HCT: 34.6 % — ABNORMAL LOW (ref 36.0–46.0)
Hemoglobin: 11.6 g/dL — ABNORMAL LOW (ref 12.0–15.0)
Immature Granulocytes: 0 %
Lymphocytes Relative: 41 %
Lymphs Abs: 1.9 10*3/uL (ref 0.7–4.0)
MCH: 30.4 pg (ref 26.0–34.0)
MCHC: 33.5 g/dL (ref 30.0–36.0)
MCV: 90.6 fL (ref 80.0–100.0)
Monocytes Absolute: 0.5 10*3/uL (ref 0.1–1.0)
Monocytes Relative: 10 %
Neutro Abs: 2.3 10*3/uL (ref 1.7–7.7)
Neutrophils Relative %: 46 %
Platelet Count: 267 10*3/uL (ref 150–400)
RBC: 3.82 MIL/uL — ABNORMAL LOW (ref 3.87–5.11)
RDW: 12.1 % (ref 11.5–15.5)
WBC Count: 4.8 10*3/uL (ref 4.0–10.5)
nRBC: 0 % (ref 0.0–0.2)

## 2019-09-17 LAB — CMP (CANCER CENTER ONLY)
ALT: 15 U/L (ref 0–44)
AST: 18 U/L (ref 15–41)
Albumin: 4 g/dL (ref 3.5–5.0)
Alkaline Phosphatase: 72 U/L (ref 38–126)
Anion gap: 10 (ref 5–15)
BUN: 8 mg/dL (ref 8–23)
CO2: 23 mmol/L (ref 22–32)
Calcium: 8.9 mg/dL (ref 8.9–10.3)
Chloride: 107 mmol/L (ref 98–111)
Creatinine: 0.96 mg/dL (ref 0.44–1.00)
GFR, Est AFR Am: >60 mL/min (ref >60)
GFR, Estimated: >60 mL/min (ref >60)
Glucose, Bld: 79 mg/dL (ref 70–99)
Potassium: 4.3 mmol/L (ref 3.5–5.1)
Sodium: 140 mmol/L (ref 135–145)
Total Bilirubin: 0.5 mg/dL (ref 0.3–1.2)
Total Protein: 6.5 g/dL (ref 6.5–8.1)

## 2019-09-17 MED ORDER — DEXAMETHASONE 4 MG PO TABS
10.0000 mg | ORAL_TABLET | Freq: Once | ORAL | Status: AC
  Administered 2019-09-17: 10 mg via ORAL

## 2019-09-17 MED ORDER — DIPHENHYDRAMINE HCL 25 MG PO CAPS
25.0000 mg | ORAL_CAPSULE | Freq: Once | ORAL | Status: AC
  Administered 2019-09-17: 25 mg via ORAL

## 2019-09-17 MED ORDER — DARATUMUMAB-HYALURONIDASE-FIHJ 1800-30000 MG-UT/15ML ~~LOC~~ SOLN
1800.0000 mg | Freq: Once | SUBCUTANEOUS | Status: AC
  Administered 2019-09-17: 10:00:00 1800 mg via SUBCUTANEOUS
  Filled 2019-09-17: qty 15

## 2019-09-17 MED ORDER — ZOLEDRONIC ACID 4 MG/5ML IV CONC
3.0000 mg | Freq: Once | INTRAVENOUS | Status: AC
  Administered 2019-09-17: 3 mg via INTRAVENOUS
  Filled 2019-09-17: qty 3.75

## 2019-09-17 MED ORDER — ACETAMINOPHEN 325 MG PO TABS
ORAL_TABLET | ORAL | Status: AC
  Filled 2019-09-17: qty 2

## 2019-09-17 MED ORDER — ACETAMINOPHEN 325 MG PO TABS
650.0000 mg | ORAL_TABLET | Freq: Once | ORAL | Status: AC
  Administered 2019-09-17: 09:00:00 650 mg via ORAL

## 2019-09-17 MED ORDER — SODIUM CHLORIDE 0.9% FLUSH
10.0000 mL | INTRAVENOUS | Status: DC | PRN
  Filled 2019-09-17: qty 10

## 2019-09-17 MED ORDER — MONTELUKAST SODIUM 10 MG PO TABS
10.0000 mg | ORAL_TABLET | Freq: Once | ORAL | Status: AC
  Administered 2019-09-17: 10 mg via ORAL

## 2019-09-17 MED ORDER — SODIUM CHLORIDE 0.9 % IV SOLN
Freq: Once | INTRAVENOUS | Status: DC
  Filled 2019-09-17: qty 250

## 2019-09-17 MED ORDER — SODIUM CHLORIDE 0.9 % IV SOLN
Freq: Once | INTRAVENOUS | Status: AC
  Filled 2019-09-17: qty 250

## 2019-09-17 MED ORDER — ZOLEDRONIC ACID 4 MG/100ML IV SOLN
INTRAVENOUS | Status: AC
  Filled 2019-09-17: qty 100

## 2019-09-17 MED ORDER — DIPHENHYDRAMINE HCL 25 MG PO CAPS
ORAL_CAPSULE | ORAL | Status: AC
  Filled 2019-09-17: qty 1

## 2019-09-17 NOTE — Assessment & Plan Note (Signed)
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  Observe  

## 2019-09-17 NOTE — Progress Notes (Signed)
[place] OFFICE PROGRESS NOTE  Patient Care Team: Josetta Huddle, MD as PCP - General (Internal Medicine) Ginette Pitman, MD as Consulting Physician (Hematology and Oncology) Hessie Dibble, MD as Consulting Physician (Hematology and Oncology)  ASSESSMENT & PLAN:  Multiple myeloma in remission [facility]) I have reviewed recent myeloma panel with the patient She has achieved maximum response to therapy She is still recovering from peripheral neuropathy, since discontinuation of Velcade I recommend continue on Faspro only We will continue myeloma panel once a month She agreed with the plan of care She will continue Zometa every 3 months along with calcium and vitamin D supplement  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  Observe  Essential hypertension Her blood pressure control is satisfactory I will defer to her primary care doctor for management  Neuropathy due to chemotherapeutic drug She is developing mild worsening neuropathy due to recent Velcade, likely exacerbated by cold weather Observe closely for now   No orders of the defined types were placed in this encounter.   All questions were answered. The patient knows to call the clinic with any problems, questions or concerns. The total time spent in the appointment was 20 minutes encounter with patients including review of chart and various tests results, discussions about plan of care and coordination of care plan   [name], MD 09/17/2019 10:04 AM  INTERVAL HISTORY: Please see below for problem oriented charting. She returns for further follow-up She brought with her blood pressure monitoring over the past few weeks Her blood pressure is satisfactory apart from occasional blood pressure elevated over 761 systolic She also noticed some mild worsening neuropathy especially at nighttime No new  bone pain No recent infection, fever or chills  SUMMARY OF ONCOLOGIC HISTORY: Oncology History Overview Note   M-protein 0.69 gm/dl IFIX - IgG, Kappa IgG - 868 IgA - 19 IgM - < 20 Kappa - 21 Lambda - 5.7  09/06/2014 - Bone marrow aspirate and biopsy:   Normocellular marrow for age (40%) with a small monoclonal plasma cell population (1% on aspirate). Karyotype 70, XX  FISH Negative for myeloma associated changes  09/12/2014 - PET/CT  Two regions that are concerning for disease, one adjacent/involving the left ninth rib and one in the marrow of the right femur, in this patient with history of plasmacytoma.    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   04/01/2015 Bone Marrow Transplant   She received melphalan chemotherapy on 03/31/2015 followed by autologous stem cell transplant the day after   04/03/2015 - 04/18/2015 Hospital Admission   The patient was admitted to the hospital at [place] for management related to complication from stem cell  transplant. She had significant nausea requiring intravenous anti-emetics.   07/17/2015 -  Chemotherapy   She started maintenance Revlimid and monthly zometa, then every 3 months   06/01/2018 Imaging   DEXA scan showed bone density T score in femur -2.3   04/20/2019 PET scan   1. No FDG avid osseous lesions or mass identified to suggest metabolically active lesion of myeloma or plasmacytoma. 2. Small nodular density within the paravertebral right lower lobe exhibits mild to moderate increased uptake within SUV max of 3.38. This is indeterminate. Review of CT chest from 10/05/2018 shows a corresponding Lung nodule in this area measuring the same. Small pulmonary neoplasm cannot be excluded. 3. Indeterminate, focal area of increased uptake is identified within the thoracic canal. Indeterminate favored to represent benign physiologic CNS activity.     04/30/2019 -  Chemotherapy   The patient had daratumumab-hyaluronidase-fihj (DARZALEX FASPRO) 1800-30000 MG-UT/15ML chemo SQ injection 1,800 mg, 1,800 mg, Subcutaneous,  Once, 6 of 9 cycles Administration: 1,800 mg (04/30/2019), 1,800 mg (05/07/2019), 1,800 mg (05/14/2019), 1,800 mg (05/21/2019), 1,800 mg (05/28/2019), 1,800 mg (06/04/2019), 1,800 mg (06/11/2019), 1,800 mg (06/18/2019), 1,800 mg (06/25/2019), 1,800 mg (07/09/2019), 1,800 mg (07/23/2019), 1,800 mg (08/06/2019), 1,800 mg (08/20/2019), 1,800 mg (09/03/2019)  for chemotherapy treatment.    04/30/2019 - 07/22/2019 Chemotherapy   The patient had bortezomib SQ (VELCADE) chemo injection 1.75 mg, 1.3 mg/m2 = 1.75 mg, Subcutaneous,  Once, 10 of 11 cycles Administration: 1.75 mg (04/30/2019), 1.75 mg (05/07/2019), 1.75 mg (05/14/2019), 1.75 mg (05/21/2019), 1.75 mg (05/28/2019), 1.75 mg (06/04/2019), 1.75 mg (06/11/2019), 1.75 mg (06/18/2019), 1.75 mg (06/25/2019), 1.75 mg (07/09/2019)  for chemotherapy treatment.      REVIEW OF SYSTEMS:   Constitutional: Denies fevers, chills or abnormal weight  loss Eyes: Denies blurriness of vision Ears, nose, mouth, throat, and face: Denies mucositis or sore throat Respiratory: Denies cough, dyspnea or wheezes Cardiovascular: Denies palpitation, chest discomfort or lower extremity swelling Gastrointestinal:  Denies nausea, heartburn or change in bowel habits Skin: Denies abnormal skin rashes Lymphatics: Denies new lymphadenopathy or easy bruising Behavioral/Psych: Mood is stable, no new changes  All other systems were reviewed with the patient and are negative.  I have reviewed the past medical history, past surgical history, social history and family history with the patient and they are unchanged from previous note.  ALLERGIES:  is allergic to augmentin [amoxicillin-pot clavulanate]; albuterol; codeine; ibuprofen; nsaids; and tolmetin.  MEDICATIONS:  Current Outpatient Medications  Medication Sig Dispense Refill  . acetaminophen (TYLENOL) 500 MG tablet Take 500-1,000 mg by mouth every 6 (six) hours as needed for mild pain, moderate pain, fever or headache.    Marland Kitchen acyclovir (ZOVIRAX) 400 MG tablet Take 1 tablet (400 mg total) by mouth 2 (two) times daily. 180 tablet 3  . aspirin EC 81 MG tablet Take 81 mg by mouth daily with breakfast.    . calcium carbonate (OSCAL) 1500 (600 Ca) MG TABS tablet Take 1,500 mg by mouth daily with breakfast.    . Calcium Carbonate 500 MG CHEW Chew 500 mg by mouth as needed for indigestion.    . Cholecalciferol (VITAMIN D3) 2000 units capsule Take 2,000 Units by mouth daily.     Marland Kitchen  Multiple Vitamins-Minerals (CENTRUM SILVER PO) Take 1 tablet by mouth daily.     Marland Kitchen omeprazole (PRILOSEC) 20 MG capsule Take 1 capsule (20 mg total) by mouth daily. 90 capsule 11  . ondansetron (ZOFRAN) 8 MG tablet Take 1 tablet (8 mg total) by mouth every 8 (eight) hours as needed for nausea or vomiting. 60 tablet 1  . senna-docusate (SENOKOT-S) 8.6-50 MG tablet Take 1-2 tablets by mouth daily as needed for mild constipation or moderate  constipation.     . sodium chloride (OCEAN) 0.65 % SOLN nasal spray Place 1 spray into both nostrils as needed for congestion.     No current facility-administered medications for this visit.   Facility-Administered Medications Ordered in Other Visits  Medication Dose Route Frequency Provider Last Rate Last Admin  . 0.9 %  sodium chloride infusion   Intravenous Once Cassaundra Rasch, MD      . 0.9 %  sodium chloride infusion   Intravenous Once Alvy Bimler, Didi Ganaway, MD      . daratumumab-hyaluronidase-fihj (DARZALEX FASPRO) 1800-30000 MG-UT/15ML chemo SQ injection 1,800 mg  1,800 mg Subcutaneous Once Sohan Potvin, MD      . sodium chloride flush (NS) 0.9 % injection 10 mL  10 mL Intracatheter PRN Alvy Bimler, Charlina Dwight, MD        PHYSICAL EXAMINATION: ECOG PERFORMANCE STATUS: 1 - Symptomatic but completely ambulatory  Vitals:   09/17/19 0839  BP: (!) 148/99  Pulse: 73  Resp: 18  Temp: 97.8 F (36.6 C)  SpO2: 100%   Filed Weights   09/17/19 0839  Weight: 116 lb 12.8 oz (53 kg)    GENERAL:alert, no distress and comfortable SKIN: skin color, texture, turgor are normal, no rashes or significant lesions EYES: normal, Conjunctiva are pink and non-injected, sclera clear OROPHARYNX:no exudate, no erythema and lips, buccal mucosa, and tongue normal  NECK: supple, thyroid normal size, non-tender, without nodularity LYMPH:  no palpable lymphadenopathy in the cervical, axillary or inguinal LUNGS: clear to auscultation and percussion with normal breathing effort HEART: regular rate & rhythm and no murmurs and no lower extremity edema ABDOMEN:abdomen soft, non-tender and normal bowel sounds Musculoskeletal:no cyanosis of digits and no clubbing  NEURO: alert & oriented x 3 with fluent speech, no focal motor/sensory deficits  LABORATORY DATA:  I have reviewed the data as listed    Component Value Date/Time   NA 140 09/17/2019 0800   NA 143 07/14/2017 1245   K 4.3 09/17/2019 0800   K 3.3 (L) 07/14/2017 1245    CL 107 09/17/2019 0800   CO2 23 09/17/2019 0800   CO2 20 (L) 07/14/2017 1245   GLUCOSE 79 09/17/2019 0800   GLUCOSE 106 07/14/2017 1245   BUN 8 09/17/2019 0800   BUN 9.3 07/14/2017 1245   CREATININE 0.96 09/17/2019 0800   CREATININE 0.8 07/14/2017 1245   CALCIUM 8.9 09/17/2019 0800   CALCIUM 8.4 07/14/2017 1245   PROT 6.5 09/17/2019 0800   PROT 6.0 07/14/2017 1245   PROT 6.2 (L) 07/14/2017 1245   ALBUMIN 4.0 09/17/2019 0800   ALBUMIN 3.6 07/14/2017 1245   AST 18 09/17/2019 0800   AST 21 07/14/2017 1245   ALT 15 09/17/2019 0800   ALT 21 07/14/2017 1245   ALKPHOS 72 09/17/2019 0800   ALKPHOS 78 07/14/2017 1245   BILITOT 0.5 09/17/2019 0800   BILITOT 0.43 07/14/2017 1245   GFRNONAA >60 09/17/2019 0800   GFRAA >60 09/17/2019 0800    No results found for: SPEP, UPEP  Lab Results  Component Value Date   WBC 4.8 09/17/2019   NEUTROABS 2.3 09/17/2019   HGB 11.6 (L) 09/17/2019   HCT 34.6 (L) 09/17/2019   MCV 90.6 09/17/2019   PLT 267 09/17/2019      Chemistry      Component Value Date/Time   NA 140 09/17/2019 0800   NA 143 07/14/2017 1245   K 4.3 09/17/2019 0800   K 3.3 (L) 07/14/2017 1245   CL 107 09/17/2019 0800   CO2 23 09/17/2019 0800   CO2 20 (L) 07/14/2017 1245   BUN 8 09/17/2019 0800   BUN 9.3 07/14/2017 1245   CREATININE 0.96 09/17/2019 0800   CREATININE 0.8 07/14/2017 1245      Component Value Date/Time   CALCIUM 8.9 09/17/2019 0800   CALCIUM 8.4 07/14/2017 1245   ALKPHOS 72 09/17/2019 0800   ALKPHOS 78 07/14/2017 1245   AST 18 09/17/2019 0800   AST 21 07/14/2017 1245   ALT 15 09/17/2019 0800   ALT 21 07/14/2017 1245   BILITOT 0.5 09/17/2019 0800   BILITOT 0.43 07/14/2017 1245

## 2019-09-17 NOTE — Assessment & Plan Note (Signed)
Her blood pressure control is satisfactory I will defer to her primary care doctor for management

## 2019-09-17 NOTE — Assessment & Plan Note (Signed)
I have reviewed recent myeloma panel with the patient She has achieved maximum response to therapy She is still recovering from peripheral neuropathy, since discontinuation of Velcade I recommend continue on Faspro only We will continue myeloma panel once a month She agreed with the plan of care She will continue Zometa every 3 months along with calcium and vitamin D supplement

## 2019-09-17 NOTE — Assessment & Plan Note (Signed)
She is developing mild worsening neuropathy due to recent Velcade, likely exacerbated by cold weather Observe closely for now

## 2019-09-19 LAB — MULTIPLE MYELOMA PANEL, SERUM
Albumin SerPl Elph-Mcnc: 3.7 g/dL (ref 2.9–4.4)
Albumin/Glob SerPl: 1.6 (ref 0.7–1.7)
Alpha 1: 0.2 g/dL (ref 0.0–0.4)
Alpha2 Glob SerPl Elph-Mcnc: 0.9 g/dL (ref 0.4–1.0)
B-Globulin SerPl Elph-Mcnc: 1 g/dL (ref 0.7–1.3)
Gamma Glob SerPl Elph-Mcnc: 0.4 g/dL (ref 0.4–1.8)
Globulin, Total: 2.4 g/dL (ref 2.2–3.9)
IgA: <5 mg/dL — ABNORMAL LOW (ref 87–352)
IgG (Immunoglobin G), Serum: 359 mg/dL — ABNORMAL LOW (ref 586–1602)
IgM (Immunoglobulin M), Srm: <5 mg/dL — ABNORMAL LOW (ref 26–217)
M Protein SerPl Elph-Mcnc: 0.2 g/dL — ABNORMAL HIGH
Total Protein ELP: 6.1 g/dL (ref 6.0–8.5)

## 2019-09-19 LAB — KAPPA/LAMBDA LIGHT CHAINS
Kappa free light chain: 3.9 mg/L (ref 3.3–19.4)
Kappa, lambda light chain ratio: 2.6 — ABNORMAL HIGH (ref 0.26–1.65)
Lambda free light chains: 1.5 mg/L — ABNORMAL LOW (ref 5.7–26.3)

## 2019-09-20 ENCOUNTER — Other Ambulatory Visit: Payer: Self-pay | Admitting: Hematology and Oncology

## 2019-09-21 ENCOUNTER — Telehealth: Payer: Self-pay | Admitting: Hematology and Oncology

## 2019-09-21 NOTE — Telephone Encounter (Signed)
Scheduled per 2/8 sch msg. Called and spoke with pt, confirmed 3/8 appt

## 2019-10-01 ENCOUNTER — Other Ambulatory Visit: Payer: Medicare Other

## 2019-10-01 ENCOUNTER — Inpatient Hospital Stay: Payer: Medicare Other

## 2019-10-01 ENCOUNTER — Other Ambulatory Visit: Payer: Self-pay

## 2019-10-01 VITALS — BP 134/89 | HR 75 | Temp 98.3°F | Resp 16

## 2019-10-01 DIAGNOSIS — Z5112 Encounter for antineoplastic immunotherapy: Secondary | ICD-10-CM | POA: Diagnosis not present

## 2019-10-01 DIAGNOSIS — Z7189 Other specified counseling: Secondary | ICD-10-CM

## 2019-10-01 DIAGNOSIS — C9001 Multiple myeloma in remission: Secondary | ICD-10-CM

## 2019-10-01 DIAGNOSIS — Z9481 Bone marrow transplant status: Secondary | ICD-10-CM

## 2019-10-01 LAB — CBC WITH DIFFERENTIAL (CANCER CENTER ONLY)
Abs Immature Granulocytes: 0.01 10*3/uL (ref 0.00–0.07)
Basophils Absolute: 0 10*3/uL (ref 0.0–0.1)
Basophils Relative: 1 %
Eosinophils Absolute: 0.1 10*3/uL (ref 0.0–0.5)
Eosinophils Relative: 2 %
HCT: 34.5 % — ABNORMAL LOW (ref 36.0–46.0)
Hemoglobin: 11.4 g/dL — ABNORMAL LOW (ref 12.0–15.0)
Immature Granulocytes: 0 %
Lymphocytes Relative: 31 %
Lymphs Abs: 2 10*3/uL (ref 0.7–4.0)
MCH: 30.1 pg (ref 26.0–34.0)
MCHC: 33 g/dL (ref 30.0–36.0)
MCV: 91 fL (ref 80.0–100.0)
Monocytes Absolute: 0.6 10*3/uL (ref 0.1–1.0)
Monocytes Relative: 9 %
Neutro Abs: 3.8 10*3/uL (ref 1.7–7.7)
Neutrophils Relative %: 57 %
Platelet Count: 276 10*3/uL (ref 150–400)
RBC: 3.79 MIL/uL — ABNORMAL LOW (ref 3.87–5.11)
RDW: 12.4 % (ref 11.5–15.5)
WBC Count: 6.6 10*3/uL (ref 4.0–10.5)
nRBC: 0 % (ref 0.0–0.2)

## 2019-10-01 LAB — CMP (CANCER CENTER ONLY)
ALT: 14 U/L (ref 0–44)
AST: 20 U/L (ref 15–41)
Albumin: 4 g/dL (ref 3.5–5.0)
Alkaline Phosphatase: 60 U/L (ref 38–126)
Anion gap: 11 (ref 5–15)
BUN: 10 mg/dL (ref 8–23)
CO2: 25 mmol/L (ref 22–32)
Calcium: 8.6 mg/dL — ABNORMAL LOW (ref 8.9–10.3)
Chloride: 109 mmol/L (ref 98–111)
Creatinine: 0.96 mg/dL (ref 0.44–1.00)
GFR, Est AFR Am: >60 mL/min (ref >60)
GFR, Estimated: >60 mL/min (ref >60)
Glucose, Bld: 87 mg/dL (ref 70–99)
Potassium: 3.7 mmol/L (ref 3.5–5.1)
Sodium: 145 mmol/L (ref 135–145)
Total Bilirubin: 0.5 mg/dL (ref 0.3–1.2)
Total Protein: 6.5 g/dL (ref 6.5–8.1)

## 2019-10-01 MED ORDER — ACETAMINOPHEN 325 MG PO TABS
ORAL_TABLET | ORAL | Status: AC
  Filled 2019-10-01: qty 2

## 2019-10-01 MED ORDER — DEXAMETHASONE 4 MG PO TABS
10.0000 mg | ORAL_TABLET | Freq: Once | ORAL | Status: AC
  Administered 2019-10-01: 11:00:00 10 mg via ORAL

## 2019-10-01 MED ORDER — DIPHENHYDRAMINE HCL 25 MG PO CAPS
ORAL_CAPSULE | ORAL | Status: AC
  Filled 2019-10-01: qty 1

## 2019-10-01 MED ORDER — ACETAMINOPHEN 325 MG PO TABS
650.0000 mg | ORAL_TABLET | Freq: Once | ORAL | Status: AC
  Administered 2019-10-01: 650 mg via ORAL

## 2019-10-01 MED ORDER — DIPHENHYDRAMINE HCL 25 MG PO CAPS
25.0000 mg | ORAL_CAPSULE | Freq: Once | ORAL | Status: AC
  Administered 2019-10-01: 25 mg via ORAL

## 2019-10-01 MED ORDER — MONTELUKAST SODIUM 10 MG PO TABS
ORAL_TABLET | ORAL | Status: AC
  Filled 2019-10-01: qty 1

## 2019-10-01 MED ORDER — DARATUMUMAB-HYALURONIDASE-FIHJ 1800-30000 MG-UT/15ML ~~LOC~~ SOLN
1800.0000 mg | Freq: Once | SUBCUTANEOUS | Status: AC
  Administered 2019-10-01: 1800 mg via SUBCUTANEOUS
  Filled 2019-10-01: qty 15

## 2019-10-01 MED ORDER — MONTELUKAST SODIUM 10 MG PO TABS
10.0000 mg | ORAL_TABLET | Freq: Once | ORAL | Status: AC
  Administered 2019-10-01: 10 mg via ORAL

## 2019-10-01 MED ORDER — DEXAMETHASONE 4 MG PO TABS
ORAL_TABLET | ORAL | Status: AC
  Filled 2019-10-01: qty 3

## 2019-10-01 NOTE — Patient Instructions (Signed)
[facility] Discharge Instructions for Patients Receiving Chemotherapy  Today you received the following Immunotherapy Agent:  Daratumumab Hyaluronidase  To help prevent nausea and vomiting after your treatment, we encourage you to take your nausea medication as directed by your MD   If you develop nausea and vomiting that is not controlled by your nausea medication, call the clinic.   BELOW ARE SYMPTOMS THAT SHOULD BE REPORTED IMMEDIATELY:  *FEVER GREATER THAN 100.5 F  *CHILLS WITH OR WITHOUT FEVER  NAUSEA AND VOMITING THAT IS NOT CONTROLLED WITH YOUR NAUSEA MEDICATION  *UNUSUAL SHORTNESS OF BREATH  *UNUSUAL BRUISING OR BLEEDING  TENDERNESS IN MOUTH AND THROAT WITH OR WITHOUT PRESENCE OF ULCERS  *URINARY PROBLEMS  *BOWEL PROBLEMS  UNUSUAL RASH Items with * indicate a potential emergency and should be followed up as soon as possible.  Feel free to call the clinic should you have any questions or concerns. The clinic phone number is [phone].  Please show the [facility] at check-in to the Emergency Department and triage nurse.  Coronavirus (COVID-19) Are you at risk?  Are you at risk for the Coronavirus (COVID-19)?  To be considered HIGH RISK for Coronavirus (COVID-19), you have to meet the following criteria:  . Traveled to [place], [place], [place], [place] or [place]; or in the [place] to [place], [place], [place], or [place]; and have fever, cough, and shortness of breath within the last 2 weeks of travel OR . Been in close contact with a person diagnosed with COVID-19 within the last 2 weeks and have fever, cough, and shortness of breath . IF YOU DO NOT MEET THESE CRITERIA, YOU ARE CONSIDERED LOW RISK FOR COVID-19.  What to do if you are HIGH RISK for COVID-19?  Marland Kitchen If you are having a medical emergency, call 911. . Seek medical care right away. Before you go to a doctor's office, urgent care or emergency department, call ahead  and tell them about your recent travel, contact with someone diagnosed with COVID-19, and your symptoms. You should receive instructions from your physician's office regarding next steps of care.  . When you arrive at healthcare provider, tell the healthcare staff immediately you have returned from visiting [place], [place], [place], [place] or [place]; or traveled in the [place] to [place], [place], [place], or [place]; in the last two weeks or you have been in close contact with a person diagnosed with COVID-19 in the last 2 weeks.   . Tell the health care staff about your symptoms: fever, cough and shortness of breath. . After you have been seen by a medical provider, you will be either: o Tested for (COVID-19) and discharged home on quarantine except to seek medical care if symptoms worsen, and asked to  - Stay home and avoid contact with others until you get your results (4-5 days)  - Avoid travel on public transportation if possible (such as bus, train, or airplane) or o Sent to the Emergency Department by EMS for evaluation, COVID-19 testing, and possible admission depending on your condition and test results.  What to do if you are LOW RISK for COVID-19?  Reduce your risk of any infection by using the same precautions used for avoiding the common cold or flu:  Marland Kitchen Wash your hands often with soap and warm water for at least 20 seconds.  If soap and water are not readily available, use an alcohol-based hand sanitizer with at least 60% alcohol.  Marland Kitchen  If coughing or sneezing, cover your mouth and nose by coughing or sneezing into the elbow areas of your shirt or coat, into a tissue or into your sleeve (not your hands). . Avoid shaking hands with others and consider head nods or verbal greetings only. . Avoid touching your eyes, nose, or mouth with unwashed hands.  . Avoid close contact with people who are sick. . Avoid places or events with large numbers of people in one location, like  concerts or sporting events. . Carefully consider travel plans you have or are making. . If you are planning any travel outside or inside the [place], visit the CDC's Travelers' Health webpage for the latest health notices. . If you have some symptoms but not all symptoms, continue to monitor at home and seek medical attention if your symptoms worsen. . If you are having a medical emergency, call 911.   ADDITIONAL HEALTHCARE OPTIONS FOR PATIENTS  [facility] Tele[facility] / e-Visit: [email or web address]         [facility]: [phone]  [facility]: [phone]                   [facility]: [phone]  

## 2019-10-15 ENCOUNTER — Inpatient Hospital Stay (HOSPITAL_BASED_OUTPATIENT_CLINIC_OR_DEPARTMENT_OTHER): Payer: Medicare Other | Admitting: Hematology and Oncology

## 2019-10-15 ENCOUNTER — Other Ambulatory Visit: Payer: Self-pay

## 2019-10-15 ENCOUNTER — Encounter: Payer: Self-pay | Admitting: Hematology and Oncology

## 2019-10-15 ENCOUNTER — Telehealth: Payer: Self-pay | Admitting: Hematology and Oncology

## 2019-10-15 ENCOUNTER — Inpatient Hospital Stay: Payer: Medicare Other | Attending: Hematology and Oncology

## 2019-10-15 ENCOUNTER — Inpatient Hospital Stay: Payer: Medicare Other

## 2019-10-15 VITALS — BP 146/91 | HR 75 | Temp 98.8°F | Resp 18

## 2019-10-15 DIAGNOSIS — Z79899 Other long term (current) drug therapy: Secondary | ICD-10-CM | POA: Diagnosis not present

## 2019-10-15 DIAGNOSIS — Z9221 Personal history of antineoplastic chemotherapy: Secondary | ICD-10-CM | POA: Diagnosis not present

## 2019-10-15 DIAGNOSIS — Z885 Allergy status to narcotic agent status: Secondary | ICD-10-CM | POA: Diagnosis not present

## 2019-10-15 DIAGNOSIS — D6481 Anemia due to antineoplastic chemotherapy: Secondary | ICD-10-CM | POA: Diagnosis not present

## 2019-10-15 DIAGNOSIS — Z9484 Stem cells transplant status: Secondary | ICD-10-CM | POA: Insufficient documentation

## 2019-10-15 DIAGNOSIS — Z880 Allergy status to penicillin: Secondary | ICD-10-CM | POA: Diagnosis not present

## 2019-10-15 DIAGNOSIS — Z923 Personal history of irradiation: Secondary | ICD-10-CM | POA: Insufficient documentation

## 2019-10-15 DIAGNOSIS — R911 Solitary pulmonary nodule: Secondary | ICD-10-CM | POA: Diagnosis not present

## 2019-10-15 DIAGNOSIS — C9001 Multiple myeloma in remission: Secondary | ICD-10-CM | POA: Diagnosis not present

## 2019-10-15 DIAGNOSIS — Z5112 Encounter for antineoplastic immunotherapy: Secondary | ICD-10-CM | POA: Diagnosis not present

## 2019-10-15 DIAGNOSIS — D638 Anemia in other chronic diseases classified elsewhere: Secondary | ICD-10-CM | POA: Diagnosis not present

## 2019-10-15 DIAGNOSIS — M545 Low back pain: Secondary | ICD-10-CM | POA: Diagnosis not present

## 2019-10-15 DIAGNOSIS — Z881 Allergy status to other antibiotic agents status: Secondary | ICD-10-CM | POA: Insufficient documentation

## 2019-10-15 DIAGNOSIS — Z886 Allergy status to analgesic agent status: Secondary | ICD-10-CM | POA: Insufficient documentation

## 2019-10-15 DIAGNOSIS — Z7189 Other specified counseling: Secondary | ICD-10-CM

## 2019-10-15 DIAGNOSIS — Z9481 Bone marrow transplant status: Secondary | ICD-10-CM

## 2019-10-15 LAB — CBC WITH DIFFERENTIAL (CANCER CENTER ONLY)
Abs Immature Granulocytes: 0.02 10*3/uL (ref 0.00–0.07)
Basophils Absolute: 0 10*3/uL (ref 0.0–0.1)
Basophils Relative: 1 %
Eosinophils Absolute: 0.1 10*3/uL (ref 0.0–0.5)
Eosinophils Relative: 2 %
HCT: 35.5 % — ABNORMAL LOW (ref 36.0–46.0)
Hemoglobin: 11.6 g/dL — ABNORMAL LOW (ref 12.0–15.0)
Immature Granulocytes: 0 %
Lymphocytes Relative: 40 %
Lymphs Abs: 2.1 10*3/uL (ref 0.7–4.0)
MCH: 29.2 pg (ref 26.0–34.0)
MCHC: 32.7 g/dL (ref 30.0–36.0)
MCV: 89.4 fL (ref 80.0–100.0)
Monocytes Absolute: 0.5 10*3/uL (ref 0.1–1.0)
Monocytes Relative: 9 %
Neutro Abs: 2.5 10*3/uL (ref 1.7–7.7)
Neutrophils Relative %: 48 %
Platelet Count: 293 10*3/uL (ref 150–400)
RBC: 3.97 MIL/uL (ref 3.87–5.11)
RDW: 12.3 % (ref 11.5–15.5)
WBC Count: 5.2 10*3/uL (ref 4.0–10.5)
nRBC: 0 % (ref 0.0–0.2)

## 2019-10-15 LAB — CMP (CANCER CENTER ONLY)
ALT: 13 U/L (ref 0–44)
AST: 18 U/L (ref 15–41)
Albumin: 4.1 g/dL (ref 3.5–5.0)
Alkaline Phosphatase: 58 U/L (ref 38–126)
Anion gap: 11 (ref 5–15)
BUN: 10 mg/dL (ref 8–23)
CO2: 24 mmol/L (ref 22–32)
Calcium: 8.9 mg/dL (ref 8.9–10.3)
Chloride: 106 mmol/L (ref 98–111)
Creatinine: 1.11 mg/dL — ABNORMAL HIGH (ref 0.44–1.00)
GFR, Est AFR Am: 59 mL/min — ABNORMAL LOW (ref >60)
GFR, Estimated: 51 mL/min — ABNORMAL LOW (ref >60)
Glucose, Bld: 83 mg/dL (ref 70–99)
Potassium: 3.8 mmol/L (ref 3.5–5.1)
Sodium: 141 mmol/L (ref 135–145)
Total Bilirubin: 0.4 mg/dL (ref 0.3–1.2)
Total Protein: 6.6 g/dL (ref 6.5–8.1)

## 2019-10-15 MED ORDER — DARATUMUMAB-HYALURONIDASE-FIHJ 1800-30000 MG-UT/15ML ~~LOC~~ SOLN
1800.0000 mg | Freq: Once | SUBCUTANEOUS | Status: AC
  Administered 2019-10-15: 1800 mg via SUBCUTANEOUS
  Filled 2019-10-15: qty 15

## 2019-10-15 MED ORDER — DIPHENHYDRAMINE HCL 25 MG PO CAPS
ORAL_CAPSULE | ORAL | Status: AC
  Filled 2019-10-15: qty 1

## 2019-10-15 MED ORDER — MONTELUKAST SODIUM 10 MG PO TABS
10.0000 mg | ORAL_TABLET | Freq: Once | ORAL | Status: AC
  Administered 2019-10-15: 10 mg via ORAL

## 2019-10-15 MED ORDER — ACETAMINOPHEN 325 MG PO TABS
650.0000 mg | ORAL_TABLET | Freq: Once | ORAL | Status: AC
  Administered 2019-10-15: 650 mg via ORAL

## 2019-10-15 MED ORDER — ACETAMINOPHEN 325 MG PO TABS
ORAL_TABLET | ORAL | Status: AC
  Filled 2019-10-15: qty 2

## 2019-10-15 MED ORDER — MONTELUKAST SODIUM 10 MG PO TABS
ORAL_TABLET | ORAL | Status: AC
  Filled 2019-10-15: qty 1

## 2019-10-15 MED ORDER — DIPHENHYDRAMINE HCL 25 MG PO CAPS
25.0000 mg | ORAL_CAPSULE | Freq: Once | ORAL | Status: AC
  Administered 2019-10-15: 25 mg via ORAL

## 2019-10-15 MED ORDER — DEXAMETHASONE 4 MG PO TABS
12.0000 mg | ORAL_TABLET | Freq: Once | ORAL | Status: AC
  Administered 2019-10-15: 12 mg via ORAL

## 2019-10-15 MED ORDER — DEXAMETHASONE 4 MG PO TABS
ORAL_TABLET | ORAL | Status: AC
  Filled 2019-10-15: qty 3

## 2019-10-15 NOTE — Telephone Encounter (Signed)
Scheduled appt per 3/8 sch message - unable to reach pt . Left message with app date and time on mobile

## 2019-10-15 NOTE — Assessment & Plan Note (Signed)
I have reviewed recent myeloma panel with the patient She has achieved maximum response to therapy I recommend continue on Faspro only We will continue myeloma panel once a month She agreed with the plan of care She will continue Zometa every 3 months along with calcium and vitamin D supplement

## 2019-10-15 NOTE — Assessment & Plan Note (Signed)
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  Observe  

## 2019-10-15 NOTE — Patient Instructions (Signed)
[facility] Discharge Instructions for Patients Receiving Chemotherapy  Today you received the following Immunotherapy Agent:  Daratumumab Hyaluronidase  To help prevent nausea and vomiting after your treatment, we encourage you to take your nausea medication as directed by your MD   If you develop nausea and vomiting that is not controlled by your nausea medication, call the clinic.   BELOW ARE SYMPTOMS THAT SHOULD BE REPORTED IMMEDIATELY:  *FEVER GREATER THAN 100.5 F  *CHILLS WITH OR WITHOUT FEVER  NAUSEA AND VOMITING THAT IS NOT CONTROLLED WITH YOUR NAUSEA MEDICATION  *UNUSUAL SHORTNESS OF BREATH  *UNUSUAL BRUISING OR BLEEDING  TENDERNESS IN MOUTH AND THROAT WITH OR WITHOUT PRESENCE OF ULCERS  *URINARY PROBLEMS  *BOWEL PROBLEMS  UNUSUAL RASH Items with * indicate a potential emergency and should be followed up as soon as possible.  Feel free to call the clinic should you have any questions or concerns. The clinic phone number is [phone].  Please show the [facility] at check-in to the Emergency Department and triage nurse.  Coronavirus (COVID-19) Are you at risk?  Are you at risk for the Coronavirus (COVID-19)?  To be considered HIGH RISK for Coronavirus (COVID-19), you have to meet the following criteria:  . Traveled to [place], [place], [place], [place] or [place]; or in the [place] to [place], [place], [place], or [place]; and have fever, cough, and shortness of breath within the last 2 weeks of travel OR . Been in close contact with a person diagnosed with COVID-19 within the last 2 weeks and have fever, cough, and shortness of breath . IF YOU DO NOT MEET THESE CRITERIA, YOU ARE CONSIDERED LOW RISK FOR COVID-19.  What to do if you are HIGH RISK for COVID-19?  Marland Kitchen If you are having a medical emergency, call 911. . Seek medical care right away. Before you go to a doctor's office, urgent care or emergency department, call ahead  and tell them about your recent travel, contact with someone diagnosed with COVID-19, and your symptoms. You should receive instructions from your physician's office regarding next steps of care.  . When you arrive at healthcare provider, tell the healthcare staff immediately you have returned from visiting [place], [place], [place], [place] or [place]; or traveled in the [place] to [place], [place], [place], or [place]; in the last two weeks or you have been in close contact with a person diagnosed with COVID-19 in the last 2 weeks.   . Tell the health care staff about your symptoms: fever, cough and shortness of breath. . After you have been seen by a medical provider, you will be either: o Tested for (COVID-19) and discharged home on quarantine except to seek medical care if symptoms worsen, and asked to  - Stay home and avoid contact with others until you get your results (4-5 days)  - Avoid travel on public transportation if possible (such as bus, train, or airplane) or o Sent to the Emergency Department by EMS for evaluation, COVID-19 testing, and possible admission depending on your condition and test results.  What to do if you are LOW RISK for COVID-19?  Reduce your risk of any infection by using the same precautions used for avoiding the common cold or flu:  Marland Kitchen Wash your hands often with soap and warm water for at least 20 seconds.  If soap and water are not readily available, use an alcohol-based hand sanitizer with at least 60% alcohol.  Marland Kitchen  If coughing or sneezing, cover your mouth and nose by coughing or sneezing into the elbow areas of your shirt or coat, into a tissue or into your sleeve (not your hands). . Avoid shaking hands with others and consider head nods or verbal greetings only. . Avoid touching your eyes, nose, or mouth with unwashed hands.  . Avoid close contact with people who are sick. . Avoid places or events with large numbers of people in one location, like  concerts or sporting events. . Carefully consider travel plans you have or are making. . If you are planning any travel outside or inside the US, visit the CDC's Travelers' Health webpage for the latest health notices. . If you have some symptoms but not all symptoms, continue to monitor at home and seek medical attention if your symptoms worsen. . If you are having a medical emergency, call 911.   ADDITIONAL HEALTHCARE OPTIONS FOR PATIENTS  [facility] / e-Visit: [email or web address]         [facility]: [phone]  [facility]: [phone]                   [facility]: [phone]  

## 2019-10-15 NOTE — Assessment & Plan Note (Signed)
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

## 2019-10-15 NOTE — Progress Notes (Signed)
[place] OFFICE PROGRESS NOTE  Patient Care Team: Josetta Huddle, MD as PCP - General (Internal Medicine) Ginette Pitman, MD as Consulting Physician (Hematology and Oncology) Hessie Dibble, MD as Consulting Physician (Hematology and Oncology)  ASSESSMENT & PLAN:  Multiple myeloma in remission [facility]) I have reviewed recent myeloma panel with the patient She has achieved maximum response to therapy I recommend continue on Faspro only We will continue myeloma panel once a month She agreed with the plan of care She will continue Zometa every 3 months along with calcium and vitamin D supplement  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  Observe  Hypocalcemia She has occasional lower back pain that comes and goes Her PET scan from September was unremarkable She has intermittent hypocalcemia I recommend she increase calcium supplement We will check her bone density again in September for further evaluation I suspect her occasional back pain is due to degeneration with arthritis   No orders of the defined types were placed in this encounter.   All questions were answered. The patient knows to call the clinic with any problems, questions or concerns. The total time spent in the appointment was 20 minutes encounter with patients including review of chart and various tests results, discussions about plan of care and coordination of care plan   [name], MD 10/15/2019 10:01 AM  INTERVAL HISTORY: Please see below for problem oriented charting. She returns for further follow-up She is doing well She have occasional intermittent lower back pain No recent falls No infusion reaction from treatment Overall, she feels well She is interested to schedule Covid vaccination and I encouraged her to do so  SUMMARY OF ONCOLOGIC HISTORY: Oncology History Overview Note    M-protein 0.69 gm/dl IFIX - IgG, Kappa IgG - 868 IgA - 19 IgM - < 20 Kappa - 21 Lambda - 5.7  09/06/2014 - Bone marrow aspirate and biopsy:   Normocellular marrow for age (40%) with a small monoclonal plasma cell population (1% on aspirate). Karyotype 57, XX  FISH Negative for myeloma associated changes  09/12/2014 - PET/CT  Two regions that are concerning for disease, one adjacent/involving the left ninth rib and one in the marrow of the right femur, in this patient with history of plasmacytoma.    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   04/01/2015 Bone Marrow Transplant  She received melphalan chemotherapy on 03/31/2015 followed by autologous stem cell transplant the day after   04/03/2015 - 04/18/2015 Hospital Admission   The patient was admitted to the hospital at [place] for management related to complication from stem cell transplant. She had significant nausea requiring intravenous anti-emetics.   07/17/2015 -  Chemotherapy   She  started maintenance Revlimid and monthly zometa, then every 3 months   06/01/2018 Imaging   DEXA scan showed bone density T score in femur -2.3   04/20/2019 PET scan   1. No FDG avid osseous lesions or mass identified to suggest metabolically active lesion of myeloma or plasmacytoma. 2. Small nodular density within the paravertebral right lower lobe exhibits mild to moderate increased uptake within SUV max of 3.38. This is indeterminate. Review of CT chest from 10/05/2018 shows a corresponding Lung nodule in this area measuring the same. Small pulmonary neoplasm cannot be excluded. 3. Indeterminate, focal area of increased uptake is identified within the thoracic canal. Indeterminate favored to represent benign physiologic CNS activity.     04/30/2019 -  Chemotherapy   The patient had daratumumab-hyaluronidase-fihj (DARZALEX FASPRO) 1800-30000 MG-UT/15ML chemo SQ injection 1,800 mg, 1,800 mg, Subcutaneous,  Once, 7 of 9 cycles Administration: 1,800 mg (04/30/2019), 1,800 mg (05/07/2019), 1,800 mg (05/14/2019), 1,800 mg (05/21/2019), 1,800 mg (05/28/2019), 1,800 mg (06/04/2019), 1,800 mg (06/11/2019), 1,800 mg (06/18/2019), 1,800 mg (06/25/2019), 1,800 mg (07/09/2019), 1,800 mg (07/23/2019), 1,800 mg (08/06/2019), 1,800 mg (08/20/2019), 1,800 mg (09/03/2019), 1,800 mg (09/17/2019), 1,800 mg (10/01/2019)  for chemotherapy treatment.    04/30/2019 - 07/22/2019 Chemotherapy   The patient had bortezomib SQ (VELCADE) chemo injection 1.75 mg, 1.3 mg/m2 = 1.75 mg, Subcutaneous,  Once, 10 of 11 cycles Administration: 1.75 mg (04/30/2019), 1.75 mg (05/07/2019), 1.75 mg (05/14/2019), 1.75 mg (05/21/2019), 1.75 mg (05/28/2019), 1.75 mg (06/04/2019), 1.75 mg (06/11/2019), 1.75 mg (06/18/2019), 1.75 mg (06/25/2019), 1.75 mg (07/09/2019)  for chemotherapy treatment.      REVIEW OF SYSTEMS:   Constitutional: Denies fevers, chills or abnormal weight loss Eyes: Denies blurriness of vision Ears, nose, mouth, throat, and face:  Denies mucositis or sore throat Respiratory: Denies cough, dyspnea or wheezes Cardiovascular: Denies palpitation, chest discomfort or lower extremity swelling Gastrointestinal:  Denies nausea, heartburn or change in bowel habits Skin: Denies abnormal skin rashes Lymphatics: Denies new lymphadenopathy or easy bruising Neurological:Denies numbness, tingling or new weaknesses Behavioral/Psych: Mood is stable, no new changes  All other systems were reviewed with the patient and are negative.  I have reviewed the past medical history, past surgical history, social history and family history with the patient and they are unchanged from previous note.  ALLERGIES:  is allergic to augmentin [amoxicillin-pot clavulanate]; albuterol; codeine; ibuprofen; nsaids; and tolmetin.  MEDICATIONS:  Current Outpatient Medications  Medication Sig Dispense Refill  . acetaminophen (TYLENOL) 500 MG tablet Take 500-1,000 mg by mouth every 6 (six) hours as needed for mild pain, moderate pain, fever or headache.    Marland Kitchen acyclovir (ZOVIRAX) 400 MG tablet Take 1 tablet (400 mg total) by mouth 2 (two) times daily. 180 tablet 3  . aspirin EC 81 MG tablet Take 81 mg by mouth daily with breakfast.    . calcium carbonate (OSCAL) 1500 (600 Ca) MG TABS tablet Take 1,500 mg by mouth daily with breakfast.    . Calcium Carbonate 500 MG CHEW Chew 500 mg by mouth as needed for indigestion.    . Cholecalciferol (VITAMIN D3) 2000 units capsule Take 2,000 Units by mouth daily.     Marland Kitchen  Multiple Vitamins-Minerals (CENTRUM SILVER PO) Take 1 tablet by mouth daily.     Marland Kitchen omeprazole (PRILOSEC) 20 MG capsule Take 1 capsule (20 mg total) by mouth daily. 90 capsule 11  . ondansetron (ZOFRAN) 8 MG tablet TAKE 1 TABLET(8 MG) BY MOUTH EVERY 8 HOURS AS NEEDED FOR NAUSEA OR VOMITING 60 tablet 1  . senna-docusate (SENOKOT-S) 8.6-50 MG tablet Take 1-2 tablets by mouth daily as needed for mild constipation or moderate constipation.     . sodium chloride  (OCEAN) 0.65 % SOLN nasal spray Place 1 spray into both nostrils as needed for congestion.     No current facility-administered medications for this visit.   Facility-Administered Medications Ordered in Other Visits  Medication Dose Route Frequency Provider Last Rate Last Admin  . 0.9 %  sodium chloride infusion   Intravenous Once Aloma Boch, MD      . daratumumab-hyaluronidase-fihj (DARZALEX FASPRO) 1800-30000 MG-UT/15ML chemo SQ injection 1,800 mg  1,800 mg Subcutaneous Once Alvy Bimler, Hadlyn Amero, MD        PHYSICAL EXAMINATION: ECOG PERFORMANCE STATUS: 1 - Symptomatic but completely ambulatory  Vitals:   10/15/19 0909  BP: (!) 141/94  Pulse: 87  Resp: 18  Temp: 98.2 F (36.8 C)  SpO2: 100%   Filed Weights   10/15/19 0909  Weight: 116 lb 3.2 oz (52.7 kg)    GENERAL:alert, no distress and comfortable NEURO: alert & oriented x 3 with fluent speech, no focal motor/sensory deficits  LABORATORY DATA:  I have reviewed the data as listed    Component Value Date/Time   NA 141 10/15/2019 0857   NA 143 07/14/2017 1245   K 3.8 10/15/2019 0857   K 3.3 (L) 07/14/2017 1245   CL 106 10/15/2019 0857   CO2 24 10/15/2019 0857   CO2 20 (L) 07/14/2017 1245   GLUCOSE 83 10/15/2019 0857   GLUCOSE 106 07/14/2017 1245   BUN 10 10/15/2019 0857   BUN 9.3 07/14/2017 1245   CREATININE 1.11 (H) 10/15/2019 0857   CREATININE 0.8 07/14/2017 1245   CALCIUM 8.9 10/15/2019 0857   CALCIUM 8.4 07/14/2017 1245   PROT 6.6 10/15/2019 0857   PROT 6.0 07/14/2017 1245   PROT 6.2 (L) 07/14/2017 1245   ALBUMIN 4.1 10/15/2019 0857   ALBUMIN 3.6 07/14/2017 1245   AST 18 10/15/2019 0857   AST 21 07/14/2017 1245   ALT 13 10/15/2019 0857   ALT 21 07/14/2017 1245   ALKPHOS 58 10/15/2019 0857   ALKPHOS 78 07/14/2017 1245   BILITOT 0.4 10/15/2019 0857   BILITOT 0.43 07/14/2017 1245   GFRNONAA 51 (L) 10/15/2019 0857   GFRAA 59 (L) 10/15/2019 0857    No results found for: SPEP, UPEP  Lab Results  Component  Value Date   WBC 5.2 10/15/2019   NEUTROABS 2.5 10/15/2019   HGB 11.6 (L) 10/15/2019   HCT 35.5 (L) 10/15/2019   MCV 89.4 10/15/2019   PLT 293 10/15/2019      Chemistry      Component Value Date/Time   NA 141 10/15/2019 0857   NA 143 07/14/2017 1245   K 3.8 10/15/2019 0857   K 3.3 (L) 07/14/2017 1245   CL 106 10/15/2019 0857   CO2 24 10/15/2019 0857   CO2 20 (L) 07/14/2017 1245   BUN 10 10/15/2019 0857   BUN 9.3 07/14/2017 1245   CREATININE 1.11 (H) 10/15/2019 0857   CREATININE 0.8 07/14/2017 1245      Component Value Date/Time   CALCIUM 8.9 10/15/2019 0857  CALCIUM 8.4 07/14/2017 1245   ALKPHOS 58 10/15/2019 0857   ALKPHOS 78 07/14/2017 1245   AST 18 10/15/2019 0857   AST 21 07/14/2017 1245   ALT 13 10/15/2019 0857   ALT 21 07/14/2017 1245   BILITOT 0.4 10/15/2019 0857   BILITOT 0.43 07/14/2017 1245     I reviewed her imaging study in September

## 2019-10-16 LAB — KAPPA/LAMBDA LIGHT CHAINS
Kappa free light chain: 5.7 mg/L (ref 3.3–19.4)
Kappa, lambda light chain ratio: 3 — ABNORMAL HIGH (ref 0.26–1.65)
Lambda free light chains: 1.9 mg/L — ABNORMAL LOW (ref 5.7–26.3)

## 2019-10-17 LAB — MULTIPLE MYELOMA PANEL, SERUM
Albumin SerPl Elph-Mcnc: 3.7 g/dL (ref 2.9–4.4)
Albumin/Glob SerPl: 1.5 (ref 0.7–1.7)
Alpha 1: 0.2 g/dL (ref 0.0–0.4)
Alpha2 Glob SerPl Elph-Mcnc: 0.9 g/dL (ref 0.4–1.0)
B-Globulin SerPl Elph-Mcnc: 1.1 g/dL (ref 0.7–1.3)
Gamma Glob SerPl Elph-Mcnc: 0.4 g/dL (ref 0.4–1.8)
Globulin, Total: 2.5 g/dL (ref 2.2–3.9)
IgA: <5 mg/dL — ABNORMAL LOW (ref 87–352)
IgG (Immunoglobin G), Serum: 337 mg/dL — ABNORMAL LOW (ref 586–1602)
IgM (Immunoglobulin M), Srm: <5 mg/dL — ABNORMAL LOW (ref 26–217)
M Protein SerPl Elph-Mcnc: 0.2 g/dL — ABNORMAL HIGH
Total Protein ELP: 6.2 g/dL (ref 6.0–8.5)

## 2019-11-12 ENCOUNTER — Inpatient Hospital Stay: Payer: Medicare Other | Attending: Hematology and Oncology | Admitting: Hematology and Oncology

## 2019-11-12 ENCOUNTER — Inpatient Hospital Stay: Payer: Medicare Other

## 2019-11-12 ENCOUNTER — Encounter: Payer: Self-pay | Admitting: Hematology and Oncology

## 2019-11-12 ENCOUNTER — Other Ambulatory Visit: Payer: Self-pay

## 2019-11-12 DIAGNOSIS — Z9221 Personal history of antineoplastic chemotherapy: Secondary | ICD-10-CM | POA: Diagnosis not present

## 2019-11-12 DIAGNOSIS — Z886 Allergy status to analgesic agent status: Secondary | ICD-10-CM | POA: Insufficient documentation

## 2019-11-12 DIAGNOSIS — Z7189 Other specified counseling: Secondary | ICD-10-CM

## 2019-11-12 DIAGNOSIS — Z885 Allergy status to narcotic agent status: Secondary | ICD-10-CM | POA: Diagnosis not present

## 2019-11-12 DIAGNOSIS — C9001 Multiple myeloma in remission: Secondary | ICD-10-CM

## 2019-11-12 DIAGNOSIS — Z9484 Stem cells transplant status: Secondary | ICD-10-CM | POA: Diagnosis not present

## 2019-11-12 DIAGNOSIS — Z881 Allergy status to other antibiotic agents status: Secondary | ICD-10-CM | POA: Insufficient documentation

## 2019-11-12 DIAGNOSIS — N183 Chronic kidney disease, stage 3 unspecified: Secondary | ICD-10-CM | POA: Diagnosis not present

## 2019-11-12 DIAGNOSIS — Z5112 Encounter for antineoplastic immunotherapy: Secondary | ICD-10-CM | POA: Insufficient documentation

## 2019-11-12 DIAGNOSIS — Z923 Personal history of irradiation: Secondary | ICD-10-CM | POA: Insufficient documentation

## 2019-11-12 DIAGNOSIS — Z880 Allergy status to penicillin: Secondary | ICD-10-CM | POA: Diagnosis not present

## 2019-11-12 DIAGNOSIS — Z9481 Bone marrow transplant status: Secondary | ICD-10-CM

## 2019-11-12 DIAGNOSIS — J984 Other disorders of lung: Secondary | ICD-10-CM | POA: Insufficient documentation

## 2019-11-12 DIAGNOSIS — Z79899 Other long term (current) drug therapy: Secondary | ICD-10-CM | POA: Insufficient documentation

## 2019-11-12 LAB — CBC WITH DIFFERENTIAL (CANCER CENTER ONLY)
Abs Immature Granulocytes: 0 10*3/uL (ref 0.00–0.07)
Basophils Absolute: 0.1 10*3/uL (ref 0.0–0.1)
Basophils Relative: 1 %
Eosinophils Absolute: 0.1 10*3/uL (ref 0.0–0.5)
Eosinophils Relative: 2 %
HCT: 36.2 % (ref 36.0–46.0)
Hemoglobin: 12 g/dL (ref 12.0–15.0)
Immature Granulocytes: 0 %
Lymphocytes Relative: 37 %
Lymphs Abs: 1.8 10*3/uL (ref 0.7–4.0)
MCH: 28.3 pg (ref 26.0–34.0)
MCHC: 33.1 g/dL (ref 30.0–36.0)
MCV: 85.4 fL (ref 80.0–100.0)
Monocytes Absolute: 0.6 10*3/uL (ref 0.1–1.0)
Monocytes Relative: 12 %
Neutro Abs: 2.3 10*3/uL (ref 1.7–7.7)
Neutrophils Relative %: 48 %
Platelet Count: 291 10*3/uL (ref 150–400)
RBC: 4.24 MIL/uL (ref 3.87–5.11)
RDW: 12.4 % (ref 11.5–15.5)
WBC Count: 4.9 10*3/uL (ref 4.0–10.5)
nRBC: 0 % (ref 0.0–0.2)

## 2019-11-12 LAB — CMP (CANCER CENTER ONLY)
ALT: 14 U/L (ref 0–44)
AST: 21 U/L (ref 15–41)
Albumin: 4.1 g/dL (ref 3.5–5.0)
Alkaline Phosphatase: 59 U/L (ref 38–126)
Anion gap: 10 (ref 5–15)
BUN: 8 mg/dL (ref 8–23)
CO2: 24 mmol/L (ref 22–32)
Calcium: 9.2 mg/dL (ref 8.9–10.3)
Chloride: 103 mmol/L (ref 98–111)
Creatinine: 1.05 mg/dL — ABNORMAL HIGH (ref 0.44–1.00)
GFR, Est AFR Am: >60 mL/min (ref >60)
GFR, Estimated: 54 mL/min — ABNORMAL LOW (ref >60)
Glucose, Bld: 84 mg/dL (ref 70–99)
Potassium: 3.7 mmol/L (ref 3.5–5.1)
Sodium: 137 mmol/L (ref 135–145)
Total Bilirubin: 0.5 mg/dL (ref 0.3–1.2)
Total Protein: 6.6 g/dL (ref 6.5–8.1)

## 2019-11-12 MED ORDER — DARATUMUMAB-HYALURONIDASE-FIHJ 1800-30000 MG-UT/15ML ~~LOC~~ SOLN
1800.0000 mg | Freq: Once | SUBCUTANEOUS | Status: AC
  Administered 2019-11-12: 1800 mg via SUBCUTANEOUS
  Filled 2019-11-12: qty 15

## 2019-11-12 MED ORDER — DEXAMETHASONE 4 MG PO TABS
12.0000 mg | ORAL_TABLET | Freq: Once | ORAL | Status: AC
  Administered 2019-11-12: 12 mg via ORAL

## 2019-11-12 MED ORDER — DEXAMETHASONE 4 MG PO TABS
ORAL_TABLET | ORAL | Status: AC
  Filled 2019-11-12: qty 3

## 2019-11-12 MED ORDER — DIPHENHYDRAMINE HCL 25 MG PO CAPS
ORAL_CAPSULE | ORAL | Status: AC
  Filled 2019-11-12: qty 1

## 2019-11-12 MED ORDER — ACETAMINOPHEN 325 MG PO TABS
ORAL_TABLET | ORAL | Status: AC
  Filled 2019-11-12: qty 2

## 2019-11-12 MED ORDER — MONTELUKAST SODIUM 10 MG PO TABS
10.0000 mg | ORAL_TABLET | Freq: Once | ORAL | Status: AC
  Administered 2019-11-12: 10 mg via ORAL

## 2019-11-12 MED ORDER — ACETAMINOPHEN 325 MG PO TABS
650.0000 mg | ORAL_TABLET | Freq: Once | ORAL | Status: AC
  Administered 2019-11-12: 650 mg via ORAL

## 2019-11-12 MED ORDER — MONTELUKAST SODIUM 10 MG PO TABS
ORAL_TABLET | ORAL | Status: AC
  Filled 2019-11-12: qty 1

## 2019-11-12 MED ORDER — DIPHENHYDRAMINE HCL 25 MG PO CAPS
25.0000 mg | ORAL_CAPSULE | Freq: Once | ORAL | Status: AC
  Administered 2019-11-12: 09:00:00 25 mg via ORAL

## 2019-11-12 NOTE — Patient Instructions (Signed)
[facility] Discharge Instructions for Patients Receiving Chemotherapy  Today you received the following chemotherapy agent: daratumumab faspro.  To help prevent nausea and vomiting after your treatment, we encourage you to take your nausea medication as directed.   If you develop nausea and vomiting that is not controlled by your nausea medication, call the clinic.   BELOW ARE SYMPTOMS THAT SHOULD BE REPORTED IMMEDIATELY:  *FEVER GREATER THAN 100.5 F  *CHILLS WITH OR WITHOUT FEVER  NAUSEA AND VOMITING THAT IS NOT CONTROLLED WITH YOUR NAUSEA MEDICATION  *UNUSUAL SHORTNESS OF BREATH  *UNUSUAL BRUISING OR BLEEDING  TENDERNESS IN MOUTH AND THROAT WITH OR WITHOUT PRESENCE OF ULCERS  *URINARY PROBLEMS  *BOWEL PROBLEMS  UNUSUAL RASH Items with * indicate a potential emergency and should be followed up as soon as possible.  Feel free to call the clinic should you have any questions or concerns. The clinic phone number is [phone].  Please show the [facility] at check-in to the Emergency Department and triage nurse.

## 2019-11-12 NOTE — Assessment & Plan Note (Signed)
I have reviewed recent myeloma panel with the patient She has achieved maximum response to therapy, currently at [facility] I recommend continue on Faspro only We will continue myeloma panel once a month She agreed with the plan of care She will continue Zometa every 3 months along with calcium and vitamin D supplement 

## 2019-11-12 NOTE — Progress Notes (Signed)
[place] OFFICE PROGRESS NOTE  Patient Care Team: Josetta Huddle, MD as PCP - General (Internal Medicine) Ginette Pitman, MD as Consulting Physician (Hematology and Oncology) Hessie Dibble, MD as Consulting Physician (Hematology and Oncology)  ASSESSMENT & PLAN:  Multiple myeloma in remission [facility]) I have reviewed recent myeloma panel with the patient She has achieved maximum response to therapy, currently at [facility] I recommend continue on Faspro only We will continue myeloma panel once a month She agreed with the plan of care She will continue Zometa every 3 months along with calcium and vitamin D supplement  Chronic kidney disease, stage III (moderate) She has stable chronic kidney disease I will continue reduced dose Zometa   No orders of the defined types were placed in this encounter.   All questions were answered. The patient knows to call the clinic with any problems, questions or concerns. The total time spent in the appointment was 20 minutes encounter with patients including review of chart and various tests results, discussions about plan of care and coordination of care plan   [name], MD 11/12/2019 8:35 AM  INTERVAL HISTORY: Please see below for problem oriented charting. She returns for chemotherapy and follow-up She denies immune reaction to subcutaneous daratumumab She had recent Covid vaccination without difficulties No recent dental issues No new bone pain or recent infection She has questions about social distancing and visiting family after she has completed Covid vaccination Her son and grandson has not been vaccinated  SUMMARY OF ONCOLOGIC HISTORY: Oncology History Overview Note   M-protein 0.69 gm/dl IFIX - IgG, Kappa IgG - 868 IgA - 19 IgM - < 20 Kappa - 21 Lambda - 5.7  09/06/2014 - Bone marrow aspirate and biopsy:   Normocellular marrow for age (40%) with a small monoclonal plasma cell population (1% on aspirate).  Karyotype 22, XX  FISH Negative for myeloma associated changes  09/12/2014 - PET/CT  Two regions that are concerning for disease, one adjacent/involving the left ninth rib and one in the marrow of the right femur, in this patient with history of plasmacytoma.    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   04/01/2015 Bone Marrow Transplant   She received melphalan chemotherapy on 03/31/2015 followed by autologous stem cell transplant the day after   04/03/2015 - 04/18/2015 Hospital Admission   The patient was admitted to the hospital at [place] for management related to complication from stem cell transplant.  She had significant nausea requiring intravenous anti-emetics.   07/17/2015 -  Chemotherapy   She started maintenance Revlimid and monthly zometa, then every 3 months   06/01/2018 Imaging   DEXA scan showed bone density T score in femur -2.3   04/20/2019 PET scan   1. No FDG avid osseous lesions or mass identified to suggest metabolically  active lesion of myeloma or plasmacytoma. 2. Small nodular density within the paravertebral right lower lobe exhibits mild to moderate increased uptake within SUV max of 3.38. This is indeterminate. Review of CT chest from 10/05/2018 shows a corresponding Lung nodule in this area measuring the same. Small pulmonary neoplasm cannot be excluded. 3. Indeterminate, focal area of increased uptake is identified within the thoracic canal. Indeterminate favored to represent benign physiologic CNS activity.     04/30/2019 -  Chemotherapy   The patient had daratumumab-hyaluronidase-fihj (DARZALEX FASPRO) 1800-30000 MG-UT/15ML chemo SQ injection 1,800 mg, 1,800 mg, Subcutaneous,  Once, 7 of 9 cycles Administration: 1,800 mg (04/30/2019), 1,800 mg (05/07/2019), 1,800 mg (05/14/2019), 1,800 mg (05/21/2019), 1,800 mg (05/28/2019), 1,800 mg (06/04/2019), 1,800 mg (06/11/2019), 1,800 mg (06/18/2019), 1,800 mg (06/25/2019), 1,800 mg (07/09/2019), 1,800 mg (10/15/2019), 1,800 mg (07/23/2019), 1,800 mg (08/06/2019), 1,800 mg (08/20/2019), 1,800 mg (09/03/2019), 1,800 mg (09/17/2019), 1,800 mg (10/01/2019)  for chemotherapy treatment.    04/30/2019 - 07/22/2019 Chemotherapy   The patient had bortezomib SQ (VELCADE) chemo injection 1.75 mg, 1.3 mg/m2 = 1.75 mg, Subcutaneous,  Once, 10 of 11 cycles Administration: 1.75 mg (04/30/2019), 1.75 mg (05/07/2019), 1.75 mg (05/14/2019), 1.75 mg (05/21/2019), 1.75 mg (05/28/2019), 1.75 mg (06/04/2019), 1.75 mg (06/11/2019), 1.75 mg (06/18/2019), 1.75 mg (06/25/2019), 1.75 mg (07/09/2019)  for chemotherapy treatment.      REVIEW OF SYSTEMS:   Constitutional: Denies fevers, chills or abnormal weight loss Eyes: Denies blurriness of vision Ears, nose, mouth, throat, and face: Denies mucositis or sore throat Respiratory: Denies cough, dyspnea or wheezes Cardiovascular: Denies palpitation, chest discomfort or lower extremity swelling Gastrointestinal:  Denies nausea, heartburn or change in bowel  habits Skin: Denies abnormal skin rashes Lymphatics: Denies new lymphadenopathy or easy bruising Neurological:Denies numbness, tingling or new weaknesses Behavioral/Psych: Mood is stable, no new changes  All other systems were reviewed with the patient and are negative.  I have reviewed the past medical history, past surgical history, social history and family history with the patient and they are unchanged from previous note.  ALLERGIES:  is allergic to augmentin [amoxicillin-pot clavulanate]; albuterol; codeine; ibuprofen; nsaids; and tolmetin.  MEDICATIONS:  Current Outpatient Medications  Medication Sig Dispense Refill  . acetaminophen (TYLENOL) 500 MG tablet Take 500-1,000 mg by mouth every 6 (six) hours as needed for mild pain, moderate pain, fever or headache.    Marland Kitchen acyclovir (ZOVIRAX) 400 MG tablet Take 1 tablet (400 mg total) by mouth 2 (two) times daily. 180 tablet 3  . aspirin EC 81 MG tablet Take 81 mg by mouth daily with breakfast.    . calcium carbonate (OSCAL) 1500 (600 Ca) MG TABS tablet Take 1,500 mg by mouth daily with breakfast.    . Calcium Carbonate 500 MG CHEW Chew 500 mg by mouth as needed for indigestion.    . Cholecalciferol (VITAMIN D3) 2000 units capsule Take 2,000 Units by mouth daily.     . Multiple Vitamins-Minerals (CENTRUM SILVER PO) Take 1 tablet by mouth daily.     Marland Kitchen omeprazole (PRILOSEC) 20 MG capsule Take 1 capsule (20 mg total) by mouth daily. 90 capsule 11  . ondansetron (  ZOFRAN) 8 MG tablet TAKE 1 TABLET(8 MG) BY MOUTH EVERY 8 HOURS AS NEEDED FOR NAUSEA OR VOMITING 60 tablet 1  . senna-docusate (SENOKOT-S) 8.6-50 MG tablet Take 1-2 tablets by mouth daily as needed for mild constipation or moderate constipation.     . sodium chloride (OCEAN) 0.65 % SOLN nasal spray Place 1 spray into both nostrils as needed for congestion.     No current facility-administered medications for this visit.   Facility-Administered Medications Ordered in Other Visits   Medication Dose Route Frequency Provider Last Rate Last Admin  . 0.9 %  sodium chloride infusion   Intravenous Once Alvy Bimler, Tahtiana Rozier, MD        PHYSICAL EXAMINATION: ECOG PERFORMANCE STATUS: 0 - Asymptomatic  Vitals:   11/12/19 0827  BP: 132/83  Pulse: 84  Resp: 18  Temp: 97.8 F (36.6 C)  SpO2: 99%   Filed Weights   11/12/19 0827  Weight: 117 lb 12.8 oz (53.4 kg)    GENERAL:alert, no distress and comfortable SKIN: skin color, texture, turgor are normal, no rashes or significant lesions EYES: normal, Conjunctiva are pink and non-injected, sclera clear OROPHARYNX:no exudate, no erythema and lips, buccal mucosa, and tongue normal  NECK: supple, thyroid normal size, non-tender, without nodularity LYMPH:  no palpable lymphadenopathy in the cervical, axillary or inguinal LUNGS: clear to auscultation and percussion with normal breathing effort HEART: regular rate & rhythm and no murmurs and no lower extremity edema ABDOMEN:abdomen soft, non-tender and normal bowel sounds Musculoskeletal:no cyanosis of digits and no clubbing  NEURO: alert & oriented x 3 with fluent speech, no focal motor/sensory deficits  LABORATORY DATA:  I have reviewed the data as listed    Component Value Date/Time   NA 141 10/15/2019 0857   NA 143 07/14/2017 1245   K 3.8 10/15/2019 0857   K 3.3 (L) 07/14/2017 1245   CL 106 10/15/2019 0857   CO2 24 10/15/2019 0857   CO2 20 (L) 07/14/2017 1245   GLUCOSE 83 10/15/2019 0857   GLUCOSE 106 07/14/2017 1245   BUN 10 10/15/2019 0857   BUN 9.3 07/14/2017 1245   CREATININE 1.11 (H) 10/15/2019 0857   CREATININE 0.8 07/14/2017 1245   CALCIUM 8.9 10/15/2019 0857   CALCIUM 8.4 07/14/2017 1245   PROT 6.6 10/15/2019 0857   PROT 6.0 07/14/2017 1245   PROT 6.2 (L) 07/14/2017 1245   ALBUMIN 4.1 10/15/2019 0857   ALBUMIN 3.6 07/14/2017 1245   AST 18 10/15/2019 0857   AST 21 07/14/2017 1245   ALT 13 10/15/2019 0857   ALT 21 07/14/2017 1245   ALKPHOS 58 10/15/2019  0857   ALKPHOS 78 07/14/2017 1245   BILITOT 0.4 10/15/2019 0857   BILITOT 0.43 07/14/2017 1245   GFRNONAA 51 (L) 10/15/2019 0857   GFRAA 59 (L) 10/15/2019 0857    No results found for: SPEP, UPEP  Lab Results  Component Value Date   WBC 4.9 11/12/2019   NEUTROABS 2.3 11/12/2019   HGB 12.0 11/12/2019   HCT 36.2 11/12/2019   MCV 85.4 11/12/2019   PLT 291 11/12/2019      Chemistry      Component Value Date/Time   NA 141 10/15/2019 0857   NA 143 07/14/2017 1245   K 3.8 10/15/2019 0857   K 3.3 (L) 07/14/2017 1245   CL 106 10/15/2019 0857   CO2 24 10/15/2019 0857   CO2 20 (L) 07/14/2017 1245   BUN 10 10/15/2019 0857   BUN 9.3 07/14/2017 1245   CREATININE  1.11 (H) 10/15/2019 0857   CREATININE 0.8 07/14/2017 1245      Component Value Date/Time   CALCIUM 8.9 10/15/2019 0857   CALCIUM 8.4 07/14/2017 1245   ALKPHOS 58 10/15/2019 0857   ALKPHOS 78 07/14/2017 1245   AST 18 10/15/2019 0857   AST 21 07/14/2017 1245   ALT 13 10/15/2019 0857   ALT 21 07/14/2017 1245   BILITOT 0.4 10/15/2019 0857   BILITOT 0.43 07/14/2017 1245

## 2019-11-12 NOTE — Assessment & Plan Note (Signed)
She has stable chronic kidney disease I will continue reduced dose Zometa 

## 2019-11-13 LAB — KAPPA/LAMBDA LIGHT CHAINS
Kappa free light chain: 5.7 mg/L (ref 3.3–19.4)
Kappa, lambda light chain ratio: 3.56 — ABNORMAL HIGH (ref 0.26–1.65)
Lambda free light chains: 1.6 mg/L — ABNORMAL LOW (ref 5.7–26.3)

## 2019-11-15 LAB — MULTIPLE MYELOMA PANEL, SERUM
Albumin SerPl Elph-Mcnc: 4 g/dL (ref 2.9–4.4)
Albumin/Glob SerPl: 1.8 — ABNORMAL HIGH (ref 0.7–1.7)
Alpha 1: 0.2 g/dL (ref 0.0–0.4)
Alpha2 Glob SerPl Elph-Mcnc: 0.9 g/dL (ref 0.4–1.0)
B-Globulin SerPl Elph-Mcnc: 1 g/dL (ref 0.7–1.3)
Gamma Glob SerPl Elph-Mcnc: 0.2 g/dL — ABNORMAL LOW (ref 0.4–1.8)
Globulin, Total: 2.3 g/dL (ref 2.2–3.9)
IgA: <5 mg/dL — ABNORMAL LOW (ref 87–352)
IgG (Immunoglobin G), Serum: 361 mg/dL — ABNORMAL LOW (ref 586–1602)
IgM (Immunoglobulin M), Srm: <5 mg/dL — ABNORMAL LOW (ref 26–217)
M Protein SerPl Elph-Mcnc: 0.1 g/dL — ABNORMAL HIGH
Total Protein ELP: 6.3 g/dL (ref 6.0–8.5)

## 2019-12-04 NOTE — Progress Notes (Signed)
Pharmacist Chemotherapy Monitoring - Follow Up Assessment    I verify that I have reviewed each item in the below checklist:  . Regimen for the patient is scheduled for the appropriate day and plan matches scheduled date. Marland Kitchen Appropriate non-routine labs are ordered dependent on drug ordered. . If applicable, additional medications reviewed and ordered per protocol based on lifetime cumulative doses and/or treatment regimen.   Plan for follow-up and/or issues identified: No . I-vent associated with next due treatment: No . MD and/or nursing notified: No  Jessica [name] 12/04/2019 11:19 AM

## 2019-12-10 ENCOUNTER — Inpatient Hospital Stay: Payer: Medicare Other

## 2019-12-10 ENCOUNTER — Other Ambulatory Visit: Payer: Self-pay

## 2019-12-10 ENCOUNTER — Inpatient Hospital Stay (HOSPITAL_BASED_OUTPATIENT_CLINIC_OR_DEPARTMENT_OTHER): Payer: Medicare Other | Admitting: Hematology and Oncology

## 2019-12-10 ENCOUNTER — Inpatient Hospital Stay: Payer: Medicare Other | Attending: Hematology and Oncology

## 2019-12-10 ENCOUNTER — Encounter: Payer: Self-pay | Admitting: Hematology and Oncology

## 2019-12-10 DIAGNOSIS — D638 Anemia in other chronic diseases classified elsewhere: Secondary | ICD-10-CM | POA: Diagnosis not present

## 2019-12-10 DIAGNOSIS — R42 Dizziness and giddiness: Secondary | ICD-10-CM | POA: Insufficient documentation

## 2019-12-10 DIAGNOSIS — Z9481 Bone marrow transplant status: Secondary | ICD-10-CM

## 2019-12-10 DIAGNOSIS — R531 Weakness: Secondary | ICD-10-CM | POA: Diagnosis not present

## 2019-12-10 DIAGNOSIS — Z5112 Encounter for antineoplastic immunotherapy: Secondary | ICD-10-CM | POA: Diagnosis present

## 2019-12-10 DIAGNOSIS — Z881 Allergy status to other antibiotic agents status: Secondary | ICD-10-CM | POA: Insufficient documentation

## 2019-12-10 DIAGNOSIS — Z7189 Other specified counseling: Secondary | ICD-10-CM

## 2019-12-10 DIAGNOSIS — D6481 Anemia due to antineoplastic chemotherapy: Secondary | ICD-10-CM | POA: Diagnosis not present

## 2019-12-10 DIAGNOSIS — J984 Other disorders of lung: Secondary | ICD-10-CM | POA: Diagnosis not present

## 2019-12-10 DIAGNOSIS — N183 Chronic kidney disease, stage 3 unspecified: Secondary | ICD-10-CM

## 2019-12-10 DIAGNOSIS — Z880 Allergy status to penicillin: Secondary | ICD-10-CM | POA: Insufficient documentation

## 2019-12-10 DIAGNOSIS — C9001 Multiple myeloma in remission: Secondary | ICD-10-CM

## 2019-12-10 DIAGNOSIS — Z885 Allergy status to narcotic agent status: Secondary | ICD-10-CM | POA: Insufficient documentation

## 2019-12-10 DIAGNOSIS — Z886 Allergy status to analgesic agent status: Secondary | ICD-10-CM | POA: Diagnosis not present

## 2019-12-10 DIAGNOSIS — R5383 Other fatigue: Secondary | ICD-10-CM | POA: Insufficient documentation

## 2019-12-10 DIAGNOSIS — Z9484 Stem cells transplant status: Secondary | ICD-10-CM | POA: Insufficient documentation

## 2019-12-10 DIAGNOSIS — Z79899 Other long term (current) drug therapy: Secondary | ICD-10-CM | POA: Diagnosis not present

## 2019-12-10 LAB — CMP (CANCER CENTER ONLY)
ALT: 17 U/L (ref 0–44)
AST: 23 U/L (ref 15–41)
Albumin: 3.9 g/dL (ref 3.5–5.0)
Alkaline Phosphatase: 63 U/L (ref 38–126)
Anion gap: 10 (ref 5–15)
BUN: 8 mg/dL (ref 8–23)
CO2: 25 mmol/L (ref 22–32)
Calcium: 9.1 mg/dL (ref 8.9–10.3)
Chloride: 109 mmol/L (ref 98–111)
Creatinine: 0.96 mg/dL (ref 0.44–1.00)
GFR, Est AFR Am: >60 mL/min (ref >60)
GFR, Estimated: 60 mL/min — ABNORMAL LOW (ref >60)
Glucose, Bld: 86 mg/dL (ref 70–99)
Potassium: 3.8 mmol/L (ref 3.5–5.1)
Sodium: 144 mmol/L (ref 135–145)
Total Bilirubin: 0.6 mg/dL (ref 0.3–1.2)
Total Protein: 6.4 g/dL — ABNORMAL LOW (ref 6.5–8.1)

## 2019-12-10 LAB — CBC WITH DIFFERENTIAL (CANCER CENTER ONLY)
Abs Immature Granulocytes: 0.01 10*3/uL (ref 0.00–0.07)
Basophils Absolute: 0 10*3/uL (ref 0.0–0.1)
Basophils Relative: 1 %
Eosinophils Absolute: 0.2 10*3/uL (ref 0.0–0.5)
Eosinophils Relative: 4 %
HCT: 34.5 % — ABNORMAL LOW (ref 36.0–46.0)
Hemoglobin: 11.5 g/dL — ABNORMAL LOW (ref 12.0–15.0)
Immature Granulocytes: 0 %
Lymphocytes Relative: 38 %
Lymphs Abs: 1.7 10*3/uL (ref 0.7–4.0)
MCH: 28.7 pg (ref 26.0–34.0)
MCHC: 33.3 g/dL (ref 30.0–36.0)
MCV: 86 fL (ref 80.0–100.0)
Monocytes Absolute: 0.5 10*3/uL (ref 0.1–1.0)
Monocytes Relative: 11 %
Neutro Abs: 2 10*3/uL (ref 1.7–7.7)
Neutrophils Relative %: 46 %
Platelet Count: 295 10*3/uL (ref 150–400)
RBC: 4.01 MIL/uL (ref 3.87–5.11)
RDW: 13.2 % (ref 11.5–15.5)
WBC Count: 4.4 10*3/uL (ref 4.0–10.5)
nRBC: 0 % (ref 0.0–0.2)

## 2019-12-10 MED ORDER — DEXAMETHASONE 4 MG PO TABS
ORAL_TABLET | ORAL | Status: AC
  Filled 2019-12-10: qty 3

## 2019-12-10 MED ORDER — DIPHENHYDRAMINE HCL 25 MG PO CAPS
25.0000 mg | ORAL_CAPSULE | Freq: Once | ORAL | Status: AC
  Administered 2019-12-10: 25 mg via ORAL

## 2019-12-10 MED ORDER — DIPHENHYDRAMINE HCL 25 MG PO CAPS
ORAL_CAPSULE | ORAL | Status: AC
  Filled 2019-12-10: qty 1

## 2019-12-10 MED ORDER — MONTELUKAST SODIUM 10 MG PO TABS
10.0000 mg | ORAL_TABLET | Freq: Once | ORAL | Status: AC
  Administered 2019-12-10: 10 mg via ORAL

## 2019-12-10 MED ORDER — SODIUM CHLORIDE 0.9 % IV SOLN
Freq: Once | INTRAVENOUS | Status: AC
  Filled 2019-12-10: qty 250

## 2019-12-10 MED ORDER — DARATUMUMAB-HYALURONIDASE-FIHJ 1800-30000 MG-UT/15ML ~~LOC~~ SOLN
1800.0000 mg | Freq: Once | SUBCUTANEOUS | Status: AC
  Administered 2019-12-10: 1800 mg via SUBCUTANEOUS
  Filled 2019-12-10: qty 15

## 2019-12-10 MED ORDER — ZOLEDRONIC ACID 4 MG/5ML IV CONC
3.0000 mg | Freq: Once | INTRAVENOUS | Status: AC
  Administered 2019-12-10: 3 mg via INTRAVENOUS
  Filled 2019-12-10: qty 3.75

## 2019-12-10 MED ORDER — ACETAMINOPHEN 325 MG PO TABS
650.0000 mg | ORAL_TABLET | Freq: Once | ORAL | Status: AC
  Administered 2019-12-10: 650 mg via ORAL

## 2019-12-10 MED ORDER — DEXAMETHASONE 4 MG PO TABS
12.0000 mg | ORAL_TABLET | Freq: Once | ORAL | Status: AC
  Administered 2019-12-10: 12 mg via ORAL

## 2019-12-10 MED ORDER — MONTELUKAST SODIUM 10 MG PO TABS
ORAL_TABLET | ORAL | Status: AC
  Filled 2019-12-10: qty 1

## 2019-12-10 MED ORDER — ACETAMINOPHEN 325 MG PO TABS
ORAL_TABLET | ORAL | Status: AC
  Filled 2019-12-10: qty 2

## 2019-12-10 NOTE — Progress Notes (Signed)
[facility] OFFICE PROGRESS NOTE  Patient Care Team: Josetta Huddle, MD as PCP - General (Internal Medicine) Ginette Pitman, MD as Consulting Physician (Hematology and Oncology) Hessie Dibble, MD as Consulting Physician (Hematology and Oncology)  ASSESSMENT & PLAN:  Multiple myeloma in remission [facility]) I have reviewed recent myeloma panel with the patient She has achieved maximum response to therapy, currently at [facility] I recommend continue on Faspro only We will continue myeloma panel once a month She agreed with the plan of care She will continue Zometa every 3 months along with calcium and vitamin D supplement  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  Observe  Chronic kidney disease, stage III (moderate) She has stable chronic kidney disease I will continue reduced dose Zometa  Weakness I highly recommend her to consider physical therapy consult That could help with her weakness and improve physical activity and that in turn can give her more energy and better sleep She will think about it I completed an application for a temporary parking placard for her   No orders of the defined types were placed in this encounter.   All questions were answered. The patient knows to call the clinic with any problems, questions or concerns. The total time spent in the appointment was 20 minutes encounter with patients including review of chart and various tests results, discussions about plan of care and coordination of care plan   [name], MD 12/10/2019 9:24 AM  INTERVAL HISTORY: Please see below for problem oriented charting. She returns for treatment today She is doing well No recent infection, fever or chills Negative dental visits She continues to have occasional dizziness and poor sleep She admits that she has not been exercising on a regular basis She  complains of fatigue and felt weak overall She denies recent falls She has questions about social distancing.  Her son has not been vaccinated  SUMMARY OF ONCOLOGIC HISTORY: Oncology History Overview Note   M-protein 0.69 gm/dl IFIX - IgG, Kappa IgG - 868 IgA - 19 IgM - < 20 Kappa - 21 Lambda - 5.7  09/06/2014 - Bone marrow aspirate and biopsy:   Normocellular marrow for age (40%) with a small monoclonal plasma cell population (1% on aspirate). Karyotype 66, XX  FISH Negative for myeloma associated changes  09/12/2014 - PET/CT  Two regions that are concerning for disease, one adjacent/involving the left ninth rib and one in the marrow of the right femur, in this patient with history of plasmacytoma.    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   04/01/2015 Bone Marrow Transplant   She received melphalan chemotherapy on 03/31/2015 followed by autologous stem cell transplant the day after   04/03/2015 - 04/18/2015 Hospital Admission   The patient  was admitted to the hospital at [facility] for management related to complication from stem cell transplant. She had significant nausea requiring intravenous anti-emetics.   07/17/2015 -  Chemotherapy   She started maintenance Revlimid and monthly zometa, then every 3 months   06/01/2018 Imaging   DEXA scan showed bone density T score in femur -2.3   04/20/2019 PET scan   1. No FDG avid osseous lesions or mass identified to suggest metabolically active lesion of myeloma or plasmacytoma. 2. Small nodular density within the paravertebral right lower lobe exhibits mild to moderate increased uptake within SUV max of 3.38. This is indeterminate. Review of CT chest from 10/05/2018 shows a corresponding Lung nodule in this area measuring the same. Small pulmonary neoplasm cannot be excluded. 3. Indeterminate, focal area of increased uptake is identified within the thoracic canal. Indeterminate favored to represent benign physiologic CNS activity.     04/30/2019 -  Chemotherapy   The patient had daratumumab-hyaluronidase-fihj (DARZALEX FASPRO) 1800-30000 MG-UT/15ML chemo SQ injection 1,800 mg, 1,800 mg, Subcutaneous,  Once, 8 of 11 cycles Administration: 1,800 mg (04/30/2019), 1,800 mg (05/07/2019), 1,800 mg (05/14/2019), 1,800 mg (05/21/2019), 1,800 mg (05/28/2019), 1,800 mg (06/04/2019), 1,800 mg (06/11/2019), 1,800 mg (06/18/2019), 1,800 mg (06/25/2019), 1,800 mg (07/09/2019), 1,800 mg (10/15/2019), 1,800 mg (07/23/2019), 1,800 mg (08/06/2019), 1,800 mg (08/20/2019), 1,800 mg (09/03/2019), 1,800 mg (09/17/2019), 1,800 mg (10/01/2019), 1,800 mg (11/12/2019)  for chemotherapy treatment.    04/30/2019 - 07/22/2019 Chemotherapy   The patient had bortezomib SQ (VELCADE) chemo injection 1.75 mg, 1.3 mg/m2 = 1.75 mg, Subcutaneous,  Once, 10 of 11 cycles Administration: 1.75 mg (04/30/2019), 1.75 mg (05/07/2019), 1.75 mg (05/14/2019), 1.75 mg (05/21/2019), 1.75 mg (05/28/2019), 1.75 mg (06/04/2019), 1.75 mg (06/11/2019), 1.75  mg (06/18/2019), 1.75 mg (06/25/2019), 1.75 mg (07/09/2019)  for chemotherapy treatment.      REVIEW OF SYSTEMS:   Constitutional: Denies fevers, chills or abnormal weight loss Eyes: Denies blurriness of vision Ears, nose, mouth, throat, and face: Denies mucositis or sore throat Respiratory: Denies cough, dyspnea or wheezes Cardiovascular: Denies palpitation, chest discomfort or lower extremity swelling Gastrointestinal:  Denies nausea, heartburn or change in bowel habits Skin: Denies abnormal skin rashes Lymphatics: Denies new lymphadenopathy or easy bruising Behavioral/Psych: Mood is stable, no new changes  All other systems were reviewed with the patient and are negative.  I have reviewed the past medical history, past surgical history, social history and family history with the patient and they are unchanged from previous note.  ALLERGIES:  is allergic to augmentin [amoxicillin-pot clavulanate]; albuterol; codeine; ibuprofen; nsaids; and tolmetin.  MEDICATIONS:  Current Outpatient Medications  Medication Sig Dispense Refill  . acetaminophen (TYLENOL) 500 MG tablet Take 500-1,000 mg by mouth every 6 (six) hours as needed for mild pain, moderate pain, fever or headache.    Marland Kitchen acyclovir (ZOVIRAX) 400 MG tablet Take 1 tablet (400 mg total) by mouth 2 (two) times daily. 180 tablet 3  . aspirin EC 81 MG tablet Take 81 mg by mouth daily with breakfast.    . calcium carbonate (OSCAL) 1500 (600 Ca) MG TABS tablet Take 1,500  mg by mouth daily with breakfast.    . Calcium Carbonate 500 MG CHEW Chew 500 mg by mouth as needed for indigestion.    . Cholecalciferol (VITAMIN D3) 2000 units capsule Take 2,000 Units by mouth daily.     . Multiple Vitamins-Minerals (CENTRUM SILVER PO) Take 1 tablet by mouth daily.     Marland Kitchen omeprazole (PRILOSEC) 20 MG capsule Take 1 capsule (20 mg total) by mouth daily. 90 capsule 11  . ondansetron (ZOFRAN) 8 MG tablet TAKE 1 TABLET(8 MG) BY MOUTH EVERY 8 HOURS AS NEEDED  FOR NAUSEA OR VOMITING 60 tablet 1  . senna-docusate (SENOKOT-S) 8.6-50 MG tablet Take 1-2 tablets by mouth daily as needed for mild constipation or moderate constipation.     . sodium chloride (OCEAN) 0.65 % SOLN nasal spray Place 1 spray into both nostrils as needed for congestion.     No current facility-administered medications for this visit.   Facility-Administered Medications Ordered in Other Visits  Medication Dose Route Frequency Provider Last Rate Last Admin  . 0.9 %  sodium chloride infusion   Intravenous Once Alvy Bimler, Jaquarius Seder, MD        PHYSICAL EXAMINATION: ECOG PERFORMANCE STATUS: 1 - Symptomatic but completely ambulatory  Vitals:   12/10/19 0909  BP: (!) 139/93  Pulse: 86  Resp: 18  Temp: 98.3 F (36.8 C)  SpO2: 100%   Filed Weights   12/10/19 0909  Weight: 117 lb 9.6 oz (53.3 kg)    GENERAL:alert, no distress and comfortable NEURO: alert & oriented x 3 with fluent speech, no focal motor/sensory deficits  LABORATORY DATA:  I have reviewed the data as listed    Component Value Date/Time   NA 137 11/12/2019 0804   NA 143 07/14/2017 1245   K 3.7 11/12/2019 0804   K 3.3 (L) 07/14/2017 1245   CL 103 11/12/2019 0804   CO2 24 11/12/2019 0804   CO2 20 (L) 07/14/2017 1245   GLUCOSE 84 11/12/2019 0804   GLUCOSE 106 07/14/2017 1245   BUN 8 11/12/2019 0804   BUN 9.3 07/14/2017 1245   CREATININE 1.05 (H) 11/12/2019 0804   CREATININE 0.8 07/14/2017 1245   CALCIUM 9.2 11/12/2019 0804   CALCIUM 8.4 07/14/2017 1245   PROT 6.6 11/12/2019 0804   PROT 6.0 07/14/2017 1245   PROT 6.2 (L) 07/14/2017 1245   ALBUMIN 4.1 11/12/2019 0804   ALBUMIN 3.6 07/14/2017 1245   AST 21 11/12/2019 0804   AST 21 07/14/2017 1245   ALT 14 11/12/2019 0804   ALT 21 07/14/2017 1245   ALKPHOS 59 11/12/2019 0804   ALKPHOS 78 07/14/2017 1245   BILITOT 0.5 11/12/2019 0804   BILITOT 0.43 07/14/2017 1245   GFRNONAA 54 (L) 11/12/2019 0804   GFRAA >60 11/12/2019 0804    No results found for:  SPEP, UPEP  Lab Results  Component Value Date   WBC 4.4 12/10/2019   NEUTROABS 2.0 12/10/2019   HGB 11.5 (L) 12/10/2019   HCT 34.5 (L) 12/10/2019   MCV 86.0 12/10/2019   PLT 295 12/10/2019      Chemistry      Component Value Date/Time   NA 137 11/12/2019 0804   NA 143 07/14/2017 1245   K 3.7 11/12/2019 0804   K 3.3 (L) 07/14/2017 1245   CL 103 11/12/2019 0804   CO2 24 11/12/2019 0804   CO2 20 (L) 07/14/2017 1245   BUN 8 11/12/2019 0804   BUN 9.3 07/14/2017 1245   CREATININE 1.05 (H) 11/12/2019 0804  CREATININE 0.8 07/14/2017 1245      Component Value Date/Time   CALCIUM 9.2 11/12/2019 0804   CALCIUM 8.4 07/14/2017 1245   ALKPHOS 59 11/12/2019 0804   ALKPHOS 78 07/14/2017 1245   AST 21 11/12/2019 0804   AST 21 07/14/2017 1245   ALT 14 11/12/2019 0804   ALT 21 07/14/2017 1245   BILITOT 0.5 11/12/2019 0804   BILITOT 0.43 07/14/2017 1245

## 2019-12-10 NOTE — Patient Instructions (Signed)
[facility] Discharge Instructions for Patients Receiving Chemotherapy  Today you received the following chemotherapy agents:  Zometa and Darzalex Faspro  To help prevent nausea and vomiting after your treatment, we encourage you to take your nausea medication as prescribed.    If you develop nausea and vomiting that is not controlled by your nausea medication, call the clinic.   BELOW ARE SYMPTOMS THAT SHOULD BE REPORTED IMMEDIATELY:  *FEVER GREATER THAN 100.5 F  *CHILLS WITH OR WITHOUT FEVER  NAUSEA AND VOMITING THAT IS NOT CONTROLLED WITH YOUR NAUSEA MEDICATION  *UNUSUAL SHORTNESS OF BREATH  *UNUSUAL BRUISING OR BLEEDING  TENDERNESS IN MOUTH AND THROAT WITH OR WITHOUT PRESENCE OF ULCERS  *URINARY PROBLEMS  *BOWEL PROBLEMS  UNUSUAL RASH Items with * indicate a potential emergency and should be followed up as soon as possible.  Feel free to call the clinic should you have any questions or concerns. The clinic phone number is [phone].  Please show the [facility] at check-in to the Emergency Department and triage nurse.  Please bring copies of living will and healthcare power of attorney to your next treatment, so that they can be scanned into our systems.

## 2019-12-10 NOTE — Assessment & Plan Note (Addendum)
I highly recommend her to consider physical therapy consult That could help with her weakness and improve physical activity and that in turn can give her more energy and better sleep She will think about it I completed an application for a temporary parking placard for her

## 2019-12-10 NOTE — Assessment & Plan Note (Signed)
She has stable chronic kidney disease I will continue reduced dose Zometa 

## 2019-12-10 NOTE — Assessment & Plan Note (Signed)
I have reviewed recent myeloma panel with the patient She has achieved maximum response to therapy, currently at [facility] I recommend continue on Faspro only We will continue myeloma panel once a month She agreed with the plan of care She will continue Zometa every 3 months along with calcium and vitamin D supplement 

## 2019-12-10 NOTE — Assessment & Plan Note (Signed)
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  Observe  

## 2019-12-11 ENCOUNTER — Telehealth: Payer: Self-pay | Admitting: Hematology and Oncology

## 2019-12-11 LAB — KAPPA/LAMBDA LIGHT CHAINS
Kappa free light chain: 5 mg/L (ref 3.3–19.4)
Kappa, lambda light chain ratio: 2.94 — ABNORMAL HIGH (ref 0.26–1.65)
Lambda free light chains: 1.7 mg/L — ABNORMAL LOW (ref 5.7–26.3)

## 2019-12-11 LAB — MULTIPLE MYELOMA PANEL, SERUM
Albumin SerPl Elph-Mcnc: 3.6 g/dL (ref 2.9–4.4)
Albumin/Glob SerPl: 1.6 (ref 0.7–1.7)
Alpha 1: 0.2 g/dL (ref 0.0–0.4)
Alpha2 Glob SerPl Elph-Mcnc: 0.8 g/dL (ref 0.4–1.0)
B-Globulin SerPl Elph-Mcnc: 1.1 g/dL (ref 0.7–1.3)
Gamma Glob SerPl Elph-Mcnc: 0.3 g/dL — ABNORMAL LOW (ref 0.4–1.8)
Globulin, Total: 2.4 g/dL (ref 2.2–3.9)
IgA: <5 mg/dL — ABNORMAL LOW (ref 87–352)
IgG (Immunoglobin G), Serum: 301 mg/dL — ABNORMAL LOW (ref 586–1602)
IgM (Immunoglobulin M), Srm: <5 mg/dL — ABNORMAL LOW (ref 26–217)
M Protein SerPl Elph-Mcnc: 0.1 g/dL — ABNORMAL HIGH
Total Protein ELP: 6 g/dL (ref 6.0–8.5)

## 2019-12-11 NOTE — Telephone Encounter (Signed)
Scheduled appts per 5/3 sch msg. Pt confirmed appt date and time.  

## 2020-01-14 ENCOUNTER — Inpatient Hospital Stay: Payer: Medicare Other | Attending: Hematology and Oncology

## 2020-01-14 ENCOUNTER — Inpatient Hospital Stay: Payer: Medicare Other

## 2020-01-14 ENCOUNTER — Encounter: Payer: Self-pay | Admitting: Hematology and Oncology

## 2020-01-14 ENCOUNTER — Inpatient Hospital Stay (HOSPITAL_BASED_OUTPATIENT_CLINIC_OR_DEPARTMENT_OTHER): Payer: Medicare Other | Admitting: Hematology and Oncology

## 2020-01-14 ENCOUNTER — Other Ambulatory Visit: Payer: Self-pay | Admitting: Hematology and Oncology

## 2020-01-14 ENCOUNTER — Other Ambulatory Visit: Payer: Self-pay

## 2020-01-14 DIAGNOSIS — C9001 Multiple myeloma in remission: Secondary | ICD-10-CM

## 2020-01-14 DIAGNOSIS — Z881 Allergy status to other antibiotic agents status: Secondary | ICD-10-CM | POA: Diagnosis not present

## 2020-01-14 DIAGNOSIS — R519 Headache, unspecified: Secondary | ICD-10-CM | POA: Insufficient documentation

## 2020-01-14 DIAGNOSIS — Z880 Allergy status to penicillin: Secondary | ICD-10-CM | POA: Insufficient documentation

## 2020-01-14 DIAGNOSIS — Z9484 Stem cells transplant status: Secondary | ICD-10-CM | POA: Diagnosis not present

## 2020-01-14 DIAGNOSIS — M79603 Pain in arm, unspecified: Secondary | ICD-10-CM | POA: Insufficient documentation

## 2020-01-14 DIAGNOSIS — J984 Other disorders of lung: Secondary | ICD-10-CM | POA: Diagnosis not present

## 2020-01-14 DIAGNOSIS — Z886 Allergy status to analgesic agent status: Secondary | ICD-10-CM | POA: Insufficient documentation

## 2020-01-14 DIAGNOSIS — R531 Weakness: Secondary | ICD-10-CM | POA: Diagnosis not present

## 2020-01-14 DIAGNOSIS — Z79899 Other long term (current) drug therapy: Secondary | ICD-10-CM | POA: Diagnosis not present

## 2020-01-14 DIAGNOSIS — Z9181 History of falling: Secondary | ICD-10-CM | POA: Insufficient documentation

## 2020-01-14 DIAGNOSIS — Z885 Allergy status to narcotic agent status: Secondary | ICD-10-CM | POA: Diagnosis not present

## 2020-01-14 DIAGNOSIS — Z9481 Bone marrow transplant status: Secondary | ICD-10-CM

## 2020-01-14 DIAGNOSIS — Z5112 Encounter for antineoplastic immunotherapy: Secondary | ICD-10-CM | POA: Diagnosis not present

## 2020-01-14 DIAGNOSIS — Z7189 Other specified counseling: Secondary | ICD-10-CM

## 2020-01-14 DIAGNOSIS — D6481 Anemia due to antineoplastic chemotherapy: Secondary | ICD-10-CM | POA: Diagnosis not present

## 2020-01-14 DIAGNOSIS — D638 Anemia in other chronic diseases classified elsewhere: Secondary | ICD-10-CM

## 2020-01-14 LAB — CBC WITH DIFFERENTIAL (CANCER CENTER ONLY)
Abs Immature Granulocytes: 0.01 10*3/uL (ref 0.00–0.07)
Basophils Absolute: 0 10*3/uL (ref 0.0–0.1)
Basophils Relative: 1 %
Eosinophils Absolute: 0.1 10*3/uL (ref 0.0–0.5)
Eosinophils Relative: 2 %
HCT: 35.6 % — ABNORMAL LOW (ref 36.0–46.0)
Hemoglobin: 11.6 g/dL — ABNORMAL LOW (ref 12.0–15.0)
Immature Granulocytes: 0 %
Lymphocytes Relative: 36 %
Lymphs Abs: 1.7 10*3/uL (ref 0.7–4.0)
MCH: 28.2 pg (ref 26.0–34.0)
MCHC: 32.6 g/dL (ref 30.0–36.0)
MCV: 86.6 fL (ref 80.0–100.0)
Monocytes Absolute: 0.5 10*3/uL (ref 0.1–1.0)
Monocytes Relative: 11 %
Neutro Abs: 2.4 10*3/uL (ref 1.7–7.7)
Neutrophils Relative %: 50 %
Platelet Count: 291 10*3/uL (ref 150–400)
RBC: 4.11 MIL/uL (ref 3.87–5.11)
RDW: 13 % (ref 11.5–15.5)
WBC Count: 4.8 10*3/uL (ref 4.0–10.5)
nRBC: 0 % (ref 0.0–0.2)

## 2020-01-14 LAB — CMP (CANCER CENTER ONLY)
ALT: 15 U/L (ref 0–44)
AST: 21 U/L (ref 15–41)
Albumin: 4.1 g/dL (ref 3.5–5.0)
Alkaline Phosphatase: 58 U/L (ref 38–126)
Anion gap: 9 (ref 5–15)
BUN: 6 mg/dL — ABNORMAL LOW (ref 8–23)
CO2: 22 mmol/L (ref 22–32)
Calcium: 9.2 mg/dL (ref 8.9–10.3)
Chloride: 106 mmol/L (ref 98–111)
Creatinine: 1.07 mg/dL — ABNORMAL HIGH (ref 0.44–1.00)
GFR, Est AFR Am: >60 mL/min (ref >60)
GFR, Estimated: 53 mL/min — ABNORMAL LOW (ref >60)
Glucose, Bld: 86 mg/dL (ref 70–99)
Potassium: 3.9 mmol/L (ref 3.5–5.1)
Sodium: 137 mmol/L (ref 135–145)
Total Bilirubin: 0.8 mg/dL (ref 0.3–1.2)
Total Protein: 6.6 g/dL (ref 6.5–8.1)

## 2020-01-14 MED ORDER — OMEPRAZOLE 20 MG PO CPDR: 20.0000 mg | DELAYED_RELEASE_CAPSULE | Freq: Every day | ORAL | 11 refills | Status: DC

## 2020-01-14 MED ORDER — ACETAMINOPHEN 325 MG PO TABS
ORAL_TABLET | ORAL | Status: AC
  Filled 2020-01-14: qty 2

## 2020-01-14 MED ORDER — DIPHENHYDRAMINE HCL 25 MG PO CAPS
25.0000 mg | ORAL_CAPSULE | Freq: Once | ORAL | Status: AC
  Administered 2020-01-14: 25 mg via ORAL

## 2020-01-14 MED ORDER — DEXAMETHASONE 4 MG PO TABS
12.0000 mg | ORAL_TABLET | Freq: Once | ORAL | Status: AC
  Administered 2020-01-14: 12 mg via ORAL

## 2020-01-14 MED ORDER — MONTELUKAST SODIUM 10 MG PO TABS
ORAL_TABLET | ORAL | Status: AC
  Filled 2020-01-14: qty 1

## 2020-01-14 MED ORDER — MONTELUKAST SODIUM 10 MG PO TABS
10.0000 mg | ORAL_TABLET | Freq: Once | ORAL | Status: AC
  Administered 2020-01-14: 10 mg via ORAL

## 2020-01-14 MED ORDER — DEXAMETHASONE 4 MG PO TABS
ORAL_TABLET | ORAL | Status: AC
  Filled 2020-01-14: qty 3

## 2020-01-14 MED ORDER — DARATUMUMAB-HYALURONIDASE-FIHJ 1800-30000 MG-UT/15ML ~~LOC~~ SOLN
1800.0000 mg | Freq: Once | SUBCUTANEOUS | Status: AC
  Administered 2020-01-14: 1800 mg via SUBCUTANEOUS
  Filled 2020-01-14: qty 15

## 2020-01-14 MED ORDER — DIPHENHYDRAMINE HCL 25 MG PO CAPS
ORAL_CAPSULE | ORAL | Status: AC
  Filled 2020-01-14: qty 1

## 2020-01-14 MED ORDER — ONDANSETRON HCL 8 MG PO TABS: ORAL_TABLET | ORAL | 11 refills | Status: DC

## 2020-01-14 MED ORDER — ACETAMINOPHEN 325 MG PO TABS
650.0000 mg | ORAL_TABLET | Freq: Once | ORAL | Status: AC
  Administered 2020-01-14: 650 mg via ORAL

## 2020-01-14 NOTE — Assessment & Plan Note (Signed)
I have reviewed recent myeloma panel with the patient She has achieved maximum response to therapy, currently at [facility] I recommend continue on Faspro only We will continue myeloma panel once a month She agreed with the plan of care She will continue Zometa every 3 months along with calcium and vitamin D supplement 

## 2020-01-14 NOTE — Assessment & Plan Note (Signed)
From my last visit, we discussed the role of physical therapy She was not inclined to proceed at that time She had recent fall Again, I recommend she proceed with physical therapy referral She will call me once she is ready

## 2020-01-14 NOTE — Assessment & Plan Note (Signed)
She has severe, recurrent headache We discussed possibility of referral to see neurologist She would like to discuss this with her husband first and will get back to me

## 2020-01-14 NOTE — Progress Notes (Signed)
[place] OFFICE PROGRESS NOTE  Patient Care Team: Josetta Huddle, MD as PCP - General (Internal Medicine) Ginette Pitman, MD as Consulting Physician (Hematology and Oncology) Hessie Dibble, MD as Consulting Physician (Hematology and Oncology)  ASSESSMENT & PLAN:  Multiple myeloma in remission [facility]) I have reviewed recent myeloma panel with the patient She has achieved maximum response to therapy, currently at [facility] I recommend continue on Faspro only We will continue myeloma panel once a month She agreed with the plan of care She will continue Zometa every 3 months along with calcium and vitamin D supplement  S/P bone marrow transplant ([place]) She has severe arm pain after recent shingles vaccine I recommend she contact her transplant coordinator to make sure that she has not been vaccinated against this after transplant  Weakness From my last visit, we discussed the role of physical therapy She was not inclined to proceed at that time She had recent fall Again, I recommend she proceed with physical therapy referral She will call me once she is ready  Headache disorder She has severe, recurrent headache We discussed possibility of referral to see neurologist She would like to discuss this with her husband first and will get back to me  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  Observe She started taking vitamin B12 supplement after her visit with her primary care doctor recently   No orders of the defined types were placed in this encounter.   All questions were answered. The patient knows to call the clinic with any problems, questions or concerns. The total time spent in the appointment was 30 minutes encounter with patients including review of chart and various tests results, discussions about plan of care and coordination of care plan   [name], MD 01/14/2020 10:01 AM  INTERVAL HISTORY: Please see below for problem oriented charting. She returns for chemotherapy and infusion She tolerated therapy well without infusion reaction Recently, she has wellness visit with her primary care doctor She is taking vitamin B12 supplementation per recommendation by primary care doctor along with shingles vaccine Her arm hurts a lot after her shingles vaccine She could not remember whether she had shingles vaccine after transplant She is wondering about bone density scan She slipped and fell this past week when she try to get up to her husband's truck She hurt her left shin with significant bruising She also sleeps poorly and have recurrent, severe headaches Tylenol is not helpful  SUMMARY OF ONCOLOGIC HISTORY: Oncology History Overview Note   M-protein 0.69 gm/dl IFIX - IgG, Kappa IgG - 868 IgA - 19 IgM - < 20 Kappa - 21 Lambda - 5.7  09/06/2014 - Bone marrow aspirate and biopsy:   Normocellular marrow for age (40%) with a small monoclonal plasma cell population (1% on aspirate). Karyotype 40, XX  FISH Negative for myeloma associated changes  09/12/2014 - PET/CT  Two regions that are concerning for disease, one adjacent/involving the left ninth rib and one in the marrow of the right femur, in this patient with history of plasmacytoma.    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   04/01/2015 Bone Marrow Transplant   She received melphalan chemotherapy on 03/31/2015 followed by autologous stem cell transplant the day after   04/03/2015 - 04/18/2015 Hospital Admission   The patient was admitted to the hospital at [place] for management related to complication from stem cell transplant. She had significant nausea requiring intravenous anti-emetics.   07/17/2015 -  Chemotherapy   She started maintenance Revlimid and monthly zometa, then every 3 months   06/01/2018 Imaging   DEXA scan showed bone density T score in femur -2.3   04/20/2019 PET scan   1. No FDG avid osseous lesions or mass identified to suggest metabolically active lesion of myeloma or plasmacytoma. 2. Small nodular density within the paravertebral right lower lobe exhibits mild to moderate increased uptake within SUV max of 3.38. This is indeterminate. Review of CT chest from 10/05/2018 shows a corresponding Lung nodule in this area measuring the same. Small pulmonary neoplasm cannot be excluded. 3. Indeterminate, focal area of increased uptake is identified within the thoracic canal. Indeterminate favored to represent benign physiologic CNS activity.     04/30/2019 -  Chemotherapy   The patient had daratumumab-hyaluronidase-fihj (DARZALEX FASPRO) 1800-30000 MG-UT/15ML chemo SQ injection 1,800 mg, 1,800 mg, Subcutaneous,  Once, 10 of 11 cycles Administration: 1,800 mg (04/30/2019), 1,800 mg (05/07/2019), 1,800 mg (05/14/2019), 1,800 mg (05/21/2019), 1,800 mg (05/28/2019), 1,800 mg (06/04/2019), 1,800 mg (06/11/2019),  1,800 mg (06/18/2019), 1,800 mg (06/25/2019), 1,800 mg (07/09/2019), 1,800 mg (10/15/2019), 1,800 mg (07/23/2019), 1,800 mg (08/06/2019), 1,800 mg (08/20/2019), 1,800 mg (09/03/2019), 1,800 mg (09/17/2019), 1,800 mg (10/01/2019), 1,800 mg (11/12/2019), 1,800 mg (12/10/2019)  for chemotherapy treatment.    04/30/2019 - 07/22/2019 Chemotherapy   The patient had bortezomib SQ (VELCADE) chemo injection 1.75 mg, 1.3 mg/m2 = 1.75 mg, Subcutaneous,  Once, 10 of 11 cycles Administration: 1.75 mg (04/30/2019), 1.75 mg (05/07/2019), 1.75 mg (05/14/2019), 1.75 mg (05/21/2019), 1.75 mg (05/28/2019), 1.75 mg (06/04/2019), 1.75 mg (06/11/2019), 1.75 mg (06/18/2019), 1.75 mg (06/25/2019), 1.75 mg (07/09/2019)  for chemotherapy treatment.      REVIEW OF SYSTEMS:   Constitutional: Denies fevers, chills or abnormal weight loss Eyes: Denies blurriness of vision Ears, nose, mouth, throat, and face: Denies mucositis or sore throat Respiratory: Denies cough, dyspnea or wheezes Cardiovascular: Denies palpitation, chest discomfort or lower extremity swelling Gastrointestinal:  Denies nausea, heartburn or change in bowel habits Skin: Denies abnormal skin rashes Lymphatics: Denies new lymphadenopathy Behavioral/Psych: Mood is stable, no new changes  All other systems were reviewed with the patient and are negative.  I have reviewed the past medical history, past surgical history, social history and family history with the patient and they are unchanged from previous note.  ALLERGIES:  is allergic to augmentin [amoxicillin-pot  clavulanate]; albuterol; codeine; ibuprofen; nsaids; and tolmetin.  MEDICATIONS:  Current Outpatient Medications  Medication Sig Dispense Refill  . famotidine (PEPCID) 20 MG tablet Take 20 mg by mouth daily.    Marland Kitchen triamterene-hydrochlorothiazide (MAXZIDE-25) 37.5-25 MG tablet Take 0.5 tablets by mouth daily.    Marland Kitchen acetaminophen (TYLENOL) 500 MG tablet Take 500-1,000 mg by mouth every 6 (six) hours as needed  for mild pain, moderate pain, fever or headache.    Marland Kitchen acyclovir (ZOVIRAX) 400 MG tablet Take 1 tablet (400 mg total) by mouth 2 (two) times daily. 180 tablet 3  . aspirin EC 81 MG tablet Take 81 mg by mouth daily with breakfast.    . calcium carbonate (OSCAL) 1500 (600 Ca) MG TABS tablet Take 1,500 mg by mouth daily with breakfast.    . Calcium Carbonate 500 MG CHEW Chew 500 mg by mouth as needed for indigestion.    . Cholecalciferol (VITAMIN D3) 2000 units capsule Take 2,000 Units by mouth daily.     . Multiple Vitamins-Minerals (CENTRUM SILVER PO) Take 1 tablet by mouth daily.     Marland Kitchen omeprazole (PRILOSEC) 20 MG capsule Take 1 capsule (20 mg total) by mouth daily. 90 capsule 11  . ondansetron (ZOFRAN) 8 MG tablet TAKE 1 TABLET(8 MG) BY MOUTH EVERY 8 HOURS AS NEEDED FOR NAUSEA OR VOMITING 60 tablet 11  . senna-docusate (SENOKOT-S) 8.6-50 MG tablet Take 1-2 tablets by mouth daily as needed for mild constipation or moderate constipation.     . sodium chloride (OCEAN) 0.65 % SOLN nasal spray Place 1 spray into both nostrils as needed for congestion.     No current facility-administered medications for this visit.   Facility-Administered Medications Ordered in Other Visits  Medication Dose Route Frequency Provider Last Rate Last Admin  . 0.9 %  sodium chloride infusion   Intravenous Once Alvy Bimler, Zahari Fazzino, MD      . daratumumab-hyaluronidase-fihj (DARZALEX FASPRO) 1800-30000 MG-UT/15ML chemo SQ injection 1,800 mg  1,800 mg Subcutaneous Once Alvy Bimler, Frady Taddeo, MD        PHYSICAL EXAMINATION: ECOG PERFORMANCE STATUS: 2 - Symptomatic, <50% confined to bed  Vitals:   01/14/20 0902  BP: 124/83  Pulse: 86  Resp: 18  Temp: 98.1 F (36.7 C)  SpO2: 100%   Filed Weights   01/14/20 0902  Weight: 117 lb 3.2 oz (53.2 kg)    GENERAL:alert, no distress and comfortable SKIN: Noted significant skin bruising on the left anterior shin area NEURO: alert & oriented x 3 with fluent speech, no focal motor/sensory  deficits  LABORATORY DATA:  I have reviewed the data as listed    Component Value Date/Time   NA 137 01/14/2020 0828   NA 143 07/14/2017 1245   K 3.9 01/14/2020 0828   K 3.3 (L) 07/14/2017 1245   CL 106 01/14/2020 0828   CO2 22 01/14/2020 0828   CO2 20 (L) 07/14/2017 1245   GLUCOSE 86 01/14/2020 0828   GLUCOSE 106 07/14/2017 1245   BUN 6 (L) 01/14/2020 0828   BUN 9.3 07/14/2017 1245   CREATININE 1.07 (H) 01/14/2020 0828   CREATININE 0.8 07/14/2017 1245   CALCIUM 9.2 01/14/2020 0828   CALCIUM 8.4 07/14/2017 1245   PROT 6.6 01/14/2020 0828   PROT 6.0 07/14/2017 1245   PROT 6.2 (L) 07/14/2017 1245   ALBUMIN 4.1 01/14/2020 0828   ALBUMIN 3.6 07/14/2017 1245   AST 21 01/14/2020 0828   AST 21 07/14/2017 1245   ALT 15 01/14/2020 0828   ALT 21  07/14/2017 1245   ALKPHOS 58 01/14/2020 0828   ALKPHOS 78 07/14/2017 1245   BILITOT 0.8 01/14/2020 0828   BILITOT 0.43 07/14/2017 1245   GFRNONAA 53 (L) 01/14/2020 0828   GFRAA >60 01/14/2020 0828    No results found for: SPEP, UPEP  Lab Results  Component Value Date   WBC 4.8 01/14/2020   NEUTROABS 2.4 01/14/2020   HGB 11.6 (L) 01/14/2020   HCT 35.6 (L) 01/14/2020   MCV 86.6 01/14/2020   PLT 291 01/14/2020      Chemistry      Component Value Date/Time   NA 137 01/14/2020 0828   NA 143 07/14/2017 1245   K 3.9 01/14/2020 0828   K 3.3 (L) 07/14/2017 1245   CL 106 01/14/2020 0828   CO2 22 01/14/2020 0828   CO2 20 (L) 07/14/2017 1245   BUN 6 (L) 01/14/2020 0828   BUN 9.3 07/14/2017 1245   CREATININE 1.07 (H) 01/14/2020 0828   CREATININE 0.8 07/14/2017 1245      Component Value Date/Time   CALCIUM 9.2 01/14/2020 0828   CALCIUM 8.4 07/14/2017 1245   ALKPHOS 58 01/14/2020 0828   ALKPHOS 78 07/14/2017 1245   AST 21 01/14/2020 0828   AST 21 07/14/2017 1245   ALT 15 01/14/2020 0828   ALT 21 07/14/2017 1245   BILITOT 0.8 01/14/2020 0828   BILITOT 0.43 07/14/2017 1245

## 2020-01-14 NOTE — Patient Instructions (Signed)
[facility] Discharge Instructions for Patients Receiving Chemotherapy  Today you received the following chemotherapy agents: Darzalex Faspro  To help prevent nausea and vomiting after your treatment, we encourage you to take your nausea medication as directed.    If you develop nausea and vomiting that is not controlled by your nausea medication, call the clinic.   BELOW ARE SYMPTOMS THAT SHOULD BE REPORTED IMMEDIATELY:  *FEVER GREATER THAN 100.5 F  *CHILLS WITH OR WITHOUT FEVER  NAUSEA AND VOMITING THAT IS NOT CONTROLLED WITH YOUR NAUSEA MEDICATION  *UNUSUAL SHORTNESS OF BREATH  *UNUSUAL BRUISING OR BLEEDING  TENDERNESS IN MOUTH AND THROAT WITH OR WITHOUT PRESENCE OF ULCERS  *URINARY PROBLEMS  *BOWEL PROBLEMS  UNUSUAL RASH Items with * indicate a potential emergency and should be followed up as soon as possible.  Feel free to call the clinic should you have any questions or concerns. The clinic phone number is [phone].  Please show the CHEMO ALERT CARD at check-in to the Emergency Department and triage nurse.   

## 2020-01-14 NOTE — Assessment & Plan Note (Signed)
She has severe arm pain after recent shingles vaccine I recommend she contact her transplant coordinator to make sure that she has not been vaccinated against this after transplant

## 2020-01-14 NOTE — Assessment & Plan Note (Signed)
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  Observe She started taking vitamin B12 supplement after her visit with her primary care doctor recently

## 2020-01-15 ENCOUNTER — Telehealth: Payer: Self-pay | Admitting: Hematology and Oncology

## 2020-01-15 LAB — KAPPA/LAMBDA LIGHT CHAINS
Kappa free light chain: 5.4 mg/L (ref 3.3–19.4)
Kappa, lambda light chain ratio: 3 — ABNORMAL HIGH (ref 0.26–1.65)
Lambda free light chains: 1.8 mg/L — ABNORMAL LOW (ref 5.7–26.3)

## 2020-01-15 NOTE — Telephone Encounter (Signed)
Scheduled per 6/7 sch message. Pt aware of appts.  

## 2020-01-17 LAB — MULTIPLE MYELOMA PANEL, SERUM
Albumin SerPl Elph-Mcnc: 4 g/dL (ref 2.9–4.4)
Albumin/Glob SerPl: 1.9 — ABNORMAL HIGH (ref 0.7–1.7)
Alpha 1: 0.2 g/dL (ref 0.0–0.4)
Alpha2 Glob SerPl Elph-Mcnc: 0.8 g/dL (ref 0.4–1.0)
B-Globulin SerPl Elph-Mcnc: 1 g/dL (ref 0.7–1.3)
Gamma Glob SerPl Elph-Mcnc: 0.2 g/dL — ABNORMAL LOW (ref 0.4–1.8)
Globulin, Total: 2.2 g/dL (ref 2.2–3.9)
IgA: <5 mg/dL — ABNORMAL LOW (ref 87–352)
IgG (Immunoglobin G), Serum: 307 mg/dL — ABNORMAL LOW (ref 586–1602)
IgM (Immunoglobulin M), Srm: <5 mg/dL — ABNORMAL LOW (ref 26–217)
Total Protein ELP: 6.2 g/dL (ref 6.0–8.5)

## 2020-02-18 ENCOUNTER — Other Ambulatory Visit: Payer: Self-pay | Admitting: Hematology and Oncology

## 2020-02-18 ENCOUNTER — Inpatient Hospital Stay: Payer: Medicare Other | Attending: Hematology and Oncology | Admitting: Hematology and Oncology

## 2020-02-18 ENCOUNTER — Other Ambulatory Visit: Payer: Medicare Other

## 2020-02-18 ENCOUNTER — Inpatient Hospital Stay: Payer: Medicare Other

## 2020-02-18 ENCOUNTER — Encounter: Payer: Self-pay | Admitting: Hematology and Oncology

## 2020-02-18 ENCOUNTER — Other Ambulatory Visit: Payer: Self-pay

## 2020-02-18 DIAGNOSIS — Z923 Personal history of irradiation: Secondary | ICD-10-CM | POA: Insufficient documentation

## 2020-02-18 DIAGNOSIS — Z7189 Other specified counseling: Secondary | ICD-10-CM

## 2020-02-18 DIAGNOSIS — C9001 Multiple myeloma in remission: Secondary | ICD-10-CM

## 2020-02-18 DIAGNOSIS — D638 Anemia in other chronic diseases classified elsewhere: Secondary | ICD-10-CM

## 2020-02-18 DIAGNOSIS — D6481 Anemia due to antineoplastic chemotherapy: Secondary | ICD-10-CM | POA: Diagnosis not present

## 2020-02-18 DIAGNOSIS — Z885 Allergy status to narcotic agent status: Secondary | ICD-10-CM | POA: Diagnosis not present

## 2020-02-18 DIAGNOSIS — Z880 Allergy status to penicillin: Secondary | ICD-10-CM | POA: Diagnosis not present

## 2020-02-18 DIAGNOSIS — Z79899 Other long term (current) drug therapy: Secondary | ICD-10-CM | POA: Insufficient documentation

## 2020-02-18 DIAGNOSIS — R911 Solitary pulmonary nodule: Secondary | ICD-10-CM | POA: Insufficient documentation

## 2020-02-18 DIAGNOSIS — Z886 Allergy status to analgesic agent status: Secondary | ICD-10-CM | POA: Insufficient documentation

## 2020-02-18 DIAGNOSIS — Z5112 Encounter for antineoplastic immunotherapy: Secondary | ICD-10-CM | POA: Diagnosis present

## 2020-02-18 DIAGNOSIS — Z9484 Stem cells transplant status: Secondary | ICD-10-CM | POA: Insufficient documentation

## 2020-02-18 DIAGNOSIS — N183 Chronic kidney disease, stage 3 unspecified: Secondary | ICD-10-CM | POA: Insufficient documentation

## 2020-02-18 DIAGNOSIS — M25569 Pain in unspecified knee: Secondary | ICD-10-CM | POA: Diagnosis not present

## 2020-02-18 DIAGNOSIS — Z881 Allergy status to other antibiotic agents status: Secondary | ICD-10-CM | POA: Insufficient documentation

## 2020-02-18 LAB — CBC WITH DIFFERENTIAL/PLATELET
Abs Immature Granulocytes: 0.01 10*3/uL (ref 0.00–0.07)
Basophils Absolute: 0.1 10*3/uL (ref 0.0–0.1)
Basophils Relative: 1 %
Eosinophils Absolute: 0.1 10*3/uL (ref 0.0–0.5)
Eosinophils Relative: 2 %
HCT: 35.7 % — ABNORMAL LOW (ref 36.0–46.0)
Hemoglobin: 11.8 g/dL — ABNORMAL LOW (ref 12.0–15.0)
Immature Granulocytes: 0 %
Lymphocytes Relative: 41 %
Lymphs Abs: 2.1 10*3/uL (ref 0.7–4.0)
MCH: 28.3 pg (ref 26.0–34.0)
MCHC: 33.1 g/dL (ref 30.0–36.0)
MCV: 85.6 fL (ref 80.0–100.0)
Monocytes Absolute: 0.5 10*3/uL (ref 0.1–1.0)
Monocytes Relative: 10 %
Neutro Abs: 2.3 10*3/uL (ref 1.7–7.7)
Neutrophils Relative %: 46 %
Platelets: 326 10*3/uL (ref 150–400)
RBC: 4.17 MIL/uL (ref 3.87–5.11)
RDW: 13.2 % (ref 11.5–15.5)
WBC: 5.1 10*3/uL (ref 4.0–10.5)
nRBC: 0 % (ref 0.0–0.2)

## 2020-02-18 LAB — COMPREHENSIVE METABOLIC PANEL
ALT: 16 U/L (ref 0–44)
AST: 23 U/L (ref 15–41)
Albumin: 4.2 g/dL (ref 3.5–5.0)
Alkaline Phosphatase: 62 U/L (ref 38–126)
Anion gap: 11 (ref 5–15)
BUN: 9 mg/dL (ref 8–23)
CO2: 24 mmol/L (ref 22–32)
Calcium: 9.5 mg/dL (ref 8.9–10.3)
Chloride: 107 mmol/L (ref 98–111)
Creatinine, Ser: 1.18 mg/dL — ABNORMAL HIGH (ref 0.44–1.00)
GFR calc Af Amer: 54 mL/min — ABNORMAL LOW (ref >60)
GFR calc non Af Amer: 47 mL/min — ABNORMAL LOW (ref >60)
Glucose, Bld: 82 mg/dL (ref 70–99)
Potassium: 3.4 mmol/L — ABNORMAL LOW (ref 3.5–5.1)
Sodium: 142 mmol/L (ref 135–145)
Total Bilirubin: 0.6 mg/dL (ref 0.3–1.2)
Total Protein: 6.9 g/dL (ref 6.5–8.1)

## 2020-02-18 MED ORDER — DARATUMUMAB-HYALURONIDASE-FIHJ 1800-30000 MG-UT/15ML ~~LOC~~ SOLN
1800.0000 mg | Freq: Once | SUBCUTANEOUS | Status: AC
  Administered 2020-02-18: 1800 mg via SUBCUTANEOUS
  Filled 2020-02-18: qty 15

## 2020-02-18 MED ORDER — MONTELUKAST SODIUM 10 MG PO TABS
ORAL_TABLET | ORAL | Status: AC
  Filled 2020-02-18: qty 1

## 2020-02-18 MED ORDER — MONTELUKAST SODIUM 10 MG PO TABS
10.0000 mg | ORAL_TABLET | Freq: Once | ORAL | Status: AC
  Administered 2020-02-18: 10 mg via ORAL

## 2020-02-18 MED ORDER — DEXAMETHASONE 4 MG PO TABS
ORAL_TABLET | ORAL | Status: AC
  Filled 2020-02-18: qty 3

## 2020-02-18 MED ORDER — DIPHENHYDRAMINE HCL 25 MG PO CAPS
ORAL_CAPSULE | ORAL | Status: AC
  Filled 2020-02-18: qty 1

## 2020-02-18 MED ORDER — DIPHENHYDRAMINE HCL 25 MG PO CAPS
25.0000 mg | ORAL_CAPSULE | Freq: Once | ORAL | Status: AC
  Administered 2020-02-18: 25 mg via ORAL

## 2020-02-18 MED ORDER — ACETAMINOPHEN 325 MG PO TABS
650.0000 mg | ORAL_TABLET | Freq: Once | ORAL | Status: AC
  Administered 2020-02-18: 650 mg via ORAL

## 2020-02-18 MED ORDER — DEXAMETHASONE 4 MG PO TABS
12.0000 mg | ORAL_TABLET | Freq: Once | ORAL | Status: AC
  Administered 2020-02-18: 12 mg via ORAL

## 2020-02-18 MED ORDER — ACETAMINOPHEN 325 MG PO TABS
ORAL_TABLET | ORAL | Status: AC
  Filled 2020-02-18: qty 2

## 2020-02-18 NOTE — Assessment & Plan Note (Signed)
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  Observe  

## 2020-02-18 NOTE — Progress Notes (Signed)
[place] OFFICE PROGRESS NOTE  Patient Care Team: Josetta Huddle, MD as PCP - General (Internal Medicine) Ginette Pitman, MD as Consulting Physician (Hematology and Oncology) Hessie Dibble, MD as Consulting Physician (Hematology and Oncology)  ASSESSMENT & PLAN:  Multiple myeloma in remission [facility]) I have reviewed recent myeloma panel with the patient She has achieved maximum response to therapy, currently at [facility] I recommend continue on Faspro only We will continue myeloma panel once a month She agreed with the plan of care She will continue Zometa every 3 months along with calcium and vitamin D supplement  Chronic kidney disease, stage III (moderate) She has stable chronic kidney disease I will continue reduced dose Zometa  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  Observe    No orders of the defined types were placed in this encounter.   All questions were answered. The patient knows to call the clinic with any problems, questions or concerns. The total time spent in the appointment was 20 minutes encounter with patients including review of chart and various tests results, discussions about plan of care and coordination of care plan   [name], MD 02/18/2020 12:17 PM  INTERVAL HISTORY: Please see below for problem oriented charting. She returns with her husband for further follow-up She is feeling well No recent side effects of treatment Her knee pain has somewhat improved No recent dental issues No recent infection, fever or chills  SUMMARY OF ONCOLOGIC HISTORY: Oncology History Overview Note   M-protein 0.69 gm/dl IFIX - IgG, Kappa IgG - 868 IgA - 19 IgM - < 20 Kappa - 21 Lambda - 5.7  09/06/2014 - Bone marrow aspirate and biopsy:   Normocellular marrow for age (40%) with a small monoclonal plasma cell population (1% on aspirate).  Karyotype 68, XX  FISH Negative for myeloma associated changes  09/12/2014 - PET/CT  Two regions that are concerning for disease, one adjacent/involving the left ninth rib and one in the marrow of the right femur, in this patient with history of plasmacytoma.    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   04/01/2015 Bone Marrow Transplant   She received melphalan chemotherapy on 03/31/2015 followed by autologous stem cell transplant the day after   04/03/2015 - 04/18/2015 Hospital Admission   The patient was admitted to the hospital at [place] for management  related to complication from stem cell transplant. She had significant nausea requiring intravenous anti-emetics.   07/17/2015 -  Chemotherapy   She started maintenance Revlimid and monthly zometa, then every 3 months   06/01/2018 Imaging   DEXA scan showed bone density T score in femur -2.3   04/20/2019 PET scan   1. No FDG avid osseous lesions or mass identified to suggest metabolically  active lesion of myeloma or plasmacytoma. 2. Small nodular density within the paravertebral right lower lobe exhibits mild to moderate increased uptake within SUV max of 3.38. This is indeterminate. Review of CT chest from 10/05/2018 shows a corresponding Lung nodule in this area measuring the same. Small pulmonary neoplasm cannot be excluded. 3. Indeterminate, focal area of increased uptake is identified within the thoracic canal. Indeterminate favored to represent benign physiologic CNS activity.     04/30/2019 -  Chemotherapy   The patient had dexamethasone (DECADRON) tablet 20 mg, 20 mg (100 % of original dose 20 mg), Oral,  Once, 11 of 13 cycles Dose modification: 20 mg (original dose 20 mg, Cycle 1), 12 mg (60 % of original dose 20 mg, Cycle 2, Reason: Dose Not Tolerated), 10 mg (50 % of original dose 20 mg, Cycle 4, Reason: Provider Judgment), 20 mg (original dose 20 mg, Cycle 1) Administration: 20 mg (05/07/2019), 20 mg (05/14/2019), 20 mg (05/21/2019), 20 mg (05/28/2019), 20 mg (06/04/2019), 12 mg (06/11/2019), 12 mg (06/18/2019), 12 mg (06/25/2019), 12 mg (07/09/2019), 10 mg (07/23/2019), 10 mg (08/06/2019), 10 mg (08/20/2019), 10 mg (09/03/2019), 10 mg (09/17/2019), 10 mg (10/01/2019), 12 mg (10/15/2019), 12 mg (11/12/2019), 12 mg (12/10/2019), 20 mg (04/30/2019), 12 mg (01/14/2020), 12 mg (02/18/2020) daratumumab-hyaluronidase-fihj (DARZALEX FASPRO) 1800-30000 MG-UT/15ML chemo SQ injection 1,800 mg, 1,800 mg, Subcutaneous,  Once, 11 of 13 cycles Administration: 1,800 mg (04/30/2019), 1,800 mg (05/07/2019), 1,800 mg (05/14/2019), 1,800 mg (05/21/2019), 1,800 mg (05/28/2019), 1,800 mg (06/04/2019), 1,800 mg (06/11/2019), 1,800 mg (06/18/2019), 1,800 mg (06/25/2019), 1,800 mg (07/09/2019), 1,800 mg (10/15/2019), 1,800 mg (07/23/2019), 1,800 mg (08/06/2019), 1,800 mg (08/20/2019), 1,800 mg (09/03/2019), 1,800 mg (09/17/2019), 1,800 mg (10/01/2019), 1,800 mg (11/12/2019), 1,800 mg (12/10/2019), 1,800 mg (01/14/2020), 1,800 mg  (02/18/2020)  for chemotherapy treatment.    04/30/2019 - 07/22/2019 Chemotherapy   The patient had bortezomib SQ (VELCADE) chemo injection 1.75 mg, 1.3 mg/m2 = 1.75 mg, Subcutaneous,  Once, 10 of 11 cycles Administration: 1.75 mg (04/30/2019), 1.75 mg (05/07/2019), 1.75 mg (05/14/2019), 1.75 mg (05/21/2019), 1.75 mg (05/28/2019), 1.75 mg (06/04/2019), 1.75 mg (06/11/2019), 1.75 mg (06/18/2019), 1.75 mg (06/25/2019), 1.75 mg (07/09/2019)  for chemotherapy treatment.      REVIEW OF SYSTEMS:   Constitutional: Denies fevers, chills or abnormal weight loss Eyes: Denies blurriness of vision Ears, nose, mouth, throat, and face: Denies mucositis or sore throat Respiratory: Denies cough, dyspnea or wheezes Cardiovascular: Denies palpitation, chest discomfort or lower extremity swelling Gastrointestinal:  Denies nausea, heartburn or change in bowel habits Skin: Denies abnormal skin rashes Lymphatics: Denies new lymphadenopathy or easy bruising Neurological:Denies numbness, tingling or new weaknesses Behavioral/Psych: Mood is stable, no new changes  All other systems were reviewed with the patient and are negative.  I have reviewed the past medical history, past surgical history, social history and family history with the patient and they are unchanged from previous note.  ALLERGIES:  is allergic to augmentin [amoxicillin-pot clavulanate], albuterol, codeine, ibuprofen, nsaids, and tolmetin.  MEDICATIONS:  Current Outpatient Medications  Medication Sig Dispense Refill  . vitamin B-12 (CYANOCOBALAMIN) 500 MCG tablet Take 500  mcg by mouth daily.    Marland Kitchen acetaminophen (TYLENOL) 500 MG tablet Take 500-1,000 mg by mouth every 6 (six) hours as needed for mild pain, moderate pain, fever or headache.    Marland Kitchen acyclovir (ZOVIRAX) 400 MG tablet Take 1 tablet (400 mg total) by mouth 2 (two) times daily. 180 tablet 3  . aspirin EC 81 MG tablet Take 81 mg by mouth daily with breakfast.    . calcium carbonate (OSCAL)  1500 (600 Ca) MG TABS tablet Take 1,500 mg by mouth daily with breakfast.    . Calcium Carbonate 500 MG CHEW Chew 500 mg by mouth as needed for indigestion.    . Cholecalciferol (VITAMIN D3) 2000 units capsule Take 2,000 Units by mouth daily.     . famotidine (PEPCID) 20 MG tablet Take 20 mg by mouth daily.    . Multiple Vitamins-Minerals (CENTRUM SILVER PO) Take 1 tablet by mouth daily.     Marland Kitchen omeprazole (PRILOSEC) 20 MG capsule Take 1 capsule (20 mg total) by mouth daily. 90 capsule 11  . ondansetron (ZOFRAN) 8 MG tablet TAKE 1 TABLET(8 MG) BY MOUTH EVERY 8 HOURS AS NEEDED FOR NAUSEA OR VOMITING 60 tablet 11  . senna-docusate (SENOKOT-S) 8.6-50 MG tablet Take 1-2 tablets by mouth daily as needed for mild constipation or moderate constipation.     . sodium chloride (OCEAN) 0.65 % SOLN nasal spray Place 1 spray into both nostrils as needed for congestion.    . triamterene-hydrochlorothiazide (MAXZIDE-25) 37.5-25 MG tablet Take 0.5 tablets by mouth daily.     No current facility-administered medications for this visit.   Facility-Administered Medications Ordered in Other Visits  Medication Dose Route Frequency Provider Last Rate Last Admin  . 0.9 %  sodium chloride infusion   Intravenous Once Alvy Bimler, Lemoyne Scarpati, MD        PHYSICAL EXAMINATION: ECOG PERFORMANCE STATUS: 1 - Symptomatic but completely ambulatory  Vitals:   02/18/20 0917  BP: 128/90  Pulse: 86  Resp: 18  Temp: 98.2 F (36.8 C)  SpO2: 100%   Filed Weights   02/18/20 0917  Weight: 115 lb 12.8 oz (52.5 kg)    GENERAL:alert, no distress and comfortable SKIN: skin color, texture, turgor are normal, no rashes or significant lesions EYES: normal, Conjunctiva are pink and non-injected, sclera clear OROPHARYNX:no exudate, no erythema and lips, buccal mucosa, and tongue normal  NECK: supple, thyroid normal size, non-tender, without nodularity LYMPH:  no palpable lymphadenopathy in the cervical, axillary or inguinal LUNGS: clear to  auscultation and percussion with normal breathing effort HEART: regular rate & rhythm and no murmurs and no lower extremity edema ABDOMEN:abdomen soft, non-tender and normal bowel sounds Musculoskeletal:no cyanosis of digits and no clubbing  NEURO: alert & oriented x 3 with fluent speech, no focal motor/sensory deficits  LABORATORY DATA:  I have reviewed the data as listed    Component Value Date/Time   NA 142 02/18/2020 0929   NA 143 07/14/2017 1245   K 3.4 (L) 02/18/2020 0929   K 3.3 (L) 07/14/2017 1245   CL 107 02/18/2020 0929   CO2 24 02/18/2020 0929   CO2 20 (L) 07/14/2017 1245   GLUCOSE 82 02/18/2020 0929   GLUCOSE 106 07/14/2017 1245   BUN 9 02/18/2020 0929   BUN 9.3 07/14/2017 1245   CREATININE 1.18 (H) 02/18/2020 0929   CREATININE 1.07 (H) 01/14/2020 0828   CREATININE 0.8 07/14/2017 1245   CALCIUM 9.5 02/18/2020 0929   CALCIUM 8.4 07/14/2017 1245   PROT 6.9  02/18/2020 0929   PROT 6.0 07/14/2017 1245   PROT 6.2 (L) 07/14/2017 1245   ALBUMIN 4.2 02/18/2020 0929   ALBUMIN 3.6 07/14/2017 1245   AST 23 02/18/2020 0929   AST 21 01/14/2020 0828   AST 21 07/14/2017 1245   ALT 16 02/18/2020 0929   ALT 15 01/14/2020 0828   ALT 21 07/14/2017 1245   ALKPHOS 62 02/18/2020 0929   ALKPHOS 78 07/14/2017 1245   BILITOT 0.6 02/18/2020 0929   BILITOT 0.8 01/14/2020 0828   BILITOT 0.43 07/14/2017 1245   GFRNONAA 47 (L) 02/18/2020 0929   GFRNONAA 53 (L) 01/14/2020 0828   GFRAA 54 (L) 02/18/2020 0929   GFRAA >60 01/14/2020 0828    No results found for: SPEP, UPEP  Lab Results  Component Value Date   WBC 5.1 02/18/2020   NEUTROABS 2.3 02/18/2020   HGB 11.8 (L) 02/18/2020   HCT 35.7 (L) 02/18/2020   MCV 85.6 02/18/2020   PLT 326 02/18/2020      Chemistry      Component Value Date/Time   NA 142 02/18/2020 0929   NA 143 07/14/2017 1245   K 3.4 (L) 02/18/2020 0929   K 3.3 (L) 07/14/2017 1245   CL 107 02/18/2020 0929   CO2 24 02/18/2020 0929   CO2 20 (L) 07/14/2017  1245   BUN 9 02/18/2020 0929   BUN 9.3 07/14/2017 1245   CREATININE 1.18 (H) 02/18/2020 0929   CREATININE 1.07 (H) 01/14/2020 0828   CREATININE 0.8 07/14/2017 1245      Component Value Date/Time   CALCIUM 9.5 02/18/2020 0929   CALCIUM 8.4 07/14/2017 1245   ALKPHOS 62 02/18/2020 0929   ALKPHOS 78 07/14/2017 1245   AST 23 02/18/2020 0929   AST 21 01/14/2020 0828   AST 21 07/14/2017 1245   ALT 16 02/18/2020 0929   ALT 15 01/14/2020 0828   ALT 21 07/14/2017 1245   BILITOT 0.6 02/18/2020 0929   BILITOT 0.8 01/14/2020 0828   BILITOT 0.43 07/14/2017 1245

## 2020-02-18 NOTE — Assessment & Plan Note (Signed)
I have reviewed recent myeloma panel with the patient She has achieved maximum response to therapy, currently at [facility] I recommend continue on Faspro only We will continue myeloma panel once a month She agreed with the plan of care She will continue Zometa every 3 months along with calcium and vitamin D supplement 

## 2020-02-18 NOTE — Assessment & Plan Note (Signed)
She has stable chronic kidney disease I will continue reduced dose Zometa

## 2020-02-19 LAB — KAPPA/LAMBDA LIGHT CHAINS
Kappa free light chain: 6.7 mg/L (ref 3.3–19.4)
Kappa, lambda light chain ratio: 3.53 — ABNORMAL HIGH (ref 0.26–1.65)
Lambda free light chains: 1.9 mg/L — ABNORMAL LOW (ref 5.7–26.3)

## 2020-02-20 LAB — MULTIPLE MYELOMA PANEL, SERUM
Albumin SerPl Elph-Mcnc: 4.2 g/dL (ref 2.9–4.4)
Albumin/Glob SerPl: 2 — ABNORMAL HIGH (ref 0.7–1.7)
Alpha 1: 0.2 g/dL (ref 0.0–0.4)
Alpha2 Glob SerPl Elph-Mcnc: 0.8 g/dL (ref 0.4–1.0)
B-Globulin SerPl Elph-Mcnc: 1 g/dL (ref 0.7–1.3)
Gamma Glob SerPl Elph-Mcnc: 0.2 g/dL — ABNORMAL LOW (ref 0.4–1.8)
Globulin, Total: 2.2 g/dL (ref 2.2–3.9)
IgA: <5 mg/dL — ABNORMAL LOW (ref 87–352)
IgG (Immunoglobin G), Serum: 313 mg/dL — ABNORMAL LOW (ref 586–1602)
IgM (Immunoglobulin M), Srm: <5 mg/dL — ABNORMAL LOW (ref 26–217)
M Protein SerPl Elph-Mcnc: 0.1 g/dL — ABNORMAL HIGH
Total Protein ELP: 6.4 g/dL (ref 6.0–8.5)

## 2020-03-17 ENCOUNTER — Other Ambulatory Visit: Payer: Self-pay | Admitting: Hematology and Oncology

## 2020-03-17 ENCOUNTER — Inpatient Hospital Stay: Payer: Medicare Other | Attending: Hematology and Oncology

## 2020-03-17 ENCOUNTER — Encounter: Payer: Self-pay | Admitting: Hematology and Oncology

## 2020-03-17 ENCOUNTER — Inpatient Hospital Stay (HOSPITAL_BASED_OUTPATIENT_CLINIC_OR_DEPARTMENT_OTHER): Payer: Medicare Other | Admitting: Hematology and Oncology

## 2020-03-17 ENCOUNTER — Inpatient Hospital Stay: Payer: Medicare Other

## 2020-03-17 ENCOUNTER — Other Ambulatory Visit: Payer: Self-pay

## 2020-03-17 VITALS — BP 113/86 | HR 81

## 2020-03-17 DIAGNOSIS — Z923 Personal history of irradiation: Secondary | ICD-10-CM | POA: Diagnosis not present

## 2020-03-17 DIAGNOSIS — C9001 Multiple myeloma in remission: Secondary | ICD-10-CM

## 2020-03-17 DIAGNOSIS — T451X5A Adverse effect of antineoplastic and immunosuppressive drugs, initial encounter: Secondary | ICD-10-CM | POA: Diagnosis not present

## 2020-03-17 DIAGNOSIS — R42 Dizziness and giddiness: Secondary | ICD-10-CM

## 2020-03-17 DIAGNOSIS — Z79899 Other long term (current) drug therapy: Secondary | ICD-10-CM | POA: Diagnosis not present

## 2020-03-17 DIAGNOSIS — Z885 Allergy status to narcotic agent status: Secondary | ICD-10-CM | POA: Diagnosis not present

## 2020-03-17 DIAGNOSIS — Z881 Allergy status to other antibiotic agents status: Secondary | ICD-10-CM | POA: Insufficient documentation

## 2020-03-17 DIAGNOSIS — N183 Chronic kidney disease, stage 3 unspecified: Secondary | ICD-10-CM

## 2020-03-17 DIAGNOSIS — Z880 Allergy status to penicillin: Secondary | ICD-10-CM | POA: Diagnosis not present

## 2020-03-17 DIAGNOSIS — G620 Drug-induced polyneuropathy: Secondary | ICD-10-CM

## 2020-03-17 DIAGNOSIS — R911 Solitary pulmonary nodule: Secondary | ICD-10-CM | POA: Diagnosis not present

## 2020-03-17 DIAGNOSIS — Z886 Allergy status to analgesic agent status: Secondary | ICD-10-CM | POA: Diagnosis not present

## 2020-03-17 DIAGNOSIS — Z5112 Encounter for antineoplastic immunotherapy: Secondary | ICD-10-CM | POA: Diagnosis present

## 2020-03-17 DIAGNOSIS — Z9221 Personal history of antineoplastic chemotherapy: Secondary | ICD-10-CM | POA: Insufficient documentation

## 2020-03-17 DIAGNOSIS — Z7189 Other specified counseling: Secondary | ICD-10-CM

## 2020-03-17 DIAGNOSIS — Z9481 Bone marrow transplant status: Secondary | ICD-10-CM

## 2020-03-17 LAB — COMPREHENSIVE METABOLIC PANEL
ALT: 15 U/L (ref 0–44)
AST: 21 U/L (ref 15–41)
Albumin: 4 g/dL (ref 3.5–5.0)
Alkaline Phosphatase: 64 U/L (ref 38–126)
Anion gap: 10 (ref 5–15)
BUN: 11 mg/dL (ref 8–23)
CO2: 21 mmol/L — ABNORMAL LOW (ref 22–32)
Calcium: 9.7 mg/dL (ref 8.9–10.3)
Chloride: 108 mmol/L (ref 98–111)
Creatinine, Ser: 1.16 mg/dL — ABNORMAL HIGH (ref 0.44–1.00)
GFR calc Af Amer: 55 mL/min — ABNORMAL LOW (ref >60)
GFR calc non Af Amer: 48 mL/min — ABNORMAL LOW (ref >60)
Glucose, Bld: 95 mg/dL (ref 70–99)
Potassium: 3.7 mmol/L (ref 3.5–5.1)
Sodium: 139 mmol/L (ref 135–145)
Total Bilirubin: 0.6 mg/dL (ref 0.3–1.2)
Total Protein: 6.5 g/dL (ref 6.5–8.1)

## 2020-03-17 LAB — CBC WITH DIFFERENTIAL/PLATELET
Abs Immature Granulocytes: 0.01 10*3/uL (ref 0.00–0.07)
Basophils Absolute: 0.1 10*3/uL (ref 0.0–0.1)
Basophils Relative: 2 %
Eosinophils Absolute: 0.1 10*3/uL (ref 0.0–0.5)
Eosinophils Relative: 3 %
HCT: 35.9 % — ABNORMAL LOW (ref 36.0–46.0)
Hemoglobin: 12 g/dL (ref 12.0–15.0)
Immature Granulocytes: 0 %
Lymphocytes Relative: 37 %
Lymphs Abs: 1.8 10*3/uL (ref 0.7–4.0)
MCH: 27.7 pg (ref 26.0–34.0)
MCHC: 33.4 g/dL (ref 30.0–36.0)
MCV: 82.9 fL (ref 80.0–100.0)
Monocytes Absolute: 0.5 10*3/uL (ref 0.1–1.0)
Monocytes Relative: 10 %
Neutro Abs: 2.4 10*3/uL (ref 1.7–7.7)
Neutrophils Relative %: 48 %
Platelets: 328 10*3/uL (ref 150–400)
RBC: 4.33 MIL/uL (ref 3.87–5.11)
RDW: 13.1 % (ref 11.5–15.5)
WBC: 4.8 10*3/uL (ref 4.0–10.5)
nRBC: 0 % (ref 0.0–0.2)

## 2020-03-17 MED ORDER — DIPHENHYDRAMINE HCL 25 MG PO CAPS
ORAL_CAPSULE | ORAL | Status: AC
  Filled 2020-03-17: qty 1

## 2020-03-17 MED ORDER — SODIUM CHLORIDE 0.9 % IV SOLN
Freq: Once | INTRAVENOUS | Status: AC
  Filled 2020-03-17: qty 250

## 2020-03-17 MED ORDER — DEXAMETHASONE 4 MG PO TABS
ORAL_TABLET | ORAL | Status: AC
  Filled 2020-03-17: qty 3

## 2020-03-17 MED ORDER — DARATUMUMAB-HYALURONIDASE-FIHJ 1800-30000 MG-UT/15ML ~~LOC~~ SOLN
1800.0000 mg | Freq: Once | SUBCUTANEOUS | Status: AC
  Administered 2020-03-17: 1800 mg via SUBCUTANEOUS
  Filled 2020-03-17: qty 15

## 2020-03-17 MED ORDER — ACETAMINOPHEN 325 MG PO TABS
ORAL_TABLET | ORAL | Status: AC
  Filled 2020-03-17: qty 2

## 2020-03-17 MED ORDER — ACETAMINOPHEN 325 MG PO TABS
650.0000 mg | ORAL_TABLET | Freq: Once | ORAL | Status: AC
  Administered 2020-03-17: 650 mg via ORAL

## 2020-03-17 MED ORDER — ZOLEDRONIC ACID 4 MG/5ML IV CONC
3.0000 mg | Freq: Once | INTRAVENOUS | Status: AC
  Administered 2020-03-17: 3 mg via INTRAVENOUS
  Filled 2020-03-17: qty 3.75

## 2020-03-17 MED ORDER — DEXAMETHASONE 4 MG PO TABS
12.0000 mg | ORAL_TABLET | Freq: Once | ORAL | Status: AC
  Administered 2020-03-17: 12 mg via ORAL

## 2020-03-17 MED ORDER — DIPHENHYDRAMINE HCL 25 MG PO CAPS
25.0000 mg | ORAL_CAPSULE | Freq: Once | ORAL | Status: AC
  Administered 2020-03-17: 25 mg via ORAL

## 2020-03-17 MED ORDER — MONTELUKAST SODIUM 10 MG PO TABS
ORAL_TABLET | ORAL | Status: AC
  Filled 2020-03-17: qty 1

## 2020-03-17 MED ORDER — MONTELUKAST SODIUM 10 MG PO TABS
10.0000 mg | ORAL_TABLET | Freq: Once | ORAL | Status: AC
  Administered 2020-03-17: 10 mg via ORAL

## 2020-03-17 MED ORDER — ZOLEDRONIC ACID 4 MG/100ML IV SOLN
INTRAVENOUS | Status: AC
  Filled 2020-03-17: qty 100

## 2020-03-17 NOTE — Assessment & Plan Note (Signed)
She has chronic intermittent dizziness I suspect a component of dehydration I encouraged her to increase oral fluid intake

## 2020-03-17 NOTE — Assessment & Plan Note (Signed)
Her neuropathy is stable She has recent severe pain after the injection which I do not believe is related to her neuropathy Continue conservative approach

## 2020-03-17 NOTE — Assessment & Plan Note (Signed)
I have reviewed recent myeloma panel with the patient She has achieved maximum response to therapy, currently at [facility] I recommend continue on Faspro only We will continue myeloma panel once a month She agreed with the plan of care She will continue Zometa every 3 months along with calcium and vitamin D supplement 

## 2020-03-17 NOTE — Patient Instructions (Addendum)
[name] Discharge Instructions for Patients Receiving Chemotherapy  Today you received the following chemotherapy agents: Darzalex Faspro  To help prevent nausea and vomiting after your treatment, we encourage you to take your nausea medication as directed.   If you develop nausea and vomiting that is not controlled by your nausea medication, call the clinic.   BELOW ARE SYMPTOMS THAT SHOULD BE REPORTED IMMEDIATELY:  *FEVER GREATER THAN 100.5 F  *CHILLS WITH OR WITHOUT FEVER  NAUSEA AND VOMITING THAT IS NOT CONTROLLED WITH YOUR NAUSEA MEDICATION  *UNUSUAL SHORTNESS OF BREATH  *UNUSUAL BRUISING OR BLEEDING  TENDERNESS IN MOUTH AND THROAT WITH OR WITHOUT PRESENCE OF ULCERS  *URINARY PROBLEMS  *BOWEL PROBLEMS  UNUSUAL RASH Items with * indicate a potential emergency and should be followed up as soon as possible.  Feel free to call the clinic should you have any questions or concerns. The clinic phone number is [phone].  Please show the CHEMO ALERT CARD at check-in to the Emergency Department and triage nurse.  Zoledronic Acid injection (Hypercalcemia, Oncology) What is this medicine? ZOLEDRONIC ACID (ZOE le dron ik AS id) lowers the amount of calcium loss from bone. It is used to treat too much calcium in your blood from cancer. It is also used to prevent complications of cancer that has spread to the bone. This medicine may be used for other purposes; ask your health care provider or pharmacist if you have questions. COMMON BRAND NAME(S): Zometa What should I tell my health care provider before I take this medicine? They need to know if you have any of these conditions:  aspirin-sensitive asthma  cancer, especially if you are receiving medicines used to treat cancer  dental disease or wear dentures  infection  kidney disease  receiving corticosteroids like dexamethasone or prednisone  an unusual or allergic reaction to zoledronic acid,  other medicines, foods, dyes, or preservatives  pregnant or trying to get pregnant  breast-feeding How should I use this medicine? This medicine is for infusion into a vein. It is given by a health care professional in a hospital or clinic setting. Talk to your pediatrician regarding the use of this medicine in children. Special care may be needed. Overdosage: If you think you have taken too much of this medicine contact a poison control center or emergency room at once. NOTE: This medicine is only for you. Do not share this medicine with others. What if I miss a dose? It is important not to miss your dose. Call your doctor or health care professional if you are unable to keep an appointment. What may interact with this medicine?  certain antibiotics given by injection  NSAIDs, medicines for pain and inflammation, like ibuprofen or naproxen  some diuretics like bumetanide, furosemide  teriparatide  thalidomide This list may not describe all possible interactions. Give your health care provider a list of all the medicines, herbs, non-prescription drugs, or dietary supplements you use. Also tell them if you smoke, drink alcohol, or use illegal drugs. Some items may interact with your medicine. What should I watch for while using this medicine? Visit your doctor or health care professional for regular checkups. It may be some time before you see the benefit from this medicine. Do not stop taking your medicine unless your doctor tells you to. Your doctor may order blood tests or other tests to see how you are doing. Women should inform their doctor if they wish to become pregnant or think they might be   be pregnant. There is a potential for serious side effects to an unborn child. Talk to your health care professional or pharmacist for more information. You should make sure that you get enough calcium and vitamin D while you are taking this medicine. Discuss the foods you eat and the vitamins you  take with your health care professional. Some people who take this medicine have severe bone, joint, and/or muscle pain. This medicine may also increase your risk for jaw problems or a broken thigh bone. Tell your doctor right away if you have severe pain in your jaw, bones, joints, or muscles. Tell your doctor if you have any pain that does not go away or that gets worse. Tell your dentist and dental surgeon that you are taking this medicine. You should not have major dental surgery while on this medicine. See your dentist to have a dental exam and fix any dental problems before starting this medicine. Take good care of your teeth while on this medicine. Make sure you see your dentist for regular follow-up appointments. What side effects may I notice from receiving this medicine? Side effects that you should report to your doctor or health care professional as soon as possible:  allergic reactions like skin rash, itching or hives, swelling of the face, lips, or tongue  anxiety, confusion, or depression  breathing problems  changes in vision  eye pain  feeling faint or lightheaded, falls  jaw pain, especially after dental work  mouth sores  muscle cramps, stiffness, or weakness  redness, blistering, peeling or loosening of the skin, including inside the mouth  trouble passing urine or change in the amount of urine Side effects that usually do not require medical attention (report to your doctor or health care professional if they continue or are bothersome):  bone, joint, or muscle pain  constipation  diarrhea  fever  hair loss  irritation at site where injected  loss of appetite  nausea, vomiting  stomach upset  trouble sleeping  trouble swallowing  weak or tired This list may not describe all possible side effects. Call your doctor for medical advice about side effects. You may report side effects to FDA at 1-800-FDA-1088. Where should I keep my medicine? This  drug is given in a hospital or clinic and will not be stored at home. NOTE: This sheet is a summary. It may not cover all possible information. If you have questions about this medicine, talk to your doctor, pharmacist, or health care provider.  2020 [employer]vier/Gold Standard (2013-12-22 14:19:39)

## 2020-03-17 NOTE — Progress Notes (Signed)
[place] OFFICE PROGRESS NOTE  Patient Care Team: Josetta Huddle, MD as PCP - General (Internal Medicine) Ginette Pitman, MD as Consulting Physician (Hematology and Oncology) Hessie Dibble, MD as Consulting Physician (Hematology and Oncology)  ASSESSMENT & PLAN:  Multiple myeloma in remission [facility]) I have reviewed recent myeloma panel with the patient She has achieved maximum response to therapy, currently at [facility] I recommend continue on Faspro only We will continue myeloma panel once a month She agreed with the plan of care She will continue Zometa every 3 months along with calcium and vitamin D supplement  Chronic kidney disease, stage III (moderate) She has mild elevated serum creatinine recently I suspect a component of dehydration I recommend the patient to drink more fluids  Dizziness She has chronic intermittent dizziness I suspect a component of dehydration I encouraged her to increase oral fluid intake  Neuropathy due to chemotherapeutic drug Her neuropathy is stable She has recent severe pain after the injection which I do not believe is related to her neuropathy Continue conservative approach   No orders of the defined types were placed in this encounter.   All questions were answered. The patient knows to call the clinic with any problems, questions or concerns. The total time spent in the appointment was 20 minutes encounter with patients including review of chart and various tests results, discussions about plan of care and coordination of care plan   [name], MD 03/17/2020 9:41 AM  INTERVAL HISTORY: Please see below for problem oriented charting. She returns for chemotherapy and follow-up According to the patient, she had a slightly different injection with her last dose of chemo and that caused severe pain afterwards She has to take some pain medicine as needed Denies worsening peripheral neuropathy She has occasional  dizziness No recent infection, fever or chills Denies recent fall  SUMMARY OF ONCOLOGIC HISTORY: Oncology History Overview Note   M-protein 0.69 gm/dl IFIX - IgG, Kappa IgG - 868 IgA - 19 IgM - < 20 Kappa - 21 Lambda - 5.7  09/06/2014 - Bone marrow aspirate and biopsy:   Normocellular marrow for age (40%) with a small monoclonal plasma cell population (1% on aspirate). Karyotype 2, XX  FISH Negative for myeloma associated changes  09/12/2014 - PET/CT  Two regions that are concerning for disease, one adjacent/involving the left ninth rib and one in the marrow of the right femur, in this patient with history of plasmacytoma.    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   04/01/2015 Bone Marrow Transplant   She received melphalan chemotherapy on 03/31/2015 followed by autologous stem cell transplant the day after   04/03/2015 - 04/18/2015 Hospital Admission   The patient was admitted to the hospital at [facility] for management related to  complication from stem cell transplant. She had significant nausea requiring intravenous anti-emetics.   07/17/2015 -  Chemotherapy   She started maintenance Revlimid and monthly zometa, then every 3 months   06/01/2018 Imaging   DEXA scan showed bone density T score in femur -2.3   04/20/2019 PET scan   1. No FDG avid osseous lesions or mass identified to suggest metabolically active lesion of myeloma or plasmacytoma. 2. Small nodular density within the paravertebral right lower lobe exhibits mild to moderate increased uptake within SUV max of 3.38. This is indeterminate. Review of CT chest from 10/05/2018 shows a corresponding Lung nodule in this area measuring the same. Small pulmonary neoplasm cannot be excluded. 3. Indeterminate, focal area of increased uptake is identified within the thoracic canal. Indeterminate favored to represent benign physiologic CNS activity.     04/30/2019 -  Chemotherapy   The patient had dexamethasone (DECADRON) tablet 20 mg, 20 mg (100 % of original dose 20 mg), Oral,  Once, 11 of 13 cycles Dose modification: 20 mg (original dose 20 mg, Cycle 1), 12 mg (60 % of original dose 20 mg, Cycle 2, Reason: Dose Not Tolerated), 10 mg (50 % of original dose 20 mg, Cycle 4, Reason: Provider Judgment), 20 mg (original dose 20 mg, Cycle 1) Administration: 20 mg (05/07/2019), 20 mg (05/14/2019), 20 mg (05/21/2019), 20 mg (05/28/2019), 20 mg (06/04/2019), 12 mg (06/11/2019), 12 mg (06/18/2019), 12 mg (06/25/2019), 12 mg (07/09/2019), 10 mg (07/23/2019), 10 mg (08/06/2019), 10 mg (08/20/2019), 10 mg (09/03/2019), 10 mg (09/17/2019), 10 mg (10/01/2019), 12 mg (10/15/2019), 12 mg (11/12/2019), 12 mg (12/10/2019), 20 mg (04/30/2019), 12 mg (01/14/2020), 12 mg (02/18/2020) daratumumab-hyaluronidase-fihj (DARZALEX FASPRO) 1800-30000 MG-UT/15ML chemo SQ injection 1,800 mg, 1,800 mg, Subcutaneous,  Once, 11 of 13 cycles Administration: 1,800 mg (04/30/2019), 1,800 mg (05/07/2019), 1,800 mg (05/14/2019), 1,800  mg (05/21/2019), 1,800 mg (05/28/2019), 1,800 mg (06/04/2019), 1,800 mg (06/11/2019), 1,800 mg (06/18/2019), 1,800 mg (06/25/2019), 1,800 mg (07/09/2019), 1,800 mg (10/15/2019), 1,800 mg (07/23/2019), 1,800 mg (08/06/2019), 1,800 mg (08/20/2019), 1,800 mg (09/03/2019), 1,800 mg (09/17/2019), 1,800 mg (10/01/2019), 1,800 mg (11/12/2019), 1,800 mg (12/10/2019), 1,800 mg (01/14/2020), 1,800 mg (02/18/2020)  for chemotherapy treatment.    04/30/2019 - 07/22/2019 Chemotherapy   The patient had bortezomib SQ (VELCADE) chemo injection 1.75 mg, 1.3 mg/m2 = 1.75 mg, Subcutaneous,  Once, 10 of 11 cycles Administration: 1.75 mg (04/30/2019), 1.75 mg (05/07/2019), 1.75 mg (05/14/2019), 1.75 mg (05/21/2019), 1.75 mg (05/28/2019), 1.75 mg (06/04/2019), 1.75 mg (06/11/2019), 1.75 mg (06/18/2019), 1.75 mg (06/25/2019), 1.75 mg (07/09/2019)  for chemotherapy treatment.      REVIEW OF SYSTEMS:   Constitutional: Denies fevers, chills or abnormal weight loss Eyes: Denies blurriness of vision Ears, nose, mouth, throat, and face: Denies mucositis or sore throat Respiratory: Denies cough, dyspnea or wheezes Cardiovascular: Denies palpitation, chest discomfort or lower extremity swelling Gastrointestinal:  Denies nausea, heartburn or change in bowel habits Skin: Denies abnormal skin rashes Behavioral/Psych: Mood is stable, no new changes  All other systems were reviewed with the patient and are negative.  I have reviewed the past medical history, past surgical history, social history and family history with the patient and they  are unchanged from previous note.  ALLERGIES:  is allergic to augmentin [amoxicillin-pot clavulanate], albuterol, codeine, ibuprofen, nsaids, and tolmetin.  MEDICATIONS:  Current Outpatient Medications  Medication Sig Dispense Refill  . acetaminophen (TYLENOL) 500 MG tablet Take 500-1,000 mg by mouth every 6 (six) hours as needed for mild pain, moderate pain, fever or headache.    Marland Kitchen acyclovir (ZOVIRAX) 400 MG  tablet Take 1 tablet (400 mg total) by mouth 2 (two) times daily. 180 tablet 3  . aspirin EC 81 MG tablet Take 81 mg by mouth daily with breakfast.    . calcium carbonate (OSCAL) 1500 (600 Ca) MG TABS tablet Take 1,500 mg by mouth daily with breakfast.    . Calcium Carbonate 500 MG CHEW Chew 500 mg by mouth as needed for indigestion.    . Cholecalciferol (VITAMIN D3) 2000 units capsule Take 2,000 Units by mouth daily.     . famotidine (PEPCID) 20 MG tablet Take 20 mg by mouth daily.    . Multiple Vitamins-Minerals (CENTRUM SILVER PO) Take 1 tablet by mouth daily.     Marland Kitchen omeprazole (PRILOSEC) 20 MG capsule Take 1 capsule (20 mg total) by mouth daily. 90 capsule 11  . ondansetron (ZOFRAN) 8 MG tablet TAKE 1 TABLET(8 MG) BY MOUTH EVERY 8 HOURS AS NEEDED FOR NAUSEA OR VOMITING 60 tablet 11  . senna-docusate (SENOKOT-S) 8.6-50 MG tablet Take 1-2 tablets by mouth daily as needed for mild constipation or moderate constipation.     . sodium chloride (OCEAN) 0.65 % SOLN nasal spray Place 1 spray into both nostrils as needed for congestion.    . triamterene-hydrochlorothiazide (MAXZIDE-25) 37.5-25 MG tablet Take 0.5 tablets by mouth daily.    . vitamin B-12 (CYANOCOBALAMIN) 500 MCG tablet Take 500 mcg by mouth daily.     No current facility-administered medications for this visit.   Facility-Administered Medications Ordered in Other Visits  Medication Dose Route Frequency Provider Last Rate Last Admin  . 0.9 %  sodium chloride infusion   Intravenous Once [name], MD        PHYSICAL EXAMINATION: ECOG PERFORMANCE STATUS: 1 - Symptomatic but completely ambulatory  Vitals:   03/17/20 0919  Pulse: (!) 102  Resp: 18  Temp: 98.3 F (36.8 C)  SpO2: 100%   Filed Weights   03/17/20 0919  Weight: 115 lb (52.2 kg)    GENERAL:alert, no distress and comfortable SKIN: skin color, texture, turgor are normal, no rashes or significant lesions.  I did not see any bruising on her at the injection  site EYES: normal, Conjunctiva are pink and non-injected, sclera clear OROPHARYNX:no exudate, no erythema and lips, buccal mucosa, and tongue normal  NECK: supple, thyroid normal size, non-tender, without nodularity LYMPH:  no palpable lymphadenopathy in the cervical, axillary or inguinal LUNGS: clear to auscultation and percussion with normal breathing effort HEART: regular rate & rhythm and no murmurs and no lower extremity edema ABDOMEN:abdomen soft, non-tender and normal bowel sounds Musculoskeletal:no cyanosis of digits and no clubbing  NEURO: alert & oriented x 3 with fluent speech, no focal motor/sensory deficits  LABORATORY DATA:  I have reviewed the data as listed    Component Value Date/Time   NA 142 02/18/2020 0929   NA 143 07/14/2017 1245   K 3.4 (L) 02/18/2020 0929   K 3.3 (L) 07/14/2017 1245   CL 107 02/18/2020 0929   CO2 24 02/18/2020 0929   CO2 20 (L) 07/14/2017 1245   GLUCOSE 82 02/18/2020 0929   GLUCOSE 106  07/14/2017 1245   BUN 9 02/18/2020 0929   BUN 9.3 07/14/2017 1245   CREATININE 1.18 (H) 02/18/2020 0929   CREATININE 1.07 (H) 01/14/2020 0828   CREATININE 0.8 07/14/2017 1245   CALCIUM 9.5 02/18/2020 0929   CALCIUM 8.4 07/14/2017 1245   PROT 6.9 02/18/2020 0929   PROT 6.0 07/14/2017 1245   PROT 6.2 (L) 07/14/2017 1245   ALBUMIN 4.2 02/18/2020 0929   ALBUMIN 3.6 07/14/2017 1245   AST 23 02/18/2020 0929   AST 21 01/14/2020 0828   AST 21 07/14/2017 1245   ALT 16 02/18/2020 0929   ALT 15 01/14/2020 0828   ALT 21 07/14/2017 1245   ALKPHOS 62 02/18/2020 0929   ALKPHOS 78 07/14/2017 1245   BILITOT 0.6 02/18/2020 0929   BILITOT 0.8 01/14/2020 0828   BILITOT 0.43 07/14/2017 1245   GFRNONAA 47 (L) 02/18/2020 0929   GFRNONAA 53 (L) 01/14/2020 0828   GFRAA 54 (L) 02/18/2020 0929   GFRAA >60 01/14/2020 0828    No results found for: SPEP, UPEP  Lab Results  Component Value Date   WBC 4.8 03/17/2020   NEUTROABS 2.4 03/17/2020   HGB 12.0 03/17/2020    HCT 35.9 (L) 03/17/2020   MCV 82.9 03/17/2020   PLT 328 03/17/2020      Chemistry      Component Value Date/Time   NA 142 02/18/2020 0929   NA 143 07/14/2017 1245   K 3.4 (L) 02/18/2020 0929   K 3.3 (L) 07/14/2017 1245   CL 107 02/18/2020 0929   CO2 24 02/18/2020 0929   CO2 20 (L) 07/14/2017 1245   BUN 9 02/18/2020 0929   BUN 9.3 07/14/2017 1245   CREATININE 1.18 (H) 02/18/2020 0929   CREATININE 1.07 (H) 01/14/2020 0828   CREATININE 0.8 07/14/2017 1245      Component Value Date/Time   CALCIUM 9.5 02/18/2020 0929   CALCIUM 8.4 07/14/2017 1245   ALKPHOS 62 02/18/2020 0929   ALKPHOS 78 07/14/2017 1245   AST 23 02/18/2020 0929   AST 21 01/14/2020 0828   AST 21 07/14/2017 1245   ALT 16 02/18/2020 0929   ALT 15 01/14/2020 0828   ALT 21 07/14/2017 1245   BILITOT 0.6 02/18/2020 0929   BILITOT 0.8 01/14/2020 0828   BILITOT 0.43 07/14/2017 1245

## 2020-03-17 NOTE — Assessment & Plan Note (Signed)
She has mild elevated serum creatinine recently I suspect a component of dehydration I recommend the patient to drink more fluids 

## 2020-03-18 LAB — KAPPA/LAMBDA LIGHT CHAINS
Kappa free light chain: 6.5 mg/L (ref 3.3–19.4)
Kappa, lambda light chain ratio: 3.42 — ABNORMAL HIGH (ref 0.26–1.65)
Lambda free light chains: 1.9 mg/L — ABNORMAL LOW (ref 5.7–26.3)

## 2020-03-20 LAB — MULTIPLE MYELOMA PANEL, SERUM
Albumin SerPl Elph-Mcnc: 3.5 g/dL (ref 2.9–4.4)
Albumin/Glob SerPl: 1.4 (ref 0.7–1.7)
Alpha 1: 0.2 g/dL (ref 0.0–0.4)
Alpha2 Glob SerPl Elph-Mcnc: 0.9 g/dL (ref 0.4–1.0)
B-Globulin SerPl Elph-Mcnc: 1.1 g/dL (ref 0.7–1.3)
Gamma Glob SerPl Elph-Mcnc: 0.3 g/dL — ABNORMAL LOW (ref 0.4–1.8)
Globulin, Total: 2.6 g/dL (ref 2.2–3.9)
IgA: 6 mg/dL — ABNORMAL LOW (ref 87–352)
IgG (Immunoglobin G), Serum: 314 mg/dL — ABNORMAL LOW (ref 586–1602)
IgM (Immunoglobulin M), Srm: <5 mg/dL — ABNORMAL LOW (ref 26–217)
Total Protein ELP: 6.1 g/dL (ref 6.0–8.5)

## 2020-04-21 ENCOUNTER — Inpatient Hospital Stay (HOSPITAL_BASED_OUTPATIENT_CLINIC_OR_DEPARTMENT_OTHER): Payer: Medicare Other | Admitting: Hematology and Oncology

## 2020-04-21 ENCOUNTER — Inpatient Hospital Stay: Payer: Medicare Other

## 2020-04-21 ENCOUNTER — Other Ambulatory Visit: Payer: Self-pay

## 2020-04-21 ENCOUNTER — Inpatient Hospital Stay: Payer: Medicare Other | Attending: Hematology and Oncology

## 2020-04-21 ENCOUNTER — Encounter: Payer: Self-pay | Admitting: Hematology and Oncology

## 2020-04-21 DIAGNOSIS — Z299 Encounter for prophylactic measures, unspecified: Secondary | ICD-10-CM | POA: Diagnosis not present

## 2020-04-21 DIAGNOSIS — Z885 Allergy status to narcotic agent status: Secondary | ICD-10-CM | POA: Insufficient documentation

## 2020-04-21 DIAGNOSIS — D6481 Anemia due to antineoplastic chemotherapy: Secondary | ICD-10-CM | POA: Diagnosis not present

## 2020-04-21 DIAGNOSIS — J984 Other disorders of lung: Secondary | ICD-10-CM | POA: Insufficient documentation

## 2020-04-21 DIAGNOSIS — Z7189 Other specified counseling: Secondary | ICD-10-CM

## 2020-04-21 DIAGNOSIS — Z880 Allergy status to penicillin: Secondary | ICD-10-CM | POA: Diagnosis not present

## 2020-04-21 DIAGNOSIS — Z923 Personal history of irradiation: Secondary | ICD-10-CM | POA: Diagnosis not present

## 2020-04-21 DIAGNOSIS — D638 Anemia in other chronic diseases classified elsewhere: Secondary | ICD-10-CM

## 2020-04-21 DIAGNOSIS — Z79899 Other long term (current) drug therapy: Secondary | ICD-10-CM | POA: Insufficient documentation

## 2020-04-21 DIAGNOSIS — Z23 Encounter for immunization: Secondary | ICD-10-CM | POA: Insufficient documentation

## 2020-04-21 DIAGNOSIS — Z886 Allergy status to analgesic agent status: Secondary | ICD-10-CM | POA: Diagnosis not present

## 2020-04-21 DIAGNOSIS — Z881 Allergy status to other antibiotic agents status: Secondary | ICD-10-CM | POA: Diagnosis not present

## 2020-04-21 DIAGNOSIS — C9001 Multiple myeloma in remission: Secondary | ICD-10-CM

## 2020-04-21 DIAGNOSIS — Z9484 Stem cells transplant status: Secondary | ICD-10-CM | POA: Diagnosis not present

## 2020-04-21 DIAGNOSIS — T451X5A Adverse effect of antineoplastic and immunosuppressive drugs, initial encounter: Secondary | ICD-10-CM | POA: Diagnosis not present

## 2020-04-21 DIAGNOSIS — Z9221 Personal history of antineoplastic chemotherapy: Secondary | ICD-10-CM | POA: Insufficient documentation

## 2020-04-21 DIAGNOSIS — Z5112 Encounter for antineoplastic immunotherapy: Secondary | ICD-10-CM | POA: Insufficient documentation

## 2020-04-21 LAB — COMPREHENSIVE METABOLIC PANEL
ALT: 12 U/L (ref 0–44)
AST: 21 U/L (ref 15–41)
Albumin: 4.1 g/dL (ref 3.5–5.0)
Alkaline Phosphatase: 63 U/L (ref 38–126)
Anion gap: 9 (ref 5–15)
BUN: 10 mg/dL (ref 8–23)
CO2: 25 mmol/L (ref 22–32)
Calcium: 9.9 mg/dL (ref 8.9–10.3)
Chloride: 106 mmol/L (ref 98–111)
Creatinine, Ser: 1.13 mg/dL — ABNORMAL HIGH (ref 0.44–1.00)
GFR calc Af Amer: 57 mL/min — ABNORMAL LOW (ref >60)
GFR calc non Af Amer: 49 mL/min — ABNORMAL LOW (ref >60)
Glucose, Bld: 87 mg/dL (ref 70–99)
Potassium: 3.5 mmol/L (ref 3.5–5.1)
Sodium: 140 mmol/L (ref 135–145)
Total Bilirubin: 0.6 mg/dL (ref 0.3–1.2)
Total Protein: 6.7 g/dL (ref 6.5–8.1)

## 2020-04-21 LAB — CBC WITH DIFFERENTIAL/PLATELET
Abs Immature Granulocytes: 0.01 10*3/uL (ref 0.00–0.07)
Basophils Absolute: 0.1 10*3/uL (ref 0.0–0.1)
Basophils Relative: 1 %
Eosinophils Absolute: 0.1 10*3/uL (ref 0.0–0.5)
Eosinophils Relative: 3 %
HCT: 34.1 % — ABNORMAL LOW (ref 36.0–46.0)
Hemoglobin: 11.2 g/dL — ABNORMAL LOW (ref 12.0–15.0)
Immature Granulocytes: 0 %
Lymphocytes Relative: 40 %
Lymphs Abs: 1.9 10*3/uL (ref 0.7–4.0)
MCH: 27.5 pg (ref 26.0–34.0)
MCHC: 32.8 g/dL (ref 30.0–36.0)
MCV: 83.8 fL (ref 80.0–100.0)
Monocytes Absolute: 0.5 10*3/uL (ref 0.1–1.0)
Monocytes Relative: 11 %
Neutro Abs: 2.1 10*3/uL (ref 1.7–7.7)
Neutrophils Relative %: 45 %
Platelets: 290 10*3/uL (ref 150–400)
RBC: 4.07 MIL/uL (ref 3.87–5.11)
RDW: 13.2 % (ref 11.5–15.5)
WBC: 4.6 10*3/uL (ref 4.0–10.5)
nRBC: 0 % (ref 0.0–0.2)

## 2020-04-21 MED ORDER — INFLUENZA VAC A&B SA ADJ QUAD 0.5 ML IM PRSY
PREFILLED_SYRINGE | INTRAMUSCULAR | Status: AC
  Filled 2020-04-21: qty 0.5

## 2020-04-21 MED ORDER — ACETAMINOPHEN 325 MG PO TABS
650.0000 mg | ORAL_TABLET | Freq: Once | ORAL | Status: AC
  Administered 2020-04-21: 650 mg via ORAL

## 2020-04-21 MED ORDER — MONTELUKAST SODIUM 10 MG PO TABS
10.0000 mg | ORAL_TABLET | Freq: Once | ORAL | Status: AC
  Administered 2020-04-21: 10 mg via ORAL

## 2020-04-21 MED ORDER — DIPHENHYDRAMINE HCL 25 MG PO CAPS
ORAL_CAPSULE | ORAL | Status: AC
  Filled 2020-04-21: qty 1

## 2020-04-21 MED ORDER — DIPHENHYDRAMINE HCL 25 MG PO CAPS
25.0000 mg | ORAL_CAPSULE | Freq: Once | ORAL | Status: AC
  Administered 2020-04-21: 25 mg via ORAL

## 2020-04-21 MED ORDER — DEXAMETHASONE 4 MG PO TABS
12.0000 mg | ORAL_TABLET | Freq: Once | ORAL | Status: AC
  Administered 2020-04-21: 12 mg via ORAL

## 2020-04-21 MED ORDER — DEXAMETHASONE 4 MG PO TABS
ORAL_TABLET | ORAL | Status: AC
  Filled 2020-04-21: qty 3

## 2020-04-21 MED ORDER — DARATUMUMAB-HYALURONIDASE-FIHJ 1800-30000 MG-UT/15ML ~~LOC~~ SOLN
1800.0000 mg | Freq: Once | SUBCUTANEOUS | Status: AC
  Administered 2020-04-21: 1800 mg via SUBCUTANEOUS
  Filled 2020-04-21: qty 15

## 2020-04-21 MED ORDER — ACETAMINOPHEN 325 MG PO TABS
ORAL_TABLET | ORAL | Status: AC
  Filled 2020-04-21: qty 2

## 2020-04-21 MED ORDER — INFLUENZA VAC A&B SA ADJ QUAD 0.5 ML IM PRSY
0.5000 mL | PREFILLED_SYRINGE | Freq: Once | INTRAMUSCULAR | Status: AC
  Administered 2020-04-21: 0.5 mL via INTRAMUSCULAR

## 2020-04-21 MED ORDER — MONTELUKAST SODIUM 10 MG PO TABS
ORAL_TABLET | ORAL | Status: AC
  Filled 2020-04-21: qty 1

## 2020-04-21 NOTE — Assessment & Plan Note (Signed)
We discussed the importance of preventive care and reviewed the vaccination programs. She does not have any prior allergic reactions to influenza vaccination. She agrees to proceed with influenza vaccination today and we will administer it today at the clinic.  

## 2020-04-21 NOTE — Patient Instructions (Addendum)
[place] Discharge Instructions for Patients Receiving Chemotherapy  Today you received the following chemotherapy agents: Darzalex Faspro  To help prevent nausea and vomiting after your treatment, we encourage you to take your nausea medication as directed.    If you develop nausea and vomiting that is not controlled by your nausea medication, call the clinic.   BELOW ARE SYMPTOMS THAT SHOULD BE REPORTED IMMEDIATELY:  *FEVER GREATER THAN 100.5 F  *CHILLS WITH OR WITHOUT FEVER  NAUSEA AND VOMITING THAT IS NOT CONTROLLED WITH YOUR NAUSEA MEDICATION  *UNUSUAL SHORTNESS OF BREATH  *UNUSUAL BRUISING OR BLEEDING  TENDERNESS IN MOUTH AND THROAT WITH OR WITHOUT PRESENCE OF ULCERS  *URINARY PROBLEMS  *BOWEL PROBLEMS  UNUSUAL RASH Items with * indicate a potential emergency and should be followed up as soon as possible.  Feel free to call the clinic should you have any questions or concerns. The clinic phone number is [phone].  Please show the [facility] at check-in to the Emergency Department and triage nurse.   Influenza Virus Vaccine injection What is this medicine? INFLUENZA VIRUS VACCINE (in floo EN zuh VAHY ruhs vak SEEN) helps to reduce the risk of getting influenza also known as the flu. The vaccine only helps protect you against some strains of the flu. This medicine may be used for other purposes; ask your health care provider or pharmacist if you have questions. COMMON BRAND NAME(S): Afluria, Afluria Quadrivalent, Agriflu, Alfuria, FLUAD, Fluarix, Fluarix Quadrivalent, Flublok, Flublok Quadrivalent, FLUCELVAX, FLUCELVAX Quadrivalent, Flulaval, Flulaval Quadrivalent, Fluvirin, Fluzone, Fluzone High-Dose, Fluzone Intradermal, Fluzone Quadrivalent What should I tell my health care provider before I take this medicine? They need to know if you have any of these conditions:  bleeding disorder like hemophilia  fever or infection  Guillain-Barre  syndrome or other neurological problems  immune system problems  infection with the human immunodeficiency virus (HIV) or AIDS  low blood platelet counts  multiple sclerosis  an unusual or allergic reaction to influenza virus vaccine, latex, other medicines, foods, dyes, or preservatives. Different brands of vaccines contain different allergens. Some may contain latex or eggs. Talk to your doctor about your allergies to make sure that you get the right vaccine.  pregnant or trying to get pregnant  breast-feeding How should I use this medicine? This vaccine is for injection into a muscle or under the skin. It is given by a health care professional. A copy of Vaccine Information Statements will be given before each vaccination. Read this sheet carefully each time. The sheet may change frequently. Talk to your healthcare provider to see which vaccines are right for you. Some vaccines should not be used in all age groups. Overdosage: If you think you have taken too much of this medicine contact a poison control center or emergency room at once. NOTE: This medicine is only for you. Do not share this medicine with others. What if I miss a dose? This does not apply. What may interact with this medicine?  chemotherapy or radiation therapy  medicines that lower your immune system like etanercept, anakinra, infliximab, and adalimumab  medicines that treat or prevent blood clots like warfarin  phenytoin  steroid medicines like prednisone or cortisone  theophylline  vaccines This list may not describe all possible interactions. Give your health care provider a list of all the medicines, herbs, non-prescription drugs, or dietary supplements you use. Also tell them if you smoke, drink alcohol, or use illegal drugs. Some items may interact with your  medicine. What should I watch for while using this medicine? Report any side effects that do not go away within 3 days to your doctor or health  care professional. Call your health care provider if any unusual symptoms occur within 6 weeks of receiving this vaccine. You may still catch the flu, but the illness is not usually as bad. You cannot get the flu from the vaccine. The vaccine will not protect against colds or other illnesses that may cause fever. The vaccine is needed every year. What side effects may I notice from receiving this medicine? Side effects that you should report to your doctor or health care professional as soon as possible:  allergic reactions like skin rash, itching or hives, swelling of the face, lips, or tongue Side effects that usually do not require medical attention (report to your doctor or health care professional if they continue or are bothersome):  fever  headache  muscle aches and pains  pain, tenderness, redness, or swelling at the injection site  tiredness This list may not describe all possible side effects. Call your doctor for medical advice about side effects. You may report side effects to FDA at 1-800-FDA-1088. Where should I keep my medicine? The vaccine will be given by a health care professional in a clinic, pharmacy, doctor's office, or other health care setting. You will not be given vaccine doses to store at home. NOTE: This sheet is a summary. It may not cover all possible information. If you have questions about this medicine, talk to your doctor, pharmacist, or health care provider.  2020 Elsevier/Gold Standard (2018-06-20 08:45:43)

## 2020-04-21 NOTE — Progress Notes (Signed)
[facility] OFFICE PROGRESS NOTE  Patient Care Team: Josetta Huddle, MD as PCP - General (Internal Medicine) Ginette Pitman, MD as Consulting Physician (Hematology and Oncology) Hessie Dibble, MD as Consulting Physician (Hematology and Oncology)  ASSESSMENT & PLAN:  Multiple myeloma in remission [facility]) I have reviewed recent myeloma panel with the patient She has achieved maximum response to therapy, currently at [facility] I recommend continue on Faspro only We will continue myeloma panel once a month She agreed with the plan of care She will continue Zometa every 3 months along with calcium and vitamin D supplement  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  Observe   Preventive measure We discussed the importance of preventive care and reviewed the vaccination programs. She does not have any prior allergic reactions to influenza vaccination. She agrees to proceed with influenza vaccination today and we will administer it today at the clinic.    No orders of the defined types were placed in this encounter.   All questions were answered. The patient knows to call the clinic with any problems, questions or concerns. The total time spent in the appointment was 20 minutes encounter with patients including review of chart and various tests results, discussions about plan of care and coordination of care plan   [name], MD 04/21/2020 10:32 AM  INTERVAL HISTORY: Please see below for problem oriented charting. She returns for treatment and follow-up She is doing well No side effects from chemotherapy No recent dental issues No new bone pain No recent infection, fever or chills  SUMMARY OF ONCOLOGIC HISTORY: Oncology History Overview Note   M-protein 0.69 gm/dl IFIX - IgG, Kappa IgG - 868 IgA - 19 IgM - < 20 Kappa - 21 Lambda - 5.7  09/06/2014 - Bone marrow  aspirate and biopsy:   Normocellular marrow for age (40%) with a small monoclonal plasma cell population (1% on aspirate). Karyotype 28, XX  FISH Negative for myeloma associated changes  09/12/2014 - PET/CT  Two regions that are concerning for disease, one adjacent/involving the left ninth rib and one in the marrow of the right femur, in this patient with history of plasmacytoma.    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   04/01/2015 Bone Marrow Transplant   She received melphalan chemotherapy on 03/31/2015 followed by autologous stem cell transplant the day after  04/03/2015 - 04/18/2015 Hospital Admission   The patient was admitted to the hospital at [facility] for management related to complication from stem cell transplant. She had significant nausea requiring intravenous anti-emetics.   07/17/2015 -  Chemotherapy   She started maintenance Revlimid and monthly zometa, then every 3 months   06/01/2018 Imaging   DEXA scan showed bone density T  score in femur -2.3   04/20/2019 PET scan   1. No FDG avid osseous lesions or mass identified to suggest metabolically active lesion of myeloma or plasmacytoma. 2. Small nodular density within the paravertebral right lower lobe exhibits mild to moderate increased uptake within SUV max of 3.38. This is indeterminate. Review of CT chest from 10/05/2018 shows a corresponding Lung nodule in this area measuring the same. Small pulmonary neoplasm cannot be excluded. 3. Indeterminate, focal area of increased uptake is identified within the thoracic canal. Indeterminate favored to represent benign physiologic CNS activity.     04/30/2019 -  Chemotherapy   The patient had dexamethasone (DECADRON) tablet 20 mg, 20 mg (100 % of original dose 20 mg), Oral,  Once, 13 of 14 cycles Dose modification: 20 mg (original dose 20 mg, Cycle 1), 12 mg (60 % of original dose 20 mg, Cycle 2, Reason: Dose Not Tolerated), 10 mg (50 % of original dose 20 mg, Cycle 4, Reason: Provider Judgment), 20 mg (original dose 20 mg, Cycle 1) Administration: 20 mg (05/07/2019), 20 mg (05/14/2019), 20 mg (05/21/2019), 20 mg (05/28/2019), 20 mg (06/04/2019), 12 mg (06/11/2019), 12 mg (06/18/2019), 12 mg (06/25/2019), 12 mg (07/09/2019), 10 mg (07/23/2019), 10 mg (08/06/2019), 10 mg (08/20/2019), 10 mg (09/03/2019), 10 mg (09/17/2019), 10 mg (10/01/2019), 12 mg (10/15/2019), 12 mg (11/12/2019), 12 mg (12/10/2019), 20 mg (04/30/2019), 12 mg (01/14/2020), 12 mg (02/18/2020), 12 mg (03/17/2020) daratumumab-hyaluronidase-fihj (DARZALEX FASPRO) 1800-30000 MG-UT/15ML chemo SQ injection 1,800 mg, 1,800 mg, Subcutaneous,  Once, 13 of 14 cycles Administration: 1,800 mg (04/30/2019), 1,800 mg (05/07/2019), 1,800 mg (05/14/2019), 1,800 mg (05/21/2019), 1,800 mg (05/28/2019), 1,800 mg (06/04/2019), 1,800 mg (06/11/2019), 1,800 mg (06/18/2019), 1,800 mg (06/25/2019), 1,800 mg (07/09/2019), 1,800 mg (10/15/2019), 1,800 mg (07/23/2019), 1,800 mg (08/06/2019), 1,800 mg (08/20/2019), 1,800  mg (09/03/2019), 1,800 mg (09/17/2019), 1,800 mg (10/01/2019), 1,800 mg (11/12/2019), 1,800 mg (12/10/2019), 1,800 mg (01/14/2020), 1,800 mg (02/18/2020), 1,800 mg (03/17/2020)  for chemotherapy treatment.    04/30/2019 - 07/22/2019 Chemotherapy   The patient had bortezomib SQ (VELCADE) chemo injection 1.75 mg, 1.3 mg/m2 = 1.75 mg, Subcutaneous,  Once, 10 of 11 cycles Administration: 1.75 mg (04/30/2019), 1.75 mg (05/07/2019), 1.75 mg (05/14/2019), 1.75 mg (05/21/2019), 1.75 mg (05/28/2019), 1.75 mg (06/04/2019), 1.75 mg (06/11/2019), 1.75 mg (06/18/2019), 1.75 mg (06/25/2019), 1.75 mg (07/09/2019)  for chemotherapy treatment.      REVIEW OF SYSTEMS:   Constitutional: Denies fevers, chills or abnormal weight loss Eyes: Denies blurriness of vision Ears, nose, mouth, throat, and face: Denies mucositis or sore throat Respiratory: Denies cough, dyspnea or wheezes Cardiovascular: Denies palpitation, chest discomfort or lower extremity swelling Gastrointestinal:  Denies nausea, heartburn or change in bowel habits Skin: Denies abnormal skin rashes Lymphatics: Denies new lymphadenopathy or easy bruising Neurological:Denies numbness, tingling or new weaknesses Behavioral/Psych: Mood is stable, no new changes  All other systems were reviewed with the patient and are negative.  I have reviewed the past medical history, past surgical history, social history and family history with the patient and they are unchanged from previous note.  ALLERGIES:  is allergic to augmentin [amoxicillin-pot clavulanate], albuterol, codeine,  ibuprofen, nsaids, and tolmetin.  MEDICATIONS:  Current Outpatient Medications  Medication Sig Dispense Refill  . acetaminophen (TYLENOL) 500 MG tablet Take 500-1,000 mg by mouth every 6 (six) hours as needed for mild pain, moderate pain, fever or headache.    Marland Kitchen acyclovir (ZOVIRAX) 400 MG tablet Take 1 tablet (400 mg total) by mouth 2 (two) times daily. 180 tablet 3  . aspirin EC 81 MG tablet Take  81 mg by mouth daily with breakfast.    . calcium carbonate (OSCAL) 1500 (600 Ca) MG TABS tablet Take 1,500 mg by mouth daily with breakfast.    . Calcium Carbonate 500 MG CHEW Chew 500 mg by mouth as needed for indigestion.    . Cholecalciferol (VITAMIN D3) 2000 units capsule Take 2,000 Units by mouth daily.     . famotidine (PEPCID) 20 MG tablet Take 20 mg by mouth daily.    . Multiple Vitamins-Minerals (CENTRUM SILVER PO) Take 1 tablet by mouth daily.     Marland Kitchen omeprazole (PRILOSEC) 20 MG capsule Take 1 capsule (20 mg total) by mouth daily. 90 capsule 11  . ondansetron (ZOFRAN) 8 MG tablet TAKE 1 TABLET(8 MG) BY MOUTH EVERY 8 HOURS AS NEEDED FOR NAUSEA OR VOMITING 60 tablet 11  . senna-docusate (SENOKOT-S) 8.6-50 MG tablet Take 1-2 tablets by mouth daily as needed for mild constipation or moderate constipation.     . sodium chloride (OCEAN) 0.65 % SOLN nasal spray Place 1 spray into both nostrils as needed for congestion.    . triamterene-hydrochlorothiazide (MAXZIDE-25) 37.5-25 MG tablet Take 0.5 tablets by mouth daily.    . vitamin B-12 (CYANOCOBALAMIN) 500 MCG tablet Take 500 mcg by mouth daily.     No current facility-administered medications for this visit.   Facility-Administered Medications Ordered in Other Visits  Medication Dose Route Frequency Provider Last Rate Last Admin  . 0.9 %  sodium chloride infusion   Intravenous Once Alvy Bimler, Arran Fessel, MD      . daratumumab-hyaluronidase-fihj (DARZALEX FASPRO) 1800-30000 MG-UT/15ML chemo SQ injection 1,800 mg  1,800 mg Subcutaneous Once Alvy Bimler, Amor Hyle, MD      . influenza vaccine adjuvanted (FLUAD) injection 0.5 mL  0.5 mL Intramuscular Once Alvy Bimler, Ariq Khamis, MD        PHYSICAL EXAMINATION: ECOG PERFORMANCE STATUS: 1 - Symptomatic but completely ambulatory  Vitals:   04/21/20 0848  BP: (!) 148/78  Pulse: 78  Resp: 18  Temp: 97.6 F (36.4 C)  SpO2: 100%   Filed Weights   04/21/20 0848  Weight: 115 lb (52.2 kg)    GENERAL:alert, no distress  and comfortable SKIN: skin color, texture, turgor are normal, no rashes or significant lesions EYES: normal, Conjunctiva are pink and non-injected, sclera clear OROPHARYNX:no exudate, no erythema and lips, buccal mucosa, and tongue normal  NECK: supple, thyroid normal size, non-tender, without nodularity LYMPH:  no palpable lymphadenopathy in the cervical, axillary or inguinal LUNGS: clear to auscultation and percussion with normal breathing effort HEART: regular rate & rhythm and no murmurs and no lower extremity edema ABDOMEN:abdomen soft, non-tender and normal bowel sounds Musculoskeletal:no cyanosis of digits and no clubbing  NEURO: alert & oriented x 3 with fluent speech, no focal motor/sensory deficits  LABORATORY DATA:  I have reviewed the data as listed    Component Value Date/Time   NA 140 04/21/2020 0817   NA 143 07/14/2017 1245   K 3.5 04/21/2020 0817   K 3.3 (L) 07/14/2017 1245   CL 106 04/21/2020 0817   CO2 25 04/21/2020  0817   CO2 20 (L) 07/14/2017 1245   GLUCOSE 87 04/21/2020 0817   GLUCOSE 106 07/14/2017 1245   BUN 10 04/21/2020 0817   BUN 9.3 07/14/2017 1245   CREATININE 1.13 (H) 04/21/2020 0817   CREATININE 1.07 (H) 01/14/2020 0828   CREATININE 0.8 07/14/2017 1245   CALCIUM 9.9 04/21/2020 0817   CALCIUM 8.4 07/14/2017 1245   PROT 6.7 04/21/2020 0817   PROT 6.0 07/14/2017 1245   PROT 6.2 (L) 07/14/2017 1245   ALBUMIN 4.1 04/21/2020 0817   ALBUMIN 3.6 07/14/2017 1245   AST 21 04/21/2020 0817   AST 21 01/14/2020 0828   AST 21 07/14/2017 1245   ALT 12 04/21/2020 0817   ALT 15 01/14/2020 0828   ALT 21 07/14/2017 1245   ALKPHOS 63 04/21/2020 0817   ALKPHOS 78 07/14/2017 1245   BILITOT 0.6 04/21/2020 0817   BILITOT 0.8 01/14/2020 0828   BILITOT 0.43 07/14/2017 1245   GFRNONAA 49 (L) 04/21/2020 0817   GFRNONAA 53 (L) 01/14/2020 0828   GFRAA 57 (L) 04/21/2020 0817   GFRAA >60 01/14/2020 0828    No results found for: SPEP, UPEP  Lab Results  Component  Value Date   WBC 4.6 04/21/2020   NEUTROABS 2.1 04/21/2020   HGB 11.2 (L) 04/21/2020   HCT 34.1 (L) 04/21/2020   MCV 83.8 04/21/2020   PLT 290 04/21/2020      Chemistry      Component Value Date/Time   NA 140 04/21/2020 0817   NA 143 07/14/2017 1245   K 3.5 04/21/2020 0817   K 3.3 (L) 07/14/2017 1245   CL 106 04/21/2020 0817   CO2 25 04/21/2020 0817   CO2 20 (L) 07/14/2017 1245   BUN 10 04/21/2020 0817   BUN 9.3 07/14/2017 1245   CREATININE 1.13 (H) 04/21/2020 0817   CREATININE 1.07 (H) 01/14/2020 0828   CREATININE 0.8 07/14/2017 1245      Component Value Date/Time   CALCIUM 9.9 04/21/2020 0817   CALCIUM 8.4 07/14/2017 1245   ALKPHOS 63 04/21/2020 0817   ALKPHOS 78 07/14/2017 1245   AST 21 04/21/2020 0817   AST 21 01/14/2020 0828   AST 21 07/14/2017 1245   ALT 12 04/21/2020 0817   ALT 15 01/14/2020 0828   ALT 21 07/14/2017 1245   BILITOT 0.6 04/21/2020 0817   BILITOT 0.8 01/14/2020 0828   BILITOT 0.43 07/14/2017 1245

## 2020-04-21 NOTE — Assessment & Plan Note (Signed)
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  Observe  

## 2020-04-21 NOTE — Assessment & Plan Note (Signed)
I have reviewed recent myeloma panel with the patient She has achieved maximum response to therapy, currently at [facility] I recommend continue on Faspro only We will continue myeloma panel once a month She agreed with the plan of care She will continue Zometa every 3 months along with calcium and vitamin D supplement 

## 2020-04-22 LAB — MULTIPLE MYELOMA PANEL, SERUM
Albumin SerPl Elph-Mcnc: 4 g/dL (ref 2.9–4.4)
Albumin/Glob SerPl: 1.7 (ref 0.7–1.7)
Alpha 1: 0.3 g/dL (ref 0.0–0.4)
Alpha2 Glob SerPl Elph-Mcnc: 0.8 g/dL (ref 0.4–1.0)
B-Globulin SerPl Elph-Mcnc: 1.1 g/dL (ref 0.7–1.3)
Gamma Glob SerPl Elph-Mcnc: 0.3 g/dL — ABNORMAL LOW (ref 0.4–1.8)
Globulin, Total: 2.4 g/dL (ref 2.2–3.9)
IgA: 5 mg/dL — ABNORMAL LOW (ref 87–352)
IgG (Immunoglobin G), Serum: 318 mg/dL — ABNORMAL LOW (ref 586–1602)
IgM (Immunoglobulin M), Srm: <5 mg/dL — ABNORMAL LOW (ref 26–217)
Total Protein ELP: 6.4 g/dL (ref 6.0–8.5)

## 2020-04-22 LAB — KAPPA/LAMBDA LIGHT CHAINS
Kappa free light chain: 7.9 mg/L (ref 3.3–19.4)
Kappa, lambda light chain ratio: 4.16 — ABNORMAL HIGH (ref 0.26–1.65)
Lambda free light chains: 1.9 mg/L — ABNORMAL LOW (ref 5.7–26.3)

## 2020-05-19 ENCOUNTER — Encounter: Payer: Self-pay | Admitting: Hematology and Oncology

## 2020-05-19 ENCOUNTER — Inpatient Hospital Stay: Payer: Medicare Other

## 2020-05-19 ENCOUNTER — Inpatient Hospital Stay (HOSPITAL_BASED_OUTPATIENT_CLINIC_OR_DEPARTMENT_OTHER): Payer: Medicare Other | Admitting: Hematology and Oncology

## 2020-05-19 ENCOUNTER — Other Ambulatory Visit: Payer: Self-pay

## 2020-05-19 ENCOUNTER — Inpatient Hospital Stay: Payer: Medicare Other | Attending: Hematology and Oncology

## 2020-05-19 DIAGNOSIS — C9001 Multiple myeloma in remission: Secondary | ICD-10-CM

## 2020-05-19 DIAGNOSIS — D84821 Immunodeficiency due to drugs: Secondary | ICD-10-CM | POA: Diagnosis not present

## 2020-05-19 DIAGNOSIS — Z881 Allergy status to other antibiotic agents status: Secondary | ICD-10-CM | POA: Insufficient documentation

## 2020-05-19 DIAGNOSIS — Z79899 Other long term (current) drug therapy: Secondary | ICD-10-CM | POA: Diagnosis not present

## 2020-05-19 DIAGNOSIS — D6481 Anemia due to antineoplastic chemotherapy: Secondary | ICD-10-CM | POA: Insufficient documentation

## 2020-05-19 DIAGNOSIS — D801 Nonfamilial hypogammaglobulinemia: Secondary | ICD-10-CM | POA: Diagnosis not present

## 2020-05-19 DIAGNOSIS — E876 Hypokalemia: Secondary | ICD-10-CM | POA: Insufficient documentation

## 2020-05-19 DIAGNOSIS — N183 Chronic kidney disease, stage 3 unspecified: Secondary | ICD-10-CM

## 2020-05-19 DIAGNOSIS — Z23 Encounter for immunization: Secondary | ICD-10-CM

## 2020-05-19 DIAGNOSIS — Z7189 Other specified counseling: Secondary | ICD-10-CM

## 2020-05-19 DIAGNOSIS — Z880 Allergy status to penicillin: Secondary | ICD-10-CM | POA: Insufficient documentation

## 2020-05-19 DIAGNOSIS — J984 Other disorders of lung: Secondary | ICD-10-CM | POA: Insufficient documentation

## 2020-05-19 DIAGNOSIS — T451X5A Adverse effect of antineoplastic and immunosuppressive drugs, initial encounter: Secondary | ICD-10-CM | POA: Diagnosis not present

## 2020-05-19 DIAGNOSIS — Z5112 Encounter for antineoplastic immunotherapy: Secondary | ICD-10-CM | POA: Insufficient documentation

## 2020-05-19 DIAGNOSIS — Z885 Allergy status to narcotic agent status: Secondary | ICD-10-CM | POA: Diagnosis not present

## 2020-05-19 DIAGNOSIS — Z9484 Stem cells transplant status: Secondary | ICD-10-CM | POA: Insufficient documentation

## 2020-05-19 DIAGNOSIS — D638 Anemia in other chronic diseases classified elsewhere: Secondary | ICD-10-CM

## 2020-05-19 DIAGNOSIS — Z299 Encounter for prophylactic measures, unspecified: Secondary | ICD-10-CM | POA: Diagnosis not present

## 2020-05-19 DIAGNOSIS — Z886 Allergy status to analgesic agent status: Secondary | ICD-10-CM | POA: Diagnosis not present

## 2020-05-19 LAB — CBC WITH DIFFERENTIAL/PLATELET
Abs Immature Granulocytes: 0.02 10*3/uL (ref 0.00–0.07)
Basophils Absolute: 0.1 10*3/uL (ref 0.0–0.1)
Basophils Relative: 1 %
Eosinophils Absolute: 0.3 10*3/uL (ref 0.0–0.5)
Eosinophils Relative: 5 %
HCT: 34.5 % — ABNORMAL LOW (ref 36.0–46.0)
Hemoglobin: 11.3 g/dL — ABNORMAL LOW (ref 12.0–15.0)
Immature Granulocytes: 0 %
Lymphocytes Relative: 36 %
Lymphs Abs: 1.9 10*3/uL (ref 0.7–4.0)
MCH: 27.5 pg (ref 26.0–34.0)
MCHC: 32.8 g/dL (ref 30.0–36.0)
MCV: 83.9 fL (ref 80.0–100.0)
Monocytes Absolute: 0.5 10*3/uL (ref 0.1–1.0)
Monocytes Relative: 10 %
Neutro Abs: 2.5 10*3/uL (ref 1.7–7.7)
Neutrophils Relative %: 48 %
Platelets: 344 10*3/uL (ref 150–400)
RBC: 4.11 MIL/uL (ref 3.87–5.11)
RDW: 13.4 % (ref 11.5–15.5)
WBC: 5.3 10*3/uL (ref 4.0–10.5)
nRBC: 0 % (ref 0.0–0.2)

## 2020-05-19 LAB — COMPREHENSIVE METABOLIC PANEL
ALT: 16 U/L (ref 0–44)
AST: 19 U/L (ref 15–41)
Albumin: 4 g/dL (ref 3.5–5.0)
Alkaline Phosphatase: 64 U/L (ref 38–126)
Anion gap: 7 (ref 5–15)
BUN: 12 mg/dL (ref 8–23)
CO2: 26 mmol/L (ref 22–32)
Calcium: 9.3 mg/dL (ref 8.9–10.3)
Chloride: 108 mmol/L (ref 98–111)
Creatinine, Ser: 1.14 mg/dL — ABNORMAL HIGH (ref 0.44–1.00)
GFR, Estimated: 49 mL/min — ABNORMAL LOW (ref >60)
Glucose, Bld: 78 mg/dL (ref 70–99)
Potassium: 3.4 mmol/L — ABNORMAL LOW (ref 3.5–5.1)
Sodium: 141 mmol/L (ref 135–145)
Total Bilirubin: 0.5 mg/dL (ref 0.3–1.2)
Total Protein: 6.7 g/dL (ref 6.5–8.1)

## 2020-05-19 MED ORDER — ACETAMINOPHEN 325 MG PO TABS
650.0000 mg | ORAL_TABLET | Freq: Once | ORAL | Status: AC
  Administered 2020-05-19: 650 mg via ORAL

## 2020-05-19 MED ORDER — MONTELUKAST SODIUM 10 MG PO TABS
10.0000 mg | ORAL_TABLET | Freq: Once | ORAL | Status: AC
  Administered 2020-05-19: 10 mg via ORAL

## 2020-05-19 MED ORDER — DEXAMETHASONE 4 MG PO TABS
ORAL_TABLET | ORAL | Status: AC
  Filled 2020-05-19: qty 3

## 2020-05-19 MED ORDER — DEXAMETHASONE 4 MG PO TABS
12.0000 mg | ORAL_TABLET | Freq: Once | ORAL | Status: AC
  Administered 2020-05-19: 12 mg via ORAL

## 2020-05-19 MED ORDER — DIPHENHYDRAMINE HCL 25 MG PO CAPS
25.0000 mg | ORAL_CAPSULE | Freq: Once | ORAL | Status: AC
  Administered 2020-05-19: 25 mg via ORAL

## 2020-05-19 MED ORDER — DIPHENHYDRAMINE HCL 25 MG PO CAPS
ORAL_CAPSULE | ORAL | Status: AC
  Filled 2020-05-19: qty 1

## 2020-05-19 MED ORDER — MONTELUKAST SODIUM 10 MG PO TABS
ORAL_TABLET | ORAL | Status: AC
  Filled 2020-05-19: qty 1

## 2020-05-19 MED ORDER — DARATUMUMAB-HYALURONIDASE-FIHJ 1800-30000 MG-UT/15ML ~~LOC~~ SOLN
1800.0000 mg | Freq: Once | SUBCUTANEOUS | Status: AC
  Administered 2020-05-19: 1800 mg via SUBCUTANEOUS
  Filled 2020-05-19: qty 15

## 2020-05-19 MED ORDER — ACETAMINOPHEN 325 MG PO TABS
ORAL_TABLET | ORAL | Status: AC
  Filled 2020-05-19: qty 2

## 2020-05-19 NOTE — Assessment & Plan Note (Signed)
She has chronic mild intermittent hypokalemia likely secondary to her medications She is not symptomatic Observe only

## 2020-05-19 NOTE — Assessment & Plan Note (Signed)
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  Observe  

## 2020-05-19 NOTE — Progress Notes (Signed)
   Covid-19 Vaccination Clinic  Name:  Jessica [name]    MRN: [id number] DOB: Dec 27, 1950  05/19/2020  Jessica [name] was observed post Covid-19 immunization for 15 minutes without incident. She was provided with Vaccine Information Sheet and instruction to access the V-Safe system.   Jessica [name] was instructed to call 911 with any severe reactions post vaccine: Marland Kitchen Difficulty breathing  . Swelling of face and throat  . A fast heartbeat  . A bad rash all over body  . Dizziness and weakness

## 2020-05-19 NOTE — Assessment & Plan Note (Signed)
I have reviewed recent myeloma panel with the patient She has achieved maximum response to therapy, currently at [facility] I recommend continue on Faspro only We will continue myeloma panel once a month She agreed with the plan of care She will continue Zometa every 3 months along with calcium and vitamin D supplement 

## 2020-05-19 NOTE — Assessment & Plan Note (Signed)
We discussed the importance of preventive care and reviewed the vaccination programs. She does not have any prior allergic reactions to Covid-19 vaccination. She agrees to proceed with booster dose of Covid-19 vaccination today and we will administer it today at the clinic.  

## 2020-05-19 NOTE — Patient Instructions (Signed)
[facility] Discharge Instructions for Patients Receiving Chemotherapy  Today you received the following chemotherapy agents: Darzalex Faspro  To help prevent nausea and vomiting after your treatment, we encourage you to take your nausea medication as directed.    If you develop nausea and vomiting that is not controlled by your nausea medication, call the clinic.   BELOW ARE SYMPTOMS THAT SHOULD BE REPORTED IMMEDIATELY:  *FEVER GREATER THAN 100.5 F  *CHILLS WITH OR WITHOUT FEVER  NAUSEA AND VOMITING THAT IS NOT CONTROLLED WITH YOUR NAUSEA MEDICATION  *UNUSUAL SHORTNESS OF BREATH  *UNUSUAL BRUISING OR BLEEDING  TENDERNESS IN MOUTH AND THROAT WITH OR WITHOUT PRESENCE OF ULCERS  *URINARY PROBLEMS  *BOWEL PROBLEMS  UNUSUAL RASH Items with * indicate a potential emergency and should be followed up as soon as possible.  Feel free to call the clinic should you have any questions or concerns. The clinic phone number is [phone].  Please show the [facility] at check-in to the Emergency Department and triage nurse.   Influenza Virus Vaccine injection What is this medicine? INFLUENZA VIRUS VACCINE (in floo EN zuh VAHY ruhs vak SEEN) helps to reduce the risk of getting influenza also known as the flu. The vaccine only helps protect you against some strains of the flu. This medicine may be used for other purposes; ask your health care provider or pharmacist if you have questions. COMMON BRAND NAME(S): Afluria, Afluria Quadrivalent, Agriflu, Alfuria, FLUAD, Fluarix, Fluarix Quadrivalent, Flublok, Flublok Quadrivalent, FLUCELVAX, FLUCELVAX Quadrivalent, Flulaval, Flulaval Quadrivalent, Fluvirin, Fluzone, Fluzone High-Dose, Fluzone Intradermal, Fluzone Quadrivalent What should I tell my health care provider before I take this medicine? They need to know if you have any of these conditions:  bleeding disorder like hemophilia  fever or infection  Guillain-Barre  syndrome or other neurological problems  immune system problems  infection with the human immunodeficiency virus (HIV) or AIDS  low blood platelet counts  multiple sclerosis  an unusual or allergic reaction to influenza virus vaccine, latex, other medicines, foods, dyes, or preservatives. Different brands of vaccines contain different allergens. Some may contain latex or eggs. Talk to your doctor about your allergies to make sure that you get the right vaccine.  pregnant or trying to get pregnant  breast-feeding How should I use this medicine? This vaccine is for injection into a muscle or under the skin. It is given by a health care professional. A copy of Vaccine Information Statements will be given before each vaccination. Read this sheet carefully each time. The sheet may change frequently. Talk to your healthcare provider to see which vaccines are right for you. Some vaccines should not be used in all age groups. Overdosage: If you think you have taken too much of this medicine contact a poison control center or emergency room at once. NOTE: This medicine is only for you. Do not share this medicine with others. What if I miss a dose? This does not apply. What may interact with this medicine?  chemotherapy or radiation therapy  medicines that lower your immune system like etanercept, anakinra, infliximab, and adalimumab  medicines that treat or prevent blood clots like warfarin  phenytoin  steroid medicines like prednisone or cortisone  theophylline  vaccines This list may not describe all possible interactions. Give your health care provider a list of all the medicines, herbs, non-prescription drugs, or dietary supplements you use. Also tell them if you smoke, drink alcohol, or use illegal drugs. Some items may interact with your  medicine. What should I watch for while using this medicine? Report any side effects that do not go away within 3 days to your doctor or health  care professional. Call your health care provider if any unusual symptoms occur within 6 weeks of receiving this vaccine. You may still catch the flu, but the illness is not usually as bad. You cannot get the flu from the vaccine. The vaccine will not protect against colds or other illnesses that may cause fever. The vaccine is needed every year. What side effects may I notice from receiving this medicine? Side effects that you should report to your doctor or health care professional as soon as possible:  allergic reactions like skin rash, itching or hives, swelling of the face, lips, or tongue Side effects that usually do not require medical attention (report to your doctor or health care professional if they continue or are bothersome):  fever  headache  muscle aches and pains  pain, tenderness, redness, or swelling at the injection site  tiredness This list may not describe all possible side effects. Call your doctor for medical advice about side effects. You may report side effects to FDA at 1-800-FDA-1088. Where should I keep my medicine? The vaccine will be given by a health care professional in a clinic, pharmacy, doctor's office, or other health care setting. You will not be given vaccine doses to store at home. NOTE: This sheet is a summary. It may not cover all possible information. If you have questions about this medicine, talk to your doctor, pharmacist, or health care provider.  2020 Elsevier/Gold Standard (2018-06-20 08:45:43)

## 2020-05-19 NOTE — Assessment & Plan Note (Signed)
She has mild elevated serum creatinine recently I suspect a component of dehydration I recommend the patient to drink more fluids

## 2020-05-19 NOTE — Progress Notes (Signed)
[place] OFFICE PROGRESS NOTE  Patient Care Team: Josetta Huddle, MD as PCP - General (Internal Medicine) Ginette Pitman, MD as Consulting Physician (Hematology and Oncology) Hessie Dibble, MD as Consulting Physician (Hematology and Oncology)  ASSESSMENT & PLAN:  Multiple myeloma in remission [facility]) I have reviewed recent myeloma panel with the patient She has achieved maximum response to therapy, currently at [facility] I recommend continue on Faspro only We will continue myeloma panel once a month She agreed with the plan of care She will continue Zometa every 3 months along with calcium and vitamin D supplement  Immunocompromised state due to drug therapy [facility]) 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 She does not have any prior allergic reactions to Covid-19 vaccination. She agrees to proceed with booster dose of Covid-19 vaccination today and we will administer it today at the clinic.   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  Observe    No orders of the defined types were placed in this encounter.   All questions were answered. The patient knows to call the clinic with any problems, questions or concerns. The total time spent in the appointment was 30 minutes encounter with patients including  review of chart and various tests results, discussions about plan of care and coordination of care plan   [name], MD 05/19/2020 9:40 AM  INTERVAL HISTORY: Please see below for problem oriented charting. She returns for treatment and follow-up She tolerated recent treatment well No infusion reactions No recent infection, fever or chills Denies any new bone pain No recent dental issues She is concerned about her jury duty letter She does not think she can perform her civil duty due to her immunocompromise state, which I agree She asked for a letter to support her to be excused from jury duty today  SUMMARY OF ONCOLOGIC HISTORY: Oncology History Overview Note   M-protein 0.69 gm/dl IFIX - IgG, Kappa IgG - 868 IgA - 19 IgM - < 20 Kappa - 21 Lambda - 5.7  09/06/2014 - Bone marrow aspirate and biopsy:   Normocellular marrow for age (40%) with a small monoclonal plasma cell population (1% on aspirate). Karyotype 73, XX  FISH Negative for myeloma associated changes  09/12/2014 - PET/CT  Two regions that are concerning for disease, one adjacent/involving the left ninth rib and one in the marrow of the right femur, in this patient with history of plasmacytoma.    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   04/01/2015 Bone Marrow Transplant   She received melphalan chemotherapy on 03/31/2015 followed by autologous stem cell transplant the day after   04/03/2015 - 04/18/2015 Hospital Admission   The patient was admitted to the hospital at [place] for management related to complication from stem cell transplant. She had significant nausea requiring intravenous anti-emetics.   07/17/2015 -  Chemotherapy   She started maintenance Revlimid and monthly zometa, then every 3 months   06/01/2018 Imaging   DEXA scan showed bone density T score in femur -2.3   04/20/2019 PET scan   1. No FDG avid osseous lesions or mass identified to suggest metabolically active lesion of myeloma or plasmacytoma. 2. Small nodular density within the paravertebral right lower lobe exhibits mild to moderate increased uptake within SUV max of 3.38. This is indeterminate. Review of CT chest from 10/05/2018 shows a corresponding Lung nodule in this area measuring the same. Small pulmonary neoplasm cannot be excluded. 3. Indeterminate, focal area of increased uptake is identified within the thoracic canal. Indeterminate favored to represent benign physiologic CNS activity.     04/30/2019 -  Chemotherapy   The patient had dexamethasone (DECADRON) tablet 20 mg, 20 mg (100 % of original dose 20 mg), Oral,  Once, 14 of 16 cycles Dose modification: 20 mg (original dose 20 mg, Cycle 1), 12 mg (60 % of original dose 20 mg, Cycle 2, Reason: Dose Not Tolerated), 10 mg (50 % of original dose 20 mg, Cycle 4, Reason: Provider Judgment), 20 mg (original dose 20 mg, Cycle 1) Administration: 20 mg (05/07/2019), 20 mg (05/14/2019), 20 mg (05/21/2019), 20 mg (05/28/2019), 20 mg  (06/04/2019), 12 mg (06/11/2019), 12 mg (06/18/2019), 12 mg (06/25/2019), 12 mg (07/09/2019), 10 mg (07/23/2019), 10 mg (08/06/2019), 10 mg (08/20/2019), 10 mg (09/03/2019), 10 mg (09/17/2019), 10 mg (10/01/2019), 12 mg (10/15/2019), 12 mg (11/12/2019), 12 mg (12/10/2019), 20 mg (04/30/2019), 12 mg (01/14/2020), 12 mg (02/18/2020), 12 mg (03/17/2020), 12 mg (04/21/2020) daratumumab-hyaluronidase-fihj (DARZALEX FASPRO) 1800-30000 MG-UT/15ML chemo SQ injection 1,800 mg, 1,800 mg, Subcutaneous,  Once, 14 of 16 cycles Administration: 1,800 mg (04/30/2019), 1,800 mg (05/07/2019), 1,800 mg (05/14/2019), 1,800 mg (05/21/2019), 1,800 mg (05/28/2019), 1,800 mg (06/04/2019), 1,800 mg (06/11/2019), 1,800 mg (06/18/2019), 1,800 mg (06/25/2019), 1,800 mg (07/09/2019), 1,800 mg (10/15/2019), 1,800 mg (07/23/2019), 1,800 mg (08/06/2019), 1,800 mg (08/20/2019), 1,800 mg (09/03/2019), 1,800 mg (09/17/2019), 1,800 mg (10/01/2019), 1,800 mg (11/12/2019), 1,800 mg (12/10/2019), 1,800 mg (01/14/2020), 1,800 mg (02/18/2020), 1,800 mg (03/17/2020), 1,800 mg (04/21/2020)  for chemotherapy treatment.    04/30/2019 - 07/09/2019 Chemotherapy   The patient had bortezomib SQ (VELCADE) chemo injection 1.75 mg, 1.3 mg/m2 = 1.75 mg, Subcutaneous,  Once, 10 of 11 cycles Administration: 1.75 mg (04/30/2019), 1.75 mg (05/07/2019), 1.75 mg (05/14/2019), 1.75 mg (05/21/2019), 1.75 mg (05/28/2019), 1.75 mg (06/04/2019), 1.75 mg (06/11/2019), 1.75 mg (06/18/2019), 1.75 mg (06/25/2019),  1.75 mg (07/09/2019)  for chemotherapy treatment.      REVIEW OF SYSTEMS:   Constitutional: Denies fevers, chills or abnormal weight loss Eyes: Denies blurriness of vision Ears, nose, mouth, throat, and face: Denies mucositis or sore throat Respiratory: Denies cough, dyspnea or wheezes Cardiovascular: Denies palpitation, chest discomfort or lower extremity swelling Gastrointestinal:  Denies nausea, heartburn or change in bowel habits Skin: Denies abnormal skin rashes Lymphatics: Denies new  lymphadenopathy or easy bruising Neurological:Denies numbness, tingling or new weaknesses Behavioral/Psych: Mood is stable, no new changes  All other systems were reviewed with the patient and are negative.  I have reviewed the past medical history, past surgical history, social history and family history with the patient and they are unchanged from previous note.  ALLERGIES:  is allergic to augmentin [amoxicillin-pot clavulanate], albuterol, codeine, ibuprofen, nsaids, and tolmetin.  MEDICATIONS:  Current Outpatient Medications  Medication Sig Dispense Refill  . acetaminophen (TYLENOL) 500 MG tablet Take 500-1,000 mg by mouth every 6 (six) hours as needed for mild pain, moderate pain, fever or headache.    Marland Kitchen acyclovir (ZOVIRAX) 400 MG tablet Take 1 tablet (400 mg total) by mouth 2 (two) times daily. 180 tablet 3  . aspirin EC 81 MG tablet Take 81 mg by mouth daily with breakfast.    . calcium carbonate (OSCAL) 1500 (600 Ca) MG TABS tablet Take 1,500 mg by mouth daily with breakfast.    . Calcium Carbonate 500 MG CHEW Chew 500 mg by mouth as needed for indigestion.    . Cholecalciferol (VITAMIN D3) 2000 units capsule Take 2,000 Units by mouth daily.     . famotidine (PEPCID) 20 MG tablet Take 20 mg by mouth daily.    . Multiple Vitamins-Minerals (CENTRUM SILVER PO) Take 1 tablet by mouth daily.     Marland Kitchen omeprazole (PRILOSEC) 20 MG capsule Take 1 capsule (20 mg total) by mouth daily. 90 capsule 11  . ondansetron (ZOFRAN) 8 MG tablet TAKE 1 TABLET(8 MG) BY MOUTH EVERY 8 HOURS AS NEEDED FOR NAUSEA OR VOMITING 60 tablet 11  . senna-docusate (SENOKOT-S) 8.6-50 MG tablet Take 1-2 tablets by mouth daily as needed for mild constipation or moderate constipation.     . sodium chloride (OCEAN) 0.65 % SOLN nasal spray Place 1 spray into both nostrils as needed for congestion.    . triamterene-hydrochlorothiazide (MAXZIDE-25) 37.5-25 MG tablet Take 0.5 tablets by mouth daily.    . vitamin B-12  (CYANOCOBALAMIN) 500 MCG tablet Take 500 mcg by mouth daily.     No current facility-administered medications for this visit.   Facility-Administered Medications Ordered in Other Visits  Medication Dose Route Frequency Provider Last Rate Last Admin  . 0.9 %  sodium chloride infusion   Intravenous Once Alvy Bimler, Rion Schnitzer, MD      . daratumumab-hyaluronidase-fihj (DARZALEX FASPRO) 1800-30000 MG-UT/15ML chemo SQ injection 1,800 mg  1,800 mg Subcutaneous Once Alvy Bimler, Modene Andy, MD        PHYSICAL EXAMINATION: ECOG PERFORMANCE STATUS: 1 - Symptomatic but completely ambulatory  Vitals:   05/19/20 0903  BP: (!) 148/92  Pulse: 89  Resp: 18  Temp: (!) 97.4 F (36.3 C)  SpO2: 100%   Filed Weights   05/19/20 0903  Weight: 114 lb 3.2 oz (51.8 kg)    GENERAL:alert, no distress and comfortable SKIN: skin color, texture, turgor are normal, no rashes or significant lesions EYES: normal, Conjunctiva are pink and non-injected, sclera clear OROPHARYNX:no exudate, no erythema and lips, buccal mucosa, and tongue normal  NECK:  supple, thyroid normal size, non-tender, without nodularity LYMPH:  no palpable lymphadenopathy in the cervical, axillary or inguinal LUNGS: clear to auscultation and percussion with normal breathing effort HEART: regular rate & rhythm and no murmurs and no lower extremity edema ABDOMEN:abdomen soft, non-tender and normal bowel sounds Musculoskeletal:no cyanosis of digits and no clubbing  NEURO: alert & oriented x 3 with fluent speech, no focal motor/sensory deficits  LABORATORY DATA:  I have reviewed the data as listed    Component Value Date/Time   NA 141 05/19/2020 0844   NA 143 07/14/2017 1245   K 3.4 (L) 05/19/2020 0844   K 3.3 (L) 07/14/2017 1245   CL 108 05/19/2020 0844   CO2 26 05/19/2020 0844   CO2 20 (L) 07/14/2017 1245   GLUCOSE 78 05/19/2020 0844   GLUCOSE 106 07/14/2017 1245   BUN 12 05/19/2020 0844   BUN 9.3 07/14/2017 1245   CREATININE 1.14 (H) 05/19/2020 0844    CREATININE 1.07 (H) 01/14/2020 0828   CREATININE 0.8 07/14/2017 1245   CALCIUM 9.3 05/19/2020 0844   CALCIUM 8.4 07/14/2017 1245   PROT 6.7 05/19/2020 0844   PROT 6.0 07/14/2017 1245   PROT 6.2 (L) 07/14/2017 1245   ALBUMIN 4.0 05/19/2020 0844   ALBUMIN 3.6 07/14/2017 1245   AST 19 05/19/2020 0844   AST 21 01/14/2020 0828   AST 21 07/14/2017 1245   ALT 16 05/19/2020 0844   ALT 15 01/14/2020 0828   ALT 21 07/14/2017 1245   ALKPHOS 64 05/19/2020 0844   ALKPHOS 78 07/14/2017 1245   BILITOT 0.5 05/19/2020 0844   BILITOT 0.8 01/14/2020 0828   BILITOT 0.43 07/14/2017 1245   GFRNONAA 49 (L) 05/19/2020 0844   GFRNONAA 53 (L) 01/14/2020 0828   GFRAA 57 (L) 04/21/2020 0817   GFRAA >60 01/14/2020 0828    No results found for: SPEP, UPEP  Lab Results  Component Value Date   WBC 5.3 05/19/2020   NEUTROABS 2.5 05/19/2020   HGB 11.3 (L) 05/19/2020   HCT 34.5 (L) 05/19/2020   MCV 83.9 05/19/2020   PLT 344 05/19/2020      Chemistry      Component Value Date/Time   NA 141 05/19/2020 0844   NA 143 07/14/2017 1245   K 3.4 (L) 05/19/2020 0844   K 3.3 (L) 07/14/2017 1245   CL 108 05/19/2020 0844   CO2 26 05/19/2020 0844   CO2 20 (L) 07/14/2017 1245   BUN 12 05/19/2020 0844   BUN 9.3 07/14/2017 1245   CREATININE 1.14 (H) 05/19/2020 0844   CREATININE 1.07 (H) 01/14/2020 0828   CREATININE 0.8 07/14/2017 1245      Component Value Date/Time   CALCIUM 9.3 05/19/2020 0844   CALCIUM 8.4 07/14/2017 1245   ALKPHOS 64 05/19/2020 0844   ALKPHOS 78 07/14/2017 1245   AST 19 05/19/2020 0844   AST 21 01/14/2020 0828   AST 21 07/14/2017 1245   ALT 16 05/19/2020 0844   ALT 15 01/14/2020 0828   ALT 21 07/14/2017 1245   BILITOT 0.5 05/19/2020 0844   BILITOT 0.8 01/14/2020 0828   BILITOT 0.43 07/14/2017 1245

## 2020-05-19 NOTE — Assessment & Plan Note (Signed)
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

## 2020-05-20 LAB — KAPPA/LAMBDA LIGHT CHAINS
Kappa free light chain: 8.6 mg/L (ref 3.3–19.4)
Kappa, lambda light chain ratio: 3.91 — ABNORMAL HIGH (ref 0.26–1.65)
Lambda free light chains: 2.2 mg/L — ABNORMAL LOW (ref 5.7–26.3)

## 2020-05-21 LAB — MULTIPLE MYELOMA PANEL, SERUM
Albumin SerPl Elph-Mcnc: 3.7 g/dL (ref 2.9–4.4)
Albumin/Glob SerPl: 1.5 (ref 0.7–1.7)
Alpha 1: 0.2 g/dL (ref 0.0–0.4)
Alpha2 Glob SerPl Elph-Mcnc: 0.9 g/dL (ref 0.4–1.0)
B-Globulin SerPl Elph-Mcnc: 1.1 g/dL (ref 0.7–1.3)
Gamma Glob SerPl Elph-Mcnc: 0.3 g/dL — ABNORMAL LOW (ref 0.4–1.8)
Globulin, Total: 2.5 g/dL (ref 2.2–3.9)
IgA: 6 mg/dL — ABNORMAL LOW (ref 87–352)
IgG (Immunoglobin G), Serum: 360 mg/dL — ABNORMAL LOW (ref 586–1602)
IgM (Immunoglobulin M), Srm: <5 mg/dL — ABNORMAL LOW (ref 26–217)
M Protein SerPl Elph-Mcnc: 0.1 g/dL — ABNORMAL HIGH
Total Protein ELP: 6.2 g/dL (ref 6.0–8.5)

## 2020-06-16 ENCOUNTER — Encounter: Payer: Self-pay | Admitting: Hematology and Oncology

## 2020-06-16 ENCOUNTER — Inpatient Hospital Stay (HOSPITAL_BASED_OUTPATIENT_CLINIC_OR_DEPARTMENT_OTHER): Payer: Medicare Other | Admitting: Hematology and Oncology

## 2020-06-16 ENCOUNTER — Other Ambulatory Visit: Payer: Self-pay | Admitting: Hematology and Oncology

## 2020-06-16 ENCOUNTER — Inpatient Hospital Stay: Payer: Medicare Other

## 2020-06-16 ENCOUNTER — Inpatient Hospital Stay: Payer: Medicare Other | Attending: Hematology and Oncology

## 2020-06-16 ENCOUNTER — Other Ambulatory Visit: Payer: Self-pay

## 2020-06-16 DIAGNOSIS — C9001 Multiple myeloma in remission: Secondary | ICD-10-CM | POA: Insufficient documentation

## 2020-06-16 DIAGNOSIS — Z881 Allergy status to other antibiotic agents status: Secondary | ICD-10-CM | POA: Insufficient documentation

## 2020-06-16 DIAGNOSIS — Z79899 Other long term (current) drug therapy: Secondary | ICD-10-CM | POA: Diagnosis not present

## 2020-06-16 DIAGNOSIS — Z5112 Encounter for antineoplastic immunotherapy: Secondary | ICD-10-CM | POA: Diagnosis not present

## 2020-06-16 DIAGNOSIS — Z7189 Other specified counseling: Secondary | ICD-10-CM

## 2020-06-16 DIAGNOSIS — Z880 Allergy status to penicillin: Secondary | ICD-10-CM | POA: Diagnosis not present

## 2020-06-16 DIAGNOSIS — N183 Chronic kidney disease, stage 3 unspecified: Secondary | ICD-10-CM | POA: Diagnosis not present

## 2020-06-16 DIAGNOSIS — Z9484 Stem cells transplant status: Secondary | ICD-10-CM | POA: Insufficient documentation

## 2020-06-16 DIAGNOSIS — Z886 Allergy status to analgesic agent status: Secondary | ICD-10-CM | POA: Diagnosis not present

## 2020-06-16 DIAGNOSIS — D6481 Anemia due to antineoplastic chemotherapy: Secondary | ICD-10-CM | POA: Insufficient documentation

## 2020-06-16 DIAGNOSIS — Z885 Allergy status to narcotic agent status: Secondary | ICD-10-CM | POA: Insufficient documentation

## 2020-06-16 DIAGNOSIS — T451X5A Adverse effect of antineoplastic and immunosuppressive drugs, initial encounter: Secondary | ICD-10-CM | POA: Diagnosis not present

## 2020-06-16 DIAGNOSIS — D638 Anemia in other chronic diseases classified elsewhere: Secondary | ICD-10-CM

## 2020-06-16 DIAGNOSIS — J984 Other disorders of lung: Secondary | ICD-10-CM | POA: Insufficient documentation

## 2020-06-16 DIAGNOSIS — Z9481 Bone marrow transplant status: Secondary | ICD-10-CM

## 2020-06-16 LAB — COMPREHENSIVE METABOLIC PANEL
ALT: 10 U/L (ref 0–44)
AST: 17 U/L (ref 15–41)
Albumin: 3.8 g/dL (ref 3.5–5.0)
Alkaline Phosphatase: 59 U/L (ref 38–126)
Anion gap: 9 (ref 5–15)
BUN: 13 mg/dL (ref 8–23)
CO2: 21 mmol/L — ABNORMAL LOW (ref 22–32)
Calcium: 9 mg/dL (ref 8.9–10.3)
Chloride: 112 mmol/L — ABNORMAL HIGH (ref 98–111)
Creatinine, Ser: 0.98 mg/dL (ref 0.44–1.00)
GFR, Estimated: >60 mL/min (ref >60)
Glucose, Bld: 79 mg/dL (ref 70–99)
Potassium: 3.8 mmol/L (ref 3.5–5.1)
Sodium: 142 mmol/L (ref 135–145)
Total Bilirubin: 0.5 mg/dL (ref 0.3–1.2)
Total Protein: 6.6 g/dL (ref 6.5–8.1)

## 2020-06-16 LAB — CBC WITH DIFFERENTIAL/PLATELET
Abs Immature Granulocytes: 0.02 10*3/uL (ref 0.00–0.07)
Basophils Absolute: 0.1 10*3/uL (ref 0.0–0.1)
Basophils Relative: 2 %
Eosinophils Absolute: 0.4 10*3/uL (ref 0.0–0.5)
Eosinophils Relative: 9 %
HCT: 34.9 % — ABNORMAL LOW (ref 36.0–46.0)
Hemoglobin: 11.5 g/dL — ABNORMAL LOW (ref 12.0–15.0)
Immature Granulocytes: 0 %
Lymphocytes Relative: 34 %
Lymphs Abs: 1.7 10*3/uL (ref 0.7–4.0)
MCH: 27.4 pg (ref 26.0–34.0)
MCHC: 33 g/dL (ref 30.0–36.0)
MCV: 83.3 fL (ref 80.0–100.0)
Monocytes Absolute: 0.5 10*3/uL (ref 0.1–1.0)
Monocytes Relative: 11 %
Neutro Abs: 2.2 10*3/uL (ref 1.7–7.7)
Neutrophils Relative %: 44 %
Platelets: 309 10*3/uL (ref 150–400)
RBC: 4.19 MIL/uL (ref 3.87–5.11)
RDW: 13.5 % (ref 11.5–15.5)
WBC: 4.9 10*3/uL (ref 4.0–10.5)
nRBC: 0 % (ref 0.0–0.2)

## 2020-06-16 MED ORDER — MONTELUKAST SODIUM 10 MG PO TABS
10.0000 mg | ORAL_TABLET | Freq: Once | ORAL | Status: AC
  Administered 2020-06-16: 10 mg via ORAL

## 2020-06-16 MED ORDER — SODIUM CHLORIDE 0.9 % IV SOLN
Freq: Once | INTRAVENOUS | Status: AC
  Filled 2020-06-16: qty 250

## 2020-06-16 MED ORDER — DEXAMETHASONE 4 MG PO TABS
ORAL_TABLET | ORAL | Status: AC
  Filled 2020-06-16: qty 3

## 2020-06-16 MED ORDER — ACETAMINOPHEN 325 MG PO TABS
ORAL_TABLET | ORAL | Status: AC
  Filled 2020-06-16: qty 2

## 2020-06-16 MED ORDER — ZOLEDRONIC ACID 4 MG/100ML IV SOLN
4.0000 mg | Freq: Once | INTRAVENOUS | Status: AC
  Administered 2020-06-16: 4 mg via INTRAVENOUS

## 2020-06-16 MED ORDER — ZOLEDRONIC ACID 4 MG/100ML IV SOLN
INTRAVENOUS | Status: AC
  Filled 2020-06-16: qty 100

## 2020-06-16 MED ORDER — DEXAMETHASONE 4 MG PO TABS
8.0000 mg | ORAL_TABLET | Freq: Once | ORAL | Status: AC
  Administered 2020-06-16: 8 mg via ORAL

## 2020-06-16 MED ORDER — DARATUMUMAB-HYALURONIDASE-FIHJ 1800-30000 MG-UT/15ML ~~LOC~~ SOLN
1800.0000 mg | Freq: Once | SUBCUTANEOUS | Status: AC
  Administered 2020-06-16: 1800 mg via SUBCUTANEOUS
  Filled 2020-06-16: qty 15

## 2020-06-16 MED ORDER — ACETAMINOPHEN 325 MG PO TABS
650.0000 mg | ORAL_TABLET | Freq: Once | ORAL | Status: AC
  Administered 2020-06-16: 650 mg via ORAL

## 2020-06-16 MED ORDER — MONTELUKAST SODIUM 10 MG PO TABS
ORAL_TABLET | ORAL | Status: AC
  Filled 2020-06-16: qty 1

## 2020-06-16 MED ORDER — DIPHENHYDRAMINE HCL 25 MG PO CAPS
ORAL_CAPSULE | ORAL | Status: AC
  Filled 2020-06-16: qty 1

## 2020-06-16 NOTE — Assessment & Plan Note (Signed)
She does not need dose adjustment for Faspro °I will prescribe slight dose adjustment for Zometa °

## 2020-06-16 NOTE — Assessment & Plan Note (Signed)
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  Observe  

## 2020-06-16 NOTE — Progress Notes (Signed)
[name]  Patient Care Team: Jessica Huddle, MD as PCP - General (Internal Medicine) Jessica Pitman, MD as Consulting Physician (Hematology and Oncology) Jessica Dibble, MD as Consulting Physician (Hematology and Oncology)  ASSESSMENT & PLAN:  Multiple myeloma in remission [facility]) I have reviewed recent myeloma panel with the patient She has achieved maximum response to therapy, currently at [facility] I recommend continue on Faspro only We will continue myeloma panel once a month She agreed with the plan of care She will continue Zometa every 3 months along with calcium and vitamin D supplement  Chronic kidney disease, stage III (moderate) She does not need dose adjustment for Faspro I will prescribe slight dose adjustment for Zometa  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  Observe    No orders of the defined types were placed in this encounter.   All questions were answered. The patient knows to call the clinic with any problems, questions or concerns. The total time spent in the appointment was 20 minutes encounter with patients including review of chart and various tests results, discussions about plan of care and coordination of care plan   Jessica Lark, MD 06/16/2020 10:09 AM  INTERVAL HISTORY: Please see below for problem oriented charting. She returns for treatment and follow-up Denies recent infection, fever or chills No infusion reactions She is up-to-date with her vaccination programs  SUMMARY OF ONCOLOGIC HISTORY: Oncology History Overview Note   M-protein 0.69 gm/dl IFIX - IgG, Kappa IgG - 868 IgA - 19 IgM - < 20 Kappa - 21 Lambda - 5.7  09/06/2014 - Bone marrow aspirate and biopsy:   Normocellular marrow for age (40%) with a small monoclonal plasma cell population (1% on aspirate). Karyotype 22, XX  FISH Negative  for myeloma associated changes  09/12/2014 - PET/CT  Two regions that are concerning for disease, one adjacent/involving the left ninth rib and one in the marrow of the right femur, in this patient with history of plasmacytoma.    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   04/01/2015 Bone Marrow Transplant   She received melphalan chemotherapy on 03/31/2015 followed by autologous stem cell transplant the day after   04/03/2015 - 04/18/2015 Hospital Admission   The patient was admitted to the hospital at [facility] for management related to complication from stem cell transplant. She  had significant nausea requiring intravenous anti-emetics.   07/17/2015 -  Chemotherapy   She started maintenance Revlimid and monthly zometa, then every 3 months   06/01/2018 Imaging   DEXA scan showed bone density T score in femur -2.3   04/20/2019 PET scan   1. No FDG avid osseous lesions or mass identified to suggest metabolically active lesion of myeloma or  plasmacytoma. 2. Small nodular density within the paravertebral right lower lobe exhibits mild to moderate increased uptake within SUV max of 3.38. This is indeterminate. Review of CT chest from 10/05/2018 shows a corresponding Lung nodule in this area measuring the same. Small pulmonary neoplasm cannot be excluded. 3. Indeterminate, focal area of increased uptake is identified within the thoracic canal. Indeterminate favored to represent benign physiologic CNS activity.     04/30/2019 -  Chemotherapy   The patient had dexamethasone (DECADRON) tablet 20 mg, 20 mg (100 % of original dose 20 mg), Oral,  Once, 15 of 16 cycles Dose modification: 20 mg (original dose 20 mg, Cycle 1), 12 mg (60 % of original dose 20 mg, Cycle 2, Reason: Dose Not Tolerated), 10 mg (50 % of original dose 20 mg, Cycle 4, Reason: Provider Judgment), 20 mg (original dose 20 mg, Cycle 1), 8 mg (40 % of original dose 20 mg, Cycle 15, Reason: Dose Not Tolerated) Administration: 20 mg (05/07/2019), 20 mg (05/14/2019), 20 mg (05/21/2019), 20 mg (05/28/2019), 20 mg (06/04/2019), 12 mg (06/11/2019), 12 mg (06/18/2019), 12 mg (06/25/2019), 12 mg (07/09/2019), 10 mg (07/23/2019), 10 mg (08/06/2019), 10 mg (08/20/2019), 10 mg (09/03/2019), 10 mg (09/17/2019), 10 mg (10/01/2019), 12 mg (10/15/2019), 12 mg (11/12/2019), 12 mg (12/10/2019), 20 mg (04/30/2019), 12 mg (01/14/2020), 12 mg (02/18/2020), 12 mg (03/17/2020), 12 mg (04/21/2020), 12 mg (05/19/2020) daratumumab-hyaluronidase-fihj (DARZALEX FASPRO) 1800-30000 MG-UT/15ML chemo SQ injection 1,800 mg, 1,800 mg, Subcutaneous,  Once, 15 of 16 cycles Administration: 1,800 mg (04/30/2019), 1,800 mg (05/07/2019), 1,800 mg (05/14/2019), 1,800 mg (05/21/2019), 1,800 mg (05/28/2019), 1,800 mg (06/04/2019), 1,800 mg (06/11/2019), 1,800 mg (06/18/2019), 1,800 mg (06/25/2019), 1,800 mg (07/09/2019), 1,800 mg (10/15/2019), 1,800 mg (07/23/2019), 1,800 mg (08/06/2019), 1,800 mg (08/20/2019), 1,800 mg (09/03/2019), 1,800 mg (09/17/2019),  1,800 mg (10/01/2019), 1,800 mg (11/12/2019), 1,800 mg (12/10/2019), 1,800 mg (01/14/2020), 1,800 mg (02/18/2020), 1,800 mg (03/17/2020), 1,800 mg (04/21/2020), 1,800 mg (05/19/2020)  for chemotherapy treatment.    04/30/2019 - 07/09/2019 Chemotherapy   The patient had bortezomib SQ (VELCADE) chemo injection 1.75 mg, 1.3 mg/m2 = 1.75 mg, Subcutaneous,  Once, 10 of 11 cycles Administration: 1.75 mg (04/30/2019), 1.75 mg (05/07/2019), 1.75 mg (05/14/2019), 1.75 mg (05/21/2019), 1.75 mg (05/28/2019), 1.75 mg (06/04/2019), 1.75 mg (06/11/2019), 1.75 mg (06/18/2019), 1.75 mg (06/25/2019), 1.75 mg (07/09/2019)  for chemotherapy treatment.      REVIEW OF SYSTEMS:   Constitutional: Denies fevers, chills or abnormal weight loss Eyes: Denies blurriness of vision Ears, nose, mouth, throat, and face: Denies mucositis or sore throat Respiratory: Denies cough, dyspnea or wheezes Cardiovascular: Denies palpitation, chest discomfort or lower extremity swelling Gastrointestinal:  Denies nausea, heartburn or change in bowel habits Skin: Denies abnormal skin rashes Lymphatics: Denies new lymphadenopathy or easy bruising Neurological:Denies numbness, tingling or new weaknesses Behavioral/Psych: Mood is stable, no new changes  All other systems were reviewed with the patient and are negative.  I have reviewed the past medical history, past surgical history, social history and family history with the patient and they are unchanged from previous note.  ALLERGIES:  is allergic to augmentin [amoxicillin-pot clavulanate], albuterol, codeine,  ibuprofen, nsaids, and tolmetin.  MEDICATIONS:  Current Outpatient Medications  Medication Sig Dispense Refill  . acetaminophen (TYLENOL) 500 MG tablet Take 500-1,000 mg by mouth every 6 (six) hours as needed for mild pain, moderate pain, fever or headache.    Marland Kitchen acyclovir (ZOVIRAX) 400 MG tablet Take 1 tablet (400 mg total) by mouth 2 (two) times daily. 180 tablet 3  . aspirin EC 81 MG  tablet Take 81 mg by mouth daily with breakfast.    . calcium carbonate (OSCAL) 1500 (600 Ca) MG TABS tablet Take 1,500 mg by mouth daily with breakfast.    . Calcium Carbonate 500 MG CHEW Chew 500 mg by mouth as needed for indigestion.    . Cholecalciferol (VITAMIN D3) 2000 units capsule Take 2,000 Units by mouth daily.     . famotidine (PEPCID) 20 MG tablet Take 20 mg by mouth daily.    . Multiple Vitamins-Minerals (CENTRUM SILVER PO) Take 1 tablet by mouth daily.     Marland Kitchen omeprazole (PRILOSEC) 20 MG capsule Take 1 capsule (20 mg total) by mouth daily. 90 capsule 11  . ondansetron (ZOFRAN) 8 MG tablet TAKE 1 TABLET(8 MG) BY MOUTH EVERY 8 HOURS AS NEEDED FOR NAUSEA OR VOMITING 60 tablet 11  . senna-docusate (SENOKOT-S) 8.6-50 MG tablet Take 1-2 tablets by mouth daily as needed for mild constipation or moderate constipation.     . sodium chloride (OCEAN) 0.65 % SOLN nasal spray Place 1 spray into both nostrils as needed for congestion.    . triamterene-hydrochlorothiazide (MAXZIDE-25) 37.5-25 MG tablet Take 0.5 tablets by mouth daily.    . vitamin B-12 (CYANOCOBALAMIN) 500 MCG tablet Take 500 mcg by mouth daily.     No current facility-administered medications for this visit.   Facility-Administered Medications Ordered in Other Visits  Medication Dose Route Frequency Provider Last Rate Last Admin  . 0.9 %  sodium chloride infusion   Intravenous Once Kaydee Magel, MD      . daratumumab-hyaluronidase-fihj (DARZALEX FASPRO) 1800-30000 MG-UT/15ML chemo SQ injection 1,800 mg  1,800 mg Subcutaneous Once Alvy Bimler, Samariya Rockhold, MD        PHYSICAL EXAMINATION: ECOG PERFORMANCE STATUS: 0 - Asymptomatic  Vitals:   06/16/20 0808  BP: (!) 141/83  Pulse: 76  Resp: 17  Temp: 98.3 F (36.8 C)  SpO2: 96%   Filed Weights   06/16/20 0808  Weight: 113 lb 6.4 oz (51.4 kg)    GENERAL:alert, no distress and comfortable Musculoskeletal:no cyanosis of digits and no clubbing  NEURO: alert & oriented x 3 with fluent  speech, no focal motor/sensory deficits  LABORATORY DATA:  I have reviewed the data as listed    Component Value Date/Time   NA 142 06/16/2020 0743   NA 143 07/14/2017 1245   K 3.8 06/16/2020 0743   K 3.3 (L) 07/14/2017 1245   CL 112 (H) 06/16/2020 0743   CO2 21 (L) 06/16/2020 0743   CO2 20 (L) 07/14/2017 1245   GLUCOSE 79 06/16/2020 0743   GLUCOSE 106 07/14/2017 1245   BUN 13 06/16/2020 0743   BUN 9.3 07/14/2017 1245   CREATININE 0.98 06/16/2020 0743   CREATININE 1.07 (H) 01/14/2020 0828   CREATININE 0.8 07/14/2017 1245   CALCIUM 9.0 06/16/2020 0743   CALCIUM 8.4 07/14/2017 1245   PROT 6.6 06/16/2020 0743   PROT 6.0 07/14/2017 1245   PROT 6.2 (L) 07/14/2017 1245   ALBUMIN 3.8 06/16/2020 0743   ALBUMIN 3.6 07/14/2017 1245   AST 17 06/16/2020 0743  AST 21 01/14/2020 0828   AST 21 07/14/2017 1245   ALT 10 06/16/2020 0743   ALT 15 01/14/2020 0828   ALT 21 07/14/2017 1245   ALKPHOS 59 06/16/2020 0743   ALKPHOS 78 07/14/2017 1245   BILITOT 0.5 06/16/2020 0743   BILITOT 0.8 01/14/2020 0828   BILITOT 0.43 07/14/2017 1245   GFRNONAA >60 06/16/2020 0743   GFRNONAA 53 (L) 01/14/2020 0828   GFRAA 57 (L) 04/21/2020 0817   GFRAA >60 01/14/2020 0828    No results found for: SPEP, UPEP  Lab Results  Component Value Date   WBC 4.9 06/16/2020   NEUTROABS 2.2 06/16/2020   HGB 11.5 (L) 06/16/2020   HCT 34.9 (L) 06/16/2020   MCV 83.3 06/16/2020   PLT 309 06/16/2020      Chemistry      Component Value Date/Time   NA 142 06/16/2020 0743   NA 143 07/14/2017 1245   K 3.8 06/16/2020 0743   K 3.3 (L) 07/14/2017 1245   CL 112 (H) 06/16/2020 0743   CO2 21 (L) 06/16/2020 0743   CO2 20 (L) 07/14/2017 1245   BUN 13 06/16/2020 0743   BUN 9.3 07/14/2017 1245   CREATININE 0.98 06/16/2020 0743   CREATININE 1.07 (H) 01/14/2020 0828   CREATININE 0.8 07/14/2017 1245      Component Value Date/Time   CALCIUM 9.0 06/16/2020 0743   CALCIUM 8.4 07/14/2017 1245   ALKPHOS 59 06/16/2020  0743   ALKPHOS 78 07/14/2017 1245   AST 17 06/16/2020 0743   AST 21 01/14/2020 0828   AST 21 07/14/2017 1245   ALT 10 06/16/2020 0743   ALT 15 01/14/2020 0828   ALT 21 07/14/2017 1245   BILITOT 0.5 06/16/2020 0743   BILITOT 0.8 01/14/2020 0828   BILITOT 0.43 07/14/2017 1245

## 2020-06-16 NOTE — Patient Instructions (Signed)
[facility] Discharge Instructions for Patients Receiving Chemotherapy  Today you received the following chemotherapy agent: Darzalex Faspro  To help prevent nausea and vomiting after your treatment, we encourage you to take your nausea medication as directed by your MD.   If you develop nausea and vomiting that is not controlled by your nausea medication, call the clinic.   BELOW ARE SYMPTOMS THAT SHOULD BE REPORTED IMMEDIATELY:  *FEVER GREATER THAN 100.5 F  *CHILLS WITH OR WITHOUT FEVER  NAUSEA AND VOMITING THAT IS NOT CONTROLLED WITH YOUR NAUSEA MEDICATION  *UNUSUAL SHORTNESS OF BREATH  *UNUSUAL BRUISING OR BLEEDING  TENDERNESS IN MOUTH AND THROAT WITH OR WITHOUT PRESENCE OF ULCERS  *URINARY PROBLEMS  *BOWEL PROBLEMS  UNUSUAL RASH Items with * indicate a potential emergency and should be followed up as soon as possible.  Feel free to call the clinic should you have any questions or concerns. The clinic phone number is [phone].  Please show the [place] at check-in to the Emergency Department and triage nurse.  Zoledronic Acid injection (Hypercalcemia, Oncology) What is this medicine? ZOLEDRONIC ACID (ZOE le dron ik AS id) lowers the amount of calcium loss from bone. It is used to treat too much calcium in your blood from cancer. It is also used to prevent complications of cancer that has spread to the bone. This medicine may be used for other purposes; ask your health care provider or pharmacist if you have questions. COMMON BRAND NAME(S): Zometa What should I tell my health care provider before I take this medicine? They need to know if you have any of these conditions:  aspirin-sensitive asthma  cancer, especially if you are receiving medicines used to treat cancer  dental disease or wear dentures  infection  kidney disease  receiving corticosteroids like dexamethasone or prednisone  an unusual or allergic reaction to zoledronic  acid, other medicines, foods, dyes, or preservatives  pregnant or trying to get pregnant  breast-feeding How should I use this medicine? This medicine is for infusion into a vein. It is given by a health care professional in a hospital or clinic setting. Talk to your pediatrician regarding the use of this medicine in children. Special care may be needed. Overdosage: If you think you have taken too much of this medicine contact a poison control center or emergency room at once. NOTE: This medicine is only for you. Do not share this medicine with others. What if I miss a dose? It is important not to miss your dose. Call your doctor or health care professional if you are unable to keep an appointment. What may interact with this medicine?  certain antibiotics given by injection  NSAIDs, medicines for pain and inflammation, like ibuprofen or naproxen  some diuretics like bumetanide, furosemide  teriparatide  thalidomide This list may not describe all possible interactions. Give your health care provider a list of all the medicines, herbs, non-prescription drugs, or dietary supplements you use. Also tell them if you smoke, drink alcohol, or use illegal drugs. Some items may interact with your medicine. What should I watch for while using this medicine? Visit your doctor or health care professional for regular checkups. It may be some time before you see the benefit from this medicine. Do not stop taking your medicine unless your doctor tells you to. Your doctor may order blood tests or other tests to see how you are doing. Women should inform their doctor if they wish to become pregnant or think  they might be pregnant. There is a potential for serious side effects to an unborn child. Talk to your health care professional or pharmacist for more information. You should make sure that you get enough calcium and vitamin D while you are taking this medicine. Discuss the foods you eat and the  vitamins you take with your health care professional. Some people who take this medicine have severe bone, joint, and/or muscle pain. This medicine may also increase your risk for jaw problems or a broken thigh bone. Tell your doctor right away if you have severe pain in your jaw, bones, joints, or muscles. Tell your doctor if you have any pain that does not go away or that gets worse. Tell your dentist and dental surgeon that you are taking this medicine. You should not have major dental surgery while on this medicine. See your dentist to have a dental exam and fix any dental problems before starting this medicine. Take good care of your teeth while on this medicine. Make sure you see your dentist for regular follow-up appointments. What side effects may I notice from receiving this medicine? Side effects that you should report to your doctor or health care professional as soon as possible:  allergic reactions like skin rash, itching or hives, swelling of the face, lips, or tongue  anxiety, confusion, or depression  breathing problems  changes in vision  eye pain  feeling faint or lightheaded, falls  jaw pain, especially after dental work  mouth sores  muscle cramps, stiffness, or weakness  redness, blistering, peeling or loosening of the skin, including inside the mouth  trouble passing urine or change in the amount of urine Side effects that usually do not require medical attention (report to your doctor or health care professional if they continue or are bothersome):  bone, joint, or muscle pain  constipation  diarrhea  fever  hair loss  irritation at site where injected  loss of appetite  nausea, vomiting  stomach upset  trouble sleeping  trouble swallowing  weak or tired This list may not describe all possible side effects. Call your doctor for medical advice about side effects. You may report side effects to FDA at 1-800-FDA-1088. Where should I keep my  medicine? This drug is given in a hospital or clinic and will not be stored at home. NOTE: This sheet is a summary. It may not cover all possible information. If you have questions about this medicine, talk to your doctor, pharmacist, or health care provider.  2020 Elsevier/Gold Standard (2013-12-22 14:19:39)

## 2020-06-16 NOTE — Assessment & Plan Note (Signed)
I have reviewed recent myeloma panel with the patient She has achieved maximum response to therapy, currently at [facility] I recommend continue on Faspro only We will continue myeloma panel once a month She agreed with the plan of care She will continue Zometa every 3 months along with calcium and vitamin D supplement

## 2020-06-17 LAB — MULTIPLE MYELOMA PANEL, SERUM
Albumin SerPl Elph-Mcnc: 3.3 g/dL (ref 2.9–4.4)
Albumin/Glob SerPl: 1.4 (ref 0.7–1.7)
Alpha 1: 0.3 g/dL (ref 0.0–0.4)
Alpha2 Glob SerPl Elph-Mcnc: 0.9 g/dL (ref 0.4–1.0)
B-Globulin SerPl Elph-Mcnc: 1 g/dL (ref 0.7–1.3)
Gamma Glob SerPl Elph-Mcnc: 0.3 g/dL — ABNORMAL LOW (ref 0.4–1.8)
Globulin, Total: 2.5 g/dL (ref 2.2–3.9)
IgA: 6 mg/dL — ABNORMAL LOW (ref 87–352)
IgG (Immunoglobin G), Serum: 365 mg/dL — ABNORMAL LOW (ref 586–1602)
IgM (Immunoglobulin M), Srm: 7 mg/dL — ABNORMAL LOW (ref 26–217)
M Protein SerPl Elph-Mcnc: 0.2 g/dL — ABNORMAL HIGH
Total Protein ELP: 5.8 g/dL — ABNORMAL LOW (ref 6.0–8.5)

## 2020-06-17 LAB — KAPPA/LAMBDA LIGHT CHAINS
Kappa free light chain: 11 mg/L (ref 3.3–19.4)
Kappa, lambda light chain ratio: 4.78 — ABNORMAL HIGH (ref 0.26–1.65)
Lambda free light chains: 2.3 mg/L — ABNORMAL LOW (ref 5.7–26.3)

## 2020-07-14 ENCOUNTER — Inpatient Hospital Stay: Payer: Medicare Other

## 2020-07-14 ENCOUNTER — Inpatient Hospital Stay: Payer: Medicare Other | Attending: Hematology and Oncology | Admitting: Hematology and Oncology

## 2020-07-14 ENCOUNTER — Telehealth: Payer: Self-pay | Admitting: Hematology and Oncology

## 2020-07-14 ENCOUNTER — Other Ambulatory Visit: Payer: Self-pay

## 2020-07-14 ENCOUNTER — Ambulatory Visit: Payer: Medicare Other

## 2020-07-14 ENCOUNTER — Other Ambulatory Visit: Payer: Self-pay | Admitting: Hematology and Oncology

## 2020-07-14 ENCOUNTER — Encounter: Payer: Self-pay | Admitting: Hematology and Oncology

## 2020-07-14 DIAGNOSIS — J984 Other disorders of lung: Secondary | ICD-10-CM | POA: Insufficient documentation

## 2020-07-14 DIAGNOSIS — C9001 Multiple myeloma in remission: Secondary | ICD-10-CM

## 2020-07-14 DIAGNOSIS — Z9484 Stem cells transplant status: Secondary | ICD-10-CM | POA: Insufficient documentation

## 2020-07-14 DIAGNOSIS — D638 Anemia in other chronic diseases classified elsewhere: Secondary | ICD-10-CM

## 2020-07-14 DIAGNOSIS — Z885 Allergy status to narcotic agent status: Secondary | ICD-10-CM | POA: Diagnosis not present

## 2020-07-14 DIAGNOSIS — Z886 Allergy status to analgesic agent status: Secondary | ICD-10-CM | POA: Diagnosis not present

## 2020-07-14 DIAGNOSIS — E876 Hypokalemia: Secondary | ICD-10-CM | POA: Diagnosis not present

## 2020-07-14 DIAGNOSIS — Z880 Allergy status to penicillin: Secondary | ICD-10-CM | POA: Diagnosis not present

## 2020-07-14 DIAGNOSIS — Z5112 Encounter for antineoplastic immunotherapy: Secondary | ICD-10-CM | POA: Diagnosis not present

## 2020-07-14 DIAGNOSIS — T451X5A Adverse effect of antineoplastic and immunosuppressive drugs, initial encounter: Secondary | ICD-10-CM | POA: Insufficient documentation

## 2020-07-14 DIAGNOSIS — R911 Solitary pulmonary nodule: Secondary | ICD-10-CM | POA: Insufficient documentation

## 2020-07-14 DIAGNOSIS — R110 Nausea: Secondary | ICD-10-CM | POA: Insufficient documentation

## 2020-07-14 DIAGNOSIS — Z7189 Other specified counseling: Secondary | ICD-10-CM

## 2020-07-14 DIAGNOSIS — Z79899 Other long term (current) drug therapy: Secondary | ICD-10-CM | POA: Insufficient documentation

## 2020-07-14 DIAGNOSIS — D6481 Anemia due to antineoplastic chemotherapy: Secondary | ICD-10-CM | POA: Diagnosis not present

## 2020-07-14 DIAGNOSIS — Z881 Allergy status to other antibiotic agents status: Secondary | ICD-10-CM | POA: Diagnosis not present

## 2020-07-14 LAB — COMPREHENSIVE METABOLIC PANEL
ALT: 10 U/L (ref 0–44)
AST: 17 U/L (ref 15–41)
Albumin: 3.9 g/dL (ref 3.5–5.0)
Alkaline Phosphatase: 67 U/L (ref 38–126)
Anion gap: 8 (ref 5–15)
BUN: 9 mg/dL (ref 8–23)
CO2: 24 mmol/L (ref 22–32)
Calcium: 9.2 mg/dL (ref 8.9–10.3)
Chloride: 111 mmol/L (ref 98–111)
Creatinine, Ser: 0.95 mg/dL (ref 0.44–1.00)
GFR, Estimated: >60 mL/min (ref >60)
Glucose, Bld: 75 mg/dL (ref 70–99)
Potassium: 3.4 mmol/L — ABNORMAL LOW (ref 3.5–5.1)
Sodium: 143 mmol/L (ref 135–145)
Total Bilirubin: 0.5 mg/dL (ref 0.3–1.2)
Total Protein: 6.8 g/dL (ref 6.5–8.1)

## 2020-07-14 LAB — CBC WITH DIFFERENTIAL/PLATELET
Abs Immature Granulocytes: 0.01 10*3/uL (ref 0.00–0.07)
Basophils Absolute: 0.1 10*3/uL (ref 0.0–0.1)
Basophils Relative: 2 %
Eosinophils Absolute: 0.6 10*3/uL — ABNORMAL HIGH (ref 0.0–0.5)
Eosinophils Relative: 11 %
HCT: 35.1 % — ABNORMAL LOW (ref 36.0–46.0)
Hemoglobin: 11.5 g/dL — ABNORMAL LOW (ref 12.0–15.0)
Immature Granulocytes: 0 %
Lymphocytes Relative: 38 %
Lymphs Abs: 2 10*3/uL (ref 0.7–4.0)
MCH: 26.9 pg (ref 26.0–34.0)
MCHC: 32.8 g/dL (ref 30.0–36.0)
MCV: 82 fL (ref 80.0–100.0)
Monocytes Absolute: 0.5 10*3/uL (ref 0.1–1.0)
Monocytes Relative: 10 %
Neutro Abs: 2.1 10*3/uL (ref 1.7–7.7)
Neutrophils Relative %: 39 %
Platelets: 323 10*3/uL (ref 150–400)
RBC: 4.28 MIL/uL (ref 3.87–5.11)
RDW: 14 % (ref 11.5–15.5)
WBC: 5.2 10*3/uL (ref 4.0–10.5)
nRBC: 0 % (ref 0.0–0.2)

## 2020-07-14 MED ORDER — DEXAMETHASONE 4 MG PO TABS
ORAL_TABLET | ORAL | Status: AC
  Filled 2020-07-14: qty 2

## 2020-07-14 MED ORDER — ACETAMINOPHEN 325 MG PO TABS
ORAL_TABLET | ORAL | Status: AC
  Filled 2020-07-14: qty 2

## 2020-07-14 MED ORDER — MONTELUKAST SODIUM 10 MG PO TABS
10.0000 mg | ORAL_TABLET | Freq: Once | ORAL | Status: AC
  Administered 2020-07-14: 10 mg via ORAL

## 2020-07-14 MED ORDER — DARATUMUMAB-HYALURONIDASE-FIHJ 1800-30000 MG-UT/15ML ~~LOC~~ SOLN
1800.0000 mg | Freq: Once | SUBCUTANEOUS | Status: AC
  Administered 2020-07-14: 1800 mg via SUBCUTANEOUS
  Filled 2020-07-14: qty 15

## 2020-07-14 MED ORDER — ACETAMINOPHEN 325 MG PO TABS
650.0000 mg | ORAL_TABLET | Freq: Once | ORAL | Status: AC
  Administered 2020-07-14: 650 mg via ORAL

## 2020-07-14 MED ORDER — MONTELUKAST SODIUM 10 MG PO TABS
ORAL_TABLET | ORAL | Status: AC
  Filled 2020-07-14: qty 1

## 2020-07-14 MED ORDER — DEXAMETHASONE 4 MG PO TABS
8.0000 mg | ORAL_TABLET | Freq: Once | ORAL | Status: AC
  Administered 2020-07-14: 8 mg via ORAL

## 2020-07-14 NOTE — Progress Notes (Signed)
[facility] in stable condition and with no signs of acute distress.    

## 2020-07-14 NOTE — Telephone Encounter (Signed)
Scheduled appointments per 12/6 sch msg. Spoke to patient who is aware of appointments dates and times. Gave patient calendar print out.

## 2020-07-14 NOTE — Assessment & Plan Note (Signed)
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  Observe  

## 2020-07-14 NOTE — Assessment & Plan Note (Signed)
This is likely related to recent diarrhea She is not symptomatic Observe only 

## 2020-07-14 NOTE — Progress Notes (Signed)
[facility] OFFICE PROGRESS NOTE  Patient Care Team: Josetta Huddle, MD as PCP - General (Internal Medicine) Ginette Pitman, MD as Consulting Physician (Hematology and Oncology) Hessie Dibble, MD as Consulting Physician (Hematology and Oncology)  ASSESSMENT & PLAN:  Multiple myeloma in remission [facility]) I have reviewed recent myeloma panel with the patient I have noticed the M protein and light chains are slightly trending up She is not symptomatic Her anemia is not related  for now, I recommend we continue on Faspro maintenance treatment We will continue myeloma panel once a month If her M protein start to trend higher than 0.5, we will start myeloma work-up again and consider adding additional therapy She agreed with the plan of care She will continue Zometa every 3 months along with calcium and vitamin D supplement  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  Observe   Hypokalemia This is likely related to recent diarrhea She is not symptomatic Observe only   No orders of the defined types were placed in this encounter.   All questions were answered. The patient knows to call the clinic with any problems, questions or concerns. The total time spent in the appointment was 20 minutes encounter with patients including review of chart and various tests results, discussions about plan of care and coordination of care plan   [name], MD 07/14/2020 8:30 AM  INTERVAL HISTORY: Please see below for problem oriented charting. She returns for further follow-up She had one episode of nausea and diarrhea but that has resolved No recent fever chills No new bone pain Her appetite is fair  SUMMARY OF ONCOLOGIC HISTORY: Oncology History Overview Note   M-protein 0.69 gm/dl IFIX - IgG, Kappa IgG - 868 IgA - 19 IgM - < 20 Kappa - 21 Lambda - 5.7  09/06/2014 -  Bone marrow aspirate and biopsy:   Normocellular marrow for age (40%) with a small monoclonal plasma cell population (1% on aspirate). Karyotype 35, XX  FISH Negative for myeloma associated changes  09/12/2014 - PET/CT  Two regions that are concerning for disease, one adjacent/involving the left ninth rib and one in the marrow of the right femur, in this patient with history of plasmacytoma.    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   04/01/2015 Bone Marrow Transplant   She received melphalan chemotherapy on 03/31/2015 followed  by autologous stem cell transplant the day after   04/03/2015 - 04/18/2015 Hospital Admission   The patient was admitted to the hospital at [facility] for management related to complication from stem cell transplant. She had significant nausea requiring intravenous anti-emetics.   07/17/2015 -  Chemotherapy   She started maintenance Revlimid and monthly zometa, then every 3 months   06/01/2018 Imaging   DEXA scan showed  bone density T score in femur -2.3   04/20/2019 PET scan   1. No FDG avid osseous lesions or mass identified to suggest metabolically active lesion of myeloma or plasmacytoma. 2. Small nodular density within the paravertebral right lower lobe exhibits mild to moderate increased uptake within SUV max of 3.38. This is indeterminate. Review of CT chest from 10/05/2018 shows a corresponding Lung nodule in this area measuring the same. Small pulmonary neoplasm cannot be excluded. 3. Indeterminate, focal area of increased uptake is identified within the thoracic canal. Indeterminate favored to represent benign physiologic CNS activity.     04/30/2019 -  Chemotherapy   The patient had dexamethasone (DECADRON) tablet 20 mg, 20 mg (100 % of original dose 20 mg), Oral,  Once, 15 of 18 cycles Dose modification: 20 mg (original dose 20 mg, Cycle 1), 12 mg (60 % of original dose 20 mg, Cycle 2, Reason: Dose Not Tolerated), 10 mg (50 % of original dose 20 mg, Cycle 4, Reason: Provider Judgment), 20 mg (original dose 20 mg, Cycle 1), 8 mg (40 % of original dose 20 mg, Cycle 15, Reason: Dose Not Tolerated) Administration: 20 mg (05/07/2019), 20 mg (05/14/2019), 20 mg (05/21/2019), 20 mg (05/28/2019), 20 mg (06/04/2019), 12 mg (06/11/2019), 12 mg (06/18/2019), 12 mg (06/25/2019), 12 mg (07/09/2019), 10 mg (07/23/2019), 10 mg (08/06/2019), 10 mg (08/20/2019), 10 mg (09/03/2019), 10 mg (09/17/2019), 10 mg (10/01/2019), 12 mg (10/15/2019), 12 mg (11/12/2019), 12 mg (12/10/2019), 20 mg (04/30/2019), 12 mg (01/14/2020), 12 mg (02/18/2020), 12 mg (03/17/2020), 12 mg (04/21/2020), 12 mg (05/19/2020), 8 mg (06/16/2020) daratumumab-hyaluronidase-fihj (DARZALEX FASPRO) 1800-30000 MG-UT/15ML chemo SQ injection 1,800 mg, 1,800 mg, Subcutaneous,  Once, 15 of 18 cycles Administration: 1,800 mg (04/30/2019), 1,800 mg (05/07/2019), 1,800 mg (05/14/2019), 1,800 mg (05/21/2019), 1,800 mg (05/28/2019), 1,800 mg (06/04/2019), 1,800 mg (06/11/2019), 1,800 mg  (06/18/2019), 1,800 mg (06/25/2019), 1,800 mg (07/09/2019), 1,800 mg (10/15/2019), 1,800 mg (07/23/2019), 1,800 mg (08/06/2019), 1,800 mg (08/20/2019), 1,800 mg (09/03/2019), 1,800 mg (09/17/2019), 1,800 mg (10/01/2019), 1,800 mg (11/12/2019), 1,800 mg (12/10/2019), 1,800 mg (01/14/2020), 1,800 mg (02/18/2020), 1,800 mg (03/17/2020), 1,800 mg (04/21/2020), 1,800 mg (05/19/2020), 1,800 mg (06/16/2020)  for chemotherapy treatment.    04/30/2019 - 07/09/2019 Chemotherapy   The patient had bortezomib SQ (VELCADE) chemo injection 1.75 mg, 1.3 mg/m2 = 1.75 mg, Subcutaneous,  Once, 10 of 11 cycles Administration: 1.75 mg (04/30/2019), 1.75 mg (05/07/2019), 1.75 mg (05/14/2019), 1.75 mg (05/21/2019), 1.75 mg (05/28/2019), 1.75 mg (06/04/2019), 1.75 mg (06/11/2019), 1.75 mg (06/18/2019), 1.75 mg (06/25/2019), 1.75 mg (07/09/2019)  for chemotherapy treatment.      REVIEW OF SYSTEMS:   Constitutional: Denies fevers, chills or abnormal weight loss Eyes: Denies blurriness of vision Ears, nose, mouth, throat, and face: Denies mucositis or sore throat Respiratory: Denies cough, dyspnea or wheezes Cardiovascular: Denies palpitation, chest discomfort or lower extremity swelling Skin: Denies abnormal skin rashes Lymphatics: Denies new lymphadenopathy or easy bruising Neurological:Denies numbness, tingling or new weaknesses Behavioral/Psych: Mood is stable, no new changes  All other systems were reviewed with the patient and are negative.  I have reviewed  the past medical history, past surgical history, social history and family history with the patient and they are unchanged from previous note.  ALLERGIES:  is allergic to augmentin [amoxicillin-pot clavulanate], albuterol, codeine, ibuprofen, nsaids, and tolmetin.  MEDICATIONS:  Current Outpatient Medications  Medication Sig Dispense Refill  . acetaminophen (TYLENOL) 500 MG tablet Take 500-1,000 mg by mouth every 6 (six) hours as needed for mild pain, moderate pain, fever or  headache.    Marland Kitchen acyclovir (ZOVIRAX) 400 MG tablet Take 1 tablet (400 mg total) by mouth 2 (two) times daily. 180 tablet 3  . aspirin EC 81 MG tablet Take 81 mg by mouth daily with breakfast.    . calcium carbonate (OSCAL) 1500 (600 Ca) MG TABS tablet Take 1,500 mg by mouth daily with breakfast.    . Calcium Carbonate 500 MG CHEW Chew 500 mg by mouth as needed for indigestion.    . Cholecalciferol (VITAMIN D3) 2000 units capsule Take 2,000 Units by mouth daily.     . famotidine (PEPCID) 20 MG tablet Take 20 mg by mouth daily.    . Multiple Vitamins-Minerals (CENTRUM SILVER PO) Take 1 tablet by mouth daily.     Marland Kitchen omeprazole (PRILOSEC) 20 MG capsule Take 1 capsule (20 mg total) by mouth daily. 90 capsule 11  . ondansetron (ZOFRAN) 8 MG tablet TAKE 1 TABLET(8 MG) BY MOUTH EVERY 8 HOURS AS NEEDED FOR NAUSEA OR VOMITING 60 tablet 11  . senna-docusate (SENOKOT-S) 8.6-50 MG tablet Take 1-2 tablets by mouth daily as needed for mild constipation or moderate constipation.     . sodium chloride (OCEAN) 0.65 % SOLN nasal spray Place 1 spray into both nostrils as needed for congestion.    . triamterene-hydrochlorothiazide (MAXZIDE-25) 37.5-25 MG tablet Take 0.5 tablets by mouth daily.    . vitamin B-12 (CYANOCOBALAMIN) 500 MCG tablet Take 500 mcg by mouth daily.     No current facility-administered medications for this visit.   Facility-Administered Medications Ordered in Other Visits  Medication Dose Route Frequency Provider Last Rate Last Admin  . 0.9 %  sodium chloride infusion   Intravenous Once Alvy Bimler, Chrissie Dacquisto, MD        PHYSICAL EXAMINATION: ECOG PERFORMANCE STATUS: 1 - Symptomatic but completely ambulatory  Vitals:   07/14/20 0802  BP: (!) 147/90  Pulse: 90  Resp: 17  Temp: 100 F (37.8 C)  SpO2: 100%   Filed Weights   07/14/20 0802  Weight: 112 lb 6.4 oz (51 kg)    GENERAL:alert, no distress and comfortable NEURO: alert & oriented x 3 with fluent speech, no focal motor/sensory  deficits  LABORATORY DATA:  I have reviewed the data as listed    Component Value Date/Time   NA 143 07/14/2020 0749   NA 143 07/14/2017 1245   K 3.4 (L) 07/14/2020 0749   K 3.3 (L) 07/14/2017 1245   CL 111 07/14/2020 0749   CO2 24 07/14/2020 0749   CO2 20 (L) 07/14/2017 1245   GLUCOSE 75 07/14/2020 0749   GLUCOSE 106 07/14/2017 1245   BUN 9 07/14/2020 0749   BUN 9.3 07/14/2017 1245   CREATININE 0.95 07/14/2020 0749   CREATININE 1.07 (H) 01/14/2020 0828   CREATININE 0.8 07/14/2017 1245   CALCIUM 9.2 07/14/2020 0749   CALCIUM 8.4 07/14/2017 1245   PROT 6.8 07/14/2020 0749   PROT 6.0 07/14/2017 1245   PROT 6.2 (L) 07/14/2017 1245   ALBUMIN 3.9 07/14/2020 0749   ALBUMIN 3.6 07/14/2017 1245   AST 17 07/14/2020  0749   AST 21 01/14/2020 0828   AST 21 07/14/2017 1245   ALT 10 07/14/2020 0749   ALT 15 01/14/2020 0828   ALT 21 07/14/2017 1245   ALKPHOS 67 07/14/2020 0749   ALKPHOS 78 07/14/2017 1245   BILITOT 0.5 07/14/2020 0749   BILITOT 0.8 01/14/2020 0828   BILITOT 0.43 07/14/2017 1245   GFRNONAA >60 07/14/2020 0749   GFRNONAA 53 (L) 01/14/2020 0828   GFRAA 57 (L) 04/21/2020 0817   GFRAA >60 01/14/2020 0828    No results found for: SPEP, UPEP  Lab Results  Component Value Date   WBC 5.2 07/14/2020   NEUTROABS 2.1 07/14/2020   HGB 11.5 (L) 07/14/2020   HCT 35.1 (L) 07/14/2020   MCV 82.0 07/14/2020   PLT 323 07/14/2020      Chemistry      Component Value Date/Time   NA 143 07/14/2020 0749   NA 143 07/14/2017 1245   K 3.4 (L) 07/14/2020 0749   K 3.3 (L) 07/14/2017 1245   CL 111 07/14/2020 0749   CO2 24 07/14/2020 0749   CO2 20 (L) 07/14/2017 1245   BUN 9 07/14/2020 0749   BUN 9.3 07/14/2017 1245   CREATININE 0.95 07/14/2020 0749   CREATININE 1.07 (H) 01/14/2020 0828   CREATININE 0.8 07/14/2017 1245      Component Value Date/Time   CALCIUM 9.2 07/14/2020 0749   CALCIUM 8.4 07/14/2017 1245   ALKPHOS 67 07/14/2020 0749   ALKPHOS 78 07/14/2017 1245    AST 17 07/14/2020 0749   AST 21 01/14/2020 0828   AST 21 07/14/2017 1245   ALT 10 07/14/2020 0749   ALT 15 01/14/2020 0828   ALT 21 07/14/2017 1245   BILITOT 0.5 07/14/2020 0749   BILITOT 0.8 01/14/2020 0828   BILITOT 0.43 07/14/2017 1245

## 2020-07-14 NOTE — Assessment & Plan Note (Signed)
I have reviewed recent myeloma panel with the patient I have noticed the M protein and light chains are slightly trending up She is not symptomatic Her anemia is not related  for now, I recommend we continue on Faspro maintenance treatment We will continue myeloma panel once a month If her M protein start to trend higher than 0.5, we will start myeloma work-up again and consider adding additional therapy She agreed with the plan of care She will continue Zometa every 3 months along with calcium and vitamin D supplement

## 2020-07-14 NOTE — Patient Instructions (Signed)
[facility] Discharge Instructions for Patients Receiving Chemotherapy  Today you received the following chemotherapy agents: Darzalex Faspro SQ  To help prevent nausea and vomiting after your treatment, we encourage you to take your nausea medication  as prescribed.    If you develop nausea and vomiting that is not controlled by your nausea medication, call the clinic.   BELOW ARE SYMPTOMS THAT SHOULD BE REPORTED IMMEDIATELY:  *FEVER GREATER THAN 100.5 F  *CHILLS WITH OR WITHOUT FEVER  NAUSEA AND VOMITING THAT IS NOT CONTROLLED WITH YOUR NAUSEA MEDICATION  *UNUSUAL SHORTNESS OF BREATH  *UNUSUAL BRUISING OR BLEEDING  TENDERNESS IN MOUTH AND THROAT WITH OR WITHOUT PRESENCE OF ULCERS  *URINARY PROBLEMS  *BOWEL PROBLEMS  UNUSUAL RASH Items with * indicate a potential emergency and should be followed up as soon as possible.  Feel free to call the clinic should you have any questions or concerns. The clinic phone number is [phone].  Please show the [facility] at check-in to the Emergency Department and triage nurse.

## 2020-07-15 LAB — MULTIPLE MYELOMA PANEL, SERUM
Albumin SerPl Elph-Mcnc: 3.6 g/dL (ref 2.9–4.4)
Albumin/Glob SerPl: 1.4 (ref 0.7–1.7)
Alpha 1: 0.3 g/dL (ref 0.0–0.4)
Alpha2 Glob SerPl Elph-Mcnc: 0.9 g/dL (ref 0.4–1.0)
B-Globulin SerPl Elph-Mcnc: 1 g/dL (ref 0.7–1.3)
Gamma Glob SerPl Elph-Mcnc: 0.4 g/dL (ref 0.4–1.8)
Globulin, Total: 2.6 g/dL (ref 2.2–3.9)
IgA: 7 mg/dL — ABNORMAL LOW (ref 87–352)
IgG (Immunoglobin G), Serum: 507 mg/dL — ABNORMAL LOW (ref 586–1602)
IgM (Immunoglobulin M), Srm: 6 mg/dL — ABNORMAL LOW (ref 26–217)
M Protein SerPl Elph-Mcnc: 0.2 g/dL — ABNORMAL HIGH
Total Protein ELP: 6.2 g/dL (ref 6.0–8.5)

## 2020-07-15 LAB — KAPPA/LAMBDA LIGHT CHAINS
Kappa free light chain: 17.7 mg/L (ref 3.3–19.4)
Kappa, lambda light chain ratio: 8.05 — ABNORMAL HIGH (ref 0.26–1.65)
Lambda free light chains: 2.2 mg/L — ABNORMAL LOW (ref 5.7–26.3)

## 2020-08-12 ENCOUNTER — Other Ambulatory Visit: Payer: Self-pay | Admitting: Hematology and Oncology

## 2020-08-12 ENCOUNTER — Inpatient Hospital Stay: Payer: Medicare Other | Admitting: Hematology and Oncology

## 2020-08-12 ENCOUNTER — Ambulatory Visit: Payer: Medicare Other

## 2020-08-12 ENCOUNTER — Inpatient Hospital Stay: Payer: Medicare Other

## 2020-08-12 ENCOUNTER — Other Ambulatory Visit: Payer: Self-pay

## 2020-08-12 ENCOUNTER — Inpatient Hospital Stay: Payer: Medicare Other | Attending: Hematology and Oncology

## 2020-08-12 VITALS — Temp 98.2°F

## 2020-08-12 DIAGNOSIS — C9001 Multiple myeloma in remission: Secondary | ICD-10-CM

## 2020-08-12 DIAGNOSIS — Z880 Allergy status to penicillin: Secondary | ICD-10-CM | POA: Diagnosis not present

## 2020-08-12 DIAGNOSIS — H5789 Other specified disorders of eye and adnexa: Secondary | ICD-10-CM | POA: Diagnosis not present

## 2020-08-12 DIAGNOSIS — Z9221 Personal history of antineoplastic chemotherapy: Secondary | ICD-10-CM | POA: Insufficient documentation

## 2020-08-12 DIAGNOSIS — Z923 Personal history of irradiation: Secondary | ICD-10-CM | POA: Diagnosis not present

## 2020-08-12 DIAGNOSIS — E876 Hypokalemia: Secondary | ICD-10-CM | POA: Diagnosis not present

## 2020-08-12 DIAGNOSIS — Z9484 Stem cells transplant status: Secondary | ICD-10-CM | POA: Insufficient documentation

## 2020-08-12 DIAGNOSIS — Z881 Allergy status to other antibiotic agents status: Secondary | ICD-10-CM | POA: Diagnosis not present

## 2020-08-12 DIAGNOSIS — F419 Anxiety disorder, unspecified: Secondary | ICD-10-CM | POA: Insufficient documentation

## 2020-08-12 DIAGNOSIS — D6481 Anemia due to antineoplastic chemotherapy: Secondary | ICD-10-CM | POA: Diagnosis not present

## 2020-08-12 DIAGNOSIS — E860 Dehydration: Secondary | ICD-10-CM

## 2020-08-12 DIAGNOSIS — Z886 Allergy status to analgesic agent status: Secondary | ICD-10-CM | POA: Diagnosis not present

## 2020-08-12 DIAGNOSIS — D638 Anemia in other chronic diseases classified elsewhere: Secondary | ICD-10-CM | POA: Diagnosis not present

## 2020-08-12 DIAGNOSIS — Z7189 Other specified counseling: Secondary | ICD-10-CM

## 2020-08-12 DIAGNOSIS — Z885 Allergy status to narcotic agent status: Secondary | ICD-10-CM | POA: Diagnosis not present

## 2020-08-12 DIAGNOSIS — I10 Essential (primary) hypertension: Secondary | ICD-10-CM | POA: Insufficient documentation

## 2020-08-12 DIAGNOSIS — Z5112 Encounter for antineoplastic immunotherapy: Secondary | ICD-10-CM | POA: Insufficient documentation

## 2020-08-12 DIAGNOSIS — Z79899 Other long term (current) drug therapy: Secondary | ICD-10-CM | POA: Diagnosis not present

## 2020-08-12 DIAGNOSIS — J984 Other disorders of lung: Secondary | ICD-10-CM | POA: Insufficient documentation

## 2020-08-12 LAB — COMPREHENSIVE METABOLIC PANEL
ALT: 13 U/L (ref 0–44)
AST: 20 U/L (ref 15–41)
Albumin: 4 g/dL (ref 3.5–5.0)
Alkaline Phosphatase: 62 U/L (ref 38–126)
Anion gap: 9 (ref 5–15)
BUN: 9 mg/dL (ref 8–23)
CO2: 23 mmol/L (ref 22–32)
Calcium: 9.3 mg/dL (ref 8.9–10.3)
Chloride: 110 mmol/L (ref 98–111)
Creatinine, Ser: 0.89 mg/dL (ref 0.44–1.00)
GFR, Estimated: >60 mL/min (ref >60)
Glucose, Bld: 81 mg/dL (ref 70–99)
Potassium: 3.1 mmol/L — ABNORMAL LOW (ref 3.5–5.1)
Sodium: 142 mmol/L (ref 135–145)
Total Bilirubin: 0.7 mg/dL (ref 0.3–1.2)
Total Protein: 6.8 g/dL (ref 6.5–8.1)

## 2020-08-12 LAB — CBC WITH DIFFERENTIAL/PLATELET
Abs Immature Granulocytes: 0.01 10*3/uL (ref 0.00–0.07)
Basophils Absolute: 0.1 10*3/uL (ref 0.0–0.1)
Basophils Relative: 1 %
Eosinophils Absolute: 0.3 10*3/uL (ref 0.0–0.5)
Eosinophils Relative: 6 %
HCT: 35.3 % — ABNORMAL LOW (ref 36.0–46.0)
Hemoglobin: 11.4 g/dL — ABNORMAL LOW (ref 12.0–15.0)
Immature Granulocytes: 0 %
Lymphocytes Relative: 40 %
Lymphs Abs: 2 10*3/uL (ref 0.7–4.0)
MCH: 26.5 pg (ref 26.0–34.0)
MCHC: 32.3 g/dL (ref 30.0–36.0)
MCV: 82.1 fL (ref 80.0–100.0)
Monocytes Absolute: 0.5 10*3/uL (ref 0.1–1.0)
Monocytes Relative: 11 %
Neutro Abs: 2 10*3/uL (ref 1.7–7.7)
Neutrophils Relative %: 42 %
Platelets: 300 10*3/uL (ref 150–400)
RBC: 4.3 MIL/uL (ref 3.87–5.11)
RDW: 14 % (ref 11.5–15.5)
WBC: 4.8 10*3/uL (ref 4.0–10.5)
nRBC: 0 % (ref 0.0–0.2)

## 2020-08-12 MED ORDER — DEXAMETHASONE 4 MG PO TABS
8.0000 mg | ORAL_TABLET | Freq: Once | ORAL | Status: AC
  Administered 2020-08-12: 8 mg via ORAL

## 2020-08-12 MED ORDER — MONTELUKAST SODIUM 10 MG PO TABS
10.0000 mg | ORAL_TABLET | Freq: Once | ORAL | Status: AC
  Administered 2020-08-12: 10 mg via ORAL

## 2020-08-12 MED ORDER — ACETAMINOPHEN 325 MG PO TABS
650.0000 mg | ORAL_TABLET | Freq: Once | ORAL | Status: AC
  Administered 2020-08-12: 650 mg via ORAL

## 2020-08-12 MED ORDER — ACETAMINOPHEN 325 MG PO TABS
ORAL_TABLET | ORAL | Status: AC
  Filled 2020-08-12: qty 2

## 2020-08-12 MED ORDER — DARATUMUMAB-HYALURONIDASE-FIHJ 1800-30000 MG-UT/15ML ~~LOC~~ SOLN
1800.0000 mg | Freq: Once | SUBCUTANEOUS | Status: AC
  Administered 2020-08-12: 1800 mg via SUBCUTANEOUS
  Filled 2020-08-12: qty 15

## 2020-08-12 MED ORDER — DEXAMETHASONE 4 MG PO TABS
ORAL_TABLET | ORAL | Status: AC
  Filled 2020-08-12: qty 2

## 2020-08-12 MED ORDER — MONTELUKAST SODIUM 10 MG PO TABS
ORAL_TABLET | ORAL | Status: AC
  Filled 2020-08-12: qty 1

## 2020-08-12 NOTE — Patient Instructions (Signed)
Daratumumab injection What is this medicine? DARATUMUMAB (dar a toom ue mab) is a monoclonal antibody. It is used to treat multiple myeloma. This medicine may be used for other purposes; ask your health care provider or pharmacist if you have questions. COMMON BRAND NAME(S): DARZALEX What should I tell my health care provider before I take this medicine? They need to know if you have any of these conditions:  infection (especially a virus infection such as chickenpox, herpes, or hepatitis B virus)  lung or breathing disease  an unusual or allergic reaction to daratumumab, other medicines, foods, dyes, or preservatives  pregnant or trying to get pregnant  breast-feeding How should I use this medicine? This medicine is for infusion into a vein. It is given by a health care professional in a hospital or clinic setting. Talk to your pediatrician regarding the use of this medicine in children. Special care may be needed. Overdosage: If you think you have taken too much of this medicine contact a poison control center or emergency room at once. NOTE: This medicine is only for you. Do not share this medicine with others. What if I miss a dose? Keep appointments for follow-up doses as directed. It is important not to miss your dose. Call your doctor or health care professional if you are unable to keep an appointment. What may interact with this medicine? Interactions have not been studied. This list may not describe all possible interactions. Give your health care provider a list of all the medicines, herbs, non-prescription drugs, or dietary supplements you use. Also tell them if you smoke, drink alcohol, or use illegal drugs. Some items may interact with your medicine. What should I watch for while using this medicine? This drug may make you feel generally unwell. Report any side effects. Continue your course of treatment even though you feel ill unless your doctor tells you to stop. This  medicine can cause serious allergic reactions. To reduce your risk you may need to take medicine before treatment with this medicine. Take your medicine as directed. This medicine can affect the results of blood tests to match your blood type. These changes can last for up to 6 months after the final dose. Your healthcare provider will do blood tests to match your blood type before you start treatment. Tell all of your healthcare providers that you are being treated with this medicine before receiving a blood transfusion. This medicine can affect the results of some tests used to determine treatment response; extra tests may be needed to evaluate response. Do not become pregnant while taking this medicine or for 3 months after stopping it. Women should inform their doctor if they wish to become pregnant or think they might be pregnant. There is a potential for serious side effects to an unborn child. Talk to your health care professional or pharmacist for more information. What side effects may I notice from receiving this medicine? Side effects that you should report to your doctor or health care professional as soon as possible:  allergic reactions like skin rash, itching or hives, swelling of the face, lips, or tongue  breathing problems  chills  cough  dizziness  feeling faint or lightheaded  headache  low blood counts - this medicine may decrease the number of white blood cells, red blood cells and platelets. You may be at increased risk for infections and bleeding.  nausea, vomiting  shortness of breath  signs of decreased platelets or bleeding - bruising, pinpoint red spots on  the skin, black, tarry stools, blood in the urine  signs of decreased red blood cells - unusually weak or tired, feeling faint or lightheaded, falls  signs of infection - fever or chills, cough, sore throat, pain or difficulty passing urine  signs and symptoms of liver injury like dark yellow or brown  urine; general ill feeling or flu-like symptoms; light-colored stools; loss of appetite; right upper belly pain; unusually weak or tired; yellowing of the eyes or skin Side effects that usually do not require medical attention (report to your doctor or health care professional if they continue or are bothersome):  back pain  constipation  diarrhea  joint pain  muscle cramps  pain, tingling, numbness in the hands or feet  swelling of the ankles, feet, hands  tiredness  trouble sleeping This list may not describe all possible side effects. Call your doctor for medical advice about side effects. You may report side effects to FDA at 1-800-FDA-1088. Where should I keep my medicine? This drug is given in a hospital or clinic and will not be stored at home. NOTE: This sheet is a summary. It may not cover all possible information. If you have questions about this medicine, talk to your doctor, pharmacist, or health care provider.  2020 Elsevier/Gold Standard (2019-04-03 18:10:54)  

## 2020-08-13 ENCOUNTER — Encounter: Payer: Self-pay | Admitting: Hematology and Oncology

## 2020-08-13 LAB — KAPPA/LAMBDA LIGHT CHAINS
Kappa free light chain: 17.1 mg/L (ref 3.3–19.4)
Kappa, lambda light chain ratio: 7.77 — ABNORMAL HIGH (ref 0.26–1.65)
Lambda free light chains: 2.2 mg/L — ABNORMAL LOW (ref 5.7–26.3)

## 2020-08-13 NOTE — Assessment & Plan Note (Signed)
This is likely related to recent diarrhea She is not symptomatic Observe only 

## 2020-08-13 NOTE — Assessment & Plan Note (Signed)
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  Observe  

## 2020-08-13 NOTE — Progress Notes (Signed)
[facility] OFFICE PROGRESS NOTE  Patient Care Team: Josetta Huddle, MD as PCP - General (Internal Medicine) Ginette Pitman, MD as Consulting Physician (Hematology and Oncology) Hessie Dibble, MD as Consulting Physician (Hematology and Oncology)  ASSESSMENT & PLAN:  Multiple myeloma in remission [facility]) I have reviewed recent myeloma panel with the patient I have noticed the fluctuation M protein and light chains are slightly trending up She is not symptomatic Her anemia is not related  for now, I recommend we continue on Faspro maintenance treatment We will continue myeloma panel once a month If her M protein start to trend higher than 0.5, we will start myeloma work-up again and consider adding additional therapy She agreed with the plan of care She will continue Zometa every 3 months along with calcium and vitamin D supplement  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  Observe   Hypokalemia This is likely related to recent diarrhea She is not symptomatic Observe only   No orders of the defined types were placed in this encounter.   All questions were answered. The patient knows to call the clinic with any problems, questions or concerns. The total time spent in the appointment was 20 minutes encounter with patients including review of chart and various tests results, discussions about plan of care and coordination of care plan   [name], MD 08/13/2020 2:13 PM  INTERVAL HISTORY: Please see below for problem oriented charting. She returns for further follow-up and treatment She denies recent infection, fever or chills No new bone pain No recent dental issues  SUMMARY OF ONCOLOGIC HISTORY: Oncology History Overview Note   M-protein 0.69 gm/dl IFIX - IgG, Kappa IgG - 868 IgA - 19 IgM - < 20 Kappa - 21 Lambda - 5.7  09/06/2014 - Bone marrow  aspirate and biopsy:   Normocellular marrow for age (40%) with a small monoclonal plasma cell population (1% on aspirate). Karyotype 73, XX  FISH Negative for myeloma associated changes  09/12/2014 - PET/CT  Two regions that are concerning for disease, one adjacent/involving the left ninth rib and one in the marrow of the right femur, in this patient with history of plasmacytoma.    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   04/01/2015 Bone Marrow Transplant   She received melphalan chemotherapy on 03/31/2015 followed by autologous stem cell transplant the  day after   04/03/2015 - 04/18/2015 Hospital Admission   The patient was admitted to the hospital at [place] [facility] for management related to complication from stem cell transplant. She had significant nausea requiring intravenous anti-emetics.   07/17/2015 -  Chemotherapy   She started maintenance Revlimid and monthly zometa, then every 3 months   06/01/2018 Imaging   DEXA scan showed bone density T  score in femur -2.3   04/20/2019 PET scan   1. No FDG avid osseous lesions or mass identified to suggest metabolically active lesion of myeloma or plasmacytoma. 2. Small nodular density within the paravertebral right lower lobe exhibits mild to moderate increased uptake within SUV max of 3.38. This is indeterminate. Review of CT chest from 10/05/2018 shows a corresponding Lung nodule in this area measuring the same. Small pulmonary neoplasm cannot be excluded. 3. Indeterminate, focal area of increased uptake is identified within the thoracic canal. Indeterminate favored to represent benign physiologic CNS activity.     04/30/2019 -  Chemotherapy   The patient had dexamethasone (DECADRON) tablet 20 mg, 20 mg (100 % of original dose 20 mg), Oral,  Once, 17 of 18 cycles Dose modification: 20 mg (original dose 20 mg, Cycle 1), 12 mg (60 % of original dose 20 mg, Cycle 2, Reason: Dose Not Tolerated), 10 mg (50 % of original dose 20 mg, Cycle 4, Reason: Provider Judgment), 20 mg (original dose 20 mg, Cycle 1), 8 mg (40 % of original dose 20 mg, Cycle 15, Reason: Dose Not Tolerated) Administration: 20 mg (05/07/2019), 20 mg (05/14/2019), 20 mg (05/21/2019), 20 mg (05/28/2019), 20 mg (06/04/2019), 12 mg (06/11/2019), 12 mg (06/18/2019), 12 mg (06/25/2019), 12 mg (07/09/2019), 10 mg (07/23/2019), 10 mg (08/06/2019), 10 mg (08/20/2019), 10 mg (09/03/2019), 10 mg (09/17/2019), 10 mg (10/01/2019), 12 mg (10/15/2019), 12 mg (11/12/2019), 12 mg (12/10/2019), 20 mg (04/30/2019), 12 mg (01/14/2020), 12 mg (02/18/2020), 12 mg (03/17/2020), 12 mg (04/21/2020), 12 mg (05/19/2020), 8 mg (06/16/2020), 8 mg (07/14/2020), 8 mg (08/12/2020) daratumumab-hyaluronidase-fihj (DARZALEX FASPRO) 1800-30000 MG-UT/15ML chemo SQ injection 1,800 mg, 1,800 mg, Subcutaneous,  Once, 17 of 18 cycles Administration: 1,800 mg (04/30/2019), 1,800 mg (05/07/2019), 1,800 mg (05/14/2019), 1,800 mg (05/21/2019), 1,800 mg (05/28/2019), 1,800 mg (06/04/2019), 1,800 mg  (06/11/2019), 1,800 mg (06/18/2019), 1,800 mg (06/25/2019), 1,800 mg (07/09/2019), 1,800 mg (10/15/2019), 1,800 mg (07/23/2019), 1,800 mg (08/06/2019), 1,800 mg (08/20/2019), 1,800 mg (09/03/2019), 1,800 mg (09/17/2019), 1,800 mg (10/01/2019), 1,800 mg (11/12/2019), 1,800 mg (12/10/2019), 1,800 mg (01/14/2020), 1,800 mg (02/18/2020), 1,800 mg (03/17/2020), 1,800 mg (04/21/2020), 1,800 mg (05/19/2020), 1,800 mg (06/16/2020), 1,800 mg (07/14/2020), 1,800 mg (08/12/2020)  for chemotherapy treatment.    04/30/2019 - 07/09/2019 Chemotherapy   The patient had bortezomib SQ (VELCADE) chemo injection 1.75 mg, 1.3 mg/m2 = 1.75 mg, Subcutaneous,  Once, 10 of 11 cycles Administration: 1.75 mg (04/30/2019), 1.75 mg (05/07/2019), 1.75 mg (05/14/2019), 1.75 mg (05/21/2019), 1.75 mg (05/28/2019), 1.75 mg (06/04/2019), 1.75 mg (06/11/2019), 1.75 mg (06/18/2019), 1.75 mg (06/25/2019), 1.75 mg (07/09/2019)  for chemotherapy treatment.      REVIEW OF SYSTEMS:   Constitutional: Denies fevers, chills or abnormal weight loss Eyes: Denies blurriness of vision Ears, nose, mouth, throat, and face: Denies mucositis or sore throat Respiratory: Denies cough, dyspnea or wheezes Cardiovascular: Denies palpitation, chest discomfort or lower extremity swelling Gastrointestinal:  Denies nausea, heartburn or change in bowel habits Skin: Denies abnormal skin rashes Lymphatics: Denies new lymphadenopathy or easy bruising Neurological:Denies numbness, tingling or new weaknesses Behavioral/Psych: Mood is stable, no new changes  All other systems were reviewed with the patient and are negative.  I have reviewed the past medical history, past surgical history, social history and family history with the patient and they are unchanged from previous note.  ALLERGIES:  is allergic to augmentin [amoxicillin-pot clavulanate], albuterol, codeine, ibuprofen, nsaids, and tolmetin.  MEDICATIONS:  Current Outpatient Medications  Medication Sig Dispense Refill  .  acetaminophen (TYLENOL) 500 MG tablet Take 500-1,000 mg by mouth every 6 (six) hours as needed for mild pain, moderate pain, fever or headache.    Marland Kitchen acyclovir (ZOVIRAX) 400 MG tablet TAKE 1 TABLET BY MOUTH TWICE DAILY 180 tablet 3  . aspirin EC 81 MG tablet Take 81 mg by mouth daily with breakfast.    . calcium carbonate (OSCAL) 1500 (600 Ca) MG TABS tablet Take 1,500 mg by mouth daily with breakfast.    . Calcium Carbonate 500 MG CHEW Chew 500 mg by mouth as needed for indigestion.    . Cholecalciferol (VITAMIN D3) 2000 units capsule Take 2,000 Units by mouth daily.     . famotidine (PEPCID) 20 MG tablet Take 20 mg by mouth daily.    . Multiple Vitamins-Minerals (CENTRUM SILVER PO) Take 1 tablet by mouth daily.     Marland Kitchen omeprazole (PRILOSEC) 20 MG capsule Take 1 capsule (20 mg total) by mouth daily. 90 capsule 11  . ondansetron (ZOFRAN) 8 MG tablet TAKE 1 TABLET(8 MG) BY MOUTH EVERY 8 HOURS AS NEEDED FOR NAUSEA OR VOMITING 60 tablet 11  . senna-docusate (SENOKOT-S) 8.6-50 MG tablet Take 1-2 tablets by mouth daily as needed for mild constipation or moderate constipation.     . sodium chloride (OCEAN) 0.65 % SOLN nasal spray Place 1 spray into both nostrils as needed for congestion.    . triamterene-hydrochlorothiazide (MAXZIDE-25) 37.5-25 MG tablet Take 0.5 tablets by mouth daily.    . vitamin B-12 (CYANOCOBALAMIN) 500 MCG tablet Take 500 mcg by mouth daily.     No current facility-administered medications for this visit.   Facility-Administered Medications Ordered in Other Visits  Medication Dose Route Frequency Provider Last Rate Last Admin  . 0.9 %  sodium chloride infusion   Intravenous Once Alvy Bimler, Ni, MD        PHYSICAL EXAMINATION: ECOG PERFORMANCE STATUS: 1 - Symptomatic but completely ambulatory  Vitals:   08/12/20 0808  BP: (!) 133/98  Pulse: 72  Resp: 18  SpO2: 100%   Filed Weights   08/12/20 0808  Weight: 113 lb 6.4 oz (51.4 kg)    GENERAL:alert, no distress and  comfortable SKIN: skin color, texture, turgor are normal, no rashes or significant lesions EYES: normal, Conjunctiva are pink and non-injected, sclera clear OROPHARYNX:no exudate, no erythema and lips, buccal mucosa, and tongue normal  NECK: supple, thyroid normal size, non-tender, without nodularity LYMPH:  no palpable lymphadenopathy in the cervical, axillary or inguinal LUNGS: clear to auscultation and percussion with normal breathing effort HEART: regular rate & rhythm and no murmurs and no lower extremity edema ABDOMEN:abdomen soft, non-tender and normal bowel sounds Musculoskeletal:no cyanosis of digits and no clubbing  NEURO: alert & oriented x 3 with fluent speech, no focal motor/sensory deficits  LABORATORY DATA:  I have reviewed the data as listed    Component Value Date/Time   NA 142 08/12/2020 0740   NA 143 07/14/2017 1245   K 3.1 (L) 08/12/2020 0740   K 3.3 (L) 07/14/2017 1245   CL 110 08/12/2020 0740   CO2 23 08/12/2020 0740   CO2 20 (  L) 07/14/2017 1245   GLUCOSE 81 08/12/2020 0740   GLUCOSE 106 07/14/2017 1245   BUN 9 08/12/2020 0740   BUN 9.3 07/14/2017 1245   CREATININE 0.89 08/12/2020 0740   CREATININE 1.07 (H) 01/14/2020 0828   CREATININE 0.8 07/14/2017 1245   CALCIUM 9.3 08/12/2020 0740   CALCIUM 8.4 07/14/2017 1245   PROT 6.8 08/12/2020 0740   PROT 6.0 07/14/2017 1245   PROT 6.2 (L) 07/14/2017 1245   ALBUMIN 4.0 08/12/2020 0740   ALBUMIN 3.6 07/14/2017 1245   AST 20 08/12/2020 0740   AST 21 01/14/2020 0828   AST 21 07/14/2017 1245   ALT 13 08/12/2020 0740   ALT 15 01/14/2020 0828   ALT 21 07/14/2017 1245   ALKPHOS 62 08/12/2020 0740   ALKPHOS 78 07/14/2017 1245   BILITOT 0.7 08/12/2020 0740   BILITOT 0.8 01/14/2020 0828   BILITOT 0.43 07/14/2017 1245   GFRNONAA >60 08/12/2020 0740   GFRNONAA 53 (L) 01/14/2020 0828   GFRAA 57 (L) 04/21/2020 0817   GFRAA >60 01/14/2020 0828    No results found for: SPEP, UPEP  Lab Results  Component Value  Date   WBC 4.8 08/12/2020   NEUTROABS 2.0 08/12/2020   HGB 11.4 (L) 08/12/2020   HCT 35.3 (L) 08/12/2020   MCV 82.1 08/12/2020   PLT 300 08/12/2020      Chemistry      Component Value Date/Time   NA 142 08/12/2020 0740   NA 143 07/14/2017 1245   K 3.1 (L) 08/12/2020 0740   K 3.3 (L) 07/14/2017 1245   CL 110 08/12/2020 0740   CO2 23 08/12/2020 0740   CO2 20 (L) 07/14/2017 1245   BUN 9 08/12/2020 0740   BUN 9.3 07/14/2017 1245   CREATININE 0.89 08/12/2020 0740   CREATININE 1.07 (H) 01/14/2020 0828   CREATININE 0.8 07/14/2017 1245      Component Value Date/Time   CALCIUM 9.3 08/12/2020 0740   CALCIUM 8.4 07/14/2017 1245   ALKPHOS 62 08/12/2020 0740   ALKPHOS 78 07/14/2017 1245   AST 20 08/12/2020 0740   AST 21 01/14/2020 0828   AST 21 07/14/2017 1245   ALT 13 08/12/2020 0740   ALT 15 01/14/2020 0828   ALT 21 07/14/2017 1245   BILITOT 0.7 08/12/2020 0740   BILITOT 0.8 01/14/2020 0828   BILITOT 0.43 07/14/2017 1245

## 2020-08-13 NOTE — Assessment & Plan Note (Signed)
I have reviewed recent myeloma panel with the patient I have noticed the fluctuation M protein and light chains are slightly trending up She is not symptomatic Her anemia is not related  for now, I recommend we continue on Faspro maintenance treatment We will continue myeloma panel once a month If her M protein start to trend higher than 0.5, we will start myeloma work-up again and consider adding additional therapy She agreed with the plan of care She will continue Zometa every 3 months along with calcium and vitamin D supplement

## 2020-08-14 LAB — MULTIPLE MYELOMA PANEL, SERUM
Albumin SerPl Elph-Mcnc: 3.8 g/dL (ref 2.9–4.4)
Albumin/Glob SerPl: 1.6 (ref 0.7–1.7)
Alpha 1: 0.2 g/dL (ref 0.0–0.4)
Alpha2 Glob SerPl Elph-Mcnc: 0.8 g/dL (ref 0.4–1.0)
B-Globulin SerPl Elph-Mcnc: 1.1 g/dL (ref 0.7–1.3)
Gamma Glob SerPl Elph-Mcnc: 0.5 g/dL (ref 0.4–1.8)
Globulin, Total: 2.5 g/dL (ref 2.2–3.9)
IgA: 7 mg/dL — ABNORMAL LOW (ref 87–352)
IgG (Immunoglobin G), Serum: 562 mg/dL — ABNORMAL LOW (ref 586–1602)
IgM (Immunoglobulin M), Srm: 6 mg/dL — ABNORMAL LOW (ref 26–217)
M Protein SerPl Elph-Mcnc: 0.4 g/dL — ABNORMAL HIGH
Total Protein ELP: 6.3 g/dL (ref 6.0–8.5)

## 2020-09-08 ENCOUNTER — Other Ambulatory Visit: Payer: Self-pay

## 2020-09-08 ENCOUNTER — Inpatient Hospital Stay: Payer: Medicare Other

## 2020-09-08 ENCOUNTER — Telehealth: Payer: Self-pay | Admitting: Hematology and Oncology

## 2020-09-08 ENCOUNTER — Ambulatory Visit: Payer: Medicare Other

## 2020-09-08 ENCOUNTER — Inpatient Hospital Stay: Payer: Medicare Other | Admitting: Hematology and Oncology

## 2020-09-08 ENCOUNTER — Encounter: Payer: Self-pay | Admitting: Hematology and Oncology

## 2020-09-08 DIAGNOSIS — C9001 Multiple myeloma in remission: Secondary | ICD-10-CM

## 2020-09-08 DIAGNOSIS — H5789 Other specified disorders of eye and adnexa: Secondary | ICD-10-CM | POA: Diagnosis not present

## 2020-09-08 DIAGNOSIS — I10 Essential (primary) hypertension: Secondary | ICD-10-CM | POA: Diagnosis not present

## 2020-09-08 DIAGNOSIS — Z7189 Other specified counseling: Secondary | ICD-10-CM

## 2020-09-08 DIAGNOSIS — Z5112 Encounter for antineoplastic immunotherapy: Secondary | ICD-10-CM | POA: Diagnosis not present

## 2020-09-08 LAB — COMPREHENSIVE METABOLIC PANEL
ALT: 11 U/L (ref 0–44)
AST: 19 U/L (ref 15–41)
Albumin: 4.3 g/dL (ref 3.5–5.0)
Alkaline Phosphatase: 67 U/L (ref 38–126)
Anion gap: 9 (ref 5–15)
BUN: 9 mg/dL (ref 8–23)
CO2: 25 mmol/L (ref 22–32)
Calcium: 9.3 mg/dL (ref 8.9–10.3)
Chloride: 109 mmol/L (ref 98–111)
Creatinine, Ser: 0.89 mg/dL (ref 0.44–1.00)
GFR, Estimated: >60 mL/min (ref >60)
Glucose, Bld: 87 mg/dL (ref 70–99)
Potassium: 3.2 mmol/L — ABNORMAL LOW (ref 3.5–5.1)
Sodium: 143 mmol/L (ref 135–145)
Total Bilirubin: 0.7 mg/dL (ref 0.3–1.2)
Total Protein: 7.4 g/dL (ref 6.5–8.1)

## 2020-09-08 LAB — CBC WITH DIFFERENTIAL/PLATELET
Abs Immature Granulocytes: 0.02 10*3/uL (ref 0.00–0.07)
Basophils Absolute: 0.1 10*3/uL (ref 0.0–0.1)
Basophils Relative: 1 %
Eosinophils Absolute: 0.2 10*3/uL (ref 0.0–0.5)
Eosinophils Relative: 4 %
HCT: 38.4 % (ref 36.0–46.0)
Hemoglobin: 12.2 g/dL (ref 12.0–15.0)
Immature Granulocytes: 0 %
Lymphocytes Relative: 42 %
Lymphs Abs: 2.2 10*3/uL (ref 0.7–4.0)
MCH: 26.3 pg (ref 26.0–34.0)
MCHC: 31.8 g/dL (ref 30.0–36.0)
MCV: 82.8 fL (ref 80.0–100.0)
Monocytes Absolute: 0.4 10*3/uL (ref 0.1–1.0)
Monocytes Relative: 8 %
Neutro Abs: 2.3 10*3/uL (ref 1.7–7.7)
Neutrophils Relative %: 45 %
Platelets: 335 10*3/uL (ref 150–400)
RBC: 4.64 MIL/uL (ref 3.87–5.11)
RDW: 14 % (ref 11.5–15.5)
WBC: 5.1 10*3/uL (ref 4.0–10.5)
nRBC: 0 % (ref 0.0–0.2)

## 2020-09-08 MED ORDER — ACETAMINOPHEN 325 MG PO TABS
ORAL_TABLET | ORAL | Status: AC
  Filled 2020-09-08: qty 2

## 2020-09-08 MED ORDER — MONTELUKAST SODIUM 10 MG PO TABS
10.0000 mg | ORAL_TABLET | Freq: Once | ORAL | Status: AC
  Administered 2020-09-08: 10 mg via ORAL

## 2020-09-08 MED ORDER — MONTELUKAST SODIUM 10 MG PO TABS
ORAL_TABLET | ORAL | Status: AC
  Filled 2020-09-08: qty 1

## 2020-09-08 MED ORDER — ACETAMINOPHEN 325 MG PO TABS
650.0000 mg | ORAL_TABLET | Freq: Once | ORAL | Status: AC
  Administered 2020-09-08: 650 mg via ORAL

## 2020-09-08 MED ORDER — DARATUMUMAB-HYALURONIDASE-FIHJ 1800-30000 MG-UT/15ML ~~LOC~~ SOLN
1800.0000 mg | Freq: Once | SUBCUTANEOUS | Status: AC
  Administered 2020-09-08: 1800 mg via SUBCUTANEOUS
  Filled 2020-09-08: qty 15

## 2020-09-08 MED ORDER — DEXAMETHASONE 4 MG PO TABS
ORAL_TABLET | ORAL | Status: AC
  Filled 2020-09-08: qty 2

## 2020-09-08 MED ORDER — DEXAMETHASONE 4 MG PO TABS
8.0000 mg | ORAL_TABLET | Freq: Once | ORAL | Status: AC
  Administered 2020-09-08: 8 mg via ORAL

## 2020-09-08 NOTE — Progress Notes (Signed)
Per Dr. Alvy Bimler - will hold zometa until next month.

## 2020-09-08 NOTE — Patient Instructions (Signed)
[facility] Discharge Instructions for Patients Receiving Chemotherapy  Today you received the following chemotherapy agents: Darzalex Faspro  To help prevent nausea and vomiting after your treatment, we encourage you to take your nausea medication  as prescribed.    If you develop nausea and vomiting that is not controlled by your nausea medication, call the clinic.   BELOW ARE SYMPTOMS THAT SHOULD BE REPORTED IMMEDIATELY:  *FEVER GREATER THAN 100.5 F  *CHILLS WITH OR WITHOUT FEVER  NAUSEA AND VOMITING THAT IS NOT CONTROLLED WITH YOUR NAUSEA MEDICATION  *UNUSUAL SHORTNESS OF BREATH  *UNUSUAL BRUISING OR BLEEDING  TENDERNESS IN MOUTH AND THROAT WITH OR WITHOUT PRESENCE OF ULCERS  *URINARY PROBLEMS  *BOWEL PROBLEMS  UNUSUAL RASH Items with * indicate a potential emergency and should be followed up as soon as possible.  Feel free to call the clinic should you have any questions or concerns. The clinic phone number is [phone].  Please show the CHEMO ALERT CARD at check-in to the Emergency Department and triage nurse.   

## 2020-09-08 NOTE — Assessment & Plan Note (Signed)
Her blood pressure is very high, component of anxiety She will continue her current prescribed blood pressure medication

## 2020-09-08 NOTE — Telephone Encounter (Signed)
Scheduled appointments per 1/31 sch msg. Spoke to patient who is aware of appointments date and times. Gave patient calendar print out.

## 2020-09-08 NOTE — Assessment & Plan Note (Signed)
I have reviewed recent myeloma panel with the patient and her husband I have noticed the fluctuation M protein and light chains are slightly trending up She is not symptomatic for now, I recommend we continue on Faspro maintenance treatment We will continue myeloma panel once a month If her M protein start to trend higher than 0.5, we will start myeloma work-up again and consider adding additional therapy She agreed with the plan of care She will continue Zometa every 3 months along with calcium and vitamin D supplement, due next visit I will call them with test results at the end of the week and if her M protein is higher, we can either set up a virtual meeting or meet together again to discuss treatment options

## 2020-09-08 NOTE — Assessment & Plan Note (Signed)
I do not believe this is related to her treatment I recommend follow-up with ophthalmologist

## 2020-09-08 NOTE — Progress Notes (Signed)
[place] OFFICE PROGRESS NOTE  Patient Care Team: Josetta Huddle, MD as PCP - General (Internal Medicine) Ginette Pitman, MD as Consulting Physician (Hematology and Oncology) Hessie Dibble, MD as Consulting Physician (Hematology and Oncology)  ASSESSMENT & PLAN:  Multiple myeloma in remission [facility]) I have reviewed recent myeloma panel with the patient and her husband I have noticed the fluctuation M protein and light chains are slightly trending up She is not symptomatic for now, I recommend we continue on Faspro maintenance treatment We will continue myeloma panel once a month If her M protein start to trend higher than 0.5, we will start myeloma work-up again and consider adding additional therapy She agreed with the plan of care She will continue Zometa every 3 months along with calcium and vitamin D supplement, due next visit I will call them with test results at the end of the week and if her M protein is higher, we can either set up a virtual meeting or meet together again to discuss treatment options  Irritation of right eye I do not believe this is related to her treatment I recommend follow-up with ophthalmologist  Essential hypertension Her blood pressure is very high, component of anxiety She will continue her current prescribed blood pressure medication   No orders of the defined types were placed in this encounter.   All questions were answered. The patient knows to call the clinic with any problems, questions or concerns. The total time spent in the appointment was 20 minutes encounter with patients including review of chart and various tests results, discussions about plan of care and coordination of care plan   [name], MD 09/08/2020 10:15 AM  INTERVAL HISTORY: Please see below for problem oriented charting. She returns with her husband for further follow-up She tolerated treatment recently well without side effects She has no dental  issues She has some eye irritation on the right side with occasional blurriness of vision, have not seen her ophthalmologist yet Both the patient and her husband has questions regarding her myeloma panel  SUMMARY OF ONCOLOGIC HISTORY: Oncology History Overview Note   M-protein 0.69 gm/dl IFIX - IgG, Kappa IgG - 868 IgA - 19 IgM - < 20 Kappa - 21 Lambda - 5.7  09/06/2014 - Bone marrow aspirate and biopsy:   Normocellular marrow for age (40%) with a small monoclonal plasma cell population (1% on aspirate). Karyotype 42, XX  FISH Negative for myeloma associated changes  09/12/2014 - PET/CT  Two regions that are concerning for disease, one adjacent/involving the left ninth rib and one in the marrow of the right femur, in this patient with history of plasmacytoma.    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   04/01/2015 Bone Marrow Transplant   She received melphalan chemotherapy on 03/31/2015 followed by autologous stem cell transplant the day  after   04/03/2015 - 04/18/2015 Hospital Admission   The patient was admitted to the hospital at [facility] for management related to complication from stem cell transplant. She had significant nausea requiring intravenous anti-emetics.   07/17/2015 -  Chemotherapy   She started maintenance Revlimid and monthly zometa, then every 3 months   06/01/2018 Imaging   DEXA scan showed bone density T score in femur -2.3   04/20/2019 PET scan   1. No FDG avid osseous lesions or mass identified to suggest metabolically active lesion of myeloma or plasmacytoma. 2. Small nodular density within the paravertebral right lower lobe exhibits mild to moderate increased uptake within SUV max of 3.38. This is indeterminate. Review of CT chest from 10/05/2018 shows a corresponding Lung nodule in this area measuring the same. Small pulmonary neoplasm cannot be excluded. 3. Indeterminate, focal area of increased uptake is identified within the thoracic canal. Indeterminate favored to represent benign physiologic CNS activity.     04/30/2019 -  Chemotherapy      Patient is on Antibody Plan: MYELOMA SQ DARATUMUMAB FASPRO Q28D    04/30/2019 - 07/09/2019 Chemotherapy   The patient had bortezomib SQ (VELCADE) chemo injection 1.75 mg, 1.3 mg/m2 = 1.75 mg, Subcutaneous,  Once, 10 of 11 cycles Administration: 1.75 mg (04/30/2019), 1.75 mg (05/07/2019), 1.75 mg (05/14/2019), 1.75 mg (05/21/2019), 1.75 mg (05/28/2019), 1.75 mg (06/04/2019), 1.75 mg (06/11/2019), 1.75 mg (06/18/2019), 1.75 mg (06/25/2019), 1.75 mg (07/09/2019)  for chemotherapy treatment.      REVIEW OF SYSTEMS:   Constitutional: Denies fevers, chills or abnormal weight loss Eyes: Denies blurriness of vision Ears, nose, mouth, throat, and face: Denies mucositis or sore throat Respiratory: Denies cough, dyspnea or wheezes Cardiovascular: Denies palpitation, chest discomfort or lower extremity swelling Gastrointestinal:  Denies nausea, heartburn or change in  bowel habits Skin: Denies abnormal skin rashes Lymphatics: Denies new lymphadenopathy or easy bruising Neurological:Denies numbness, tingling or new weaknesses Behavioral/Psych: Mood is stable, no new changes  All other systems were reviewed with the patient and are negative.  I have reviewed the past medical history, past surgical history, social history and family history with the patient and they are unchanged from previous note.  ALLERGIES:  is allergic to augmentin [amoxicillin-pot clavulanate], albuterol, codeine, ibuprofen, nsaids, and tolmetin.  MEDICATIONS:  Current Outpatient Medications  Medication Sig Dispense Refill  . acetaminophen (TYLENOL) 500 MG tablet Take 500-1,000 mg by mouth every 6 (six) hours as needed for mild pain, moderate pain, fever or headache.    Marland Kitchen acyclovir (ZOVIRAX) 400 MG tablet TAKE 1 TABLET BY MOUTH TWICE DAILY 180 tablet 3  . aspirin EC 81 MG tablet Take 81 mg by mouth daily with breakfast.    . calcium carbonate (OSCAL) 1500 (600 Ca) MG TABS tablet Take 1,500 mg by mouth daily with breakfast.    . Calcium Carbonate 500 MG CHEW Chew 500 mg by mouth as needed for indigestion.    . Cholecalciferol (VITAMIN D3) 2000 units capsule Take 2,000 Units by mouth daily.     . famotidine (PEPCID) 20 MG tablet Take 20 mg by mouth  daily.    . Multiple Vitamins-Minerals (CENTRUM SILVER PO) Take 1 tablet by mouth daily.     Marland Kitchen omeprazole (PRILOSEC) 20 MG capsule Take 1 capsule (20 mg total) by mouth daily. 90 capsule 11  . ondansetron (ZOFRAN) 8 MG tablet TAKE 1 TABLET(8 MG) BY MOUTH EVERY 8 HOURS AS NEEDED FOR NAUSEA OR VOMITING 60 tablet 11  . senna-docusate (SENOKOT-S) 8.6-50 MG tablet Take 1-2 tablets by mouth daily as needed for mild constipation or moderate constipation.     . sodium chloride (OCEAN) 0.65 % SOLN nasal spray Place 1 spray into both nostrils as needed for congestion.    . triamterene-hydrochlorothiazide (MAXZIDE-25) 37.5-25 MG tablet Take 0.5 tablets  by mouth daily.    . vitamin B-12 (CYANOCOBALAMIN) 500 MCG tablet Take 500 mcg by mouth daily.     No current facility-administered medications for this visit.   Facility-Administered Medications Ordered in Other Visits  Medication Dose Route Frequency Provider Last Rate Last Admin  . 0.9 %  sodium chloride infusion   Intravenous Once Alvy Bimler, Jakyren Fluegge, MD      . daratumumab-hyaluronidase-fihj (DARZALEX FASPRO) 1800-30000 MG-UT/15ML chemo SQ injection 1,800 mg  1,800 mg Subcutaneous Once Alvy Bimler, Jernard Reiber, MD        PHYSICAL EXAMINATION: ECOG PERFORMANCE STATUS: 1 - Symptomatic but completely ambulatory  Vitals:   09/08/20 0806  BP: (!) 163/95  Pulse: 84  Resp: 18  Temp: 97.7 F (36.5 C)  SpO2: 100%   Filed Weights   09/08/20 0806  Weight: 111 lb 6.4 oz (50.5 kg)    GENERAL:alert, no distress and comfortable EYES: normal, Conjunctiva are pink and non-injected, sclera clear NEURO: alert & oriented x 3 with fluent speech, no focal motor/sensory deficits  LABORATORY DATA:  I have reviewed the data as listed    Component Value Date/Time   NA 143 09/08/2020 0745   NA 143 07/14/2017 1245   K 3.2 (L) 09/08/2020 0745   K 3.3 (L) 07/14/2017 1245   CL 109 09/08/2020 0745   CO2 25 09/08/2020 0745   CO2 20 (L) 07/14/2017 1245   GLUCOSE 87 09/08/2020 0745   GLUCOSE 106 07/14/2017 1245   BUN 9 09/08/2020 0745   BUN 9.3 07/14/2017 1245   CREATININE 0.89 09/08/2020 0745   CREATININE 1.07 (H) 01/14/2020 0828   CREATININE 0.8 07/14/2017 1245   CALCIUM 9.3 09/08/2020 0745   CALCIUM 8.4 07/14/2017 1245   PROT 7.4 09/08/2020 0745   PROT 6.0 07/14/2017 1245   PROT 6.2 (L) 07/14/2017 1245   ALBUMIN 4.3 09/08/2020 0745   ALBUMIN 3.6 07/14/2017 1245   AST 19 09/08/2020 0745   AST 21 01/14/2020 0828   AST 21 07/14/2017 1245   ALT 11 09/08/2020 0745   ALT 15 01/14/2020 0828   ALT 21 07/14/2017 1245   ALKPHOS 67 09/08/2020 0745   ALKPHOS 78 07/14/2017 1245   BILITOT 0.7 09/08/2020 0745    BILITOT 0.8 01/14/2020 0828   BILITOT 0.43 07/14/2017 1245   GFRNONAA >60 09/08/2020 0745   GFRNONAA 53 (L) 01/14/2020 0828   GFRAA 57 (L) 04/21/2020 0817   GFRAA >60 01/14/2020 0828    No results found for: SPEP, UPEP  Lab Results  Component Value Date   WBC 5.1 09/08/2020   NEUTROABS 2.3 09/08/2020   HGB 12.2 09/08/2020   HCT 38.4 09/08/2020   MCV 82.8 09/08/2020   PLT 335 09/08/2020      Chemistry      Component Value Date/Time   NA  143 09/08/2020 0745   NA 143 07/14/2017 1245   K 3.2 (L) 09/08/2020 0745   K 3.3 (L) 07/14/2017 1245   CL 109 09/08/2020 0745   CO2 25 09/08/2020 0745   CO2 20 (L) 07/14/2017 1245   BUN 9 09/08/2020 0745   BUN 9.3 07/14/2017 1245   CREATININE 0.89 09/08/2020 0745   CREATININE 1.07 (H) 01/14/2020 0828   CREATININE 0.8 07/14/2017 1245      Component Value Date/Time   CALCIUM 9.3 09/08/2020 0745   CALCIUM 8.4 07/14/2017 1245   ALKPHOS 67 09/08/2020 0745   ALKPHOS 78 07/14/2017 1245   AST 19 09/08/2020 0745   AST 21 01/14/2020 0828   AST 21 07/14/2017 1245   ALT 11 09/08/2020 0745   ALT 15 01/14/2020 0828   ALT 21 07/14/2017 1245   BILITOT 0.7 09/08/2020 0745   BILITOT 0.8 01/14/2020 0828   BILITOT 0.43 07/14/2017 1245

## 2020-09-09 LAB — KAPPA/LAMBDA LIGHT CHAINS
Kappa free light chain: 20.4 mg/L — ABNORMAL HIGH (ref 3.3–19.4)
Kappa, lambda light chain ratio: 9.27 — ABNORMAL HIGH (ref 0.26–1.65)
Lambda free light chains: 2.2 mg/L — ABNORMAL LOW (ref 5.7–26.3)

## 2020-09-10 ENCOUNTER — Telehealth: Payer: Self-pay

## 2020-09-10 LAB — MULTIPLE MYELOMA PANEL, SERUM
Albumin SerPl Elph-Mcnc: 3.9 g/dL (ref 2.9–4.4)
Albumin/Glob SerPl: 1.4 (ref 0.7–1.7)
Alpha 1: 0.3 g/dL (ref 0.0–0.4)
Alpha2 Glob SerPl Elph-Mcnc: 0.8 g/dL (ref 0.4–1.0)
B-Globulin SerPl Elph-Mcnc: 1.1 g/dL (ref 0.7–1.3)
Gamma Glob SerPl Elph-Mcnc: 0.6 g/dL (ref 0.4–1.8)
Globulin, Total: 2.8 g/dL (ref 2.2–3.9)
IgA: 7 mg/dL — ABNORMAL LOW (ref 87–352)
IgG (Immunoglobin G), Serum: 646 mg/dL (ref 586–1602)
IgM (Immunoglobulin M), Srm: 11 mg/dL — ABNORMAL LOW (ref 26–217)
M Protein SerPl Elph-Mcnc: 0.4 g/dL — ABNORMAL HIGH
Total Protein ELP: 6.7 g/dL (ref 6.0–8.5)

## 2020-09-10 NOTE — Telephone Encounter (Signed)
Called and given below message. She verbalized understanding. 

## 2020-09-10 NOTE — Telephone Encounter (Signed)
-----   Message from [name], MD sent at 09/10/2020 11:42 AM EST ----- Regarding: myeloma panel Pls call and let her know M protein is stable I will see her next month as scheduled, no change in Rx

## 2020-10-06 ENCOUNTER — Other Ambulatory Visit: Payer: Self-pay

## 2020-10-06 ENCOUNTER — Inpatient Hospital Stay (HOSPITAL_BASED_OUTPATIENT_CLINIC_OR_DEPARTMENT_OTHER): Payer: Medicare Other | Admitting: Hematology and Oncology

## 2020-10-06 ENCOUNTER — Telehealth: Payer: Self-pay | Admitting: Hematology and Oncology

## 2020-10-06 ENCOUNTER — Inpatient Hospital Stay: Payer: Medicare Other

## 2020-10-06 ENCOUNTER — Inpatient Hospital Stay: Payer: Medicare Other | Attending: Hematology and Oncology

## 2020-10-06 ENCOUNTER — Encounter: Payer: Self-pay | Admitting: Hematology and Oncology

## 2020-10-06 VITALS — BP 133/98 | HR 72

## 2020-10-06 DIAGNOSIS — Z5112 Encounter for antineoplastic immunotherapy: Secondary | ICD-10-CM | POA: Diagnosis present

## 2020-10-06 DIAGNOSIS — Z881 Allergy status to other antibiotic agents status: Secondary | ICD-10-CM | POA: Diagnosis not present

## 2020-10-06 DIAGNOSIS — J984 Other disorders of lung: Secondary | ICD-10-CM | POA: Insufficient documentation

## 2020-10-06 DIAGNOSIS — Z885 Allergy status to narcotic agent status: Secondary | ICD-10-CM | POA: Insufficient documentation

## 2020-10-06 DIAGNOSIS — N183 Chronic kidney disease, stage 3 unspecified: Secondary | ICD-10-CM

## 2020-10-06 DIAGNOSIS — Z886 Allergy status to analgesic agent status: Secondary | ICD-10-CM | POA: Insufficient documentation

## 2020-10-06 DIAGNOSIS — Z9484 Stem cells transplant status: Secondary | ICD-10-CM | POA: Diagnosis not present

## 2020-10-06 DIAGNOSIS — C9001 Multiple myeloma in remission: Secondary | ICD-10-CM | POA: Insufficient documentation

## 2020-10-06 DIAGNOSIS — Z79899 Other long term (current) drug therapy: Secondary | ICD-10-CM | POA: Insufficient documentation

## 2020-10-06 DIAGNOSIS — Z9481 Bone marrow transplant status: Secondary | ICD-10-CM

## 2020-10-06 DIAGNOSIS — R531 Weakness: Secondary | ICD-10-CM | POA: Insufficient documentation

## 2020-10-06 DIAGNOSIS — Z880 Allergy status to penicillin: Secondary | ICD-10-CM | POA: Insufficient documentation

## 2020-10-06 DIAGNOSIS — R5381 Other malaise: Secondary | ICD-10-CM

## 2020-10-06 DIAGNOSIS — Z9221 Personal history of antineoplastic chemotherapy: Secondary | ICD-10-CM | POA: Insufficient documentation

## 2020-10-06 DIAGNOSIS — Z923 Personal history of irradiation: Secondary | ICD-10-CM | POA: Insufficient documentation

## 2020-10-06 DIAGNOSIS — Z7189 Other specified counseling: Secondary | ICD-10-CM

## 2020-10-06 LAB — COMPREHENSIVE METABOLIC PANEL
ALT: 10 U/L (ref 0–44)
AST: 20 U/L (ref 15–41)
Albumin: 4.3 g/dL (ref 3.5–5.0)
Alkaline Phosphatase: 56 U/L (ref 38–126)
Anion gap: 6 (ref 5–15)
BUN: 13 mg/dL (ref 8–23)
CO2: 24 mmol/L (ref 22–32)
Calcium: 9.8 mg/dL (ref 8.9–10.3)
Chloride: 109 mmol/L (ref 98–111)
Creatinine, Ser: 1.01 mg/dL — ABNORMAL HIGH (ref 0.44–1.00)
GFR, Estimated: 60 mL/min — ABNORMAL LOW (ref >60)
Glucose, Bld: 85 mg/dL (ref 70–99)
Potassium: 3.5 mmol/L (ref 3.5–5.1)
Sodium: 139 mmol/L (ref 135–145)
Total Bilirubin: 0.5 mg/dL (ref 0.3–1.2)
Total Protein: 7.3 g/dL (ref 6.5–8.1)

## 2020-10-06 LAB — CBC WITH DIFFERENTIAL/PLATELET
Abs Immature Granulocytes: 0.02 10*3/uL (ref 0.00–0.07)
Basophils Absolute: 0.1 10*3/uL (ref 0.0–0.1)
Basophils Relative: 1 %
Eosinophils Absolute: 0.2 10*3/uL (ref 0.0–0.5)
Eosinophils Relative: 4 %
HCT: 37.7 % (ref 36.0–46.0)
Hemoglobin: 12 g/dL (ref 12.0–15.0)
Immature Granulocytes: 0 %
Lymphocytes Relative: 45 %
Lymphs Abs: 2.4 10*3/uL (ref 0.7–4.0)
MCH: 26.2 pg (ref 26.0–34.0)
MCHC: 31.8 g/dL (ref 30.0–36.0)
MCV: 82.3 fL (ref 80.0–100.0)
Monocytes Absolute: 0.5 10*3/uL (ref 0.1–1.0)
Monocytes Relative: 10 %
Neutro Abs: 2.1 10*3/uL (ref 1.7–7.7)
Neutrophils Relative %: 40 %
Platelets: 324 10*3/uL (ref 150–400)
RBC: 4.58 MIL/uL (ref 3.87–5.11)
RDW: 14.3 % (ref 11.5–15.5)
WBC: 5.3 10*3/uL (ref 4.0–10.5)
nRBC: 0 % (ref 0.0–0.2)

## 2020-10-06 MED ORDER — SODIUM CHLORIDE 0.9 % IV SOLN
Freq: Once | INTRAVENOUS | Status: AC
  Filled 2020-10-06: qty 250

## 2020-10-06 MED ORDER — ACETAMINOPHEN 325 MG PO TABS
650.0000 mg | ORAL_TABLET | Freq: Once | ORAL | Status: AC
  Administered 2020-10-06: 650 mg via ORAL

## 2020-10-06 MED ORDER — MONTELUKAST SODIUM 10 MG PO TABS
10.0000 mg | ORAL_TABLET | Freq: Once | ORAL | Status: AC
  Administered 2020-10-06: 10 mg via ORAL

## 2020-10-06 MED ORDER — ZOLEDRONIC ACID 4 MG/5ML IV CONC
3.0000 mg | Freq: Once | INTRAVENOUS | Status: AC
  Administered 2020-10-06: 3 mg via INTRAVENOUS
  Filled 2020-10-06: qty 3.75

## 2020-10-06 MED ORDER — MONTELUKAST SODIUM 10 MG PO TABS
ORAL_TABLET | ORAL | Status: AC
  Filled 2020-10-06: qty 1

## 2020-10-06 MED ORDER — ZOLEDRONIC ACID 4 MG/100ML IV SOLN
INTRAVENOUS | Status: AC
  Filled 2020-10-06: qty 100

## 2020-10-06 MED ORDER — DEXAMETHASONE 4 MG PO TABS
8.0000 mg | ORAL_TABLET | Freq: Once | ORAL | Status: AC
  Administered 2020-10-06: 8 mg via ORAL

## 2020-10-06 MED ORDER — ACETAMINOPHEN 325 MG PO TABS
ORAL_TABLET | ORAL | Status: AC
  Filled 2020-10-06: qty 2

## 2020-10-06 MED ORDER — DEXAMETHASONE 4 MG PO TABS
ORAL_TABLET | ORAL | Status: AC
  Filled 2020-10-06: qty 2

## 2020-10-06 MED ORDER — DARATUMUMAB-HYALURONIDASE-FIHJ 1800-30000 MG-UT/15ML ~~LOC~~ SOLN
1800.0000 mg | Freq: Once | SUBCUTANEOUS | Status: AC
  Administered 2020-10-06: 1800 mg via SUBCUTANEOUS
  Filled 2020-10-06: qty 15

## 2020-10-06 NOTE — Assessment & Plan Note (Signed)
She is weak and have difficulties negotiating walking short distances without stopping I spent some time completing disability parking permit application for her

## 2020-10-06 NOTE — Telephone Encounter (Signed)
Scheduled appts per 2/28 sch msg. Gave pt a printout of AVS.

## 2020-10-06 NOTE — Assessment & Plan Note (Signed)
She does not need dose adjustment for Faspro I will prescribe slight dose adjustment for Zometa

## 2020-10-06 NOTE — Patient Instructions (Addendum)
[facility] Discharge Instructions for Patients Receiving Chemotherapy  Today you received the following chemotherapy agent: Daratumumab (Darzalex Faspro)  To help prevent nausea and vomiting after your treatment, we encourage you to take your nausea medication as directed by your MD.   If you develop nausea and vomiting that is not controlled by your nausea medication, call the clinic.   BELOW ARE SYMPTOMS THAT SHOULD BE REPORTED IMMEDIATELY:  *FEVER GREATER THAN 100.5 F  *CHILLS WITH OR WITHOUT FEVER  NAUSEA AND VOMITING THAT IS NOT CONTROLLED WITH YOUR NAUSEA MEDICATION  *UNUSUAL SHORTNESS OF BREATH  *UNUSUAL BRUISING OR BLEEDING  TENDERNESS IN MOUTH AND THROAT WITH OR WITHOUT PRESENCE OF ULCERS  *URINARY PROBLEMS  *BOWEL PROBLEMS  UNUSUAL RASH Items with * indicate a potential emergency and should be followed up as soon as possible.  Feel free to call the clinic should you have any questions or concerns. The clinic phone number is [phone].  Please show the [facility] at check-in to the Emergency Department and triage nurse.  Zoledronic Acid Injection (Hypercalcemia, Oncology) What is this medicine? ZOLEDRONIC ACID (ZOE le dron ik AS id) slows calcium loss from bones. It high calcium levels in the blood from some kinds of cancer. It may be used in other people at risk for bone loss. This medicine may be used for other purposes; ask your health care provider or pharmacist if you have questions. COMMON BRAND NAME(S): Zometa What should I tell my health care provider before I take this medicine? They need to know if you have any of these conditions:  cancer  dehydration  dental disease  kidney disease  liver disease  low levels of calcium in the blood  lung or breathing disease (asthma)  receiving steroids like dexamethasone or prednisone  an unusual or allergic reaction to zoledronic acid, other medicines, foods, dyes, or  preservatives  pregnant or trying to get pregnant  breast-feeding How should I use this medicine? This drug is injected into a vein. It is given by a health care provider in a hospital or clinic setting. Talk to your health care provider about the use of this drug in children. Special care may be needed. Overdosage: If you think you have taken too much of this medicine contact a poison control center or emergency room at once. NOTE: This medicine is only for you. Do not share this medicine with others. What if I miss a dose? Keep appointments for follow-up doses. It is important not to miss your dose. Call your health care provider if you are unable to keep an appointment. What may interact with this medicine?  certain antibiotics given by injection  NSAIDs, medicines for pain and inflammation, like ibuprofen or naproxen  some diuretics like bumetanide, furosemide  teriparatide  thalidomide This list may not describe all possible interactions. Give your health care provider a list of all the medicines, herbs, non-prescription drugs, or dietary supplements you use. Also tell them if you smoke, drink alcohol, or use illegal drugs. Some items may interact with your medicine. What should I watch for while using this medicine? Visit your health care provider for regular checks on your progress. It may be some time before you see the benefit from this drug. Some people who take this drug have severe bone, joint, or muscle pain. This drug may also increase your risk for jaw problems or a broken thigh bone. Tell your health care provider right away if you have severe pain  in your jaw, bones, joints, or muscles. Tell you health care provider if you have any pain that does not go away or that gets worse. Tell your dentist and dental surgeon that you are taking this drug. You should not have major dental surgery while on this drug. See your dentist to have a dental exam and fix any dental problems  before starting this drug. Take good care of your teeth while on this drug. Make sure you see your dentist for regular follow-up appointments. You should make sure you get enough calcium and vitamin D while you are taking this drug. Discuss the foods you eat and the vitamins you take with your health care provider. Check with your health care provider if you have severe diarrhea, nausea, and vomiting, or if you sweat a lot. The loss of too much body fluid may make it dangerous for you to take this drug. You may need blood work done while you are taking this drug. Do not become pregnant while taking this drug. Women should inform their health care provider if they wish to become pregnant or think they might be pregnant. There is potential for serious harm to an unborn child. Talk to your health care provider for more information. What side effects may I notice from receiving this medicine? Side effects that you should report to your doctor or health care provider as soon as possible:  allergic reactions (skin rash, itching or hives; swelling of the face, lips, or tongue)  bone pain  infection (fever, chills, cough, sore throat, pain or trouble passing urine)  jaw pain, especially after dental work  joint pain  kidney injury (trouble passing urine or change in the amount of urine)  low blood pressure (dizziness; feeling faint or lightheaded, falls; unusually weak or tired)  low calcium levels (fast heartbeat; muscle cramps or pain; pain, tingling, or numbness in the hands or feet; seizures)  low magnesium levels (fast, irregular heartbeat; muscle cramp or pain; muscle weakness; tremors; seizures)  low red blood cell counts (trouble breathing; feeling faint; lightheaded, falls; unusually weak or tired)  muscle pain  redness, blistering, peeling, or loosening of the skin, including inside the mouth  severe diarrhea  swelling of the ankles, feet, hands  trouble breathing Side effects  that usually do not require medical attention (report to your doctor or health care provider if they continue or are bothersome):  anxious  constipation  coughing  depressed mood  eye irritation, itching, or pain  fever  general ill feeling or flu-like symptoms  nausea  pain, redness, or irritation at site where injected  trouble sleeping This list may not describe all possible side effects. Call your doctor for medical advice about side effects. You may report side effects to FDA at 1-800-FDA-1088. Where should I keep my medicine? This drug is given in a hospital or clinic. It will not be stored at home. NOTE: This sheet is a summary. It may not cover all possible information. If you have questions about this medicine, talk to your doctor, pharmacist, or health care provider.  2021 [employer]/Gold Standard (2019-05-10 09:13:00)

## 2020-10-06 NOTE — Progress Notes (Signed)
[place] OFFICE PROGRESS NOTE  Patient Care Team: Josetta Huddle, MD as PCP - General (Internal Medicine) Ginette Pitman, MD as Consulting Physician (Hematology and Oncology) Hessie Dibble, MD as Consulting Physician (Hematology and Oncology)  ASSESSMENT & PLAN:  Multiple myeloma in remission [facility]) I have reviewed recent myeloma panel with the patient and her husband I have noticed the fluctuation M protein and light chains are slightly trending up She is not symptomatic for now, I recommend we continue on Faspro maintenance treatment We will continue myeloma panel once a month If her M protein start to trend higher than 0.5, we will start myeloma work-up again and consider adding additional therapy She agreed with the plan of care She will continue Zometa every 3 months along with calcium and vitamin D supplement, due today I will call them with test results at the end of the week and if her M protein is higher, we can either set up a virtual meeting or meet together again to discuss treatment options  Chronic kidney disease, stage III (moderate) She does not need dose adjustment for Faspro I will prescribe slight dose adjustment for Zometa  Physical debility She is weak and have difficulties negotiating walking short distances without stopping I spent some time completing disability parking permit application for her   No orders of the defined types were placed in this encounter.   All questions were answered. The patient knows to call the clinic with any problems, questions or concerns. The total time spent in the appointment was 20 minutes encounter with patients including review of chart and various tests results, discussions about plan of care and coordination of care plan   [name], MD 10/06/2020 9:40 AM  INTERVAL HISTORY: Please see below for problem oriented charting. She returns for further treatment and follow-up She denies recent infection,  fever or chills No new bone pain She saw her ophthalmologist and was prescribed 2 different medications including eyedrops and an ointment to put on her eyelid No recent dental issues  SUMMARY OF ONCOLOGIC HISTORY: Oncology History Overview Note   M-protein 0.69 gm/dl IFIX - IgG, Kappa IgG - 868 IgA - 19 IgM - < 20 Kappa - 21 Lambda - 5.7  09/06/2014 - Bone marrow aspirate and biopsy:   Normocellular marrow for age (40%) with a small monoclonal plasma cell population (1% on aspirate). Karyotype 64, XX  FISH Negative for myeloma associated changes  09/12/2014 - PET/CT  Two regions that are concerning for disease, one adjacent/involving the left ninth rib and one in the marrow of the right femur, in this patient with history of plasmacytoma.    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   04/01/2015 Bone Marrow Transplant   She received melphalan chemotherapy on 03/31/2015 followed by autologous stem cell transplant the day after   04/03/2015 -  04/18/2015 Hospital Admission   The patient was admitted to the hospital at [place] for management related to complication from stem cell transplant. She had significant nausea requiring intravenous anti-emetics.   07/17/2015 -  Chemotherapy   She started maintenance Revlimid and monthly zometa, then every 3 months   06/01/2018 Imaging   DEXA scan showed bone density T score in femur -2.3   04/20/2019 PET scan   1. No FDG avid osseous lesions or mass identified to suggest metabolically active lesion of myeloma or plasmacytoma. 2. Small nodular density within the paravertebral right lower lobe exhibits mild to moderate increased uptake within SUV max of 3.38. This is indeterminate. Review of CT chest from 10/05/2018 shows a corresponding Lung nodule in this area measuring the same. Small pulmonary neoplasm cannot be excluded. 3. Indeterminate, focal area of increased uptake is identified within the thoracic canal. Indeterminate favored to represent benign physiologic CNS activity.     04/30/2019 -  Chemotherapy      Patient is on Antibody Plan: MYELOMA SQ DARATUMUMAB FASPRO Q28D    04/30/2019 - 07/09/2019 Chemotherapy   The patient had bortezomib SQ (VELCADE) chemo injection 1.75 mg, 1.3 mg/m2 = 1.75 mg, Subcutaneous,  Once, 10 of 11 cycles Administration: 1.75 mg (04/30/2019), 1.75 mg (05/07/2019), 1.75 mg (05/14/2019), 1.75 mg (05/21/2019), 1.75 mg (05/28/2019), 1.75 mg (06/04/2019), 1.75 mg (06/11/2019), 1.75 mg (06/18/2019), 1.75 mg (06/25/2019), 1.75 mg (07/09/2019)  for chemotherapy treatment.      REVIEW OF SYSTEMS:   Constitutional: Denies fevers, chills or abnormal weight loss Eyes: Denies blurriness of vision Ears, nose, mouth, throat, and face: Denies mucositis or sore throat Respiratory: Denies cough, dyspnea or wheezes Cardiovascular: Denies palpitation, chest discomfort or lower extremity swelling Gastrointestinal:  Denies nausea, heartburn or change in bowel habits Skin:  Denies abnormal skin rashes Lymphatics: Denies new lymphadenopathy or easy bruising Neurological:Denies numbness, tingling or new weaknesses Behavioral/Psych: Mood is stable, no new changes  All other systems were reviewed with the patient and are negative.  I have reviewed the past medical history, past surgical history, social history and family history with the patient and they are unchanged from previous note.  ALLERGIES:  is allergic to augmentin [amoxicillin-pot clavulanate], albuterol, codeine, ibuprofen, nsaids, and tolmetin.  MEDICATIONS:  Current Outpatient Medications  Medication Sig Dispense Refill  . fluorometholone (FML) 0.1 % ophthalmic suspension Place 1 drop into both eyes 3 (three) times daily.    Marland Kitchen triamcinolone ointment (KENALOG) 0.1 % Apply 1 application topically daily.    Marland Kitchen acetaminophen (TYLENOL) 500 MG tablet Take 500-1,000 mg by mouth every 6 (six) hours as needed for mild pain, moderate pain, fever or headache.    Marland Kitchen acyclovir (ZOVIRAX) 400 MG tablet TAKE 1 TABLET BY MOUTH TWICE DAILY 180 tablet 3  . aspirin EC 81 MG tablet Take 81 mg by mouth daily with breakfast.    . calcium carbonate (OSCAL) 1500 (600 Ca) MG TABS tablet Take 1,500 mg by mouth daily with breakfast.    . Calcium Carbonate 500 MG CHEW Chew 500 mg by mouth as needed for indigestion.    Marland Kitchen  Cholecalciferol (VITAMIN D3) 2000 units capsule Take 2,000 Units by mouth daily.     . famotidine (PEPCID) 20 MG tablet Take 20 mg by mouth daily.    . Multiple Vitamins-Minerals (CENTRUM SILVER PO) Take 1 tablet by mouth daily.     Marland Kitchen omeprazole (PRILOSEC) 20 MG capsule Take 1 capsule (20 mg total) by mouth daily. 90 capsule 11  . ondansetron (ZOFRAN) 8 MG tablet TAKE 1 TABLET(8 MG) BY MOUTH EVERY 8 HOURS AS NEEDED FOR NAUSEA OR VOMITING 60 tablet 11  . senna-docusate (SENOKOT-S) 8.6-50 MG tablet Take 1-2 tablets by mouth daily as needed for mild constipation or moderate constipation.     . sodium chloride (OCEAN)  0.65 % SOLN nasal spray Place 1 spray into both nostrils as needed for congestion.    . triamterene-hydrochlorothiazide (MAXZIDE-25) 37.5-25 MG tablet Take 0.5 tablets by mouth daily.    . vitamin B-12 (CYANOCOBALAMIN) 500 MCG tablet Take 500 mcg by mouth daily.     No current facility-administered medications for this visit.   Facility-Administered Medications Ordered in Other Visits  Medication Dose Route Frequency Provider Last Rate Last Admin  . 0.9 %  sodium chloride infusion   Intravenous Once Evlyn Amason, MD      . 0.9 %  sodium chloride infusion   Intravenous Once Alvy Bimler, Alexsis Branscom, MD      . daratumumab-hyaluronidase-fihj (DARZALEX FASPRO) 1800-30000 MG-UT/15ML chemo SQ injection 1,800 mg  1,800 mg Subcutaneous Once Alvy Bimler, Marget Outten, MD      . zolendronic acid (ZOMETA) 3 mg in sodium chloride 0.9 % 100 mL IVPB  3 mg Intravenous Once Alvy Bimler, Tjuana Vickrey, MD        PHYSICAL EXAMINATION: ECOG PERFORMANCE STATUS: 1 - Symptomatic but completely ambulatory  Vitals:   10/06/20 0846  BP: (!) 142/96  Pulse: 80  Resp: 17  Temp: (!) 97.1 F (36.2 C)  SpO2: 100%   Filed Weights   10/06/20 0846  Weight: 110 lb 12.8 oz (50.3 kg)    GENERAL:alert, no distress and comfortable SKIN: skin color, texture, turgor are normal, no rashes or significant lesions EYES: normal, Conjunctiva are pink and non-injected, sclera clear OROPHARYNX:no exudate, no erythema and lips, buccal mucosa, and tongue normal  NECK: supple, thyroid normal size, non-tender, without nodularity LYMPH:  no palpable lymphadenopathy in the cervical, axillary or inguinal LUNGS: clear to auscultation and percussion with normal breathing effort HEART: regular rate & rhythm and no murmurs and no lower extremity edema ABDOMEN:abdomen soft, non-tender and normal bowel sounds Musculoskeletal:no cyanosis of digits and no clubbing  NEURO: alert & oriented x 3 with fluent speech, no focal motor/sensory deficits  LABORATORY DATA:  I have reviewed  the data as listed    Component Value Date/Time   NA 139 10/06/2020 0829   NA 143 07/14/2017 1245   K 3.5 10/06/2020 0829   K 3.3 (L) 07/14/2017 1245   CL 109 10/06/2020 0829   CO2 24 10/06/2020 0829   CO2 20 (L) 07/14/2017 1245   GLUCOSE 85 10/06/2020 0829   GLUCOSE 106 07/14/2017 1245   BUN 13 10/06/2020 0829   BUN 9.3 07/14/2017 1245   CREATININE 1.01 (H) 10/06/2020 0829   CREATININE 1.07 (H) 01/14/2020 0828   CREATININE 0.8 07/14/2017 1245   CALCIUM 9.8 10/06/2020 0829   CALCIUM 8.4 07/14/2017 1245   PROT 7.3 10/06/2020 0829   PROT 6.0 07/14/2017 1245   PROT 6.2 (L) 07/14/2017 1245   ALBUMIN 4.3 10/06/2020 0829   ALBUMIN 3.6 07/14/2017 1245  AST 20 10/06/2020 0829   AST 21 01/14/2020 0828   AST 21 07/14/2017 1245   ALT 10 10/06/2020 0829   ALT 15 01/14/2020 0828   ALT 21 07/14/2017 1245   ALKPHOS 56 10/06/2020 0829   ALKPHOS 78 07/14/2017 1245   BILITOT 0.5 10/06/2020 0829   BILITOT 0.8 01/14/2020 0828   BILITOT 0.43 07/14/2017 1245   GFRNONAA 60 (L) 10/06/2020 0829   GFRNONAA 53 (L) 01/14/2020 0828   GFRAA 57 (L) 04/21/2020 0817   GFRAA >60 01/14/2020 0828    No results found for: SPEP, UPEP  Lab Results  Component Value Date   WBC 5.3 10/06/2020   NEUTROABS 2.1 10/06/2020   HGB 12.0 10/06/2020   HCT 37.7 10/06/2020   MCV 82.3 10/06/2020   PLT 324 10/06/2020      Chemistry      Component Value Date/Time   NA 139 10/06/2020 0829   NA 143 07/14/2017 1245   K 3.5 10/06/2020 0829   K 3.3 (L) 07/14/2017 1245   CL 109 10/06/2020 0829   CO2 24 10/06/2020 0829   CO2 20 (L) 07/14/2017 1245   BUN 13 10/06/2020 0829   BUN 9.3 07/14/2017 1245   CREATININE 1.01 (H) 10/06/2020 0829   CREATININE 1.07 (H) 01/14/2020 0828   CREATININE 0.8 07/14/2017 1245      Component Value Date/Time   CALCIUM 9.8 10/06/2020 0829   CALCIUM 8.4 07/14/2017 1245   ALKPHOS 56 10/06/2020 0829   ALKPHOS 78 07/14/2017 1245   AST 20 10/06/2020 0829   AST 21 01/14/2020 0828    AST 21 07/14/2017 1245   ALT 10 10/06/2020 0829   ALT 15 01/14/2020 0828   ALT 21 07/14/2017 1245   BILITOT 0.5 10/06/2020 0829   BILITOT 0.8 01/14/2020 0828   BILITOT 0.43 07/14/2017 1245

## 2020-10-06 NOTE — Assessment & Plan Note (Signed)
I have reviewed recent myeloma panel with the patient and her husband I have noticed the fluctuation M protein and light chains are slightly trending up She is not symptomatic for now, I recommend we continue on Faspro maintenance treatment We will continue myeloma panel once a month If her M protein start to trend higher than 0.5, we will start myeloma work-up again and consider adding additional therapy She agreed with the plan of care She will continue Zometa every 3 months along with calcium and vitamin D supplement, due today I will call them with test results at the end of the week and if her M protein is higher, we can either set up a virtual meeting or meet together again to discuss treatment options

## 2020-10-07 LAB — KAPPA/LAMBDA LIGHT CHAINS
Kappa free light chain: 25.2 mg/L — ABNORMAL HIGH (ref 3.3–19.4)
Kappa, lambda light chain ratio: 14 — ABNORMAL HIGH (ref 0.26–1.65)
Lambda free light chains: 1.8 mg/L — ABNORMAL LOW (ref 5.7–26.3)

## 2020-10-08 LAB — MULTIPLE MYELOMA PANEL, SERUM
Albumin SerPl Elph-Mcnc: 4.3 g/dL (ref 2.9–4.4)
Albumin/Glob SerPl: 1.7 (ref 0.7–1.7)
Alpha 1: 0.2 g/dL (ref 0.0–0.4)
Alpha2 Glob SerPl Elph-Mcnc: 0.8 g/dL (ref 0.4–1.0)
B-Globulin SerPl Elph-Mcnc: 1.1 g/dL (ref 0.7–1.3)
Gamma Glob SerPl Elph-Mcnc: 0.5 g/dL (ref 0.4–1.8)
Globulin, Total: 2.6 g/dL (ref 2.2–3.9)
IgA: 8 mg/dL — ABNORMAL LOW (ref 87–352)
IgG (Immunoglobin G), Serum: 625 mg/dL (ref 586–1602)
IgM (Immunoglobulin M), Srm: 5 mg/dL — ABNORMAL LOW (ref 26–217)
M Protein SerPl Elph-Mcnc: 0.2 g/dL — ABNORMAL HIGH
Total Protein ELP: 6.9 g/dL (ref 6.0–8.5)

## 2020-10-09 ENCOUNTER — Telehealth: Payer: Self-pay

## 2020-10-09 NOTE — Telephone Encounter (Signed)
-----   Message from [name], MD sent at 10/09/2020 10:18 AM EST ----- Pls let her know M protein is stable Continue Rx but advise her to drink more liquids before her next Rx

## 2020-10-09 NOTE — Telephone Encounter (Signed)
Called and given below message. She verbalized understanding. 

## 2020-11-04 ENCOUNTER — Inpatient Hospital Stay (HOSPITAL_BASED_OUTPATIENT_CLINIC_OR_DEPARTMENT_OTHER): Payer: Medicare Other | Admitting: Hematology and Oncology

## 2020-11-04 ENCOUNTER — Inpatient Hospital Stay: Payer: Medicare Other

## 2020-11-04 ENCOUNTER — Encounter: Payer: Self-pay | Admitting: Hematology and Oncology

## 2020-11-04 ENCOUNTER — Inpatient Hospital Stay: Payer: Medicare Other | Attending: Hematology and Oncology

## 2020-11-04 ENCOUNTER — Other Ambulatory Visit: Payer: Self-pay

## 2020-11-04 DIAGNOSIS — Z7189 Other specified counseling: Secondary | ICD-10-CM

## 2020-11-04 DIAGNOSIS — Z881 Allergy status to other antibiotic agents status: Secondary | ICD-10-CM | POA: Insufficient documentation

## 2020-11-04 DIAGNOSIS — C9002 Multiple myeloma in relapse: Secondary | ICD-10-CM | POA: Insufficient documentation

## 2020-11-04 DIAGNOSIS — R519 Headache, unspecified: Secondary | ICD-10-CM | POA: Diagnosis not present

## 2020-11-04 DIAGNOSIS — Z79899 Other long term (current) drug therapy: Secondary | ICD-10-CM | POA: Diagnosis not present

## 2020-11-04 DIAGNOSIS — Z9484 Stem cells transplant status: Secondary | ICD-10-CM | POA: Diagnosis not present

## 2020-11-04 DIAGNOSIS — C9001 Multiple myeloma in remission: Secondary | ICD-10-CM

## 2020-11-04 DIAGNOSIS — D638 Anemia in other chronic diseases classified elsewhere: Secondary | ICD-10-CM

## 2020-11-04 DIAGNOSIS — J984 Other disorders of lung: Secondary | ICD-10-CM | POA: Insufficient documentation

## 2020-11-04 DIAGNOSIS — Z923 Personal history of irradiation: Secondary | ICD-10-CM | POA: Insufficient documentation

## 2020-11-04 DIAGNOSIS — Z5112 Encounter for antineoplastic immunotherapy: Secondary | ICD-10-CM | POA: Insufficient documentation

## 2020-11-04 DIAGNOSIS — Z886 Allergy status to analgesic agent status: Secondary | ICD-10-CM | POA: Insufficient documentation

## 2020-11-04 DIAGNOSIS — D6481 Anemia due to antineoplastic chemotherapy: Secondary | ICD-10-CM | POA: Diagnosis not present

## 2020-11-04 DIAGNOSIS — E876 Hypokalemia: Secondary | ICD-10-CM | POA: Insufficient documentation

## 2020-11-04 DIAGNOSIS — Z885 Allergy status to narcotic agent status: Secondary | ICD-10-CM | POA: Diagnosis not present

## 2020-11-04 DIAGNOSIS — T451X5A Adverse effect of antineoplastic and immunosuppressive drugs, initial encounter: Secondary | ICD-10-CM | POA: Insufficient documentation

## 2020-11-04 DIAGNOSIS — Z880 Allergy status to penicillin: Secondary | ICD-10-CM | POA: Diagnosis not present

## 2020-11-04 LAB — COMPREHENSIVE METABOLIC PANEL
ALT: 13 U/L (ref 0–44)
AST: 18 U/L (ref 15–41)
Albumin: 4.3 g/dL (ref 3.5–5.0)
Alkaline Phosphatase: 59 U/L (ref 38–126)
Anion gap: 11 (ref 5–15)
BUN: 10 mg/dL (ref 8–23)
CO2: 24 mmol/L (ref 22–32)
Calcium: 8.8 mg/dL — ABNORMAL LOW (ref 8.9–10.3)
Chloride: 109 mmol/L (ref 98–111)
Creatinine, Ser: 0.87 mg/dL (ref 0.44–1.00)
GFR, Estimated: >60 mL/min (ref >60)
Glucose, Bld: 78 mg/dL (ref 70–99)
Potassium: 3.3 mmol/L — ABNORMAL LOW (ref 3.5–5.1)
Sodium: 144 mmol/L (ref 135–145)
Total Bilirubin: 0.6 mg/dL (ref 0.3–1.2)
Total Protein: 7.2 g/dL (ref 6.5–8.1)

## 2020-11-04 LAB — CBC WITH DIFFERENTIAL/PLATELET
Abs Immature Granulocytes: 0.02 10*3/uL (ref 0.00–0.07)
Basophils Absolute: 0.1 10*3/uL (ref 0.0–0.1)
Basophils Relative: 1 %
Eosinophils Absolute: 0.2 10*3/uL (ref 0.0–0.5)
Eosinophils Relative: 5 %
HCT: 36.1 % (ref 36.0–46.0)
Hemoglobin: 11.6 g/dL — ABNORMAL LOW (ref 12.0–15.0)
Immature Granulocytes: 0 %
Lymphocytes Relative: 47 %
Lymphs Abs: 2.3 10*3/uL (ref 0.7–4.0)
MCH: 26.1 pg (ref 26.0–34.0)
MCHC: 32.1 g/dL (ref 30.0–36.0)
MCV: 81.3 fL (ref 80.0–100.0)
Monocytes Absolute: 0.4 10*3/uL (ref 0.1–1.0)
Monocytes Relative: 8 %
Neutro Abs: 1.9 10*3/uL (ref 1.7–7.7)
Neutrophils Relative %: 39 %
Platelets: 343 10*3/uL (ref 150–400)
RBC: 4.44 MIL/uL (ref 3.87–5.11)
RDW: 14.5 % (ref 11.5–15.5)
WBC: 5 10*3/uL (ref 4.0–10.5)
nRBC: 0 % (ref 0.0–0.2)

## 2020-11-04 MED ORDER — DEXAMETHASONE 4 MG PO TABS
8.0000 mg | ORAL_TABLET | Freq: Once | ORAL | Status: AC
  Administered 2020-11-04: 8 mg via ORAL

## 2020-11-04 MED ORDER — MONTELUKAST SODIUM 10 MG PO TABS
10.0000 mg | ORAL_TABLET | Freq: Once | ORAL | Status: AC
  Administered 2020-11-04: 10 mg via ORAL

## 2020-11-04 MED ORDER — ACETAMINOPHEN 325 MG PO TABS
ORAL_TABLET | ORAL | Status: AC
  Filled 2020-11-04: qty 2

## 2020-11-04 MED ORDER — DEXAMETHASONE 4 MG PO TABS
ORAL_TABLET | ORAL | Status: AC
  Filled 2020-11-04: qty 2

## 2020-11-04 MED ORDER — DARATUMUMAB-HYALURONIDASE-FIHJ 1800-30000 MG-UT/15ML ~~LOC~~ SOLN
1800.0000 mg | Freq: Once | SUBCUTANEOUS | Status: AC
  Administered 2020-11-04: 1800 mg via SUBCUTANEOUS
  Filled 2020-11-04: qty 15

## 2020-11-04 MED ORDER — MONTELUKAST SODIUM 10 MG PO TABS
ORAL_TABLET | ORAL | Status: AC
  Filled 2020-11-04: qty 1

## 2020-11-04 MED ORDER — ACETAMINOPHEN 325 MG PO TABS
650.0000 mg | ORAL_TABLET | Freq: Once | ORAL | Status: AC
  Administered 2020-11-04: 650 mg via ORAL

## 2020-11-04 NOTE — Assessment & Plan Note (Signed)
This is likely related to recent diarrhea She is not symptomatic Observe only

## 2020-11-04 NOTE — Assessment & Plan Note (Signed)
I have reviewed recent myeloma panel with the patient  I have noticed the fluctuation M protein and light chains are slightly trending up She is not symptomatic for now, I recommend we continue on Faspro maintenance treatment We will continue myeloma panel once a month If her M protein start to trend higher than 0.5, we will start myeloma work-up again and consider adding additional therapy She agreed with the plan of care She will continue Zometa every 3 months along with calcium and vitamin D supplement, due in May I will call them with test results at the end of the week and if her M protein is higher, we can either set up a virtual meeting or meet together again to discuss treatment options

## 2020-11-04 NOTE — Patient Instructions (Signed)
[facility] Discharge Instructions for Patients Receiving Chemotherapy  Today you received the following chemotherapy agent: Daratumumab (Darzalex Faspro)  To help prevent nausea and vomiting after your treatment, we encourage you to take your nausea medication as directed by your MD.   If you develop nausea and vomiting that is not controlled by your nausea medication, call the clinic.   BELOW ARE SYMPTOMS THAT SHOULD BE REPORTED IMMEDIATELY:  *FEVER GREATER THAN 100.5 F  *CHILLS WITH OR WITHOUT FEVER  NAUSEA AND VOMITING THAT IS NOT CONTROLLED WITH YOUR NAUSEA MEDICATION  *UNUSUAL SHORTNESS OF BREATH  *UNUSUAL BRUISING OR BLEEDING  TENDERNESS IN MOUTH AND THROAT WITH OR WITHOUT PRESENCE OF ULCERS  *URINARY PROBLEMS  *BOWEL PROBLEMS  UNUSUAL RASH Items with * indicate a potential emergency and should be followed up as soon as possible.  Feel free to call the clinic should you have any questions or concerns. The clinic phone number is [phone].  Please show the [facility] at check-in to the Emergency Department and triage nurse.

## 2020-11-04 NOTE — Assessment & Plan Note (Signed)
She had prior radiation therapy to the right side of her head Her recent symptoms could be exacerbated by recent Zometa We discussed conservative approach

## 2020-11-04 NOTE — Progress Notes (Signed)
[place] [place] OFFICE PROGRESS NOTE  Patient Care Team: Josetta Huddle, MD as PCP - General (Internal Medicine) Ginette Pitman, MD as Consulting Physician (Hematology and Oncology) Hessie Dibble, MD as Consulting Physician (Hematology and Oncology)  ASSESSMENT & PLAN:  Multiple myeloma in relapse [facility]) I have reviewed recent myeloma panel with the patient  I have noticed the fluctuation M protein and light chains are slightly trending up She is not symptomatic for now, I recommend we continue on Faspro maintenance treatment We will continue myeloma panel once a month If her M protein start to trend higher than 0.5, we will start myeloma work-up again and consider adding additional therapy She agreed with the plan of care She will continue Zometa every 3 months along with calcium and vitamin D supplement, due in May I will call them with test results at the end of the week and if her M protein is higher, we can either set up a virtual meeting or meet together again to discuss treatment options  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  Observe   Hypokalemia This is likely related to recent diarrhea She is not symptomatic Observe only  Right sided temporal headache She had prior radiation therapy to the right side of her head Her recent symptoms could be exacerbated by recent Zometa We discussed conservative approach   No orders of the defined types were placed in this encounter.   All questions were answered. The patient knows to call the clinic with any problems, questions or concerns. The total time spent in the appointment was 20 minutes encounter with patients including review of chart and various tests results, discussions about plan of care and coordination of care plan   [name], MD 11/04/2020 10:40 AM  INTERVAL HISTORY: Please see below for  problem oriented charting. She returns for treatment and follow-up She had recent intermittent headache Her headache is described as severe, affecting the right side of her face in the temporal area, close to her previous radiation site She has been taking Tylenol as needed No new bone pain No recent infection, fever or chills  SUMMARY OF ONCOLOGIC HISTORY: Oncology History Overview Note   M-protein 0.69 gm/dl IFIX - IgG, Kappa IgG - 868 IgA - 19 IgM - < 20 Kappa - 21 Lambda - 5.7  09/06/2014 - Bone marrow aspirate and biopsy:   Normocellular marrow for age (40%) with a small monoclonal plasma cell population (1% on aspirate). Karyotype 24, XX  FISH Negative for myeloma associated changes  09/12/2014 - PET/CT  Two regions that are concerning for disease, one adjacent/involving the left ninth rib and one in the marrow of the right femur, in this patient with history of plasmacytoma.    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   04/01/2015 Bone  Marrow Transplant   She received melphalan chemotherapy on 03/31/2015 followed by autologous stem cell transplant the day after   04/03/2015 - 04/18/2015 Hospital Admission   The patient was admitted to the hospital at [facility] for management related to complication from stem cell transplant. She had significant nausea requiring intravenous anti-emetics.   07/17/2015 -  Chemotherapy   She started maintenance Revlimid and monthly zometa, then every 3 months   06/01/2018 Imaging   DEXA scan showed bone density T score in femur -2.3   04/20/2019 PET scan   1. No FDG avid osseous lesions or mass identified to suggest metabolically active lesion of myeloma or plasmacytoma. 2. Small nodular density within the paravertebral right lower lobe exhibits mild to moderate increased uptake within SUV max of 3.38. This is indeterminate. Review of CT chest from 10/05/2018 shows a corresponding Lung nodule in this area measuring the same. Small pulmonary neoplasm cannot be excluded. 3. Indeterminate, focal area of increased uptake is identified within the thoracic canal. Indeterminate favored to represent benign physiologic CNS activity.     04/30/2019 -  Chemotherapy      Patient is on Antibody Plan: MYELOMA SQ DARATUMUMAB FASPRO Q28D    04/30/2019 - 07/09/2019 Chemotherapy   The patient had bortezomib SQ (VELCADE) chemo injection 1.75 mg, 1.3 mg/m2 = 1.75 mg, Subcutaneous,  Once, 10 of 11 cycles Administration: 1.75 mg (04/30/2019), 1.75 mg (05/07/2019), 1.75 mg (05/14/2019), 1.75 mg (05/21/2019), 1.75 mg (05/28/2019), 1.75 mg (06/04/2019), 1.75 mg (06/11/2019), 1.75 mg (06/18/2019), 1.75 mg (06/25/2019), 1.75 mg (07/09/2019)  for chemotherapy treatment.      REVIEW OF SYSTEMS:   Constitutional: Denies fevers, chills or abnormal weight loss Eyes: Denies blurriness of vision Ears, nose, mouth, throat, and face: Denies mucositis or sore throat Respiratory: Denies cough, dyspnea or wheezes Cardiovascular:  Denies palpitation, chest discomfort or lower extremity swelling Gastrointestinal:  Denies nausea, heartburn or change in bowel habits Skin: Denies abnormal skin rashes Lymphatics: Denies new lymphadenopathy or easy bruising Neurological:Denies numbness, tingling or new weaknesses Behavioral/Psych: Mood is stable, no new changes  All other systems were reviewed with the patient and are negative.  I have reviewed the past medical history, past surgical history, social history and family history with the patient and they are unchanged from previous note.  ALLERGIES:  is allergic to augmentin [amoxicillin-pot clavulanate], albuterol, codeine, ibuprofen, nsaids, and tolmetin.  MEDICATIONS:  Current Outpatient Medications  Medication Sig Dispense Refill  . acetaminophen (TYLENOL) 500 MG tablet Take 500-1,000 mg by mouth every 6 (six) hours as needed for mild pain, moderate pain, fever or headache.    Marland Kitchen acyclovir (ZOVIRAX) 400 MG tablet TAKE 1 TABLET BY MOUTH TWICE DAILY 180 tablet 3  . aspirin EC 81 MG tablet Take 81 mg by mouth daily with breakfast.    . calcium carbonate (OSCAL) 1500 (600 Ca) MG TABS tablet Take 1,500 mg by mouth daily with breakfast.    . Calcium Carbonate 500 MG CHEW Chew 500 mg by mouth as needed for indigestion.    Marland Kitchen  Cholecalciferol (VITAMIN D3) 2000 units capsule Take 2,000 Units by mouth daily.     . famotidine (PEPCID) 20 MG tablet Take 20 mg by mouth daily.    . fluorometholone (FML) 0.1 % ophthalmic suspension Place 1 drop into both eyes 3 (three) times daily.    . Multiple Vitamins-Minerals (CENTRUM SILVER PO) Take 1 tablet by mouth daily.     Marland Kitchen omeprazole (PRILOSEC) 20 MG capsule Take 1 capsule (20 mg total) by mouth daily. 90 capsule 11  . ondansetron (ZOFRAN) 8 MG tablet TAKE 1 TABLET(8 MG) BY MOUTH EVERY 8 HOURS AS NEEDED FOR NAUSEA OR VOMITING 60 tablet 11  . senna-docusate (SENOKOT-S) 8.6-50 MG tablet Take 1-2 tablets by mouth daily as needed for mild  constipation or moderate constipation.     . sodium chloride (OCEAN) 0.65 % SOLN nasal spray Place 1 spray into both nostrils as needed for congestion.    . triamcinolone ointment (KENALOG) 0.1 % Apply 1 application topically daily.    Marland Kitchen triamterene-hydrochlorothiazide (MAXZIDE-25) 37.5-25 MG tablet Take 0.5 tablets by mouth daily.    . vitamin B-12 (CYANOCOBALAMIN) 500 MCG tablet Take 500 mcg by mouth daily.     No current facility-administered medications for this visit.   Facility-Administered Medications Ordered in Other Visits  Medication Dose Route Frequency Provider Last Rate Last Admin  . 0.9 %  sodium chloride infusion   Intravenous Once Leilan Bochenek, MD      . daratumumab-hyaluronidase-fihj (DARZALEX FASPRO) 1800-30000 MG-UT/15ML chemo SQ injection 1,800 mg  1,800 mg Subcutaneous Once Alvy Bimler, Bich Mchaney, MD        PHYSICAL EXAMINATION: ECOG PERFORMANCE STATUS: 1 - Symptomatic but completely ambulatory  Vitals:   11/04/20 0850  BP: (!) 148/95  Pulse: 79  Resp: 18  Temp: (!) 97 F (36.1 C)  SpO2: 100%   Filed Weights   11/04/20 0850  Weight: 111 lb 12.8 oz (50.7 kg)    GENERAL:alert, no distress and comfortable NEURO: alert & oriented x 3 with fluent speech, no focal motor/sensory deficits  LABORATORY DATA:  I have reviewed the data as listed    Component Value Date/Time   NA 144 11/04/2020 0838   NA 143 07/14/2017 1245   K 3.3 (L) 11/04/2020 0838   K 3.3 (L) 07/14/2017 1245   CL 109 11/04/2020 0838   CO2 24 11/04/2020 0838   CO2 20 (L) 07/14/2017 1245   GLUCOSE 78 11/04/2020 0838   GLUCOSE 106 07/14/2017 1245   BUN 10 11/04/2020 0838   BUN 9.3 07/14/2017 1245   CREATININE 0.87 11/04/2020 0838   CREATININE 1.07 (H) 01/14/2020 0828   CREATININE 0.8 07/14/2017 1245   CALCIUM 8.8 (L) 11/04/2020 0838   CALCIUM 8.4 07/14/2017 1245   PROT 7.2 11/04/2020 0838   PROT 6.0 07/14/2017 1245   PROT 6.2 (L) 07/14/2017 1245   ALBUMIN 4.3 11/04/2020 0838   ALBUMIN 3.6  07/14/2017 1245   AST 18 11/04/2020 0838   AST 21 01/14/2020 0828   AST 21 07/14/2017 1245   ALT 13 11/04/2020 0838   ALT 15 01/14/2020 0828   ALT 21 07/14/2017 1245   ALKPHOS 59 11/04/2020 0838   ALKPHOS 78 07/14/2017 1245   BILITOT 0.6 11/04/2020 0838   BILITOT 0.8 01/14/2020 0828   BILITOT 0.43 07/14/2017 1245   GFRNONAA >60 11/04/2020 0838   GFRNONAA 53 (L) 01/14/2020 0828   GFRAA 57 (L) 04/21/2020 0817   GFRAA >60 01/14/2020 0828    No results found for: SPEP, UPEP  Lab Results  Component Value Date   WBC 5.0 11/04/2020   NEUTROABS 1.9 11/04/2020   HGB 11.6 (L) 11/04/2020   HCT 36.1 11/04/2020   MCV 81.3 11/04/2020   PLT 343 11/04/2020      Chemistry      Component Value Date/Time   NA 144 11/04/2020 0838   NA 143 07/14/2017 1245   K 3.3 (L) 11/04/2020 0838   K 3.3 (L) 07/14/2017 1245   CL 109 11/04/2020 0838   CO2 24 11/04/2020 0838   CO2 20 (L) 07/14/2017 1245   BUN 10 11/04/2020 0838   BUN 9.3 07/14/2017 1245   CREATININE 0.87 11/04/2020 0838   CREATININE 1.07 (H) 01/14/2020 0828   CREATININE 0.8 07/14/2017 1245      Component Value Date/Time   CALCIUM 8.8 (L) 11/04/2020 0838   CALCIUM 8.4 07/14/2017 1245   ALKPHOS 59 11/04/2020 0838   ALKPHOS 78 07/14/2017 1245   AST 18 11/04/2020 0838   AST 21 01/14/2020 0828   AST 21 07/14/2017 1245   ALT 13 11/04/2020 0838   ALT 15 01/14/2020 0828   ALT 21 07/14/2017 1245   BILITOT 0.6 11/04/2020 0838   BILITOT 0.8 01/14/2020 0828   BILITOT 0.43 07/14/2017 1245

## 2020-11-04 NOTE — Assessment & Plan Note (Signed)
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  Observe

## 2020-11-05 LAB — KAPPA/LAMBDA LIGHT CHAINS
Kappa free light chain: 19.6 mg/L — ABNORMAL HIGH (ref 3.3–19.4)
Kappa, lambda light chain ratio: 8.52 — ABNORMAL HIGH (ref 0.26–1.65)
Lambda free light chains: 2.3 mg/L — ABNORMAL LOW (ref 5.7–26.3)

## 2020-11-06 LAB — MULTIPLE MYELOMA PANEL, SERUM
Albumin SerPl Elph-Mcnc: 3.9 g/dL (ref 2.9–4.4)
Albumin/Glob SerPl: 1.4 (ref 0.7–1.7)
Alpha 1: 0.3 g/dL (ref 0.0–0.4)
Alpha2 Glob SerPl Elph-Mcnc: 0.8 g/dL (ref 0.4–1.0)
B-Globulin SerPl Elph-Mcnc: 1.1 g/dL (ref 0.7–1.3)
Gamma Glob SerPl Elph-Mcnc: 0.6 g/dL (ref 0.4–1.8)
Globulin, Total: 2.8 g/dL (ref 2.2–3.9)
IgA: 8 mg/dL — ABNORMAL LOW (ref 64–422)
IgG (Immunoglobin G), Serum: 708 mg/dL (ref 586–1602)
IgM (Immunoglobulin M), Srm: 5 mg/dL — ABNORMAL LOW (ref 26–217)
M Protein SerPl Elph-Mcnc: 0.4 g/dL — ABNORMAL HIGH
Total Protein ELP: 6.7 g/dL (ref 6.0–8.5)

## 2020-11-10 ENCOUNTER — Telehealth: Payer: Self-pay

## 2020-11-10 NOTE — Telephone Encounter (Signed)
-----   Message from [name], MD sent at 11/10/2020  8:50 AM EDT ----- Pls call her, myeloma panel is stable

## 2020-11-10 NOTE — Telephone Encounter (Signed)
Patient notified of lab results per MD review.  No further needs at this time.

## 2020-12-02 ENCOUNTER — Other Ambulatory Visit: Payer: Self-pay | Admitting: Hematology and Oncology

## 2020-12-02 ENCOUNTER — Inpatient Hospital Stay (HOSPITAL_BASED_OUTPATIENT_CLINIC_OR_DEPARTMENT_OTHER): Payer: Medicare Other | Admitting: Hematology and Oncology

## 2020-12-02 ENCOUNTER — Other Ambulatory Visit: Payer: Self-pay

## 2020-12-02 ENCOUNTER — Encounter: Payer: Self-pay | Admitting: Hematology and Oncology

## 2020-12-02 ENCOUNTER — Inpatient Hospital Stay: Payer: Medicare Other

## 2020-12-02 ENCOUNTER — Telehealth: Payer: Self-pay

## 2020-12-02 ENCOUNTER — Inpatient Hospital Stay: Payer: Medicare Other | Attending: Hematology and Oncology

## 2020-12-02 DIAGNOSIS — D638 Anemia in other chronic diseases classified elsewhere: Secondary | ICD-10-CM | POA: Diagnosis not present

## 2020-12-02 DIAGNOSIS — Z79899 Other long term (current) drug therapy: Secondary | ICD-10-CM | POA: Insufficient documentation

## 2020-12-02 DIAGNOSIS — Z880 Allergy status to penicillin: Secondary | ICD-10-CM | POA: Insufficient documentation

## 2020-12-02 DIAGNOSIS — R531 Weakness: Secondary | ICD-10-CM | POA: Insufficient documentation

## 2020-12-02 DIAGNOSIS — Z9484 Stem cells transplant status: Secondary | ICD-10-CM | POA: Insufficient documentation

## 2020-12-02 DIAGNOSIS — Z5112 Encounter for antineoplastic immunotherapy: Secondary | ICD-10-CM | POA: Diagnosis present

## 2020-12-02 DIAGNOSIS — T451X5A Adverse effect of antineoplastic and immunosuppressive drugs, initial encounter: Secondary | ICD-10-CM | POA: Diagnosis not present

## 2020-12-02 DIAGNOSIS — Z886 Allergy status to analgesic agent status: Secondary | ICD-10-CM | POA: Diagnosis not present

## 2020-12-02 DIAGNOSIS — C9002 Multiple myeloma in relapse: Secondary | ICD-10-CM | POA: Diagnosis present

## 2020-12-02 DIAGNOSIS — Z7189 Other specified counseling: Secondary | ICD-10-CM

## 2020-12-02 DIAGNOSIS — Z881 Allergy status to other antibiotic agents status: Secondary | ICD-10-CM | POA: Diagnosis not present

## 2020-12-02 DIAGNOSIS — D6481 Anemia due to antineoplastic chemotherapy: Secondary | ICD-10-CM | POA: Diagnosis not present

## 2020-12-02 DIAGNOSIS — Z885 Allergy status to narcotic agent status: Secondary | ICD-10-CM | POA: Insufficient documentation

## 2020-12-02 DIAGNOSIS — C9001 Multiple myeloma in remission: Secondary | ICD-10-CM

## 2020-12-02 DIAGNOSIS — J984 Other disorders of lung: Secondary | ICD-10-CM | POA: Diagnosis not present

## 2020-12-02 LAB — COMPREHENSIVE METABOLIC PANEL
ALT: 10 U/L (ref 0–44)
AST: 21 U/L (ref 15–41)
Albumin: 4.2 g/dL (ref 3.5–5.0)
Alkaline Phosphatase: 53 U/L (ref 38–126)
Anion gap: 10 (ref 5–15)
BUN: 12 mg/dL (ref 8–23)
CO2: 25 mmol/L (ref 22–32)
Calcium: 9.5 mg/dL (ref 8.9–10.3)
Chloride: 108 mmol/L (ref 98–111)
Creatinine, Ser: 0.98 mg/dL (ref 0.44–1.00)
GFR, Estimated: >60 mL/min (ref >60)
Glucose, Bld: 80 mg/dL (ref 70–99)
Potassium: 3.5 mmol/L (ref 3.5–5.1)
Sodium: 143 mmol/L (ref 135–145)
Total Bilirubin: 0.7 mg/dL (ref 0.3–1.2)
Total Protein: 7.1 g/dL (ref 6.5–8.1)

## 2020-12-02 LAB — CBC WITH DIFFERENTIAL/PLATELET
Abs Immature Granulocytes: 0.01 10*3/uL (ref 0.00–0.07)
Basophils Absolute: 0.1 10*3/uL (ref 0.0–0.1)
Basophils Relative: 2 %
Eosinophils Absolute: 0.2 10*3/uL (ref 0.0–0.5)
Eosinophils Relative: 4 %
HCT: 35.2 % — ABNORMAL LOW (ref 36.0–46.0)
Hemoglobin: 11.6 g/dL — ABNORMAL LOW (ref 12.0–15.0)
Immature Granulocytes: 0 %
Lymphocytes Relative: 50 %
Lymphs Abs: 2.3 10*3/uL (ref 0.7–4.0)
MCH: 27.1 pg (ref 26.0–34.0)
MCHC: 33 g/dL (ref 30.0–36.0)
MCV: 82.2 fL (ref 80.0–100.0)
Monocytes Absolute: 0.5 10*3/uL (ref 0.1–1.0)
Monocytes Relative: 10 %
Neutro Abs: 1.6 10*3/uL — ABNORMAL LOW (ref 1.7–7.7)
Neutrophils Relative %: 34 %
Platelets: 301 10*3/uL (ref 150–400)
RBC: 4.28 MIL/uL (ref 3.87–5.11)
RDW: 15.1 % (ref 11.5–15.5)
WBC: 4.6 10*3/uL (ref 4.0–10.5)
nRBC: 0 % (ref 0.0–0.2)

## 2020-12-02 MED ORDER — MONTELUKAST SODIUM 10 MG PO TABS
10.0000 mg | ORAL_TABLET | Freq: Once | ORAL | Status: AC
  Administered 2020-12-02: 10 mg via ORAL

## 2020-12-02 MED ORDER — DEXAMETHASONE 4 MG PO TABS
ORAL_TABLET | ORAL | Status: AC
  Filled 2020-12-02: qty 2

## 2020-12-02 MED ORDER — DEXAMETHASONE 4 MG PO TABS
8.0000 mg | ORAL_TABLET | Freq: Once | ORAL | Status: AC
  Administered 2020-12-02: 8 mg via ORAL

## 2020-12-02 MED ORDER — MONTELUKAST SODIUM 10 MG PO TABS
ORAL_TABLET | ORAL | Status: AC
  Filled 2020-12-02: qty 1

## 2020-12-02 MED ORDER — DARATUMUMAB-HYALURONIDASE-FIHJ 1800-30000 MG-UT/15ML ~~LOC~~ SOLN
1800.0000 mg | Freq: Once | SUBCUTANEOUS | Status: AC
  Administered 2020-12-02: 1800 mg via SUBCUTANEOUS
  Filled 2020-12-02: qty 15

## 2020-12-02 MED ORDER — ACETAMINOPHEN 325 MG PO TABS
ORAL_TABLET | ORAL | Status: AC
  Filled 2020-12-02: qty 2

## 2020-12-02 MED ORDER — ACETAMINOPHEN 325 MG PO TABS
650.0000 mg | ORAL_TABLET | Freq: Once | ORAL | Status: AC
Start: 2020-12-02 — End: 2020-12-02
  Administered 2020-12-02: 650 mg via ORAL

## 2020-12-02 NOTE — Assessment & Plan Note (Signed)
I have reviewed recent myeloma panel with the patient  I have noticed the fluctuation M protein and light chains are slightly trending up She is not symptomatic for now, I recommend we continue on Faspro maintenance treatment We will continue myeloma panel once a month If her M protein start to trend higher than 0.5, we will start myeloma work-up again and consider adding additional therapy She agreed with the plan of care She will continue Zometa every 3 months along with calcium and vitamin D supplement, due in May I will order a bone density scan to be done to assess her bone density before her next dose I will call them with test results at the end of the week and if her M protein is higher, we can either set up a virtual meeting or meet together again to discuss treatment options

## 2020-12-02 NOTE — Progress Notes (Signed)
[name] OFF[facility] PROGRESS NOTE  Patient Care Team: Josetta Huddle, MD as PCP - General (Internal Medicine) Ginette Pitman, MD as Consulting Physician (Hematology and Oncology) Hessie Dibble, MD as Consulting Physician (Hematology and Oncology)  ASSESSMENT & PLAN:  Multiple myeloma in relapse [facility]) I have reviewed recent myeloma panel with the patient  I have noticed the fluctuation M protein and light chains are slightly trending up She is not symptomatic for now, I recommend we continue on Faspro maintenance treatment We will continue myeloma panel once a month If her M protein start to trend higher than 0.5, we will start myeloma work-up again and consider adding additional therapy She agreed with the plan of care She will continue Zometa every 3 months along with calcium and vitamin D supplement, due in May I will order a bone density scan to be done to assess her bone density before her next dose I will call them with test results at the end of the week and if her M protein is higher, we can either set up a virtual meeting or meet together again to discuss treatment options  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  Observe    Orders Placed This Encounter  Procedures  . DG Bone Density    Standing Status:   Future    Standing Expiration Date:   12/02/2021    Order Specific Question:   Reason for Exam (SYMPTOM  OR DIAGNOSIS REQUIRED)    Answer:   osteopenia, multiple myeloma    Order Specific Question:   Preferred imaging location?    Answer:   [facility]    All questions were answered. The patient knows to call the clinic with any problems, questions or concerns. The total time spent in the appointment was 20 minutes encounter with patients including review of chart and various tests results, discussions about plan of care and coordination of  care plan   [name], MD 12/02/2020 10:53 AM  INTERVAL HISTORY: Please see below for problem oriented charting. She returns for treatment and follow-up She denies recent side effects of treatment No recent infection No recent dental issues She felt a bit weak and has not been exercising recently She complained of mild allergies symptoms  SUMMARY OF ONCOLOGIC HISTORY: Oncology History Overview Note   M-protein 0.69 gm/dl IFIX - IgG, Kappa IgG - 868 IgA - 19 IgM - < 20 Kappa - 21 Lambda - 5.7  09/06/2014 - Bone marrow aspirate and biopsy:   Normocellular marrow for age (40%) with a small monoclonal plasma cell population (1% on aspirate). Karyotype 81, XX  FISH Negative for myeloma associated changes  09/12/2014 - PET/CT  Two regions that are concerning for disease, one adjacent/involving the left ninth rib and one in the marrow of the right femur, in this patient with history of plasmacytoma.    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    04/01/2015 Bone Marrow Transplant   She received melphalan chemotherapy on 03/31/2015 followed by autologous stem cell transplant the day after   04/03/2015 - 04/18/2015 Hospital Admission   The patient was admitted to the hospital at [place] for management related to complication from stem cell transplant. She had significant nausea requiring intravenous anti-emetics.   07/17/2015 -  Chemotherapy   She started maintenance Revlimid and monthly zometa, then every 3 months   06/01/2018 Imaging   DEXA scan showed bone density T score in femur -2.3   04/20/2019 PET scan   1. No FDG avid osseous lesions or mass identified to suggest metabolically active lesion of myeloma or plasmacytoma. 2. Small nodular density within the paravertebral right lower lobe exhibits mild to moderate increased uptake within SUV max of 3.38. This is indeterminate. Review of CT chest from 10/05/2018 shows a corresponding Lung nodule in this area measuring the same. Small pulmonary neoplasm cannot be excluded. 3. Indeterminate, focal area of increased uptake is identified within the thoracic canal. Indeterminate favored to represent benign physiologic CNS activity.     04/30/2019 -  Chemotherapy      Patient is on Antibody Plan: MYELOMA SQ DARATUMUMAB FASPRO Q28D    04/30/2019 - 07/09/2019 Chemotherapy   The patient had bortezomib SQ (VELCADE) chemo injection 1.75 mg, 1.3 mg/m2 = 1.75 mg, Subcutaneous,  Once, 10 of 11 cycles Administration: 1.75 mg (04/30/2019), 1.75 mg (05/07/2019), 1.75 mg (05/14/2019), 1.75 mg (05/21/2019), 1.75 mg (05/28/2019), 1.75 mg (06/04/2019), 1.75 mg (06/11/2019), 1.75 mg (06/18/2019), 1.75 mg (06/25/2019), 1.75 mg (07/09/2019)  for chemotherapy treatment.      REVIEW OF SYSTEMS:   Constitutional: Denies fevers, chills or abnormal weight loss Eyes: Denies blurriness of vision Ears, nose, mouth, throat, and face: Denies mucositis or sore throat Respiratory: Denies cough, dyspnea or  wheezes Cardiovascular: Denies palpitation, chest discomfort or lower extremity swelling Gastrointestinal:  Denies nausea, heartburn or change in bowel habits Skin: Denies abnormal skin rashes Lymphatics: Denies new lymphadenopathy or easy bruising Neurological:Denies numbness, tingling or new weaknesses Behavioral/Psych: Mood is stable, no new changes  All other systems were reviewed with the patient and are negative.  I have reviewed the past medical history, past surgical history, social history and family history with the patient and they are unchanged from previous note.  ALLERGIES:  is allergic to augmentin [amoxicillin-pot clavulanate], albuterol, codeine, ibuprofen, nsaids, and tolmetin.  MEDICATIONS:  Current Outpatient Medications  Medication Sig Dispense Refill  . acetaminophen (TYLENOL) 500 MG tablet Take 500-1,000 mg by mouth every 6 (six) hours as needed for mild pain, moderate pain, fever or headache.    Marland Kitchen acyclovir (ZOVIRAX) 400 MG tablet TAKE 1 TABLET BY MOUTH TWICE DAILY 180 tablet 3  . aspirin EC 81 MG tablet Take 81 mg by mouth daily with breakfast.    . calcium carbonate (OSCAL) 1500 (600 Ca) MG TABS tablet Take 1,500 mg by mouth daily with breakfast.    .  Calcium Carbonate 500 MG CHEW Chew 500 mg by mouth as needed for indigestion.    . Cholecalciferol (VITAMIN D3) 2000 units capsule Take 2,000 Units by mouth daily.     . famotidine (PEPCID) 20 MG tablet Take 20 mg by mouth daily.    . fluorometholone (FML) 0.1 % ophthalmic suspension Place 1 drop into both eyes 3 (three) times daily.    . Multiple Vitamins-Minerals (CENTRUM SILVER PO) Take 1 tablet by mouth daily.     Marland Kitchen omeprazole (PRILOSEC) 20 MG capsule Take 1 capsule (20 mg total) by mouth daily. 90 capsule 11  . ondansetron (ZOFRAN) 8 MG tablet TAKE 1 TABLET(8 MG) BY MOUTH EVERY 8 HOURS AS NEEDED FOR NAUSEA OR VOMITING 60 tablet 11  . senna-docusate (SENOKOT-S) 8.6-50 MG tablet Take 1-2 tablets by mouth daily as  needed for mild constipation or moderate constipation.     . sodium chloride (OCEAN) 0.65 % SOLN nasal spray Place 1 spray into both nostrils as needed for congestion.    . triamcinolone ointment (KENALOG) 0.1 % Apply 1 application topically daily.    Marland Kitchen triamterene-hydrochlorothiazide (MAXZIDE-25) 37.5-25 MG tablet Take 0.5 tablets by mouth daily.    . vitamin B-12 (CYANOCOBALAMIN) 500 MCG tablet Take 500 mcg by mouth daily.     No current facility-administered medications for this visit.   Facility-Administered Medications Ordered in Other Visits  Medication Dose Route Frequency Provider Last Rate Last Admin  . 0.9 %  sodium chloride infusion   Intravenous Once [name], MD        PHYSICAL EXAMINATION: ECOG PERFORMANCE STATUS: 1 - Symptomatic but completely ambulatory  Vitals:   12/02/20 0916  BP: (!) 154/79  Pulse: 79  Resp: 17  Temp: 98 F (36.7 C)  SpO2: 100%   Filed Weights   12/02/20 0916  Weight: 111 lb 9.6 oz (50.6 kg)    GENERAL:alert, no distress and comfortable SKIN: skin color, texture, turgor are normal, no rashes or significant lesions EYES: normal, Conjunctiva are pink and non-injected, sclera clear OROPHARYNX:no exudate, no erythema and lips, buccal mucosa, and tongue normal  NECK: supple, thyroid normal size, non-tender, without nodularity LYMPH:  no palpable lymphadenopathy in the cervical, axillary or inguinal LUNGS: clear to auscultation and percussion with normal breathing effort HEART: regular rate & rhythm and no murmurs and no lower extremity edema ABDOMEN:abdomen soft, non-tender and normal bowel sounds Musculoskeletal:no cyanosis of digits and no clubbing  NEURO: alert & oriented x 3 with fluent speech, no focal motor/sensory deficits  LABORATORY DATA:  I have reviewed the data as listed    Component Value Date/Time   NA 143 12/02/2020 0855   NA 143 07/14/2017 1245   K 3.5 12/02/2020 0855   K 3.3 (L) 07/14/2017 1245   CL 108 12/02/2020  0855   CO2 25 12/02/2020 0855   CO2 20 (L) 07/14/2017 1245   GLUCOSE 80 12/02/2020 0855   GLUCOSE 106 07/14/2017 1245   BUN 12 12/02/2020 0855   BUN 9.3 07/14/2017 1245   CREATININE 0.98 12/02/2020 0855   CREATININE 1.07 (H) 01/14/2020 0828   CREATININE 0.8 07/14/2017 1245   CALCIUM 9.5 12/02/2020 0855   CALCIUM 8.4 07/14/2017 1245   PROT 7.1 12/02/2020 0855   PROT 6.0 07/14/2017 1245   PROT 6.2 (L) 07/14/2017 1245   ALBUMIN 4.2 12/02/2020 0855   ALBUMIN 3.6 07/14/2017 1245   AST 21 12/02/2020 0855   AST 21 01/14/2020 0828   AST 21 07/14/2017 1245  ALT 10 12/02/2020 0855   ALT 15 01/14/2020 0828   ALT 21 07/14/2017 1245   ALKPHOS 53 12/02/2020 0855   ALKPHOS 78 07/14/2017 1245   BILITOT 0.7 12/02/2020 0855   BILITOT 0.8 01/14/2020 0828   BILITOT 0.43 07/14/2017 1245   GFRNONAA >60 12/02/2020 0855   GFRNONAA 53 (L) 01/14/2020 0828   GFRAA 57 (L) 04/21/2020 0817   GFRAA >60 01/14/2020 0828    No results found for: SPEP, UPEP  Lab Results  Component Value Date   WBC 4.6 12/02/2020   NEUTROABS 1.6 (L) 12/02/2020   HGB 11.6 (L) 12/02/2020   HCT 35.2 (L) 12/02/2020   MCV 82.2 12/02/2020   PLT 301 12/02/2020      Chemistry      Component Value Date/Time   NA 143 12/02/2020 0855   NA 143 07/14/2017 1245   K 3.5 12/02/2020 0855   K 3.3 (L) 07/14/2017 1245   CL 108 12/02/2020 0855   CO2 25 12/02/2020 0855   CO2 20 (L) 07/14/2017 1245   BUN 12 12/02/2020 0855   BUN 9.3 07/14/2017 1245   CREATININE 0.98 12/02/2020 0855   CREATININE 1.07 (H) 01/14/2020 0828   CREATININE 0.8 07/14/2017 1245      Component Value Date/Time   CALCIUM 9.5 12/02/2020 0855   CALCIUM 8.4 07/14/2017 1245   ALKPHOS 53 12/02/2020 0855   ALKPHOS 78 07/14/2017 1245   AST 21 12/02/2020 0855   AST 21 01/14/2020 0828   AST 21 07/14/2017 1245   ALT 10 12/02/2020 0855   ALT 15 01/14/2020 0828   ALT 21 07/14/2017 1245   BILITOT 0.7 12/02/2020 0855   BILITOT 0.8 01/14/2020 0828   BILITOT  0.43 07/14/2017 1245

## 2020-12-02 NOTE — Telephone Encounter (Signed)
Called and scheduled bone density at [facility] on 5/10, arrive at 0915 for 0930 appt. No calcium, vitiamin d and multiple vitamin for 2 days prior to bone density. Called and given Jacqlyn Larsen above message. She verbalized understanding to appt is aware of [place] location.

## 2020-12-02 NOTE — Assessment & Plan Note (Signed)
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  Observe  

## 2020-12-02 NOTE — Telephone Encounter (Signed)
-----   Message from [name], MD sent at 12/02/2020 10:54 AM EDT ----- Can you help schedule dexa scan? She had last one done in 2019

## 2020-12-02 NOTE — Patient Instructions (Signed)
[facility]COL[facility]  Discharge Instructions: °Thank you for choosing [facility] to provide your oncology and hematology care.  ° °If you have a lab appointment with the Cancer Center, please go directly to the Cancer Center and check in at the registration area. °  °Wear comfortable clothing and clothing appropriate for easy access to any Portacath or PICC line.  ° °We strive to give you quality time with your provider. You may need to reschedule your appointment if you arrive late (15 or more minutes).  Arriving late affects you and other patients whose appointments are after yours.  Also, if you miss three or more appointments without notifying the office, you may be dismissed from the clinic at the provider’s discretion.    °  °For prescription refill requests, have your pharmacy contact our office and allow 72 hours for refills to be completed.   ° °Today you received the following chemotherapy and/or immunotherapy agents: Darzalex  °  °To help prevent nausea and vomiting after your treatment, we encourage you to take your nausea medication as directed. ° °BELOW ARE SYMPTOMS THAT SHOULD BE REPORTED IMMEDIATELY: °*FEVER GREATER THAN 100.4 F (38 °C) OR HIGHER °*CHILLS OR SWEATING °*NAUSEA AND VOMITING THAT IS NOT CONTROLLED WITH YOUR NAUSEA MEDICATION °*UNUSUAL SHORTNESS OF BREATH °*UNUSUAL BRUISING OR BLEEDING °*URINARY PROBLEMS (pain or burning when urinating, or frequent urination) °*BOWEL PROBLEMS (unusual diarrhea, constipation, pain near the anus) °TENDERNESS IN MOUTH AND THROAT WITH OR WITHOUT PRESENCE OF ULCERS (sore throat, sores in mouth, or a toothache) °UNUSUAL RASH, SWELLING OR PAIN  °UNUSUAL VAGINAL DISCHARGE OR ITCHING  ° °Items with * indicate a potential emergency and should be followed up as soon as possible or go to the Emergency Department if any problems should occur. ° °Please show the CHEMOTHERAPY ALERT CARD or IMMUNOTHERAPY ALERT CARD at check-in to the  Emergency Department and triage nurse. ° °Should you have questions after your visit or need to cancel or reschedule your appointment, please contact [facility]  Dept: [phone]  and follow the prompts.  Office hours are 8:00 a.m. to 4:30 p.m. Monday - Friday. Please note that voicemails left after 4:00 p.m. may not be returned until the following business day.  We are closed weekends and major holidays. You have access to a nurse at all times for urgent questions. Please call the main number to the clinic Dept: [phone] and follow the prompts. ° ° °For any non-urgent questions, you may also contact your provider using MyChart. We now offer e-Visits for anyone 18 and older to request care online for non-urgent symptoms. For details visit [email or web address]. °  °Also download the MyChart app! Go to the app store, search "MyChart", open the app, select [facility], and log in with your MyChart username and password. ° °Due to Covid, a mask is required upon entering the hospital/clinic. If you do not have a mask, one will be given to you upon arrival. For doctor visits, patients may have 1 support person aged 18 or older with them. For treatment visits, patients cannot have anyone with them due to current Covid guidelines and our immunocompromised population.  ° °

## 2020-12-03 LAB — KAPPA/LAMBDA LIGHT CHAINS
Kappa free light chain: 25.1 mg/L — ABNORMAL HIGH (ref 3.3–19.4)
Kappa, lambda light chain ratio: 10.46 — ABNORMAL HIGH (ref 0.26–1.65)
Lambda free light chains: 2.4 mg/L — ABNORMAL LOW (ref 5.7–26.3)

## 2020-12-05 ENCOUNTER — Telehealth: Payer: Self-pay

## 2020-12-05 LAB — MULTIPLE MYELOMA PANEL, SERUM
Albumin SerPl Elph-Mcnc: 3.7 g/dL (ref 2.9–4.4)
Albumin/Glob SerPl: 1.5 (ref 0.7–1.7)
Alpha 1: 0.2 g/dL (ref 0.0–0.4)
Alpha2 Glob SerPl Elph-Mcnc: 0.8 g/dL (ref 0.4–1.0)
B-Globulin SerPl Elph-Mcnc: 1 g/dL (ref 0.7–1.3)
Gamma Glob SerPl Elph-Mcnc: 0.6 g/dL (ref 0.4–1.8)
Globulin, Total: 2.6 g/dL (ref 2.2–3.9)
IgA: 6 mg/dL — ABNORMAL LOW (ref 64–422)
IgG (Immunoglobin G), Serum: 684 mg/dL (ref 586–1602)
IgM (Immunoglobulin M), Srm: 12 mg/dL — ABNORMAL LOW (ref 26–217)
M Protein SerPl Elph-Mcnc: 0.4 g/dL — ABNORMAL HIGH
Total Protein ELP: 6.3 g/dL (ref 6.0–8.5)

## 2020-12-05 NOTE — Telephone Encounter (Signed)
-----   Message from [name], MD sent at 12/05/2020  1:39 PM EDT ----- Pls let her know myeloma panel is stable

## 2020-12-05 NOTE — Telephone Encounter (Signed)
Patient notified, no further needs.

## 2020-12-25 ENCOUNTER — Telehealth: Payer: Self-pay

## 2020-12-25 NOTE — Telephone Encounter (Signed)
Called per Dr. Alvy Bimler. Bone Density better. She verbalized understanding.

## 2020-12-31 ENCOUNTER — Inpatient Hospital Stay (HOSPITAL_BASED_OUTPATIENT_CLINIC_OR_DEPARTMENT_OTHER): Payer: Medicare Other | Admitting: Hematology and Oncology

## 2020-12-31 ENCOUNTER — Inpatient Hospital Stay: Payer: Medicare Other | Attending: Hematology and Oncology

## 2020-12-31 ENCOUNTER — Other Ambulatory Visit: Payer: Self-pay

## 2020-12-31 ENCOUNTER — Encounter: Payer: Self-pay | Admitting: Hematology and Oncology

## 2020-12-31 ENCOUNTER — Inpatient Hospital Stay: Payer: Medicare Other

## 2020-12-31 DIAGNOSIS — C9002 Multiple myeloma in relapse: Secondary | ICD-10-CM

## 2020-12-31 DIAGNOSIS — J984 Other disorders of lung: Secondary | ICD-10-CM | POA: Diagnosis not present

## 2020-12-31 DIAGNOSIS — D6481 Anemia due to antineoplastic chemotherapy: Secondary | ICD-10-CM | POA: Insufficient documentation

## 2020-12-31 DIAGNOSIS — C9001 Multiple myeloma in remission: Secondary | ICD-10-CM

## 2020-12-31 DIAGNOSIS — Z5112 Encounter for antineoplastic immunotherapy: Secondary | ICD-10-CM | POA: Insufficient documentation

## 2020-12-31 DIAGNOSIS — Z79899 Other long term (current) drug therapy: Secondary | ICD-10-CM | POA: Insufficient documentation

## 2020-12-31 DIAGNOSIS — Z7982 Long term (current) use of aspirin: Secondary | ICD-10-CM | POA: Diagnosis not present

## 2020-12-31 DIAGNOSIS — D638 Anemia in other chronic diseases classified elsewhere: Secondary | ICD-10-CM | POA: Diagnosis not present

## 2020-12-31 DIAGNOSIS — N183 Chronic kidney disease, stage 3 unspecified: Secondary | ICD-10-CM | POA: Diagnosis not present

## 2020-12-31 DIAGNOSIS — Z9484 Stem cells transplant status: Secondary | ICD-10-CM | POA: Insufficient documentation

## 2020-12-31 DIAGNOSIS — Z7189 Other specified counseling: Secondary | ICD-10-CM

## 2020-12-31 DIAGNOSIS — R110 Nausea: Secondary | ICD-10-CM

## 2020-12-31 DIAGNOSIS — R112 Nausea with vomiting, unspecified: Secondary | ICD-10-CM | POA: Insufficient documentation

## 2020-12-31 LAB — CBC WITH DIFFERENTIAL/PLATELET
Abs Immature Granulocytes: 0.01 10*3/uL (ref 0.00–0.07)
Basophils Absolute: 0.1 10*3/uL (ref 0.0–0.1)
Basophils Relative: 1 %
Eosinophils Absolute: 0.2 10*3/uL (ref 0.0–0.5)
Eosinophils Relative: 3 %
HCT: 34.5 % — ABNORMAL LOW (ref 36.0–46.0)
Hemoglobin: 11.5 g/dL — ABNORMAL LOW (ref 12.0–15.0)
Immature Granulocytes: 0 %
Lymphocytes Relative: 47 %
Lymphs Abs: 2.3 10*3/uL (ref 0.7–4.0)
MCH: 27.3 pg (ref 26.0–34.0)
MCHC: 33.3 g/dL (ref 30.0–36.0)
MCV: 81.9 fL (ref 80.0–100.0)
Monocytes Absolute: 0.5 10*3/uL (ref 0.1–1.0)
Monocytes Relative: 11 %
Neutro Abs: 1.9 10*3/uL (ref 1.7–7.7)
Neutrophils Relative %: 38 %
Platelets: 313 10*3/uL (ref 150–400)
RBC: 4.21 MIL/uL (ref 3.87–5.11)
RDW: 14.6 % (ref 11.5–15.5)
WBC: 5 10*3/uL (ref 4.0–10.5)
nRBC: 0 % (ref 0.0–0.2)

## 2020-12-31 LAB — COMPREHENSIVE METABOLIC PANEL
ALT: 9 U/L (ref 0–44)
AST: 19 U/L (ref 15–41)
Albumin: 4 g/dL (ref 3.5–5.0)
Alkaline Phosphatase: 58 U/L (ref 38–126)
Anion gap: 9 (ref 5–15)
BUN: 11 mg/dL (ref 8–23)
CO2: 25 mmol/L (ref 22–32)
Calcium: 9.6 mg/dL (ref 8.9–10.3)
Chloride: 106 mmol/L (ref 98–111)
Creatinine, Ser: 1.04 mg/dL — ABNORMAL HIGH (ref 0.44–1.00)
GFR, Estimated: 57 mL/min — ABNORMAL LOW (ref >60)
Glucose, Bld: 80 mg/dL (ref 70–99)
Potassium: 3.9 mmol/L (ref 3.5–5.1)
Sodium: 140 mmol/L (ref 135–145)
Total Bilirubin: 0.7 mg/dL (ref 0.3–1.2)
Total Protein: 6.9 g/dL (ref 6.5–8.1)

## 2020-12-31 MED ORDER — SODIUM CHLORIDE 0.9 % IV SOLN
Freq: Once | INTRAVENOUS | Status: DC
  Filled 2020-12-31: qty 250

## 2020-12-31 MED ORDER — MONTELUKAST SODIUM 10 MG PO TABS
ORAL_TABLET | ORAL | Status: AC
  Filled 2020-12-31: qty 1

## 2020-12-31 MED ORDER — ACETAMINOPHEN 325 MG PO TABS
ORAL_TABLET | ORAL | Status: AC
  Filled 2020-12-31: qty 2

## 2020-12-31 MED ORDER — DEXAMETHASONE 4 MG PO TABS
ORAL_TABLET | ORAL | Status: AC
  Filled 2020-12-31: qty 2

## 2020-12-31 MED ORDER — MONTELUKAST SODIUM 10 MG PO TABS
10.0000 mg | ORAL_TABLET | Freq: Once | ORAL | Status: AC
  Administered 2020-12-31: 10 mg via ORAL

## 2020-12-31 MED ORDER — DEXAMETHASONE 4 MG PO TABS
8.0000 mg | ORAL_TABLET | Freq: Once | ORAL | Status: AC
Start: 2020-12-31 — End: 2020-12-31
  Administered 2020-12-31: 8 mg via ORAL

## 2020-12-31 MED ORDER — ACETAMINOPHEN 325 MG PO TABS
650.0000 mg | ORAL_TABLET | Freq: Once | ORAL | Status: AC
  Administered 2020-12-31: 650 mg via ORAL

## 2020-12-31 MED ORDER — DARATUMUMAB-HYALURONIDASE-FIHJ 1800-30000 MG-UT/15ML ~~LOC~~ SOLN
1800.0000 mg | Freq: Once | SUBCUTANEOUS | Status: AC
  Administered 2020-12-31: 1800 mg via SUBCUTANEOUS
  Filled 2020-12-31: qty 15

## 2020-12-31 NOTE — Assessment & Plan Note (Signed)
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  Observe

## 2020-12-31 NOTE — Patient Instructions (Signed)
[facility] Discharge Instructions for Patients Receiving Chemotherapy  Today you received the following chemotherapy agent: Daratumumab (Darzalex Faspro)  To help prevent nausea and vomiting after your treatment, we encourage you to take your nausea medication as directed by your MD.   If you develop nausea and vomiting that is not controlled by your nausea medication, call the clinic.   BELOW ARE SYMPTOMS THAT SHOULD BE REPORTED IMMEDIATELY:  *FEVER GREATER THAN 100.5 F  *CHILLS WITH OR WITHOUT FEVER  NAUSEA AND VOMITING THAT IS NOT CONTROLLED WITH YOUR NAUSEA MEDICATION  *UNUSUAL SHORTNESS OF BREATH  *UNUSUAL BRUISING OR BLEEDING  TENDERNESS IN MOUTH AND THROAT WITH OR WITHOUT PRESENCE OF ULCERS  *URINARY PROBLEMS  *BOWEL PROBLEMS  UNUSUAL RASH Items with * indicate a potential emergency and should be followed up as soon as possible.  Feel free to call the clinic should you have any questions or concerns. The clinic phone number is [phone].  Please show the [facility] at check-in to the Emergency Department and triage nurse.

## 2020-12-31 NOTE — Assessment & Plan Note (Signed)
She does not need dose adjustment for [employer]

## 2020-12-31 NOTE — Assessment & Plan Note (Signed)
The cause is unknown She has prescription antiemetics to take as needed It is not common to see hospital caused by her treatment I recommend consideration for GI referral

## 2020-12-31 NOTE — Assessment & Plan Note (Signed)
I have reviewed recent myeloma panel with the patient  I have noticed the fluctuation M protein and light chains are slightly trending up and down She is not symptomatic for now, I recommend we continue on Faspro maintenance treatment We will continue myeloma panel once a month If her M protein start to trend higher than 0.5, we will start myeloma work-up again and consider adding additional therapy She agreed with the plan of care I have received her bone density scan results which looks good I plan to space out Zometa every 6 months, next will be due in August She will continue calcium with vitamin D supplement

## 2020-12-31 NOTE — Progress Notes (Signed)
HEMATOLOGY-ONCOLOGY ELECTRONIC VISIT PROGRESS NOTE  Patient Care Team: Josetta Huddle, MD as PCP - General (Internal Medicine) Ginette Pitman, MD as Consulting Physician (Hematology and Oncology) Hessie Dibble, MD as Consulting Physician (Hematology and Oncology)  I connected with  the patient via telephone call visit  ASSESSMENT & PLAN:  Multiple myeloma in relapse [facility]) I have reviewed recent myeloma panel with the patient  I have noticed the fluctuation M protein and light chains are slightly trending up and down She is not symptomatic for now, I recommend we continue on Faspro maintenance treatment We will continue myeloma panel once a month If her M protein start to trend higher than 0.5, we will start myeloma work-up again and consider adding additional therapy She agreed with the plan of care I have received her bone density scan results which looks good I plan to space out Zometa every 6 months, next will be due in August She will continue calcium with vitamin D supplement  Chronic kidney disease, stage III (moderate) She does not need dose adjustment for Faspro   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 She has multifactorial anemia, 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  Observe   Chronic nausea The cause is unknown She has prescription antiemetics to take as needed It is not common to see hospital caused by her treatment I recommend consideration for GI referral   No orders of the defined types were placed in this encounter.   INTERVAL HISTORY: Please see below for problem oriented charting. She returns to the cancer center for treatment She is here accompanied by her husband She is doing well Her only symptom is mild intermittent nausea She denies reflux symptoms Denies recent constipation No new bone pain  SUMMARY OF ONCOLOGIC HISTORY: Oncology History Overview Note   M-protein  0.69 gm/dl IFIX - IgG, Kappa IgG - 868 IgA - 19 IgM - < 20 Kappa - 21 Lambda - 5.7  09/06/2014 - Bone marrow aspirate and biopsy:   Normocellular marrow for age (40%) with a small monoclonal plasma cell population (1% on aspirate). Karyotype 80, XX  FISH Negative for myeloma associated changes  09/12/2014 - PET/CT  Two regions that are concerning for disease, one adjacent/involving the left ninth rib and one in the marrow of the right femur, in this patient with history of plasmacytoma.    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  04/01/2015 Bone Marrow Transplant   She received melphalan chemotherapy on 03/31/2015 followed by autologous stem cell transplant the day after   04/03/2015 - 04/18/2015 Hospital Admission   The patient was admitted to the hospital at [place] for management related to complication from stem cell transplant. She had significant nausea requiring intravenous anti-emetics.   07/17/2015 -  Chemotherapy   She started  maintenance Revlimid and monthly zometa, then every 3 months   06/01/2018 Imaging   DEXA scan showed bone density T score in femur -2.3   04/20/2019 PET scan   1. No FDG avid osseous lesions or mass identified to suggest metabolically active lesion of myeloma or plasmacytoma. 2. Small nodular density within the paravertebral right lower lobe exhibits mild to moderate increased uptake within SUV max of 3.38. This is indeterminate. Review of CT chest from 10/05/2018 shows a corresponding Lung nodule in this area measuring the same. Small pulmonary neoplasm cannot be excluded. 3. Indeterminate, focal area of increased uptake is identified within the thoracic canal. Indeterminate favored to represent benign physiologic CNS activity.     04/30/2019 -  Chemotherapy      Patient is on Antibody Plan: MYELOMA SQ DARATUMUMAB FASPRO Q28D    04/30/2019 - 07/09/2019 Chemotherapy   The patient had bortezomib SQ (VELCADE) chemo injection 1.75 mg, 1.3 mg/m2 = 1.75 mg, Subcutaneous,  Once, 10 of 11 cycles Administration: 1.75 mg (04/30/2019), 1.75 mg (05/07/2019), 1.75 mg (05/14/2019), 1.75 mg (05/21/2019), 1.75 mg (05/28/2019), 1.75 mg (06/04/2019), 1.75 mg (06/11/2019), 1.75 mg (06/18/2019), 1.75 mg (06/25/2019), 1.75 mg (07/09/2019)  for chemotherapy treatment.      REVIEW OF SYSTEMS:   Constitutional: Denies fevers, chills or abnormal weight loss Eyes: Denies blurriness of vision Ears, nose, mouth, throat, and face: Denies mucositis or sore throat Respiratory: Denies cough, dyspnea or wheezes Cardiovascular: Denies palpitation, chest discomfort Gastrointestinal:  Denies nausea, heartburn or change in bowel habits Skin: Denies abnormal skin rashes Lymphatics: Denies new lymphadenopathy or easy bruising Neurological:Denies numbness, tingling or new weaknesses Behavioral/Psych: Mood is stable, no new changes  Extremities: No lower extremity edema All other systems were reviewed with the patient and are  negative.  I have reviewed the past medical history, past surgical history, social history and family history with the patient and they are unchanged from previous note.  ALLERGIES:  is allergic to augmentin [amoxicillin-pot clavulanate], albuterol, codeine, ibuprofen, nsaids, and tolmetin.  MEDICATIONS:  Current Outpatient Medications  Medication Sig Dispense Refill  . acetaminophen (TYLENOL) 500 MG tablet Take 500-1,000 mg by mouth every 6 (six) hours as needed for mild pain, moderate pain, fever or headache.    Marland Kitchen acyclovir (ZOVIRAX) 400 MG tablet TAKE 1 TABLET BY MOUTH TWICE DAILY 180 tablet 3  . aspirin EC 81 MG tablet Take 81 mg by mouth daily with breakfast.    . calcium carbonate (OSCAL) 1500 (600 Ca) MG TABS tablet Take 1,500 mg by mouth daily with breakfast.    . Calcium Carbonate 500 MG CHEW Chew 500 mg by mouth as needed for indigestion.    . Cholecalciferol (VITAMIN D3) 2000 units capsule Take 2,000 Units by mouth daily.     . famotidine (PEPCID) 20 MG tablet Take 20 mg by mouth daily.    . fluorometholone (FML) 0.1 % ophthalmic suspension Place 1 drop into both eyes 3 (three) times daily.    . Multiple Vitamins-Minerals (CENTRUM SILVER PO) Take 1 tablet by mouth daily.     Marland Kitchen omeprazole (PRILOSEC) 20 MG capsule  Take 1 capsule (20 mg total) by mouth daily. 90 capsule 11  . ondansetron (ZOFRAN) 8 MG tablet TAKE 1 TABLET(8 MG) BY MOUTH EVERY 8 HOURS AS NEEDED FOR NAUSEA OR VOMITING 60 tablet 11  . senna-docusate (SENOKOT-S) 8.6-50 MG tablet Take 1-2 tablets by mouth daily as needed for mild constipation or moderate constipation.     . sodium chloride (OCEAN) 0.65 % SOLN nasal spray Place 1 spray into both nostrils as needed for congestion.    . triamcinolone ointment (KENALOG) 0.1 % Apply 1 application topically daily.    Marland Kitchen triamterene-hydrochlorothiazide (MAXZIDE-25) 37.5-25 MG tablet Take 0.5 tablets by mouth daily.    . vitamin B-12 (CYANOCOBALAMIN) 500 MCG tablet Take 500 mcg by  mouth daily.     No current facility-administered medications for this visit.   Facility-Administered Medications Ordered in Other Visits  Medication Dose Route Frequency Provider Last Rate Last Admin  . 0.9 %  sodium chloride infusion   Intravenous Once Sophiea Ueda, MD      . 0.9 %  sodium chloride infusion   Intravenous Once Alvy Bimler, Breeze Angell, MD      . daratumumab-hyaluronidase-fihj (DARZALEX FASPRO) 1800-30000 MG-UT/15ML chemo SQ injection 1,800 mg  1,800 mg Subcutaneous Once Alvy Bimler, Bonita Brindisi, MD        PHYSICAL EXAMINATION: ECOG PERFORMANCE STATUS: 1 - Symptomatic but completely ambulatory  LABORATORY DATA:  I have reviewed the data as listed CMP Latest Ref Rng & Units 12/31/2020 12/02/2020 11/04/2020  Glucose 70 - 99 mg/dL 80 80 78  BUN 8 - 23 mg/dL _0 Creatinine 0.44 - 1.00 mg/dL 1.04(H) 0.98 0.87  Sodium 135 - 145 mmol/L 140 143 144  Potassium 3.5 - 5.1 mmol/L 3.9 3.5 3.3(L)  Chloride 98 - 111 mmol/L 106 108 109  CO2 22 - 32 mmol/L _1 Calcium 8.9 - 10.3 mg/dL 9.6 9.5 8.8(L)  Total Protein 6.5 - 8.1 g/dL 6.9 7.1 7.2  Total Bilirubin 0.3 - 1.2 mg/dL 0.7 0.7 0.6  Alkaline Phos 38 - 126 U/L 58 53 59  AST 15 - 41 U/L _2 ALT 0 - 44 U/L _3 Lab Results  Component Value Date   WBC 5.0 12/31/2020   HGB 11.5 (L) 12/31/2020   HCT 34.5 (L) 12/31/2020   MCV 81.9 12/31/2020   PLT 313 12/31/2020   NEUTROABS 1.9 12/31/2020    I discussed the assessment and treatment plan with the patient. The patient was provided an opportunity to ask questions and all were answered. The patient agreed with the plan and demonstrated an understanding of the instructions. The patient was advised to call back or seek an in-person evaluation if the symptoms worsen or if the condition fails to improve as anticipated.    I spent 20 minutes for the appointment reviewing test results, discuss management and coordination of care.  [name], MD 12/31/2020 9:57 AM

## 2021-01-01 LAB — KAPPA/LAMBDA LIGHT CHAINS
Kappa free light chain: 30 mg/L — ABNORMAL HIGH (ref 3.3–19.4)
Kappa, lambda light chain ratio: 14.29 — ABNORMAL HIGH (ref 0.26–1.65)
Lambda free light chains: 2.1 mg/L — ABNORMAL LOW (ref 5.7–26.3)

## 2021-01-07 ENCOUNTER — Telehealth: Payer: Self-pay

## 2021-01-07 LAB — MULTIPLE MYELOMA PANEL, SERUM
Albumin SerPl Elph-Mcnc: 3.9 g/dL (ref 2.9–4.4)
Albumin/Glob SerPl: 1.4 (ref 0.7–1.7)
Alpha 1: 0.2 g/dL (ref 0.0–0.4)
Alpha2 Glob SerPl Elph-Mcnc: 0.8 g/dL (ref 0.4–1.0)
B-Globulin SerPl Elph-Mcnc: 1.1 g/dL (ref 0.7–1.3)
Gamma Glob SerPl Elph-Mcnc: 0.7 g/dL (ref 0.4–1.8)
Globulin, Total: 2.8 g/dL (ref 2.2–3.9)
IgA: 7 mg/dL — ABNORMAL LOW (ref 64–422)
IgG (Immunoglobin G), Serum: 792 mg/dL (ref 586–1602)
IgM (Immunoglobulin M), Srm: 6 mg/dL — ABNORMAL LOW (ref 26–217)
M Protein SerPl Elph-Mcnc: 0.6 g/dL — ABNORMAL HIGH
Total Protein ELP: 6.7 g/dL (ref 6.0–8.5)

## 2021-01-07 NOTE — Telephone Encounter (Signed)
-----   Message from [name], MD sent at 01/07/2021  2:50 PM EDT ----- Pls call and let her know M protein is a bit high No need to change treatment yet If next set of M protein is the same, we will order more tests

## 2021-01-07 NOTE — Telephone Encounter (Signed)
Called and left a message asking her to call the office back. 

## 2021-01-08 ENCOUNTER — Telehealth: Payer: Self-pay

## 2021-01-08 ENCOUNTER — Other Ambulatory Visit: Payer: Self-pay | Admitting: Hematology and Oncology

## 2021-01-08 ENCOUNTER — Encounter: Payer: Self-pay | Admitting: Hematology and Oncology

## 2021-01-08 NOTE — Telephone Encounter (Signed)
OK, I will look for it

## 2021-01-08 NOTE — Telephone Encounter (Signed)
Called per recent labs faxed over from PCP. Per Dr. Alvy Bimler, instructed to start B12 500 mcg or 1000 mcg oral daily and she will get B12 injection at next appt. She verbalized understanding.

## 2021-01-08 NOTE — Telephone Encounter (Signed)
Called and given below message. She verbalized understanding. She saw her PCP recently and had b12 checked. It was low and now they want her to take b12 injections.  She will get the PCP to fax lab results to Dr. Alvy Bimler. She is asking for your opinion. She does not want to go to PCP office for injections.

## 2021-01-27 ENCOUNTER — Inpatient Hospital Stay: Payer: Medicare Other

## 2021-01-27 ENCOUNTER — Other Ambulatory Visit: Payer: Self-pay

## 2021-01-27 ENCOUNTER — Inpatient Hospital Stay: Payer: Medicare Other | Attending: Hematology and Oncology

## 2021-01-27 ENCOUNTER — Inpatient Hospital Stay (HOSPITAL_BASED_OUTPATIENT_CLINIC_OR_DEPARTMENT_OTHER): Payer: Medicare Other | Admitting: Hematology and Oncology

## 2021-01-27 ENCOUNTER — Encounter: Payer: Self-pay | Admitting: Hematology and Oncology

## 2021-01-27 ENCOUNTER — Telehealth: Payer: Self-pay | Admitting: Hematology and Oncology

## 2021-01-27 DIAGNOSIS — Z5112 Encounter for antineoplastic immunotherapy: Secondary | ICD-10-CM | POA: Diagnosis present

## 2021-01-27 DIAGNOSIS — Z886 Allergy status to analgesic agent status: Secondary | ICD-10-CM | POA: Diagnosis not present

## 2021-01-27 DIAGNOSIS — Z79899 Other long term (current) drug therapy: Secondary | ICD-10-CM | POA: Diagnosis not present

## 2021-01-27 DIAGNOSIS — I10 Essential (primary) hypertension: Secondary | ICD-10-CM | POA: Insufficient documentation

## 2021-01-27 DIAGNOSIS — Z881 Allergy status to other antibiotic agents status: Secondary | ICD-10-CM | POA: Insufficient documentation

## 2021-01-27 DIAGNOSIS — Z9481 Bone marrow transplant status: Secondary | ICD-10-CM

## 2021-01-27 DIAGNOSIS — Z7189 Other specified counseling: Secondary | ICD-10-CM

## 2021-01-27 DIAGNOSIS — J984 Other disorders of lung: Secondary | ICD-10-CM | POA: Diagnosis not present

## 2021-01-27 DIAGNOSIS — E538 Deficiency of other specified B group vitamins: Secondary | ICD-10-CM | POA: Diagnosis not present

## 2021-01-27 DIAGNOSIS — Z9484 Stem cells transplant status: Secondary | ICD-10-CM | POA: Diagnosis not present

## 2021-01-27 DIAGNOSIS — C9002 Multiple myeloma in relapse: Secondary | ICD-10-CM | POA: Diagnosis present

## 2021-01-27 DIAGNOSIS — C9001 Multiple myeloma in remission: Secondary | ICD-10-CM

## 2021-01-27 DIAGNOSIS — Z885 Allergy status to narcotic agent status: Secondary | ICD-10-CM | POA: Diagnosis not present

## 2021-01-27 DIAGNOSIS — Z880 Allergy status to penicillin: Secondary | ICD-10-CM | POA: Insufficient documentation

## 2021-01-27 LAB — CBC WITH DIFFERENTIAL/PLATELET
Abs Immature Granulocytes: 0.01 10*3/uL (ref 0.00–0.07)
Basophils Absolute: 0 10*3/uL (ref 0.0–0.1)
Basophils Relative: 1 %
Eosinophils Absolute: 0.2 10*3/uL (ref 0.0–0.5)
Eosinophils Relative: 4 %
HCT: 34.2 % — ABNORMAL LOW (ref 36.0–46.0)
Hemoglobin: 11.1 g/dL — ABNORMAL LOW (ref 12.0–15.0)
Immature Granulocytes: 0 %
Lymphocytes Relative: 53 %
Lymphs Abs: 2.2 10*3/uL (ref 0.7–4.0)
MCH: 27.1 pg (ref 26.0–34.0)
MCHC: 32.5 g/dL (ref 30.0–36.0)
MCV: 83.6 fL (ref 80.0–100.0)
Monocytes Absolute: 0.5 10*3/uL (ref 0.1–1.0)
Monocytes Relative: 11 %
Neutro Abs: 1.3 10*3/uL — ABNORMAL LOW (ref 1.7–7.7)
Neutrophils Relative %: 31 %
Platelets: 301 10*3/uL (ref 150–400)
RBC: 4.09 MIL/uL (ref 3.87–5.11)
RDW: 14.1 % (ref 11.5–15.5)
WBC: 4.2 10*3/uL (ref 4.0–10.5)
nRBC: 0 % (ref 0.0–0.2)

## 2021-01-27 LAB — CMP (CANCER CENTER ONLY)
ALT: 14 U/L (ref 0–44)
AST: 21 U/L (ref 15–41)
Albumin: 4.2 g/dL (ref 3.5–5.0)
Alkaline Phosphatase: 50 U/L (ref 38–126)
Anion gap: 5 (ref 5–15)
BUN: 12 mg/dL (ref 8–23)
CO2: 25 mmol/L (ref 22–32)
Calcium: 9.2 mg/dL (ref 8.9–10.3)
Chloride: 111 mmol/L (ref 98–111)
Creatinine: 1.01 mg/dL — ABNORMAL HIGH (ref 0.44–1.00)
GFR, Estimated: 60 mL/min — ABNORMAL LOW (ref >60)
Glucose, Bld: 79 mg/dL (ref 70–99)
Potassium: 3.7 mmol/L (ref 3.5–5.1)
Sodium: 141 mmol/L (ref 135–145)
Total Bilirubin: 0.6 mg/dL (ref 0.3–1.2)
Total Protein: 7.2 g/dL (ref 6.5–8.1)

## 2021-01-27 MED ORDER — DARATUMUMAB-HYALURONIDASE-FIHJ 1800-30000 MG-UT/15ML ~~LOC~~ SOLN
1800.0000 mg | Freq: Once | SUBCUTANEOUS | Status: AC
  Administered 2021-01-27: 1800 mg via SUBCUTANEOUS
  Filled 2021-01-27: qty 15

## 2021-01-27 MED ORDER — ACETAMINOPHEN 325 MG PO TABS
ORAL_TABLET | ORAL | Status: AC
  Filled 2021-01-27: qty 2

## 2021-01-27 MED ORDER — MONTELUKAST SODIUM 10 MG PO TABS
10.0000 mg | ORAL_TABLET | Freq: Once | ORAL | Status: AC
  Administered 2021-01-27: 10 mg via ORAL

## 2021-01-27 MED ORDER — DEXAMETHASONE 4 MG PO TABS
8.0000 mg | ORAL_TABLET | Freq: Once | ORAL | Status: AC
  Administered 2021-01-27: 8 mg via ORAL

## 2021-01-27 MED ORDER — CYANOCOBALAMIN 1000 MCG/ML IJ SOLN
1000.0000 ug | Freq: Once | INTRAMUSCULAR | Status: AC
  Administered 2021-01-27: 1000 ug via INTRAMUSCULAR

## 2021-01-27 MED ORDER — CYANOCOBALAMIN 1000 MCG/ML IJ SOLN
INTRAMUSCULAR | Status: AC
  Filled 2021-01-27: qty 1

## 2021-01-27 MED ORDER — ACETAMINOPHEN 325 MG PO TABS
650.0000 mg | ORAL_TABLET | Freq: Once | ORAL | Status: AC
  Administered 2021-01-27: 650 mg via ORAL

## 2021-01-27 MED ORDER — MONTELUKAST SODIUM 10 MG PO TABS
ORAL_TABLET | ORAL | Status: AC
  Filled 2021-01-27: qty 1

## 2021-01-27 MED ORDER — DEXAMETHASONE 4 MG PO TABS
ORAL_TABLET | ORAL | Status: AC
  Filled 2021-01-27: qty 2

## 2021-01-27 NOTE — Assessment & Plan Note (Signed)
Her blood pressure is elevated today, likely attributed to anxiety We will proceed with treatment without delay

## 2021-01-27 NOTE — Progress Notes (Signed)
[facility] OFFICE PROGRESS NOTE  Patient Care Team: Josetta Huddle, MD as PCP - General (Internal Medicine) Ginette Pitman, MD as Consulting Physician (Hematology and Oncology) Hessie Dibble, MD as Consulting Physician (Hematology and Oncology)  ASSESSMENT & PLAN:  Multiple myeloma in relapse [facility]) I have reviewed recent myeloma panel with the patient  I have noticed the fluctuation M protein and light chains are slightly trending up and down She is not symptomatic for now, I recommend we continue on Faspro maintenance treatment We will continue myeloma panel once a month If her M protein start to trend higher than 0.5, we will start myeloma work-up again and consider adding additional therapy She agreed with the plan of care I have received her bone density scan results which looks good I plan to space out Zometa every 6 months, next will be due in August She will continue calcium with vitamin D supplement  Vitamin B12 deficiency I have reviewed lab results from her primary care doctor which show she has significant vitamin B12 deficiency The patient does not want to get B12 injection through her primary care doctor I have arranged for her to get B12 injection while she is receiving treatment here once a month and she is in agreement  Essential hypertension Her blood pressure is elevated today, likely attributed to anxiety We will proceed with treatment without delay  Orders Placed This Encounter  Procedures   CMP ([place] only)    All questions were answered. The patient knows to call the clinic with any problems, questions or concerns. The total time spent in the appointment was 20 minutes encounter with patients including review of chart and various tests results, discussions about plan of care and coordination of care plan   [name], MD 01/27/2021 10:47 AM  INTERVAL HISTORY: Please see below for problem oriented charting. She returns for  treatment follow-up She is doing well She stated her blood pressure at home is typically better She have questions regarding B12 injections She have no recent bone pain or dental issues No recent infection or infusion reaction  SUMMARY OF ONCOLOGIC HISTORY: Oncology History Overview Note   M-protein 0.69 gm/dl IFIX - IgG, Kappa IgG - 868 IgA - 19 IgM - < 20 Kappa - 21 Lambda - 5.7  09/06/2014 - Bone marrow aspirate and biopsy:   Normocellular marrow for age (40%) with a small monoclonal plasma cell population (1% on aspirate). Karyotype 27, XX  FISH Negative for myeloma associated changes  09/12/2014 - PET/CT  Two regions that are concerning for disease, one adjacent/involving the left ninth rib and one in the marrow of the right femur, in this patient with history of plasmacytoma.    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    04/01/2015 Bone Marrow Transplant   She received melphalan chemotherapy on 03/31/2015 followed by  autologous stem cell transplant the day after    04/03/2015 - 04/18/2015 Hospital Admission   The patient was admitted to the hospital at [place] for management related to complication from stem cell transplant. She had significant nausea requiring intravenous anti-emetics.    07/17/2015 -  Chemotherapy   She started maintenance Revlimid and monthly zometa, then every 3 months   06/01/2018 Imaging   DEXA scan showed bone density T score in femur -2.3    04/20/2019 PET scan   1. No FDG avid osseous lesions or mass identified to suggest metabolically active lesion of myeloma or plasmacytoma. 2. Small nodular density within the paravertebral right lower lobe exhibits mild to moderate increased uptake within SUV max of 3.38. This is indeterminate. Review of CT chest from 10/05/2018 shows a corresponding Lung nodule in this area measuring the same. Small pulmonary neoplasm cannot be excluded. 3. Indeterminate, focal area of increased uptake is identified within the thoracic canal. Indeterminate favored to represent benign physiologic CNS activity.     04/30/2019 -  Chemotherapy      Patient is on Antibody Plan: MYELOMA SQ DARATUMUMAB FASPRO Q28D     04/30/2019 - 07/09/2019 Chemotherapy   The patient had bortezomib SQ (VELCADE) chemo injection 1.75 mg, 1.3 mg/m2 = 1.75 mg, Subcutaneous,  Once, 10 of 11 cycles Administration: 1.75 mg (04/30/2019), 1.75 mg (05/07/2019), 1.75 mg (05/14/2019), 1.75 mg (05/21/2019), 1.75 mg (05/28/2019), 1.75 mg (06/04/2019), 1.75 mg (06/11/2019), 1.75 mg (06/18/2019), 1.75 mg (06/25/2019), 1.75 mg (07/09/2019)   for chemotherapy treatment.       REVIEW OF SYSTEMS:   Constitutional: Denies fevers, chills or abnormal weight loss Eyes: Denies blurriness of vision Ears, nose, mouth, throat, and face: Denies mucositis or sore throat Respiratory: Denies cough, dyspnea or wheezes Cardiovascular: Denies palpitation, chest discomfort or lower extremity  swelling Gastrointestinal:  Denies nausea, heartburn or change in bowel habits Skin: Denies abnormal skin rashes Lymphatics: Denies new lymphadenopathy or easy bruising Neurological:Denies numbness, tingling or new weaknesses Behavioral/Psych: Mood is stable, no new changes  All other systems were reviewed with the patient and are negative.  I have reviewed the past medical history, past surgical history, social history and family history with the patient and they are unchanged from previous note.  ALLERGIES:  is allergic to augmentin [amoxicillin-pot clavulanate], albuterol, codeine, ibuprofen, nsaids, and tolmetin.  MEDICATIONS:  Current Outpatient Medications  Medication Sig Dispense Refill   acetaminophen (TYLENOL) 500 MG tablet Take 500-1,000 mg by mouth every 6 (six) hours as needed for mild pain, moderate pain, fever or headache.     acyclovir (ZOVIRAX) 400 MG tablet TAKE 1 TABLET BY MOUTH TWICE DAILY 180 tablet 3   aspirin EC 81 MG tablet Take 81 mg by mouth daily with breakfast.     calcium carbonate (OSCAL) 1500 (600 Ca) MG TABS tablet Take 1,500 mg by mouth daily with breakfast.     Calcium Carbonate 500 MG CHEW Chew 500 mg by mouth as needed for indigestion.     Cholecalciferol (VITAMIN D3) 2000 units capsule Take  2,000 Units by mouth daily.      famotidine (PEPCID) 20 MG tablet Take 20 mg by mouth daily.     fluorometholone (FML) 0.1 % ophthalmic suspension Place 1 drop into both eyes 3 (three) times daily.     Multiple Vitamins-Minerals (CENTRUM SILVER PO) Take 1 tablet by mouth daily.      omeprazole (PRILOSEC) 20 MG capsule Take 1 capsule (20 mg total) by mouth daily. 90 capsule 11   ondansetron (ZOFRAN) 8 MG tablet TAKE 1 TABLET(8 MG) BY MOUTH EVERY 8 HOURS AS NEEDED FOR NAUSEA OR VOMITING 60 tablet 11   senna-docusate (SENOKOT-S) 8.6-50 MG tablet Take 1-2 tablets by mouth daily as needed for mild constipation or moderate constipation.      sodium chloride (OCEAN) 0.65 %  SOLN nasal spray Place 1 spray into both nostrils as needed for congestion.     triamcinolone ointment (KENALOG) 0.1 % Apply 1 application topically daily.     triamterene-hydrochlorothiazide (MAXZIDE-25) 37.5-25 MG tablet Take 0.5 tablets by mouth daily.     vitamin B-12 (CYANOCOBALAMIN) 500 MCG tablet Take 500 mcg by mouth daily.     No current facility-administered medications for this visit.   Facility-Administered Medications Ordered in Other Visits  Medication Dose Route Frequency Provider Last Rate Last Admin   0.9 %  sodium chloride infusion   Intravenous Once Alvy Bimler, Varnell Orvis, MD       daratumumab-hyaluronidase-fihj (DARZALEX FASPRO) 1800-30000 MG-UT/15ML chemo SQ injection 1,800 mg  1,800 mg Subcutaneous Once Alvy Bimler, Bowie Delia, MD        PHYSICAL EXAMINATION: ECOG PERFORMANCE STATUS: 1 - Symptomatic but completely ambulatory  Vitals:   01/27/21 0902  BP: (!) 157/89  Pulse: 78  Resp: 17  Temp: 97.8 F (36.6 C)  SpO2: 100%   Filed Weights   01/27/21 0902  Weight: 110 lb 6.4 oz (50.1 kg)    GENERAL:alert, no distress and comfortable SKIN: skin color, texture, turgor are normal, no rashes or significant lesions EYES: normal, Conjunctiva are pink and non-injected, sclera clear OROPHARYNX:no exudate, no erythema and lips, buccal mucosa, and tongue normal  NECK: supple, thyroid normal size, non-tender, without nodularity LYMPH:  no palpable lymphadenopathy in the cervical, axillary or inguinal LUNGS: clear to auscultation and percussion with normal breathing effort HEART: regular rate & rhythm and no murmurs and no lower extremity edema ABDOMEN:abdomen soft, non-tender and normal bowel sounds Musculoskeletal:no cyanosis of digits and no clubbing  NEURO: alert & oriented x 3 with fluent speech, no focal motor/sensory deficits  LABORATORY DATA:  I have reviewed the data as listed    Component Value Date/Time   NA 141 01/27/2021 0847   NA 143 07/14/2017 1245   K 3.7 01/27/2021  0847   K 3.3 (L) 07/14/2017 1245   CL 111 01/27/2021 0847   CO2 25 01/27/2021 0847   CO2 20 (L) 07/14/2017 1245   GLUCOSE 79 01/27/2021 0847   GLUCOSE 106 07/14/2017 1245   BUN 12 01/27/2021 0847   BUN 9.3 07/14/2017 1245   CREATININE 1.01 (H) 01/27/2021 0847   CREATININE 0.8 07/14/2017 1245   CALCIUM 9.2 01/27/2021 0847   CALCIUM 8.4 07/14/2017 1245   PROT 7.2 01/27/2021 0847   PROT 6.0 07/14/2017 1245   PROT 6.2 (L) 07/14/2017 1245   ALBUMIN 4.2 01/27/2021 0847   ALBUMIN 3.6 07/14/2017 1245   AST 21 01/27/2021 0847   AST 21 07/14/2017 1245   ALT 14 01/27/2021 0847   ALT 21 07/14/2017 1245   ALKPHOS  50 01/27/2021 0847   ALKPHOS 78 07/14/2017 1245   BILITOT 0.6 01/27/2021 0847   BILITOT 0.43 07/14/2017 1245   GFRNONAA 60 (L) 01/27/2021 0847   GFRAA 57 (L) 04/21/2020 0817   GFRAA >60 01/14/2020 0828    No results found for: SPEP, UPEP  Lab Results  Component Value Date   WBC 4.2 01/27/2021   NEUTROABS 1.3 (L) 01/27/2021   HGB 11.1 (L) 01/27/2021   HCT 34.2 (L) 01/27/2021   MCV 83.6 01/27/2021   PLT 301 01/27/2021      Chemistry      Component Value Date/Time   NA 141 01/27/2021 0847   NA 143 07/14/2017 1245   K 3.7 01/27/2021 0847   K 3.3 (L) 07/14/2017 1245   CL 111 01/27/2021 0847   CO2 25 01/27/2021 0847   CO2 20 (L) 07/14/2017 1245   BUN 12 01/27/2021 0847   BUN 9.3 07/14/2017 1245   CREATININE 1.01 (H) 01/27/2021 0847   CREATININE 0.8 07/14/2017 1245      Component Value Date/Time   CALCIUM 9.2 01/27/2021 0847   CALCIUM 8.4 07/14/2017 1245   ALKPHOS 50 01/27/2021 0847   ALKPHOS 78 07/14/2017 1245   AST 21 01/27/2021 0847   AST 21 07/14/2017 1245   ALT 14 01/27/2021 0847   ALT 21 07/14/2017 1245   BILITOT 0.6 01/27/2021 0847   BILITOT 0.43 07/14/2017 1245

## 2021-01-27 NOTE — Assessment & Plan Note (Signed)
I have reviewed lab results from her primary care doctor which show she has significant vitamin B12 deficiency The patient does not want to get B12 injection through her primary care doctor I have arranged for her to get B12 injection while she is receiving treatment here once a month and she is in agreement

## 2021-01-27 NOTE — Patient Instructions (Signed)
[facility]  Discharge Instructions: Thank you for choosing [facility] to provide your oncology and hematology care.   If you have a lab appointment with the [facility], please go directly to the [facility] and check in at the registration area.   Wear comfortable clothing and clothing appropriate for easy access to any Portacath or PICC line.   We strive to give you quality time with your provider. You may need to reschedule your appointment if you arrive late (15 or more minutes).  Arriving late affects you and other patients whose appointments are after yours.  Also, if you miss three or more appointments without notifying the office, you may be dismissed from the clinic at the provider's discretion.      For prescription refill requests, have your pharmacy contact our office and allow 72 hours for refills to be completed.    Today you received the following chemotherapy and/or immunotherapy agents Darzalex Faspro, B12    To help prevent nausea and vomiting after your treatment, we encourage you to take your nausea medication as directed.  BELOW ARE SYMPTOMS THAT SHOULD BE REPORTED IMMEDIATELY: *FEVER GREATER THAN 100.4 F (38 C) OR HIGHER *CHILLS OR SWEATING *NAUSEA AND VOMITING THAT IS NOT CONTROLLED WITH YOUR NAUSEA MEDICATION *UNUSUAL SHORTNESS OF BREATH *UNUSUAL BRUISING OR BLEEDING *URINARY PROBLEMS (pain or burning when urinating, or frequent urination) *BOWEL PROBLEMS (unusual diarrhea, constipation, pain near the anus) TENDERNESS IN MOUTH AND THROAT WITH OR WITHOUT PRESENCE OF ULCERS (sore throat, sores in mouth, or a toothache) UNUSUAL RASH, SWELLING OR PAIN  UNUSUAL VAGINAL DISCHARGE OR ITCHING   Items with * indicate a potential emergency and should be followed up as soon as possible or go to the Emergency Department if any problems should occur.  Please show the CHEMOTHERAPY ALERT CARD or IMMUNOTHERAPY ALERT CARD at  check-in to the Emergency Department and triage nurse.  Should you have questions after your visit or need to cancel or reschedule your appointment, please contact [facility]  Dept: [phone]  and follow the prompts.  Office hours are 8:00 a.m. to 4:30 p.m. Monday - Friday. Please note that voicemails left after 4:00 p.m. may not be returned until the following business day.  We are closed weekends and major holidays. You have access to a nurse at all times for urgent questions. Please call the main number to the clinic Dept: [phone] and follow the prompts.   For any non-urgent questions, you may also contact your provider using MyChart. We now offer e-Visits for anyone 51 and older to request care online for non-urgent symptoms. For details visit [email or web address].   Also download the MyChart app! Go to the app store, search "MyChart", open the app, select [facility], and log in with your MyChart username and password.  Due to Covid, a mask is required upon entering the hospital/clinic. If you do not have a mask, one will be given to you upon arrival. For doctor visits, patients may have 1 support person aged 47 or older with them. For treatment visits, patients cannot have anyone with them due to current Covid guidelines and our immunocompromised population.

## 2021-01-27 NOTE — Telephone Encounter (Signed)
Scheduled appointment per 06/21 sch msg. Patient is aware. 

## 2021-01-27 NOTE — Progress Notes (Signed)
Anc 1.3, okay to proceed with tx as scheduled per Dr. Alvy Bimler

## 2021-01-27 NOTE — Assessment & Plan Note (Signed)
I have reviewed recent myeloma panel with the patient  I have noticed the fluctuation M protein and light chains are slightly trending up and down She is not symptomatic for now, I recommend we continue on Faspro maintenance treatment We will continue myeloma panel once a month If her M protein start to trend higher than 0.5, we will start myeloma work-up again and consider adding additional therapy She agreed with the plan of care I have received her bone density scan results which looks good I plan to space out Zometa every 6 months, next will be due in August She will continue calcium with vitamin D supplement

## 2021-01-28 LAB — KAPPA/LAMBDA LIGHT CHAINS
Kappa free light chain: 30.5 mg/L — ABNORMAL HIGH (ref 3.3–19.4)
Kappa, lambda light chain ratio: 12.2 — ABNORMAL HIGH (ref 0.26–1.65)
Lambda free light chains: 2.5 mg/L — ABNORMAL LOW (ref 5.7–26.3)

## 2021-01-30 ENCOUNTER — Telehealth: Payer: Self-pay

## 2021-01-30 LAB — MULTIPLE MYELOMA PANEL, SERUM
Albumin SerPl Elph-Mcnc: 3.9 g/dL (ref 2.9–4.4)
Albumin/Glob SerPl: 1.5 (ref 0.7–1.7)
Alpha 1: 0.2 g/dL (ref 0.0–0.4)
Alpha2 Glob SerPl Elph-Mcnc: 0.7 g/dL (ref 0.4–1.0)
B-Globulin SerPl Elph-Mcnc: 1 g/dL (ref 0.7–1.3)
Gamma Glob SerPl Elph-Mcnc: 0.7 g/dL (ref 0.4–1.8)
Globulin, Total: 2.7 g/dL (ref 2.2–3.9)
IgA: 6 mg/dL — ABNORMAL LOW (ref 64–422)
IgG (Immunoglobin G), Serum: 786 mg/dL (ref 586–1602)
IgM (Immunoglobulin M), Srm: <5 mg/dL — ABNORMAL LOW (ref 26–217)
M Protein SerPl Elph-Mcnc: 0.6 g/dL — ABNORMAL HIGH
Total Protein ELP: 6.6 g/dL (ref 6.0–8.5)

## 2021-01-30 NOTE — Telephone Encounter (Signed)
-----   Message from [name], MD sent at 01/30/2021  1:39 PM EDT ----- Pls call her M protein is  abit high I recommend the patient to bring her husband along in her next visit for further discussion

## 2021-01-30 NOTE — Telephone Encounter (Signed)
Called and given below message. She verbalized understanding. 

## 2021-02-08 ENCOUNTER — Other Ambulatory Visit: Payer: Self-pay | Admitting: Hematology and Oncology

## 2021-02-10 ENCOUNTER — Encounter: Payer: Self-pay | Admitting: Hematology and Oncology

## 2021-02-23 ENCOUNTER — Other Ambulatory Visit: Payer: Self-pay | Admitting: Hematology and Oncology

## 2021-02-24 ENCOUNTER — Encounter: Payer: Self-pay | Admitting: Hematology and Oncology

## 2021-02-24 ENCOUNTER — Telehealth: Payer: Self-pay

## 2021-02-24 ENCOUNTER — Inpatient Hospital Stay: Payer: Medicare Other | Attending: Hematology and Oncology

## 2021-02-24 ENCOUNTER — Other Ambulatory Visit: Payer: Self-pay

## 2021-02-24 ENCOUNTER — Inpatient Hospital Stay: Payer: Medicare Other

## 2021-02-24 ENCOUNTER — Inpatient Hospital Stay (HOSPITAL_BASED_OUTPATIENT_CLINIC_OR_DEPARTMENT_OTHER): Payer: Medicare Other | Admitting: Hematology and Oncology

## 2021-02-24 ENCOUNTER — Other Ambulatory Visit (HOSPITAL_COMMUNITY): Payer: Self-pay

## 2021-02-24 ENCOUNTER — Telehealth: Payer: Self-pay | Admitting: Pharmacist

## 2021-02-24 VITALS — BP 123/88 | HR 77 | Temp 97.5°F | Resp 18 | Ht 61.0 in | Wt 109.6 lb

## 2021-02-24 DIAGNOSIS — R519 Headache, unspecified: Secondary | ICD-10-CM | POA: Diagnosis not present

## 2021-02-24 DIAGNOSIS — C9002 Multiple myeloma in relapse: Secondary | ICD-10-CM

## 2021-02-24 DIAGNOSIS — Z79899 Other long term (current) drug therapy: Secondary | ICD-10-CM | POA: Insufficient documentation

## 2021-02-24 DIAGNOSIS — E538 Deficiency of other specified B group vitamins: Secondary | ICD-10-CM

## 2021-02-24 DIAGNOSIS — D61818 Other pancytopenia: Secondary | ICD-10-CM

## 2021-02-24 DIAGNOSIS — J984 Other disorders of lung: Secondary | ICD-10-CM | POA: Insufficient documentation

## 2021-02-24 DIAGNOSIS — Z7189 Other specified counseling: Secondary | ICD-10-CM

## 2021-02-24 DIAGNOSIS — Z9484 Stem cells transplant status: Secondary | ICD-10-CM | POA: Diagnosis not present

## 2021-02-24 DIAGNOSIS — D539 Nutritional anemia, unspecified: Secondary | ICD-10-CM

## 2021-02-24 DIAGNOSIS — Z5112 Encounter for antineoplastic immunotherapy: Secondary | ICD-10-CM | POA: Diagnosis present

## 2021-02-24 DIAGNOSIS — Z880 Allergy status to penicillin: Secondary | ICD-10-CM | POA: Diagnosis not present

## 2021-02-24 DIAGNOSIS — Z886 Allergy status to analgesic agent status: Secondary | ICD-10-CM | POA: Diagnosis not present

## 2021-02-24 DIAGNOSIS — Z885 Allergy status to narcotic agent status: Secondary | ICD-10-CM | POA: Diagnosis not present

## 2021-02-24 DIAGNOSIS — Z9481 Bone marrow transplant status: Secondary | ICD-10-CM

## 2021-02-24 DIAGNOSIS — Z881 Allergy status to other antibiotic agents status: Secondary | ICD-10-CM | POA: Insufficient documentation

## 2021-02-24 DIAGNOSIS — C9001 Multiple myeloma in remission: Secondary | ICD-10-CM

## 2021-02-24 LAB — CBC WITH DIFFERENTIAL/PLATELET
Abs Immature Granulocytes: 0.01 10*3/uL (ref 0.00–0.07)
Basophils Absolute: 0 10*3/uL (ref 0.0–0.1)
Basophils Relative: 1 %
Eosinophils Absolute: 0.1 10*3/uL (ref 0.0–0.5)
Eosinophils Relative: 3 %
HCT: 35.1 % — ABNORMAL LOW (ref 36.0–46.0)
Hemoglobin: 11.7 g/dL — ABNORMAL LOW (ref 12.0–15.0)
Immature Granulocytes: 0 %
Lymphocytes Relative: 56 %
Lymphs Abs: 2.1 10*3/uL (ref 0.7–4.0)
MCH: 27.5 pg (ref 26.0–34.0)
MCHC: 33.3 g/dL (ref 30.0–36.0)
MCV: 82.4 fL (ref 80.0–100.0)
Monocytes Absolute: 0.4 10*3/uL (ref 0.1–1.0)
Monocytes Relative: 9 %
Neutro Abs: 1.2 10*3/uL — ABNORMAL LOW (ref 1.7–7.7)
Neutrophils Relative %: 31 %
Platelets: 283 10*3/uL (ref 150–400)
RBC: 4.26 MIL/uL (ref 3.87–5.11)
RDW: 13.4 % (ref 11.5–15.5)
WBC: 3.8 10*3/uL — ABNORMAL LOW (ref 4.0–10.5)
nRBC: 0 % (ref 0.0–0.2)

## 2021-02-24 LAB — COMPREHENSIVE METABOLIC PANEL
ALT: 8 U/L (ref 0–44)
AST: 19 U/L (ref 15–41)
Albumin: 4 g/dL (ref 3.5–5.0)
Alkaline Phosphatase: 57 U/L (ref 38–126)
Anion gap: 8 (ref 5–15)
BUN: 15 mg/dL (ref 8–23)
CO2: 25 mmol/L (ref 22–32)
Calcium: 9.5 mg/dL (ref 8.9–10.3)
Chloride: 105 mmol/L (ref 98–111)
Creatinine, Ser: 1.13 mg/dL — ABNORMAL HIGH (ref 0.44–1.00)
GFR, Estimated: 52 mL/min — ABNORMAL LOW (ref >60)
Glucose, Bld: 77 mg/dL (ref 70–99)
Potassium: 3.8 mmol/L (ref 3.5–5.1)
Sodium: 138 mmol/L (ref 135–145)
Total Bilirubin: 0.6 mg/dL (ref 0.3–1.2)
Total Protein: 7.3 g/dL (ref 6.5–8.1)

## 2021-02-24 LAB — VITAMIN B12: Vitamin B-12: 457 pg/mL (ref 180–914)

## 2021-02-24 MED ORDER — CYANOCOBALAMIN 1000 MCG/ML IJ SOLN
INTRAMUSCULAR | Status: AC
  Filled 2021-02-24: qty 1

## 2021-02-24 MED ORDER — ACETAMINOPHEN 325 MG PO TABS
ORAL_TABLET | ORAL | Status: AC
  Filled 2021-02-24: qty 2

## 2021-02-24 MED ORDER — ACETAMINOPHEN 325 MG PO TABS
650.0000 mg | ORAL_TABLET | Freq: Once | ORAL | Status: AC
  Administered 2021-02-24: 650 mg via ORAL

## 2021-02-24 MED ORDER — DARATUMUMAB-HYALURONIDASE-FIHJ 1800-30000 MG-UT/15ML ~~LOC~~ SOLN
1800.0000 mg | Freq: Once | SUBCUTANEOUS | Status: AC
  Administered 2021-02-24: 1800 mg via SUBCUTANEOUS
  Filled 2021-02-24: qty 15

## 2021-02-24 MED ORDER — POMALIDOMIDE 2 MG PO CAPS: ORAL_CAPSULE | ORAL | 0 refills | Status: DC

## 2021-02-24 MED ORDER — SODIUM CHLORIDE 0.9 % IV SOLN
Freq: Once | INTRAVENOUS | Status: DC
  Filled 2021-02-24: qty 250

## 2021-02-24 MED ORDER — DEXAMETHASONE 4 MG PO TABS
8.0000 mg | ORAL_TABLET | Freq: Once | ORAL | Status: AC
Start: 2021-02-24 — End: 2021-02-24
  Administered 2021-02-24: 8 mg via ORAL

## 2021-02-24 MED ORDER — MONTELUKAST SODIUM 10 MG PO TABS
10.0000 mg | ORAL_TABLET | Freq: Once | ORAL | Status: AC
  Administered 2021-02-24: 10 mg via ORAL

## 2021-02-24 MED ORDER — CYANOCOBALAMIN 1000 MCG/ML IJ SOLN
1000.0000 ug | Freq: Once | INTRAMUSCULAR | Status: AC
  Administered 2021-02-24: 1000 ug via INTRAMUSCULAR

## 2021-02-24 MED ORDER — MONTELUKAST SODIUM 10 MG PO TABS
ORAL_TABLET | ORAL | Status: AC
  Filled 2021-02-24: qty 1

## 2021-02-24 MED ORDER — DEXAMETHASONE 4 MG PO TABS
ORAL_TABLET | ORAL | Status: AC
  Filled 2021-02-24: qty 2

## 2021-02-24 NOTE — Assessment & Plan Note (Signed)
I have reviewed her myeloma panel for the past few months The patient started to have signs of cancer relapse We discussed the risk, benefits, side effects of pursuing further work-up versus keeping it in a conservative approach We discussed the risk and benefits of doing bone marrow biopsy and the patient felt it is not necessary I will order a skeletal survey, to be time and done in her next visit We discussed the risk, benefits, side effects of treatment with pomalidomide and she is in agreement to proceed Due to her chronic leukopenia, I doubt she can take high-dose Pomalyst I recommend starting her at 2 mg for 14 days, rest 7 days for cycle of every 21 days She will continue on subcutaneous daratumumab once a month I do not plan to start her back on high-dose dexamethasone due to previous side effects She will continue acyclovir for antimicrobial prophylaxis and I reminded her to continue on aspirin for DVT prophylaxis Tentative plan of start date would be next Monday on July 25 If not possible, we will start her on August 1

## 2021-02-24 NOTE — Progress Notes (Signed)
[facility] OFFICE PROGRESS NOTE  Patient Care Team: Josetta Huddle, MD as PCP - General (Internal Medicine) Ginette Pitman, MD as Consulting Physician (Hematology and Oncology) Hessie Dibble, MD as Consulting Physician (Hematology and Oncology)  ASSESSMENT & PLAN:  Multiple myeloma in relapse [facility]) I have reviewed her myeloma panel for the past few months The patient started to have signs of cancer relapse We discussed the risk, benefits, side effects of pursuing further work-up versus keeping it in a conservative approach We discussed the risk and benefits of doing bone marrow biopsy and the patient felt it is not necessary I will order a skeletal survey, to be time and done in her next visit We discussed the risk, benefits, side effects of treatment with pomalidomide and she is in agreement to proceed Due to her chronic leukopenia, I doubt she can take high-dose Pomalyst I recommend starting her at 2 mg for 14 days, rest 7 days for cycle of every 21 days She will continue on subcutaneous daratumumab once a month I do not plan to start her back on high-dose dexamethasone due to previous side effects She will continue acyclovir for antimicrobial prophylaxis and I reminded her to continue on aspirin for DVT prophylaxis Tentative plan of start date would be next Monday on July 25 If not possible, we will start her on August 1   Pancytopenia, acquired [facility]) She has chronic pancytopenia, 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 Observe closely  Vitamin B12 deficiency She was diagnosed with vitamin B12 deficiency likely After several doses of vitamin B12 injection, her B12 level is improved She will continue B12 monthly for now  Right sided temporal headache She has intermittent right-sided headache consistent with previous radiation  we discussed the risk and benefits of neurology consult She declined For now, she will continue to take over-the-counter  acetaminophen  Orders Placed This Encounter  Procedures   DG Bone Survey Met    Standing Status:   Future    Standing Expiration Date:   02/24/2022    Order Specific Question:   Reason for exam:    Answer:   myeloma staging    Order Specific Question:   Preferred imaging location?    Answer:   [facility]   Vitamin B12    Standing Status:   Future    Number of Occurrences:   1    Standing Expiration Date:   02/24/2022    All questions were answered. The patient knows to call the clinic with any problems, questions or concerns. The total time spent in the appointment was 40 minutes encounter with patients including review of chart and various tests results, discussions about plan of care and coordination of care plan   [name], MD 02/24/2021 11:59 AM  INTERVAL HISTORY: Please see below for problem oriented charting. She returns with her husband for further follow-up and review test results She continues to have intermittent right-sided temporal headache that comes and goes She denies recent infection, fever or chills  SUMMARY OF ONCOLOGIC HISTORY: Oncology History Overview Note   M-protein 0.69 gm/dl IFIX - IgG, Kappa IgG - 868 IgA - 19 IgM - < 20 Kappa - 21 Lambda - 5.7  09/06/2014 - Bone marrow aspirate and biopsy:   Normocellular marrow for age (40%) with a small monoclonal plasma cell population (1% on aspirate). Karyotype 51, XX  FISH Negative for myeloma associated changes  09/12/2014 - PET/CT  Two regions that are concerning for disease,  one adjacent/involving the left ninth rib and one in the marrow of the right femur, in this patient with history of plasmacytoma.    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    04/01/2015 Bone Marrow Transplant   She received melphalan chemotherapy on 03/31/2015 followed by autologous stem cell transplant the day after    04/03/2015 - 04/18/2015 Hospital Admission   The patient was admitted to the hospital at [facility] for management related to complication from stem cell transplant. She had significant nausea requiring intravenous anti-emetics.    07/17/2015 -  Chemotherapy   She started maintenance Revlimid and monthly zometa, then every 3 months   06/01/2018 Imaging   DEXA scan showed bone density T score in femur -2.3    04/20/2019 PET scan   1. No FDG avid osseous lesions or mass identified to suggest metabolically active lesion of myeloma or plasmacytoma. 2. Small nodular density within the paravertebral right lower lobe exhibits mild to moderate increased uptake within SUV max of 3.38. This is indeterminate. Review of CT chest from 10/05/2018 shows a corresponding Lung nodule in this area measuring the same. Small pulmonary neoplasm cannot be excluded. 3. Indeterminate, focal area of increased uptake is identified within the thoracic canal. Indeterminate favored to represent benign physiologic CNS activity.     04/30/2019 -  Chemotherapy       Patient is on Antibody Plan: MYELOMA SQ DARATUMUMAB FASPRO Q28D     04/30/2019 - 07/09/2019 Chemotherapy   The patient had bortezomib SQ (VELCADE) chemo injection 1.75 mg, 1.3 mg/m2 = 1.75 mg, Subcutaneous,  Once, 10 of 11 cycles Administration: 1.75 mg (04/30/2019), 1.75 mg (05/07/2019), 1.75 mg (05/14/2019), 1.75 mg (05/21/2019), 1.75 mg (05/28/2019), 1.75 mg (06/04/2019), 1.75 mg (06/11/2019), 1.75 mg (06/18/2019), 1.75 mg (06/25/2019), 1.75 mg (07/09/2019)   for chemotherapy treatment.       REVIEW OF SYSTEMS:   Constitutional: Denies fevers, chills or abnormal weight loss Eyes: Denies blurriness of vision Ears, nose, mouth, throat, and face: Denies mucositis or sore throat Respiratory: Denies cough, dyspnea or wheezes Cardiovascular: Denies palpitation, chest discomfort or lower extremity swelling Gastrointestinal:  Denies nausea, heartburn or change in bowel habits Skin: Denies abnormal skin rashes Lymphatics: Denies new lymphadenopathy or easy bruising Neurological:Denies numbness, tingling or new weaknesses Behavioral/Psych: Mood is stable, no new changes  All other systems were reviewed with the patient and are negative.  I have reviewed the  past medical history, past surgical history, social history and family history with the patient and they are unchanged from previous note.  ALLERGIES:  is allergic to augmentin [amoxicillin-pot clavulanate], albuterol, codeine, ibuprofen, nsaids, and tolmetin.  MEDICATIONS:  Current Outpatient Medications  Medication Sig Dispense Refill   pomalidomide (POMALYST) 2 MG capsule Take 1 pill daily for 14 days then rest 7 days for cycle of every 21 days 14 capsule 0   acetaminophen (TYLENOL) 500 MG tablet Take 500-1,000 mg by mouth every 6 (six) hours as needed for mild pain, moderate pain, fever or headache.     acyclovir (ZOVIRAX) 400 MG tablet TAKE 1 TABLET BY MOUTH TWICE DAILY 180 tablet 3   aspirin EC 81 MG tablet Take 81 mg by mouth daily  with breakfast.     calcium carbonate (OSCAL) 1500 (600 Ca) MG TABS tablet Take 1,500 mg by mouth daily with breakfast.     Calcium Carbonate 500 MG CHEW Chew 500 mg by mouth as needed for indigestion.     Cholecalciferol (VITAMIN D3) 2000 units capsule Take 2,000 Units by mouth daily.      famotidine (PEPCID) 20 MG tablet Take 20 mg by mouth daily.     fluorometholone (FML) 0.1 % ophthalmic suspension Place 1 drop into both eyes 3 (three) times daily.     Multiple Vitamins-Minerals (CENTRUM SILVER PO) Take 1 tablet by mouth daily.      omeprazole (PRILOSEC) 20 MG capsule TAKE 1 CAPSULE(20 MG) BY MOUTH DAILY 90 capsule 11   ondansetron (ZOFRAN) 8 MG tablet TAKE 1 TABLET(8 MG) BY MOUTH EVERY 8 HOURS AS NEEDED FOR NAUSEA OR VOMITING 60 tablet 11   senna-docusate (SENOKOT-S) 8.6-50 MG tablet Take 1-2 tablets by mouth daily as needed for mild constipation or moderate constipation.      sodium chloride (OCEAN) 0.65 % SOLN nasal spray Place 1 spray into both nostrils as needed for congestion.     triamcinolone ointment (KENALOG) 0.1 % Apply 1 application topically daily.     triamterene-hydrochlorothiazide (MAXZIDE-25) 37.5-25 MG tablet Take 0.5 tablets by mouth daily.     vitamin B-12 (CYANOCOBALAMIN) 500 MCG tablet Take 500 mcg by mouth daily.     No current facility-administered medications for this visit.   Facility-Administered Medications Ordered in Other Visits  Medication Dose Route Frequency Provider Last Rate Last Admin   0.9 %  sodium chloride infusion   Intravenous Once Brynda Heick, MD       0.9 %  sodium chloride infusion   Intravenous Once Alvy Bimler, Siria Calandro, MD       daratumumab-hyaluronidase-fihj (DARZALEX FASPRO) 1800-30000 MG-UT/15ML chemo SQ injection 1,800 mg  1,800 mg Subcutaneous Once Alvy Bimler, Rodrecus Belsky, MD        PHYSICAL EXAMINATION: ECOG PERFORMANCE STATUS: 1 - Symptomatic but completely ambulatory  Vitals:   02/24/21 1019  BP: 123/88  Pulse: 77  Resp: 18  Temp: (!) 97.5 F (36.4  C)  SpO2: 100%   Filed Weights   02/24/21 1019  Weight: 109 lb 9.6 oz (49.7 kg)    GENERAL:alert, no distress and comfortable SKIN: skin color, texture, turgor are normal, no rashes or significant lesions EYES: normal, Conjunctiva are pink and non-injected, sclera clear OROPHARYNX:no exudate, no erythema and lips, buccal mucosa, and tongue normal  NECK: supple, thyroid normal size, non-tender, without nodularity LYMPH:  no palpable lymphadenopathy in the cervical, axillary or inguinal LUNGS: clear to auscultation and percussion with normal breathing effort HEART: regular rate & rhythm and no murmurs  and no lower extremity edema ABDOMEN:abdomen soft, non-tender and normal bowel sounds Musculoskeletal:no cyanosis of digits and no clubbing  NEURO: alert & oriented x 3 with fluent speech, no focal motor/sensory deficits  LABORATORY DATA:  I have reviewed the data as listed    Component Value Date/Time   NA 138 02/24/2021 0949   NA 143 07/14/2017 1245   K 3.8 02/24/2021 0949   K 3.3 (L) 07/14/2017 1245   CL 105 02/24/2021 0949   CO2 25 02/24/2021 0949   CO2 20 (L) 07/14/2017 1245   GLUCOSE 77 02/24/2021 0949   GLUCOSE 106 07/14/2017 1245   BUN 15 02/24/2021 0949   BUN 9.3 07/14/2017 1245   CREATININE 1.13 (H) 02/24/2021 0949   CREATININE 1.01 (H) 01/27/2021 0847   CREATININE 0.8 07/14/2017 1245   CALCIUM 9.5 02/24/2021 0949   CALCIUM 8.4 07/14/2017 1245   PROT 7.3 02/24/2021 0949   PROT 6.0 07/14/2017 1245   PROT 6.2 (L) 07/14/2017 1245   ALBUMIN 4.0 02/24/2021 0949   ALBUMIN 3.6 07/14/2017 1245   AST 19 02/24/2021 0949   AST 21 01/27/2021 0847   AST 21 07/14/2017 1245   ALT 8 02/24/2021 0949   ALT 14 01/27/2021 0847   ALT 21 07/14/2017 1245   ALKPHOS 57 02/24/2021 0949   ALKPHOS 78 07/14/2017 1245   BILITOT 0.6 02/24/2021 0949   BILITOT 0.6 01/27/2021 0847   BILITOT 0.43 07/14/2017 1245   GFRNONAA 52 (L) 02/24/2021 0949   GFRNONAA 60 (L) 01/27/2021 0847   GFRAA  57 (L) 04/21/2020 0817   GFRAA >60 01/14/2020 0828    No results found for: SPEP, UPEP  Lab Results  Component Value Date   WBC 3.8 (L) 02/24/2021   NEUTROABS 1.2 (L) 02/24/2021   HGB 11.7 (L) 02/24/2021   HCT 35.1 (L) 02/24/2021   MCV 82.4 02/24/2021   PLT 283 02/24/2021      Chemistry      Component Value Date/Time   NA 138 02/24/2021 0949   NA 143 07/14/2017 1245   K 3.8 02/24/2021 0949   K 3.3 (L) 07/14/2017 1245   CL 105 02/24/2021 0949   CO2 25 02/24/2021 0949   CO2 20 (L) 07/14/2017 1245   BUN 15 02/24/2021 0949   BUN 9.3 07/14/2017 1245   CREATININE 1.13 (H) 02/24/2021 0949   CREATININE 1.01 (H) 01/27/2021 0847   CREATININE 0.8 07/14/2017 1245      Component Value Date/Time   CALCIUM 9.5 02/24/2021 0949   CALCIUM 8.4 07/14/2017 1245   ALKPHOS 57 02/24/2021 0949   ALKPHOS 78 07/14/2017 1245   AST 19 02/24/2021 0949   AST 21 01/27/2021 0847   AST 21 07/14/2017 1245   ALT 8 02/24/2021 0949   ALT 14 01/27/2021 0847   ALT 21 07/14/2017 1245   BILITOT 0.6 02/24/2021 0949   BILITOT 0.6 01/27/2021 0847   BILITOT 0.43 07/14/2017 1245

## 2021-02-24 NOTE — Telephone Encounter (Signed)
Oral Oncology Patient Advocate Encounter   Received notification from [employer] that prior authorization for Pomalyst is required.   PA submitted on CoverMyMeds Key [id number] Status is pending   Oral Oncology Clinic will continue to follow.  [facility] Patient [place] Phone [phone] Fax [phone] 02/24/2021 10:52 AM

## 2021-02-24 NOTE — Telephone Encounter (Signed)
Oral Oncology Pharmacist Encounter  Received new prescription for Pomalyst (pomalidomide) for the treatment of multiple myeloma in conjunction with daratumumab, planned duration until disease progression or unacceptable drug toxicity.  Prescription dose and frequency assessed - confirmed with MD plan for 14 days on, 7 days off for pomalyst due to concerns of tolerance and current neutropenia. CBC w/ diff and CMP from 02/24/21 assessed - noted WBC 3.8 K/uL (ANC 1.2 K/uL), SCr 1.13 mg/dL - no further dose adjustments required.  Current medication list in Epic reviewed, no relevant/significant DDIs with Pomalyst identified.  Evaluated chart and no patient barriers to medication adherence noted.   Once [employer] auth # is obtained, prescription will need to be e-scribed to [facility].   Oral Oncology Clinic will continue to follow for insurance authorization, copayment issues, initial counseling and start date.  Kalese Fanning, PharmD, BCPS Hematology/Oncology Clinical Pharmacist [facility] [phone] 02/24/2021 11:40 AM  

## 2021-02-24 NOTE — Assessment & Plan Note (Signed)
She was diagnosed with vitamin B12 deficiency likely After several doses of vitamin B12 injection, her B12 level is improved She will continue B12 monthly for now

## 2021-02-24 NOTE — Patient Instructions (Addendum)
[facility]  Discharge Instructions: Thank you for choosing [facility] to provide your oncology and hematology care.   If you have a lab appointment with the [facility], please go directly to the [facility] and check in at the registration area.   Wear comfortable clothing and clothing appropriate for easy access to any Portacath or PICC line.   We strive to give you quality time with your provider. You may need to reschedule your appointment if you arrive late (15 or more minutes).  Arriving late affects you and other patients whose appointments are after yours.  Also, if you miss three or more appointments without notifying the office, you may be dismissed from the clinic at the provider's discretion.      For prescription refill requests, have your pharmacy contact our office and allow 72 hours for refills to be completed.    Today you received the following chemotherapy and/or immunotherapy agents Daratumumab and Vitamin B12 Injections      To help prevent nausea and vomiting after your treatment, we encourage you to take your nausea medication as directed.  BELOW ARE SYMPTOMS THAT SHOULD BE REPORTED IMMEDIATELY: *FEVER GREATER THAN 100.4 F (38 C) OR HIGHER *CHILLS OR SWEATING *NAUSEA AND VOMITING THAT IS NOT CONTROLLED WITH YOUR NAUSEA MEDICATION *UNUSUAL SHORTNESS OF BREATH *UNUSUAL BRUISING OR BLEEDING *URINARY PROBLEMS (pain or burning when urinating, or frequent urination) *BOWEL PROBLEMS (unusual diarrhea, constipation, pain near the anus) TENDERNESS IN MOUTH AND THROAT WITH OR WITHOUT PRESENCE OF ULCERS (sore throat, sores in mouth, or a toothache) UNUSUAL RASH, SWELLING OR PAIN  UNUSUAL VAGINAL DISCHARGE OR ITCHING   Items with * indicate a potential emergency and should be followed up as soon as possible or go to the Emergency Department if any problems should occur.  Please show the CHEMOTHERAPY ALERT CARD or  IMMUNOTHERAPY ALERT CARD at check-in to the Emergency Department and triage nurse.  Should you have questions after your visit or need to cancel or reschedule your appointment, please contact [facility]  Dept: [phone]  and follow the prompts.  Office hours are 8:00 a.m. to 4:30 p.m. Monday - Friday. Please note that voicemails left after 4:00 p.m. may not be returned until the following business day.  We are closed weekends and major holidays. You have access to a nurse at all times for urgent questions. Please call the main number to the clinic Dept: [phone] and follow the prompts.   For any non-urgent questions, you may also contact your provider using MyChart. We now offer e-Visits for anyone 40 and older to request care online for non-urgent symptoms. For details visit [email or web address].   Also download the MyChart app! Go to the app store, search "MyChart", open the app, select [facility], and log in with your MyChart username and password.  Due to Covid, a mask is required upon entering the hospital/clinic. If you do not have a mask, one will be given to you upon arrival. For doctor visits, patients may have 1 support person aged 76 or older with them. For treatment visits, patients cannot have anyone with them due to current Covid guidelines and our immunocompromised population.

## 2021-02-24 NOTE — Assessment & Plan Note (Signed)
She has intermittent right-sided headache consistent with previous radiation  we discussed the risk and benefits of neurology consult She declined For now, she will continue to take over-the-counter acetaminophen

## 2021-02-24 NOTE — Assessment & Plan Note (Signed)
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

## 2021-02-24 NOTE — Progress Notes (Signed)
Per Dr. Alvy Bimler, "OK To Treat with ANC of 1.2 today".

## 2021-02-25 LAB — KAPPA/LAMBDA LIGHT CHAINS
Kappa free light chain: 41.1 mg/L — ABNORMAL HIGH (ref 3.3–19.4)
Kappa, lambda light chain ratio: 17.13 — ABNORMAL HIGH (ref 0.26–1.65)
Lambda free light chains: 2.4 mg/L — ABNORMAL LOW (ref 5.7–26.3)

## 2021-02-26 ENCOUNTER — Other Ambulatory Visit: Payer: Self-pay | Admitting: Hematology and Oncology

## 2021-02-26 LAB — MULTIPLE MYELOMA PANEL, SERUM
Albumin SerPl Elph-Mcnc: 4 g/dL (ref 2.9–4.4)
Albumin/Glob SerPl: 1.5 (ref 0.7–1.7)
Alpha 1: 0.2 g/dL (ref 0.0–0.4)
Alpha2 Glob SerPl Elph-Mcnc: 0.7 g/dL (ref 0.4–1.0)
B-Globulin SerPl Elph-Mcnc: 1 g/dL (ref 0.7–1.3)
Gamma Glob SerPl Elph-Mcnc: 0.8 g/dL (ref 0.4–1.8)
Globulin, Total: 2.7 g/dL (ref 2.2–3.9)
IgA: 7 mg/dL — ABNORMAL LOW (ref 64–422)
IgG (Immunoglobin G), Serum: 836 mg/dL (ref 586–1602)
IgM (Immunoglobulin M), Srm: <5 mg/dL — ABNORMAL LOW (ref 26–217)
M Protein SerPl Elph-Mcnc: 0.7 g/dL — ABNORMAL HIGH
Total Protein ELP: 6.7 g/dL (ref 6.0–8.5)

## 2021-02-26 NOTE — Telephone Encounter (Signed)
Oral Chemotherapy Pharmacist Encounter  I spoke with patient for overview of: Pomalyst (pomalidomide) for the treatment of relapsed multiple myeloma in conjunction with daratumumab, planned duration until disease progression or unacceptable toxicity.   Counseled patient on administration, dosing, side effects, monitoring, drug-food interactions, safe handling, storage, and disposal.  Patient will take Pomalyst 2mg capsules, 1 capsule by mouth once daily, without regard to food, with a full glass of water.  Pomalyst will be given 14 days on, 7 days off, repeat every 21 days.  Pomalyst start date: 03/02/21  Adverse effects of Pomalyst include but are not limited to: nausea, constipation, diarrhea, abdominal pain, rash, fatigue, drug fever, peripheral edema, and decreased blood counts.    Reviewed with patient importance of keeping a medication schedule and plan for any missed doses. No barriers to medication adherence identified.  Medication reconciliation performed and medication/allergy list updated.   Patient continues to take acyclovir. No dexamethasone per MD note. Patient continues to take daily aspirin 81mg for VTE prophylaxis.  Insurance authorization for Pomalyst has been obtained.  Pomalyst prescription is being dispensed from [facility] as it is a limited distribution medication. This will be delivered to patient's home on 02/27/21.  All questions answered.  Ms. Corniel voiced understanding and appreciation.   Medication education handout placed in mail for patient. Patient knows to call the office with questions or concerns. Oral Chemotherapy Clinic phone number provided to patient.   Denajah Fanning, PharmD, BCPS Hematology/Oncology Clinical Pharmacist [facility] [phone] 02/26/2021 12:01 PM  

## 2021-02-26 NOTE — Telephone Encounter (Signed)
Thanks, Latashia Koch, can you schedule bone survey, labs and then  see me on 8/1?

## 2021-02-27 ENCOUNTER — Telehealth: Payer: Self-pay

## 2021-02-27 NOTE — Telephone Encounter (Signed)
Called radiology scheduling to attempt to move bone survey to 8/1 no appts available. Left appt scheduled on 7/26.  Scheduled appt on 8/1 for lab and Dr. Alvy Bimler. She is aware of appt times/date.

## 2021-02-27 NOTE — Telephone Encounter (Signed)
She left a message to add Claritin 10 mg to medication list. Medication added.

## 2021-02-27 NOTE — Telephone Encounter (Signed)
Oral Chemotherapy Pharmacist Encounter   Re-reviewed patient's medication list for drug-drug interactions, with the addition of Claritin (loratadine). Still no drug-drug interactions with Pomalyst (pomalidomide). Called patient to let her know, she started her understanding. She had no other questions about the pomalidomide at this time.   Darl Pikes, PharmD, BCPS, BCOP, CPP Hematology/Oncology Clinical Pharmacist [facility] [phone]  02/27/2021 2:13 PM

## 2021-03-02 NOTE — Telephone Encounter (Signed)
Oral Oncology Patient Advocate Encounter  Prior Authorization for Pomalyst has been approved.    PA# [id number] Effective dates: 02/26/21 through 08/08/21  Oral Oncology Clinic will continue to follow.   [place] Patient [place] Phone [phone] Fax [phone] 03/02/2021 11:35 AM

## 2021-03-03 ENCOUNTER — Other Ambulatory Visit: Payer: Self-pay

## 2021-03-03 ENCOUNTER — Ambulatory Visit (HOSPITAL_COMMUNITY)
Admission: RE | Admit: 2021-03-03 | Discharge: 2021-03-03 | Disposition: A | Discharge status: Home / Self Care | Payer: Medicare Other | Source: Ambulatory Visit | Attending: Hematology and Oncology | Admitting: Hematology and Oncology

## 2021-03-03 DIAGNOSIS — C9002 Multiple myeloma in relapse: Secondary | ICD-10-CM

## 2021-03-05 ENCOUNTER — Telehealth: Payer: Self-pay

## 2021-03-05 NOTE — Telephone Encounter (Signed)
Returned her call. She vomited after taking aspirin. Asking if she soul repeat the aspirin?  Called back and told per Dr. Alvy Bimler. Do not take another aspirin today. Take tomorrow after eating largest meal. She verbalized understanding.

## 2021-03-09 ENCOUNTER — Emergency Department (HOSPITAL_COMMUNITY)
Admission: EM | Admit: 2021-03-09 | Discharge: 2021-03-09 | Disposition: A | Discharge status: Home / Self Care | Payer: Medicare Other | Attending: Emergency Medicine | Admitting: Emergency Medicine

## 2021-03-09 ENCOUNTER — Other Ambulatory Visit: Payer: Self-pay

## 2021-03-09 ENCOUNTER — Inpatient Hospital Stay: Payer: Medicare Other | Attending: Hematology and Oncology

## 2021-03-09 ENCOUNTER — Encounter (HOSPITAL_COMMUNITY): Payer: Self-pay

## 2021-03-09 ENCOUNTER — Emergency Department (HOSPITAL_COMMUNITY): Payer: Medicare Other

## 2021-03-09 ENCOUNTER — Encounter: Payer: Self-pay | Admitting: Hematology and Oncology

## 2021-03-09 ENCOUNTER — Inpatient Hospital Stay (HOSPITAL_BASED_OUTPATIENT_CLINIC_OR_DEPARTMENT_OTHER): Payer: Medicare Other | Admitting: Hematology and Oncology

## 2021-03-09 ENCOUNTER — Other Ambulatory Visit: Payer: Self-pay | Admitting: Hematology and Oncology

## 2021-03-09 VITALS — BP 92/60 | HR 62 | Temp 98.0°F | Resp 18 | Ht 61.0 in | Wt 110.2 lb

## 2021-03-09 DIAGNOSIS — W01198A Fall on same level from slipping, tripping and stumbling with subsequent striking against other object, initial encounter: Secondary | ICD-10-CM | POA: Diagnosis not present

## 2021-03-09 DIAGNOSIS — L270 Generalized skin eruption due to drugs and medicaments taken internally: Secondary | ICD-10-CM | POA: Insufficient documentation

## 2021-03-09 DIAGNOSIS — Z9481 Bone marrow transplant status: Secondary | ICD-10-CM | POA: Diagnosis not present

## 2021-03-09 DIAGNOSIS — R42 Dizziness and giddiness: Secondary | ICD-10-CM | POA: Diagnosis not present

## 2021-03-09 DIAGNOSIS — Z9484 Stem cells transplant status: Secondary | ICD-10-CM | POA: Insufficient documentation

## 2021-03-09 DIAGNOSIS — Z7982 Long term (current) use of aspirin: Secondary | ICD-10-CM | POA: Diagnosis not present

## 2021-03-09 DIAGNOSIS — R55 Syncope and collapse: Secondary | ICD-10-CM | POA: Insufficient documentation

## 2021-03-09 DIAGNOSIS — C9002 Multiple myeloma in relapse: Secondary | ICD-10-CM | POA: Diagnosis not present

## 2021-03-09 DIAGNOSIS — Z887 Allergy status to serum and vaccine status: Secondary | ICD-10-CM | POA: Insufficient documentation

## 2021-03-09 DIAGNOSIS — S0101XA Laceration without foreign body of scalp, initial encounter: Secondary | ICD-10-CM | POA: Diagnosis not present

## 2021-03-09 DIAGNOSIS — Z23 Encounter for immunization: Secondary | ICD-10-CM | POA: Diagnosis not present

## 2021-03-09 DIAGNOSIS — M50322 Other cervical disc degeneration at C5-C6 level: Secondary | ICD-10-CM | POA: Insufficient documentation

## 2021-03-09 DIAGNOSIS — N183 Chronic kidney disease, stage 3 unspecified: Secondary | ICD-10-CM | POA: Insufficient documentation

## 2021-03-09 DIAGNOSIS — Z885 Allergy status to narcotic agent status: Secondary | ICD-10-CM | POA: Insufficient documentation

## 2021-03-09 DIAGNOSIS — R0781 Pleurodynia: Secondary | ICD-10-CM | POA: Insufficient documentation

## 2021-03-09 DIAGNOSIS — R112 Nausea with vomiting, unspecified: Secondary | ICD-10-CM | POA: Insufficient documentation

## 2021-03-09 DIAGNOSIS — R0789 Other chest pain: Secondary | ICD-10-CM | POA: Diagnosis not present

## 2021-03-09 DIAGNOSIS — Z79899 Other long term (current) drug therapy: Secondary | ICD-10-CM | POA: Insufficient documentation

## 2021-03-09 DIAGNOSIS — W01119A Fall on same level from slipping, tripping and stumbling with subsequent striking against unspecified sharp object, initial encounter: Secondary | ICD-10-CM | POA: Insufficient documentation

## 2021-03-09 DIAGNOSIS — T07XXXA Unspecified multiple injuries, initial encounter: Secondary | ICD-10-CM

## 2021-03-09 DIAGNOSIS — G620 Drug-induced polyneuropathy: Secondary | ICD-10-CM | POA: Insufficient documentation

## 2021-03-09 DIAGNOSIS — C9001 Multiple myeloma in remission: Secondary | ICD-10-CM

## 2021-03-09 DIAGNOSIS — Z886 Allergy status to analgesic agent status: Secondary | ICD-10-CM | POA: Insufficient documentation

## 2021-03-09 DIAGNOSIS — I6782 Cerebral ischemia: Secondary | ICD-10-CM | POA: Insufficient documentation

## 2021-03-09 DIAGNOSIS — I129 Hypertensive chronic kidney disease with stage 1 through stage 4 chronic kidney disease, or unspecified chronic kidney disease: Secondary | ICD-10-CM | POA: Diagnosis not present

## 2021-03-09 DIAGNOSIS — S0990XA Unspecified injury of head, initial encounter: Secondary | ICD-10-CM

## 2021-03-09 DIAGNOSIS — W19XXXA Unspecified fall, initial encounter: Secondary | ICD-10-CM

## 2021-03-09 DIAGNOSIS — J984 Other disorders of lung: Secondary | ICD-10-CM | POA: Insufficient documentation

## 2021-03-09 DIAGNOSIS — S60229A Contusion of unspecified hand, initial encounter: Secondary | ICD-10-CM | POA: Diagnosis not present

## 2021-03-09 DIAGNOSIS — M8580 Other specified disorders of bone density and structure, unspecified site: Secondary | ICD-10-CM | POA: Insufficient documentation

## 2021-03-09 DIAGNOSIS — Z881 Allergy status to other antibiotic agents status: Secondary | ICD-10-CM | POA: Insufficient documentation

## 2021-03-09 DIAGNOSIS — Z880 Allergy status to penicillin: Secondary | ICD-10-CM | POA: Insufficient documentation

## 2021-03-09 DIAGNOSIS — T451X5A Adverse effect of antineoplastic and immunosuppressive drugs, initial encounter: Secondary | ICD-10-CM | POA: Insufficient documentation

## 2021-03-09 LAB — COMPREHENSIVE METABOLIC PANEL
ALT: 12 U/L (ref 0–44)
ALT: 15 U/L (ref 0–44)
AST: 18 U/L (ref 15–41)
AST: 22 U/L (ref 15–41)
Albumin: 3.8 g/dL (ref 3.5–5.0)
Albumin: 4.2 g/dL (ref 3.5–5.0)
Alkaline Phosphatase: 55 U/L (ref 38–126)
Alkaline Phosphatase: 51 U/L (ref 38–126)
Anion gap: 10 (ref 5–15)
Anion gap: 9 (ref 5–15)
BUN: 12 mg/dL (ref 8–23)
BUN: 15 mg/dL (ref 8–23)
CO2: 23 mmol/L (ref 22–32)
CO2: 23 mmol/L (ref 22–32)
Calcium: 9.5 mg/dL (ref 8.9–10.3)
Calcium: 9.1 mg/dL (ref 8.9–10.3)
Chloride: 95 mmol/L — ABNORMAL LOW (ref 98–111)
Chloride: 93 mmol/L — ABNORMAL LOW (ref 98–111)
Creatinine, Ser: 1.01 mg/dL — ABNORMAL HIGH (ref 0.44–1.00)
Creatinine, Ser: 0.89 mg/dL (ref 0.44–1.00)
GFR, Estimated: 60 mL/min — ABNORMAL LOW (ref >60)
GFR, Estimated: >60 mL/min (ref >60)
Glucose, Bld: 83 mg/dL (ref 70–99)
Glucose, Bld: 86 mg/dL (ref 70–99)
Potassium: 3.9 mmol/L (ref 3.5–5.1)
Potassium: 4.1 mmol/L (ref 3.5–5.1)
Sodium: 128 mmol/L — ABNORMAL LOW (ref 135–145)
Sodium: 125 mmol/L — ABNORMAL LOW (ref 135–145)
Total Bilirubin: 0.7 mg/dL (ref 0.3–1.2)
Total Bilirubin: 0.8 mg/dL (ref 0.3–1.2)
Total Protein: 6.8 g/dL (ref 6.5–8.1)
Total Protein: 7.1 g/dL (ref 6.5–8.1)

## 2021-03-09 LAB — CBC WITH DIFFERENTIAL/PLATELET
Abs Immature Granulocytes: 0.07 10*3/uL (ref 0.00–0.07)
Abs Immature Granulocytes: 0.1 10*3/uL — ABNORMAL HIGH (ref 0.00–0.07)
Basophils Absolute: 0.1 10*3/uL (ref 0.0–0.1)
Basophils Absolute: 0 10*3/uL (ref 0.0–0.1)
Basophils Relative: 1 %
Basophils Relative: 0 %
Eosinophils Absolute: 0.1 10*3/uL (ref 0.0–0.5)
Eosinophils Absolute: 0.1 10*3/uL (ref 0.0–0.5)
Eosinophils Relative: 3 %
Eosinophils Relative: 2 %
HCT: 32.9 % — ABNORMAL LOW (ref 36.0–46.0)
HCT: 34.9 % — ABNORMAL LOW (ref 36.0–46.0)
Hemoglobin: 11.2 g/dL — ABNORMAL LOW (ref 12.0–15.0)
Hemoglobin: 11.6 g/dL — ABNORMAL LOW (ref 12.0–15.0)
Immature Granulocytes: 1 %
Immature Granulocytes: 1 %
Lymphocytes Relative: 30 %
Lymphocytes Relative: 20 %
Lymphs Abs: 1.5 10*3/uL (ref 0.7–4.0)
Lymphs Abs: 1.5 10*3/uL (ref 0.7–4.0)
MCH: 27.1 pg (ref 26.0–34.0)
MCH: 27.5 pg (ref 26.0–34.0)
MCHC: 34 g/dL (ref 30.0–36.0)
MCHC: 33.2 g/dL (ref 30.0–36.0)
MCV: 79.5 fL — ABNORMAL LOW (ref 80.0–100.0)
MCV: 82.7 fL (ref 80.0–100.0)
Monocytes Absolute: 0.1 10*3/uL (ref 0.1–1.0)
Monocytes Absolute: 0.3 10*3/uL (ref 0.1–1.0)
Monocytes Relative: 3 %
Monocytes Relative: 4 %
Neutro Abs: 3 10*3/uL (ref 1.7–7.7)
Neutro Abs: 5.5 10*3/uL (ref 1.7–7.7)
Neutrophils Relative %: 62 %
Neutrophils Relative %: 73 %
Platelets: 210 10*3/uL (ref 150–400)
Platelets: 210 10*3/uL (ref 150–400)
RBC: 4.14 MIL/uL (ref 3.87–5.11)
RBC: 4.22 MIL/uL (ref 3.87–5.11)
RDW: 12.8 % (ref 11.5–15.5)
RDW: 13.2 % (ref 11.5–15.5)
Smear Review: NORMAL
WBC: 4.9 10*3/uL (ref 4.0–10.5)
WBC: 7.6 10*3/uL (ref 4.0–10.5)
nRBC: 0 % (ref 0.0–0.2)
nRBC: 0 % (ref 0.0–0.2)

## 2021-03-09 LAB — URINALYSIS, ROUTINE W REFLEX MICROSCOPIC
Bilirubin Urine: NEGATIVE
Glucose, UA: NEGATIVE mg/dL
Ketones, ur: NEGATIVE mg/dL
Leukocytes,Ua: NEGATIVE
Nitrite: NEGATIVE
Protein, ur: NEGATIVE mg/dL
Specific Gravity, Urine: 1.019 (ref 1.005–1.030)
pH: 5 (ref 5.0–8.0)

## 2021-03-09 MED ORDER — SODIUM CHLORIDE 0.9 % IV BOLUS
500.0000 mL | Freq: Once | INTRAVENOUS | Status: AC
  Administered 2021-03-09: 500 mL via INTRAVENOUS

## 2021-03-09 MED ORDER — ACETAMINOPHEN 325 MG PO TABS
650.0000 mg | ORAL_TABLET | Freq: Once | ORAL | Status: AC
  Administered 2021-03-09: 650 mg via ORAL

## 2021-03-09 MED ORDER — TETANUS-DIPHTH-ACELL PERTUSSIS 5-2.5-18.5 LF-MCG/0.5 IM SUSY
0.5000 mL | PREFILLED_SYRINGE | Freq: Once | INTRAMUSCULAR | Status: AC
  Administered 2021-03-09: 0.5 mL via INTRAMUSCULAR
  Filled 2021-03-09: qty 0.5

## 2021-03-09 MED ORDER — ACETAMINOPHEN 325 MG PO TABS
ORAL_TABLET | ORAL | Status: AC
  Filled 2021-03-09: qty 2

## 2021-03-09 MED ORDER — LIDOCAINE-EPINEPHRINE (PF) 2 %-1:200000 IJ SOLN
10.0000 mL | Freq: Once | INTRAMUSCULAR | Status: DC
  Filled 2021-03-09: qty 20

## 2021-03-09 NOTE — Progress Notes (Signed)
[facility] OFFICE PROGRESS NOTE  Patient Care Team: Josetta Huddle, MD as PCP - General (Internal Medicine) Ginette Pitman, MD as Consulting Physician (Hematology and Oncology) Hessie Dibble, MD as Consulting Physician (Hematology and Oncology)  ASSESSMENT & PLAN:  Multiple myeloma in relapse [facility]) She just started the addition of Pomalyst I do not believe it is the cause of her excessive nausea, postural dizziness and fall but I recommend she stop taking Pomalyst until further evaluation I reviewed recent bone skeletal survey results with the patient and her husband, no lesions are found I am concerned about her wellbeing With her significant injuries seen on exam, I recommend ER evaluation for her fall and she is in agreement  Fall against sharp object, initial encounter She has laceration on the forehead and several bruises on her body Her recent bone density scan show significant osteopenia and recent skeletal survey showed evidence of diffuse osteopenia I am concerned she might have a bone fracture Recommend ER evaluation I will refer her to cardiologist for evaluation of postural dizziness and hypotension that caused her recent fall She would also benefit from physical therapy and rehab after evaluation  Postural dizziness She has significant postural dizziness causing her syncopal episode and falls at home I will refer her to cardiologist for evaluation  Nausea with vomiting She has significant nausea and vomiting which cannot be explained by her medication I recommend GI evaluation  Orders Placed This Encounter  Procedures   Ambulatory referral to Gastroenterology    Referral Priority:   Routine    Referral Type:   Consultation    Referral Reason:   Specialty Services Required    Number of Visits Requested:   1   Ambulatory referral to Physical Therapy    Referral Priority:   Routine    Referral Type:   Physical Medicine    Referral Reason:    Specialty Services Required    Requested Specialty:   Physical Therapy    Number of Visits Requested:   1   Ambulatory referral to Cardiology    Referral Priority:   Routine    Referral Type:   Consultation    Referral Reason:   Specialty Services Required    Requested Specialty:   Cardiology    Number of Visits Requested:   1    All questions were answered. The patient knows to call the clinic with any problems, questions or concerns. The total time spent in the appointment was 40 minutes encounter with patients including review of chart and various tests results, discussions about plan of care and coordination of care plan   [name], MD 03/09/2021 8:40 AM  INTERVAL HISTORY: Please see below for problem oriented charting. She is seen this morning with her husband for evaluation of multiple myeloma She just started Pomalyst last week This morning, she woke up from her bed, felt nauseated and dizzy She started to have generalized weakness and fell She hit her head against a sharp object but and bruised both hands when she tried to brace for the impact Somehow, she also hurt her back She denies complete loss of consciousness She denies chest pain or shortness of breath or diaphoresis prior to the fall Her husband was upstairs at the time of fall and got her back up She did not seek emergency help She did not think she needed to be seen urgently, perhaps because she has appointment this morning She had a laceration on her forehead with  bleeding but that has stopped prior to seeing me Her nausea has resolved.  She is in pain  SUMMARY OF ONCOLOGIC HISTORY: Oncology History Overview Note   M-protein 0.69 gm/dl IFIX - IgG, Kappa IgG - 868 IgA - 19 IgM - < 20 Kappa - 21 Lambda - 5.7  09/06/2014 - Bone marrow aspirate and biopsy:   Normocellular marrow for age (40%) with a small monoclonal plasma cell population (1% on aspirate). Karyotype 70, XX  FISH Negative for myeloma  associated changes  09/12/2014 - PET/CT  Two regions that are concerning for disease, one adjacent/involving the left ninth rib and one in the marrow of the right femur, in this patient with history of plasmacytoma.    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    04/01/2015 Bone Marrow Transplant   She received melphalan chemotherapy on 03/31/2015 followed by autologous stem cell transplant the day after    04/03/2015 - 04/18/2015 Hospital Admission   The patient was admitted to the hospital at [facility] for management related to complication from stem cell transplant. She had significant nausea requiring intravenous anti-emetics.    07/17/2015 -  Chemotherapy   She started maintenance Revlimid and monthly zometa, then every 3 months   06/01/2018 Imaging   DEXA scan showed bone density T score in femur -2.3    04/20/2019 PET scan   1. No FDG avid osseous lesions or mass identified to suggest metabolically active lesion of myeloma or  plasmacytoma. 2. Small nodular density within the paravertebral right lower lobe exhibits mild to moderate increased uptake within SUV max of 3.38. This is indeterminate. Review of CT chest from 10/05/2018 shows a corresponding Lung nodule in this area measuring the same. Small pulmonary neoplasm cannot be excluded. 3. Indeterminate, focal area of increased uptake is identified within the thoracic canal. Indeterminate favored to represent benign physiologic CNS activity.     04/30/2019 -  Chemotherapy      Patient is on Antibody Plan: MYELOMA SQ DARATUMUMAB FASPRO Q28D     04/30/2019 - 07/09/2019 Chemotherapy   The patient had bortezomib SQ (VELCADE) chemo injection 1.75 mg, 1.3 mg/m2 = 1.75 mg, Subcutaneous,  Once, 10 of 11 cycles Administration: 1.75 mg (04/30/2019), 1.75 mg (05/07/2019), 1.75 mg (05/14/2019), 1.75 mg (05/21/2019), 1.75 mg (05/28/2019), 1.75 mg (06/04/2019), 1.75 mg (06/11/2019), 1.75 mg (06/18/2019), 1.75 mg (06/25/2019), 1.75 mg (07/09/2019)   for chemotherapy treatment.  REVIEW OF SYSTEMS:   Constitutional: Denies fevers, chills or abnormal weight loss Eyes: Denies blurriness of vision Ears, nose, mouth, throat, and face: Denies mucositis or sore throat Respiratory: Denies cough, dyspnea or wheezes Cardiovascular: Denies palpitation, chest discomfort or lower extremity swelling Lymphatics: Denies new lymphadenopathy or easy bruising Behavioral/Psych: Mood is stable, no new changes  All other systems were reviewed with the patient and are negative.  I have reviewed the past medical history, past surgical history, social history and family history with the patient and they are unchanged from previous note.  ALLERGIES:  is allergic to augmentin [amoxicillin-pot clavulanate], albuterol, codeine, ibuprofen, nsaids, and tolmetin.  MEDICATIONS:  Current Outpatient Medications  Medication Sig Dispense Refill   acetaminophen (TYLENOL) 500 MG tablet Take 500-1,000 mg  by mouth every 6 (six) hours as needed for mild pain, moderate pain, fever or headache.     acyclovir (ZOVIRAX) 400 MG tablet TAKE 1 TABLET BY MOUTH TWICE DAILY 180 tablet 3   aspirin EC 81 MG tablet Take 81 mg by mouth daily with breakfast.     calcium carbonate (OSCAL) 1500 (600 Ca) MG TABS tablet Take 1,500 mg by mouth daily with breakfast.     Calcium Carbonate 500 MG CHEW Chew 500 mg by mouth as needed for indigestion.     Cholecalciferol (VITAMIN D3) 2000 units capsule Take 2,000 Units by mouth daily.      famotidine (PEPCID) 20 MG tablet Take 20 mg by mouth daily.     fluorometholone (FML) 0.1 % ophthalmic suspension Place 1 drop into both eyes 3 (three) times daily.     loratadine (CLARITIN) 10 MG tablet Take 10 mg by mouth daily.     Multiple Vitamins-Minerals (CENTRUM SILVER PO) Take 1 tablet by mouth daily.      omeprazole (PRILOSEC) 20 MG capsule TAKE 1 CAPSULE(20 MG) BY MOUTH DAILY 90 capsule 11   ondansetron (ZOFRAN) 8 MG tablet TAKE 1 TABLET(8 MG) BY MOUTH EVERY 8 HOURS AS NEEDED FOR NAUSEA OR VOMITING 60 tablet 11   pomalidomide (POMALYST) 2 MG capsule Take 1 pill daily for 14 days then rest 7 days for cycle of every 21 days 14 capsule 0   senna-docusate (SENOKOT-S) 8.6-50 MG tablet Take 1-2 tablets by mouth daily as needed for mild constipation or moderate constipation.      sodium chloride (OCEAN) 0.65 % SOLN nasal spray Place 1 spray into both nostrils as needed for congestion.     triamcinolone ointment (KENALOG) 0.1 % Apply 1 application topically daily.     triamterene-hydrochlorothiazide (MAXZIDE-25) 37.5-25 MG tablet Take 0.5 tablets by mouth daily.     vitamin B-12 (CYANOCOBALAMIN) 500 MCG tablet Take 500 mcg by mouth daily.     No current facility-administered medications for this visit.   Facility-Administered Medications Ordered in Other Visits  Medication Dose Route Frequency Provider Last Rate Last Admin   0.9 %  sodium chloride infusion   Intravenous Once  Alvy Bimler, Quanta Robertshaw, MD        PHYSICAL EXAMINATION: ECOG PERFORMANCE STATUS: 1 - Symptomatic but completely ambulatory  Vitals:   03/09/21 0816  BP: 92/60  Pulse: 62  Resp: 18  Temp: 98 F (36.7 C)  SpO2: 100%   Filed Weights   03/09/21 0816  Weight: 110 lb 3.2 oz (50 kg)    GENERAL:alert, no distress and comfortable.  She looks thin and frail SKIN: She has laceration on her forehead and multiple bruises EYES: normal, Conjunctiva are pink and non-injected, sclera  clear OROPHARYNX:no exudate, no erythema and lips, buccal mucosa, and tongue normal  NECK: supple, thyroid normal size, non-tender, without nodularity LYMPH:  no palpable lymphadenopathy in the cervical, axillary or inguinal LUNGS: clear to auscultation and percussion with normal breathing effort HEART: regular rate & rhythm and no murmurs and no lower extremity edema ABDOMEN:abdomen soft, non-tender and normal bowel sounds Musculoskeletal:no cyanosis of digits and no clubbing  NEURO: alert & oriented x 3 with fluent speech, no focal motor/sensory deficits  LABORATORY DATA:  I have reviewed the data as listed    Component Value Date/Time   NA 128 (L) 03/09/2021 0747   NA 143 07/14/2017 1245   K 3.9 03/09/2021 0747   K 3.3 (L) 07/14/2017 1245   CL 95 (L) 03/09/2021 0747   CO2 23 03/09/2021 0747   CO2 20 (L) 07/14/2017 1245   GLUCOSE 83 03/09/2021 0747   GLUCOSE 106 07/14/2017 1245   BUN 12 03/09/2021 0747   BUN 9.3 07/14/2017 1245   CREATININE 1.01 (H) 03/09/2021 0747   CREATININE 1.01 (H) 01/27/2021 0847   CREATININE 0.8 07/14/2017 1245   CALCIUM 9.5 03/09/2021 0747   CALCIUM 8.4 07/14/2017 1245   PROT 6.8 03/09/2021 0747   PROT 6.0 07/14/2017 1245   PROT 6.2 (L) 07/14/2017 1245   ALBUMIN 3.8 03/09/2021 0747   ALBUMIN 3.6 07/14/2017 1245   AST 18 03/09/2021 0747   AST 21 01/27/2021 0847   AST 21 07/14/2017 1245   ALT 12 03/09/2021 0747   ALT 14 01/27/2021 0847   ALT 21 07/14/2017 1245   ALKPHOS 55  03/09/2021 0747   ALKPHOS 78 07/14/2017 1245   BILITOT 0.7 03/09/2021 0747   BILITOT 0.6 01/27/2021 0847   BILITOT 0.43 07/14/2017 1245   GFRNONAA 60 (L) 03/09/2021 0747   GFRNONAA 60 (L) 01/27/2021 0847   GFRAA 57 (L) 04/21/2020 0817   GFRAA >60 01/14/2020 0828    No results found for: SPEP, UPEP  Lab Results  Component Value Date   WBC 4.9 03/09/2021   NEUTROABS PENDING 03/09/2021   HGB 11.2 (L) 03/09/2021   HCT 32.9 (L) 03/09/2021   MCV 79.5 (L) 03/09/2021   PLT 210 03/09/2021      Chemistry      Component Value Date/Time   NA 128 (L) 03/09/2021 0747   NA 143 07/14/2017 1245   K 3.9 03/09/2021 0747   K 3.3 (L) 07/14/2017 1245   CL 95 (L) 03/09/2021 0747   CO2 23 03/09/2021 0747   CO2 20 (L) 07/14/2017 1245   BUN 12 03/09/2021 0747   BUN 9.3 07/14/2017 1245   CREATININE 1.01 (H) 03/09/2021 0747   CREATININE 1.01 (H) 01/27/2021 0847   CREATININE 0.8 07/14/2017 1245      Component Value Date/Time   CALCIUM 9.5 03/09/2021 0747   CALCIUM 8.4 07/14/2017 1245   ALKPHOS 55 03/09/2021 0747   ALKPHOS 78 07/14/2017 1245   AST 18 03/09/2021 0747   AST 21 01/27/2021 0847   AST 21 07/14/2017 1245   ALT 12 03/09/2021 0747   ALT 14 01/27/2021 0847   ALT 21 07/14/2017 1245   BILITOT 0.7 03/09/2021 0747   BILITOT 0.6 01/27/2021 0847   BILITOT 0.43 07/14/2017 1245       RADIOGRAPHIC STUDIES: I have reviewed imaging study with the patient and her husband I have personally reviewed the radiological images as listed and agreed with the findings in the report. DG Bone Survey Met  Result Date: 03/05/2021 CLINICAL DATA:  Myeloma staging. EXAM: METASTATIC BONE SURVEY COMPARISON:  PET-CT 04/20/2019. CT 07/13/2016. Chest x-ray 07/12/2016. Head CT 06/06/2014. FINDINGS: Images of the axial and appendicular skeleton performed. Diffuse osteopenia. Stable deformity left ninth rib. Diffuse mild degenerative change cervicothoracic and thoracolumbar spine. Prior craniotomy. Two faint  lucencies noted over the posterior skull. 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 No focal abnormality noted to suggest myeloma or metastatic disease. IMPRESSION: 1. No focal abnormality noted to suggest myeloma or metastatic disease. 2.  Diffuse osteopenia. Electronically Signed   By: Marcello Moores  Register   On: 03/05/2021 09:28

## 2021-03-09 NOTE — ED Provider Notes (Signed)
[place] DEPT Provider Note   CSN: [id number] Arrival date & time: 03/09/21  0848     History Chief Complaint  Patient presents with   Dizziness   Fall    Jessica [name] is a 70 y.o. female.  HPI She presents for evaluation of injuries from fall this morning.  She stood up to go to the bathroom, felt dizzy and fell forward striking her head and hands on the floor and injuring her back.  She was unable to get up alone but did get up and walk after assistance by her husband.  She did not take her usual morning medicines.  She went to see her oncologist for previously scheduled appointment to get her multiple myeloma checked on.  Her oncologist recommended she come to the ED for evaluation.  Patient has had ongoing symptoms of nausea and vomiting, with dizziness on standing.  Her oncologist has referred her to GI, cardiology and physical therapy, as of this morning.  Unknown last tetanus vaccine.  There are no other known modifying factors.    Past Medical History:  Diagnosis Date   H/O stem cell transplant [facility]) 03/2015   [facility]   Multiple myeloma [facility] 11/19/2014   Multiple myeloma ([place])    Multiple myeloma (HCC)    MVP (mitral valve prolapse)    req prophylaxis   Other constipation 12/03/2014   Right ovarian cyst 10/16/15   16 mm simple cyst - ultrasound yearly.   Upper respiratory infection, acute 08/12/2015    Patient Active Problem List   Diagnosis Date Noted   Fall against sharp object, initial encounter 03/09/2021   Deficiency anemia 02/24/2021   Vitamin B12 deficiency 01/27/2021   Nausea with vomiting 12/31/2020   Physical debility 10/06/2020   Irritation of right eye 09/08/2020   Immunocompromised state due to drug therapy ([place]) 05/19/2020   Preventive measure 04/21/2020   Alopecia 08/21/2019   Hypomagnesemia 04/25/2019   Right sided temporal headache 04/13/2019   Goals of care, counseling/discussion 04/13/2019   Hypocalcemia  01/11/2019   Osteopenia 06/05/2018   Weight loss 04/14/2018   Elevated blood pressure reading in office without diagnosis of hypertension 01/12/2018   Diarrhea 10/13/2017   Postural dizziness 10/13/2017   Right ovarian cyst 05/13/2017   Chronic GERD 01/13/2017   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 08/10/2016   Hypokalemia 07/16/2016   Pneumonia 07/13/2016   Atypical chest pain 07/13/2016   Chest pain on breathing 07/12/2016   Mucositis due to antineoplastic therapy 07/09/2016   Candidal esophagitis ([place]) 04/30/2016   Herpes simplex 04/30/2016   Chronic kidney disease, stage III (moderate) ([place]) 03/12/2016   Hypokalemia, gastrointestinal losses 01/16/2016   Pancytopenia, acquired ([place]) 10/20/2015   Nasal congestion 10/20/2015   Varicose vein of leg 08/25/2015   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   Hypotension due to drugs 04/22/2015   Petechiae 02/14/2015   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   Essential hypertension 02/07/2015   Infection of eyelid 01/16/2015   Protein calorie malnutrition ([place]) 01/16/2015   Superficial thrombophlebitis of upper extremity 12/24/2014   Sty, external 12/24/2014   Other constipation 12/03/2014   Dehydration 12/02/2014   Weakness 12/02/2014   Multiple myeloma in relapse ([place]) 11/19/2014   Prerenal renal failure 11/19/2014   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[place]) 11/19/2014   Neuropathic pain of left flank 11/19/2014    Past Surgical History:  Procedure Laterality Date   CATARACT EXTRACTION, BILATERAL  10/2010  Implants(ReSTOR) Bilat   LASER ABLATION OF CONDYLOMAS  1990   CIN 1 cervix   multiple myeloma surgery Right 08/2014   [facility]     OB History     Gravida  1   Para  1   Term      Preterm      AB      Living  1      SAB      IAB      Ectopic      Multiple      Live Births              Family History  Problem Relation Age of Onset   Osteoporosis  Mother    Diabetes Father     Social History   Tobacco Use   Smoking status: Never   Smokeless tobacco: Never  Vaping Use   Vaping Use: Never used  Substance Use Topics   Alcohol use: No    Alcohol/week: 0.0 standard drinks   Drug use: No    Home Medications Prior to Admission medications   Medication Sig Start Date End Date Taking? Authorizing Provider  acetaminophen (TYLENOL) 500 MG tablet Take 500-1,000 mg by mouth every 6 (six) hours as needed for mild pain, moderate pain, fever or headache.    [provider]  acyclovir (ZOVIRAX) 400 MG tablet TAKE 1 TABLET BY MOUTH TWICE DAILY 08/12/20   [name], MD  aspirin EC 81 MG tablet Take 81 mg by mouth daily with breakfast.    [provider]  calcium carbonate (OSCAL) 1500 (600 Ca) MG TABS tablet Take 1,500 mg by mouth daily with breakfast.    [provider]  Calcium Carbonate 500 MG CHEW Chew 500 mg by mouth as needed for indigestion.    [provider]  Cholecalciferol (VITAMIN D3) 2000 units capsule Take 2,000 Units by mouth daily.     [provider]  famotidine (PEPCID) 20 MG tablet Take 20 mg by mouth daily. 12/27/19   [provider]  fluorometholone (FML) 0.1 % ophthalmic suspension Place 1 drop into both eyes 3 (three) times daily. 09/16/20   [provider]  loratadine (CLARITIN) 10 MG tablet Take 10 mg by mouth daily.    [provider]  Multiple Vitamins-Minerals (CENTRUM SILVER PO) Take 1 tablet by mouth daily.     [provider]  omeprazole (PRILOSEC) 20 MG capsule TAKE 1 CAPSULE(20 MG) BY MOUTH DAILY 02/10/21   Alvy Bimler, Ni, MD  ondansetron (ZOFRAN) 8 MG tablet TAKE 1 TABLET(8 MG) BY MOUTH EVERY 8 HOURS AS NEEDED FOR NAUSEA OR VOMITING 02/23/21   [name], MD  pomalidomide (POMALYST) 2 MG capsule Take 1 pill daily for 14 days then rest 7 days for cycle of every 21 days 02/24/21   [name], MD  senna-docusate (SENOKOT-S) 8.6-50 MG tablet  Take 1-2 tablets by mouth daily as needed for mild constipation or moderate constipation.  08/20/14   [provider]  sodium chloride (OCEAN) 0.65 % SOLN nasal spray Place 1 spray into both nostrils as needed for congestion.    [provider]  triamcinolone ointment (KENALOG) 0.1 % Apply 1 application topically daily. 09/16/20   [provider]  triamterene-hydrochlorothiazide (MAXZIDE-25) 37.5-25 MG tablet Take 0.5 tablets by mouth daily. 12/04/19   [provider]  vitamin B-12 (CYANOCOBALAMIN) 500 MCG tablet Take 500 mcg by mouth daily.    [provider]    Allergies  Augmentin [amoxicillin-pot clavulanate], Albuterol, Codeine, Ibuprofen, Nsaids, and Tolmetin  Review of Systems   Review of Systems  All other systems reviewed and are negative.  Physical Exam Updated Vital Signs BP 134/80   Pulse (!) 51   Temp 97.6 F (36.4 C) (Oral)   Resp 16   LMP 08/10/2003   SpO2 100%   Physical Exam Vitals and nursing note reviewed.  Constitutional:      General: She is not in acute distress.    Appearance: She is well-developed. She is not ill-appearing, toxic-appearing or diaphoretic.  HENT:     Head: Normocephalic.     Comments: Scalp laceration, right frontal, gaping but not bleeding.  No associated crepitation or deformity.    Right Ear: External ear normal.     Left Ear: External ear normal.     Nose: No congestion or rhinorrhea.     Mouth/Throat:     Mouth: Mucous membranes are moist.  Eyes:     Conjunctiva/sclera: Conjunctivae normal.     Pupils: Pupils are equal, round, and reactive to light.  Neck:     Trachea: Phonation normal.  Cardiovascular:     Rate and Rhythm: Normal rate and regular rhythm.     Heart sounds: Normal heart sounds.  Pulmonary:     Effort: Pulmonary effort is normal.     Breath sounds: Normal breath sounds.  Chest:     Chest wall: Tenderness (Right lateral lower, without crepitation or deformity)  present.  Abdominal:     General: There is no distension.     Palpations: Abdomen is soft.     Tenderness: There is no abdominal tenderness.  Musculoskeletal:        General: Normal range of motion.     Cervical back: Normal range of motion and neck supple.     Comments: Contusions, radial aspect of hands beneath thumbs, without significant swelling or altered motion.  No large joint deformities.  Normal range of motion arms and legs.  Skin:    General: Skin is warm and dry.  Neurological:     Mental Status: She is alert and oriented to person, place, and time.     Cranial Nerves: No cranial nerve deficit.     Sensory: No sensory deficit.     Motor: No abnormal muscle tone.     Coordination: Coordination normal.  Psychiatric:        Mood and Affect: Mood normal.        Behavior: Behavior normal.        Thought Content: Thought content normal.        Judgment: Judgment normal.    ED Results / Procedures / Treatments   Labs (all labs ordered are listed, but only abnormal results are displayed) Labs Reviewed  COMPREHENSIVE METABOLIC PANEL - Abnormal; Notable for the following components:      Result Value   Sodium 125 (*)    Chloride 93 (*)    All other components within normal limits  CBC WITH DIFFERENTIAL/PLATELET - Abnormal; Notable for the following components:   Hemoglobin 11.6 (*)    HCT 34.9 (*)    Abs Immature Granulocytes 0.10 (*)    All other components within normal limits  URINALYSIS, ROUTINE W REFLEX MICROSCOPIC - Abnormal; Notable for the following components:   APPearance HAZY (*)    Hgb urine dipstick SMALL (*)    Bacteria, UA RARE (*)    All other components within normal limits    EKG None  Radiology DG Ribs Unilateral W/Chest Right  Result Date: 03/09/2021 CLINICAL DATA:  Fall, pain EXAM: RIGHT RIBS AND CHEST - 3+ VIEW COMPARISON:  Chest radiographs, 03/30/2019, CT chest, 07/13/2016 FINDINGS: No acute displaced fracture or other bone lesions are seen  involving the ribs. There is a chronic, sclerotic fracture deformity of the posterior left ninth rib, unchanged compared to priors. There is no evidence of pneumothorax or pleural effusion. Both lungs are clear. Heart size and mediastinal contours are within normal limits. IMPRESSION: 1. No acute displaced fracture of the right ribs.  No pneumothorax. 2. Chronic, sclerotic fracture deformity of the posterior left ninth rib, unchanged compared to priors. Electronically Signed   By: Eddie Candle M.D.   On: 03/09/2021 10:58   CT Head Wo Contrast  Result Date: 03/09/2021 CLINICAL DATA:  Fall EXAM: CT HEAD WITHOUT CONTRAST TECHNIQUE: Contiguous axial images were obtained from the base of the skull through the vertex without intravenous contrast. COMPARISON:  06/06/2014 FINDINGS: Brain: There is atrophy and chronic small vessel disease changes. No acute intracranial abnormality. Specifically, no hemorrhage, hydrocephalus, mass lesion, acute infarction, or significant intracranial injury. Vascular: No hyperdense vessel or unexpected calcification. Skull: No acute calvarial abnormality. Prior right frontal craniotomy Sinuses/Orbits: No acute abnormality. Other: None IMPRESSION: Atrophy, chronic microvascular disease. No acute intracranial abnormality. Electronically Signed   By: Rolm Baptise M.D.   On: 03/09/2021 10:43   CT Cervical Spine Wo Contrast  Result Date: 03/09/2021 CLINICAL DATA:  Fall EXAM: CT CERVICAL SPINE WITHOUT CONTRAST TECHNIQUE: Multidetector CT imaging of the cervical spine was performed without intravenous contrast. Multiplanar CT image reconstructions were also generated. COMPARISON:  None. FINDINGS: Alignment: Normal Skull base and vertebrae: No acute fracture. No primary bone lesion or focal pathologic process. Soft tissues and spinal canal: No prevertebral fluid or swelling. No visible canal hematoma. Disc levels: Degenerative disc disease at C5-6 and C6-7 with disc space narrowing and  spurring. Upper chest: No acute findings Other: None IMPRESSION: No acute bony abnormality. Electronically Signed   By: Rolm Baptise M.D.   On: 03/09/2021 10:44    Procedures .Marland KitchenLaceration Repair  Date/Time: 03/09/2021 12:45 PM Performed by: Daleen Bo, MD Authorized by: Daleen Bo, MD   Consent:    Consent obtained:  Verbal   Risks discussed:  Infection and pain Universal protocol:    Patient identity confirmed:  Verbally with patient Anesthesia:    Anesthesia method:  Local infiltration   Local anesthetic:  Lidocaine 2% WITH epi Laceration details:    Location:  Scalp   Scalp location:  Frontal   Length (cm):  2.3   Depth (mm):  11 Pre-procedure details:    Preparation:  Patient was prepped and draped in usual sterile fashion Exploration:    Hemostasis achieved with:  Direct pressure   Wound extent: no areolar tissue violation noted, no fascia violation noted, no foreign bodies/material noted, no muscle damage noted, no nerve damage noted, no underlying fracture noted and no vascular damage noted     Contaminated: no   Treatment:    Area cleansed with:  Chlorhexidine   Amount of cleaning:  Standard   Irrigation solution:  Sterile saline   Irrigation method:  Pressure wash   Visualized foreign bodies/material removed: no     Debridement:  None   Undermining:  None Skin repair:    Repair method:  Sutures   Suture size:  4-0   Suture material:  Prolene   Suture technique:  Simple interrupted   Number  of sutures:  3 Approximation:    Approximation:  Loose Repair type:    Repair type:  Simple Post-procedure details:    Dressing:  Antibiotic ointment   Procedure completion:  Tolerated well, no immediate complications   Medications Ordered in ED Medications  lidocaine-EPINEPHrine (XYLOCAINE W/EPI) 2 %-1:200000 (PF) injection 10 mL (has no administration in time range)  Tdap (BOOSTRIX) injection 0.5 mL (0.5 mLs Intramuscular Given 03/09/21 1038)  sodium chloride 0.9  % bolus 500 mL (0 mLs Intravenous Stopped 03/09/21 1211)    ED Course  I have reviewed the triage vital signs and the nursing notes.  Pertinent labs & imaging results that were available during my care of the patient were reviewed by me and considered in my medical decision making (see chart for details).    MDM Rules/Calculators/A&P                            Patient Vitals for the past 24 hrs:  BP Temp Temp src Pulse Resp SpO2  03/09/21 1255 134/80 97.6 F (36.4 C) Oral (!) 51 16 100 %  03/09/21 1200 (!) 150/126 -- -- (!) 50 18 100 %  03/09/21 1130 139/81 -- -- (!) 50 18 99 %  03/09/21 1045 123/85 -- -- (!) 52 18 100 %  03/09/21 1041 130/61 -- -- (!) 54 18 100 %  03/09/21 0918 (!) 121/99 (!) 97.4 F (36.3 C) Oral (!) 52 18 100 %  03/09/21 0909 -- -- -- -- -- 100 %    12:49 PM Reevaluation with update and discussion. After initial assessment and treatment, an updated evaluation reveals she is comfortable after suturing, and has no further complaints.  Findings discussed and questions answered.  Husband at bedside during discussion. Daleen Bo   Medical Decision Making:  This patient is presenting for evaluation of weakness, fall, head injury and multiple contusions, which does require a range of treatment options, and is a complaint that involves a high risk of morbidity and mortality. The differential diagnoses include contusion, head injury, laceration, occult infection and metabolic disorders, volume depletion. I decided to review old records, and in summary elderly female, fell today because she was dizzy when walking.  No loss of consciousness.  Injury to head.  She is being treated for multiple myeloma and has ongoing dizziness, which was addressed this morning by her oncologist when she saw the patient.  Patient has been referred to cardiology, GI and physical therapy by oncology.  I obtain additional historical information from husband at bedside.  Clinical Laboratory  Tests Ordered, included CBC, Metabolic panel, and Urinalysis. Review indicates normal except for sodium slightly low, chloride slightly low. Radiologic Tests Ordered, included CT head and cervical spine, chest x-ray and right rib detail.  I independently Visualized: Radiographic images, which show no acute abnormalities  Orthostatic blood pressure and pulse-normal  Critical Interventions-clinical evaluation, laboratory testing, IV fluids, observation, laceration repair, discussion with patient and husband  After These Interventions, the Patient was reevaluated and was found stable for discharge.  Patient with multiple contusions, no serious injury.  Doubt bony fracture, or serious intracranial injury.  No evidence of spine injury.  Patient with chronic disability with weakness and periods of vomiting.  Metabolically stable at this time.  No evidence of hemodynamic instability.  Recommending symptomatic treatment and follow-up as directed previously.  CRITICAL CARE-yes Performed by: Daleen Bo  Nursing Notes Reviewed/ Care Coordinated Applicable Imaging Reviewed Interpretation of  Laboratory Data incorporated into ED treatment  The patient appears reasonably screened and/or stabilized for discharge and I doubt any other medical condition or other [facility] requiring further screening, evaluation, or treatment in the ED at this time prior to discharge.  Plan: Home Medications-continue usual; Home Treatments-correct advance diet and activity; return here if the recommended treatment, does not improve the symptoms; Recommended follow up-PCP, suture removal in 7 days, referrals as previously planned by Dr. Alvy Bimler     Final Clinical Impression(s) / ED Diagnoses Final diagnoses:  Injury of head, initial encounter  Fall, initial encounter  Dizziness  Contusion, multiple sites  Laceration of occipital region of scalp, initial encounter    Rx / DC Orders ED Discharge Orders     None         Daleen Bo, MD 03/09/21 1257

## 2021-03-09 NOTE — Assessment & Plan Note (Signed)
She has significant postural dizziness causing her syncopal episode and falls at home I will refer her to cardiologist for evaluation

## 2021-03-09 NOTE — Discharge Instructions (Addendum)
Make sure you are getting plenty of rest and drinking a lot of fluids.  Try to eat a regular diet.  Clean the scalp wound daily with soap and water.  Follow instructions by Dr. Alvy Bimler including stopping your chemotherapy medication and following up with the consultants as she has recommended.  Return here, if needed, for problems.

## 2021-03-09 NOTE — ED Notes (Signed)
Pt in CT.

## 2021-03-09 NOTE — Assessment & Plan Note (Signed)
She has laceration on the forehead and several bruises on her body Her recent bone density scan show significant osteopenia and recent skeletal survey showed evidence of diffuse osteopenia I am concerned she might have a bone fracture Recommend ER evaluation I will refer her to cardiologist for evaluation of postural dizziness and hypotension that caused her recent fall She would also benefit from physical therapy and rehab after evaluation

## 2021-03-09 NOTE — Assessment & Plan Note (Signed)
She just started the addition of Pomalyst I do not believe it is the cause of her excessive nausea, postural dizziness and fall but I recommend she stop taking Pomalyst until further evaluation I reviewed recent bone skeletal survey results with the patient and her husband, no lesions are found I am concerned about her wellbeing With her significant injuries seen on exam, I recommend ER evaluation for her fall and she is in agreement

## 2021-03-09 NOTE — ED Triage Notes (Signed)
Pt BIB POV. Patient husband states that she fell this morning at 0630. The patient states that she normally feels dizzy due to treatments however today when she woke up she felt "extra dizzy and fell. Patient did hit her head this morning when she fell.

## 2021-03-09 NOTE — Assessment & Plan Note (Signed)
She has significant nausea and vomiting which cannot be explained by her medication I recommend GI evaluation

## 2021-03-10 ENCOUNTER — Telehealth: Payer: Self-pay | Admitting: *Deleted

## 2021-03-10 ENCOUNTER — Telehealth: Payer: Self-pay | Admitting: Hematology and Oncology

## 2021-03-10 LAB — KAPPA/LAMBDA LIGHT CHAINS
Kappa free light chain: 24.5 mg/L — ABNORMAL HIGH (ref 3.3–19.4)
Kappa, lambda light chain ratio: 8.75 — ABNORMAL HIGH (ref 0.26–1.65)
Lambda free light chains: 2.8 mg/L — ABNORMAL LOW (ref 5.7–26.3)

## 2021-03-10 NOTE — Telephone Encounter (Signed)
Per Dr. Alvy Bimler request - Contacted [facility]. Per Pearson Grippe, patient saw Dr.  Wynetta Emery in past - he has now retired. Referral information given to [facility] per Dr. Alvy Bimler referral: R11.2 (ICD-10-CM) - Non-intractable vomiting with nausea, unspecified vomiting type. Pearson Grippe offered patient appt with Dr. Watt Climes tomorrow 03/11/21 at 1515 at [place] office.  Referral faxed to [phone] w/most recent OV;labs;med list;demographics as requested. Fax confirmation received.  Contacted patient to inquire how she was feeling today. Patient states she is sore all over from fall.   Advised patient per Dr. Alvy Bimler to resume Pomalyst and that she will need a lab appt next week - Scheduling will be contacting her. She verbalized understanding.   Advised patent that Dr. Alvy Bimler had referred her to GI and that they had appt available with Dr. Watt Climes tomorrow 03/11/21 at 1515 - arrive at office 1500. Patient states she will go to appt. Gave her office location and encouraged her to contact this office if she has further questions or concerns.   Patient then stated she has developed a pink area on inside of both arms from antecubital area to shoulder yesterday - not raised or bumps, but does itch. Dr. Alvy Bimler informed.

## 2021-03-10 NOTE — Telephone Encounter (Deleted)
-----   Message from [name], MD sent at 03/10/2021  8:43 AM EDT ----- Hi,  Can you call and ask how she is feeling? Can you help figure out who is her gastro physician at [facility] and make a referral? I placed order for referral but not sure who to send it to I also recommend she resume taking pomalyst and scheduler will call for appt next week to check on her labs   Thanks

## 2021-03-10 NOTE — Telephone Encounter (Signed)
Scheduled appts per 8/2 sch msg. Pt aware.

## 2021-03-10 NOTE — Telephone Encounter (Signed)
She also stated she has developed a pink area on inside of both arms from antecubital area to shoulder yesterday - not raised or bumps, but does itch at times. Per Dr. Alvy Bimler, called patient with following response - "I examined her yesterday, lots of bruises. I will see her next week as scheduled" Encouraged patient to contact office if redness increased or if itching became worse. Patient verbalized understanding

## 2021-03-11 ENCOUNTER — Other Ambulatory Visit: Payer: Self-pay | Admitting: Hematology and Oncology

## 2021-03-11 ENCOUNTER — Telehealth: Payer: Self-pay

## 2021-03-11 DIAGNOSIS — L270 Generalized skin eruption due to drugs and medicaments taken internally: Secondary | ICD-10-CM | POA: Insufficient documentation

## 2021-03-11 DIAGNOSIS — C9002 Multiple myeloma in relapse: Secondary | ICD-10-CM

## 2021-03-11 LAB — MULTIPLE MYELOMA PANEL, SERUM
Albumin SerPl Elph-Mcnc: 3.8 g/dL (ref 2.9–4.4)
Albumin/Glob SerPl: 1.5 (ref 0.7–1.7)
Alpha 1: 0.2 g/dL (ref 0.0–0.4)
Alpha2 Glob SerPl Elph-Mcnc: 0.9 g/dL (ref 0.4–1.0)
B-Globulin SerPl Elph-Mcnc: 0.9 g/dL (ref 0.7–1.3)
Gamma Glob SerPl Elph-Mcnc: 0.7 g/dL (ref 0.4–1.8)
Globulin, Total: 2.7 g/dL (ref 2.2–3.9)
IgA: 7 mg/dL — ABNORMAL LOW (ref 64–422)
IgG (Immunoglobin G), Serum: 774 mg/dL (ref 586–1602)
IgM (Immunoglobulin M), Srm: <5 mg/dL — ABNORMAL LOW (ref 26–217)
M Protein SerPl Elph-Mcnc: 0.5 g/dL — ABNORMAL HIGH
Total Protein ELP: 6.5 g/dL (ref 6.0–8.5)

## 2021-03-11 MED ORDER — PREDNISONE 20 MG PO TABS: 20.0000 mg | ORAL_TABLET | Freq: Every day | ORAL | 0 refills | Status: DC

## 2021-03-11 NOTE — Telephone Encounter (Addendum)
Returned her call. She has a rash all over her body now except for her face. Itching at rash when it appears on her skin. Denies scratching. Has not taken anything for the rash. She said the rash is pink and looks like the  measles. She is having a hard time describing.  She has a 3 pm today with GI. She had nausea this morning and took her medication.

## 2021-03-11 NOTE — Telephone Encounter (Signed)
Called and given below message. She verbalized understanding. Appt scheduled for Friday at 11am. She is aware of appt.

## 2021-03-11 NOTE — Telephone Encounter (Signed)
OK, let's stop Pomalyst, take pictures I will send prescription for prednisone 20 mg to pharmacy, take one with food right away but start tomorrow take with breakfast and schedule appt with me on Friday, 20 mins no need labs

## 2021-03-13 ENCOUNTER — Inpatient Hospital Stay (HOSPITAL_BASED_OUTPATIENT_CLINIC_OR_DEPARTMENT_OTHER): Payer: Medicare Other | Admitting: Hematology and Oncology

## 2021-03-13 ENCOUNTER — Other Ambulatory Visit: Payer: Self-pay

## 2021-03-13 DIAGNOSIS — M8580 Other specified disorders of bone density and structure, unspecified site: Secondary | ICD-10-CM | POA: Diagnosis not present

## 2021-03-13 DIAGNOSIS — Z880 Allergy status to penicillin: Secondary | ICD-10-CM | POA: Diagnosis not present

## 2021-03-13 DIAGNOSIS — T451X5A Adverse effect of antineoplastic and immunosuppressive drugs, initial encounter: Secondary | ICD-10-CM | POA: Diagnosis not present

## 2021-03-13 DIAGNOSIS — G620 Drug-induced polyneuropathy: Secondary | ICD-10-CM | POA: Diagnosis not present

## 2021-03-13 DIAGNOSIS — Z885 Allergy status to narcotic agent status: Secondary | ICD-10-CM | POA: Diagnosis not present

## 2021-03-13 DIAGNOSIS — R0781 Pleurodynia: Secondary | ICD-10-CM | POA: Diagnosis not present

## 2021-03-13 DIAGNOSIS — Z79899 Other long term (current) drug therapy: Secondary | ICD-10-CM | POA: Diagnosis not present

## 2021-03-13 DIAGNOSIS — Z886 Allergy status to analgesic agent status: Secondary | ICD-10-CM | POA: Diagnosis not present

## 2021-03-13 DIAGNOSIS — R42 Dizziness and giddiness: Secondary | ICD-10-CM | POA: Diagnosis not present

## 2021-03-13 DIAGNOSIS — R112 Nausea with vomiting, unspecified: Secondary | ICD-10-CM | POA: Diagnosis not present

## 2021-03-13 DIAGNOSIS — R55 Syncope and collapse: Secondary | ICD-10-CM | POA: Diagnosis not present

## 2021-03-13 DIAGNOSIS — Z5112 Encounter for antineoplastic immunotherapy: Secondary | ICD-10-CM | POA: Diagnosis present

## 2021-03-13 DIAGNOSIS — L270 Generalized skin eruption due to drugs and medicaments taken internally: Secondary | ICD-10-CM | POA: Diagnosis not present

## 2021-03-13 DIAGNOSIS — Z887 Allergy status to serum and vaccine status: Secondary | ICD-10-CM | POA: Diagnosis not present

## 2021-03-13 DIAGNOSIS — Z881 Allergy status to other antibiotic agents status: Secondary | ICD-10-CM | POA: Diagnosis not present

## 2021-03-13 DIAGNOSIS — M50322 Other cervical disc degeneration at C5-C6 level: Secondary | ICD-10-CM | POA: Diagnosis not present

## 2021-03-13 DIAGNOSIS — J984 Other disorders of lung: Secondary | ICD-10-CM | POA: Diagnosis not present

## 2021-03-13 DIAGNOSIS — Z9484 Stem cells transplant status: Secondary | ICD-10-CM | POA: Diagnosis not present

## 2021-03-13 DIAGNOSIS — C9002 Multiple myeloma in relapse: Secondary | ICD-10-CM | POA: Diagnosis present

## 2021-03-13 DIAGNOSIS — I6782 Cerebral ischemia: Secondary | ICD-10-CM | POA: Diagnosis not present

## 2021-03-13 MED ORDER — SULFAMETHOXAZOLE-TRIMETHOPRIM 800-160 MG PO TABS: 1.0000 | ORAL_TABLET | Freq: Two times a day (BID) | ORAL | 0 refills | Status: DC

## 2021-03-15 ENCOUNTER — Encounter: Payer: Self-pay | Admitting: Hematology and Oncology

## 2021-03-15 NOTE — Assessment & Plan Note (Signed)
She has significant cellulitis in her arm at the site of tetanus injection and petechiae rash elsewhere She will continue prednisone therapy I will give her a course of antibiotics for cellulitis

## 2021-03-15 NOTE — Progress Notes (Signed)
[place] OFFICE PROGRESS NOTE  Patient Care Team: Josetta Huddle, MD as PCP - General (Internal Medicine) Ginette Pitman, MD as Consulting Physician (Hematology and Oncology) Hessie Dibble, MD as Consulting Physician (Hematology and Oncology)  ASSESSMENT & PLAN:  Multiple myeloma in relapse [facility]) Her rash is not consistent with reaction to Pomalyst I suspect she is allergic to recent tetanus injection I recommend she hold Pomalyst until further evaluation next week  Drug-induced skin rash She has significant cellulitis in her arm at the site of tetanus injection and petechiae rash elsewhere She will continue prednisone therapy I will give her a course of antibiotics for cellulitis  Nausea with vomiting I have reviewed documentation from gastroenterologist Her nausea is not consistent with side effects of chemotherapy I recommend we proceed with upper endoscopy evaluation to rule out stomach ulcer or gastritis  No orders of the defined types were placed in this encounter.   All questions were answered. The patient knows to call the clinic with any problems, questions or concerns. The total time spent in the appointment was 20 minutes encounter with patients including review of chart and various tests results, discussions about plan of care and coordination of care plan   [name], MD 03/15/2021 12:44 PM  INTERVAL HISTORY: Please see below for problem oriented charting. She is seen with her husband for further follow-up She had significant rash since her recent ER visit The rash on her other side of the body is itchy but the rash near the site of her tetanus injection was very red, swollen and painful She was sent to the emergency department due to recent fall She continues to have intermittent nausea I have reviewed documentation from gastroenterologist dated 03/11/2021  SUMMARY OF ONCOLOGIC HISTORY: Oncology History Overview Note   M-protein 0.69 gm/dl  IFIX - IgG, Kappa IgG - 868 IgA - 19 IgM - < 20 Kappa - 21 Lambda - 5.7  09/06/2014 - Bone marrow aspirate and biopsy:   Normocellular marrow for age (40%) with a small monoclonal plasma cell population (1% on aspirate). Karyotype 84, XX  FISH Negative for myeloma associated changes  09/12/2014 - PET/CT  Two regions that are concerning for disease, one adjacent/involving the left ninth rib and one in the marrow of the right femur, in this patient with history of plasmacytoma.    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    04/01/2015  Bone Marrow Transplant   She received melphalan chemotherapy on 03/31/2015 followed by autologous stem cell transplant the day after    04/03/2015 - 04/18/2015 Hospital Admission   The patient was admitted to the hospital at [facility] for management related to complication from stem cell transplant. She had significant nausea requiring intravenous anti-emetics.    07/17/2015 -  Chemotherapy   She started  maintenance Revlimid and monthly zometa, then every 3 months   06/01/2018 Imaging   DEXA scan showed bone density T score in femur -2.3    04/20/2019 PET scan   1. No FDG avid osseous lesions or mass identified to suggest metabolically active lesion of myeloma or plasmacytoma. 2. Small nodular density within the paravertebral right lower lobe exhibits mild to moderate increased uptake within SUV max of 3.38. This is indeterminate. Review of CT chest from 10/05/2018 shows a corresponding Lung nodule in this area measuring the same. Small pulmonary neoplasm cannot be excluded. 3. Indeterminate, focal area of increased uptake is identified within the thoracic canal. Indeterminate favored to represent benign physiologic CNS activity.     04/30/2019 -  Chemotherapy      Patient is on Antibody Plan: MYELOMA SQ DARATUMUMAB FASPRO Q28D     04/30/2019 - 07/09/2019 Chemotherapy   The patient had bortezomib SQ (VELCADE) chemo injection 1.75 mg, 1.3 mg/m2 = 1.75 mg, Subcutaneous,  Once, 10 of 11 cycles Administration: 1.75 mg (04/30/2019), 1.75 mg (05/07/2019), 1.75 mg (05/14/2019), 1.75 mg (05/21/2019), 1.75 mg (05/28/2019), 1.75 mg (06/04/2019), 1.75 mg (06/11/2019), 1.75 mg (06/18/2019), 1.75 mg (06/25/2019), 1.75 mg (07/09/2019)   for chemotherapy treatment.       REVIEW OF SYSTEMS:   Constitutional: Denies fevers, chills or abnormal weight loss Eyes: Denies blurriness of vision Ears, nose, mouth, throat, and face: Denies mucositis or sore throat Respiratory: Denies cough, dyspnea or wheezes Cardiovascular: Denies palpitation, chest discomfort or lower extremity swelling Lymphatics: Denies new lymphadenopathy or easy bruising Neurological:Denies numbness, tingling or new weaknesses Behavioral/Psych: Mood is stable, no new changes  All other systems were reviewed with the patient and are negative.  I have reviewed the past medical history, past surgical history, social history and family  history with the patient and they are unchanged from previous note.  ALLERGIES:  is allergic to augmentin [amoxicillin-pot clavulanate], albuterol, codeine, ibuprofen, nsaids, tolmetin, and tetanus-diphth-acell pertussis.  MEDICATIONS:  Current Outpatient Medications  Medication Sig Dispense Refill   sulfamethoxazole-trimethoprim (BACTRIM DS) 800-160 MG tablet Take 1 tablet by mouth 2 (two) times daily. 10 tablet 0   acetaminophen (TYLENOL) 500 MG tablet Take 500-1,000 mg by mouth every 6 (six) hours as needed for mild pain, moderate pain, fever or headache.     acyclovir (ZOVIRAX) 400 MG tablet TAKE 1 TABLET BY MOUTH TWICE DAILY 180 tablet 3   aspirin EC 81 MG tablet Take 81 mg by mouth daily with breakfast.     calcium carbonate (OSCAL) 1500 (600 Ca) MG TABS tablet Take 1,500 mg by mouth daily with breakfast.     Calcium Carbonate 500 MG CHEW Chew 500 mg by mouth as needed for indigestion.     Cholecalciferol (VITAMIN D3) 2000 units capsule Take 2,000 Units by mouth daily.      famotidine (PEPCID) 20 MG tablet Take 20 mg by mouth daily.     fluorometholone (FML) 0.1 % ophthalmic suspension Place 1 drop into both eyes 3 (three) times daily.     loratadine (CLARITIN) 10 MG tablet Take 10 mg by mouth  daily.     Multiple Vitamins-Minerals (CENTRUM SILVER PO) Take 1 tablet by mouth daily.      omeprazole (PRILOSEC) 20 MG capsule TAKE 1 CAPSULE(20 MG) BY MOUTH DAILY 90 capsule 11   ondansetron (ZOFRAN) 8 MG tablet TAKE 1 TABLET(8 MG) BY MOUTH EVERY 8 HOURS AS NEEDED FOR NAUSEA OR VOMITING 60 tablet 11   pomalidomide (POMALYST) 2 MG capsule Take 1 pill daily for 14 days then rest 7 days for cycle of every 21 days 14 capsule 0   predniSONE (DELTASONE) 20 MG tablet Take 1 tablet (20 mg total) by mouth daily with breakfast. 7 tablet 0   senna-docusate (SENOKOT-S) 8.6-50 MG tablet Take 1-2 tablets by mouth daily as needed for mild constipation or moderate constipation.      sodium chloride (OCEAN) 0.65  % SOLN nasal spray Place 1 spray into both nostrils as needed for congestion.     triamcinolone ointment (KENALOG) 0.1 % Apply 1 application topically daily.     triamterene-hydrochlorothiazide (MAXZIDE-25) 37.5-25 MG tablet Take 0.5 tablets by mouth daily.     vitamin B-12 (CYANOCOBALAMIN) 500 MCG tablet Take 500 mcg by mouth daily.     No current facility-administered medications for this visit.   Facility-Administered Medications Ordered in Other Visits  Medication Dose Route Frequency Provider Last Rate Last Admin   0.9 %  sodium chloride infusion   Intravenous Once Alvy Bimler, Crixus Mcaulay, MD        PHYSICAL EXAMINATION: ECOG PERFORMANCE STATUS: 1 - Symptomatic but completely ambulatory  Vitals:   03/13/21 1048  BP: (!) 127/96  Pulse: 66  Resp: 18  Temp: (!) 97.5 F (36.4 C)  SpO2: 100%   Filed Weights   03/13/21 1048  Weight: 111 lb 12.8 oz (50.7 kg)    GENERAL:alert, no distress and comfortable SKIN: She has fine maculopapular rash throughout consistent with drug rash and evidence of significant cellulitis on her arm at the site of tetanus injection EYES: normal, Conjunctiva are pink and non-injected, sclera clear OROPHARYNX:no exudate, no erythema and lips, buccal mucosa, and tongue normal  NECK: supple, thyroid normal size, non-tender, without nodularity LYMPH:  no palpable lymphadenopathy in the cervical, axillary or inguinal LUNGS: clear to auscultation and percussion with normal breathing effort HEART: regular rate & rhythm and no murmurs and no lower extremity edema ABDOMEN:abdomen soft, non-tender and normal bowel sounds Musculoskeletal:no cyanosis of digits and no clubbing  NEURO: alert & oriented x 3 with fluent speech, no focal motor/sensory deficits  LABORATORY DATA:  I have reviewed the data as listed    Component Value Date/Time   NA 125 (L) 03/09/2021 1037   NA 143 07/14/2017 1245   K 4.1 03/09/2021 1037   K 3.3 (L) 07/14/2017 1245   CL 93 (L) 03/09/2021 1037    CO2 23 03/09/2021 1037   CO2 20 (L) 07/14/2017 1245   GLUCOSE 86 03/09/2021 1037   GLUCOSE 106 07/14/2017 1245   BUN 15 03/09/2021 1037   BUN 9.3 07/14/2017 1245   CREATININE 0.89 03/09/2021 1037   CREATININE 1.01 (H) 01/27/2021 0847   CREATININE 0.8 07/14/2017 1245   CALCIUM 9.1 03/09/2021 1037   CALCIUM 8.4 07/14/2017 1245   PROT 7.1 03/09/2021 1037   PROT 6.0 07/14/2017 1245   PROT 6.2 (L) 07/14/2017 1245   ALBUMIN 4.2 03/09/2021 1037   ALBUMIN 3.6 07/14/2017 1245   AST 22 03/09/2021 1037   AST 21 01/27/2021 0847   AST 21 07/14/2017 1245   ALT 15 03/09/2021  1037   ALT 14 01/27/2021 0847   ALT 21 07/14/2017 1245   ALKPHOS 51 03/09/2021 1037   ALKPHOS 78 07/14/2017 1245   BILITOT 0.8 03/09/2021 1037   BILITOT 0.6 01/27/2021 0847   BILITOT 0.43 07/14/2017 1245   GFRNONAA >60 03/09/2021 1037   GFRNONAA 60 (L) 01/27/2021 0847   GFRAA 57 (L) 04/21/2020 0817   GFRAA >60 01/14/2020 0828    No results found for: SPEP, UPEP  Lab Results  Component Value Date   WBC 7.6 03/09/2021   NEUTROABS 5.5 03/09/2021   HGB 11.6 (L) 03/09/2021   HCT 34.9 (L) 03/09/2021   MCV 82.7 03/09/2021   PLT 210 03/09/2021      Chemistry      Component Value Date/Time   NA 125 (L) 03/09/2021 1037   NA 143 07/14/2017 1245   K 4.1 03/09/2021 1037   K 3.3 (L) 07/14/2017 1245   CL 93 (L) 03/09/2021 1037   CO2 23 03/09/2021 1037   CO2 20 (L) 07/14/2017 1245   BUN 15 03/09/2021 1037   BUN 9.3 07/14/2017 1245   CREATININE 0.89 03/09/2021 1037   CREATININE 1.01 (H) 01/27/2021 0847   CREATININE 0.8 07/14/2017 1245      Component Value Date/Time   CALCIUM 9.1 03/09/2021 1037   CALCIUM 8.4 07/14/2017 1245   ALKPHOS 51 03/09/2021 1037   ALKPHOS 78 07/14/2017 1245   AST 22 03/09/2021 1037   AST 21 01/27/2021 0847   AST 21 07/14/2017 1245   ALT 15 03/09/2021 1037   ALT 14 01/27/2021 0847   ALT 21 07/14/2017 1245   BILITOT 0.8 03/09/2021 1037   BILITOT 0.6 01/27/2021 0847   BILITOT 0.43  07/14/2017 1245       RADIOGRAPHIC STUDIES: I have personally reviewed the radiological images as listed and agreed with the findings in the report. DG Ribs Unilateral W/Chest Right  Result Date: 03/09/2021 CLINICAL DATA:  Fall, pain EXAM: RIGHT RIBS AND CHEST - 3+ VIEW COMPARISON:  Chest radiographs, 03/30/2019, CT chest, 07/13/2016 FINDINGS: No acute displaced fracture or other bone lesions are seen involving the ribs. There is a chronic, sclerotic fracture deformity of the posterior left ninth rib, unchanged compared to priors. There is no evidence of pneumothorax or pleural effusion. Both lungs are clear. Heart size and mediastinal contours are within normal limits. IMPRESSION: 1. No acute displaced fracture of the right ribs.  No pneumothorax. 2. Chronic, sclerotic fracture deformity of the posterior left ninth rib, unchanged compared to priors. Electronically Signed   By: Eddie Candle M.D.   On: 03/09/2021 10:58   CT Head Wo Contrast  Result Date: 03/09/2021 CLINICAL DATA:  Fall EXAM: CT HEAD WITHOUT CONTRAST TECHNIQUE: Contiguous axial images were obtained from the base of the skull through the vertex without intravenous contrast. COMPARISON:  06/06/2014 FINDINGS: Brain: There is atrophy and chronic small vessel disease changes. No acute intracranial abnormality. Specifically, no hemorrhage, hydrocephalus, mass lesion, acute infarction, or significant intracranial injury. Vascular: No hyperdense vessel or unexpected calcification. Skull: No acute calvarial abnormality. Prior right frontal craniotomy Sinuses/Orbits: No acute abnormality. Other: None IMPRESSION: Atrophy, chronic microvascular disease. No acute intracranial abnormality. Electronically Signed   By: Rolm Baptise M.D.   On: 03/09/2021 10:43   CT Cervical Spine Wo Contrast  Result Date: 03/09/2021 CLINICAL DATA:  Fall EXAM: CT CERVICAL SPINE WITHOUT CONTRAST TECHNIQUE: Multidetector CT imaging of the cervical spine was performed  without intravenous contrast. Multiplanar CT image reconstructions were also generated. COMPARISON:  None. FINDINGS: Alignment: Normal Skull base and vertebrae: No acute fracture. No primary bone lesion or focal pathologic process. Soft tissues and spinal canal: No prevertebral fluid or swelling. No visible canal hematoma. Disc levels: Degenerative disc disease at C5-6 and C6-7 with disc space narrowing and spurring. Upper chest: No acute findings Other: None IMPRESSION: No acute bony abnormality. Electronically Signed   By: Rolm Baptise M.D.   On: 03/09/2021 10:44   DG Bone Survey Met  Result Date: 03/05/2021 CLINICAL DATA:  Myeloma staging. EXAM: METASTATIC BONE SURVEY COMPARISON:  PET-CT 04/20/2019. CT 07/13/2016. Chest x-ray 07/12/2016. Head CT 06/06/2014. FINDINGS: Images of the axial and appendicular skeleton performed. Diffuse osteopenia. Stable deformity left ninth rib. Diffuse mild degenerative change cervicothoracic and thoracolumbar spine. Prior craniotomy. Two faint lucencies noted over the posterior skull. 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 No focal abnormality noted to suggest myeloma or metastatic disease. IMPRESSION: 1. No focal abnormality noted to suggest myeloma or metastatic disease. 2.  Diffuse osteopenia. Electronically Signed   By: Marcello Moores  Register   On: 03/05/2021 09:28

## 2021-03-15 NOTE — Assessment & Plan Note (Signed)
Her rash is not consistent with reaction to Pomalyst I suspect she is allergic to recent tetanus injection I recommend she hold Pomalyst until further evaluation next week

## 2021-03-15 NOTE — Assessment & Plan Note (Signed)
I have reviewed documentation from gastroenterologist Her nausea is not consistent with side effects of chemotherapy I recommend we proceed with upper endoscopy evaluation to rule out stomach ulcer or gastritis

## 2021-03-16 ENCOUNTER — Telehealth: Payer: Self-pay

## 2021-03-16 NOTE — Telephone Encounter (Signed)
Called [facility] and spoke with office staff. Dr. Alvy Bimler requesting EGD. Faxed recent office note to [phone]. Received confirmation. Dr. Watt Climes will review note and then schedule.

## 2021-03-19 ENCOUNTER — Encounter: Payer: Self-pay | Admitting: Hematology and Oncology

## 2021-03-19 ENCOUNTER — Other Ambulatory Visit: Payer: Medicare Other

## 2021-03-19 ENCOUNTER — Inpatient Hospital Stay (HOSPITAL_BASED_OUTPATIENT_CLINIC_OR_DEPARTMENT_OTHER): Payer: Medicare Other | Admitting: Hematology and Oncology

## 2021-03-19 ENCOUNTER — Other Ambulatory Visit: Payer: Self-pay

## 2021-03-19 DIAGNOSIS — L270 Generalized skin eruption due to drugs and medicaments taken internally: Secondary | ICD-10-CM

## 2021-03-19 DIAGNOSIS — C9002 Multiple myeloma in relapse: Secondary | ICD-10-CM | POA: Diagnosis not present

## 2021-03-19 DIAGNOSIS — R531 Weakness: Secondary | ICD-10-CM | POA: Diagnosis not present

## 2021-03-19 DIAGNOSIS — R112 Nausea with vomiting, unspecified: Secondary | ICD-10-CM | POA: Diagnosis not present

## 2021-03-19 MED ORDER — POMALIDOMIDE 2 MG PO CAPS: ORAL_CAPSULE | ORAL | 0 refills | Status: DC

## 2021-03-19 NOTE — Progress Notes (Signed)
[place] OFFICE PROGRESS NOTE  Patient Care Team: Josetta Huddle, MD as PCP - General (Internal Medicine) Ginette Pitman, MD as Consulting Physician (Hematology and Oncology) Hessie Dibble, MD as Consulting Physician (Hematology and Oncology)  ASSESSMENT & PLAN:  Multiple myeloma in relapse [facility]) Rash has resolved I recommend resumption of Pomalyst I do not believe her rash is triggered by Pomalyst I will see her at the end of the month for further follow-up  Drug-induced skin rash Skin rash has totally resolved She has completed a course of prednisone  Nausea with vomiting She continues to have intermittent nausea I suggest she keep her appointment with gastroenterologist and undergo EGD for evaluation  Weakness She has no recurrent falls since her last visit She continues to question whether she needs to undergo physical therapy and rehab and I encouraged her to do so  No orders of the defined types were placed in this encounter.   All questions were answered. The patient knows to call the clinic with any problems, questions or concerns. The total time spent in the appointment was 20 minutes encounter with patients including review of chart and various tests results, discussions about plan of care and coordination of care plan   [name], MD 03/19/2021 6:12 PM  INTERVAL HISTORY: Please see below for problem oriented charting. She returns with her husband for further follow-up Her course of antibiotics has caused resolution of cellulitis and the prednisone help with her rash She has not made appointment to see physical therapy and rehab She is questioning whether she should proceed with GI follow-up, cardiology follow-up and PT referral  SUMMARY OF ONCOLOGIC HISTORY: Oncology History Overview Note   M-protein 0.69 gm/dl IFIX - IgG, Kappa IgG - 868 IgA - 19 IgM - < 20 Kappa - 21 Lambda - 5.7  09/06/2014 - Bone marrow aspirate and biopsy:    Normocellular marrow for age (40%) with a small monoclonal plasma cell population (1% on aspirate). Karyotype 65, XX  FISH Negative for myeloma associated changes  09/12/2014 - PET/CT  Two regions that are concerning for disease, one adjacent/involving the left ninth rib and one in the marrow of the right femur, in this patient with history of plasmacytoma.    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   04/01/2015 Bone Marrow Transplant   She received melphalan chemotherapy on 03/31/2015 followed by autologous stem cell transplant the day after   04/03/2015 - 04/18/2015  Hospital Admission   The patient was admitted to the hospital at [facility] for management related to complication from stem cell transplant. She had significant nausea requiring intravenous anti-emetics.   07/17/2015 -  Chemotherapy   She started maintenance Revlimid and monthly zometa, then every 3 months   06/01/2018 Imaging   DEXA scan showed bone density T score in femur -2.3    04/20/2019 PET scan   1. No FDG avid osseous lesions or mass identified to suggest metabolically active lesion of myeloma or plasmacytoma. 2. Small nodular density within the paravertebral right lower lobe exhibits mild to moderate increased uptake within SUV max of 3.38. This is indeterminate. Review of CT chest from 10/05/2018 shows a corresponding Lung nodule in this area measuring the same. Small pulmonary neoplasm cannot be excluded. 3. Indeterminate, focal area of increased uptake is identified within the thoracic canal. Indeterminate favored to represent benign physiologic CNS activity.     04/30/2019 -  Chemotherapy      Patient is on Antibody Plan: MYELOMA SQ DARATUMUMAB FASPRO Q28D     04/30/2019 - 07/09/2019 Chemotherapy   The patient had bortezomib SQ (VELCADE) chemo injection 1.75 mg, 1.3 mg/m2 = 1.75 mg, Subcutaneous,  Once, 10 of 11 cycles Administration: 1.75 mg (04/30/2019), 1.75 mg (05/07/2019), 1.75 mg (05/14/2019), 1.75 mg (05/21/2019), 1.75 mg (05/28/2019), 1.75 mg (06/04/2019), 1.75 mg (06/11/2019), 1.75 mg (06/18/2019), 1.75 mg (06/25/2019), 1.75 mg (07/09/2019)   for chemotherapy treatment.       REVIEW OF SYSTEMS:   Constitutional: Denies fevers, chills or abnormal weight loss Eyes: Denies blurriness of vision Ears, nose, mouth, throat, and face: Denies mucositis or sore throat Respiratory: Denies cough, dyspnea or wheezes Cardiovascular: Denies palpitation, chest discomfort or lower extremity swelling Gastrointestinal:  Denies nausea, heartburn or change in bowel habits Skin: Denies abnormal skin rashes Lymphatics: Denies new lymphadenopathy or easy bruising Neurological:Denies numbness, tingling or new weaknesses Behavioral/Psych: Mood is stable, no new changes  All other systems were reviewed with the patient and are negative.  I have reviewed the past medical history, past surgical history, social history and family history with the patient and they are  unchanged from previous note.  ALLERGIES:  is allergic to augmentin [amoxicillin-pot clavulanate], albuterol, codeine, ibuprofen, nsaids, tolmetin, and tetanus-diphth-acell pertussis.  MEDICATIONS:  Current Outpatient Medications  Medication Sig Dispense Refill   acetaminophen (TYLENOL) 500 MG tablet Take 500-1,000 mg by mouth every 6 (six) hours as needed for mild pain, moderate pain, fever or headache.     acyclovir (ZOVIRAX) 400 MG tablet TAKE 1 TABLET BY MOUTH TWICE DAILY 180 tablet 3   aspirin EC 81 MG tablet Take 81 mg by mouth daily with breakfast.     calcium carbonate (OSCAL) 1500 (600 Ca) MG TABS tablet Take 1,500 mg by mouth daily with breakfast.     Calcium Carbonate 500 MG CHEW Chew 500 mg by mouth as needed for indigestion.     Cholecalciferol (VITAMIN D3) 2000 units capsule Take 2,000 Units by mouth daily.      famotidine (PEPCID) 20 MG tablet Take 20 mg by mouth daily.     fluorometholone (FML) 0.1 % ophthalmic suspension Place 1 drop into both eyes 3 (three) times daily.     loratadine (CLARITIN) 10 MG tablet Take 10 mg by mouth daily.     Multiple Vitamins-Minerals (CENTRUM SILVER PO) Take 1 tablet by mouth daily.      omeprazole (PRILOSEC) 20 MG capsule TAKE 1 CAPSULE(20 MG) BY MOUTH DAILY  90 capsule 11   ondansetron (ZOFRAN) 8 MG tablet TAKE 1 TABLET(8 MG) BY MOUTH EVERY 8 HOURS AS NEEDED FOR NAUSEA OR VOMITING 60 tablet 11   pomalidomide (POMALYST) 2 MG capsule Take 1 pill daily for 14 days then rest 7 days for cycle of every 21 days 14 capsule 0   predniSONE (DELTASONE) 20 MG tablet Take 1 tablet (20 mg total) by mouth daily with breakfast. 7 tablet 0   senna-docusate (SENOKOT-S) 8.6-50 MG tablet Take 1-2 tablets by mouth daily as needed for mild constipation or moderate constipation.      sodium chloride (OCEAN) 0.65 % SOLN nasal spray Place 1 spray into both nostrils as needed for congestion.     sulfamethoxazole-trimethoprim (BACTRIM DS) 800-160 MG tablet Take 1 tablet  by mouth 2 (two) times daily. 10 tablet 0   triamcinolone ointment (KENALOG) 0.1 % Apply 1 application topically daily.     triamterene-hydrochlorothiazide (MAXZIDE-25) 37.5-25 MG tablet Take 0.5 tablets by mouth daily.     vitamin B-12 (CYANOCOBALAMIN) 500 MCG tablet Take 500 mcg by mouth daily.     No current facility-administered medications for this visit.   Facility-Administered Medications Ordered in Other Visits  Medication Dose Route Frequency Provider Last Rate Last Admin   0.9 %  sodium chloride infusion   Intravenous Once Alvy Bimler, Shea Kapur, MD        PHYSICAL EXAMINATION: ECOG PERFORMANCE STATUS: 1 - Symptomatic but completely ambulatory  Vitals:   03/19/21 1002  BP: (!) 125/91  Pulse: 73  Resp: 18  Temp: 97.7 F (36.5 C)  SpO2: 100%   Filed Weights   03/19/21 1002  Weight: 107 lb 8 oz (48.8 kg)    GENERAL:alert, no distress and comfortable SKIN: skin color, texture, turgor are normal, no rashes or significant lesions EYES: normal, Conjunctiva are pink and non-injected, sclera clear OROPHARYNX:no exudate, no erythema and lips, buccal mucosa, and tongue normal  NECK: supple, thyroid normal size, non-tender, without nodularity LYMPH:  no palpable lymphadenopathy in the cervical, axillary or inguinal LUNGS: clear to auscultation and percussion with normal breathing effort HEART: regular rate & rhythm and no murmurs and no lower extremity edema ABDOMEN:abdomen soft, non-tender and normal bowel sounds Musculoskeletal:no cyanosis of digits and no clubbing  NEURO: alert & oriented x 3 with fluent speech, no focal motor/sensory deficits  LABORATORY DATA:  I have reviewed the data as listed    Component Value Date/Time   NA 125 (L) 03/09/2021 1037   NA 143 07/14/2017 1245   K 4.1 03/09/2021 1037   K 3.3 (L) 07/14/2017 1245   CL 93 (L) 03/09/2021 1037   CO2 23 03/09/2021 1037   CO2 20 (L) 07/14/2017 1245   GLUCOSE 86 03/09/2021 1037   GLUCOSE 106 07/14/2017 1245   BUN  15 03/09/2021 1037   BUN 9.3 07/14/2017 1245   CREATININE 0.89 03/09/2021 1037   CREATININE 1.01 (H) 01/27/2021 0847   CREATININE 0.8 07/14/2017 1245   CALCIUM 9.1 03/09/2021 1037   CALCIUM 8.4 07/14/2017 1245   PROT 7.1 03/09/2021 1037   PROT 6.0 07/14/2017 1245   PROT 6.2 (L) 07/14/2017 1245   ALBUMIN 4.2 03/09/2021 1037   ALBUMIN 3.6 07/14/2017 1245   AST 22 03/09/2021 1037   AST 21 01/27/2021 0847   AST 21 07/14/2017 1245   ALT 15 03/09/2021 1037   ALT 14 01/27/2021 0847   ALT 21 07/14/2017 1245   ALKPHOS 51 03/09/2021 1037   ALKPHOS 78 07/14/2017 1245  BILITOT 0.8 03/09/2021 1037   BILITOT 0.6 01/27/2021 0847   BILITOT 0.43 07/14/2017 1245   GFRNONAA >60 03/09/2021 1037   GFRNONAA 60 (L) 01/27/2021 0847   GFRAA 57 (L) 04/21/2020 0817   GFRAA >60 01/14/2020 0828    No results found for: SPEP, UPEP  Lab Results  Component Value Date   WBC 7.6 03/09/2021   NEUTROABS 5.5 03/09/2021   HGB 11.6 (L) 03/09/2021   HCT 34.9 (L) 03/09/2021   MCV 82.7 03/09/2021   PLT 210 03/09/2021      Chemistry      Component Value Date/Time   NA 125 (L) 03/09/2021 1037   NA 143 07/14/2017 1245   K 4.1 03/09/2021 1037   K 3.3 (L) 07/14/2017 1245   CL 93 (L) 03/09/2021 1037   CO2 23 03/09/2021 1037   CO2 20 (L) 07/14/2017 1245   BUN 15 03/09/2021 1037   BUN 9.3 07/14/2017 1245   CREATININE 0.89 03/09/2021 1037   CREATININE 1.01 (H) 01/27/2021 0847   CREATININE 0.8 07/14/2017 1245      Component Value Date/Time   CALCIUM 9.1 03/09/2021 1037   CALCIUM 8.4 07/14/2017 1245   ALKPHOS 51 03/09/2021 1037   ALKPHOS 78 07/14/2017 1245   AST 22 03/09/2021 1037   AST 21 01/27/2021 0847   AST 21 07/14/2017 1245   ALT 15 03/09/2021 1037   ALT 14 01/27/2021 0847   ALT 21 07/14/2017 1245   BILITOT 0.8 03/09/2021 1037   BILITOT 0.6 01/27/2021 0847   BILITOT 0.43 07/14/2017 1245       RADIOGRAPHIC STUDIES: I have personally reviewed the radiological images as listed and agreed  with the findings in the report. DG Ribs Unilateral W/Chest Right  Result Date: 03/09/2021 CLINICAL DATA:  Fall, pain EXAM: RIGHT RIBS AND CHEST - 3+ VIEW COMPARISON:  Chest radiographs, 03/30/2019, CT chest, 07/13/2016 FINDINGS: No acute displaced fracture or other bone lesions are seen involving the ribs. There is a chronic, sclerotic fracture deformity of the posterior left ninth rib, unchanged compared to priors. There is no evidence of pneumothorax or pleural effusion. Both lungs are clear. Heart size and mediastinal contours are within normal limits. IMPRESSION: 1. No acute displaced fracture of the right ribs.  No pneumothorax. 2. Chronic, sclerotic fracture deformity of the posterior left ninth rib, unchanged compared to priors. Electronically Signed   By: Eddie Candle M.D.   On: 03/09/2021 10:58   CT Head Wo Contrast  Result Date: 03/09/2021 CLINICAL DATA:  Fall EXAM: CT HEAD WITHOUT CONTRAST TECHNIQUE: Contiguous axial images were obtained from the base of the skull through the vertex without intravenous contrast. COMPARISON:  06/06/2014 FINDINGS: Brain: There is atrophy and chronic small vessel disease changes. No acute intracranial abnormality. Specifically, no hemorrhage, hydrocephalus, mass lesion, acute infarction, or significant intracranial injury. Vascular: No hyperdense vessel or unexpected calcification. Skull: No acute calvarial abnormality. Prior right frontal craniotomy Sinuses/Orbits: No acute abnormality. Other: None IMPRESSION: Atrophy, chronic microvascular disease. No acute intracranial abnormality. Electronically Signed   By: Rolm Baptise M.D.   On: 03/09/2021 10:43   CT Cervical Spine Wo Contrast  Result Date: 03/09/2021 CLINICAL DATA:  Fall EXAM: CT CERVICAL SPINE WITHOUT CONTRAST TECHNIQUE: Multidetector CT imaging of the cervical spine was performed without intravenous contrast. Multiplanar CT image reconstructions were also generated. COMPARISON:  None. FINDINGS: Alignment:  Normal Skull base and vertebrae: No acute fracture. No primary bone lesion or focal pathologic process. Soft tissues and spinal canal: No prevertebral  fluid or swelling. No visible canal hematoma. Disc levels: Degenerative disc disease at C5-6 and C6-7 with disc space narrowing and spurring. Upper chest: No acute findings Other: None IMPRESSION: No acute bony abnormality. Electronically Signed   By: Rolm Baptise M.D.   On: 03/09/2021 10:44   DG Bone Survey Met  Result Date: 03/05/2021 CLINICAL DATA:  Myeloma staging. EXAM: METASTATIC BONE SURVEY COMPARISON:  PET-CT 04/20/2019. CT 07/13/2016. Chest x-ray 07/12/2016. Head CT 06/06/2014. FINDINGS: Images of the axial and appendicular skeleton performed. Diffuse osteopenia. Stable deformity left ninth rib. Diffuse mild degenerative change cervicothoracic and thoracolumbar spine. Prior craniotomy. Two faint lucencies noted over the posterior skull. 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 No focal abnormality noted to suggest myeloma or metastatic disease. IMPRESSION: 1. No focal abnormality noted to suggest myeloma or metastatic disease. 2.  Diffuse osteopenia. Electronically Signed   By: Marcello Moores  Register   On: 03/05/2021 09:28

## 2021-03-19 NOTE — Assessment & Plan Note (Signed)
Skin rash has totally resolved She has completed a course of prednisone

## 2021-03-19 NOTE — Assessment & Plan Note (Signed)
She continues to have intermittent nausea I suggest she keep her appointment with gastroenterologist and undergo EGD for evaluation

## 2021-03-19 NOTE — Assessment & Plan Note (Signed)
She has no recurrent falls since her last visit She continues to question whether she needs to undergo physical therapy and rehab and I encouraged her to do so

## 2021-03-19 NOTE — Assessment & Plan Note (Signed)
Rash has resolved I recommend resumption of Pomalyst I do not believe her rash is triggered by Pomalyst I will see her at the end of the month for further follow-up

## 2021-03-20 ENCOUNTER — Encounter: Payer: Self-pay | Admitting: *Deleted

## 2021-03-23 ENCOUNTER — Telehealth: Payer: Self-pay

## 2021-03-23 NOTE — Telephone Encounter (Signed)
Called and given below message. She is doing well and denies rash. Ask her to call the office back if needed. Given message to Dr. Alvy Bimler.

## 2021-03-23 NOTE — Telephone Encounter (Signed)
-----   Message from [name], MD sent at 03/23/2021  9:21 AM EDT ----- No urgent, can you call and ask if she does ok with restarting pomalyst? ANy rash?

## 2021-03-24 ENCOUNTER — Other Ambulatory Visit: Payer: Medicare Other

## 2021-03-24 ENCOUNTER — Ambulatory Visit: Payer: Medicare Other | Admitting: Hematology and Oncology

## 2021-03-24 ENCOUNTER — Ambulatory Visit: Payer: Medicare Other

## 2021-03-26 ENCOUNTER — Telehealth: Payer: Self-pay

## 2021-03-26 NOTE — Telephone Encounter (Signed)
Called [facility] to check on requested EGD. They will check with MD and call Becky back if she really needs to see Dr. Watt Climes prior to EGD.  [place] and given above message. She verbalized understanding. She will call the office back with GI response.

## 2021-03-27 ENCOUNTER — Telehealth: Payer: Self-pay

## 2021-03-27 NOTE — Telephone Encounter (Signed)
She called. EGD is scheduled for 8/22 at [facility].

## 2021-03-31 ENCOUNTER — Other Ambulatory Visit: Payer: Self-pay | Admitting: Hematology and Oncology

## 2021-03-31 ENCOUNTER — Inpatient Hospital Stay (HOSPITAL_BASED_OUTPATIENT_CLINIC_OR_DEPARTMENT_OTHER): Payer: Medicare Other | Admitting: Hematology and Oncology

## 2021-03-31 ENCOUNTER — Inpatient Hospital Stay: Payer: Medicare Other

## 2021-03-31 ENCOUNTER — Encounter: Payer: Self-pay | Admitting: Hematology and Oncology

## 2021-03-31 ENCOUNTER — Other Ambulatory Visit: Payer: Self-pay

## 2021-03-31 DIAGNOSIS — C9002 Multiple myeloma in relapse: Secondary | ICD-10-CM

## 2021-03-31 DIAGNOSIS — R42 Dizziness and giddiness: Secondary | ICD-10-CM

## 2021-03-31 DIAGNOSIS — Z7189 Other specified counseling: Secondary | ICD-10-CM

## 2021-03-31 DIAGNOSIS — G620 Drug-induced polyneuropathy: Secondary | ICD-10-CM | POA: Diagnosis not present

## 2021-03-31 DIAGNOSIS — T451X5A Adverse effect of antineoplastic and immunosuppressive drugs, initial encounter: Secondary | ICD-10-CM

## 2021-03-31 DIAGNOSIS — Z9481 Bone marrow transplant status: Secondary | ICD-10-CM

## 2021-03-31 DIAGNOSIS — C9001 Multiple myeloma in remission: Secondary | ICD-10-CM

## 2021-03-31 LAB — CBC WITH DIFFERENTIAL/PLATELET
Abs Immature Granulocytes: 0 10*3/uL (ref 0.00–0.07)
Basophils Absolute: 0.2 10*3/uL — ABNORMAL HIGH (ref 0.0–0.1)
Basophils Relative: 5 %
Eosinophils Absolute: 0.1 10*3/uL (ref 0.0–0.5)
Eosinophils Relative: 3 %
HCT: 31.9 % — ABNORMAL LOW (ref 36.0–46.0)
Hemoglobin: 10.6 g/dL — ABNORMAL LOW (ref 12.0–15.0)
Immature Granulocytes: 0 %
Lymphocytes Relative: 47 %
Lymphs Abs: 2 10*3/uL (ref 0.7–4.0)
MCH: 27.2 pg (ref 26.0–34.0)
MCHC: 33.2 g/dL (ref 30.0–36.0)
MCV: 81.8 fL (ref 80.0–100.0)
Monocytes Absolute: 0.3 10*3/uL (ref 0.1–1.0)
Monocytes Relative: 8 %
Neutro Abs: 1.5 10*3/uL — ABNORMAL LOW (ref 1.7–7.7)
Neutrophils Relative %: 37 %
Platelets: 271 10*3/uL (ref 150–400)
RBC: 3.9 MIL/uL (ref 3.87–5.11)
RDW: 14.3 % (ref 11.5–15.5)
WBC: 4.1 10*3/uL (ref 4.0–10.5)
nRBC: 0 % (ref 0.0–0.2)

## 2021-03-31 LAB — COMPREHENSIVE METABOLIC PANEL
ALT: 12 U/L (ref 0–44)
AST: 17 U/L (ref 15–41)
Albumin: 3.8 g/dL (ref 3.5–5.0)
Alkaline Phosphatase: 64 U/L (ref 38–126)
Anion gap: 9 (ref 5–15)
BUN: 13 mg/dL (ref 8–23)
CO2: 21 mmol/L — ABNORMAL LOW (ref 22–32)
Calcium: 8.9 mg/dL (ref 8.9–10.3)
Chloride: 105 mmol/L (ref 98–111)
Creatinine, Ser: 0.9 mg/dL (ref 0.44–1.00)
GFR, Estimated: >60 mL/min (ref >60)
Glucose, Bld: 78 mg/dL (ref 70–99)
Potassium: 3.9 mmol/L (ref 3.5–5.1)
Sodium: 135 mmol/L (ref 135–145)
Total Bilirubin: 0.5 mg/dL (ref 0.3–1.2)
Total Protein: 6.8 g/dL (ref 6.5–8.1)

## 2021-03-31 MED ORDER — DARATUMUMAB-HYALURONIDASE-FIHJ 1800-30000 MG-UT/15ML ~~LOC~~ SOLN
1800.0000 mg | Freq: Once | SUBCUTANEOUS | Status: AC
  Administered 2021-03-31: 1800 mg via SUBCUTANEOUS
  Filled 2021-03-31: qty 15

## 2021-03-31 MED ORDER — SODIUM CHLORIDE 0.9 % IV SOLN: INTRAVENOUS | Status: DC

## 2021-03-31 MED ORDER — CYANOCOBALAMIN 1000 MCG/ML IJ SOLN
1000.0000 ug | Freq: Once | INTRAMUSCULAR | Status: AC
  Administered 2021-03-31: 1000 ug via INTRAMUSCULAR
  Filled 2021-03-31: qty 1

## 2021-03-31 MED ORDER — MONTELUKAST SODIUM 10 MG PO TABS
10.0000 mg | ORAL_TABLET | Freq: Once | ORAL | Status: AC
  Administered 2021-03-31: 10 mg via ORAL
  Filled 2021-03-31: qty 1

## 2021-03-31 MED ORDER — DEXAMETHASONE 4 MG PO TABS
8.0000 mg | ORAL_TABLET | Freq: Once | ORAL | Status: AC
  Administered 2021-03-31: 8 mg via ORAL
  Filled 2021-03-31: qty 2

## 2021-03-31 MED ORDER — ZOLEDRONIC ACID 4 MG/5ML IV CONC
3.0000 mg | Freq: Once | INTRAVENOUS | Status: AC
  Administered 2021-03-31: 3 mg via INTRAVENOUS
  Filled 2021-03-31: qty 3.75

## 2021-03-31 MED ORDER — ACETAMINOPHEN 325 MG PO TABS
650.0000 mg | ORAL_TABLET | Freq: Once | ORAL | Status: AC
  Administered 2021-03-31: 650 mg via ORAL
  Filled 2021-03-31: qty 2

## 2021-03-31 NOTE — Progress Notes (Signed)
[facility] OFFICE PROGRESS NOTE  Patient Care Team: Josetta Huddle, MD as PCP - General (Internal Medicine) Ginette Pitman, MD as Consulting Physician (Hematology and Oncology) Hessie Dibble, MD as Consulting Physician (Hematology and Oncology)  ASSESSMENT & PLAN:  Multiple myeloma in relapse [facility]) Rash has resolved I recommend resumption of Pomalyst I do not believe her rash is triggered by Pomalyst She will continue daratumumab/Faspro monthly She will also get calcium with vitamin D and Zometa every 6 months She will continue acyclovir for antimicrobial prophylaxis and aspirin for DVT prophylaxis I will see her again next month for further follow-up  Postural dizziness She is scheduled to see cardiologist  Neuropathy due to chemotherapeutic drug I will send referral for physical therapy and rehab   No orders of the defined types were placed in this encounter.   All questions were answered. The patient knows to call the clinic with any problems, questions or concerns. The total time spent in the appointment was 20 minutes encounter with patients including review of chart and various tests results, discussions about plan of care and coordination of care plan   [name], MD 03/31/2021 10:29 AM  INTERVAL HISTORY: Please see below for problem oriented charting. She returns for treatment She denies recent rash No recent falls She has not been scheduled to see physical therapist yet She had negative GI evaluation yesterday, apparently  SUMMARY OF ONCOLOGIC HISTORY: Oncology History Overview Note   M-protein 0.69 gm/dl IFIX - IgG, Kappa IgG - 868 IgA - 19 IgM - < 20 Kappa - 21 Lambda - 5.7  09/06/2014 - Bone marrow aspirate and biopsy:   Normocellular marrow for age (40%) with a small monoclonal plasma cell population (1% on aspirate). Karyotype 28, XX  FISH Negative for myeloma associated changes  09/12/2014 - PET/CT  Two regions that are  concerning for disease, one adjacent/involving the left ninth rib and one in the marrow of the right femur, in this patient with history of plasmacytoma.    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   04/01/2015 Bone Marrow Transplant   She received melphalan chemotherapy on 03/31/2015 followed by autologous stem cell transplant the day after   04/03/2015 - 04/18/2015 Hospital Admission   The patient was admitted to the hospital at [place] for management related to complication from stem cell transplant. She had significant nausea requiring intravenous anti-emetics.   07/17/2015 -  Chemotherapy   She started  maintenance Revlimid and monthly zometa, then every 3 months   06/01/2018 Imaging   DEXA scan showed bone density T score in femur -2.3   04/20/2019 PET scan   1. No FDG avid osseous lesions or mass identified to suggest metabolically active lesion of myeloma or plasmacytoma. 2. Small nodular density within the paravertebral right lower lobe  exhibits mild to moderate increased uptake within SUV max of 3.38. This is indeterminate. Review of CT chest from 10/05/2018 shows a corresponding Lung nodule in this area measuring the same. Small pulmonary neoplasm cannot be excluded. 3. Indeterminate, focal area of increased uptake is identified within the thoracic canal. Indeterminate favored to represent benign physiologic CNS activity.     04/30/2019 -  Chemotherapy      Patient is on Antibody Plan: MYELOMA SQ DARATUMUMAB FASPRO Q28D     04/30/2019 - 07/09/2019 Chemotherapy   The patient had bortezomib SQ (VELCADE) chemo injection 1.75 mg, 1.3 mg/m2 = 1.75 mg, Subcutaneous,  Once, 10 of 11 cycles Administration: 1.75 mg (04/30/2019), 1.75 mg (05/07/2019), 1.75 mg (05/14/2019), 1.75 mg (05/21/2019), 1.75 mg (05/28/2019), 1.75 mg (06/04/2019), 1.75 mg (06/11/2019), 1.75 mg (06/18/2019), 1.75 mg (06/25/2019), 1.75 mg (07/09/2019)   for chemotherapy treatment.       REVIEW OF SYSTEMS:   Constitutional: Denies fevers, chills or abnormal weight loss Eyes: Denies blurriness of vision Ears, nose, mouth, throat, and face: Denies mucositis or sore throat Respiratory: Denies cough, dyspnea or wheezes Cardiovascular: Denies palpitation, chest discomfort or lower extremity swelling Gastrointestinal:  Denies nausea, heartburn or change in bowel habits Skin: Denies abnormal skin rashes Lymphatics: Denies new lymphadenopathy or easy bruising Neurological:Denies numbness, tingling or new weaknesses Behavioral/Psych: Mood is stable, no new changes  All other systems were reviewed with the patient and are negative.  I have reviewed the past medical history, past surgical history, social history and family history with the patient and they are unchanged from previous note.  ALLERGIES:  is allergic to augmentin [amoxicillin-pot clavulanate], albuterol, codeine, ibuprofen, nsaids, tolmetin, and tetanus-diphth-acell pertussis.  MEDICATIONS:  Current  Outpatient Medications  Medication Sig Dispense Refill   acetaminophen (TYLENOL) 500 MG tablet Take 500-1,000 mg by mouth every 6 (six) hours as needed for mild pain, moderate pain, fever or headache.     acyclovir (ZOVIRAX) 400 MG tablet TAKE 1 TABLET BY MOUTH TWICE DAILY 180 tablet 3   aspirin EC 81 MG tablet Take 81 mg by mouth daily with breakfast.     calcium carbonate (OSCAL) 1500 (600 Ca) MG TABS tablet Take 1,500 mg by mouth daily with breakfast.     Calcium Carbonate 500 MG CHEW Chew 500 mg by mouth as needed for indigestion.     Cholecalciferol (VITAMIN D3) 2000 units capsule Take 2,000 Units by mouth daily.      famotidine (PEPCID) 20 MG tablet Take 20 mg by mouth daily.     fluorometholone (FML) 0.1 % ophthalmic suspension Place 1 drop into both eyes 3 (three) times daily.     loratadine (CLARITIN) 10 MG tablet Take 10 mg by mouth daily.     Multiple Vitamins-Minerals (CENTRUM SILVER PO) Take 1 tablet by mouth daily.      omeprazole (PRILOSEC) 20 MG capsule TAKE 1 CAPSULE(20 MG) BY MOUTH DAILY 90 capsule 11   ondansetron (ZOFRAN) 8 MG tablet TAKE 1 TABLET(8 MG) BY MOUTH EVERY 8 HOURS AS NEEDED FOR NAUSEA OR VOMITING 60 tablet 11   pomalidomide (POMALYST) 2 MG capsule Take 1 pill daily for  14 days then rest 7 days for cycle of every 21 days 14 capsule 0   predniSONE (DELTASONE) 20 MG tablet Take 1 tablet (20 mg total) by mouth daily with breakfast. 7 tablet 0   senna-docusate (SENOKOT-S) 8.6-50 MG tablet Take 1-2 tablets by mouth daily as needed for mild constipation or moderate constipation.      sodium chloride (OCEAN) 0.65 % SOLN nasal spray Place 1 spray into both nostrils as needed for congestion.     sulfamethoxazole-trimethoprim (BACTRIM DS) 800-160 MG tablet Take 1 tablet by mouth 2 (two) times daily. 10 tablet 0   triamcinolone ointment (KENALOG) 0.1 % Apply 1 application topically daily.     triamterene-hydrochlorothiazide (MAXZIDE-25) 37.5-25 MG tablet Take 0.5 tablets by  mouth daily.     vitamin B-12 (CYANOCOBALAMIN) 500 MCG tablet Take 500 mcg by mouth daily.     No current facility-administered medications for this visit.   Facility-Administered Medications Ordered in Other Visits  Medication Dose Route Frequency Provider Last Rate Last Admin   0.9 %  sodium chloride infusion   Intravenous Once Alvy Bimler, Jabriel Vanduyne, MD       0.9 %  sodium chloride infusion   Intravenous Continuous [name], MD 20 mL/hr at 03/31/21 0935 New Bag at 03/31/21 0935   cyanocobalamin ((VITAMIN B-12)) injection 1,000 mcg  1,000 mcg Intramuscular Once Alvy Bimler, Olga Bourbeau, MD       daratumumab-hyaluronidase-fihj (DARZALEX FASPRO) 1800-30000 MG-UT/15ML chemo SQ injection 1,800 mg  1,800 mg Subcutaneous Once Alvy Bimler, Kazoua Gossen, MD        PHYSICAL EXAMINATION: ECOG PERFORMANCE STATUS: 1 - Symptomatic but completely ambulatory  Vitals:   03/31/21 0831  BP: 124/75  Pulse: 76  Resp: 18  Temp: (!) 97.4 F (36.3 C)  SpO2: 100%   Filed Weights   03/31/21 0831  Weight: 110 lb (49.9 kg)    GENERAL:alert, no distress and comfortable SKIN: skin color, texture, turgor are normal, no rashes or significant lesions EYES: normal, Conjunctiva are pink and non-injected, sclera clear OROPHARYNX:no exudate, no erythema and lips, buccal mucosa, and tongue normal  NECK: supple, thyroid normal size, non-tender, without nodularity LYMPH:  no palpable lymphadenopathy in the cervical, axillary or inguinal LUNGS: clear to auscultation and percussion with normal breathing effort HEART: regular rate & rhythm and no murmurs and no lower extremity edema ABDOMEN:abdomen soft, non-tender and normal bowel sounds Musculoskeletal:no cyanosis of digits and no clubbing  NEURO: alert & oriented x 3 with fluent speech, no focal motor/sensory deficits  LABORATORY DATA:  I have reviewed the data as listed    Component Value Date/Time   NA 135 03/31/2021 0804   NA 143 07/14/2017 1245   K 3.9 03/31/2021 0804   K 3.3 (L)  07/14/2017 1245   CL 105 03/31/2021 0804   CO2 21 (L) 03/31/2021 0804   CO2 20 (L) 07/14/2017 1245   GLUCOSE 78 03/31/2021 0804   GLUCOSE 106 07/14/2017 1245   BUN 13 03/31/2021 0804   BUN 9.3 07/14/2017 1245   CREATININE 0.90 03/31/2021 0804   CREATININE 1.01 (H) 01/27/2021 0847   CREATININE 0.8 07/14/2017 1245   CALCIUM 8.9 03/31/2021 0804   CALCIUM 8.4 07/14/2017 1245   PROT 6.8 03/31/2021 0804   PROT 6.0 07/14/2017 1245   PROT 6.2 (L) 07/14/2017 1245   ALBUMIN 3.8 03/31/2021 0804   ALBUMIN 3.6 07/14/2017 1245   AST 17 03/31/2021 0804   AST 21 01/27/2021 0847   AST 21 07/14/2017 1245   ALT 12 03/31/2021 0804  ALT 14 01/27/2021 0847   ALT 21 07/14/2017 1245   ALKPHOS 64 03/31/2021 0804   ALKPHOS 78 07/14/2017 1245   BILITOT 0.5 03/31/2021 0804   BILITOT 0.6 01/27/2021 0847   BILITOT 0.43 07/14/2017 1245   GFRNONAA >60 03/31/2021 0804   GFRNONAA 60 (L) 01/27/2021 0847   GFRAA 57 (L) 04/21/2020 0817   GFRAA >60 01/14/2020 0828    No results found for: SPEP, UPEP  Lab Results  Component Value Date   WBC 4.1 03/31/2021   NEUTROABS 1.5 (L) 03/31/2021   HGB 10.6 (L) 03/31/2021   HCT 31.9 (L) 03/31/2021   MCV 81.8 03/31/2021   PLT 271 03/31/2021      Chemistry      Component Value Date/Time   NA 135 03/31/2021 0804   NA 143 07/14/2017 1245   K 3.9 03/31/2021 0804   K 3.3 (L) 07/14/2017 1245   CL 105 03/31/2021 0804   CO2 21 (L) 03/31/2021 0804   CO2 20 (L) 07/14/2017 1245   BUN 13 03/31/2021 0804   BUN 9.3 07/14/2017 1245   CREATININE 0.90 03/31/2021 0804   CREATININE 1.01 (H) 01/27/2021 0847   CREATININE 0.8 07/14/2017 1245      Component Value Date/Time   CALCIUM 8.9 03/31/2021 0804   CALCIUM 8.4 07/14/2017 1245   ALKPHOS 64 03/31/2021 0804   ALKPHOS 78 07/14/2017 1245   AST 17 03/31/2021 0804   AST 21 01/27/2021 0847   AST 21 07/14/2017 1245   ALT 12 03/31/2021 0804   ALT 14 01/27/2021 0847   ALT 21 07/14/2017 1245   BILITOT 0.5 03/31/2021 0804    BILITOT 0.6 01/27/2021 0847   BILITOT 0.43 07/14/2017 1245       RADIOGRAPHIC STUDIES: I have personally reviewed the radiological images as listed and agreed with the findings in the report. DG Ribs Unilateral W/Chest Right  Result Date: 03/09/2021 CLINICAL DATA:  Fall, pain EXAM: RIGHT RIBS AND CHEST - 3+ VIEW COMPARISON:  Chest radiographs, 03/30/2019, CT chest, 07/13/2016 FINDINGS: No acute displaced fracture or other bone lesions are seen involving the ribs. There is a chronic, sclerotic fracture deformity of the posterior left ninth rib, unchanged compared to priors. There is no evidence of pneumothorax or pleural effusion. Both lungs are clear. Heart size and mediastinal contours are within normal limits. IMPRESSION: 1. No acute displaced fracture of the right ribs.  No pneumothorax. 2. Chronic, sclerotic fracture deformity of the posterior left ninth rib, unchanged compared to priors. Electronically Signed   By: Eddie Candle M.D.   On: 03/09/2021 10:58   CT Head Wo Contrast  Result Date: 03/09/2021 CLINICAL DATA:  Fall EXAM: CT HEAD WITHOUT CONTRAST TECHNIQUE: Contiguous axial images were obtained from the base of the skull through the vertex without intravenous contrast. COMPARISON:  06/06/2014 FINDINGS: Brain: There is atrophy and chronic small vessel disease changes. No acute intracranial abnormality. Specifically, no hemorrhage, hydrocephalus, mass lesion, acute infarction, or significant intracranial injury. Vascular: No hyperdense vessel or unexpected calcification. Skull: No acute calvarial abnormality. Prior right frontal craniotomy Sinuses/Orbits: No acute abnormality. Other: None IMPRESSION: Atrophy, chronic microvascular disease. No acute intracranial abnormality. Electronically Signed   By: Rolm Baptise M.D.   On: 03/09/2021 10:43   CT Cervical Spine Wo Contrast  Result Date: 03/09/2021 CLINICAL DATA:  Fall EXAM: CT CERVICAL SPINE WITHOUT CONTRAST TECHNIQUE: Multidetector CT  imaging of the cervical spine was performed without intravenous contrast. Multiplanar CT image reconstructions were also generated. COMPARISON:  None. FINDINGS:  Alignment: Normal Skull base and vertebrae: No acute fracture. No primary bone lesion or focal pathologic process. Soft tissues and spinal canal: No prevertebral fluid or swelling. No visible canal hematoma. Disc levels: Degenerative disc disease at C5-6 and C6-7 with disc space narrowing and spurring. Upper chest: No acute findings Other: None IMPRESSION: No acute bony abnormality. Electronically Signed   By: Rolm Baptise M.D.   On: 03/09/2021 10:44   DG Bone Survey Met  Result Date: 03/05/2021 CLINICAL DATA:  Myeloma staging. EXAM: METASTATIC BONE SURVEY COMPARISON:  PET-CT 04/20/2019. CT 07/13/2016. Chest x-ray 07/12/2016. Head CT 06/06/2014. FINDINGS: Images of the axial and appendicular skeleton performed. Diffuse osteopenia. Stable deformity left ninth rib. Diffuse mild degenerative change cervicothoracic and thoracolumbar spine. Prior craniotomy. Two faint lucencies noted over the posterior skull. 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 No focal abnormality noted to suggest myeloma or metastatic disease. IMPRESSION: 1. No focal abnormality noted to suggest myeloma or metastatic disease. 2.  Diffuse osteopenia. Electronically Signed   By: Marcello Moores  Register   On: 03/05/2021 09:28

## 2021-03-31 NOTE — Assessment & Plan Note (Signed)
She is scheduled to see cardiologist

## 2021-03-31 NOTE — Assessment & Plan Note (Signed)
I will send referral for physical therapy and rehab

## 2021-03-31 NOTE — Patient Instructions (Signed)
[facility]  Discharge Instructions: Thank you for choosing [facility] to provide your oncology and hematology care.   If you have a lab appointment with the [facility], please go directly to the [facility] and check in at the registration area.   Wear comfortable clothing and clothing appropriate for easy access to any Portacath or PICC line.   We strive to give you quality time with your provider. You may need to reschedule your appointment if you arrive late (15 or more minutes).  Arriving late affects you and other patients whose appointments are after yours.  Also, if you miss three or more appointments without notifying the office, you may be dismissed from the clinic at the provider's discretion.      For prescription refill requests, have your pharmacy contact our office and allow 72 hours for refills to be completed.    Today you received the following chemotherapy and/or immunotherapy agents : Darzalex Faspro, Zometa    To help prevent nausea and vomiting after your treatment, we encourage you to take your nausea medication as directed.  BELOW ARE SYMPTOMS THAT SHOULD BE REPORTED IMMEDIATELY: *FEVER GREATER THAN 100.4 F (38 C) OR HIGHER *CHILLS OR SWEATING *NAUSEA AND VOMITING THAT IS NOT CONTROLLED WITH YOUR NAUSEA MEDICATION *UNUSUAL SHORTNESS OF BREATH *UNUSUAL BRUISING OR BLEEDING *URINARY PROBLEMS (pain or burning when urinating, or frequent urination) *BOWEL PROBLEMS (unusual diarrhea, constipation, pain near the anus) TENDERNESS IN MOUTH AND THROAT WITH OR WITHOUT PRESENCE OF ULCERS (sore throat, sores in mouth, or a toothache) UNUSUAL RASH, SWELLING OR PAIN  UNUSUAL VAGINAL DISCHARGE OR ITCHING   Items with * indicate a potential emergency and should be followed up as soon as possible or go to the Emergency Department if any problems should occur.  Please show the CHEMOTHERAPY ALERT CARD or IMMUNOTHERAPY ALERT CARD at  check-in to the Emergency Department and triage nurse.  Should you have questions after your visit or need to cancel or reschedule your appointment, please contact [facility]  Dept: [phone]  and follow the prompts.  Office hours are 8:00 a.m. to 4:30 p.m. Monday - Friday. Please note that voicemails left after 4:00 p.m. may not be returned until the following business day.  We are closed weekends and major holidays. You have access to a nurse at all times for urgent questions. Please call the main number to the clinic Dept: [phone] and follow the prompts.   For any non-urgent questions, you may also contact your provider using MyChart. We now offer e-Visits for anyone 8 and older to request care online for non-urgent symptoms. For details visit [email or web address].   Also download the MyChart app! Go to the app store, search "MyChart", open the app, select , and log in with your MyChart username and password.  Due to Covid, a mask is required upon entering the hospital/clinic. If you do not have a mask, one will be given to you upon arrival. For doctor visits, patients may have 1 support person aged 71 or older with them. For treatment visits, patients cannot have anyone with them due to current Covid guidelines and our immunocompromised population.

## 2021-03-31 NOTE — Assessment & Plan Note (Signed)
Rash has resolved I recommend resumption of Pomalyst I do not believe her rash is triggered by Pomalyst She will continue daratumumab/Faspro monthly She will also get calcium with vitamin D and Zometa every 6 months She will continue acyclovir for antimicrobial prophylaxis and aspirin for DVT prophylaxis I will see her again next month for further follow-up

## 2021-04-01 LAB — KAPPA/LAMBDA LIGHT CHAINS
Kappa free light chain: 22.8 mg/L — ABNORMAL HIGH (ref 3.3–19.4)
Kappa, lambda light chain ratio: 7.35 — ABNORMAL HIGH (ref 0.26–1.65)
Lambda free light chains: 3.1 mg/L — ABNORMAL LOW (ref 5.7–26.3)

## 2021-04-06 ENCOUNTER — Telehealth: Payer: Self-pay

## 2021-04-06 LAB — MULTIPLE MYELOMA PANEL, SERUM
Albumin SerPl Elph-Mcnc: 3.7 g/dL (ref 2.9–4.4)
Albumin/Glob SerPl: 1.5 (ref 0.7–1.7)
Alpha 1: 0.2 g/dL (ref 0.0–0.4)
Alpha2 Glob SerPl Elph-Mcnc: 0.8 g/dL (ref 0.4–1.0)
B-Globulin SerPl Elph-Mcnc: 1 g/dL (ref 0.7–1.3)
Gamma Glob SerPl Elph-Mcnc: 0.5 g/dL (ref 0.4–1.8)
Globulin, Total: 2.5 g/dL (ref 2.2–3.9)
IgA: 10 mg/dL — ABNORMAL LOW (ref 64–422)
IgG (Immunoglobin G), Serum: 667 mg/dL (ref 586–1602)
IgM (Immunoglobulin M), Srm: 5 mg/dL — ABNORMAL LOW (ref 26–217)
M Protein SerPl Elph-Mcnc: 0.4 g/dL — ABNORMAL HIGH
Total Protein ELP: 6.2 g/dL (ref 6.0–8.5)

## 2021-04-06 NOTE — Telephone Encounter (Signed)
Called [facility] regarding PT referral. They will call her to schedule.

## 2021-04-06 NOTE — Telephone Encounter (Signed)
-----   Message from [name], MD sent at 04/06/2021  7:46 AM EDT ----- PT neuro is still not scheduled, pls call

## 2021-04-08 ENCOUNTER — Ambulatory Visit (INDEPENDENT_AMBULATORY_CARE_PROVIDER_SITE_OTHER): Payer: Medicare Other | Admitting: Cardiology

## 2021-04-08 ENCOUNTER — Encounter (HOSPITAL_BASED_OUTPATIENT_CLINIC_OR_DEPARTMENT_OTHER): Payer: Self-pay | Admitting: Cardiology

## 2021-04-08 ENCOUNTER — Other Ambulatory Visit: Payer: Self-pay

## 2021-04-08 VITALS — BP 142/82 | HR 66 | Ht 60.75 in | Wt 110.0 lb

## 2021-04-08 DIAGNOSIS — R42 Dizziness and giddiness: Secondary | ICD-10-CM

## 2021-04-08 DIAGNOSIS — I10 Essential (primary) hypertension: Secondary | ICD-10-CM

## 2021-04-08 DIAGNOSIS — R55 Syncope and collapse: Secondary | ICD-10-CM

## 2021-04-08 DIAGNOSIS — I341 Nonrheumatic mitral (valve) prolapse: Secondary | ICD-10-CM

## 2021-04-08 NOTE — Progress Notes (Signed)
Cardiology Office Note:    Date:  04/08/2021   ID:  Jessica [name], DOB 1951/06/30, MRN [id number]  PCP:  Josetta Huddle, MD  Cardiologist:  Buford Dresser, MD  Referring MD: [name], MD   CC: new patient evaluation for syncope/dizziness   History of Present Illness:    Jessica [name] is a 70 y.o. female with a hx of multiple myeloma, and mitral valve prolapse who is seen as a new consult at the request of [name], MD for the evaluation and management of postural dizziness.  Today: Overall, she is feeling okay. She is accompanied by a family member, who also provides some history.   About a month ago, she became dizzy and fell in the hallway at home. She is unsure if she rose from bed too quickly. She does not remember the fall itself. She is unsure about duration/loss of consciousness but remembers wondering how she got on the floor. This episode of dizziness and fall has not recurred, and she notes this was the first time she has fallen in this manner. After her fall, she visited her oncologist, who then directed her to the ER for further evaluation. She did have abrasions and a laceration that was treated in the ER on 03/09/21.  When she does feel dizzy/lightheaded, she typically does not notice the room spinning or moving.  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 For the past two weeks she has not been nauseated as much.   She tries to eat and drink well, though drinking water does make her go to the bathroom frequently. She has a history of hypertension and has been on a low dose of triamterene-HCTZ for some time.  She denies any palpitations, chest pain, or shortness of breath. No headaches, orthopnea, or PND. Also has no lower extremity edema or exertional symptoms.   Past Medical History:  Diagnosis Date   H/O stem cell transplant [facility]) 03/2015   [facility]   Multiple myeloma [facility]) 11/19/2014    Multiple myeloma ([place])    Multiple myeloma ([facility])    MVP (mitral valve prolapse)    req prophylaxis   Other constipation 12/03/2014   Right ovarian cyst 10/16/15   16 mm simple cyst - ultrasound yearly.   Upper respiratory infection, acute 08/12/2015    Past Surgical History:  Procedure Laterality Date   CATARACT EXTRACTION, BILATERAL  10/2010   Implants(ReSTOR) Bilat   LASER ABLATION OF CONDYLOMAS  1990   CIN 1 cervix   multiple myeloma surgery Right 08/2014   [facility]    Current Medications: Current Outpatient Medications on File Prior to Visit  Medication Sig   acetaminophen (TYLENOL) 500 MG tablet Take 500-1,000 mg by mouth every 6 (six) hours as needed for mild pain, moderate pain, fever or headache.   acyclovir (ZOVIRAX) 400 MG tablet TAKE 1 TABLET BY MOUTH TWICE DAILY   aspirin EC 81 MG tablet Take 81 mg by mouth daily with breakfast.   calcium carbonate (OSCAL) 1500 (600 Ca) MG TABS tablet Take 1,500 mg by mouth daily with breakfast.   Calcium Carbonate 500 MG CHEW Chew 500 mg by mouth as needed for indigestion.   Cholecalciferol (VITAMIN D3) 2000 units capsule Take 2,000 Units by mouth daily.    fluorometholone (FML) 0.1 % ophthalmic suspension Place 1 drop into both eyes 3 (three) times daily.   loratadine (CLARITIN) 10 MG tablet Take 10 mg by mouth daily.  Multiple Vitamins-Minerals (CENTRUM SILVER PO) Take 1 tablet by mouth daily.    omeprazole (PRILOSEC) 20 MG capsule TAKE 1 CAPSULE(20 MG) BY MOUTH DAILY   ondansetron (ZOFRAN) 8 MG tablet TAKE 1 TABLET(8 MG) BY MOUTH EVERY 8 HOURS AS NEEDED FOR NAUSEA OR VOMITING   pomalidomide (POMALYST) 2 MG capsule Take 1 pill daily for 14 days then rest 7 days for cycle of every 21 days   senna-docusate (SENOKOT-S) 8.6-50 MG tablet Take 1-2 tablets by mouth daily as needed for mild constipation or moderate constipation.    sodium chloride (OCEAN) 0.65 % SOLN nasal spray Place 1 spray into both nostrils as needed for  congestion.   triamcinolone ointment (KENALOG) 0.1 % Apply 1 application topically daily.   triamterene-hydrochlorothiazide (MAXZIDE-25) 37.5-25 MG tablet Take 0.5 tablets by mouth daily.   vitamin B-12 (CYANOCOBALAMIN) 500 MCG tablet Take 500 mcg by mouth daily.   Current Facility-Administered Medications on File Prior to Visit  Medication   0.9 %  sodium chloride infusion     Allergies:   Augmentin [amoxicillin-pot clavulanate], Albuterol, Codeine, Ibuprofen, Nsaids, Tolmetin, and Tetanus-diphth-acell pertussis   Social History   Tobacco Use   Smoking status: Never   Smokeless tobacco: Never  Vaping Use   Vaping Use: Never used  Substance Use Topics   Alcohol use: No    Alcohol/week: 0.0 standard drinks   Drug use: No    Family History: family history includes Diabetes in her father; Osteoporosis in her mother.  ROS:   Please see the history of present illness.  Additional pertinent ROS: Constitutional: Negative for chills, fever, night sweats, unintentional weight loss  HENT: Negative for ear pain and hearing loss.   Eyes: Negative for loss of vision and eye pain.  Respiratory: Negative for cough, sputum, wheezing.   Cardiovascular: See HPI. Gastrointestinal: Positive for nausea. Negative for abdominal pain, melena, and hematochezia.  Genitourinary: Negative for dysuria and hematuria.  Musculoskeletal: Positive for falls. Negative for myalgias.  Skin: Negative for itching and rash.  Neurological: Positive for dizziness. Negative for focal weakness, focal sensory changes and loss of consciousness.  Endo/Heme/Allergies: Positive for easy bruising. Does not bleed easily.     EKGs/Labs/Other Studies Reviewed:    The following studies were reviewed today:  Left UE Venous Duplex 12/15/2014: Summary:  No evidence of deep vein thrombosis involving the left upper  extremity. 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 Also noted in a superficial thrombosis of the  cephalic vein coursing from the mid forearm to just above the [facility]  fossa.   EKG:  EKG is personally reviewed.   04/08/2021: NSR at 66 bpm  Recent Labs: 03/31/2021: ALT 12; BUN 13; Creatinine, Ser 0.90; Hemoglobin 10.6; Platelets 271; Potassium 3.9; Sodium 135  Recent Lipid Panel No results found for: CHOL, TRIG, HDL, CHOLHDL, VLDL, LDLCALC, LDLDIRECT  Physical Exam:    VS:  BP (!) 142/82   Pulse 66   Ht 5' 0.75" (1.543 m)   Wt 110 lb (49.9 kg)   LMP 08/10/2003   SpO2 100%   BMI 20.96 kg/m    Orthostatic VS for the past 24 hrs (Last 3 readings):  BP- Lying Pulse- Lying BP- Sitting Pulse- Sitting BP- Standing at 0 minutes Pulse- Standing at 0 minutes BP- Standing at 3 minutes Pulse- Standing at 3 minutes  04/08/21 0940 126/69 68 118/82 66 114/85 69 116/82 73     Wt Readings from Last 3 Encounters:  04/08/21 110 lb (49.9 kg)  03/31/21 110 lb (49.9 kg)  03/19/21 107 lb 8 oz (48.8 kg)    GEN: Well nourished, well developed in no acute distress HEENT: Normal, moist mucous membranes NECK: No JVD CARDIAC: regular rhythm, normal S1 and S2, no rubs or gallops. No murmur. VASCULAR: Radial and DP pulses 2+ bilaterally. No carotid bruits RESPIRATORY:  Clear to auscultation without rales, wheezing or rhonchi  ABDOMEN: Soft, non-tender, non-distended MUSCULOSKELETAL:  Ambulates independently SKIN: Warm and dry, no edema NEUROLOGIC:  Alert and oriented x 3. No focal neuro deficits noted. PSYCHIATRIC:  Normal affect    ASSESSMENT:    1. Syncope and collapse   2. MVP (mitral valve prolapse)   3. Postural dizziness   4. Essential hypertension    PLAN:    Syncope Postural dizziness -single event, resulting in a fall -she does not drive chronically -orthostatics negative today -no post-syndromic symptoms -did result in  injury -we discussed the multiple possible etiologies of syncope, including both cardiac and noncardiac causes -we discussed high risk features to watch for; none noted on interview today -we discussed guideline recommended evaluation. ECG today unremarkable, will order echocardiogram to rule out structural heart disease -if no clear cardiac or neurologic etiology found, we discussed reflex syncope and management strategies   History of mitral valve prolapse:  -no murmur or click on exam -she has been taking amoxicillin prior to dental procedures for years. Discussed current guidelines, that this is no longer recommended  Hypertension -on triamterene-HCTZ chronically -reports good hydration -if additional syncopal episode recurs, would change antihypertensive to avoid dehydration risk  Cardiac risk counseling and prevention recommendations: -recommend heart healthy/Mediterranean diet, with whole grains, fruits, vegetable, fish, lean meats, nuts, and olive oil. Limit salt. -recommend moderate walking, 3-5 times/week for 30-50 minutes each session. Aim for at least 150 minutes.week. Goal should be pace of 3 miles/hours, or walking 1.5 miles in 30 minutes -recommend avoidance of tobacco products. Avoid excess alcohol.  Plan for follow up: PRN if echo is unremarkable.  Buford Dresser, MD, PhD, [facility]    Medication Adjustments/Labs and Tests Ordered: Current medicines are reviewed at length with the patient today.  Concerns regarding medicines are outlined above.   Orders Placed This Encounter  Procedures   EKG 12-Lead   ECHOCARDIOGRAM COMPLETE    No orders of the defined types were placed in this encounter.  Patient Instructions  Medication Instructions:  Your physician recommends that you continue on your current medications as directed. Please refer to the Current Medication list given to you today.  *If you need a refill on your cardiac  medications before your next appointment, please call your pharmacy*   Lab Work: None If you have labs (blood work) drawn today and your tests are completely normal, you will receive your results only by: [facility] (if you have MyChart) OR A paper copy in the mail If you have any lab test that is abnormal or we need to change your treatment, we will call you to review the results.   Testing/Procedures: Your physician has requested that you have an echocardiogram. Echocardiography is a painless test that uses sound waves to create images of your heart. It provides your doctor with information about the size and shape of your heart and how well your heart's chambers and valves are working. This  procedure takes approximately one hour. There are no restrictions for this procedure.    Follow-Up: At [facility], you and your health needs are our priority.  As part of our continuing mission to provide you with exceptional heart care, we have created designated Provider Care Teams.  These Care Teams include your primary Cardiologist (physician) and Advanced Practice Providers (APPs -  Physician Assistants and Nurse Practitioners) who all work together to provide you with the care you need, when you need it.   Your next appointment:   AS NEEDED   I,Mathew Stumpf,acting as a scribe for [employer], MD.,have documented all relevant documentation on the behalf of Buford Dresser, MD,as directed by  Buford Dresser, MD while in the presence of Buford Dresser, MD.  I, Buford Dresser, MD, have reviewed all documentation for this visit. The documentation on 04/08/21 for the exam, diagnosis, procedures, and orders are all accurate and complete.   Signed, Buford Dresser, MD PhD 04/08/2021 12:02 PM    [place]

## 2021-04-08 NOTE — Patient Instructions (Signed)
Medication Instructions:  Your physician recommends that you continue on your current medications as directed. Please refer to the Current Medication list given to you today.  *If you need a refill on your cardiac medications before your next appointment, please call your pharmacy*   Lab Work: None If you have labs (blood work) drawn today and your tests are completely normal, you will receive your results only by: [place] (if you have MyChart) OR A paper copy in the mail If you have any lab test that is abnormal or we need to change your treatment, we will call you to review the results.   Testing/Procedures: Your physician has requested that you have an echocardiogram. Echocardiography is a painless test that uses sound waves to create images of your heart. It provides your doctor with information about the size and shape of your heart and how well your heart's chambers and valves are working. This procedure takes approximately one hour. There are no restrictions for this procedure.    Follow-Up: At [facility], you and your health needs are our priority.  As part of our continuing mission to provide you with exceptional heart care, we have created designated Provider Care Teams.  These Care Teams include your primary Cardiologist (physician) and Advanced Practice Providers (APPs -  Physician Assistants and Nurse Practitioners) who all work together to provide you with the care you need, when you need it.   Your next appointment:   AS NEEDED

## 2021-04-10 ENCOUNTER — Telehealth: Payer: Self-pay

## 2021-04-10 NOTE — Telephone Encounter (Signed)
-----   Message from [name], MD sent at 04/10/2021  7:49 AM EDT ----- Pls let her know myeloma panel is a bit better

## 2021-04-10 NOTE — Telephone Encounter (Signed)
Called and given below message. She verbalized understanding. 

## 2021-04-14 ENCOUNTER — Other Ambulatory Visit: Payer: Self-pay

## 2021-04-14 ENCOUNTER — Ambulatory Visit: Payer: Medicare Other | Attending: Hematology and Oncology | Admitting: Physical Therapy

## 2021-04-14 DIAGNOSIS — H8112 Benign paroxysmal vertigo, left ear: Secondary | ICD-10-CM | POA: Insufficient documentation

## 2021-04-14 DIAGNOSIS — R42 Dizziness and giddiness: Secondary | ICD-10-CM | POA: Insufficient documentation

## 2021-04-14 DIAGNOSIS — M6281 Muscle weakness (generalized): Secondary | ICD-10-CM | POA: Insufficient documentation

## 2021-04-14 DIAGNOSIS — R2681 Unsteadiness on feet: Secondary | ICD-10-CM | POA: Diagnosis present

## 2021-04-14 DIAGNOSIS — Z9181 History of falling: Secondary | ICD-10-CM | POA: Insufficient documentation

## 2021-04-14 DIAGNOSIS — R262 Difficulty in walking, not elsewhere classified: Secondary | ICD-10-CM | POA: Diagnosis present

## 2021-04-15 ENCOUNTER — Other Ambulatory Visit: Payer: Self-pay

## 2021-04-15 DIAGNOSIS — C9002 Multiple myeloma in relapse: Secondary | ICD-10-CM

## 2021-04-15 MED ORDER — POMALIDOMIDE 2 MG PO CAPS: ORAL_CAPSULE | ORAL | 0 refills | Status: DC

## 2021-04-15 NOTE — Therapy (Signed)
[facility] [address] [place], [place], [address] Phone: [phone]   Fax:  [phone]  Physical Therapy Evaluation  Patient Details  Name: Jessica [name] MRN: [id number] Date of Birth: 1950-12-03 Referring Provider (PT): [name], MD   Encounter Date: 04/14/2021   PT End of Session - 04/15/21 1028     Visit Number 1    Number of Visits 17    Date for PT Re-Evaluation 06/14/21    Authorization Type [employer] Medicare    Progress Note Due on Visit 10    PT Start Time 1103    PT Stop Time 1145    PT Time Calculation (min) 42 min    Activity Tolerance Patient limited by fatigue;Other (comment)   nausea   Behavior During Therapy [facility] for tasks assessed/performed             Past Medical History:  Diagnosis Date   H/O stem cell transplant [facility]) 03/2015   [facility]   Multiple myeloma [facility]) 11/19/2014   Multiple myeloma ([place])    Multiple myeloma ([facility])    MVP (mitral valve prolapse)    req prophylaxis   Other constipation 12/03/2014   Right ovarian cyst 10/16/15   16 mm simple cyst - ultrasound yearly.   Upper respiratory infection, acute 08/12/2015    Past Surgical History:  Procedure Laterality Date   CATARACT EXTRACTION, BILATERAL  10/2010   Implants(ReSTOR) Bilat   LASER ABLATION OF CONDYLOMAS  1990   CIN 1 cervix   multiple myeloma surgery Right 08/2014   [facility]    There were no vitals filed for this visit.    Subjective Assessment - 04/14/21 1107     Subjective Brain surgery in 2016 and has experienced nausea, vomiting, and dizziness ever since surgery.  Happens intermittently.  Has seen GI and cardiology without any abnormal findings.  Feels like the dizziness is worse over the past couple of weeks.  Has only fallen one time.  Reports no dizziness when sitting; occurs when she first stands up and when walking.  Feels very unsteady and has to hold on to husband.    Patient is accompained  by: Family member    Pertinent History multiple myeloma - radiation, chemo, stem cell transplant, bone marrow transplant, mitral valve prolapse, R ovarian cyst, bilateral cataract extraction    Limitations Standing;Walking    Diagnostic tests --    Patient Stated Goals To be more active with less dizziness and nausea    Currently in Pain? No/denies                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 1    Has the patient had a decrease in activity level because of a fear of falling?  Yes    Is the patient reluctant to leave their home because of a fear of falling?  Yes  Home Environment   Living Environment Private residence    Living Arrangements Spouse/significant other    Type of [place] to enter    Entrance Stairs-Number of Steps 2    Entrance Stairs-Rails None    Home Layout Two level;Able to live on main level with bedroom/bathroom    Home Equipment None    Additional Comments Does not go upstairs at home; holds on to husband when negotiating stairs to enter, no longer cooking due to vision; no longer driving      Prior Function   Level of Independence Independent with basic ADLs;Needs assistance with homemaking;Needs assistance with gait    Leisure Likes to go to the mall but has not been going out since COVID due to risk of infection      Observation/Other Assessments   Focus on Therapeutic Outcomes (FOTO)  Not assessed      Sensation   Light  Touch --   LE can go numb at night   Hot/Cold Appears Intact    Additional Comments Tumor affected R optic nerve; 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  Denies double vision.      ROM / Strength   AROM / PROM / Strength Strength      Strength   Overall Strength Deficits    Overall Strength Comments hip flexion 3+/5, knee flexion and extension 4-/5, ankle DF 3+/5      Transfers   Transfers Sit to Stand;Stand to Sit    Sit to Stand 4: Min assist    Sit to Stand Details (indicate cue type and reason) husband holds on to patient during sit > stand due to instability    Stand to Sit 4: Min assist    Stand to Sit Details husband holds on to patient to stabilize during stand > sit      Ambulation/Gait   Ambulation/Gait Yes    Ambulation/Gait Assistance 4: Min assist    Ambulation/Gait Assistance Details Husband holds to patient's arm during ambulation for safety due to instability.  Pt is not open to using cane or walker.  Holds furniture at home; husband when in community.    Ambulation Distance (Feet) 100 Feet    Assistive device 1 person hand held assist    Gait Pattern Step-through pattern;Decreased step length - right;Decreased step length - left;Decreased stride length;Decreased trunk rotation    Ambulation Surface Level;Indoor      Standardized Balance Assessment   Standardized Balance Assessment Timed Up and Go Test;Berg Balance Test      Berg Balance Test   Sit to Stand Able to stand  independently using hands    Standing Unsupported Able to stand 2 minutes with supervision    Sitting with Back Unsupported but Feet Supported on Floor or Stool Able to sit safely and securely 2 minutes    Stand to Sit Controls descent by using hands    Transfers Needs one person to assist    Standing Unsupported with Eyes Closed Able to stand 10 seconds with supervision    Standing Unsupported with Feet Together Needs help to attain position but able to stand for 30 seconds with feet together    From  Standing, Reach Forward with Outstretched Arm Can reach forward >12 cm safely (5")    From Standing Position, Pick up Object from Floor Able to pick up shoe, needs supervision    From Standing Position, Turn to Look Behind Over each Shoulder Needs supervision when  turning    Turn 360 Degrees Needs close supervision or verbal cueing    Standing Unsupported, Alternately Place Feet on Step/Stool Needs assistance to keep from falling or unable to try    Standing Unsupported, One Foot in Front Needs help to step but can hold 15 seconds    Standing on One Leg Unable to try or needs assist to prevent fall    Total Score 27    Berg comment: 27/56; required multiple seated rest breaks due to nausea, "dizziness" and fatigue      Timed Up and Go Test   TUG Comments not able to perform today due to fatigue, nausea                    Vestibular Assessment - 04/15/21 1026       Vestibular Assessment   General Observation TBA at next session              Objective measurements completed on examination: See above findings.      PT Education - 04/15/21 1027     Education Details clinical findings, PT POC, goals, focus of next session, importance of gradually increasing activity level to counteract deconditioning    Person(s) Educated Patient;Spouse    Methods Explanation    Comprehension Verbalized understanding              PT Short Term Goals - 04/15/21 1039       PT SHORT TERM GOAL #1   Title Pt will tolerate further assessment of falls risk and vestibular impairments    Baseline TUG and vestibular TBA    Time 4    Period Weeks    Status New    Target Date 05/15/21      PT SHORT TERM GOAL #2   Title Pt will initiate seated and intermittent standing home exercise program and will perform 2-3x/week with husband's supervision    Time 4    Period Weeks    Status New    Target Date 05/15/21      PT SHORT TERM GOAL #3   Title Pt will demonstrate 4 point  improvement in BERG to indicate decreased falls risk    Baseline 27/56    Time 4    Period Weeks    Status New    Target Date 05/15/21      PT SHORT TERM GOAL #4   Title Pt will perform sit <> stand and ambulate x 115' over level, indoor surfaces with supervision to improve safety when ambulating in her home (without husband's assistance)    Time 4    Period Weeks    Status New    Target Date 05/15/21      PT SHORT TERM GOAL #5   Title Pt will negotiate 4 stairs with one rail and supervision to improve safety when entering/exiting home    Time 4    Period Weeks    Status New    Target Date 05/15/21               PT Long Term Goals - 04/15/21 1042       PT LONG TERM GOAL #1   Title Pt will be able to perform standing HEP 3-4x/week with supervision    Time 8    Period Weeks    Status New    Target Date 06/14/21      PT LONG TERM GOAL #2   Title Vestibular goal as indicated  PT LONG TERM GOAL #3   Title Pt will increase BERG to >/= 36/56 to indicate decreased falls risk    Time 8    Period Weeks    Status New    Target Date 06/14/21      PT LONG TERM GOAL #4   Title Pt will decrease time to perform TUG to </= 15 seconds with supervision    Baseline TBA    Time 8    Period Weeks    Status New    Target Date 06/14/21      PT LONG TERM GOAL #5   Title Pt will ambulate x 200' outside over paved surfaces with LRAD and supervision and will negotiate 8 stairs with one rail with supervision    Time 8    Period Weeks    Status New    Target Date 06/14/21                    Plan - 04/15/21 1029     Clinical Impression Statement Pt is a 70 year old female referred to Neuro OPPT for evaluation of dizziness, falls and balance 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  Pt's PMH is significant for the following: h/o stem cell transplant, multiple myeloma, mitral valve prolapse, R ovarian cyst, bilateral cataract extraction, fall. The  following deficits were noted during pt's exam: impaired bilateral lower extremity strength, impaired functional endurance and activity tolerance, dizziness/disequilibrium, impaired vision, impaired standing balance, and impaired gait.  Pt's BERG score indicates pt is at high risk for falls. Pt would benefit from skilled PT to address these impairments and functional limitations to maximize functional mobility independence and reduce falls risk.    Personal Factors and Comorbidities Behavior Pattern;Comorbidity 3+;Past/Current Experience;Time since onset of injury/illness/exacerbation    Comorbidities h/o stem cell transplant, multiple myeloma, mitral valve prolapse, R ovarian cyst, bilateral cataract extraction, fall    Examination-Activity Limitations Locomotion Level;Stairs;Stand    Examination-Participation Restrictions Community Activity;Meal Prep    Stability/Clinical Decision Making Evolving/Moderate complexity    Clinical Decision Making Moderate    Rehab Potential Fair    PT Frequency 2x / week    PT Duration 8 weeks    PT Treatment/Interventions ADLs/Self Care Home Management;Canalith Repostioning;DME Instruction;Gait training;Stair training;Functional mobility training;Therapeutic activities;Therapeutic exercise;Balance training;Neuromuscular re-education;Patient/family education;Energy conservation;Vestibular    PT Next Visit Plan TUG, vestibular assessment.  Educate on 3 balance systems.  Initiate HEP    Consulted and Agree with Plan of Care Patient;Family member/caregiver    Family Member Consulted Husband             Patient will benefit from skilled therapeutic intervention in order to improve the following deficits and impairments:  Abnormal gait, Decreased activity tolerance, Decreased balance, Decreased endurance, Decreased mobility, Decreased strength, Difficulty walking, Dizziness, Impaired perceived functional ability, Impaired vision/preception  Visit Diagnosis: History  of falling  Dizziness and giddiness  Unsteadiness on feet  Muscle weakness (generalized)  Difficulty in walking, not elsewhere classified     Problem List Patient Active Problem List   Diagnosis Date Noted   Drug-induced skin rash 03/11/2021   Fall against sharp object, initial encounter 03/09/2021   Deficiency anemia 02/24/2021   Vitamin B12 deficiency 01/27/2021   Nausea with vomiting 12/31/2020   Physical debility 10/06/2020   Irritation of right eye 09/08/2020   Immunocompromised state due to drug therapy ([place]) 05/19/2020   Preventive measure 04/21/2020   Alopecia 08/21/2019   Hypomagnesemia 04/25/2019   Right sided  temporal headache 04/13/2019   Goals of care, counseling/discussion 04/13/2019   Hypocalcemia 01/11/2019   Osteopenia 06/05/2018   Weight loss 04/14/2018   Elevated blood pressure reading in office without diagnosis of hypertension 01/12/2018   Diarrhea 10/13/2017   Postural dizziness 10/13/2017   Right ovarian cyst 05/13/2017   Chronic GERD 01/13/2017   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 08/10/2016   Hypokalemia 07/16/2016   Pneumonia 07/13/2016   Atypical chest pain 07/13/2016   Chest pain on breathing 07/12/2016   Mucositis due to antineoplastic therapy 07/09/2016   Candidal esophagitis ([place]) 04/30/2016   Herpes simplex 04/30/2016   Chronic kidney disease, stage III (moderate) ([place]) 03/12/2016   Hypokalemia, gastrointestinal losses 01/16/2016   Pancytopenia, acquired ([place]) 10/20/2015   Nasal congestion 10/20/2015   Varicose vein of leg 08/25/2015   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/22/2015   Hypotension due to drugs 04/22/2015   Petechiae 02/14/2015   Neuropathy due to chemotherapeutic drug ([place]) 02/07/2015   Essential hypertension 02/07/2015   Infection of eyelid 01/16/2015   Protein calorie malnutrition ([place]) 01/16/2015   Superficial thrombophlebitis of upper extremity 12/24/2014   Sty, external 12/24/2014   Other constipation 12/03/2014    Dehydration 12/02/2014   Weakness 12/02/2014   Multiple myeloma in relapse ([place]) 11/19/2014   Prerenal renal failure 11/19/2014   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[place]) 11/19/2014   Neuropathic pain of left flank 11/19/2014    Rico Junker, PT, DPT 04/15/21    10:46 AM   [facility] [address] [place], [place], [address] Phone: [phone]   Fax:  [phone]  Name: Jessica [name] MRN: [id number] Date of Birth: [birth year]

## 2021-04-16 ENCOUNTER — Other Ambulatory Visit: Payer: Self-pay

## 2021-04-16 ENCOUNTER — Ambulatory Visit: Payer: Medicare Other | Admitting: Physical Therapy

## 2021-04-16 DIAGNOSIS — Z9181 History of falling: Secondary | ICD-10-CM | POA: Diagnosis not present

## 2021-04-16 DIAGNOSIS — R42 Dizziness and giddiness: Secondary | ICD-10-CM

## 2021-04-16 DIAGNOSIS — R2681 Unsteadiness on feet: Secondary | ICD-10-CM

## 2021-04-16 DIAGNOSIS — R262 Difficulty in walking, not elsewhere classified: Secondary | ICD-10-CM

## 2021-04-16 DIAGNOSIS — H8112 Benign paroxysmal vertigo, left ear: Secondary | ICD-10-CM

## 2021-04-16 DIAGNOSIS — M6281 Muscle weakness (generalized): Secondary | ICD-10-CM

## 2021-04-16 NOTE — Therapy (Signed)
[facility] [address] [place], [place], [address] Phone: [phone]   Fax:  [phone]  Physical Therapy Treatment  Patient Details  Name: Jessica [name] MRN: [id number] Date of Birth: [birth year] Referring Provider (PT): [name], MD   Encounter Date: 04/16/2021   PT End of Session - 04/16/21 1356     Visit Number 2    Number of Visits 17    Date for PT Re-Evaluation 06/14/21    Authorization Type [employer] Medicare    Progress Note Due on Visit 10    PT Start Time 0939    PT Stop Time 1020    PT Time Calculation (min) 41 min    Activity Tolerance Other (comment)   dizziness and nausea   Behavior During Therapy [facility] for tasks assessed/performed             Past Medical History:  Diagnosis Date   H/O stem cell transplant ([place]) 03/2015   [facility]   Multiple myeloma ([place]) 11/19/2014   Multiple myeloma ([place])    Multiple myeloma ([place])    MVP (mitral valve prolapse)    req prophylaxis   Other constipation 12/03/2014   Right ovarian cyst 10/16/15   16 mm simple cyst - ultrasound yearly.   Upper respiratory infection, acute 08/12/2015    Past Surgical History:  Procedure Laterality Date   CATARACT EXTRACTION, BILATERAL  10/2010   Implants(ReSTOR) Bilat   LASER ABLATION OF CONDYLOMAS  1990   CIN 1 cervix   multiple myeloma surgery Right 08/2014   [facility]    There were no vitals filed for this visit.   Subjective Assessment - 04/16/21 0942     Subjective Pt feeling a little better today.  Didn't need therapist's assistance to walk back to treatment room.  Didn't feel bad after eval the other day; was able to put socks and pants on RLE today, usually needs help.    Patient is accompained by: Family member    Pertinent History multiple myeloma - radiation, chemo, stem cell transplant, bone marrow transplant, mitral valve prolapse, R ovarian cyst, bilateral cataract extraction    Limitations  Standing;Walking    Patient Stated Goals To be more active with less dizziness and nausea    Currently in Pain? No/denies                [facility] PT Assessment - 04/16/21 0944       Timed Up and Go Test   TUG Normal TUG    Normal TUG (seconds) 25    TUG Comments without AD, supervision, moves slowly and cautiously.  Moves en bloc                 Vestibular Assessment - 04/16/21 0948       Symptom Behavior   Subjective history of current problem typically feels off balance but last night pt had acute onset of spinning    Type of Dizziness  Imbalance;Comment;Spinning   wobbly; felt spinning for the first time last night   Frequency of Dizziness intermittent    Duration of Dizziness Spinning lasted only a few seconds; imbalance lasts until pt sits down    Symptom Nature Intermittent    Aggravating Factors Activity in general    Relieving Factors Rest;Comments   sit down   Progression of Symptoms No change since onset      Oculomotor Exam   Spontaneous Absent    Gaze-induced  Absent  Smooth Pursuits Saccades    Saccades Poor trajectory      Oculomotor Exam-Fixation Suppressed    Left Head Impulse positive    Right Head Impulse negative      Vestibulo-Ocular Reflex   VOR to Slow Head Movement Normal    VOR Cancellation Corrective saccades      Positional Testing   Dix-Hallpike Dix-Hallpike Right;Dix-Hallpike Left    Horizontal Canal Testing Horizontal Canal Right;Horizontal Canal Left      Dix-Hallpike Right   Dix-Hallpike Right Duration 0    Dix-Hallpike Right Symptoms No nystagmus      Dix-Hallpike Left   Dix-Hallpike Left Duration 10 seconds    Dix-Hallpike Left Symptoms Other (comment)   vertigo but unable to observe nystagmus     Horizontal Canal Right   Horizontal Canal Right Duration 0    Horizontal Canal Right Symptoms Normal      Horizontal Canal Left   Horizontal Canal Left Duration 5    Horizontal Canal Left Symptoms Normal       Positional Sensitivities   Sit to Supine No dizziness    Supine to Left Side Lightheadedness    Supine to Right Side No dizziness    Supine to Sitting Mild dizziness    Right Hallpike No dizziness    Up from Right Hallpike Mild dizziness    Up from Left Hallpike Mild dizziness    Nose to Right Knee Lightheadedness    Right Knee to Sitting Lightedness    Nose to Left Knee Lightheadedness    Left Knee to Sitting Lightheadedness    Head Turning x 5 Lightheadedness    Head Nodding x 5 No dizziness                       Vestibular Treatment/Exercise - 04/16/21 1017       Vestibular Treatment/Exercise   Vestibular Treatment Provided Canalith Repositioning    Canalith Repositioning Epley Manuever Left       EPLEY MANUEVER LEFT   Number of Reps  1     RESPONSE DETAILS LEFT severe vertigo when returning to sitting after CRM.                    PT Education - 04/16/21 1356     Education Details vestibular findings, BPPV, treatment    Person(s) Educated Patient    Methods Explanation    Comprehension Verbalized understanding              PT Short Term Goals - 04/15/21 1039       PT SHORT TERM GOAL #1   Title Pt will tolerate further assessment of falls risk and vestibular impairments    Baseline TUG and vestibular TBA    Time 4    Period Weeks    Status New    Target Date 05/15/21      PT SHORT TERM GOAL #2   Title Pt will initiate seated and intermittent standing home exercise program and will perform 2-3x/week with husband's supervision    Time 4    Period Weeks    Status New    Target Date 05/15/21      PT SHORT TERM GOAL #3   Title Pt will demonstrate 4 point improvement in BERG to indicate decreased falls risk    Baseline 27/56    Time 4    Period Weeks    Status New    Target Date 05/15/21  PT SHORT TERM GOAL #4   Title Pt will perform sit <> stand and ambulate x 115' over level, indoor surfaces with supervision to improve  safety when ambulating in her home (without husband's assistance)    Time 4    Period Weeks    Status New    Target Date 05/15/21      PT SHORT TERM GOAL #5   Title Pt will negotiate 4 stairs with one rail and supervision to improve safety when entering/exiting home    Time 4    Period Weeks    Status New    Target Date 05/15/21               PT Long Term Goals - 04/16/21 1400       PT LONG TERM GOAL #1   Title Pt will be able to perform standing HEP 3-4x/week with supervision (All LTG due 06/14/21)    Time 8    Period Weeks    Status New      PT LONG TERM GOAL #2   Title Pt will demonstrate negative positional testing and will report 0-1/5 for all movements on MSQ    Baseline L BPPV, mild-moderate dizziness on MSQ    Time 8    Period Weeks    Status New      PT LONG TERM GOAL #3   Title Pt will increase BERG to >/= 36/56 to indicate decreased falls risk    Time 8    Period Weeks    Status New      PT LONG TERM GOAL #4   Title Pt will decrease time to perform TUG to </= 15 seconds with supervision    Baseline 25 seconds    Time 8    Period Weeks    Status New      PT LONG TERM GOAL #5   Title Pt will ambulate x 200' outside over paved surfaces with LRAD and supervision and will negotiate 8 stairs with one rail with supervision    Time 8    Period Weeks    Status New                   Plan - 04/16/21 1357     Clinical Impression Statement Pt demonstrated improved activity tolerance today and decreased dizziness and nausea with balance testing today.  Pt does demonstrate high falls risk and moves en bloc as indicated by TUG.  Vestibular assessment revealed impaired VOR gain L side, motion sensitivity and L posterior canal BPPV, treated x 1 with CRM.  Will continue to address and LTG updated.    Personal Factors and Comorbidities Behavior Pattern;Comorbidity 3+;Past/Current Experience;Time since onset of injury/illness/exacerbation    Comorbidities  h/o stem cell transplant, multiple myeloma, mitral valve prolapse, R ovarian cyst, bilateral cataract extraction, fall    Examination-Activity Limitations Locomotion Level;Stairs;Stand    Examination-Participation Restrictions Community Activity;Meal Prep    Stability/Clinical Decision Making Evolving/Moderate complexity    Rehab Potential Fair    PT Frequency 2x / week    PT Duration 8 weeks    PT Treatment/Interventions ADLs/Self Care Home Management;Canalith Repostioning;DME Instruction;Gait training;Stair training;Functional mobility training;Therapeutic activities;Therapeutic exercise;Balance training;Neuromuscular re-education;Patient/family education;Energy conservation;Vestibular    PT Next Visit Plan re-assess and treat L BPPV.  Educate on 3 balance systems.  Initiate HEP - balance and VOR    Consulted and Agree with Plan of Care Patient  Patient will benefit from skilled therapeutic intervention in order to improve the following deficits and impairments:  Abnormal gait, Decreased activity tolerance, Decreased balance, Decreased endurance, Decreased mobility, Decreased strength, Difficulty walking, Dizziness, Impaired perceived functional ability, Impaired vision/preception  Visit Diagnosis: BPPV (benign paroxysmal positional vertigo), left  History of falling  Unsteadiness on feet  Dizziness and giddiness  Muscle weakness (generalized)  Difficulty in walking, not elsewhere classified     Problem List Patient Active Problem List   Diagnosis Date Noted   Drug-induced skin rash 03/11/2021   Fall against sharp object, initial encounter 03/09/2021   Deficiency anemia 02/24/2021   Vitamin B12 deficiency 01/27/2021   Nausea with vomiting 12/31/2020   Physical debility 10/06/2020   Irritation of right eye 09/08/2020   Immunocompromised state due to drug therapy ([place]) 05/19/2020   Preventive measure 04/21/2020   Alopecia 08/21/2019   Hypomagnesemia  04/25/2019   Right sided temporal headache 04/13/2019   Goals of care, counseling/discussion 04/13/2019   Hypocalcemia 01/11/2019   Osteopenia 06/05/2018   Weight loss 04/14/2018   Elevated blood pressure reading in office without diagnosis of hypertension 01/12/2018   Diarrhea 10/13/2017   Postural dizziness 10/13/2017   Right ovarian cyst 05/13/2017   Chronic GERD 01/13/2017   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 08/10/2016   Hypokalemia 07/16/2016   Pneumonia 07/13/2016   Atypical chest pain 07/13/2016   Chest pain on breathing 07/12/2016   Mucositis due to antineoplastic therapy 07/09/2016   Candidal esophagitis ([place]) 04/30/2016   Herpes simplex 04/30/2016   Chronic kidney disease, stage III (moderate) ([place]) 03/12/2016   Hypokalemia, gastrointestinal losses 01/16/2016   Pancytopenia, acquired ([place]) 10/20/2015   Nasal congestion 10/20/2015   Varicose vein of leg 08/25/2015   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   Hypotension due to drugs 04/22/2015   Petechiae 02/14/2015   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/07/2015   Essential hypertension 02/07/2015   Infection of eyelid 01/16/2015   Protein calorie malnutrition ([place]) 01/16/2015   Superficial thrombophlebitis of upper extremity 12/24/2014   Sty, external 12/24/2014   Other constipation 12/03/2014   Dehydration 12/02/2014   Weakness 12/02/2014   Multiple myeloma in relapse ([place]) 11/19/2014   Prerenal renal failure 11/19/2014   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[place]) 11/19/2014   Neuropathic pain of left flank 11/19/2014    Rico Junker, PT, DPT 04/16/21    2:05 PM   [facility] [address] [place], [place], [address] Phone: [phone]   Fax:  [phone]  Name: ELYZA WHITT MRN: [id number] Date of Birth: 25-Feb-1951

## 2021-04-21 ENCOUNTER — Ambulatory Visit: Payer: Medicare Other | Admitting: Physical Therapy

## 2021-04-27 ENCOUNTER — Inpatient Hospital Stay (HOSPITAL_BASED_OUTPATIENT_CLINIC_OR_DEPARTMENT_OTHER): Payer: Medicare Other | Admitting: Hematology and Oncology

## 2021-04-27 ENCOUNTER — Encounter: Payer: Self-pay | Admitting: Hematology and Oncology

## 2021-04-27 ENCOUNTER — Inpatient Hospital Stay: Payer: Medicare Other

## 2021-04-27 ENCOUNTER — Other Ambulatory Visit: Payer: Self-pay | Admitting: Hematology and Oncology

## 2021-04-27 ENCOUNTER — Inpatient Hospital Stay: Payer: Medicare Other | Attending: Hematology and Oncology

## 2021-04-27 ENCOUNTER — Other Ambulatory Visit: Payer: Self-pay

## 2021-04-27 DIAGNOSIS — Z923 Personal history of irradiation: Secondary | ICD-10-CM | POA: Diagnosis not present

## 2021-04-27 DIAGNOSIS — Z887 Allergy status to serum and vaccine status: Secondary | ICD-10-CM | POA: Diagnosis not present

## 2021-04-27 DIAGNOSIS — C9001 Multiple myeloma in remission: Secondary | ICD-10-CM

## 2021-04-27 DIAGNOSIS — Z885 Allergy status to narcotic agent status: Secondary | ICD-10-CM | POA: Diagnosis not present

## 2021-04-27 DIAGNOSIS — E538 Deficiency of other specified B group vitamins: Secondary | ICD-10-CM

## 2021-04-27 DIAGNOSIS — R531 Weakness: Secondary | ICD-10-CM | POA: Insufficient documentation

## 2021-04-27 DIAGNOSIS — D61818 Other pancytopenia: Secondary | ICD-10-CM | POA: Diagnosis not present

## 2021-04-27 DIAGNOSIS — Z881 Allergy status to other antibiotic agents status: Secondary | ICD-10-CM | POA: Insufficient documentation

## 2021-04-27 DIAGNOSIS — C9002 Multiple myeloma in relapse: Secondary | ICD-10-CM

## 2021-04-27 DIAGNOSIS — R262 Difficulty in walking, not elsewhere classified: Secondary | ICD-10-CM | POA: Insufficient documentation

## 2021-04-27 DIAGNOSIS — Z9221 Personal history of antineoplastic chemotherapy: Secondary | ICD-10-CM | POA: Insufficient documentation

## 2021-04-27 DIAGNOSIS — Z880 Allergy status to penicillin: Secondary | ICD-10-CM | POA: Diagnosis not present

## 2021-04-27 DIAGNOSIS — Z5112 Encounter for antineoplastic immunotherapy: Secondary | ICD-10-CM | POA: Insufficient documentation

## 2021-04-27 DIAGNOSIS — R42 Dizziness and giddiness: Secondary | ICD-10-CM | POA: Diagnosis not present

## 2021-04-27 DIAGNOSIS — Z7189 Other specified counseling: Secondary | ICD-10-CM

## 2021-04-27 DIAGNOSIS — Z886 Allergy status to analgesic agent status: Secondary | ICD-10-CM | POA: Diagnosis not present

## 2021-04-27 DIAGNOSIS — Z79899 Other long term (current) drug therapy: Secondary | ICD-10-CM | POA: Insufficient documentation

## 2021-04-27 LAB — COMPREHENSIVE METABOLIC PANEL
ALT: 10 U/L (ref 0–44)
AST: 15 U/L (ref 15–41)
Albumin: 4 g/dL (ref 3.5–5.0)
Alkaline Phosphatase: 64 U/L (ref 38–126)
Anion gap: 9 (ref 5–15)
BUN: 14 mg/dL (ref 8–23)
CO2: 27 mmol/L (ref 22–32)
Calcium: 9.7 mg/dL (ref 8.9–10.3)
Chloride: 101 mmol/L (ref 98–111)
Creatinine, Ser: 1.07 mg/dL — ABNORMAL HIGH (ref 0.44–1.00)
GFR, Estimated: 56 mL/min — ABNORMAL LOW (ref >60)
Glucose, Bld: 84 mg/dL (ref 70–99)
Potassium: 3.8 mmol/L (ref 3.5–5.1)
Sodium: 137 mmol/L (ref 135–145)
Total Bilirubin: 0.6 mg/dL (ref 0.3–1.2)
Total Protein: 6.9 g/dL (ref 6.5–8.1)

## 2021-04-27 LAB — CBC WITH DIFFERENTIAL/PLATELET
Abs Immature Granulocytes: 0.08 10*3/uL — ABNORMAL HIGH (ref 0.00–0.07)
Basophils Absolute: 0.2 10*3/uL — ABNORMAL HIGH (ref 0.0–0.1)
Basophils Relative: 5 %
Eosinophils Absolute: 0.4 10*3/uL (ref 0.0–0.5)
Eosinophils Relative: 9 %
HCT: 34.1 % — ABNORMAL LOW (ref 36.0–46.0)
Hemoglobin: 11.4 g/dL — ABNORMAL LOW (ref 12.0–15.0)
Immature Granulocytes: 2 %
Lymphocytes Relative: 43 %
Lymphs Abs: 1.8 10*3/uL (ref 0.7–4.0)
MCH: 27.5 pg (ref 26.0–34.0)
MCHC: 33.4 g/dL (ref 30.0–36.0)
MCV: 82.4 fL (ref 80.0–100.0)
Monocytes Absolute: 0.3 10*3/uL (ref 0.1–1.0)
Monocytes Relative: 7 %
Neutro Abs: 1.4 10*3/uL — ABNORMAL LOW (ref 1.7–7.7)
Neutrophils Relative %: 34 %
Platelets: 396 10*3/uL (ref 150–400)
RBC: 4.14 MIL/uL (ref 3.87–5.11)
RDW: 16 % — ABNORMAL HIGH (ref 11.5–15.5)
WBC: 4.2 10*3/uL (ref 4.0–10.5)
nRBC: 0 % (ref 0.0–0.2)

## 2021-04-27 MED ORDER — ACETAMINOPHEN 325 MG PO TABS
650.0000 mg | ORAL_TABLET | Freq: Once | ORAL | Status: AC
  Administered 2021-04-27: 650 mg via ORAL
  Filled 2021-04-27: qty 2

## 2021-04-27 MED ORDER — SODIUM CHLORIDE 0.9 % IV SOLN: Freq: Once | INTRAVENOUS | Status: DC

## 2021-04-27 MED ORDER — DEXAMETHASONE 4 MG PO TABS
8.0000 mg | ORAL_TABLET | Freq: Once | ORAL | Status: AC
  Administered 2021-04-27: 8 mg via ORAL
  Filled 2021-04-27: qty 2

## 2021-04-27 MED ORDER — DARATUMUMAB-HYALURONIDASE-FIHJ 1800-30000 MG-UT/15ML ~~LOC~~ SOLN
1800.0000 mg | Freq: Once | SUBCUTANEOUS | Status: AC
  Administered 2021-04-27: 1800 mg via SUBCUTANEOUS
  Filled 2021-04-27: qty 15

## 2021-04-27 MED ORDER — MONTELUKAST SODIUM 10 MG PO TABS
10.0000 mg | ORAL_TABLET | Freq: Once | ORAL | Status: AC
  Administered 2021-04-27: 10 mg via ORAL
  Filled 2021-04-27: qty 1

## 2021-04-27 NOTE — Progress Notes (Signed)
[facility] OFFICE PROGRESS NOTE  Patient Care Team: Josetta Huddle, MD as PCP - General (Internal Medicine) Buford Dresser, MD as PCP - Cardiology (Cardiology) Ginette Pitman, MD as Consulting Physician (Hematology and Oncology) Hessie Dibble, MD as Consulting Physician (Hematology and Oncology)  ASSESSMENT & PLAN:  Multiple myeloma in relapse [facility]) I have reviewed her recent myeloma panel She has excellent response to therapy We will proceed with monthly daratumumab and Pomalyst as scheduled She had received recent Zometa She will continue calcium with vitamin D She will continue aspirin for DVT prophylaxis She will continue acyclovir for antimicrobial prophylaxis  Pancytopenia, acquired (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   All questions were answered. The patient knows to call the clinic with any problems, questions or concerns. The total time spent in the appointment was 20 minutes encounter with patients including review of chart and various tests results, discussions about plan of care and coordination of care plan   [name], MD 04/27/2021 12:17 PM  INTERVAL HISTORY: Please see below for problem oriented charting. she returns for treatment follow-up on Pomalyst and daratumumab for recurrent multiple myeloma Since last time I saw her, she shared with me and unpleasant experience that physical therapy and rehab She was placed on the supine position and complain of profound dizziness after that She continues to have profound weakness with  difficulties walking 200 feet without stopping She would like application for disability parking permit Denies further falls No recent infection, fever or chills  REVIEW OF SYSTEMS:   Constitutional: Denies fevers, chills or abnormal weight loss Eyes: Denies blurriness of vision Ears, nose, mouth, throat, and face: Denies mucositis or sore throat Respiratory: Denies cough, dyspnea or wheezes Cardiovascular: Denies palpitation, chest discomfort or lower extremity swelling Gastrointestinal:  Denies nausea, heartburn or change in bowel habits Skin: Denies abnormal skin rashes Lymphatics: Denies new lymphadenopathy or easy bruising Behavioral/Psych: Mood is stable, no new changes  All other systems were reviewed with the patient and are negative.  I have reviewed the past medical history, past surgical history, social history and family history with the patient and they are unchanged from previous note.  ALLERGIES:  is allergic to augmentin [amoxicillin-pot clavulanate], albuterol, codeine, ibuprofen, nsaids, tolmetin, and tetanus-diphth-acell pertussis.  MEDICATIONS:  Current Outpatient Medications  Medication Sig Dispense Refill   acetaminophen (TYLENOL) 500 MG tablet Take 500-1,000 mg by mouth every 6 (six) hours as needed for mild pain, moderate pain, fever or headache.     acyclovir (ZOVIRAX) 400 MG tablet TAKE 1 TABLET BY MOUTH TWICE DAILY 180 tablet 3   aspirin EC 81 MG tablet Take 81 mg by mouth daily with breakfast.     calcium carbonate (OSCAL) 1500 (600 Ca) MG TABS tablet Take 1,500 mg by mouth daily with breakfast.     Calcium Carbonate 500 MG CHEW Chew 500 mg by mouth as needed for indigestion.     Cholecalciferol (VITAMIN D3) 2000 units capsule Take 2,000 Units by mouth daily.      fluorometholone (FML) 0.1 % ophthalmic suspension Place 1 drop into both eyes 3 (three)  times daily. (Patient not taking: Reported on 04/14/2021)     loratadine (CLARITIN) 10 MG tablet Take 10 mg by  mouth daily.     Multiple Vitamins-Minerals (CENTRUM SILVER PO) Take 1 tablet by mouth daily.      omeprazole (PRILOSEC) 20 MG capsule TAKE 1 CAPSULE(20 MG) BY MOUTH DAILY 90 capsule 11   ondansetron (ZOFRAN) 8 MG tablet TAKE 1 TABLET(8 MG) BY MOUTH EVERY 8 HOURS AS NEEDED FOR NAUSEA OR VOMITING 60 tablet 11   pomalidomide (POMALYST) 2 MG capsule Take 1 pill daily for 14 days then rest 7 days for cycle of every 21 days 14 capsule 0   senna-docusate (SENOKOT-S) 8.6-50 MG tablet Take 1-2 tablets by mouth daily as needed for mild constipation or moderate constipation.      sodium chloride (OCEAN) 0.65 % SOLN nasal spray Place 1 spray into both nostrils as needed for congestion.     triamcinolone ointment (KENALOG) 0.1 % Apply 1 application topically daily.     triamterene-hydrochlorothiazide (MAXZIDE-25) 37.5-25 MG tablet Take 0.5 tablets by mouth daily.     vitamin B-12 (CYANOCOBALAMIN) 500 MCG tablet Take 500 mcg by mouth daily.     No current facility-administered medications for this visit.   Facility-Administered Medications Ordered in Other Visits  Medication Dose Route Frequency Provider Last Rate Last Admin   0.9 %  sodium chloride infusion   Intravenous Once Alvy Bimler, Amyia Lodwick, MD       0.9 %  sodium chloride infusion   Intravenous Once Alvy Bimler, Tuere Nwosu, MD       acetaminophen (TYLENOL) tablet 650 mg  650 mg Oral Once Alvy Bimler, Hubbert Landrigan, MD       daratumumab-hyaluronidase-fihj (DARZALEX FASPRO) 1800-30000 MG-UT/15ML chemo SQ injection 1,800 mg  1,800 mg Subcutaneous Once Alvy Bimler, Alena Blankenbeckler, MD       dexamethasone (DECADRON) tablet 8 mg  8 mg Oral Once Alvy Bimler, Tullio Chausse, MD       montelukast (SINGULAIR) tablet 10 mg  10 mg Oral Once [name], MD        SUMMARY OF ONCOLOGIC HISTORY: Oncology History Overview Note   M-protein 0.69 gm/dl IFIX - IgG, Kappa IgG - 868 IgA - 19 IgM - < 20 Kappa - 21 Lambda - 5.7  09/06/2014 - Bone marrow aspirate and biopsy:   Normocellular marrow for age (40%) with a small  monoclonal plasma cell population (1% on aspirate). Karyotype 67, XX  FISH Negative for myeloma associated changes  09/12/2014 - PET/CT  Two regions that are concerning for disease, one adjacent/involving the left ninth rib and one in the marrow of the right femur, in this patient with history of plasmacytoma.    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   04/01/2015 Bone Marrow Transplant   She received melphalan chemotherapy on 03/31/2015 followed by autologous stem cell transplant the day after   04/03/2015 - 04/18/2015 Hospital Admission   The patient was admitted to the hospital at [facility] for management related to complication from stem cell transplant. She had significant nausea requiring intravenous anti-emetics.   07/17/2015 -  Chemotherapy   She started maintenance Revlimid and monthly zometa, then every 3 months   06/01/2018 Imaging   DEXA scan showed bone density T score in femur -2.3   04/20/2019 PET scan   1. No FDG avid osseous  lesions or mass identified to suggest metabolically active lesion of myeloma or plasmacytoma. 2. Small nodular density within the paravertebral right lower lobe exhibits mild to moderate increased uptake within SUV max of 3.38. This is indeterminate. Review of CT chest from 10/05/2018 shows a corresponding Lung nodule in this area measuring the same. Small pulmonary neoplasm cannot be excluded. 3. Indeterminate, focal area of increased uptake is identified within the thoracic canal. Indeterminate favored to represent benign physiologic CNS activity.     04/30/2019 -  Chemotherapy      Patient is on Antibody Plan: MYELOMA SQ DARATUMUMAB FASPRO Q28D     04/30/2019 - 07/09/2019 Chemotherapy   The patient had bortezomib SQ (VELCADE) chemo injection 1.75 mg, 1.3 mg/m2 = 1.75 mg, Subcutaneous,  Once, 10 of 11 cycles Administration: 1.75 mg (04/30/2019), 1.75 mg (05/07/2019), 1.75 mg (05/14/2019), 1.75 mg (05/21/2019), 1.75 mg (05/28/2019), 1.75 mg (06/04/2019), 1.75 mg (06/11/2019), 1.75 mg (06/18/2019), 1.75 mg (06/25/2019), 1.75 mg (07/09/2019)   for chemotherapy treatment.       PHYSICAL EXAMINATION: ECOG PERFORMANCE STATUS: 1 - Symptomatic but completely ambulatory  Vitals:   04/27/21 1122  BP: 112/90  Pulse: 82  Resp: 18  Temp: (!) 97.5 F (36.4 C)  SpO2: 100%   Filed Weights   04/27/21 1122  Weight: 111 lb 6.4 oz (50.5 kg)    GENERAL:alert, no distress and comfortable   NEURO: alert & oriented x 3 with fluent speech, no focal motor/sensory deficits  LABORATORY DATA:  I have reviewed the data as listed    Component Value Date/Time   NA 137 04/27/2021 1056   NA 143 07/14/2017 1245   K 3.8 04/27/2021 1056   K 3.3 (L) 07/14/2017 1245   CL 101 04/27/2021 1056   CO2 27 04/27/2021 1056   CO2 20 (L) 07/14/2017 1245   GLUCOSE 84 04/27/2021 1056   GLUCOSE 106 07/14/2017 1245   BUN 14 04/27/2021 1056   BUN 9.3 07/14/2017 1245   CREATININE 1.07 (H) 04/27/2021 1056   CREATININE  1.01 (H) 01/27/2021 0847   CREATININE 0.8 07/14/2017 1245   CALCIUM 9.7 04/27/2021 1056   CALCIUM 8.4 07/14/2017 1245   PROT 6.9 04/27/2021 1056   PROT 6.0 07/14/2017 1245   PROT 6.2 (L) 07/14/2017 1245   ALBUMIN 4.0 04/27/2021 1056   ALBUMIN 3.6 07/14/2017 1245   AST 15 04/27/2021 1056   AST 21 01/27/2021 0847   AST 21 07/14/2017 1245   ALT 10 04/27/2021 1056   ALT 14 01/27/2021 0847   ALT 21 07/14/2017 1245   ALKPHOS 64 04/27/2021 1056   ALKPHOS 78 07/14/2017 1245   BILITOT 0.6  04/27/2021 1056   BILITOT 0.6 01/27/2021 0847   BILITOT 0.43 07/14/2017 1245   GFRNONAA 56 (L) 04/27/2021 1056   GFRNONAA 60 (L) 01/27/2021 0847   GFRAA 57 (L) 04/21/2020 0817   GFRAA >60 01/14/2020 0828    No results found for: SPEP, UPEP  Lab Results  Component Value Date   WBC 4.2 04/27/2021   NEUTROABS 1.4 (L) 04/27/2021   HGB 11.4 (L) 04/27/2021   HCT 34.1 (L) 04/27/2021   MCV 82.4 04/27/2021   PLT 396 04/27/2021      Chemistry      Component Value Date/Time   NA 137 04/27/2021 1056   NA 143 07/14/2017 1245   K 3.8 04/27/2021 1056   K 3.3 (L) 07/14/2017 1245   CL 101 04/27/2021 1056   CO2 27 04/27/2021 1056   CO2 20 (L) 07/14/2017 1245   BUN 14 04/27/2021 1056   BUN 9.3 07/14/2017 1245   CREATININE 1.07 (H) 04/27/2021 1056   CREATININE 1.01 (H) 01/27/2021 0847   CREATININE 0.8 07/14/2017 1245      Component Value Date/Time   CALCIUM 9.7 04/27/2021 1056   CALCIUM 8.4 07/14/2017 1245   ALKPHOS 64 04/27/2021 1056   ALKPHOS 78 07/14/2017 1245   AST 15 04/27/2021 1056   AST 21 01/27/2021 0847   AST 21 07/14/2017 1245   ALT 10 04/27/2021 1056   ALT 14 01/27/2021 0847   ALT 21 07/14/2017 1245   BILITOT 0.6 04/27/2021 1056   BILITOT 0.6 01/27/2021 0847   BILITOT 0.43 07/14/2017 1245

## 2021-04-27 NOTE — Assessment & Plan Note (Signed)
The patient declined further physical therapy due to recent unpleasant experience I have completed application for her to get disability parking permit

## 2021-04-27 NOTE — Patient Instructions (Signed)
[facility]  Discharge Instructions: Thank you for choosing [facility] to provide your oncology and hematology care.   If you have a lab appointment with the [facility], please go directly to the [facility] and check in at the registration area.   Wear comfortable clothing and clothing appropriate for easy access to any Portacath or PICC line.   We strive to give you quality time with your provider. You may need to reschedule your appointment if you arrive late (15 or more minutes).  Arriving late affects you and other patients whose appointments are after yours.  Also, if you miss three or more appointments without notifying the office, you may be dismissed from the clinic at the provider's discretion.      For prescription refill requests, have your pharmacy contact our office and allow 72 hours for refills to be completed.    Today you received the following chemotherapy and/or immunotherapy agents Daratumumab-hyaluronidase-fihj SubQ Injection      To help prevent nausea and vomiting after your treatment, we encourage you to take your nausea medication as directed.  BELOW ARE SYMPTOMS THAT SHOULD BE REPORTED IMMEDIATELY: *FEVER GREATER THAN 100.4 F (38 C) OR HIGHER *CHILLS OR SWEATING *NAUSEA AND VOMITING THAT IS NOT CONTROLLED WITH YOUR NAUSEA MEDICATION *UNUSUAL SHORTNESS OF BREATH *UNUSUAL BRUISING OR BLEEDING *URINARY PROBLEMS (pain or burning when urinating, or frequent urination) *BOWEL PROBLEMS (unusual diarrhea, constipation, pain near the anus) TENDERNESS IN MOUTH AND THROAT WITH OR WITHOUT PRESENCE OF ULCERS (sore throat, sores in mouth, or a toothache) UNUSUAL RASH, SWELLING OR PAIN  UNUSUAL VAGINAL DISCHARGE OR ITCHING   Items with * indicate a potential emergency and should be followed up as soon as possible or go to the Emergency Department if any problems should occur.  Please show the CHEMOTHERAPY ALERT CARD or  IMMUNOTHERAPY ALERT CARD at check-in to the Emergency Department and triage nurse.  Should you have questions after your visit or need to cancel or reschedule your appointment, please contact [facility]  Dept: [phone]  and follow the prompts.  Office hours are 8:00 a.m. to 4:30 p.m. Monday - Friday. Please note that voicemails left after 4:00 p.m. may not be returned until the following business day.  We are closed weekends and major holidays. You have access to a nurse at all times for urgent questions. Please call the main number to the clinic Dept: [phone] and follow the prompts.   For any non-urgent questions, you may also contact your provider using MyChart. We now offer e-Visits for anyone 66 and older to request care online for non-urgent symptoms. For details visit [email or web address].   Also download the MyChart app! Go to the app store, search "MyChart", open the app, select [facility], and log in with your MyChart username and password.  Due to Covid, a mask is required upon entering the hospital/clinic. If you do not have a mask, one will be given to you upon arrival. For doctor visits, patients may have 1 support person aged 30 or older with them. For treatment visits, patients cannot have anyone with them due to current Covid guidelines and our immunocompromised population.

## 2021-04-27 NOTE — Assessment & Plan Note (Signed)
I have reviewed her recent myeloma panel She has excellent response to therapy We will proceed with monthly daratumumab and Pomalyst as scheduled She had received recent Zometa She will continue calcium with vitamin D She will continue aspirin for DVT prophylaxis She will continue acyclovir for antimicrobial prophylaxis

## 2021-04-27 NOTE — Assessment & Plan Note (Signed)
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

## 2021-04-27 NOTE — Assessment & Plan Note (Signed)
After several doses of B12 injection, her B12 level was adequate She will now get B12 every 3 months

## 2021-04-28 ENCOUNTER — Ambulatory Visit: Payer: Medicare Other | Admitting: Physical Therapy

## 2021-04-28 ENCOUNTER — Encounter: Payer: Self-pay | Admitting: Physical Therapy

## 2021-04-28 LAB — KAPPA/LAMBDA LIGHT CHAINS
Kappa free light chain: 24.5 mg/L — ABNORMAL HIGH (ref 3.3–19.4)
Kappa, lambda light chain ratio: 5.83 — ABNORMAL HIGH (ref 0.26–1.65)
Lambda free light chains: 4.2 mg/L — ABNORMAL LOW (ref 5.7–26.3)

## 2021-04-28 NOTE — Therapy (Signed)
[facility] [address] [place], [place], [address] Phone: [phone]   Fax:  [phone]  Patient Details  Name: [name] MRN: [id number] Date of Birth: 1950-09-23 Referring Provider:  No ref. 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  Pt does not feel she is able to continue with therapy at this time and wishes to be evaluated for other causes (cardiac, etc.) and then may return to therapy if dizziness continues and no other cause is identified.     Remaining deficits: Dizziness, imbalance, increased falls risk   Education / Equipment: HEP   Patient agrees to discharge. Patient goals were not met. Patient is being discharged due to the patient's request.  Rico Junker, PT, DPT 04/28/21    8:33 AM   [facility] [address] [place], [place], [address] Phone: [phone]   Fax:  [phone]

## 2021-04-29 ENCOUNTER — Ambulatory Visit (HOSPITAL_COMMUNITY): Payer: Medicare Other | Attending: Cardiology

## 2021-04-29 ENCOUNTER — Other Ambulatory Visit: Payer: Self-pay

## 2021-04-29 DIAGNOSIS — R55 Syncope and collapse: Secondary | ICD-10-CM | POA: Diagnosis not present

## 2021-04-29 DIAGNOSIS — I341 Nonrheumatic mitral (valve) prolapse: Secondary | ICD-10-CM | POA: Insufficient documentation

## 2021-04-29 LAB — ECHOCARDIOGRAM COMPLETE: Area-P 1/2: 3.16 cm2

## 2021-05-01 ENCOUNTER — Telehealth: Payer: Self-pay

## 2021-05-01 LAB — MULTIPLE MYELOMA PANEL, SERUM
Albumin SerPl Elph-Mcnc: 3.6 g/dL (ref 2.9–4.4)
Albumin/Glob SerPl: 1.3 (ref 0.7–1.7)
Alpha 1: 0.3 g/dL (ref 0.0–0.4)
Alpha2 Glob SerPl Elph-Mcnc: 0.9 g/dL (ref 0.4–1.0)
B-Globulin SerPl Elph-Mcnc: 1.1 g/dL (ref 0.7–1.3)
Gamma Glob SerPl Elph-Mcnc: 0.6 g/dL (ref 0.4–1.8)
Globulin, Total: 2.9 g/dL (ref 2.2–3.9)
IgA: 16 mg/dL — ABNORMAL LOW (ref 64–422)
IgG (Immunoglobin G), Serum: 653 mg/dL (ref 586–1602)
IgM (Immunoglobulin M), Srm: 9 mg/dL — ABNORMAL LOW (ref 26–217)
M Protein SerPl Elph-Mcnc: 0.4 g/dL — ABNORMAL HIGH
Total Protein ELP: 6.5 g/dL (ref 6.0–8.5)

## 2021-05-01 NOTE — Telephone Encounter (Signed)
-----   Message from [name], MD sent at 05/01/2021  1:27 PM EDT ----- Pls call and let her know myeloma panel is good

## 2021-05-01 NOTE — Telephone Encounter (Signed)
Pt notified, no further needs.  

## 2021-05-04 ENCOUNTER — Telehealth: Payer: Self-pay

## 2021-05-04 NOTE — Telephone Encounter (Signed)
Returned her call. She is having voice hoarseness, nasal stuffiness, throat discomfort, denies fever and non-productive cough. She is taking her normal Claritin as usual.  Instructed to take a home covid test and call the office back with results. She verbalized understanding.

## 2021-05-04 NOTE — Telephone Encounter (Signed)
Most like a viral cause, no need antibiotics Continue claritin and decongestant

## 2021-05-04 NOTE — Telephone Encounter (Signed)
She called back. Covid test negative.  She is asking if you have any suggestions? If she needs antibiotic?

## 2021-05-04 NOTE — Telephone Encounter (Signed)
Called and given below message. She verbalized understanding. 

## 2021-05-05 ENCOUNTER — Other Ambulatory Visit: Payer: Self-pay

## 2021-05-05 DIAGNOSIS — C9002 Multiple myeloma in relapse: Secondary | ICD-10-CM

## 2021-05-05 MED ORDER — POMALIDOMIDE 2 MG PO CAPS
ORAL_CAPSULE | ORAL | 0 refills | Status: DC
Start: 2021-05-05 — End: 2021-05-26

## 2021-05-07 ENCOUNTER — Encounter: Payer: Medicare Other | Admitting: Physical Therapy

## 2021-05-11 ENCOUNTER — Emergency Department (HOSPITAL_BASED_OUTPATIENT_CLINIC_OR_DEPARTMENT_OTHER)
Admission: EM | Admit: 2021-05-11 | Discharge: 2021-05-11 | Disposition: A | Discharge status: Home / Self Care | Payer: Medicare Other | Attending: Emergency Medicine | Admitting: Emergency Medicine

## 2021-05-11 ENCOUNTER — Other Ambulatory Visit: Payer: Self-pay

## 2021-05-11 ENCOUNTER — Emergency Department (HOSPITAL_BASED_OUTPATIENT_CLINIC_OR_DEPARTMENT_OTHER): Payer: Medicare Other | Admitting: Radiology

## 2021-05-11 ENCOUNTER — Encounter (HOSPITAL_BASED_OUTPATIENT_CLINIC_OR_DEPARTMENT_OTHER): Payer: Self-pay | Admitting: *Deleted

## 2021-05-11 ENCOUNTER — Emergency Department (HOSPITAL_BASED_OUTPATIENT_CLINIC_OR_DEPARTMENT_OTHER): Payer: Medicare Other

## 2021-05-11 ENCOUNTER — Telehealth: Payer: Self-pay

## 2021-05-11 DIAGNOSIS — N183 Chronic kidney disease, stage 3 unspecified: Secondary | ICD-10-CM | POA: Insufficient documentation

## 2021-05-11 DIAGNOSIS — R42 Dizziness and giddiness: Secondary | ICD-10-CM

## 2021-05-11 DIAGNOSIS — M533 Sacrococcygeal disorders, not elsewhere classified: Secondary | ICD-10-CM | POA: Insufficient documentation

## 2021-05-11 DIAGNOSIS — R55 Syncope and collapse: Secondary | ICD-10-CM | POA: Diagnosis not present

## 2021-05-11 DIAGNOSIS — Z7982 Long term (current) use of aspirin: Secondary | ICD-10-CM | POA: Diagnosis not present

## 2021-05-11 DIAGNOSIS — Z79899 Other long term (current) drug therapy: Secondary | ICD-10-CM | POA: Diagnosis not present

## 2021-05-11 DIAGNOSIS — I129 Hypertensive chronic kidney disease with stage 1 through stage 4 chronic kidney disease, or unspecified chronic kidney disease: Secondary | ICD-10-CM | POA: Diagnosis not present

## 2021-05-11 LAB — BASIC METABOLIC PANEL
Anion gap: 10 (ref 5–15)
BUN: 14 mg/dL (ref 8–23)
CO2: 27 mmol/L (ref 22–32)
Calcium: 9.7 mg/dL (ref 8.9–10.3)
Chloride: 102 mmol/L (ref 98–111)
Creatinine, Ser: 0.95 mg/dL (ref 0.44–1.00)
GFR, Estimated: >60 mL/min (ref >60)
Glucose, Bld: 91 mg/dL (ref 70–99)
Potassium: 4.2 mmol/L (ref 3.5–5.1)
Sodium: 139 mmol/L (ref 135–145)

## 2021-05-11 LAB — CBC
HCT: 34.5 % — ABNORMAL LOW (ref 36.0–46.0)
Hemoglobin: 11.2 g/dL — ABNORMAL LOW (ref 12.0–15.0)
MCH: 27.5 pg (ref 26.0–34.0)
MCHC: 32.5 g/dL (ref 30.0–36.0)
MCV: 84.6 fL (ref 80.0–100.0)
Platelets: 313 10*3/uL (ref 150–400)
RBC: 4.08 MIL/uL (ref 3.87–5.11)
RDW: 16.9 % — ABNORMAL HIGH (ref 11.5–15.5)
WBC: 4.8 10*3/uL (ref 4.0–10.5)
nRBC: 0 % (ref 0.0–0.2)

## 2021-05-11 MED ORDER — SODIUM CHLORIDE 0.9 % IV BOLUS: 500.0000 mL | Freq: Once | INTRAVENOUS | Status: DC

## 2021-05-11 MED ORDER — OXYCODONE HCL 5 MG PO TABS
5.0000 mg | ORAL_TABLET | Freq: Once | ORAL | Status: AC
  Administered 2021-05-11: 5 mg via ORAL
  Filled 2021-05-11: qty 1

## 2021-05-11 MED ORDER — ONDANSETRON 4 MG PO TBDP
4.0000 mg | ORAL_TABLET | Freq: Once | ORAL | Status: AC
Start: 2021-05-11 — End: 2021-05-11
  Administered 2021-05-11: 4 mg via ORAL
  Filled 2021-05-11: qty 1

## 2021-05-11 MED ORDER — MECLIZINE HCL 12.5 MG PO TABS: 12.5000 mg | ORAL_TABLET | Freq: Three times a day (TID) | ORAL | 0 refills | Status: DC | PRN

## 2021-05-11 MED ORDER — LIDOCAINE 5 % EX PTCH
1.0000 | MEDICATED_PATCH | CUTANEOUS | Status: DC
  Administered 2021-05-11: 1 via TRANSDERMAL
  Filled 2021-05-11: qty 1

## 2021-05-11 NOTE — ED Triage Notes (Addendum)
Pt had 2 syncopal episodes on Friday, Dizziness and nausea episode then passed out. Pt seen at [facility] after 1st event about a month ago. Pt is having pain in her "tailbone" Generalized soreness.

## 2021-05-11 NOTE — Discharge Instructions (Signed)
Take Meclizine as needed as prescribed for dizziness. Try epley maneuver at home after taking medication. Follow up with neurology, call to schedule an appointment.

## 2021-05-11 NOTE — ED Provider Notes (Signed)
[facility] EMERGENCY DEPT Provider Note   CSN: [id number] Arrival date & time: 05/11/21  1200     History Chief Complaint  Patient presents with   Fall   Near Syncope    Jessica [name] is a 70 y.o. female.  70 year old female with history of multiple myeloma (currently on oral chemo), MVP, additional history as listed below presents emergency room with complaint of syncope which occurred on Friday (3 days ago).  Patient states that she woke up that morning feeling her usual self, sat on the edge of the bed for a period of time to make sure that she was not too dizzy to get out of bed, was ambulatory to the bathroom without assistance.  Patient finished using the bathroom and when she stood up to walk to the sink she began to feel dizzy (described as room spinning and lightheaded) and then passed out and hit the floor.  Patient's husband heard her fall, came to find her lying on the bathroom floor, was communicative and he assisted her to a standing position which point he said he witnessed her eyes rolling back in her head and she went limp and he assisted her to the ground.  Both episodes were brief.  Patient stayed in bed all weekend feeling generally sore.  States that she had a similar syncopal event about 4 to 6 weeks ago in which she fell and injured her tailbone and had a laceration to her scalp.  Patient was seen in the emergency room after that fall and followed up with physical therapy.  Patient states that physical therapy they laid her on her back and turned her head several different ways that caused her to feel very dizzy and unwell and she did not return.  Patient also followed up with cardiology who evaluated her with an echo and told her she had mild mitral valve prolapse however did not feel that she had a heart condition contributing to her syncopal event.  No other injuries, complaints, concerns today.  Patient is not anticoagulated.      Past Medical History:   Diagnosis Date   H/O stem cell transplant [facility]) 03/2015   [facility]   Multiple myeloma [facility]) 11/19/2014   Multiple myeloma ([facility])    Multiple myeloma (HCC)    MVP (mitral valve prolapse)    req prophylaxis   Other constipation 12/03/2014   Right ovarian cyst 10/16/15   16 mm simple cyst - ultrasound yearly.   Upper respiratory infection, acute 08/12/2015    Patient Active Problem List   Diagnosis Date Noted   Drug-induced skin rash 03/11/2021   Fall against sharp object, initial encounter 03/09/2021   Deficiency anemia 02/24/2021   Vitamin B12 deficiency 01/27/2021   Nausea with vomiting 12/31/2020   Physical debility 10/06/2020   Irritation of right eye 09/08/2020   Immunocompromised state due to drug therapy ([facility]) 05/19/2020   Preventive measure 04/21/2020   Alopecia 08/21/2019   Hypomagnesemia 04/25/2019   Right sided temporal headache 04/13/2019   Goals of care, counseling/discussion 04/13/2019   Hypocalcemia 01/11/2019   Osteopenia 06/05/2018   Weight loss 04/14/2018   Elevated blood pressure reading in office without diagnosis of hypertension 01/12/2018   Diarrhea 10/13/2017   Postural dizziness 10/13/2017   Right ovarian cyst 05/13/2017   Chronic GERD 01/13/2017   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 08/10/2016   Hypokalemia 07/16/2016   Pneumonia 07/13/2016   Atypical chest pain 07/13/2016   Chest pain on breathing 07/12/2016  Mucositis due to antineoplastic therapy 07/09/2016   Candidal esophagitis ([facility]) 04/30/2016   Herpes simplex 04/30/2016   Chronic kidney disease, stage III (moderate) (HCC) 03/12/2016   Hypokalemia, gastrointestinal losses 01/16/2016   Pancytopenia, acquired ([facility]) 10/20/2015   Nasal congestion 10/20/2015   Varicose vein of leg 08/25/2015   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/22/2015   Hypotension due to drugs 04/22/2015   Petechiae 02/14/2015   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/07/2015   Essential hypertension 02/07/2015    Infection of eyelid 01/16/2015   Protein calorie malnutrition ([facility]) 01/16/2015   Superficial thrombophlebitis of upper extremity 12/24/2014   Sty, external 12/24/2014   Other constipation 12/03/2014   Dehydration 12/02/2014   Weakness 12/02/2014   Multiple myeloma in relapse ([facility]) 11/19/2014   Prerenal renal failure 11/19/2014   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[facility]) 11/19/2014   Neuropathic pain of left flank 11/19/2014    Past Surgical History:  Procedure Laterality Date   CATARACT EXTRACTION, BILATERAL  10/2010   Implants(ReSTOR) Bilat   LASER ABLATION OF CONDYLOMAS  1990   CIN 1 cervix   multiple myeloma surgery Right 08/2014   [facility]     OB History     Gravida  1   Para  1   Term      Preterm      AB      Living  1      SAB      IAB      Ectopic      Multiple      Live Births              Family History  Problem Relation Age of Onset   Osteoporosis Mother    Diabetes Father     Social History   Tobacco Use   Smoking status: Never   Smokeless tobacco: Never  Vaping Use   Vaping Use: Never used  Substance Use Topics   Alcohol use: No    Alcohol/week: 0.0 standard drinks   Drug use: No    Home Medications Prior to Admission medications   Medication Sig Start Date End Date Taking? Authorizing Provider  meclizine (ANTIVERT) 12.5 MG tablet Take 1 tablet (12.5 mg total) by mouth 3 (three) times daily as needed for dizziness. Take 1-2 tablets 05/11/21  Yes Tacy Learn, PA-C  acetaminophen (TYLENOL) 500 MG tablet Take 500-1,000 mg by mouth every 6 (six) hours as needed for mild pain, moderate pain, fever or headache.    [provider]  acyclovir (ZOVIRAX) 400 MG tablet TAKE 1 TABLET BY MOUTH TWICE DAILY 08/12/20   [name], MD  aspirin EC 81 MG tablet Take 81 mg by mouth daily with breakfast.    [provider]  calcium carbonate (OSCAL) 1500 (600 Ca) MG TABS tablet Take 1,500 mg by  mouth daily with breakfast.    [provider]  Calcium Carbonate 500 MG CHEW Chew 500 mg by mouth as needed for indigestion.    [provider]  Cholecalciferol (VITAMIN D3) 2000 units capsule Take 2,000 Units by mouth daily.     [provider]  fluorometholone (FML) 0.1 % ophthalmic suspension Place 1 drop into both eyes 3 (three) times daily. Patient not taking: Reported on 04/14/2021 09/16/20   [provider]  loratadine (CLARITIN) 10 MG tablet Take 10 mg by mouth daily.    [provider]  Multiple Vitamins-Minerals (CENTRUM SILVER PO) Take 1 tablet by mouth daily.  [provider]  omeprazole (PRILOSEC) 20 MG capsule TAKE 1 CAPSULE(20 MG) BY MOUTH DAILY 02/10/21   Alvy Bimler, Ni, MD  ondansetron (ZOFRAN) 8 MG tablet TAKE 1 TABLET(8 MG) BY MOUTH EVERY 8 HOURS AS NEEDED FOR NAUSEA OR VOMITING 02/23/21   [name], MD  pomalidomide (POMALYST) 2 MG capsule Take 1 pill daily for 14 days then rest 7 days for cycle of every 21 days 05/05/21   [name], MD  senna-docusate (SENOKOT-S) 8.6-50 MG tablet Take 1-2 tablets by mouth daily as needed for mild constipation or moderate constipation.  08/20/14   [provider]  sodium chloride (OCEAN) 0.65 % SOLN nasal spray Place 1 spray into both nostrils as needed for congestion.    [provider]  triamcinolone ointment (KENALOG) 0.1 % Apply 1 application topically daily. 09/16/20   [provider]  triamterene-hydrochlorothiazide (MAXZIDE-25) 37.5-25 MG tablet Take 0.5 tablets by mouth daily. 12/04/19   [provider]  vitamin B-12 (CYANOCOBALAMIN) 500 MCG tablet Take 500 mcg by mouth daily.    [provider]    Allergies    Augmentin [amoxicillin-pot clavulanate], Albuterol, Codeine, Ibuprofen, Nsaids, Tolmetin, and Tetanus-diphth-acell pertussis  Review of Systems   Review of Systems  Constitutional:  Negative for chills, diaphoresis and fever.  Eyes:   Negative for visual disturbance.  Respiratory:  Negative for shortness of breath.   Cardiovascular:  Negative for chest pain.  Gastrointestinal:  Negative for abdominal pain, constipation, diarrhea, nausea and vomiting.  Musculoskeletal:  Positive for arthralgias and myalgias. Negative for back pain, gait problem, neck pain and neck stiffness.  Skin:  Negative for rash and wound.  Allergic/Immunologic: Positive for immunocompromised state.  Neurological:  Positive for dizziness and light-headedness. Negative for weakness and headaches.  Hematological:  Does not bruise/bleed easily.  Psychiatric/Behavioral:  Negative for confusion.   All other systems reviewed and are negative.  Physical Exam Updated Vital Signs BP 127/76   Pulse 66   Temp 98.1 F (36.7 C)   Resp 16   Ht 5' 0.75" (1.543 m)   Wt 49.9 kg   LMP 08/10/2003   SpO2 100%   BMI 20.96 kg/m   Physical Exam Vitals and nursing note reviewed.  Constitutional:      General: She is not in acute distress.    Appearance: She is well-developed. She is not diaphoretic.  HENT:     Head: Normocephalic and atraumatic.     Mouth/Throat:     Mouth: Mucous membranes are moist.  Eyes:     Extraocular Movements: Extraocular movements intact.     Pupils: Pupils are equal, round, and reactive to light.  Cardiovascular:     Rate and Rhythm: Normal rate and regular rhythm.     Heart sounds: Normal heart sounds.  Pulmonary:     Effort: Pulmonary effort is normal.     Breath sounds: Normal breath sounds.  Abdominal:     Palpations: Abdomen is soft.     Tenderness: There is no abdominal tenderness.  Musculoskeletal:        General: No swelling, tenderness, deformity or signs of injury.     Cervical back: Normal range of motion and neck supple. No tenderness or bony tenderness.     Thoracic back: No tenderness or bony tenderness.     Lumbar back: No tenderness or bony tenderness.     Right lower leg: No edema.     Left lower leg: No  edema.  Skin:    General:  Skin is warm and dry.     Findings: No erythema or rash.  Neurological:     Mental Status: She is alert and oriented to person, place, and time.     Cranial Nerves: No cranial nerve deficit.     Sensory: No sensory deficit.     Motor: No weakness.  Psychiatric:        Behavior: Behavior normal.    ED Results / Procedures / Treatments   Labs (all labs ordered are listed, but only abnormal results are displayed) Labs Reviewed  CBC - Abnormal; Notable for the following components:      Result Value   Hemoglobin 11.2 (*)    HCT 34.5 (*)    RDW 16.9 (*)    All other components within normal limits  BASIC METABOLIC PANEL  URINALYSIS, ROUTINE W REFLEX MICROSCOPIC    EKG EKG Interpretation  Date/Time:  Monday May 11 2021 12:11:30 EDT Ventricular Rate:  94 PR Interval:  136 QRS Duration: 78 QT Interval:  552 QTC Calculation: 690 R Axis:   -4 Text Interpretation: Nonspecific T wave abnormality Prolonged QT Abnormal ECG Repeat ekg due to prolonged QT read Confirmed by Varney Biles [phone]) on 05/11/2021 2:18:45 PM  Radiology DG Sacrum/Coccyx  Result Date: 05/11/2021 CLINICAL DATA:  Syncopal episodes 4 days ago.  Fall.  Tailbone pain. EXAM: SACRUM AND COCCYX - 3 VIEW COMPARISON:  None. FINDINGS: No fracture or bone lesion. SI joints normally spaced and aligned. Soft tissues are unremarkable. IMPRESSION: Negative. Electronically Signed   By: Lajean Manes M.D.   On: 05/11/2021 13:33   CT Head Wo Contrast  Result Date: 05/11/2021 CLINICAL DATA:  Syncope and fall. Patient had 2 syncopal episodes 4 days ago. Dizziness and nausea prior to the syncopal episode. Generalized soreness. History of multiple myeloma. EXAM: CT HEAD WITHOUT CONTRAST TECHNIQUE: Contiguous axial images were obtained from the base of the skull through the vertex without intravenous contrast. COMPARISON:  03/09/2021 FINDINGS: Brain: No evidence of acute infarction, hemorrhage,  hydrocephalus, extra-axial collection or mass lesion/mass effect. Periventricular white matter hypoattenuation is noted consistent with mild chronic microvascular ischemic change. Vascular: No hyperdense vessel or unexpected calcification. Skull: Previous right frontotemporal craniotomy. No acute fracture. No skull lesion. Sinuses/Orbits: Visualized globes and orbits are unremarkable. Visualized sinuses are clear. Other: None. IMPRESSION: 1. No acute intracranial abnormalities. 2. Stable changes from a previous right frontotemporal craniotomy. Stable changes of mild chronic microvascular ischemic change. Electronically Signed   By: Lajean Manes M.D.   On: 05/11/2021 13:33    Procedures Procedures   Medications Ordered in ED Medications  sodium chloride 0.9 % bolus 500 mL (has no administration in time range)  lidocaine (LIDODERM) 5 % 1 patch (1 patch Transdermal Patch Applied 05/11/21 1338)  oxyCODONE (Oxy IR/ROXICODONE) immediate release tablet 5 mg (5 mg Oral Given 05/11/21 1332)  ondansetron (ZOFRAN-ODT) disintegrating tablet 4 mg (4 mg Oral Given 05/11/21 1333)    ED Course  I have reviewed the triage vital signs and the nursing notes.  Pertinent labs & imaging results that were available during my care of the patient were reviewed by me and considered in my medical decision making (see chart for details).  Clinical Course as of 05/11/21 1629  Mon May 11, 5912  [id number] 70 year old female with complaint of dizziness and fall today.  On exam, has tenderness to the sacrum otherwise exam unremarkable.  CT head without acute injury, x-ray sacrum does not show bony injury.  CBC without significant  changes from prior, mild anemia with hemoglobin of 11.2, previously 11.4.  BMP unremarkable, normal renal function.  Initial EKG with QT prolongation, repeated and improved at 456.  Case discussed with Dr. Kathrynn Humble, ER attending who has seen the patient.  Recommends meclizine, home instruction for Epley  maneuver and referral to neurology. [LM]    Clinical Course User Index [LM] Roque Lias   MDM Rules/Calculators/A&P                           Final Clinical Impression(s) / ED Diagnoses Final diagnoses:  Dizziness  Syncope, unspecified syncope type  Pain in sacrum    Rx / DC Orders ED Discharge Orders          Ordered    meclizine (ANTIVERT) 12.5 MG tablet  3 times daily PRN        05/11/21 1532             Tacy Learn, PA-C 05/11/21 Rose Bud, Ankit, MD 05/12/21 [phone]

## 2021-05-11 NOTE — Telephone Encounter (Signed)
Returned her call. She passed out x 2 early Friday am. She had dizziness and nausea prior to the episode and while her husband was helping her up. She passed out again. Complaining of a lot of pain in buttocks. She is taking tylenol for pain and stayed in bed over the weekend. She is slowly moving around with the assistance of her husband.  Instructed to go to ER to be evaluated today. She verbalized understanding and will go today.

## 2021-05-12 ENCOUNTER — Telehealth: Payer: Self-pay

## 2021-05-12 ENCOUNTER — Encounter: Payer: Medicare Other | Admitting: Physical Therapy

## 2021-05-12 NOTE — Telephone Encounter (Signed)
Returned her call. She call to give update after going to ER. She is taking tylenol prn for pain to buttocks. She will start Meclizine prn for dizziness.  She just wanted to make sure Dr. Alvy Bimler was aware.

## 2021-05-18 ENCOUNTER — Telehealth: Payer: Self-pay

## 2021-05-18 NOTE — Telephone Encounter (Signed)
The concerns I have is her chronic dizziness and falls I suggest round the clock acetaminophen 1000 mg 4 times daily until I see her next She can take 400 mg ibuprofen as needed in between

## 2021-05-18 NOTE — Telephone Encounter (Signed)
Returned her call. She is having a lot of pain in her tail bone. She is using a donut to sit on when she sits up. She is mostly laying in the bed on her side. Denies bruises/ open skin to buttocks. She is taking the Meclizine and dizziness is some better.   She is asking if you have any suggestions?  She uses [facility].

## 2021-05-18 NOTE — Telephone Encounter (Signed)
Called and given below message. She verbalized understanding. 

## 2021-05-19 ENCOUNTER — Encounter: Payer: Medicare Other | Admitting: Physical Therapy

## 2021-05-25 ENCOUNTER — Inpatient Hospital Stay: Payer: Medicare Other

## 2021-05-25 ENCOUNTER — Inpatient Hospital Stay: Payer: Medicare Other | Attending: Hematology and Oncology

## 2021-05-25 ENCOUNTER — Other Ambulatory Visit: Payer: Self-pay

## 2021-05-25 ENCOUNTER — Encounter: Payer: Self-pay | Admitting: Hematology and Oncology

## 2021-05-25 ENCOUNTER — Inpatient Hospital Stay (HOSPITAL_BASED_OUTPATIENT_CLINIC_OR_DEPARTMENT_OTHER): Payer: Medicare Other | Admitting: Hematology and Oncology

## 2021-05-25 VITALS — BP 121/76 | HR 82 | Temp 99.0°F | Resp 18 | Ht 60.75 in | Wt 112.2 lb

## 2021-05-25 DIAGNOSIS — R296 Repeated falls: Secondary | ICD-10-CM | POA: Insufficient documentation

## 2021-05-25 DIAGNOSIS — Z9481 Bone marrow transplant status: Secondary | ICD-10-CM

## 2021-05-25 DIAGNOSIS — Z5112 Encounter for antineoplastic immunotherapy: Secondary | ICD-10-CM | POA: Insufficient documentation

## 2021-05-25 DIAGNOSIS — Z885 Allergy status to narcotic agent status: Secondary | ICD-10-CM | POA: Diagnosis not present

## 2021-05-25 DIAGNOSIS — C9002 Multiple myeloma in relapse: Secondary | ICD-10-CM | POA: Insufficient documentation

## 2021-05-25 DIAGNOSIS — M545 Low back pain, unspecified: Secondary | ICD-10-CM

## 2021-05-25 DIAGNOSIS — Z7961 Long term (current) use of immunomodulator: Secondary | ICD-10-CM | POA: Insufficient documentation

## 2021-05-25 DIAGNOSIS — Z880 Allergy status to penicillin: Secondary | ICD-10-CM | POA: Diagnosis not present

## 2021-05-25 DIAGNOSIS — R5383 Other fatigue: Secondary | ICD-10-CM | POA: Insufficient documentation

## 2021-05-25 DIAGNOSIS — I082 Rheumatic disorders of both aortic and tricuspid valves: Secondary | ICD-10-CM | POA: Insufficient documentation

## 2021-05-25 DIAGNOSIS — R531 Weakness: Secondary | ICD-10-CM | POA: Insufficient documentation

## 2021-05-25 DIAGNOSIS — R42 Dizziness and giddiness: Secondary | ICD-10-CM | POA: Diagnosis not present

## 2021-05-25 DIAGNOSIS — Z79899 Other long term (current) drug therapy: Secondary | ICD-10-CM | POA: Diagnosis not present

## 2021-05-25 DIAGNOSIS — M549 Dorsalgia, unspecified: Secondary | ICD-10-CM | POA: Insufficient documentation

## 2021-05-25 DIAGNOSIS — Z881 Allergy status to other antibiotic agents status: Secondary | ICD-10-CM | POA: Insufficient documentation

## 2021-05-25 DIAGNOSIS — C9001 Multiple myeloma in remission: Secondary | ICD-10-CM

## 2021-05-25 DIAGNOSIS — Z887 Allergy status to serum and vaccine status: Secondary | ICD-10-CM | POA: Insufficient documentation

## 2021-05-25 DIAGNOSIS — R9082 White matter disease, unspecified: Secondary | ICD-10-CM | POA: Insufficient documentation

## 2021-05-25 DIAGNOSIS — I6782 Cerebral ischemia: Secondary | ICD-10-CM | POA: Insufficient documentation

## 2021-05-25 DIAGNOSIS — Z23 Encounter for immunization: Secondary | ICD-10-CM

## 2021-05-25 DIAGNOSIS — R110 Nausea: Secondary | ICD-10-CM | POA: Insufficient documentation

## 2021-05-25 DIAGNOSIS — Z9484 Stem cells transplant status: Secondary | ICD-10-CM | POA: Insufficient documentation

## 2021-05-25 DIAGNOSIS — Z7189 Other specified counseling: Secondary | ICD-10-CM

## 2021-05-25 DIAGNOSIS — R55 Syncope and collapse: Secondary | ICD-10-CM | POA: Diagnosis not present

## 2021-05-25 DIAGNOSIS — Z886 Allergy status to analgesic agent status: Secondary | ICD-10-CM | POA: Insufficient documentation

## 2021-05-25 LAB — CBC WITH DIFFERENTIAL/PLATELET
Abs Immature Granulocytes: 0.09 10*3/uL — ABNORMAL HIGH (ref 0.00–0.07)
Basophils Absolute: 0.2 10*3/uL — ABNORMAL HIGH (ref 0.0–0.1)
Basophils Relative: 3 %
Eosinophils Absolute: 0.5 10*3/uL (ref 0.0–0.5)
Eosinophils Relative: 9 %
HCT: 34.4 % — ABNORMAL LOW (ref 36.0–46.0)
Hemoglobin: 11.2 g/dL — ABNORMAL LOW (ref 12.0–15.0)
Immature Granulocytes: 2 %
Lymphocytes Relative: 33 %
Lymphs Abs: 1.8 10*3/uL (ref 0.7–4.0)
MCH: 27.5 pg (ref 26.0–34.0)
MCHC: 32.6 g/dL (ref 30.0–36.0)
MCV: 84.5 fL (ref 80.0–100.0)
Monocytes Absolute: 0.4 10*3/uL (ref 0.1–1.0)
Monocytes Relative: 7 %
Neutro Abs: 2.6 10*3/uL (ref 1.7–7.7)
Neutrophils Relative %: 46 %
Platelets: 242 10*3/uL (ref 150–400)
RBC: 4.07 MIL/uL (ref 3.87–5.11)
RDW: 16.7 % — ABNORMAL HIGH (ref 11.5–15.5)
WBC: 5.6 10*3/uL (ref 4.0–10.5)
nRBC: 0 % (ref 0.0–0.2)

## 2021-05-25 LAB — COMPREHENSIVE METABOLIC PANEL
ALT: 22 U/L (ref 0–44)
AST: 19 U/L (ref 15–41)
Albumin: 3.9 g/dL (ref 3.5–5.0)
Alkaline Phosphatase: 87 U/L (ref 38–126)
Anion gap: 11 (ref 5–15)
BUN: 15 mg/dL (ref 8–23)
CO2: 24 mmol/L (ref 22–32)
Calcium: 9.8 mg/dL (ref 8.9–10.3)
Chloride: 104 mmol/L (ref 98–111)
Creatinine, Ser: 1.1 mg/dL — ABNORMAL HIGH (ref 0.44–1.00)
GFR, Estimated: 54 mL/min — ABNORMAL LOW (ref >60)
Glucose, Bld: 86 mg/dL (ref 70–99)
Potassium: 3.9 mmol/L (ref 3.5–5.1)
Sodium: 139 mmol/L (ref 135–145)
Total Bilirubin: 0.8 mg/dL (ref 0.3–1.2)
Total Protein: 7 g/dL (ref 6.5–8.1)

## 2021-05-25 MED ORDER — DARATUMUMAB-HYALURONIDASE-FIHJ 1800-30000 MG-UT/15ML ~~LOC~~ SOLN
1800.0000 mg | Freq: Once | SUBCUTANEOUS | Status: AC
  Administered 2021-05-25: 1800 mg via SUBCUTANEOUS
  Filled 2021-05-25: qty 15

## 2021-05-25 MED ORDER — MONTELUKAST SODIUM 10 MG PO TABS
10.0000 mg | ORAL_TABLET | Freq: Once | ORAL | Status: AC
  Administered 2021-05-25: 10 mg via ORAL
  Filled 2021-05-25: qty 1

## 2021-05-25 MED ORDER — OXYCODONE HCL 5 MG PO TABS: 5.0000 mg | ORAL_TABLET | ORAL | 0 refills | Status: DC | PRN

## 2021-05-25 MED ORDER — ACETAMINOPHEN 325 MG PO TABS
650.0000 mg | ORAL_TABLET | Freq: Once | ORAL | Status: AC
  Administered 2021-05-25: 650 mg via ORAL
  Filled 2021-05-25: qty 2

## 2021-05-25 MED ORDER — DEXAMETHASONE 4 MG PO TABS
8.0000 mg | ORAL_TABLET | Freq: Once | ORAL | Status: AC
  Administered 2021-05-25: 8 mg via ORAL
  Filled 2021-05-25: qty 2

## 2021-05-25 MED ORDER — INFLUENZA VAC A&B SA ADJ QUAD 0.5 ML IM PRSY
0.5000 mL | PREFILLED_SYRINGE | Freq: Once | INTRAMUSCULAR | Status: AC
  Administered 2021-05-25: 0.5 mL via INTRAMUSCULAR
  Filled 2021-05-25: qty 0.5

## 2021-05-25 MED ORDER — OXYCODONE HCL 5 MG PO TABS
5.0000 mg | ORAL_TABLET | Freq: Once | ORAL | Status: AC
  Administered 2021-05-25: 5 mg via ORAL
  Filled 2021-05-25: qty 1

## 2021-05-25 NOTE — Progress Notes (Signed)
[place] OFFICE PROGRESS NOTE  Patient Care Team: Josetta Huddle, MD as PCP - General (Internal Medicine) Buford Dresser, MD as PCP - Cardiology (Cardiology) Ginette Pitman, MD as Consulting Physician (Hematology and Oncology) Hessie Dibble, MD as Consulting Physician (Hematology and Oncology)  ASSESSMENT & PLAN:  Multiple myeloma in relapse [facility]) I have reviewed her recent myeloma panel She has positive response to therapy and tolerated treatment well Her treatment is not the cause of her recurrent falls We will proceed with treatment without delay  Acute back pain She has severe lower back pain not alleviated by acetaminophen due to recent falls I will prescribe a small amount of oxycodone I warned her about side effects of oxycodone and discussed narcotic refill policy  Recurrent falls She has recent history of recurrent falls, multifactorial due to dizziness, changes in blood pressure and others I have referred her to see physical therapist and rehab but due to some recent unpleasant side effects, she declined going back For now, she appears motivated to go back once her pain is under control Will reassess at next visit I help her complete an application for a temporarily disability parking permit  No orders of the defined types were placed in this encounter.   All questions were answered. The patient knows to call the clinic with any problems, questions or concerns. The total time spent in the appointment was 30 minutes encounter with patients including review of chart and various tests results, discussions about plan of care and coordination of care plan   [name], MD 05/25/2021 10:20 AM  INTERVAL HISTORY: Please see below for problem oriented charting. she returns for treatment follow-up with her husband on treatment with Pomalyst and daratumumab for recurrent multiple myeloma She had another fall recently went to the emergency  department with x-ray imaging She continues to hurt badly in the sacrum area She was not prescribed pain medicine Acetaminophen is not helpful Denies new neurological deficits She continues to have occasional dizziness and weakness overall Because of her pain, she is not able to do much of anything From the treatment perspective, she denies recent infection  REVIEW OF SYSTEMS:   Constitutional: Denies fevers, chills or abnormal weight loss Eyes: Denies blurriness of vision Ears, nose, mouth, throat, and face: Denies mucositis or sore throat Respiratory: Denies cough, dyspnea or wheezes Cardiovascular: Denies palpitation, chest discomfort or lower extremity swelling Gastrointestinal:  Denies nausea, heartburn or change in bowel habits Skin: Denies abnormal skin rashes Lymphatics: Denies new lymphadenopathy or easy bruising Neurological:Denies numbness, tingling or new weaknesses Behavioral/Psych: Mood is stable, no new changes  All other systems were reviewed with the patient and are negative.  I have reviewed the past medical history, past surgical history, social history and family history with the patient and they are unchanged from previous note.  ALLERGIES:  is allergic to augmentin [amoxicillin-pot clavulanate], albuterol, codeine, ibuprofen, nsaids, tolmetin, and tetanus-diphth-acell pertussis.  MEDICATIONS:  Current Outpatient Medications  Medication Sig Dispense Refill   oxyCODONE (OXY IR/ROXICODONE) 5 MG immediate release tablet Take 1 tablet (5 mg total) by mouth every 4 (four) hours as needed for severe pain. 30 tablet 0   acetaminophen (TYLENOL) 500 MG tablet Take 500-1,000 mg by mouth every 6 (six) hours as needed for mild pain, moderate pain, fever or headache.     acyclovir (ZOVIRAX) 400 MG tablet TAKE 1 TABLET BY MOUTH TWICE DAILY 180 tablet 3   aspirin EC 81 MG tablet Take 81 mg  by mouth daily with breakfast.     calcium carbonate (OSCAL) 1500 (600 Ca) MG TABS tablet  Take 1,500 mg by mouth daily with breakfast.     Calcium Carbonate 500 MG CHEW Chew 500 mg by mouth as needed for indigestion.     Cholecalciferol (VITAMIN D3) 2000 units capsule Take 2,000 Units by mouth daily.      fluorometholone (FML) 0.1 % ophthalmic suspension Place 1 drop into both eyes 3 (three) times daily. (Patient not taking: Reported on 04/14/2021)     loratadine (CLARITIN) 10 MG tablet Take 10 mg by mouth daily.     meclizine (ANTIVERT) 12.5 MG tablet Take 1 tablet (12.5 mg total) by mouth 3 (three) times daily as needed for dizziness. Take 1-2 tablets 30 tablet 0   Multiple Vitamins-Minerals (CENTRUM SILVER PO) Take 1 tablet by mouth daily.      omeprazole (PRILOSEC) 20 MG capsule TAKE 1 CAPSULE(20 MG) BY MOUTH DAILY 90 capsule 11   ondansetron (ZOFRAN) 8 MG tablet TAKE 1 TABLET(8 MG) BY MOUTH EVERY 8 HOURS AS NEEDED FOR NAUSEA OR VOMITING 60 tablet 11   pomalidomide (POMALYST) 2 MG capsule Take 1 pill daily for 14 days then rest 7 days for cycle of every 21 days 14 capsule 0   senna-docusate (SENOKOT-S) 8.6-50 MG tablet Take 1-2 tablets by mouth daily as needed for mild constipation or moderate constipation.      sodium chloride (OCEAN) 0.65 % SOLN nasal spray Place 1 spray into both nostrils as needed for congestion.     triamcinolone ointment (KENALOG) 0.1 % Apply 1 application topically daily.     triamterene-hydrochlorothiazide (MAXZIDE-25) 37.5-25 MG tablet Take 0.5 tablets by mouth daily.     vitamin B-12 (CYANOCOBALAMIN) 500 MCG tablet Take 500 mcg by mouth daily.     No current facility-administered medications for this visit.   Facility-Administered Medications Ordered in Other Visits  Medication Dose Route Frequency Provider Last Rate Last Admin   0.9 %  sodium chloride infusion   Intravenous Once Alvy Bimler, Katharyn Schauer, MD       influenza vaccine adjuvanted (FLUAD) injection 0.5 mL  0.5 mL Intramuscular Once Alvy Bimler, Nanette Wirsing, MD        SUMMARY OF ONCOLOGIC HISTORY: Oncology History  Overview Note   M-protein 0.69 gm/dl IFIX - IgG, Kappa IgG - 868 IgA - 19 IgM - < 20 Kappa - 21 Lambda - 5.7  09/06/2014 - Bone marrow aspirate and biopsy:   Normocellular marrow for age (40%) with a small monoclonal plasma cell population (1% on aspirate). Karyotype 68, XX  FISH Negative for myeloma associated changes  09/12/2014 - PET/CT  Two regions that are concerning for disease, one adjacent/involving the left ninth rib and one in the marrow of the right femur, in this patient with history of plasmacytoma.    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   04/01/2015 Bone Marrow Transplant   She received melphalan chemotherapy on 03/31/2015 followed by autologous stem cell transplant the day after   04/03/2015 - 04/18/2015 Hospital Admission   The patient was admitted to the hospital at [facility] for management related to complication from stem cell transplant. She had significant nausea requiring intravenous anti-emetics.   07/17/2015 -   Chemotherapy   She started maintenance Revlimid and monthly zometa, then every 3 months   06/01/2018 Imaging   DEXA scan showed bone density T score in femur -2.3   04/20/2019 PET scan   1. No FDG avid osseous lesions or mass identified to suggest metabolically active lesion of myeloma or plasmacytoma. 2. Small nodular density within the paravertebral right lower lobe exhibits mild to moderate increased uptake within SUV max of 3.38. This is indeterminate. Review of CT chest from 10/05/2018 shows a corresponding Lung nodule in this area measuring the same. Small pulmonary neoplasm cannot be excluded. 3. Indeterminate, focal area of increased uptake is identified within the thoracic canal. Indeterminate favored to represent benign physiologic CNS activity.     04/30/2019 -  Chemotherapy   Patient is on Treatment Plan : MYELOMA SQ Daratumumab Faspro q28d     04/30/2019 - 07/09/2019 Chemotherapy   The patient had bortezomib SQ (VELCADE) chemo injection 1.75 mg, 1.3 mg/m2 = 1.75 mg, Subcutaneous,  Once, 10 of 11 cycles Administration: 1.75 mg (04/30/2019), 1.75 mg (05/07/2019), 1.75 mg (05/14/2019), 1.75 mg (05/21/2019), 1.75 mg (05/28/2019), 1.75 mg (06/04/2019), 1.75 mg (06/11/2019), 1.75 mg (06/18/2019), 1.75 mg (06/25/2019), 1.75 mg (07/09/2019)   for chemotherapy treatment.       PHYSICAL EXAMINATION: ECOG PERFORMANCE STATUS: 2 - Symptomatic, <50% confined to bed  Vitals:   05/25/21 0940  BP: 121/76  Pulse: 82  Resp: 18  Temp: 99 F (37.2 C)  SpO2: 100%   Filed Weights   05/25/21 0940  Weight: 112 lb 3.2 oz (50.9 kg)    GENERAL:alert, no distress and comfortable SKIN: skin color, texture, turgor are normal, no rashes or significant lesions EYES: normal, Conjunctiva are pink and non-injected, sclera clear OROPHARYNX:no exudate, no erythema and lips, buccal mucosa, and tongue normal  NECK: supple, thyroid normal size, non-tender, without nodularity LYMPH:  no palpable  lymphadenopathy in the cervical, axillary or inguinal LUNGS: clear to auscultation and percussion with normal breathing effort HEART: regular rate & rhythm and no murmurs and no lower extremity edema ABDOMEN:abdomen soft, non-tender and normal bowel sounds Musculoskeletal:no cyanosis of digits and no clubbing  NEURO: alert & oriented x 3 with fluent speech, noted gait imbalance  LABORATORY DATA:  I have reviewed the data as listed    Component Value Date/Time   NA 139 05/25/2021 0907   NA 143 07/14/2017 1245   K 3.9 05/25/2021 0907   K 3.3 (L) 07/14/2017 1245   CL 104 05/25/2021 0907   CO2 24 05/25/2021 0907   CO2 20 (L) 07/14/2017 1245   GLUCOSE 86 05/25/2021 0907   GLUCOSE 106 07/14/2017 1245   BUN 15 05/25/2021 0907   BUN 9.3 07/14/2017 1245  CREATININE 1.10 (H) 05/25/2021 0907   CREATININE 1.01 (H) 01/27/2021 0847   CREATININE 0.8 07/14/2017 1245   CALCIUM 9.8 05/25/2021 0907   CALCIUM 8.4 07/14/2017 1245   PROT 7.0 05/25/2021 0907   PROT 6.0 07/14/2017 1245   PROT 6.2 (L) 07/14/2017 1245   ALBUMIN 3.9 05/25/2021 0907   ALBUMIN 3.6 07/14/2017 1245   AST 19 05/25/2021 0907   AST 21 01/27/2021 0847   AST 21 07/14/2017 1245   ALT 22 05/25/2021 0907   ALT 14 01/27/2021 0847   ALT 21 07/14/2017 1245   ALKPHOS 87 05/25/2021 0907   ALKPHOS 78 07/14/2017 1245   BILITOT 0.8 05/25/2021 0907   BILITOT 0.6 01/27/2021 0847   BILITOT 0.43 07/14/2017 1245   GFRNONAA 54 (L) 05/25/2021 0907   GFRNONAA 60 (L) 01/27/2021 0847   GFRAA 57 (L) 04/21/2020 0817   GFRAA >60 01/14/2020 0828    No results found for: SPEP, UPEP  Lab Results  Component Value Date   WBC 5.6 05/25/2021   NEUTROABS 2.6 05/25/2021   HGB 11.2 (L) 05/25/2021   HCT 34.4 (L) 05/25/2021   MCV 84.5 05/25/2021   PLT 242 05/25/2021      Chemistry      Component Value Date/Time   NA 139 05/25/2021 0907   NA 143 07/14/2017 1245   K 3.9 05/25/2021 0907   K 3.3 (L) 07/14/2017 1245   CL 104 05/25/2021 0907    CO2 24 05/25/2021 0907   CO2 20 (L) 07/14/2017 1245   BUN 15 05/25/2021 0907   BUN 9.3 07/14/2017 1245   CREATININE 1.10 (H) 05/25/2021 0907   CREATININE 1.01 (H) 01/27/2021 0847   CREATININE 0.8 07/14/2017 1245      Component Value Date/Time   CALCIUM 9.8 05/25/2021 0907   CALCIUM 8.4 07/14/2017 1245   ALKPHOS 87 05/25/2021 0907   ALKPHOS 78 07/14/2017 1245   AST 19 05/25/2021 0907   AST 21 01/27/2021 0847   AST 21 07/14/2017 1245   ALT 22 05/25/2021 0907   ALT 14 01/27/2021 0847   ALT 21 07/14/2017 1245   BILITOT 0.8 05/25/2021 0907   BILITOT 0.6 01/27/2021 0847   BILITOT 0.43 07/14/2017 1245       RADIOGRAPHIC STUDIES: I have personally reviewed the radiological images as listed and agreed with the findings in the report. DG Sacrum/Coccyx  Result Date: 05/11/2021 CLINICAL DATA:  Syncopal episodes 4 days ago.  Fall.  Tailbone pain. EXAM: SACRUM AND COCCYX - 3 VIEW COMPARISON:  None. FINDINGS: No fracture or bone lesion. SI joints normally spaced and aligned. Soft tissues are unremarkable. IMPRESSION: Negative. Electronically Signed   By: Lajean Manes M.D.   On: 05/11/2021 13:33   CT Head Wo Contrast  Result Date: 05/11/2021 CLINICAL DATA:  Syncope and fall. Patient had 2 syncopal episodes 4 days ago. Dizziness and nausea prior to the syncopal episode. Generalized soreness. History of multiple myeloma. EXAM: CT HEAD WITHOUT CONTRAST TECHNIQUE: Contiguous axial images were obtained from the base of the skull through the vertex without intravenous contrast. COMPARISON:  03/09/2021 FINDINGS: Brain: No evidence of acute infarction, hemorrhage, hydrocephalus, extra-axial collection or mass lesion/mass effect. Periventricular white matter hypoattenuation is noted consistent with mild chronic microvascular ischemic change. Vascular: No hyperdense vessel or unexpected calcification. Skull: Previous right frontotemporal craniotomy. No acute fracture. No skull lesion. Sinuses/Orbits:  Visualized globes and orbits are unremarkable. Visualized sinuses are clear. Other: None. IMPRESSION: 1. No acute intracranial abnormalities. 2. Stable changes from a  previous right frontotemporal craniotomy. Stable changes of mild chronic microvascular ischemic change. Electronically Signed   By: Lajean Manes M.D.   On: 05/11/2021 13:33   ECHOCARDIOGRAM COMPLETE  Result Date: 04/29/2021    ECHOCARDIOGRAM REPORT   Patient Name:   ONNA NODAL Date of Exam: 04/29/2021 Medical Rec #:  [id number]      Height:       60.7 in Accession #:    [id number]     Weight:       111.4 lb Date of Birth:  September 07, 1950      BSA:          1.469 m Patient Age:    71 years       BP:           112/90 mmHg Patient Gender: F              HR:           59 bpm. Exam Location:  [facility] Procedure: 2D Echo, Cardiac Doppler and Color Doppler Indications:    R55 Syncope  History:        Patient has no prior history of Echocardiogram examinations.                 Mitral Valve Prolapse; Signs/Symptoms:Dizziness/Lightheadedness                 and Fatigue. Multiple Myeloma Status Post Stem Cell Transplant.  Sonographer:    Deliah Boston RDCS Referring Phys: [place]  1. Left ventricular ejection fraction, by estimation, is 60 to 65%. The left ventricle has normal function. The left ventricle has no regional wall motion abnormalities. Left ventricular diastolic parameters were normal.  2. Right ventricular systolic function is normal. The right ventricular size is normal. There is normal pulmonary artery systolic pressure.  3. Left atrial size was mildly dilated.  4. The mitral valve is normal in structure. trivial late systolic mitral valve regurgitation. No evidence of mitral stenosis. There is mild late systolic prolapse of posterior leaflet of the mitral valve.  5. Tricuspid valve regurgitation is mild to moderate.  6. The aortic valve is tricuspid. There is mild calcification of the aortic valve. There is  mild thickening of the aortic valve. Aortic valve regurgitation is trivial.  7. The inferior vena cava is normal in size with greater than 50% respiratory variability, suggesting right atrial pressure of 3 mmHg. Comparison(s): No prior Echocardiogram. Conclusion(s)/Recommendation(s): Otherwise normal echocardiogram, with minor abnormalities described in the report. FINDINGS  Left Ventricle: Left ventricular ejection fraction, by estimation, is 60 to 65%. The left ventricle has normal function. The left ventricle has no regional wall motion abnormalities. The left ventricular internal cavity size was normal in size. There is  no left ventricular hypertrophy. Left ventricular diastolic parameters were normal. Right Ventricle: The right ventricular size is normal. No increase in right ventricular wall thickness. Right ventricular systolic function is normal. There is normal pulmonary artery systolic pressure. The tricuspid regurgitant velocity is 2.47 m/s, and  with an assumed right atrial pressure of 3 mmHg, the estimated right ventricular systolic pressure is 12.4 mmHg. Left Atrium: Left atrial size was mildly dilated. Right Atrium: Right atrial size was normal in size. Pericardium: There is no evidence of pericardial effusion. Mitral Valve: The mitral valve is normal in structure. There is mild late systolic prolapse of posterior leaflet of the mitral valve. There is mild thickening of the mitral valve leaflet(s). Trivial late systolic mitral valve  regurgitation. No evidence of mitral valve stenosis. Tricuspid Valve: The tricuspid valve is normal in structure. Tricuspid valve regurgitation is mild to moderate. No evidence of tricuspid stenosis. Aortic Valve: The aortic valve is tricuspid. There is mild calcification of the aortic valve. There is mild thickening of the aortic valve. Aortic valve regurgitation is trivial. Pulmonic Valve: The pulmonic valve was grossly normal. Pulmonic valve regurgitation is mild. No  evidence of pulmonic stenosis. Aorta: The aortic root, ascending aorta, aortic arch and descending aorta are all structurally normal, with no evidence of dilitation or obstruction. Venous: The inferior vena cava is normal in size with greater than 50% respiratory variability, suggesting right atrial pressure of 3 mmHg. IAS/Shunts: The atrial septum is grossly normal.  LEFT VENTRICLE PLAX 2D LVOT diam:     2.00 cm  Diastology LV SV:         57       LV e' medial:    6.53 cm/s LV SV Index:   39       LV E/e' medial:  9.6 LVOT Area:     3.14 cm LV e' lateral:   7.93 cm/s                         LV E/e' lateral: 7.9  RIGHT VENTRICLE RV S prime:     10.60 cm/s TAPSE (M-mode): 1.7 cm LEFT ATRIUM             Index       RIGHT ATRIUM           Index LA Vol (A2C):   46.2 ml 31.46 ml/m RA Area:     12.10 cm LA Vol (A4C):   23.2 ml 15.80 ml/m RA Volume:   26.50 ml  18.04 ml/m LA Biplane Vol: 36.3 ml 24.72 ml/m  AORTIC VALVE LVOT Vmax:   81.50 cm/s LVOT Vmean:  51.100 cm/s LVOT VTI:    0.182 m  AORTA Ao Root diam: 2.40 cm Ao Asc diam:  3.10 cm MITRAL VALVE               TRICUSPID VALVE MV Area (PHT): cm         TR Peak grad:   24.4 mmHg MV Decel Time: 240 msec    TR Vmax:        247.00 cm/s MV E velocity: 62.90 cm/s MV A velocity: 86.50 cm/s  SHUNTS MV E/A ratio:  0.73        Systemic VTI:  0.18 m                            Systemic Diam: 2.00 cm Buford Dresser MD Electronically signed by Buford Dresser MD Signature Date/Time: 04/29/2021/12:58:23 PM    Final

## 2021-05-25 NOTE — Assessment & Plan Note (Addendum)
She has recent history of recurrent falls, multifactorial due to dizziness, changes in blood pressure and others I have referred her to see physical therapist and rehab but due to some recent unpleasant side effects, she declined going back For now, she appears motivated to go back once her pain is under control Will reassess at next visit I help her complete an application for a temporarily disability parking permit

## 2021-05-25 NOTE — Assessment & Plan Note (Signed)
I have reviewed her recent myeloma panel She has positive response to therapy and tolerated treatment well Her treatment is not the cause of her recurrent falls We will proceed with treatment without delay

## 2021-05-25 NOTE — Patient Instructions (Addendum)
[facility]   Discharge Instructions: Thank you for choosing [facility] to provide your oncology and hematology care.   If you have a lab appointment with the [facility], please go directly to the [facility] and check in at the registration area.   Wear comfortable clothing and clothing appropriate for easy access to any Portacath or PICC line.   We strive to give you quality time with your provider. You may need to reschedule your appointment if you arrive late (15 or more minutes).  Arriving late affects you and other patients whose appointments are after yours.  Also, if you miss three or more appointments without notifying the office, you may be dismissed from the clinic at the provider's discretion.      For prescription refill requests, have your pharmacy contact our office and allow 72 hours for refills to be completed.    Today you received the following chemotherapy and/or immunotherapy agents: daratumumab/hyaluronidase.      To help prevent nausea and vomiting after your treatment, we encourage you to take your nausea medication as directed.  BELOW ARE SYMPTOMS THAT SHOULD BE REPORTED IMMEDIATELY: *FEVER GREATER THAN 100.4 F (38 C) OR HIGHER *CHILLS OR SWEATING *NAUSEA AND VOMITING THAT IS NOT CONTROLLED WITH YOUR NAUSEA MEDICATION *UNUSUAL SHORTNESS OF BREATH *UNUSUAL BRUISING OR BLEEDING *URINARY PROBLEMS (pain or burning when urinating, or frequent urination) *BOWEL PROBLEMS (unusual diarrhea, constipation, pain near the anus) TENDERNESS IN MOUTH AND THROAT WITH OR WITHOUT PRESENCE OF ULCERS (sore throat, sores in mouth, or a toothache) UNUSUAL RASH, SWELLING OR PAIN  UNUSUAL VAGINAL DISCHARGE OR ITCHING   Items with * indicate a potential emergency and should be followed up as soon as possible or go to the Emergency Department if any problems should occur.  Please show the CHEMOTHERAPY ALERT CARD or IMMUNOTHERAPY ALERT  CARD at check-in to the Emergency Department and triage nurse.  Should you have questions after your visit or need to cancel or reschedule your appointment, please contact [facility]  Dept: [phone]  and follow the prompts.  Office hours are 8:00 a.m. to 4:30 p.m. Monday - Friday. Please note that voicemails left after 4:00 p.m. may not be returned until the following business day.  We are closed weekends and major holidays. You have access to a nurse at all times for urgent questions. Please call the main number to the clinic Dept: [phone] and follow the prompts.   For any non-urgent questions, you may also contact your provider using MyChart. We now offer e-Visits for anyone 44 and older to request care online for non-urgent symptoms. For details visit [email or web address].   Also download the MyChart app! Go to the app store, search "MyChart", open the app, select [facility], and log in with your MyChart username and password.  Due to Covid, a mask is required upon entering the hospital/clinic. If you do not have a mask, one will be given to you upon arrival. For doctor visits, patients may have 1 support person aged 72 or older with them. For treatment visits, patients cannot have anyone with them due to current Covid guidelines and our immunocompromised population.   Influenza (Flu) Vaccine (Inactivated or Recombinant): What You Need to Know 1. Why get vaccinated? Influenza vaccine can prevent influenza (flu). Flu is a contagious disease that spreads around the [place] every year, usually between October and May. Anyone can get the flu, but it is more  dangerous for some people. Infants and young children, people 29 years and older, pregnant people, and people with certain health conditions or a weakened immune system are at greatest risk of flu complications. Pneumonia, bronchitis, sinus infections, and ear infections are examples of flu-related  complications. If you have a medical condition, such as heart disease, cancer, or diabetes, flu can make it worse. Flu can cause fever and chills, sore throat, muscle aches, fatigue, cough, headache, and runny or stuffy nose. Some people may have vomiting and diarrhea, though this is more common in children than adults. In an average year, thousands of people in the [place] die from flu, and many more are hospitalized. Flu vaccine prevents millions of illnesses and flu-related visits to the doctor each year. 2. Influenza vaccines CDC recommends everyone 6 months and older get vaccinated every flu season. Children 6 months through 30 years of age may need 2 doses during a single flu season. Everyone else needs only 1 dose each flu season. It takes about 2 weeks for protection to develop after vaccination. There are many flu viruses, and they are always changing. Each year a new flu vaccine is made to protect against the influenza viruses believed to be likely to cause disease in the upcoming flu season. Even when the vaccine doesn't exactly match these viruses, it may still provide some protection. Influenza vaccine does not cause flu. Influenza vaccine may be given at the same time as other vaccines. 3. Talk with your health care provider Tell your vaccination provider if the person getting the vaccine: Has had an allergic reaction after a previous dose of influenza vaccine, or has any severe, life-threatening allergies Has ever had Guillain-Barr Syndrome (also called "GBS") In some cases, your health care provider may decide to postpone influenza vaccination until a future visit. Influenza vaccine can be administered at any time during pregnancy. People who are or will be pregnant during influenza season should receive inactivated influenza vaccine. People with minor illnesses, such as a cold, may be vaccinated. People who are moderately or severely ill should usually wait until they recover  before getting influenza vaccine. Your health care provider can give you more information. 4. Risks of a vaccine reaction Soreness, redness, and swelling where the shot is given, fever, muscle aches, and headache can happen after influenza vaccination. There may be a very small increased risk of Guillain-Barr Syndrome (GBS) after inactivated influenza vaccine (the flu shot). Young children who get the flu shot along with pneumococcal vaccine (PCV13) and/or DTaP vaccine at the same time might be slightly more likely to have a seizure caused by fever. Tell your health care provider if a child who is getting flu vaccine has ever had a seizure. People sometimes faint after medical procedures, including vaccination. Tell your provider if you feel dizzy or have vision changes or ringing in the ears. As with any medicine, there is a very remote chance of a vaccine causing a severe allergic reaction, other serious injury, or death. 5. What if there is a serious problem? An allergic reaction could occur after the vaccinated person leaves the clinic. If you see signs of a severe allergic reaction (hives, swelling of the face and throat, difficulty breathing, a fast heartbeat, dizziness, or weakness), call 9-1-1 and get the person to the nearest hospital. For other signs that concern you, call your health care provider. Adverse reactions should be reported to the Vaccine Adverse Event Reporting System (VAERS). Your health care provider will usually file  this report, or you can do it yourself. Visit the VAERS website at www.vaers.SamedayNews.es or call (702) 654-2193. VAERS is only for reporting reactions, and VAERS staff members do not give medical advice. 6. The National Vaccine Injury Compensation Program The [employer] Vaccine Injury Compensation Program (VICP) is a federal program that was created to compensate people who may have been injured by certain vaccines. Claims regarding alleged injury or death due to  vaccination have a time limit for filing, which may be as short as two years. Visit the VICP website at GoldCloset.com.ee or call (732) 169-8411 to learn about the program and about filing a claim. 7. How can I learn more? Ask your health care provider. Call your local or state health department. Visit the website of the Food and Drug Administration (FDA) for vaccine package inserts and additional information at TraderRating.uy. Contact the Centers for Disease Control and Prevention (CDC): Call 905 667 3787 (1-800-CDC-INFO) or Visit CDC's website at https://gibson.com/. Vaccine Information Statement Inactivated Influenza Vaccine (03/14/2020) This information is not intended to replace advice given to you by your health care provider. Make sure you discuss any questions you have with your health care provider. Document Revised: 05/01/2020 Document Reviewed: 05/01/2020 Elsevier Patient Education  2022 [employer].

## 2021-05-25 NOTE — Assessment & Plan Note (Signed)
She has severe lower back pain not alleviated by acetaminophen due to recent falls I will prescribe a small amount of oxycodone I warned her about side effects of oxycodone and discussed narcotic refill policy

## 2021-05-26 ENCOUNTER — Encounter: Payer: Medicare Other | Admitting: Physical Therapy

## 2021-05-26 ENCOUNTER — Telehealth: Payer: Self-pay

## 2021-05-26 ENCOUNTER — Other Ambulatory Visit: Payer: Self-pay

## 2021-05-26 DIAGNOSIS — C9002 Multiple myeloma in relapse: Secondary | ICD-10-CM

## 2021-05-26 LAB — KAPPA/LAMBDA LIGHT CHAINS
Kappa free light chain: 21 mg/L — ABNORMAL HIGH (ref 3.3–19.4)
Kappa, lambda light chain ratio: 3.18 — ABNORMAL HIGH (ref 0.26–1.65)
Lambda free light chains: 6.6 mg/L (ref 5.7–26.3)

## 2021-05-26 MED ORDER — POMALIDOMIDE 2 MG PO CAPS
ORAL_CAPSULE | ORAL | 0 refills | Status: DC
Start: 2021-05-26 — End: 2021-06-04

## 2021-05-26 NOTE — Telephone Encounter (Signed)
Returned her call. She wanted to give a update on her back pain. She took the pain medication while in treatment yesterday and her pain is much better. She has not taken the oxycodone at home and will only take if needed.

## 2021-05-27 LAB — MULTIPLE MYELOMA PANEL, SERUM
Albumin SerPl Elph-Mcnc: 3.8 g/dL (ref 2.9–4.4)
Albumin/Glob SerPl: 1.5 (ref 0.7–1.7)
Alpha 1: 0.2 g/dL (ref 0.0–0.4)
Alpha2 Glob SerPl Elph-Mcnc: 0.8 g/dL (ref 0.4–1.0)
B-Globulin SerPl Elph-Mcnc: 1.1 g/dL (ref 0.7–1.3)
Gamma Glob SerPl Elph-Mcnc: 0.5 g/dL (ref 0.4–1.8)
Globulin, Total: 2.7 g/dL (ref 2.2–3.9)
IgA: 21 mg/dL — ABNORMAL LOW (ref 64–422)
IgG (Immunoglobin G), Serum: 618 mg/dL (ref 586–1602)
IgM (Immunoglobulin M), Srm: 18 mg/dL — ABNORMAL LOW (ref 26–217)
M Protein SerPl Elph-Mcnc: 0.4 g/dL — ABNORMAL HIGH
Total Protein ELP: 6.5 g/dL (ref 6.0–8.5)

## 2021-05-28 NOTE — Telephone Encounter (Signed)
Pls call and let her know myeloma panel is stable

## 2021-05-28 NOTE — Telephone Encounter (Signed)
Called and given below message. She verbalized understanding. 

## 2021-06-02 ENCOUNTER — Encounter: Payer: Medicare Other | Admitting: Physical Therapy

## 2021-06-04 ENCOUNTER — Other Ambulatory Visit: Payer: Self-pay

## 2021-06-04 DIAGNOSIS — C9002 Multiple myeloma in relapse: Secondary | ICD-10-CM

## 2021-06-04 MED ORDER — POMALIDOMIDE 2 MG PO CAPS
ORAL_CAPSULE | ORAL | 0 refills | Status: DC
Start: 2021-06-04 — End: 2021-07-08

## 2021-06-11 ENCOUNTER — Encounter: Payer: Medicare Other | Admitting: Physical Therapy

## 2021-06-23 ENCOUNTER — Inpatient Hospital Stay: Payer: Medicare Other

## 2021-06-23 ENCOUNTER — Inpatient Hospital Stay (HOSPITAL_BASED_OUTPATIENT_CLINIC_OR_DEPARTMENT_OTHER): Payer: Medicare Other | Admitting: Hematology and Oncology

## 2021-06-23 ENCOUNTER — Other Ambulatory Visit: Payer: Self-pay

## 2021-06-23 ENCOUNTER — Inpatient Hospital Stay: Payer: Medicare Other | Attending: Hematology and Oncology

## 2021-06-23 ENCOUNTER — Other Ambulatory Visit: Payer: Self-pay | Admitting: Hematology and Oncology

## 2021-06-23 ENCOUNTER — Encounter: Payer: Self-pay | Admitting: Hematology and Oncology

## 2021-06-23 ENCOUNTER — Telehealth: Payer: Self-pay | Admitting: Hematology and Oncology

## 2021-06-23 DIAGNOSIS — Z885 Allergy status to narcotic agent status: Secondary | ICD-10-CM | POA: Diagnosis not present

## 2021-06-23 DIAGNOSIS — Z833 Family history of diabetes mellitus: Secondary | ICD-10-CM | POA: Diagnosis not present

## 2021-06-23 DIAGNOSIS — I952 Hypotension due to drugs: Secondary | ICD-10-CM | POA: Diagnosis not present

## 2021-06-23 DIAGNOSIS — Z9481 Bone marrow transplant status: Secondary | ICD-10-CM

## 2021-06-23 DIAGNOSIS — N183 Chronic kidney disease, stage 3 unspecified: Secondary | ICD-10-CM

## 2021-06-23 DIAGNOSIS — Z880 Allergy status to penicillin: Secondary | ICD-10-CM | POA: Diagnosis not present

## 2021-06-23 DIAGNOSIS — Z923 Personal history of irradiation: Secondary | ICD-10-CM | POA: Insufficient documentation

## 2021-06-23 DIAGNOSIS — Z79899 Other long term (current) drug therapy: Secondary | ICD-10-CM | POA: Diagnosis not present

## 2021-06-23 DIAGNOSIS — R7989 Other specified abnormal findings of blood chemistry: Secondary | ICD-10-CM | POA: Diagnosis not present

## 2021-06-23 DIAGNOSIS — R42 Dizziness and giddiness: Secondary | ICD-10-CM

## 2021-06-23 DIAGNOSIS — Z8262 Family history of osteoporosis: Secondary | ICD-10-CM | POA: Diagnosis not present

## 2021-06-23 DIAGNOSIS — T451X5A Adverse effect of antineoplastic and immunosuppressive drugs, initial encounter: Secondary | ICD-10-CM | POA: Diagnosis not present

## 2021-06-23 DIAGNOSIS — R634 Abnormal weight loss: Secondary | ICD-10-CM | POA: Diagnosis not present

## 2021-06-23 DIAGNOSIS — R296 Repeated falls: Secondary | ICD-10-CM

## 2021-06-23 DIAGNOSIS — C9002 Multiple myeloma in relapse: Secondary | ICD-10-CM | POA: Insufficient documentation

## 2021-06-23 DIAGNOSIS — Z7189 Other specified counseling: Secondary | ICD-10-CM

## 2021-06-23 DIAGNOSIS — C7931 Secondary malignant neoplasm of brain: Secondary | ICD-10-CM | POA: Diagnosis not present

## 2021-06-23 DIAGNOSIS — Z886 Allergy status to analgesic agent status: Secondary | ICD-10-CM | POA: Insufficient documentation

## 2021-06-23 DIAGNOSIS — Z9484 Stem cells transplant status: Secondary | ICD-10-CM | POA: Diagnosis not present

## 2021-06-23 DIAGNOSIS — Z7961 Long term (current) use of immunomodulator: Secondary | ICD-10-CM | POA: Diagnosis not present

## 2021-06-23 DIAGNOSIS — D702 Other drug-induced agranulocytosis: Secondary | ICD-10-CM | POA: Diagnosis not present

## 2021-06-23 DIAGNOSIS — Z5112 Encounter for antineoplastic immunotherapy: Secondary | ICD-10-CM | POA: Insufficient documentation

## 2021-06-23 DIAGNOSIS — Z881 Allergy status to other antibiotic agents status: Secondary | ICD-10-CM | POA: Diagnosis not present

## 2021-06-23 DIAGNOSIS — C9001 Multiple myeloma in remission: Secondary | ICD-10-CM

## 2021-06-23 DIAGNOSIS — Z887 Allergy status to serum and vaccine status: Secondary | ICD-10-CM | POA: Diagnosis not present

## 2021-06-23 DIAGNOSIS — G620 Drug-induced polyneuropathy: Secondary | ICD-10-CM | POA: Diagnosis not present

## 2021-06-23 LAB — CBC WITH DIFFERENTIAL/PLATELET
Abs Immature Granulocytes: 0 10*3/uL (ref 0.00–0.07)
Basophils Absolute: 0.2 10*3/uL — ABNORMAL HIGH (ref 0.0–0.1)
Basophils Relative: 4 %
Eosinophils Absolute: 0.4 10*3/uL (ref 0.0–0.5)
Eosinophils Relative: 8 %
HCT: 35.6 % — ABNORMAL LOW (ref 36.0–46.0)
Hemoglobin: 11.6 g/dL — ABNORMAL LOW (ref 12.0–15.0)
Immature Granulocytes: 0 %
Lymphocytes Relative: 58 %
Lymphs Abs: 2.7 10*3/uL (ref 0.7–4.0)
MCH: 28 pg (ref 26.0–34.0)
MCHC: 32.6 g/dL (ref 30.0–36.0)
MCV: 85.8 fL (ref 80.0–100.0)
Monocytes Absolute: 0.6 10*3/uL (ref 0.1–1.0)
Monocytes Relative: 13 %
Neutro Abs: 0.8 10*3/uL — ABNORMAL LOW (ref 1.7–7.7)
Neutrophils Relative %: 17 %
Platelets: 276 10*3/uL (ref 150–400)
RBC: 4.15 MIL/uL (ref 3.87–5.11)
RDW: 16.5 % — ABNORMAL HIGH (ref 11.5–15.5)
WBC: 4.6 10*3/uL (ref 4.0–10.5)
nRBC: 0 % (ref 0.0–0.2)

## 2021-06-23 LAB — COMPREHENSIVE METABOLIC PANEL
ALT: 11 U/L (ref 0–44)
AST: 16 U/L (ref 15–41)
Albumin: 4.4 g/dL (ref 3.5–5.0)
Alkaline Phosphatase: 68 U/L (ref 38–126)
Anion gap: 12 (ref 5–15)
BUN: 19 mg/dL (ref 8–23)
CO2: 23 mmol/L (ref 22–32)
Calcium: 9.6 mg/dL (ref 8.9–10.3)
Chloride: 105 mmol/L (ref 98–111)
Creatinine, Ser: 1.18 mg/dL — ABNORMAL HIGH (ref 0.44–1.00)
GFR, Estimated: 49 mL/min — ABNORMAL LOW (ref >60)
Glucose, Bld: 86 mg/dL (ref 70–99)
Potassium: 3.8 mmol/L (ref 3.5–5.1)
Sodium: 140 mmol/L (ref 135–145)
Total Bilirubin: 0.9 mg/dL (ref 0.3–1.2)
Total Protein: 7.5 g/dL (ref 6.5–8.1)

## 2021-06-23 MED ORDER — DEXAMETHASONE 4 MG PO TABS
8.0000 mg | ORAL_TABLET | Freq: Once | ORAL | Status: AC
  Administered 2021-06-23: 8 mg via ORAL
  Filled 2021-06-23: qty 2

## 2021-06-23 MED ORDER — ACETAMINOPHEN 325 MG PO TABS
650.0000 mg | ORAL_TABLET | Freq: Once | ORAL | Status: AC
  Administered 2021-06-23: 650 mg via ORAL
  Filled 2021-06-23: qty 2

## 2021-06-23 MED ORDER — MONTELUKAST SODIUM 10 MG PO TABS
10.0000 mg | ORAL_TABLET | Freq: Once | ORAL | Status: AC
  Administered 2021-06-23: 10 mg via ORAL
  Filled 2021-06-23: qty 1

## 2021-06-23 MED ORDER — DARATUMUMAB-HYALURONIDASE-FIHJ 1800-30000 MG-UT/15ML ~~LOC~~ SOLN
1800.0000 mg | Freq: Once | SUBCUTANEOUS | Status: AC
  Administered 2021-06-23: 1800 mg via SUBCUTANEOUS
  Filled 2021-06-23: qty 15

## 2021-06-23 MED ORDER — CYANOCOBALAMIN 1000 MCG/ML IJ SOLN
1000.0000 ug | Freq: Once | INTRAMUSCULAR | Status: AC
  Administered 2021-06-23: 1000 ug via INTRAMUSCULAR
  Filled 2021-06-23: qty 1

## 2021-06-23 NOTE — Assessment & Plan Note (Signed)
She was evaluated by cardiologist Her postural dizziness is not improving with meclizine and I discontinue meclizine because of risk of falls I recommend referral to see neurologist and she is in agreement

## 2021-06-23 NOTE — Assessment & Plan Note (Signed)
Her blood pressure is low and she has slightly elevated creatinine I recommend discontinuation of diuretic therapy I am wondering whether this could have contributed to her dizziness

## 2021-06-23 NOTE — Progress Notes (Signed)
[facility] OFFICE PROGRESS NOTE  Patient Care Team: Josetta Huddle, MD as PCP - General (Internal Medicine) Buford Dresser, MD as PCP - Cardiology (Cardiology) Ginette Pitman, MD as Consulting Physician (Hematology and Oncology) Hessie Dibble, MD as Consulting Physician (Hematology and Oncology)  ASSESSMENT & PLAN:  Multiple myeloma in relapse [facility]) I have reviewed her recent myeloma panel She has positive response to therapy and tolerated treatment well, except for mild neutropenia Her treatment is not the cause of her recurrent falls or dizziness We will proceed with treatment without delay  Drug-induced neutropenia [facility]) This is likely due to recent treatment. The patient denies recent history of fevers, cough, chills, diarrhea or dysuria. She is asymptomatic from the leukopenia. I will observe for now.  I will continue the chemotherapy at current dose without dosage adjustment.  If the leukopenia gets progressive worse in the future, I might have to delay her treatment or adjust the chemotherapy dose.    Drug-induced hypotension Her blood pressure is low and she has slightly elevated creatinine I recommend discontinuation of diuretic therapy I am wondering whether this could have contributed to her dizziness  Chronic kidney disease, stage III (moderate) She has intermittent elevated serum creatinine I recommend discontinuation of diuretic completely  Postural dizziness She was evaluated by cardiologist Her postural dizziness is not improving with meclizine and I discontinue meclizine because of risk of falls I recommend referral to see neurologist and she is in agreement  No orders of the defined types were placed in this encounter.   All questions were answered. The patient knows to call the clinic with any problems, questions or concerns. The total time spent in the appointment was 30 minutes encounter with patients including review of chart  and various tests results, discussions about plan of care and coordination of care plan   [name], MD 06/23/2021 9:29 AM  INTERVAL HISTORY: Please see below for problem oriented charting. she returns for treatment follow-up on fast pro and Pomalyst for recurrent multiple myeloma She tolerated treatment well except for mild neutropenia No recent infection She continues to complain of dizziness She felt that meclizine is not helpful and that was prescribed a long time ago for dizziness She denies recent falls Her appetite is fair although she has lost some weight  REVIEW OF SYSTEMS:   Constitutional: Denies fevers, chills or abnormal weight loss Eyes: Denies blurriness of vision Ears, nose, mouth, throat, and face: Denies mucositis or sore throat Respiratory: Denies cough, dyspnea or wheezes Cardiovascular: Denies palpitation, chest discomfort or lower extremity swelling Gastrointestinal:  Denies nausea, heartburn or change in bowel habits Skin: Denies abnormal skin rashes Lymphatics: Denies new lymphadenopathy or easy bruising Behavioral/Psych: Mood is stable, no new changes  All other systems were reviewed with the patient and are negative.  I have reviewed the past medical history, past surgical history, social history and family history with the patient and they are unchanged from previous note.  ALLERGIES:  is allergic to augmentin [amoxicillin-pot clavulanate], albuterol, codeine, ibuprofen, nsaids, tolmetin, and tetanus-diphth-acell pertussis.  MEDICATIONS:  Current Outpatient Medications  Medication Sig Dispense Refill   acetaminophen (TYLENOL) 500 MG tablet Take 500-1,000 mg by mouth every 6 (six) hours as needed for mild pain, moderate pain, fever or headache.     acyclovir (ZOVIRAX) 400 MG tablet TAKE 1 TABLET BY MOUTH TWICE DAILY 180 tablet 3   aspirin EC 81 MG tablet Take 81 mg by mouth daily with breakfast.  calcium carbonate (OSCAL) 1500 (600 Ca) MG TABS tablet  Take 1,500 mg by mouth daily with breakfast.     Calcium Carbonate 500 MG CHEW Chew 500 mg by mouth as needed for indigestion.     Cholecalciferol (VITAMIN D3) 2000 units capsule Take 2,000 Units by mouth daily.      fluorometholone (FML) 0.1 % ophthalmic suspension Place 1 drop into both eyes 3 (three) times daily. (Patient not taking: Reported on 04/14/2021)     loratadine (CLARITIN) 10 MG tablet Take 10 mg by mouth daily.     Multiple Vitamins-Minerals (CENTRUM SILVER PO) Take 1 tablet by mouth daily.      omeprazole (PRILOSEC) 20 MG capsule TAKE 1 CAPSULE(20 MG) BY MOUTH DAILY 90 capsule 11   ondansetron (ZOFRAN) 8 MG tablet TAKE 1 TABLET(8 MG) BY MOUTH EVERY 8 HOURS AS NEEDED FOR NAUSEA OR VOMITING 60 tablet 11   oxyCODONE (OXY IR/ROXICODONE) 5 MG immediate release tablet Take 1 tablet (5 mg total) by mouth every 4 (four) hours as needed for severe pain. 30 tablet 0   pomalidomide (POMALYST) 2 MG capsule Take 1 pill daily for 14 days then rest 7 days for cycle of every 21 days 14 capsule 0   senna-docusate (SENOKOT-S) 8.6-50 MG tablet Take 1-2 tablets by mouth daily as needed for mild constipation or moderate constipation.      sodium chloride (OCEAN) 0.65 % SOLN nasal spray Place 1 spray into both nostrils as needed for congestion.     triamcinolone ointment (KENALOG) 0.1 % Apply 1 application topically daily.     vitamin B-12 (CYANOCOBALAMIN) 500 MCG tablet Take 500 mcg by mouth daily.     No current facility-administered medications for this visit.   Facility-Administered Medications Ordered in Other Visits  Medication Dose Route Frequency Provider Last Rate Last Admin   0.9 %  sodium chloride infusion   Intravenous Once Alvy Bimler, Carlin Mamone, MD       daratumumab-hyaluronidase-fihj (DARZALEX FASPRO) 1800-30000 MG-UT/15ML chemo SQ injection 1,800 mg  1,800 mg Subcutaneous Once Alvy Bimler, Ezekial Arns, MD        SUMMARY OF ONCOLOGIC HISTORY: Oncology History Overview Note   M-protein 0.69 gm/dl IFIX - IgG,  Kappa IgG - 868 IgA - 19 IgM - < 20 Kappa - 21 Lambda - 5.7  09/06/2014 - Bone marrow aspirate and biopsy:   Normocellular marrow for age (40%) with a small monoclonal plasma cell population (1% on aspirate). Karyotype 22, XX  FISH Negative for myeloma associated changes  09/12/2014 - PET/CT  Two regions that are concerning for disease, one adjacent/involving the left ninth rib and one in the marrow of the right femur, in this patient with history of plasmacytoma.    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   04/01/2015 Bone Marrow Transplant   She received melphalan chemotherapy on 03/31/2015 followed by autologous stem cell transplant the day after   04/03/2015 - 04/18/2015 Hospital Admission   The patient was admitted to the hospital at [facility] for management related to complication from stem cell transplant. She had significant nausea requiring intravenous anti-emetics.   07/17/2015 -  Chemotherapy   She started maintenance Revlimid and monthly  zometa, then every 3 months   06/01/2018 Imaging   DEXA scan showed bone density T score in femur -2.3   04/20/2019 PET scan   1. No FDG avid osseous lesions or mass identified to suggest metabolically active lesion of myeloma or plasmacytoma. 2. Small nodular density within the paravertebral right lower lobe exhibits mild to moderate increased uptake within SUV max of 3.38. This is indeterminate. Review of CT chest from 10/05/2018 shows a corresponding Lung nodule in this area measuring the same. Small pulmonary neoplasm cannot be excluded. 3. Indeterminate, focal area of increased uptake is identified within the thoracic canal. Indeterminate favored to represent benign physiologic CNS activity.     04/30/2019 -  Chemotherapy   Patient is on Treatment Plan : MYELOMA SQ Daratumumab Faspro q28d     04/30/2019 - 07/09/2019 Chemotherapy   The patient had bortezomib SQ (VELCADE) chemo injection 1.75 mg, 1.3 mg/m2 = 1.75 mg, Subcutaneous,  Once, 10 of 11 cycles Administration: 1.75 mg (04/30/2019), 1.75 mg (05/07/2019), 1.75 mg (05/14/2019), 1.75 mg (05/21/2019), 1.75 mg (05/28/2019), 1.75 mg (06/04/2019), 1.75 mg (06/11/2019), 1.75 mg (06/18/2019), 1.75 mg (06/25/2019), 1.75 mg (07/09/2019)   for chemotherapy treatment.       PHYSICAL EXAMINATION: ECOG PERFORMANCE STATUS: 2 - Symptomatic, <50% confined to bed  Vitals:   06/23/21 0820  BP: (!) 106/51  Pulse: 79  Resp: 18  Temp: (!) 97.5 F (36.4 C)  SpO2: 100%   Filed Weights   06/23/21 0820  Weight: 110 lb 9.6 oz (50.2 kg)    GENERAL:alert, no distress and comfortable SKIN: skin color, texture, turgor are normal, no rashes or significant lesions EYES: normal, Conjunctiva are pink and non-injected, sclera clear OROPHARYNX:no exudate, no erythema and lips, buccal mucosa, and tongue normal  NECK: supple, thyroid normal size, non-tender, without nodularity LYMPH:  no palpable lymphadenopathy in the cervical, axillary or inguinal LUNGS:  clear to auscultation and percussion with normal breathing effort HEART: regular rate & rhythm and no murmurs and no lower extremity edema ABDOMEN:abdomen soft, non-tender and normal bowel sounds Musculoskeletal:no cyanosis of digits and no clubbing  NEURO: alert & oriented x 3 with fluent speech, no focal motor/sensory deficits  LABORATORY DATA:  I have reviewed the data as listed    Component Value Date/Time   NA 140 06/23/2021 0758   NA 143 07/14/2017 1245   K 3.8 06/23/2021 0758   K 3.3 (L) 07/14/2017 1245   CL 105 06/23/2021 0758   CO2 23 06/23/2021 0758   CO2 20 (L) 07/14/2017 1245   GLUCOSE 86 06/23/2021 0758   GLUCOSE 106 07/14/2017 1245   BUN 19 06/23/2021 0758   BUN 9.3 07/14/2017 1245   CREATININE 1.18 (H) 06/23/2021 0758   CREATININE 1.01 (H) 01/27/2021 0847   CREATININE 0.8 07/14/2017  1245   CALCIUM 9.6 06/23/2021 0758   CALCIUM 8.4 07/14/2017 1245   PROT 7.5 06/23/2021 0758   PROT 6.0 07/14/2017 1245   PROT 6.2 (L) 07/14/2017 1245   ALBUMIN 4.4 06/23/2021 0758   ALBUMIN 3.6 07/14/2017 1245   AST 16 06/23/2021 0758   AST 21 01/27/2021 0847   AST 21 07/14/2017 1245   ALT 11 06/23/2021 0758   ALT 14 01/27/2021 0847   ALT 21 07/14/2017 1245   ALKPHOS 68 06/23/2021 0758   ALKPHOS 78 07/14/2017 1245   BILITOT 0.9 06/23/2021 0758   BILITOT 0.6 01/27/2021 0847   BILITOT 0.43 07/14/2017 1245   GFRNONAA 49 (L) 06/23/2021 0758   GFRNONAA 60 (L) 01/27/2021 0847   GFRAA 57 (L) 04/21/2020 0817   GFRAA >60 01/14/2020 0828    No results found for: SPEP, UPEP  Lab Results  Component Value Date   WBC 4.6 06/23/2021   NEUTROABS 0.8 (L) 06/23/2021   HGB 11.6 (L) 06/23/2021   HCT 35.6 (L) 06/23/2021   MCV 85.8 06/23/2021   PLT 276 06/23/2021      Chemistry      Component Value Date/Time   NA 140 06/23/2021 0758   NA 143 07/14/2017 1245   K 3.8 06/23/2021 0758   K 3.3 (L) 07/14/2017 1245   CL 105 06/23/2021 0758   CO2 23 06/23/2021 0758   CO2 20 (L)  07/14/2017 1245   BUN 19 06/23/2021 0758   BUN 9.3 07/14/2017 1245   CREATININE 1.18 (H) 06/23/2021 0758   CREATININE 1.01 (H) 01/27/2021 0847   CREATININE 0.8 07/14/2017 1245      Component Value Date/Time   CALCIUM 9.6 06/23/2021 0758   CALCIUM 8.4 07/14/2017 1245   ALKPHOS 68 06/23/2021 0758   ALKPHOS 78 07/14/2017 1245   AST 16 06/23/2021 0758   AST 21 01/27/2021 0847   AST 21 07/14/2017 1245   ALT 11 06/23/2021 0758   ALT 14 01/27/2021 0847   ALT 21 07/14/2017 1245   BILITOT 0.9 06/23/2021 0758   BILITOT 0.6 01/27/2021 0847   BILITOT 0.43 07/14/2017 1245

## 2021-06-23 NOTE — Assessment & Plan Note (Signed)
I have reviewed her recent myeloma panel She has positive response to therapy and tolerated treatment well, except for mild neutropenia Her treatment is not the cause of her recurrent falls or dizziness We will proceed with treatment without delay

## 2021-06-23 NOTE — Patient Instructions (Signed)
[facility]ICAL [facility]COLOG[facility]   ?Discharge Instructions: ?Thank you for choosing [facility] to provide your oncology and hematology care.  ? ?If you have a lab appointment with the Cancer Center, please go directly to the Cancer Center and check in at the registration area. ?  ?Wear comfortable clothing and clothing appropriate for easy access to any Portacath or PICC line.  ? ?We strive to give you quality time with your provider. You may need to reschedule your appointment if you arrive late (15 or more minutes).  Arriving late affects you and other patients whose appointments are after yours.  Also, if you miss three or more appointments without notifying the office, you may be dismissed from the clinic at the provider?s discretion.    ?  ?For prescription refill requests, have your pharmacy contact our office and allow 72 hours for refills to be completed.   ? ?Today you received the following chemotherapy and/or immunotherapy agents: daratumumab-hyaluronidase    ?  ?To help prevent nausea and vomiting after your treatment, we encourage you to take your nausea medication as directed. ? ?BELOW ARE SYMPTOMS THAT SHOULD BE REPORTED IMMEDIATELY: ?*FEVER GREATER THAN 100.4 F (38 ?C) OR HIGHER ?*CHILLS OR SWEATING ?*NAUSEA AND VOMITING THAT IS NOT CONTROLLED WITH YOUR NAUSEA MEDICATION ?*UNUSUAL SHORTNESS OF BREATH ?*UNUSUAL BRUISING OR BLEEDING ?*URINARY PROBLEMS (pain or burning when urinating, or frequent urination) ?*BOWEL PROBLEMS (unusual diarrhea, constipation, pain near the anus) ?TENDERNESS IN MOUTH AND THROAT WITH OR WITHOUT PRESENCE OF ULCERS (sore throat, sores in mouth, or a toothache) ?UNUSUAL RASH, SWELLING OR PAIN  ?UNUSUAL VAGINAL DISCHARGE OR ITCHING  ? ?Items with * indicate a potential emergency and should be followed up as soon as possible or go to the Emergency Department if any problems should occur. ? ?Please show the CHEMOTHERAPY ALERT CARD or IMMUNOTHERAPY ALERT  CARD at check-in to the Emergency Department and triage nurse. ? ?Should you have questions after your visit or need to cancel or reschedule your appointment, please contact [facility]  Dept: [phone]  and follow the prompts.  Office hours are 8:00 a.m. to 4:30 p.m. Monday - Friday. Please note that voicemails left after 4:00 p.m. may not be returned until the following business day.  We are closed weekends and major holidays. You have access to a nurse at all times for urgent questions. Please call the main number to the clinic Dept: [phone] and follow the prompts. ? ? ?For any non-urgent questions, you may also contact your provider using MyChart. We now offer e-Visits for anyone 18 and older to request care online for non-urgent symptoms. For details visit [email or web address]. ?  ?Also download the MyChart app! Go to the app store, search "MyChart", open the app, select [facility], and log in with your MyChart username and password. ? ?Due to Covid, a mask is required upon entering the hospital/clinic. If you do not have a mask, one will be given to you upon arrival. For doctor visits, patients may have 1 support person aged 18 or older with them. For treatment visits, patients cannot have anyone with them due to current Covid guidelines and our immunocompromised population.  ? ?

## 2021-06-23 NOTE — Assessment & Plan Note (Signed)
She has intermittent elevated serum creatinine I recommend discontinuation of diuretic completely

## 2021-06-23 NOTE — Progress Notes (Signed)
Ok to treat with ANC 0.8 today per Dr Alvy Bimler

## 2021-06-23 NOTE — Assessment & Plan Note (Signed)
This is likely due to recent treatment. The patient denies recent history of fevers, cough, chills, diarrhea or dysuria. She is asymptomatic from the leukopenia. I will observe for now.  I will continue the chemotherapy at current dose without dosage adjustment.  If the leukopenia gets progressive worse in the future, I might have to delay her treatment or adjust the chemotherapy dose.   

## 2021-06-23 NOTE — Telephone Encounter (Signed)
Scheduled appt per 11/15 referral. Pt is aware of appt date and time.  

## 2021-06-24 LAB — KAPPA/LAMBDA LIGHT CHAINS
Kappa free light chain: 32.9 mg/L — ABNORMAL HIGH (ref 3.3–19.4)
Kappa, lambda light chain ratio: 5.98 — ABNORMAL HIGH (ref 0.26–1.65)
Lambda free light chains: 5.5 mg/L — ABNORMAL LOW (ref 5.7–26.3)

## 2021-06-26 LAB — MULTIPLE MYELOMA PANEL, SERUM
Albumin SerPl Elph-Mcnc: 4.1 g/dL (ref 2.9–4.4)
Albumin/Glob SerPl: 1.4 (ref 0.7–1.7)
Alpha 1: 0.3 g/dL (ref 0.0–0.4)
Alpha2 Glob SerPl Elph-Mcnc: 0.9 g/dL (ref 0.4–1.0)
B-Globulin SerPl Elph-Mcnc: 1.2 g/dL (ref 0.7–1.3)
Gamma Glob SerPl Elph-Mcnc: 0.6 g/dL (ref 0.4–1.8)
Globulin, Total: 3 g/dL (ref 2.2–3.9)
IgA: 25 mg/dL — ABNORMAL LOW (ref 64–422)
IgG (Immunoglobin G), Serum: 656 mg/dL (ref 586–1602)
IgM (Immunoglobulin M), Srm: 10 mg/dL — ABNORMAL LOW (ref 26–217)
M Protein SerPl Elph-Mcnc: 0.4 g/dL — ABNORMAL HIGH
Total Protein ELP: 7.1 g/dL (ref 6.0–8.5)

## 2021-06-29 ENCOUNTER — Telehealth: Payer: Self-pay

## 2021-06-29 NOTE — Telephone Encounter (Signed)
Patient informed about recent myeloma panel and confirmed that she would be keeping her appointment with Dr. Mickeal Skinner on 11/28. Patient appreciative of call.

## 2021-06-29 NOTE — Telephone Encounter (Signed)
-----   Message from [name], MD sent at 06/29/2021  9:21 AM EST ----- Pls let her know myeloma panel is stable Remind her to keep appt to see Dr. Mickeal Skinner

## 2021-07-06 ENCOUNTER — Ambulatory Visit: Payer: Medicare Other | Admitting: Internal Medicine

## 2021-07-06 ENCOUNTER — Other Ambulatory Visit: Payer: Self-pay

## 2021-07-06 ENCOUNTER — Inpatient Hospital Stay: Payer: Medicare Other | Admitting: Internal Medicine

## 2021-07-06 VITALS — BP 146/84 | HR 72 | Temp 97.7°F | Resp 17 | Wt 116.9 lb

## 2021-07-06 DIAGNOSIS — T451X5A Adverse effect of antineoplastic and immunosuppressive drugs, initial encounter: Secondary | ICD-10-CM | POA: Diagnosis not present

## 2021-07-06 DIAGNOSIS — C9030 Solitary plasmacytoma not having achieved remission: Secondary | ICD-10-CM | POA: Diagnosis not present

## 2021-07-06 DIAGNOSIS — Z5112 Encounter for antineoplastic immunotherapy: Secondary | ICD-10-CM | POA: Diagnosis not present

## 2021-07-06 DIAGNOSIS — G620 Drug-induced polyneuropathy: Secondary | ICD-10-CM

## 2021-07-06 DIAGNOSIS — R42 Dizziness and giddiness: Secondary | ICD-10-CM

## 2021-07-06 NOTE — Progress Notes (Signed)
[facility] [address], [place] [address] [phone]   New Patient Evaluation  Date of Service: 07/06/21 Patient Name: Jessica [name] Patient MRN: [id number] Patient DOB: 1951-04-28 Provider: Ventura Sellers, MD  Identifying Statement:  Jessica [name] is a 70 y.o. 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    Referring Provider: Josetta Huddle, MD [place] [employer] [address],  [place] [address]  Primary Cancer:  Oncologic History: Oncology History Overview Note   M-protein 0.69 gm/dl IFIX - IgG, Kappa IgG - 868 IgA - 19 IgM - < 20 Kappa - 21 Lambda - 5.7  09/06/2014 - Bone marrow aspirate and biopsy:   Normocellular marrow for age (40%) with a small monoclonal plasma cell population (1% on aspirate). Karyotype 25, XX  FISH Negative for myeloma associated changes  09/12/2014 - PET/CT  Two regions that are concerning for disease, one adjacent/involving the left ninth rib and one in the marrow of the right femur, in this patient with history of plasmacytoma.    Multiple myeloma in relapse ([place])  06/10/2014 Imaging   MRI brain showed tumor filling the cavernous sinus on the right measuring approximately 2.6 x 1.4 x 1.9 cm, most consistent with meningioma.There is encasement of the internal carotid artery, extension into the orbital apex, medial sella, and sphenoid    08/21/2014 Surgery    she underwent orbital craniectomy and pathology is consistent for plasmacytoma   09/06/2014 Bone Marrow Biopsy   BM performed at [facility] is not consistent with multiple myeloma, 1% plasma cell on aspirate   09/12/2014 Imaging    PET CT scan show involvement of left  ninth rib and right femur   09/23/2014 - 10/23/2014 Radiation Therapy    she had radiation therapy to the cavernous sinus and skull base lesions, 45 Gy   10/21/2014 - 11/01/2014 Radiation Therapy    she had radiation to right femur , total 30 Gy   11/26/2014 - 02/14/2015 Chemotherapy    she is started on weekly dexamethasone, Velcade twice a week on day 1, 4, 8 and 11 and Revlimid days 1-14.   04/01/2015 Bone Marrow Transplant   She received melphalan chemotherapy on 03/31/2015 followed by autologous stem cell transplant the day after   04/03/2015 - 04/18/2015 Hospital Admission   The patient was admitted to the hospital at [facility] for management related to complication from stem cell transplant. She had significant nausea requiring intravenous anti-emetics.   07/17/2015 -  Chemotherapy   She started maintenance Revlimid and monthly zometa, then every 3 months   06/01/2018 Imaging   DEXA scan showed bone density T score in femur -2.3   04/20/2019 PET scan   1. No FDG avid osseous lesions or mass identified to suggest metabolically active lesion of myeloma or plasmacytoma. 2. Small nodular density within the paravertebral right lower lobe exhibits mild to moderate increased uptake within SUV max of 3.38. This is indeterminate. Review of CT chest from 10/05/2018 shows a corresponding Lung nodule in this area measuring the same. Small pulmonary neoplasm cannot be excluded. 3. Indeterminate, focal area of increased uptake is identified within the thoracic canal. Indeterminate favored to represent benign physiologic CNS activity.  04/30/2019 -  Chemotherapy   Patient is on Treatment Plan : MYELOMA SQ Daratumumab Faspro q28d     04/30/2019 - 07/09/2019 Chemotherapy   The patient had bortezomib SQ (VELCADE) chemo injection 1.75 mg, 1.3 mg/m2 = 1.75 mg, Subcutaneous,  Once, 10 of 11 cycles Administration: 1.75 mg (04/30/2019), 1.75 mg (05/07/2019), 1.75 mg (05/14/2019), 1.75 mg (05/21/2019), 1.75 mg  (05/28/2019), 1.75 mg (06/04/2019), 1.75 mg (06/11/2019), 1.75 mg (06/18/2019), 1.75 mg (06/25/2019), 1.75 mg (07/09/2019)   for chemotherapy treatment.       History of Present Illness: The patient's records from the referring physician were obtained and reviewed and the patient interviewed to confirm this HPI.  Jessica [name] presents today for evaluation of recent postural dizziness.  She describes "dizziness in my head" that occurs every time upon standing from seated position, symptoms last for seconds to minutes before almost subsiding.  Duration has been at least 3 months.  Per husband, she has "passed out briefly" at least 3 times.  Thorough cardiac workup has been performed by Dr. Harrell Gave, without any abnormality.  She also describes numbness in her toes for the past "year or two".  Her right eye vision is impaired chronically following brain plasmacytoma surgery and radiation in 2016, it is somewhat worse now however.  She otherwise is on observation for myeloma with Dr. Alvy Bimler.    Medications: Current Outpatient Medications on File Prior to Visit  Medication Sig Dispense Refill   acetaminophen (TYLENOL) 500 MG tablet Take 500-1,000 mg by mouth every 6 (six) hours as needed for mild pain, moderate pain, fever or headache.     acyclovir (ZOVIRAX) 400 MG tablet TAKE 1 TABLET BY MOUTH TWICE DAILY 180 tablet 3   aspirin EC 81 MG tablet Take 81 mg by mouth daily with breakfast.     calcium carbonate (OSCAL) 1500 (600 Ca) MG TABS tablet Take 1,500 mg by mouth daily with breakfast.     Cholecalciferol (VITAMIN D3) 2000 units capsule Take 2,000 Units by mouth daily.      loratadine (CLARITIN) 10 MG tablet Take 10 mg by mouth daily.     Multiple Vitamins-Minerals (CENTRUM SILVER PO) Take 1 tablet by mouth daily.      omeprazole (PRILOSEC) 20 MG capsule TAKE 1 CAPSULE(20 MG) BY MOUTH DAILY 90 capsule 11   ondansetron (ZOFRAN) 8 MG tablet TAKE 1 TABLET(8 MG) BY MOUTH EVERY 8 HOURS AS NEEDED  FOR NAUSEA OR VOMITING 60 tablet 11   pomalidomide (POMALYST) 2 MG capsule Take 1 pill daily for 14 days then rest 7 days for cycle of every 21 days 14 capsule 0   sodium chloride (OCEAN) 0.65 % SOLN nasal spray Place 1 spray into both nostrils as needed for congestion.     vitamin B-12 (CYANOCOBALAMIN) 500 MCG tablet Take 500 mcg by mouth daily.     Calcium Carbonate 500 MG CHEW Chew 500 mg by mouth as needed for indigestion. (Patient not taking: Reported on 07/06/2021)     senna-docusate (SENOKOT-S) 8.6-50 MG tablet Take 1-2 tablets by mouth daily as needed for mild constipation or moderate constipation.  (Patient not taking: Reported on 07/06/2021)     Current Facility-Administered Medications on File Prior to Visit  Medication Dose Route Frequency Provider Last Rate Last Admin   0.9 %  sodium chloride infusion   Intravenous Once [name], MD        Allergies:  Allergies  Allergen Reactions   Augmentin [Amoxicillin-Pot Clavulanate] Hives, Itching, Nausea And Vomiting  and Rash    ++Tolerates Cefepime++ Has patient had a PCN reaction causing immediate rash, facial/tongue/throat swelling, SOB or lightheadedness with hypotension: Yes Has patient had a PCN reaction causing severe rash involving mucus membranes or skin necrosis: Yes Has patient had a PCN reaction that required hospitalization No Has patient had a PCN reaction occurring within the last 10 years: Yes If all of the above answers are "NO", then may proceed with Cephalosporin use. *okay to take other types of cillin   Albuterol Other (See Comments)    Elevated HR and BP   Codeine Nausea Only   Ibuprofen Nausea Only   Nsaids Other (See Comments)    Gi Upset   Tolmetin Other (See Comments)    Gi Upset   Tetanus-Diphth-Acell Pertussis Rash    Received injection in ER on 8/1, developed fine rash all over body. Cellulitis to injection site on left deltoid.   Past Medical History:  Past Medical History:  Diagnosis Date    H/O stem cell transplant [facility]) 03/2015   [facility]   Multiple myeloma [facility]) 11/19/2014   Multiple myeloma (HCC)    Multiple myeloma (HCC)    MVP (mitral valve prolapse)    req prophylaxis   Other constipation 12/03/2014   Right ovarian cyst 10/16/15   16 mm simple cyst - ultrasound yearly.   Upper respiratory infection, acute 08/12/2015   Past Surgical History:  Past Surgical History:  Procedure Laterality Date   CATARACT EXTRACTION, BILATERAL  10/2010   Implants(ReSTOR) Bilat   LASER ABLATION OF CONDYLOMAS  1990   CIN 1 cervix   multiple myeloma surgery Right 08/2014   [facility]   Social History:  Social History   Socioeconomic History   Marital status: Married    Spouse name: Not on file   Number of children: Not on file   Years of education: Not on file   Highest education level: Not on file  Occupational History   Not on file  Tobacco Use   Smoking status: Never   Smokeless tobacco: Never  Vaping Use   Vaping Use: Never used  Substance and Sexual Activity   Alcohol use: No    Alcohol/week: 0.0 standard drinks   Drug use: No   Sexual activity: Not Currently    Partners: Male    Birth control/protection: Post-menopausal    Comment: vasectomy  Other Topics Concern   Not on file  Social History Narrative   Not on file   Social Determinants of Health   Financial Resource Strain: Not on file  Food Insecurity: Not on file  Transportation Needs: Not on file  Physical Activity: Not on file  Stress: Not on file  Social Connections: Not on file  Intimate Partner Violence: Not on file   Family History:  Family History  Problem Relation Age of Onset   Osteoporosis Mother    Diabetes Father     Review of Systems: Constitutional: Doesn't report fevers, chills or abnormal weight loss Eyes: Doesn't report blurriness of vision Ears, nose, mouth, throat, and face: Doesn't report sore throat Respiratory: Doesn't report cough, dyspnea or  wheezes Cardiovascular: Doesn't report palpitation, chest discomfort  Gastrointestinal:  Doesn't report nausea, constipation, diarrhea GU: Doesn't report incontinence Skin: Doesn't report skin rashes Neurological: Per HPI Musculoskeletal: Doesn't report joint pain Behavioral/Psych: Doesn't report anxiety  Physical Exam: Vitals:   07/06/21 1101  BP: (!) 146/84  Pulse: 72  Resp: 17  Temp: 97.7 F (36.5 C)  SpO2: 100%  KPS: 80. General: Alert, cooperative, pleasant, in no acute distress Head: Normal EENT: No conjunctival injection or scleral icterus.  Lungs: Resp effort normal Cardiac: Regular rate Abdomen: Non-distended abdomen Skin: No rashes cyanosis or petechiae. Extremities: No clubbing or edema  Neurologic Exam: Mental Status: Awake, alert, attentive to examiner. Oriented to self and environment. Language is fluent with intact comprehension.  Cranial Nerves: Visual acuity is grossly normal. Visual fields are full. Extra-ocular movements intact. No ptosis. Face is symmetric Motor: Tone and bulk are normal. Power is full in both arms and legs. Reflexes are symmetric, no pathologic reflexes present.  Sensory: Intact to light touch Gait: Normal.   Labs: I have reviewed the data as listed    Component Value Date/Time   NA 140 06/23/2021 0758   NA 143 07/14/2017 1245   K 3.8 06/23/2021 0758   K 3.3 (L) 07/14/2017 1245   CL 105 06/23/2021 0758   CO2 23 06/23/2021 0758   CO2 20 (L) 07/14/2017 1245   GLUCOSE 86 06/23/2021 0758   GLUCOSE 106 07/14/2017 1245   BUN 19 06/23/2021 0758   BUN 9.3 07/14/2017 1245   CREATININE 1.18 (H) 06/23/2021 0758   CREATININE 1.01 (H) 01/27/2021 0847   CREATININE 0.8 07/14/2017 1245   CALCIUM 9.6 06/23/2021 0758   CALCIUM 8.4 07/14/2017 1245   PROT 7.5 06/23/2021 0758   PROT 6.0 07/14/2017 1245   PROT 6.2 (L) 07/14/2017 1245   ALBUMIN 4.4 06/23/2021 0758   ALBUMIN 3.6 07/14/2017 1245   AST 16 06/23/2021 0758   AST 21 01/27/2021  0847   AST 21 07/14/2017 1245   ALT 11 06/23/2021 0758   ALT 14 01/27/2021 0847   ALT 21 07/14/2017 1245   ALKPHOS 68 06/23/2021 0758   ALKPHOS 78 07/14/2017 1245   BILITOT 0.9 06/23/2021 0758   BILITOT 0.6 01/27/2021 0847   BILITOT 0.43 07/14/2017 1245   GFRNONAA 49 (L) 06/23/2021 0758   GFRNONAA 60 (L) 01/27/2021 0847   GFRAA 57 (L) 04/21/2020 0817   GFRAA >60 01/14/2020 0828   Lab Results  Component Value Date   WBC 4.6 06/23/2021   NEUTROABS 0.8 (L) 06/23/2021   HGB 11.6 (L) 06/23/2021   HCT 35.6 (L) 06/23/2021   MCV 85.8 06/23/2021   PLT 276 06/23/2021     Assessment/Plan Postural dizziness - Plan: MR ANGIO HEAD WO W CONTRAST, MR ANGIO NECK W WO CONTRAST  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  This would be consistent with her reported postural symptoms and postural hypotension, normal cardiac workup.  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  This could certainly impact autonomic nervous system as well.  Because of her vertigo, visual impairment, history of locally treated plasmacytoma we would like to check CNS parenchyma as well to rule out neoplasm.  CT head obtained last month may not be sufficient to observe recurrent process.    Because of postural symptoms, reasonable concern for neurovascular process, we will also check an MRA head and neck along with the traditional MRI study.     We provided her with exercises to improve CNS perfusion with position changes.  She will begin working on these to ameliorate symptoms.  We will defer pharmacologic interventions such as fludrocortisone.  We spent twenty additional minutes  teaching regarding the natural history, biology, and historical experience in the  treatment of neurologic complications of cancer.   We appreciate the opportunity to participate in the care of Jessica [name].  She will return to clinic following MRI studies.  All questions were answered. The patient knows to call the clinic with any problems, questions or concerns. No barriers to learning were detected.  The total time spent in the encounter was 40 minutes and more than 50% was on counseling and review of test results   Ventura Sellers, MD Medical Director of Neuro-Oncology [facility] [place] 07/06/21 12:13 PM

## 2021-07-08 ENCOUNTER — Other Ambulatory Visit: Payer: Self-pay | Admitting: *Deleted

## 2021-07-08 DIAGNOSIS — C9002 Multiple myeloma in relapse: Secondary | ICD-10-CM

## 2021-07-08 MED ORDER — POMALIDOMIDE 2 MG PO CAPS: ORAL_CAPSULE | ORAL | 0 refills | Status: DC

## 2021-07-09 ENCOUNTER — Other Ambulatory Visit: Payer: Self-pay | Admitting: Radiation Therapy

## 2021-07-20 ENCOUNTER — Ambulatory Visit (HOSPITAL_COMMUNITY)
Admission: RE | Admit: 2021-07-20 | Discharge: 2021-07-20 | Disposition: A | Discharge status: Home / Self Care | Payer: Medicare Other | Source: Ambulatory Visit | Attending: Internal Medicine | Admitting: Internal Medicine

## 2021-07-20 ENCOUNTER — Other Ambulatory Visit: Payer: Self-pay | Admitting: Internal Medicine

## 2021-07-20 ENCOUNTER — Other Ambulatory Visit: Payer: Self-pay

## 2021-07-20 DIAGNOSIS — I671 Cerebral aneurysm, nonruptured: Secondary | ICD-10-CM | POA: Insufficient documentation

## 2021-07-20 DIAGNOSIS — R42 Dizziness and giddiness: Secondary | ICD-10-CM

## 2021-07-20 DIAGNOSIS — C9030 Solitary plasmacytoma not having achieved remission: Secondary | ICD-10-CM

## 2021-07-20 MED ORDER — GADOBUTROL 1 MMOL/ML IV SOLN
6.0000 mL | Freq: Once | INTRAVENOUS | Status: AC | PRN
  Administered 2021-07-20: 6 mL via INTRAVENOUS

## 2021-07-21 ENCOUNTER — Inpatient Hospital Stay (HOSPITAL_BASED_OUTPATIENT_CLINIC_OR_DEPARTMENT_OTHER): Payer: Medicare Other | Admitting: Hematology and Oncology

## 2021-07-21 ENCOUNTER — Inpatient Hospital Stay: Payer: Medicare Other | Attending: Hematology and Oncology

## 2021-07-21 ENCOUNTER — Encounter: Payer: Self-pay | Admitting: Hematology and Oncology

## 2021-07-21 ENCOUNTER — Inpatient Hospital Stay: Payer: Medicare Other

## 2021-07-21 DIAGNOSIS — Z887 Allergy status to serum and vaccine status: Secondary | ICD-10-CM | POA: Insufficient documentation

## 2021-07-21 DIAGNOSIS — Z5112 Encounter for antineoplastic immunotherapy: Secondary | ICD-10-CM | POA: Insufficient documentation

## 2021-07-21 DIAGNOSIS — Z886 Allergy status to analgesic agent status: Secondary | ICD-10-CM | POA: Diagnosis not present

## 2021-07-21 DIAGNOSIS — C9002 Multiple myeloma in relapse: Secondary | ICD-10-CM | POA: Diagnosis not present

## 2021-07-21 DIAGNOSIS — D61818 Other pancytopenia: Secondary | ICD-10-CM | POA: Diagnosis not present

## 2021-07-21 DIAGNOSIS — Z9484 Stem cells transplant status: Secondary | ICD-10-CM | POA: Insufficient documentation

## 2021-07-21 DIAGNOSIS — C9001 Multiple myeloma in remission: Secondary | ICD-10-CM

## 2021-07-21 DIAGNOSIS — Z880 Allergy status to penicillin: Secondary | ICD-10-CM | POA: Diagnosis not present

## 2021-07-21 DIAGNOSIS — Z8262 Family history of osteoporosis: Secondary | ICD-10-CM | POA: Diagnosis not present

## 2021-07-21 DIAGNOSIS — T451X5A Adverse effect of antineoplastic and immunosuppressive drugs, initial encounter: Secondary | ICD-10-CM | POA: Diagnosis not present

## 2021-07-21 DIAGNOSIS — Z923 Personal history of irradiation: Secondary | ICD-10-CM | POA: Diagnosis not present

## 2021-07-21 DIAGNOSIS — Z885 Allergy status to narcotic agent status: Secondary | ICD-10-CM | POA: Diagnosis not present

## 2021-07-21 DIAGNOSIS — R42 Dizziness and giddiness: Secondary | ICD-10-CM

## 2021-07-21 DIAGNOSIS — I6782 Cerebral ischemia: Secondary | ICD-10-CM | POA: Insufficient documentation

## 2021-07-21 DIAGNOSIS — Z881 Allergy status to other antibiotic agents status: Secondary | ICD-10-CM | POA: Diagnosis not present

## 2021-07-21 DIAGNOSIS — D709 Neutropenia, unspecified: Secondary | ICD-10-CM | POA: Insufficient documentation

## 2021-07-21 DIAGNOSIS — I671 Cerebral aneurysm, nonruptured: Secondary | ICD-10-CM | POA: Insufficient documentation

## 2021-07-21 DIAGNOSIS — J984 Other disorders of lung: Secondary | ICD-10-CM | POA: Diagnosis not present

## 2021-07-21 DIAGNOSIS — Z79899 Other long term (current) drug therapy: Secondary | ICD-10-CM | POA: Insufficient documentation

## 2021-07-21 DIAGNOSIS — G620 Drug-induced polyneuropathy: Secondary | ICD-10-CM | POA: Insufficient documentation

## 2021-07-21 DIAGNOSIS — Z833 Family history of diabetes mellitus: Secondary | ICD-10-CM | POA: Diagnosis not present

## 2021-07-21 DIAGNOSIS — Z7189 Other specified counseling: Secondary | ICD-10-CM

## 2021-07-21 LAB — COMPREHENSIVE METABOLIC PANEL
ALT: 9 U/L (ref 0–44)
AST: 14 U/L — ABNORMAL LOW (ref 15–41)
Albumin: 3.8 g/dL (ref 3.5–5.0)
Alkaline Phosphatase: 58 U/L (ref 38–126)
Anion gap: 9 (ref 5–15)
BUN: 11 mg/dL (ref 8–23)
CO2: 24 mmol/L (ref 22–32)
Calcium: 8.8 mg/dL — ABNORMAL LOW (ref 8.9–10.3)
Chloride: 109 mmol/L (ref 98–111)
Creatinine, Ser: 0.86 mg/dL (ref 0.44–1.00)
GFR, Estimated: >60 mL/min (ref >60)
Glucose, Bld: 84 mg/dL (ref 70–99)
Potassium: 3.1 mmol/L — ABNORMAL LOW (ref 3.5–5.1)
Sodium: 142 mmol/L (ref 135–145)
Total Bilirubin: 0.8 mg/dL (ref 0.3–1.2)
Total Protein: 6.2 g/dL — ABNORMAL LOW (ref 6.5–8.1)

## 2021-07-21 LAB — CBC WITH DIFFERENTIAL/PLATELET
Abs Immature Granulocytes: 0.04 10*3/uL (ref 0.00–0.07)
Basophils Absolute: 0.2 10*3/uL — ABNORMAL HIGH (ref 0.0–0.1)
Basophils Relative: 5 %
Eosinophils Absolute: 0.5 10*3/uL (ref 0.0–0.5)
Eosinophils Relative: 15 %
HCT: 30.9 % — ABNORMAL LOW (ref 36.0–46.0)
Hemoglobin: 10 g/dL — ABNORMAL LOW (ref 12.0–15.0)
Immature Granulocytes: 1 %
Lymphocytes Relative: 42 %
Lymphs Abs: 1.5 10*3/uL (ref 0.7–4.0)
MCH: 28.6 pg (ref 26.0–34.0)
MCHC: 32.4 g/dL (ref 30.0–36.0)
MCV: 88.3 fL (ref 80.0–100.0)
Monocytes Absolute: 0.2 10*3/uL (ref 0.1–1.0)
Monocytes Relative: 6 %
Neutro Abs: 1.1 10*3/uL — ABNORMAL LOW (ref 1.7–7.7)
Neutrophils Relative %: 31 %
Platelets: 438 10*3/uL — ABNORMAL HIGH (ref 150–400)
RBC: 3.5 MIL/uL — ABNORMAL LOW (ref 3.87–5.11)
RDW: 17.7 % — ABNORMAL HIGH (ref 11.5–15.5)
WBC: 3.4 10*3/uL — ABNORMAL LOW (ref 4.0–10.5)
nRBC: 0 % (ref 0.0–0.2)

## 2021-07-21 MED ORDER — DEXAMETHASONE 4 MG PO TABS
8.0000 mg | ORAL_TABLET | Freq: Once | ORAL | Status: AC
  Administered 2021-07-21: 8 mg via ORAL
  Filled 2021-07-21: qty 2

## 2021-07-21 MED ORDER — ACETAMINOPHEN 325 MG PO TABS
650.0000 mg | ORAL_TABLET | Freq: Once | ORAL | Status: AC
  Administered 2021-07-21: 650 mg via ORAL
  Filled 2021-07-21: qty 2

## 2021-07-21 MED ORDER — MONTELUKAST SODIUM 10 MG PO TABS
10.0000 mg | ORAL_TABLET | Freq: Once | ORAL | Status: AC
  Administered 2021-07-21: 10 mg via ORAL
  Filled 2021-07-21: qty 1

## 2021-07-21 MED ORDER — DARATUMUMAB-HYALURONIDASE-FIHJ 1800-30000 MG-UT/15ML ~~LOC~~ SOLN
1800.0000 mg | Freq: Once | SUBCUTANEOUS | Status: AC
  Administered 2021-07-21: 1800 mg via SUBCUTANEOUS
  Filled 2021-07-21: qty 15

## 2021-07-21 NOTE — Assessment & Plan Note (Signed)
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

## 2021-07-21 NOTE — Progress Notes (Signed)
°  Ok to treat today with ANC 1.1 per Dr Alvy Bimler.

## 2021-07-21 NOTE — Assessment & Plan Note (Signed)
The cause of her chronic dizziness is multifactorial Appreciated neuro oncology consult Multiple imaging studies were performed yesterday, results pending

## 2021-07-21 NOTE — Progress Notes (Signed)
[place] OFFICE PROGRESS NOTE  Patient Care Team: Josetta Huddle, MD as PCP - General (Internal Medicine) Buford Dresser, MD as PCP - Cardiology (Cardiology) Ginette Pitman, MD as Consulting Physician (Hematology and Oncology) Hessie Dibble, MD as Consulting Physician (Hematology and Oncology)  ASSESSMENT & PLAN:  Multiple myeloma in relapse [facility]) I have reviewed her recent myeloma panel She has positive response to therapy and tolerated treatment well, except for mild neutropenia Her treatment is not the cause of her recurrent falls or dizziness We will proceed with treatment without delay  Pancytopenia, acquired [facility]) She has chronic pancytopenia, 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   All questions were answered. The patient knows to call the clinic with any problems, questions or concerns. The total time spent in the appointment was 20 minutes encounter with patients including review of chart and various tests results, discussions about plan of care and coordination of care plan   [name], MD 07/21/2021 10:46 AM  INTERVAL HISTORY: Please see below for problem oriented charting. she returns for treatment follow-up on Pomalyst and daratumumab for recurrent multiple myeloma She was seen by neuro oncologist recently and multiple imaging studies were performed She had no recent fall No recent infection, fever or chills  REVIEW OF SYSTEMS:   Constitutional: Denies fevers, chills or abnormal weight loss Eyes: Denies blurriness of vision Ears, nose, mouth, throat, and face: Denies mucositis or sore throat Respiratory: Denies cough, dyspnea or  wheezes Cardiovascular: Denies palpitation, chest discomfort or lower extremity swelling Gastrointestinal:  Denies nausea, heartburn or change in bowel habits Skin: Denies abnormal skin rashes Lymphatics: Denies new lymphadenopathy or easy bruising Neurological:Denies numbness, tingling or new weaknesses Behavioral/Psych: Mood is stable, no new changes  All other systems were reviewed with the patient and are negative.  I have reviewed the past medical history, past surgical history, social history and family history with the patient and they are unchanged from previous note.  ALLERGIES:  is allergic to augmentin [amoxicillin-pot clavulanate], albuterol, codeine, ibuprofen, nsaids, tolmetin, and tetanus-diphth-acell pertussis.  MEDICATIONS:  Current Outpatient Medications  Medication Sig Dispense Refill   acetaminophen (TYLENOL) 500 MG tablet Take 500-1,000 mg by mouth every 6 (six) hours as needed for mild pain, moderate pain, fever or headache.     acyclovir (ZOVIRAX) 400 MG tablet TAKE 1 TABLET BY MOUTH TWICE DAILY 180 tablet 3   aspirin EC 81 MG tablet Take 81 mg by mouth daily with breakfast.     calcium carbonate (OSCAL) 1500 (600 Ca) MG TABS tablet Take 1,500 mg by mouth daily with breakfast.     Calcium Carbonate 500 MG CHEW Chew 500 mg by mouth as needed for indigestion. (Patient not taking: Reported on 07/06/2021)     Cholecalciferol (VITAMIN D3) 2000 units capsule Take 2,000 Units by mouth daily.      loratadine (CLARITIN) 10 MG tablet Take 10 mg by mouth daily.     Multiple Vitamins-Minerals (CENTRUM SILVER PO) Take 1 tablet by mouth daily.      omeprazole (PRILOSEC) 20 MG capsule TAKE 1 CAPSULE(20 MG) BY MOUTH DAILY 90 capsule 11   ondansetron (ZOFRAN) 8 MG tablet TAKE 1 TABLET(8 MG) BY MOUTH EVERY 8 HOURS AS NEEDED FOR  NAUSEA OR VOMITING 60 tablet 11   pomalidomide (POMALYST) 2 MG capsule Take 1 pill daily for 14 days then rest 7 days for cycle of every 21 days 14 capsule 0    senna-docusate (SENOKOT-S) 8.6-50 MG tablet Take 1-2 tablets by mouth daily as needed for mild constipation or moderate constipation.  (Patient not taking: Reported on 07/06/2021)     sodium chloride (OCEAN) 0.65 % SOLN nasal spray Place 1 spray into both nostrils as needed for congestion.     vitamin B-12 (CYANOCOBALAMIN) 500 MCG tablet Take 500 mcg by mouth daily.     No current facility-administered medications for this visit.   Facility-Administered Medications Ordered in Other Visits  Medication Dose Route Frequency Provider Last Rate Last Admin   0.9 %  sodium chloride infusion   Intravenous Once Alvy Bimler, Shermeka Rutt, MD       daratumumab-hyaluronidase-fihj (DARZALEX FASPRO) 1800-30000 MG-UT/15ML chemo SQ injection 1,800 mg  1,800 mg Subcutaneous Once Alvy Bimler, Deforest Maiden, MD        SUMMARY OF ONCOLOGIC HISTORY: Oncology History Overview Note   M-protein 0.69 gm/dl IFIX - IgG, Kappa IgG - 868 IgA - 19 IgM - < 20 Kappa - 21 Lambda - 5.7  09/06/2014 - Bone marrow aspirate and biopsy:   Normocellular marrow for age (40%) with a small monoclonal plasma cell population (1% on aspirate). Karyotype 22, XX  FISH Negative for myeloma associated changes  09/12/2014 - PET/CT  Two regions that are concerning for disease, one adjacent/involving the left ninth rib and one in the marrow of the right femur, in this patient with history of plasmacytoma.    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   04/01/2015 Bone Marrow Transplant   She received melphalan chemotherapy on 03/31/2015 followed by autologous stem cell transplant the day after   04/03/2015 - 04/18/2015 Hospital Admission   The patient was admitted to the hospital at [facility] for management related to complication from stem cell transplant. She had significant nausea requiring intravenous anti-emetics.   07/17/2015 -  Chemotherapy   She started maintenance Revlimid and monthly zometa, then every 3 months   06/01/2018 Imaging   DEXA scan showed bone density T score in femur -2.3   04/20/2019 PET scan   1. No FDG avid osseous lesions or mass identified to suggest metabolically active lesion of myeloma or plasmacytoma. 2. Small nodular density within the paravertebral right lower lobe exhibits mild to moderate increased uptake within  SUV max of 3.38. This is indeterminate. Review of CT chest from 10/05/2018 shows a corresponding Lung nodule in this area measuring the same. Small pulmonary neoplasm cannot be excluded. 3. Indeterminate, focal area of increased uptake is identified within the thoracic canal. Indeterminate favored to represent benign physiologic CNS activity.     04/30/2019 -  Chemotherapy   Patient is on Treatment Plan : MYELOMA SQ Daratumumab Faspro q28d     04/30/2019 - 07/09/2019 Chemotherapy   The patient had bortezomib SQ (VELCADE) chemo injection 1.75 mg, 1.3 mg/m2 = 1.75 mg, Subcutaneous,  Once, 10 of 11 cycles Administration: 1.75 mg (04/30/2019), 1.75 mg (05/07/2019), 1.75 mg  (05/14/2019), 1.75 mg (05/21/2019), 1.75 mg (05/28/2019), 1.75 mg (06/04/2019), 1.75 mg (06/11/2019), 1.75 mg (06/18/2019), 1.75 mg (06/25/2019), 1.75 mg (07/09/2019)   for chemotherapy treatment.       PHYSICAL EXAMINATION: ECOG PERFORMANCE STATUS: 1 - Symptomatic but completely ambulatory  Vitals:   07/21/21 0922  BP: (!) 149/92  Pulse: 73  Resp: 18  Temp: (!) 97.4 F (36.3 C)  SpO2: 100%   Filed Weights   07/21/21 0922  Weight: 115 lb 12.8 oz (52.5 kg)    GENERAL:alert, no distress and comfortable SKIN: skin color, texture, turgor are normal, no rashes or significant lesions EYES: normal, Conjunctiva are pink and non-injected, sclera clear OROPHARYNX:no exudate, no erythema and lips, buccal mucosa, and tongue normal  NECK: supple, thyroid normal size, non-tender, without nodularity LYMPH:  no palpable lymphadenopathy in the cervical, axillary or inguinal LUNGS: clear to auscultation and percussion with normal breathing effort HEART: regular rate & rhythm and no murmurs and no lower extremity edema ABDOMEN:abdomen soft, non-tender and normal bowel sounds Musculoskeletal:no cyanosis of digits and no clubbing  NEURO: alert & oriented x 3 with fluent speech, no focal motor/sensory deficits  LABORATORY DATA:  I have reviewed the data as listed    Component Value Date/Time   NA 142 07/21/2021 0904   NA 143 07/14/2017 1245   K 3.1 (L) 07/21/2021 0904   K 3.3 (L) 07/14/2017 1245   CL 109 07/21/2021 0904   CO2 24 07/21/2021 0904   CO2 20 (L) 07/14/2017 1245   GLUCOSE 84 07/21/2021 0904   GLUCOSE 106 07/14/2017 1245   BUN 11 07/21/2021 0904   BUN 9.3 07/14/2017 1245   CREATININE 0.86 07/21/2021 0904   CREATININE 1.01 (H) 01/27/2021 0847   CREATININE 0.8 07/14/2017 1245   CALCIUM 8.8 (L) 07/21/2021 0904   CALCIUM 8.4 07/14/2017 1245   PROT 6.2 (L) 07/21/2021 0904   PROT 6.0 07/14/2017 1245   PROT 6.2 (L) 07/14/2017 1245   ALBUMIN 3.8 07/21/2021 0904   ALBUMIN 3.6  07/14/2017 1245   AST 14 (L) 07/21/2021 0904   AST 21 01/27/2021 0847   AST 21 07/14/2017 1245   ALT 9 07/21/2021 0904   ALT 14 01/27/2021 0847   ALT 21 07/14/2017 1245   ALKPHOS 58 07/21/2021 0904   ALKPHOS 78 07/14/2017 1245   BILITOT 0.8 07/21/2021 0904   BILITOT 0.6 01/27/2021 0847   BILITOT 0.43 07/14/2017 1245   GFRNONAA >60 07/21/2021 0904   GFRNONAA 60 (L) 01/27/2021 0847   GFRAA 57 (L) 04/21/2020 0817   GFRAA >60 01/14/2020 0828    No results found for: SPEP, UPEP  Lab Results  Component Value Date   WBC 3.4 (L) 07/21/2021   NEUTROABS 1.1 (L) 07/21/2021   HGB 10.0 (L) 07/21/2021   HCT 30.9 (L) 07/21/2021   MCV 88.3 07/21/2021  PLT 438 (H) 07/21/2021      Chemistry      Component Value Date/Time   NA 142 07/21/2021 0904   NA 143 07/14/2017 1245   K 3.1 (L) 07/21/2021 0904   K 3.3 (L) 07/14/2017 1245   CL 109 07/21/2021 0904   CO2 24 07/21/2021 0904   CO2 20 (L) 07/14/2017 1245   BUN 11 07/21/2021 0904   BUN 9.3 07/14/2017 1245   CREATININE 0.86 07/21/2021 0904   CREATININE 1.01 (H) 01/27/2021 0847   CREATININE 0.8 07/14/2017 1245      Component Value Date/Time   CALCIUM 8.8 (L) 07/21/2021 0904   CALCIUM 8.4 07/14/2017 1245   ALKPHOS 58 07/21/2021 0904   ALKPHOS 78 07/14/2017 1245   AST 14 (L) 07/21/2021 0904   AST 21 01/27/2021 0847   AST 21 07/14/2017 1245   ALT 9 07/21/2021 0904   ALT 14 01/27/2021 0847   ALT 21 07/14/2017 1245   BILITOT 0.8 07/21/2021 0904   BILITOT 0.6 01/27/2021 0847   BILITOT 0.43 07/14/2017 1245

## 2021-07-21 NOTE — Assessment & Plan Note (Signed)
I have reviewed her recent myeloma panel She has positive response to therapy and tolerated treatment well, except for mild neutropenia Her treatment is not the cause of her recurrent falls or dizziness We will proceed with treatment without delay

## 2021-07-22 LAB — KAPPA/LAMBDA LIGHT CHAINS
Kappa free light chain: 18.5 mg/L (ref 3.3–19.4)
Kappa, lambda light chain ratio: 4.63 — ABNORMAL HIGH (ref 0.26–1.65)
Lambda free light chains: 4 mg/L — ABNORMAL LOW (ref 5.7–26.3)

## 2021-07-23 ENCOUNTER — Other Ambulatory Visit: Payer: Self-pay | Admitting: Internal Medicine

## 2021-07-23 DIAGNOSIS — C9030 Solitary plasmacytoma not having achieved remission: Secondary | ICD-10-CM

## 2021-07-23 DIAGNOSIS — R42 Dizziness and giddiness: Secondary | ICD-10-CM

## 2021-07-27 ENCOUNTER — Other Ambulatory Visit: Payer: Self-pay

## 2021-07-27 ENCOUNTER — Inpatient Hospital Stay (HOSPITAL_BASED_OUTPATIENT_CLINIC_OR_DEPARTMENT_OTHER): Payer: Medicare Other | Admitting: Internal Medicine

## 2021-07-27 ENCOUNTER — Inpatient Hospital Stay: Payer: Medicare Other

## 2021-07-27 VITALS — BP 148/94 | HR 83 | Temp 97.6°F | Resp 16 | Ht 60.75 in | Wt 112.7 lb

## 2021-07-27 DIAGNOSIS — T451X5A Adverse effect of antineoplastic and immunosuppressive drugs, initial encounter: Secondary | ICD-10-CM

## 2021-07-27 DIAGNOSIS — Z5112 Encounter for antineoplastic immunotherapy: Secondary | ICD-10-CM | POA: Diagnosis not present

## 2021-07-27 DIAGNOSIS — G620 Drug-induced polyneuropathy: Secondary | ICD-10-CM | POA: Diagnosis not present

## 2021-07-27 MED ORDER — FLUDROCORTISONE ACETATE 0.1 MG PO TABS: 0.0500 mg | ORAL_TABLET | Freq: Every day | ORAL | 1 refills | Status: DC

## 2021-07-27 NOTE — Progress Notes (Signed)
[facility] at [place] [place], [place] [address] [phone]   Interval Evaluation  Date of Service: 07/27/21 Patient Name: Jessica [name] Patient MRN: [id number] Patient DOB: June 20, 1951 Provider: Ventura Sellers, MD  Identifying Statement:  Jessica [name] is a 70 y.o. female with Chemotherapy-induced neuropathy ([place])   Primary Cancer:  Oncologic History: Oncology History Overview Note   M-protein 0.69 gm/dl IFIX - IgG, Kappa IgG - 868 IgA - 19 IgM - < 20 Kappa - 21 Lambda - 5.7  09/06/2014 - Bone marrow aspirate and biopsy:   Normocellular marrow for age (40%) with a small monoclonal plasma cell population (1% on aspirate). Karyotype 71, XX  FISH Negative for myeloma associated changes  09/12/2014 - PET/CT  Two regions that are concerning for disease, one adjacent/involving the left ninth rib and one in the marrow of the right femur, in this patient with history of plasmacytoma.    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   04/01/2015 Bone Marrow Transplant   She received melphalan chemotherapy on 03/31/2015 followed by autologous stem cell transplant the day after   04/03/2015 - 04/18/2015 Hospital Admission   The patient was admitted to the hospital at [facility] for management related to complication from stem cell transplant. She had significant nausea requiring intravenous anti-emetics.   07/17/2015 -  Chemotherapy   She started maintenance Revlimid and monthly zometa, then every 3 months   06/01/2018 Imaging   DEXA scan showed bone density T score in femur -2.3   04/20/2019 PET scan   1. No FDG avid osseous lesions or mass identified to suggest metabolically active lesion of myeloma or plasmacytoma. 2. Small nodular density within the paravertebral right lower lobe exhibits mild to moderate increased uptake within SUV max of 3.38. This is indeterminate. Review of CT chest from 10/05/2018 shows a corresponding Lung nodule in this area measuring the same. Small pulmonary neoplasm cannot be excluded. 3. Indeterminate, focal area of increased uptake is identified within the thoracic canal. Indeterminate favored to represent benign physiologic CNS activity.     04/30/2019 -  Chemotherapy   Patient is on Treatment Plan : MYELOMA SQ Daratumumab Faspro q28d     04/30/2019 - 07/09/2019 Chemotherapy   The patient had bortezomib SQ (VELCADE) chemo injection 1.75 mg, 1.3 mg/m2 = 1.75 mg, Subcutaneous,  Once, 10 of 11 cycles Administration: 1.75 mg (04/30/2019), 1.75 mg (05/07/2019), 1.75 mg (  05/14/2019), 1.75 mg (05/21/2019), 1.75 mg (05/28/2019), 1.75 mg (06/04/2019), 1.75 mg (06/11/2019), 1.75 mg (06/18/2019), 1.75 mg (06/25/2019), 1.75 mg (07/09/2019)   for chemotherapy treatment.       Interval History: Jessica [name] presents today for follow up after recent testing.  She describes no improvement in postural light-headedness since instituting interventions we recommended (head of  bed elevation, hand clenching prior to rising, crossing legs while standing).  The dizziness continues to interfere significantly with her day to day function.  Continues on myeloma infusions with Dr. Alvy Bimler as prior.     H+P (07/06/21) Patient presents today for evaluation of recent postural dizziness.  She describes "dizziness in my head" that occurs every time upon standing from seated position, symptoms last for seconds to minutes before almost subsiding.  Duration has been at least 3 months.  Per husband, she has "passed out briefly" at least 3 times.  Thorough cardiac workup has been performed by Dr. Harrell Gave, without any abnormality.  She also describes numbness in her toes for the past "year or two".  Her right eye vision is impaired chronically following brain plasmacytoma surgery and radiation in 2016, it is somewhat worse now however.  She otherwise is on observation for myeloma with Dr. Alvy Bimler.    Medications: Current Outpatient Medications on File Prior to Visit  Medication Sig Dispense Refill   acetaminophen (TYLENOL) 500 MG tablet Take 500-1,000 mg by mouth every 6 (six) hours as needed for mild pain, moderate pain, fever or headache.     acyclovir (ZOVIRAX) 400 MG tablet TAKE 1 TABLET BY MOUTH TWICE DAILY 180 tablet 3   aspirin EC 81 MG tablet Take 81 mg by mouth daily with breakfast.     calcium carbonate (OSCAL) 1500 (600 Ca) MG TABS tablet Take 1,500 mg by mouth daily with breakfast.     Cholecalciferol (VITAMIN D3) 2000 units capsule Take 2,000 Units by mouth daily.      loratadine (CLARITIN) 10 MG tablet Take 10 mg by mouth daily.     Multiple Vitamins-Minerals (CENTRUM SILVER PO) Take 1 tablet by mouth daily.      omeprazole (PRILOSEC) 20 MG capsule TAKE 1 CAPSULE(20 MG) BY MOUTH DAILY 90 capsule 11   ondansetron (ZOFRAN) 8 MG tablet TAKE 1 TABLET(8 MG) BY MOUTH EVERY 8 HOURS AS NEEDED FOR NAUSEA OR VOMITING 60 tablet 11   pomalidomide (POMALYST) 2 MG capsule Take 1 pill  daily for 14 days then rest 7 days for cycle of every 21 days 14 capsule 0   senna-docusate (SENOKOT-S) 8.6-50 MG tablet Take 1-2 tablets by mouth daily as needed for mild constipation or moderate constipation.     sodium chloride (OCEAN) 0.65 % SOLN nasal spray Place 1 spray into both nostrils as needed for congestion.     vitamin B-12 (CYANOCOBALAMIN) 500 MCG tablet Take 500 mcg by mouth daily.     Calcium Carbonate 500 MG CHEW Chew 500 mg by mouth as needed for indigestion. (Patient not taking: Reported on 07/06/2021)     Current Facility-Administered Medications on File Prior to Visit  Medication Dose Route Frequency Provider Last Rate Last Admin   0.9 %  sodium chloride infusion   Intravenous Once [name], MD        Allergies:  Allergies  Allergen Reactions   Augmentin [Amoxicillin-Pot Clavulanate] Hives, Itching, Nausea And Vomiting and Rash    ++Tolerates Cefepime++ Has patient had a PCN reaction causing immediate rash, facial/tongue/throat swelling, SOB or lightheadedness with hypotension:  Yes Has patient had a PCN reaction causing severe rash involving mucus membranes or skin necrosis: Yes Has patient had a PCN reaction that required hospitalization No Has patient had a PCN reaction occurring within the last 10 years: Yes If all of the above answers are "NO", then may proceed with Cephalosporin use. *okay to take other types of cillin   Albuterol Other (See Comments)    Elevated HR and BP   Codeine Nausea Only   Ibuprofen Nausea Only   Nsaids Other (See Comments)    Gi Upset   Tolmetin Other (See Comments)    Gi Upset   Tetanus-Diphth-Acell Pertussis Rash    Received injection in ER on 8/1, developed fine rash all over body. Cellulitis to injection site on left deltoid.   Past Medical History:  Past Medical History:  Diagnosis Date   H/O stem cell transplant [facility]) 03/2015   [facility]   Multiple myeloma [facility]) 11/19/2014   Multiple myeloma ([facility])    Multiple  myeloma ([facility])    MVP (mitral valve prolapse)    req prophylaxis   Other constipation 12/03/2014   Right ovarian cyst 10/16/15   16 mm simple cyst - ultrasound yearly.   Upper respiratory infection, acute 08/12/2015   Past Surgical History:  Past Surgical History:  Procedure Laterality Date   CATARACT EXTRACTION, BILATERAL  10/2010   Implants(ReSTOR) Bilat   LASER ABLATION OF CONDYLOMAS  1990   CIN 1 cervix   multiple myeloma surgery Right 08/2014   [facility]   Social History:  Social History   Socioeconomic History   Marital status: Married    Spouse name: Not on file   Number of children: Not on file   Years of education: Not on file   Highest education level: Not on file  Occupational History   Not on file  Tobacco Use   Smoking status: Never   Smokeless tobacco: Never  Vaping Use   Vaping Use: Never used  Substance and Sexual Activity   Alcohol use: No    Alcohol/week: 0.0 standard drinks   Drug use: No   Sexual activity: Not Currently    Partners: Male    Birth control/protection: Post-menopausal    Comment: vasectomy  Other Topics Concern   Not on file  Social History Narrative   Not on file   Social Determinants of Health   Financial Resource Strain: Not on file  Food Insecurity: Not on file  Transportation Needs: Not on file  Physical Activity: Not on file  Stress: Not on file  Social Connections: Not on file  Intimate Partner Violence: Not on file   Family History:  Family History  Problem Relation Age of Onset   Osteoporosis Mother    Diabetes Father     Review of Systems: Constitutional: Doesn't report fevers, chills or abnormal weight loss Eyes: Doesn't report blurriness of vision Ears, nose, mouth, throat, and face: Doesn't report sore throat Respiratory: Doesn't report cough, dyspnea or wheezes Cardiovascular: Doesn't report palpitation, chest discomfort  Gastrointestinal:  Doesn't report nausea, constipation, diarrhea GU: Doesn't  report incontinence Skin: Doesn't report skin rashes Neurological: Per HPI Musculoskeletal: Doesn't report joint pain Behavioral/Psych: Doesn't report anxiety  Physical Exam: Vitals:   07/27/21 1033  BP: (!) 148/94  Pulse: 83  Resp: 16  Temp: 97.6 F (36.4 C)  SpO2: 98%   KPS: 80. General: Alert, cooperative, pleasant, in no acute distress Head: Normal EENT: No conjunctival injection or scleral icterus.  Lungs: Resp  effort normal Cardiac: Regular rate Abdomen: Non-distended abdomen Skin: No rashes cyanosis or petechiae. Extremities: No clubbing or edema  Neurologic Exam: Mental Status: Awake, alert, attentive to examiner. Oriented to self and environment. Language is fluent with intact comprehension.  Cranial Nerves: Visual acuity is grossly normal. Visual fields are full. Extra-ocular movements intact. No ptosis. Face is symmetric Motor: Tone and bulk are normal. Power is full in both arms and legs. Reflexes are symmetric, no pathologic reflexes present.  Sensory: Intact to light touch Gait: Normal.   Labs: I have reviewed the data as listed    Component Value Date/Time   NA 142 07/21/2021 0904   NA 143 07/14/2017 1245   K 3.1 (L) 07/21/2021 0904   K 3.3 (L) 07/14/2017 1245   CL 109 07/21/2021 0904   CO2 24 07/21/2021 0904   CO2 20 (L) 07/14/2017 1245   GLUCOSE 84 07/21/2021 0904   GLUCOSE 106 07/14/2017 1245   BUN 11 07/21/2021 0904   BUN 9.3 07/14/2017 1245   CREATININE 0.86 07/21/2021 0904   CREATININE 1.01 (H) 01/27/2021 0847   CREATININE 0.8 07/14/2017 1245   CALCIUM 8.8 (L) 07/21/2021 0904   CALCIUM 8.4 07/14/2017 1245   PROT 6.2 (L) 07/21/2021 0904   PROT 6.0 07/14/2017 1245   PROT 6.2 (L) 07/14/2017 1245   ALBUMIN 3.8 07/21/2021 0904   ALBUMIN 3.6 07/14/2017 1245   AST 14 (L) 07/21/2021 0904   AST 21 01/27/2021 0847   AST 21 07/14/2017 1245   ALT 9 07/21/2021 0904   ALT 14 01/27/2021 0847   ALT 21 07/14/2017 1245   ALKPHOS 58 07/21/2021 0904    ALKPHOS 78 07/14/2017 1245   BILITOT 0.8 07/21/2021 0904   BILITOT 0.6 01/27/2021 0847   BILITOT 0.43 07/14/2017 1245   GFRNONAA >60 07/21/2021 0904   GFRNONAA 60 (L) 01/27/2021 0847   GFRAA 57 (L) 04/21/2020 0817   GFRAA >60 01/14/2020 0828   Lab Results  Component Value Date   WBC 3.4 (L) 07/21/2021   NEUTROABS 1.1 (L) 07/21/2021   HGB 10.0 (L) 07/21/2021   HCT 30.9 (L) 07/21/2021   MCV 88.3 07/21/2021   PLT 438 (H) 07/21/2021    Imaging:   MR ANGIO HEAD WO CONTRAST  Result Date: 07/23/2021 CLINICAL DATA:  History of multiple myeloma. Acute neuro deficit. Rule out stroke. History of craniotomy EXAM: MRI HEAD WITHOUT AND WITH CONTRAST MRA HEAD WITHOUT CONTRAST MRA NECK WITHOUT AND WITH CONTRAST TECHNIQUE: Multiplanar, multiecho pulse sequences of the brain and surrounding structures were obtained without and with intravenous contrast. Angiographic images of the Circle of Willis were obtained using MRA technique without intravenous contrast. Angiographic images of the neck were obtained using MRA technique without and with intravenous contrast. Carotid stenosis measurements (when applicable) are obtained utilizing NASCET criteria, using the distal internal carotid diameter as the denominator. CONTRAST:  48m GADAVIST GADOBUTROL 1 MMOL/ML IV SOLN COMPARISON:  CT head 05/11/2021.  MRI head 06/10/2014 FINDINGS: MRI HEAD FINDINGS Brain: Initial images performed on 07/20/2021. The intravenous contrast was administered prior to completing the unenhanced portion of the study, therefore the patient returned for additional imaging on 07/23/2021. Negative for acute infarct. Chronic microvascular ischemic changes in the white matter and pons. Negative for hemorrhage. Postcontrast imaging reveals interval improvement in enhancing mass in the right cavernous sinus identified 2015. There is dural thickening and enhancement in the right middle cranial fossa with associated craniotomy changes. Dural  thickening also present in the right frontal region. These  findings are most likely postsurgical however continued follow-up is necessary to rule out dural tumor. Vascular: Normal arterial flow voids Skull and upper cervical spine: Right pterional craniotomy. There is enhancement in the region of the right pterion which is likely due to postsurgical change. Review of the bone windows on CT reveals missing bone in this area felt to be postsurgical. No other suspicious skull lesion. Sinuses/Orbits: Paranasal sinuses clear. Bilateral cataract extraction Other: None MRA HEAD FINDINGS The initial MRA was performed after intravenous contrast in therefore was repeated on 07/23/2021. Internal carotid artery patent bilaterally. 4 mm aneurysm right cavernous carotid. Anterior cerebral arteries patent bilaterally. Mild stenosis and irregularity the M1 segment bilaterally. Moderate stenosis of the M2 branches bilaterally. Both vertebral arteries patent to the basilar. Basilar widely patent. Fetal origin right posterior cerebral artery. Occlusion of the right posterior cerebral artery P2 segment. Mild stenosis left posterior cerebral artery. MRA NECK FINDINGS Postcontrast imaging was repeated on 12/15 due to suboptimal timing of the [place] attempt. Unfortunately, there is suboptimal arterial opacification of the second toe No significant carotid or vertebral artery stenosis. Limited evaluation IMPRESSION: 1. Negative for acute infarct. 2. Imaging was performed on 2 days due to suboptimal imaging on the first attempt. 3. Right craniotomy. Previously seen cavernous sinus mass on the right on MRI of 2015 has largely resolved. There remains dural thickening and enhancement in the right frontal and temporal lobe and middle cranial fossa presumably related to prior surgery. If there is concern of recurrent tumor, continued follow-up recommended. Enhancement in the right pterion is felt to be postoperative granulation tissue. 4. Mild  stenosis M1 segment bilaterally. Moderate stenosis M2 branches bilaterally. 5. Occlusion right posterior cerebral artery. Mild atherosclerotic disease left posterior cerebral artery. 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 7. Suboptimal MRA neck.  No large vessel occlusion. Electronically Signed   By: Franchot Gallo M.D.   On: 07/23/2021 18:02   MR ANGIO NECK W WO CONTRAST  Result Date: 07/23/2021 CLINICAL DATA:  History of multiple myeloma. Acute neuro deficit. Rule out stroke. History of craniotomy EXAM: MRI HEAD WITHOUT AND WITH CONTRAST MRA HEAD WITHOUT CONTRAST MRA NECK WITHOUT AND WITH CONTRAST TECHNIQUE: Multiplanar, multiecho pulse sequences of the brain and surrounding structures were obtained without and with intravenous contrast. Angiographic images of the Circle of Willis were obtained using MRA technique without intravenous contrast. Angiographic images of the neck were obtained using MRA technique without and with intravenous contrast. Carotid stenosis measurements (when applicable) are obtained utilizing NASCET criteria, using the distal internal carotid diameter as the denominator. CONTRAST:  81m GADAVIST GADOBUTROL 1 MMOL/ML IV SOLN COMPARISON:  CT head 05/11/2021.  MRI head 06/10/2014 FINDINGS: MRI HEAD FINDINGS Brain: Initial images performed on 07/20/2021. The intravenous contrast was administered prior to completing the unenhanced portion of the study, therefore the patient returned for additional imaging on 07/23/2021. Negative for acute infarct. Chronic microvascular ischemic changes in the white matter and pons. Negative for hemorrhage. Postcontrast imaging reveals interval improvement in enhancing mass in the right cavernous sinus identified 2015. There is dural thickening and enhancement in the right middle cranial fossa with associated craniotomy changes. Dural thickening also present in the right frontal region. These findings are most likely postsurgical however continued  follow-up is necessary to rule out dural tumor. Vascular: Normal arterial flow voids Skull and upper cervical spine: Right pterional craniotomy. There is enhancement in the region of the right pterion which is likely due to postsurgical change. Review of the bone  windows on CT reveals missing bone in this area felt to be postsurgical. No other suspicious skull lesion. Sinuses/Orbits: Paranasal sinuses clear. Bilateral cataract extraction Other: None MRA HEAD FINDINGS The initial MRA was performed after intravenous contrast in therefore was repeated on 07/23/2021. Internal carotid artery patent bilaterally. 4 mm aneurysm right cavernous carotid. Anterior cerebral arteries patent bilaterally. Mild stenosis and irregularity the M1 segment bilaterally. Moderate stenosis of the M2 branches bilaterally. Both vertebral arteries patent to the basilar. Basilar widely patent. Fetal origin right posterior cerebral artery. Occlusion of the right posterior cerebral artery P2 segment. Mild stenosis left posterior cerebral artery. MRA NECK FINDINGS Postcontrast imaging was repeated on 12/15 due to suboptimal timing of the [place] attempt. Unfortunately, there is suboptimal arterial opacification of the second toe No significant carotid or vertebral artery stenosis. Limited evaluation IMPRESSION: 1. Negative for acute infarct. 2. Imaging was performed on 2 days due to suboptimal imaging on the first attempt. 3. Right craniotomy. Previously seen cavernous sinus mass on the right on MRI of 2015 has largely resolved. There remains dural thickening and enhancement in the right frontal and temporal lobe and middle cranial fossa presumably related to prior surgery. If there is concern of recurrent tumor, continued follow-up recommended. Enhancement in the right pterion is felt to be postoperative granulation tissue. 4. Mild stenosis M1 segment bilaterally. Moderate stenosis M2 branches bilaterally. 5. Occlusion right posterior cerebral  artery. Mild atherosclerotic disease left posterior cerebral artery. 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 7. Suboptimal MRA neck.  No large vessel occlusion. Electronically Signed   By: Franchot Gallo M.D.   On: 07/23/2021 18:02   MR BRAIN W WO CONTRAST  Result Date: 07/23/2021 CLINICAL DATA:  History of multiple myeloma. Acute neuro deficit. Rule out stroke. History of craniotomy EXAM: MRI HEAD WITHOUT AND WITH CONTRAST MRA HEAD WITHOUT CONTRAST MRA NECK WITHOUT AND WITH CONTRAST TECHNIQUE: Multiplanar, multiecho pulse sequences of the brain and surrounding structures were obtained without and with intravenous contrast. Angiographic images of the Circle of Willis were obtained using MRA technique without intravenous contrast. Angiographic images of the neck were obtained using MRA technique without and with intravenous contrast. Carotid stenosis measurements (when applicable) are obtained utilizing NASCET criteria, using the distal internal carotid diameter as the denominator. CONTRAST:  71m GADAVIST GADOBUTROL 1 MMOL/ML IV SOLN COMPARISON:  CT head 05/11/2021.  MRI head 06/10/2014 FINDINGS: MRI HEAD FINDINGS Brain: Initial images performed on 07/20/2021. The intravenous contrast was administered prior to completing the unenhanced portion of the study, therefore the patient returned for additional imaging on 07/23/2021. Negative for acute infarct. Chronic microvascular ischemic changes in the white matter and pons. Negative for hemorrhage. Postcontrast imaging reveals interval improvement in enhancing mass in the right cavernous sinus identified 2015. There is dural thickening and enhancement in the right middle cranial fossa with associated craniotomy changes. Dural thickening also present in the right frontal region. These findings are most likely postsurgical however continued follow-up is necessary to rule out dural tumor. Vascular: Normal arterial flow voids Skull and upper cervical spine: Right  pterional craniotomy. There is enhancement in the region of the right pterion which is likely due to postsurgical change. Review of the bone windows on CT reveals missing bone in this area felt to be postsurgical. No other suspicious skull lesion. Sinuses/Orbits: Paranasal sinuses clear. Bilateral cataract extraction Other: None MRA HEAD FINDINGS The initial MRA was performed after intravenous contrast in therefore was repeated on 07/23/2021. Internal carotid artery patent bilaterally. 4  mm aneurysm right cavernous carotid. Anterior cerebral arteries patent bilaterally. Mild stenosis and irregularity the M1 segment bilaterally. Moderate stenosis of the M2 branches bilaterally. Both vertebral arteries patent to the basilar. Basilar widely patent. Fetal origin right posterior cerebral artery. Occlusion of the right posterior cerebral artery P2 segment. Mild stenosis left posterior cerebral artery. MRA NECK FINDINGS Postcontrast imaging was repeated on 12/15 due to suboptimal timing of the [place] attempt. Unfortunately, there is suboptimal arterial opacification of the second toe No significant carotid or vertebral artery stenosis. Limited evaluation IMPRESSION: 1. Negative for acute infarct. 2. Imaging was performed on 2 days due to suboptimal imaging on the first attempt. 3. Right craniotomy. Previously seen cavernous sinus mass on the right on MRI of 2015 has largely resolved. There remains dural thickening and enhancement in the right frontal and temporal lobe and middle cranial fossa presumably related to prior surgery. If there is concern of recurrent tumor, continued follow-up recommended. Enhancement in the right pterion is felt to be postoperative granulation tissue. 4. Mild stenosis M1 segment bilaterally. Moderate stenosis M2 branches bilaterally. 5. Occlusion right posterior cerebral artery. Mild atherosclerotic disease left posterior cerebral artery. 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 7.  Suboptimal MRA neck.  No large vessel occlusion. Electronically Signed   By: Franchot Gallo M.D.   On: 07/23/2021 18:02   Assessment/Plan Chemotherapy-induced neuropathy ([place])  Jessica [name] presents today following MRI/MRA, conservative interventions, with no clinical improvement.    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  I do not think these vascular abnormalities are enough by themselves to cause her symptom burden.  Current systemic therapy is also not likely contributing.  We discussed risk factor modifications for atheromatous disease, stressed importance of anti-platelet therapy ASA 63m daily.  The neuropathy is not likely reversible at this stage.  For symptoms, we discussed trial of fludrocortisone 0.085mdaily to support cerebrovascular perfusion.  She understands this could lead to elevated blood pressures measured brachially, but that cerebral perfusion pressure is likely lower due to the above factors (when standing) and symptoms could improve with such an intervention.    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  This would be consistent with her reported postural symptoms and postural hypotension, normal cardiac workup.  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  This could certainly impact autonomic nervous system as well.  Because of her vertigo, visual impairment, history of locally treated plasmacytoma we would like to check CNS parenchyma as well to rule out neoplasm.  CT head obtained last month may not be sufficient to observe recurrent process.    Because of postural symptoms, reasonable concern for  neurovascular process, we will also check an MRA head and neck along with the traditional MRI study.     We provided her with exercises to improve CNS perfusion with position changes.  She will begin working on these to ameliorate symptoms.  We will defer pharmacologic interventions such as fludrocortisone.  ----------------------------------------------------------------------------   We appreciate the opportunity to participate in the care of Jessica [name] She will touch base with again in ~1 month to assess response to therapy.  All questions were answered. The patient knows to call the clinic with any problems, questions or concerns. No barriers to learning were detected.  The total time spent in the encounter was 30 minutes and more than 50% was on counseling and review of test results   Ventura Sellers, MD Medical Director of Neuro-Oncology [facility] [place] 07/27/21 10:47 AM

## 2021-07-28 ENCOUNTER — Telehealth: Payer: Self-pay | Admitting: Internal Medicine

## 2021-07-28 LAB — MULTIPLE MYELOMA PANEL, SERUM
Albumin SerPl Elph-Mcnc: 3.4 g/dL (ref 2.9–4.4)
Albumin/Glob SerPl: 1.4 (ref 0.7–1.7)
Alpha 1: 0.3 g/dL (ref 0.0–0.4)
Alpha2 Glob SerPl Elph-Mcnc: 0.7 g/dL (ref 0.4–1.0)
B-Globulin SerPl Elph-Mcnc: 1 g/dL (ref 0.7–1.3)
Gamma Glob SerPl Elph-Mcnc: 0.5 g/dL (ref 0.4–1.8)
Globulin, Total: 2.5 g/dL (ref 2.2–3.9)
IgA: 24 mg/dL — ABNORMAL LOW (ref 64–422)
IgG (Immunoglobin G), Serum: 566 mg/dL — ABNORMAL LOW (ref 586–1602)
IgM (Immunoglobulin M), Srm: 7 mg/dL — ABNORMAL LOW (ref 26–217)
M Protein SerPl Elph-Mcnc: 0.3 g/dL — ABNORMAL HIGH
Total Protein ELP: 5.9 g/dL — ABNORMAL LOW (ref 6.0–8.5)

## 2021-07-28 NOTE — Telephone Encounter (Signed)
Scheduled per 12/19 los, pt has been called and confirmed

## 2021-07-30 ENCOUNTER — Other Ambulatory Visit: Payer: Self-pay

## 2021-07-30 DIAGNOSIS — C9002 Multiple myeloma in relapse: Secondary | ICD-10-CM

## 2021-07-30 MED ORDER — POMALIDOMIDE 2 MG PO CAPS: ORAL_CAPSULE | ORAL | 0 refills | Status: DC

## 2021-08-18 ENCOUNTER — Inpatient Hospital Stay: Payer: Medicare Other

## 2021-08-18 ENCOUNTER — Other Ambulatory Visit: Payer: Self-pay

## 2021-08-18 ENCOUNTER — Inpatient Hospital Stay: Payer: Medicare Other | Admitting: Hematology and Oncology

## 2021-08-18 ENCOUNTER — Encounter: Payer: Self-pay | Admitting: Hematology and Oncology

## 2021-08-18 ENCOUNTER — Inpatient Hospital Stay: Payer: Medicare Other | Attending: Hematology and Oncology

## 2021-08-18 ENCOUNTER — Inpatient Hospital Stay: Payer: Medicare Other | Admitting: Dietician

## 2021-08-18 VITALS — BP 155/101

## 2021-08-18 DIAGNOSIS — R42 Dizziness and giddiness: Secondary | ICD-10-CM

## 2021-08-18 DIAGNOSIS — D61818 Other pancytopenia: Secondary | ICD-10-CM

## 2021-08-18 DIAGNOSIS — Z5112 Encounter for antineoplastic immunotherapy: Secondary | ICD-10-CM | POA: Insufficient documentation

## 2021-08-18 DIAGNOSIS — C9002 Multiple myeloma in relapse: Secondary | ICD-10-CM | POA: Insufficient documentation

## 2021-08-18 DIAGNOSIS — I10 Essential (primary) hypertension: Secondary | ICD-10-CM

## 2021-08-18 DIAGNOSIS — C9001 Multiple myeloma in remission: Secondary | ICD-10-CM

## 2021-08-18 DIAGNOSIS — Z79899 Other long term (current) drug therapy: Secondary | ICD-10-CM | POA: Insufficient documentation

## 2021-08-18 DIAGNOSIS — E860 Dehydration: Secondary | ICD-10-CM | POA: Diagnosis not present

## 2021-08-18 DIAGNOSIS — Z7189 Other specified counseling: Secondary | ICD-10-CM

## 2021-08-18 DIAGNOSIS — I700 Atherosclerosis of aorta: Secondary | ICD-10-CM | POA: Diagnosis not present

## 2021-08-18 LAB — COMPREHENSIVE METABOLIC PANEL
ALT: 9 U/L (ref 0–44)
AST: 14 U/L — ABNORMAL LOW (ref 15–41)
Albumin: 4.1 g/dL (ref 3.5–5.0)
Alkaline Phosphatase: 72 U/L (ref 38–126)
Anion gap: 8 (ref 5–15)
BUN: 11 mg/dL (ref 8–23)
CO2: 26 mmol/L (ref 22–32)
Calcium: 9 mg/dL (ref 8.9–10.3)
Chloride: 109 mmol/L (ref 98–111)
Creatinine, Ser: 0.9 mg/dL (ref 0.44–1.00)
GFR, Estimated: >60 mL/min (ref >60)
Glucose, Bld: 82 mg/dL (ref 70–99)
Potassium: 3.1 mmol/L — ABNORMAL LOW (ref 3.5–5.1)
Sodium: 143 mmol/L (ref 135–145)
Total Bilirubin: 0.7 mg/dL (ref 0.3–1.2)
Total Protein: 6.5 g/dL (ref 6.5–8.1)

## 2021-08-18 LAB — CBC WITH DIFFERENTIAL/PLATELET
Abs Immature Granulocytes: 0.02 10*3/uL (ref 0.00–0.07)
Basophils Absolute: 0.1 10*3/uL (ref 0.0–0.1)
Basophils Relative: 2 %
Eosinophils Absolute: 0.5 10*3/uL (ref 0.0–0.5)
Eosinophils Relative: 14 %
HCT: 31.9 % — ABNORMAL LOW (ref 36.0–46.0)
Hemoglobin: 10.4 g/dL — ABNORMAL LOW (ref 12.0–15.0)
Immature Granulocytes: 1 %
Lymphocytes Relative: 31 %
Lymphs Abs: 1.2 10*3/uL (ref 0.7–4.0)
MCH: 27.8 pg (ref 26.0–34.0)
MCHC: 32.6 g/dL (ref 30.0–36.0)
MCV: 85.3 fL (ref 80.0–100.0)
Monocytes Absolute: 0.3 10*3/uL (ref 0.1–1.0)
Monocytes Relative: 7 %
Neutro Abs: 1.7 10*3/uL (ref 1.7–7.7)
Neutrophils Relative %: 45 %
Platelets: 186 10*3/uL (ref 150–400)
RBC: 3.74 MIL/uL — ABNORMAL LOW (ref 3.87–5.11)
RDW: 17.3 % — ABNORMAL HIGH (ref 11.5–15.5)
WBC: 3.9 10*3/uL — ABNORMAL LOW (ref 4.0–10.5)
nRBC: 0 % (ref 0.0–0.2)

## 2021-08-18 MED ORDER — ACYCLOVIR 400 MG PO TABS: 400.0000 mg | ORAL_TABLET | Freq: Two times a day (BID) | ORAL | 3 refills | Status: DC

## 2021-08-18 MED ORDER — DARATUMUMAB-HYALURONIDASE-FIHJ 1800-30000 MG-UT/15ML ~~LOC~~ SOLN
1800.0000 mg | Freq: Once | SUBCUTANEOUS | Status: AC
  Administered 2021-08-18: 1800 mg via SUBCUTANEOUS
  Filled 2021-08-18: qty 15

## 2021-08-18 MED ORDER — MONTELUKAST SODIUM 10 MG PO TABS
10.0000 mg | ORAL_TABLET | Freq: Once | ORAL | Status: AC
  Administered 2021-08-18: 10 mg via ORAL
  Filled 2021-08-18: qty 1

## 2021-08-18 MED ORDER — DEXAMETHASONE 4 MG PO TABS
8.0000 mg | ORAL_TABLET | Freq: Once | ORAL | Status: AC
  Administered 2021-08-18: 8 mg via ORAL
  Filled 2021-08-18: qty 2

## 2021-08-18 MED ORDER — ACETAMINOPHEN 325 MG PO TABS
650.0000 mg | ORAL_TABLET | Freq: Once | ORAL | Status: AC
  Administered 2021-08-18: 650 mg via ORAL
  Filled 2021-08-18: qty 2

## 2021-08-18 NOTE — Assessment & Plan Note (Signed)
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

## 2021-08-18 NOTE — Assessment & Plan Note (Signed)
Her dizziness is profound I have reviewed results of recent imaging study and documentation from neuro oncologist She was started on low-dose Florinef I will reach out to the neuro oncologist to see if there is anything else we could add to improve her quality of life

## 2021-08-18 NOTE — Progress Notes (Signed)
Nutrition ° °Patient identified on malnutrition screening tool report (MST) for weight loss.  ° °70 year old female with multiple myeloma in relapse. She is s/p orbital craniectomy, chemotherapy, stem cell transplant in 2016. Patient currently receiving oral chemotherapy with Pomalyst and targeted therapy with Daratumumab q28 days. Patient is followed by Dr. Gorsuch. ° °Met with patient during infusion. She reports ongoing episodes of dizziness and occasional nausea for the last several months. Patient reports seeing specialist for this, cause has yet to be determined. Patient reports she has a great appetite and eats well at home. She reports 3 meals and snacks in between. Patient reports husband usually cooks breakfast meal, lunch and dinner meals prepared/restaurant foods. Patient likes to snack on crackers, ice cream, chips, cheese. She denies nutrition impact symptoms. ° °Patient recalls 130 lbs prior to cancer diagnosis, but more recently 110-115 lbs. ° °Weight 113 lb 12.8 oz today slightly increased from 112 lb 11.2 oz on 12/19 ° °11/28 - 116 lb 14.4 oz °11/15 - 110 lb 9.6 oz °10/17 - 112 lb 3.2 oz  ° °Patient educated about services available at [facility]. Patient does not feel need for nutrition services at this time. She was provided contact information and encouraged to call with nutrition questions or concerns. Patient agreeable and appreciative of visit today.  °

## 2021-08-18 NOTE — Patient Instructions (Signed)
[facility]  Discharge Instructions: Thank you for choosing [facility] to provide your oncology and hematology care.   If you have a lab appointment with the Cancer Center, please go directly to the Cancer Center and check in at the registration area.   Wear comfortable clothing and clothing appropriate for easy access to any Portacath or PICC line.   We strive to give you quality time with your provider. You may need to reschedule your appointment if you arrive late (15 or more minutes).  Arriving late affects you and other patients whose appointments are after yours.  Also, if you miss three or more appointments without notifying the office, you may be dismissed from the clinic at the provider's discretion.      For prescription refill requests, have your pharmacy contact our office and allow 72 hours for refills to be completed.    Today you received the following chemotherapy and/or immunotherapy agents darzalex faspro      To help prevent nausea and vomiting after your treatment, we encourage you to take your nausea medication as directed.  BELOW ARE SYMPTOMS THAT SHOULD BE REPORTED IMMEDIATELY: *FEVER GREATER THAN 100.4 F (38 C) OR HIGHER *CHILLS OR SWEATING *NAUSEA AND VOMITING THAT IS NOT CONTROLLED WITH YOUR NAUSEA MEDICATION *UNUSUAL SHORTNESS OF BREATH *UNUSUAL BRUISING OR BLEEDING *URINARY PROBLEMS (pain or burning when urinating, or frequent urination) *BOWEL PROBLEMS (unusual diarrhea, constipation, pain near the anus) TENDERNESS IN MOUTH AND THROAT WITH OR WITHOUT PRESENCE OF ULCERS (sore throat, sores in mouth, or a toothache) UNUSUAL RASH, SWELLING OR PAIN  UNUSUAL VAGINAL DISCHARGE OR ITCHING   Items with * indicate a potential emergency and should be followed up as soon as possible or go to the Emergency Department if any problems should occur.  Please show the CHEMOTHERAPY ALERT CARD or IMMUNOTHERAPY ALERT CARD at  check-in to the Emergency Department and triage nurse.  Should you have questions after your visit or need to cancel or reschedule your appointment, please contact [facility]  Dept: [phone]  and follow the prompts.  Office hours are 8:00 a.m. to 4:30 p.m. Monday - Friday. Please note that voicemails left after 4:00 p.m. may not be returned until the following business day.  We are closed weekends and major holidays. You have access to a nurse at all times for urgent questions. Please call the main number to the clinic Dept: [phone] and follow the prompts.   For any non-urgent questions, you may also contact your provider using MyChart. We now offer e-Visits for anyone 18 and older to request care online for non-urgent symptoms. For details visit [email or web address].   Also download the MyChart app! Go to the app store, search "MyChart", open the app, select [place], and log in with your MyChart username and password.  Due to Covid, a mask is required upon entering the hospital/clinic. If you do not have a mask, one will be given to you upon arrival. For doctor visits, patients may have 1 support person aged 18 or older with them. For treatment visits, patients cannot have anyone with them due to current Covid guidelines and our immunocompromised population.   

## 2021-08-18 NOTE — Assessment & Plan Note (Signed)
From the multiple myeloma standpoint, she is doing well with positive response to treatment °However, she is quite debilitated by dizziness °I will reach out to the neuro oncologist to manage to separately °She will continue treatment with Pomalyst and monthly daratumumab °

## 2021-08-18 NOTE — Assessment & Plan Note (Signed)
The blood pressure fluctuated widely She has very high diastolic blood pressure It is not clear whether this is the cause of her worsening dizziness I will reach out to neuro oncologist to see if we can adjust the dose of her Florinef

## 2021-08-18 NOTE — Progress Notes (Signed)
[place] OFFICE PROGRESS NOTE  Patient Care Team: Josetta Huddle, MD as PCP - General (Internal Medicine) Buford Dresser, MD as PCP - Cardiology (Cardiology) Ginette Pitman, MD as Consulting Physician (Hematology and Oncology) Hessie Dibble, MD as Consulting Physician (Hematology and Oncology)  ASSESSMENT & PLAN:  Multiple myeloma in relapse [facility]) From the multiple myeloma standpoint, she is doing well with positive response to treatment However, she is quite debilitated by dizziness I will reach out to the neuro oncologist to manage to separately She will continue treatment with Pomalyst and monthly daratumumab  Pancytopenia, acquired 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 Observe closely This is not the cause of her dizziness  Postural dizziness Her dizziness is profound I have reviewed results of recent imaging study and documentation from neuro oncologist She was started on low-dose Florinef I will reach out to the neuro oncologist to see if there is anything else we could add to improve her quality of life  Essential hypertension The blood pressure fluctuated widely She has very high diastolic blood pressure It is not clear whether this is the cause of her worsening dizziness I will reach out to neuro oncologist to see if we can adjust the dose of her Florinef  No orders of the defined types were placed in this encounter.   All questions were answered. The patient knows to call the clinic with any problems, questions or concerns. The total time spent in the appointment was 30 minutes encounter with patients including review of chart and various tests results, discussions about plan of care and coordination of care plan   [name], MD 08/18/2021 3:33 PM  INTERVAL HISTORY: Please see below for problem oriented charting. she returns for treatment follow-up on monthly daratumumab and Pomalyst for recurrent  multiple myeloma She was referred to see neuro oncologist for significant dizziness and was started on low-dose Florinef She has not been checking her blood pressure on a regular basis at home Dizziness is quite debilitating and she is not able to function much No recent falls No recent infection, fever or chills  REVIEW OF SYSTEMS:   Constitutional: Denies fevers, chills or abnormal weight loss Eyes: Denies blurriness of vision Ears, nose, mouth, throat, and face: Denies mucositis or sore throat Respiratory: Denies cough, dyspnea or wheezes Cardiovascular: Denies palpitation, chest discomfort or lower extremity swelling Gastrointestinal:  Denies nausea, heartburn or change in bowel habits Skin: Denies abnormal skin rashes Lymphatics: Denies new lymphadenopathy or easy bruising Behavioral/Psych: Mood is stable, no new changes  All other systems were reviewed with the patient and are negative.  I have reviewed the past medical history, past surgical history, social history and family history with the patient and they are unchanged from previous note.  ALLERGIES:  is allergic to augmentin [amoxicillin-pot clavulanate], albuterol, codeine, ibuprofen, nsaids, tolmetin, and tetanus-diphth-acell pertussis.  MEDICATIONS:  Current Outpatient Medications  Medication Sig Dispense Refill   acetaminophen (TYLENOL) 500 MG tablet Take 500-1,000 mg by mouth every 6 (six) hours as needed for mild pain, moderate pain, fever or headache.     acyclovir (ZOVIRAX) 400 MG tablet Take 1 tablet (400 mg total) by mouth 2 (two) times daily. 180 tablet 3   aspirin EC 81 MG tablet Take 81 mg by mouth daily with breakfast.     calcium carbonate (OSCAL) 1500 (600 Ca) MG TABS tablet Take 1,500 mg by mouth daily with breakfast.     Calcium Carbonate 500 MG  CHEW Chew 500 mg by mouth as needed for indigestion. (Patient not taking: Reported on 07/06/2021)     Cholecalciferol (VITAMIN D3) 2000 units capsule Take 2,000  Units by mouth daily.      fludrocortisone (FLORINEF) 0.1 MG tablet Take 0.5 tablets (0.05 mg total) by mouth daily. 30 tablet 1   loratadine (CLARITIN) 10 MG tablet Take 10 mg by mouth daily.     Multiple Vitamins-Minerals (CENTRUM SILVER PO) Take 1 tablet by mouth daily.      omeprazole (PRILOSEC) 20 MG capsule TAKE 1 CAPSULE(20 MG) BY MOUTH DAILY 90 capsule 11   ondansetron (ZOFRAN) 8 MG tablet TAKE 1 TABLET(8 MG) BY MOUTH EVERY 8 HOURS AS NEEDED FOR NAUSEA OR VOMITING 60 tablet 11   pomalidomide (POMALYST) 2 MG capsule Take 1 pill daily for 14 days then rest 7 days for cycle of every 21 days 14 capsule 0   senna-docusate (SENOKOT-S) 8.6-50 MG tablet Take 1-2 tablets by mouth daily as needed for mild constipation or moderate constipation.     sodium chloride (OCEAN) 0.65 % SOLN nasal spray Place 1 spray into both nostrils as needed for congestion.     vitamin B-12 (CYANOCOBALAMIN) 500 MCG tablet Take 500 mcg by mouth daily.     No current facility-administered medications for this visit.   Facility-Administered Medications Ordered in Other Visits  Medication Dose Route Frequency Provider Last Rate Last Admin   0.9 %  sodium chloride infusion   Intravenous Once Alvy Bimler, Traivon Morrical, MD        SUMMARY OF ONCOLOGIC HISTORY: Oncology History Overview Note   M-protein 0.69 gm/dl IFIX - IgG, Kappa IgG - 868 IgA - 19 IgM - < 20 Kappa - 21 Lambda - 5.7  09/06/2014 - Bone marrow aspirate and biopsy:   Normocellular marrow for age (40%) with a small monoclonal plasma cell population (1% on aspirate). Karyotype 67, XX  FISH Negative for myeloma associated changes  09/12/2014 - PET/CT  Two regions that are concerning for disease, one adjacent/involving the left ninth rib and one in the marrow of the right femur, in this patient with history of plasmacytoma.    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   04/01/2015 Bone Marrow Transplant   She received melphalan chemotherapy on 03/31/2015 followed by autologous stem cell transplant the day after   04/03/2015 - 04/18/2015 Hospital Admission   The patient was admitted to the hospital at [place] for management related  to complication from stem cell transplant. She had significant nausea requiring intravenous anti-emetics.   07/17/2015 -  Chemotherapy   She started maintenance Revlimid and monthly zometa, then every 3 months   06/01/2018 Imaging   DEXA scan showed bone density T score in femur -2.3   04/20/2019 PET scan   1. No FDG avid osseous lesions or mass identified to suggest metabolically active lesion of myeloma or plasmacytoma. 2. Small nodular density within the paravertebral right lower lobe exhibits mild to moderate increased uptake within SUV max of 3.38. This is indeterminate. Review of CT chest from 10/05/2018 shows a corresponding Lung nodule in this area measuring the same. Small pulmonary neoplasm cannot be excluded. 3. Indeterminate, focal area of increased uptake is  identified within the thoracic canal. Indeterminate favored to represent benign physiologic CNS activity.     04/30/2019 -  Chemotherapy   Patient is on Treatment Plan : MYELOMA SQ Daratumumab Faspro q28d     04/30/2019 - 07/09/2019 Chemotherapy   The patient had bortezomib SQ (VELCADE) chemo injection 1.75 mg, 1.3 mg/m2 = 1.75 mg, Subcutaneous,  Once, 10 of 11 cycles Administration: 1.75 mg (04/30/2019), 1.75 mg (05/07/2019), 1.75 mg (05/14/2019), 1.75 mg (05/21/2019), 1.75 mg (05/28/2019), 1.75 mg (06/04/2019), 1.75 mg (06/11/2019), 1.75 mg (06/18/2019), 1.75 mg (06/25/2019), 1.75 mg (07/09/2019)   for chemotherapy treatment.       PHYSICAL EXAMINATION: ECOG PERFORMANCE STATUS: 2 - Symptomatic, <50% confined to bed  Vitals:   08/18/21 0909  BP: (!) 134/101  Pulse: 69  Resp: 18  Temp: 97.8 F (36.6 C)  SpO2: 99%   Filed Weights   08/18/21 0909  Weight: 113 lb 12.8 oz (51.6 kg)    GENERAL:alert, no distress and comfortable SKIN: skin color, texture, turgor are normal, no rashes or significant lesions EYES: normal, Conjunctiva are pink and non-injected, sclera clear OROPHARYNX:no exudate, no erythema and lips, buccal mucosa, and tongue normal  NECK: supple, thyroid normal size, non-tender, without nodularity LYMPH:  no palpable lymphadenopathy in the cervical, axillary or inguinal LUNGS: clear to auscultation and percussion with normal breathing effort HEART: regular rate & rhythm and no murmurs and no lower extremity edema ABDOMEN:abdomen soft, non-tender and normal bowel sounds Musculoskeletal:no cyanosis of digits and no clubbing  NEURO: alert & oriented x 3 with fluent speech, no focal motor/sensory deficits  LABORATORY DATA:  I have reviewed the data as listed    Component Value Date/Time   NA 143 08/18/2021 0843   NA 143 07/14/2017 1245   K 3.1 (L) 08/18/2021 0843   K 3.3 (L) 07/14/2017 1245   CL 109 08/18/2021 0843   CO2 26 08/18/2021 0843   CO2 20 (L) 07/14/2017  1245   GLUCOSE 82 08/18/2021 0843   GLUCOSE 106 07/14/2017 1245   BUN 11 08/18/2021 0843   BUN 9.3 07/14/2017 1245   CREATININE 0.90 08/18/2021 0843   CREATININE 1.01 (H) 01/27/2021 0847   CREATININE 0.8 07/14/2017 1245   CALCIUM 9.0 08/18/2021 0843   CALCIUM 8.4 07/14/2017 1245   PROT 6.5 08/18/2021 0843   PROT 6.0 07/14/2017 1245   PROT 6.2 (L) 07/14/2017 1245   ALBUMIN 4.1 08/18/2021 0843   ALBUMIN 3.6 07/14/2017 1245   AST 14 (L) 08/18/2021 0843   AST 21 01/27/2021 0847   AST 21 07/14/2017 1245   ALT 9 08/18/2021 0843   ALT 14 01/27/2021 0847   ALT 21 07/14/2017 1245   ALKPHOS 72 08/18/2021 0843   ALKPHOS 78 07/14/2017 1245  BILITOT 0.7 08/18/2021 0843   BILITOT 0.6 01/27/2021 0847   BILITOT 0.43 07/14/2017 1245   GFRNONAA >60 08/18/2021 0843   GFRNONAA 60 (L) 01/27/2021 0847   GFRAA 57 (L) 04/21/2020 0817   GFRAA >60 01/14/2020 0828    No results found for: SPEP, UPEP  Lab Results  Component Value Date   WBC 3.9 (L) 08/18/2021   NEUTROABS 1.7 08/18/2021   HGB 10.4 (L) 08/18/2021   HCT 31.9 (L) 08/18/2021   MCV 85.3 08/18/2021   PLT 186 08/18/2021      Chemistry      Component Value Date/Time   NA 143 08/18/2021 0843   NA 143 07/14/2017 1245   K 3.1 (L) 08/18/2021 0843   K 3.3 (L) 07/14/2017 1245   CL 109 08/18/2021 0843   CO2 26 08/18/2021 0843   CO2 20 (L) 07/14/2017 1245   BUN 11 08/18/2021 0843   BUN 9.3 07/14/2017 1245   CREATININE 0.90 08/18/2021 0843   CREATININE 1.01 (H) 01/27/2021 0847   CREATININE 0.8 07/14/2017 1245      Component Value Date/Time   CALCIUM 9.0 08/18/2021 0843   CALCIUM 8.4 07/14/2017 1245   ALKPHOS 72 08/18/2021 0843   ALKPHOS 78 07/14/2017 1245   AST 14 (L) 08/18/2021 0843   AST 21 01/27/2021 0847   AST 21 07/14/2017 1245   ALT 9 08/18/2021 0843   ALT 14 01/27/2021 0847   ALT 21 07/14/2017 1245   BILITOT 0.7 08/18/2021 0843   BILITOT 0.6 01/27/2021 0847   BILITOT 0.43 07/14/2017 1245       RADIOGRAPHIC  STUDIES: I have personally reviewed the radiological images as listed and agreed with the findings in the report. MR ANGIO HEAD WO CONTRAST  Result Date: 07/23/2021 CLINICAL DATA:  History of multiple myeloma. Acute neuro deficit. Rule out stroke. History of craniotomy EXAM: MRI HEAD WITHOUT AND WITH CONTRAST MRA HEAD WITHOUT CONTRAST MRA NECK WITHOUT AND WITH CONTRAST TECHNIQUE: Multiplanar, multiecho pulse sequences of the brain and surrounding structures were obtained without and with intravenous contrast. Angiographic images of the Circle of Willis were obtained using MRA technique without intravenous contrast. Angiographic images of the neck were obtained using MRA technique without and with intravenous contrast. Carotid stenosis measurements (when applicable) are obtained utilizing NASCET criteria, using the distal internal carotid diameter as the denominator. CONTRAST:  17m GADAVIST GADOBUTROL 1 MMOL/ML IV SOLN COMPARISON:  CT head 05/11/2021.  MRI head 06/10/2014 FINDINGS: MRI HEAD FINDINGS Brain: Initial images performed on 07/20/2021. The intravenous contrast was administered prior to completing the unenhanced portion of the study, therefore the patient returned for additional imaging on 07/23/2021. Negative for acute infarct. Chronic microvascular ischemic changes in the white matter and pons. Negative for hemorrhage. Postcontrast imaging reveals interval improvement in enhancing mass in the right cavernous sinus identified 2015. There is dural thickening and enhancement in the right middle cranial fossa with associated craniotomy changes. Dural thickening also present in the right frontal region. These findings are most likely postsurgical however continued follow-up is necessary to rule out dural tumor. Vascular: Normal arterial flow voids Skull and upper cervical spine: Right pterional craniotomy. There is enhancement in the region of the right pterion which is likely due to postsurgical change.  Review of the bone windows on CT reveals missing bone in this area felt to be postsurgical. No other suspicious skull lesion. Sinuses/Orbits: Paranasal sinuses clear. Bilateral cataract extraction Other: None MRA HEAD FINDINGS The initial MRA was performed after intravenous contrast  in therefore was repeated on 07/23/2021. Internal carotid artery patent bilaterally. 4 mm aneurysm right cavernous carotid. Anterior cerebral arteries patent bilaterally. Mild stenosis and irregularity the M1 segment bilaterally. Moderate stenosis of the M2 branches bilaterally. Both vertebral arteries patent to the basilar. Basilar widely patent. Fetal origin right posterior cerebral artery. Occlusion of the right posterior cerebral artery P2 segment. Mild stenosis left posterior cerebral artery. MRA NECK FINDINGS Postcontrast imaging was repeated on 12/15 due to suboptimal timing of the [place] attempt. Unfortunately, there is suboptimal arterial opacification of the second toe No significant carotid or vertebral artery stenosis. Limited evaluation IMPRESSION: 1. Negative for acute infarct. 2. Imaging was performed on 2 days due to suboptimal imaging on the first attempt. 3. Right craniotomy. Previously seen cavernous sinus mass on the right on MRI of 2015 has largely resolved. There remains dural thickening and enhancement in the right frontal and temporal lobe and middle cranial fossa presumably related to prior surgery. If there is concern of recurrent tumor, continued follow-up recommended. Enhancement in the right pterion is felt to be postoperative granulation tissue. 4. Mild stenosis M1 segment bilaterally. Moderate stenosis M2 branches bilaterally. 5. Occlusion right posterior cerebral artery. Mild atherosclerotic disease left posterior cerebral artery. 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 7. Suboptimal MRA neck.  No large vessel occlusion. Electronically Signed   By: Franchot Gallo M.D.   On: 07/23/2021 18:02   MR  ANGIO NECK W WO CONTRAST  Result Date: 07/23/2021 CLINICAL DATA:  History of multiple myeloma. Acute neuro deficit. Rule out stroke. History of craniotomy EXAM: MRI HEAD WITHOUT AND WITH CONTRAST MRA HEAD WITHOUT CONTRAST MRA NECK WITHOUT AND WITH CONTRAST TECHNIQUE: Multiplanar, multiecho pulse sequences of the brain and surrounding structures were obtained without and with intravenous contrast. Angiographic images of the Circle of Willis were obtained using MRA technique without intravenous contrast. Angiographic images of the neck were obtained using MRA technique without and with intravenous contrast. Carotid stenosis measurements (when applicable) are obtained utilizing NASCET criteria, using the distal internal carotid diameter as the denominator. CONTRAST:  3m GADAVIST GADOBUTROL 1 MMOL/ML IV SOLN COMPARISON:  CT head 05/11/2021.  MRI head 06/10/2014 FINDINGS: MRI HEAD FINDINGS Brain: Initial images performed on 07/20/2021. The intravenous contrast was administered prior to completing the unenhanced portion of the study, therefore the patient returned for additional imaging on 07/23/2021. Negative for acute infarct. Chronic microvascular ischemic changes in the white matter and pons. Negative for hemorrhage. Postcontrast imaging reveals interval improvement in enhancing mass in the right cavernous sinus identified 2015. There is dural thickening and enhancement in the right middle cranial fossa with associated craniotomy changes. Dural thickening also present in the right frontal region. These findings are most likely postsurgical however continued follow-up is necessary to rule out dural tumor. Vascular: Normal arterial flow voids Skull and upper cervical spine: Right pterional craniotomy. There is enhancement in the region of the right pterion which is likely due to postsurgical change. Review of the bone windows on CT reveals missing bone in this area felt to be postsurgical. No other suspicious skull  lesion. Sinuses/Orbits: Paranasal sinuses clear. Bilateral cataract extraction Other: None MRA HEAD FINDINGS The initial MRA was performed after intravenous contrast in therefore was repeated on 07/23/2021. Internal carotid artery patent bilaterally. 4 mm aneurysm right cavernous carotid. Anterior cerebral arteries patent bilaterally. Mild stenosis and irregularity the M1 segment bilaterally. Moderate stenosis of the M2 branches bilaterally. Both vertebral arteries patent to the basilar. Basilar widely patent. Fetal origin  right posterior cerebral artery. Occlusion of the right posterior cerebral artery P2 segment. Mild stenosis left posterior cerebral artery. MRA NECK FINDINGS Postcontrast imaging was repeated on 12/15 due to suboptimal timing of the [place] attempt. Unfortunately, there is suboptimal arterial opacification of the second toe No significant carotid or vertebral artery stenosis. Limited evaluation IMPRESSION: 1. Negative for acute infarct. 2. Imaging was performed on 2 days due to suboptimal imaging on the first attempt. 3. Right craniotomy. Previously seen cavernous sinus mass on the right on MRI of 2015 has largely resolved. There remains dural thickening and enhancement in the right frontal and temporal lobe and middle cranial fossa presumably related to prior surgery. If there is concern of recurrent tumor, continued follow-up recommended. Enhancement in the right pterion is felt to be postoperative granulation tissue. 4. Mild stenosis M1 segment bilaterally. Moderate stenosis M2 branches bilaterally. 5. Occlusion right posterior cerebral artery. Mild atherosclerotic disease left posterior cerebral artery. 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 7. Suboptimal MRA neck.  No large vessel occlusion. Electronically Signed   By: Franchot Gallo M.D.   On: 07/23/2021 18:02   MR BRAIN W WO CONTRAST  Result Date: 07/23/2021 CLINICAL DATA:  History of multiple myeloma. Acute neuro deficit. Rule out  stroke. History of craniotomy EXAM: MRI HEAD WITHOUT AND WITH CONTRAST MRA HEAD WITHOUT CONTRAST MRA NECK WITHOUT AND WITH CONTRAST TECHNIQUE: Multiplanar, multiecho pulse sequences of the brain and surrounding structures were obtained without and with intravenous contrast. Angiographic images of the Circle of Willis were obtained using MRA technique without intravenous contrast. Angiographic images of the neck were obtained using MRA technique without and with intravenous contrast. Carotid stenosis measurements (when applicable) are obtained utilizing NASCET criteria, using the distal internal carotid diameter as the denominator. CONTRAST:  46m GADAVIST GADOBUTROL 1 MMOL/ML IV SOLN COMPARISON:  CT head 05/11/2021.  MRI head 06/10/2014 FINDINGS: MRI HEAD FINDINGS Brain: Initial images performed on 07/20/2021. The intravenous contrast was administered prior to completing the unenhanced portion of the study, therefore the patient returned for additional imaging on 07/23/2021. Negative for acute infarct. Chronic microvascular ischemic changes in the white matter and pons. Negative for hemorrhage. Postcontrast imaging reveals interval improvement in enhancing mass in the right cavernous sinus identified 2015. There is dural thickening and enhancement in the right middle cranial fossa with associated craniotomy changes. Dural thickening also present in the right frontal region. These findings are most likely postsurgical however continued follow-up is necessary to rule out dural tumor. Vascular: Normal arterial flow voids Skull and upper cervical spine: Right pterional craniotomy. There is enhancement in the region of the right pterion which is likely due to postsurgical change. Review of the bone windows on CT reveals missing bone in this area felt to be postsurgical. No other suspicious skull lesion. Sinuses/Orbits: Paranasal sinuses clear. Bilateral cataract extraction Other: None MRA HEAD FINDINGS The initial MRA was  performed after intravenous contrast in therefore was repeated on 07/23/2021. Internal carotid artery patent bilaterally. 4 mm aneurysm right cavernous carotid. Anterior cerebral arteries patent bilaterally. Mild stenosis and irregularity the M1 segment bilaterally. Moderate stenosis of the M2 branches bilaterally. Both vertebral arteries patent to the basilar. Basilar widely patent. Fetal origin right posterior cerebral artery. Occlusion of the right posterior cerebral artery P2 segment. Mild stenosis left posterior cerebral artery. MRA NECK FINDINGS Postcontrast imaging was repeated on 12/15 due to suboptimal timing of the [place]. Unfortunately, there is suboptimal arterial opacification of the second toe No significant carotid or  vertebral artery stenosis. Limited evaluation IMPRESSION: 1. Negative for acute infarct. 2. Imaging was performed on 2 days due to suboptimal imaging on the first attempt. 3. Right craniotomy. Previously seen cavernous sinus mass on the right on MRI of 2015 has largely resolved. There remains dural thickening and enhancement in the right frontal and temporal lobe and middle cranial fossa presumably related to prior surgery. If there is concern of recurrent tumor, continued follow-up recommended. Enhancement in the right pterion is felt to be postoperative granulation tissue. 4. Mild stenosis M1 segment bilaterally. Moderate stenosis M2 branches bilaterally. 5. Occlusion right posterior cerebral artery. Mild atherosclerotic disease left posterior cerebral artery. 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 7. Suboptimal MRA neck.  No large vessel occlusion. Electronically Signed   By: Franchot Gallo M.D.   On: 07/23/2021 18:02

## 2021-08-19 ENCOUNTER — Other Ambulatory Visit: Payer: Self-pay

## 2021-08-19 DIAGNOSIS — C9002 Multiple myeloma in relapse: Secondary | ICD-10-CM

## 2021-08-19 LAB — KAPPA/LAMBDA LIGHT CHAINS
Kappa free light chain: 17 mg/L (ref 3.3–19.4)
Kappa, lambda light chain ratio: 1.93 — ABNORMAL HIGH (ref 0.26–1.65)
Lambda free light chains: 8.8 mg/L (ref 5.7–26.3)

## 2021-08-19 MED ORDER — POMALIDOMIDE 2 MG PO CAPS: ORAL_CAPSULE | ORAL | 0 refills | Status: DC

## 2021-08-20 IMAGING — PT NM PET TUM IMG INITIAL (PI) WHOLE BODY
1 of 8 series · 3 of 25 positions shown · non-contrast
Comparison: CT chest, abdomen and pelvis from 10/05/2018

CLINICAL DATA: Subsequent treatment strategy for myeloma.

EXAM:
NUCLEAR MEDICINE PET WHOLE BODY
TECHNIQUE: 5.2 mCi F-18 FDG was injected intravenously. Full-ring PET imaging
was performed from the skull base to thigh after the radiotracer. CT
data was obtained and used for attenuation correction and anatomic
localization.
Fasting blood glucose: 77 mg/dl

[Series 4: ct wb 5.0 hd_fov · axial · 5.0mm · 1.09mm/px · z∈[+624,+1464]mm · 3 of 421 slices shown]
[im 106/421  soft-tissue]
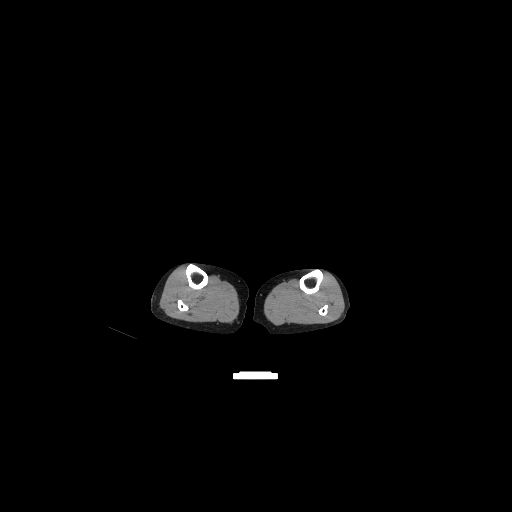
[im 211/421  soft-tissue]
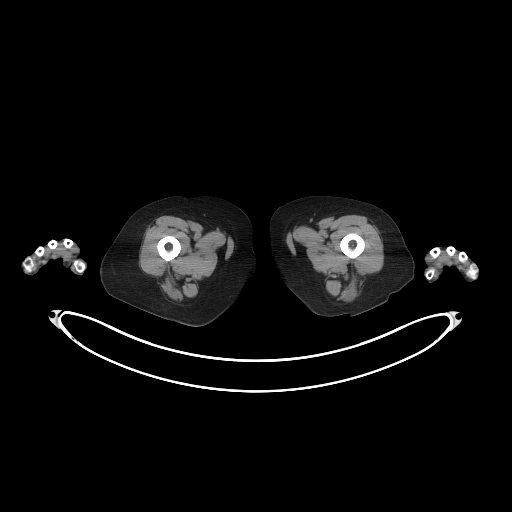
[im 316/421  soft-tissue]
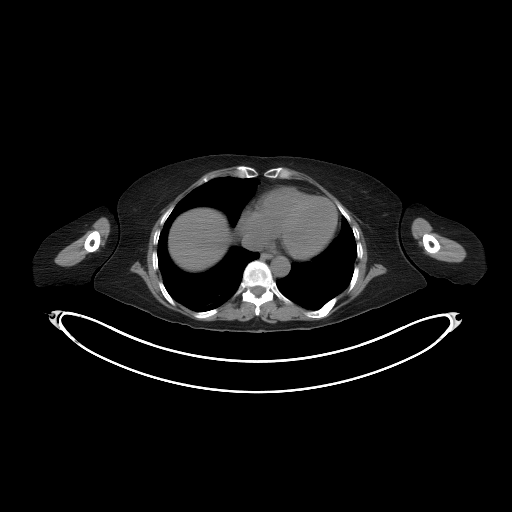

[3 of 25 positions shown; findings below may reference images not displayed]

FINDINGS: Mediastinal blood pool activity: SUV max

HEAD/NECK: No hypermetabolic activity in the scalp. No
hypermetabolic cervical lymph nodes.

Incidental CT findings: none

CHEST: No hypermetabolic mediastinal or hilar nodes. Small nodule
within the paravertebral right lower lobe measuring 7 mm has an SUV
max 3.38. Indeterminate. Bandlike area of increased uptake (SUV max
4.12 within the posterior right base corresponds to an area of focal
ground-glass attenuation and subsegmental atelectasis, image 52/8.
Likely inflammatory.

Incidental CT findings: Aortic atherosclerosis.

ABDOMEN/PELVIS: No abnormal hypermetabolic activity within the
liver, pancreas, adrenal glands, or spleen. No hypermetabolic lymph
nodes in the abdomen or pelvis.

Incidental CT findings: none

SKELETON: No focal hypermetabolic activity to suggest skeletal
metastasis. Focal area of increased uptake within the lower thoracic
canal is identified within SUV max of 4.31. Etiology indeterminate.

Incidental CT findings: Previous right frontal craniotomy.

EXTREMITIES: No abnormal hypermetabolic activity in the lower
extremities.

Incidental CT findings: none
IMPRESSION: 1. No FDG avid osseous lesions or mass identified to suggest
metabolically active lesion of myeloma or plasmacytoma.
2. Small nodular density within the paravertebral right lower lobe
exhibits mild to moderate increased uptake within SUV max of 3.38.
This is indeterminate. Review of CT chest from 10/05/2018 shows a
corresponding Lung nodule in this area measuring the same. Small
pulmonary neoplasm cannot be excluded.
3. Indeterminate, focal area of increased uptake is identified
within the thoracic canal. Indeterminate favored to represent benign
physiologic CNS activity.

## 2021-08-24 ENCOUNTER — Inpatient Hospital Stay (HOSPITAL_BASED_OUTPATIENT_CLINIC_OR_DEPARTMENT_OTHER): Payer: Medicare Other | Admitting: Internal Medicine

## 2021-08-24 DIAGNOSIS — R42 Dizziness and giddiness: Secondary | ICD-10-CM

## 2021-08-24 LAB — MULTIPLE MYELOMA PANEL, SERUM
Albumin SerPl Elph-Mcnc: 3.8 g/dL (ref 2.9–4.4)
Albumin/Glob SerPl: 1.6 (ref 0.7–1.7)
Alpha 1: 0.3 g/dL (ref 0.0–0.4)
Alpha2 Glob SerPl Elph-Mcnc: 0.7 g/dL (ref 0.4–1.0)
B-Globulin SerPl Elph-Mcnc: 1 g/dL (ref 0.7–1.3)
Gamma Glob SerPl Elph-Mcnc: 0.5 g/dL (ref 0.4–1.8)
Globulin, Total: 2.4 g/dL (ref 2.2–3.9)
IgA: 25 mg/dL — ABNORMAL LOW (ref 64–422)
IgG (Immunoglobin G), Serum: 570 mg/dL — ABNORMAL LOW (ref 586–1602)
IgM (Immunoglobulin M), Srm: 9 mg/dL — ABNORMAL LOW (ref 26–217)
M Protein SerPl Elph-Mcnc: 0.3 g/dL — ABNORMAL HIGH
Total Protein ELP: 6.2 g/dL (ref 6.0–8.5)

## 2021-08-24 NOTE — Progress Notes (Signed)
I connected with Jessica [name] on 08/24/21 at  9:30 AM EST by telephone visit and verified that I am speaking with the correct person using two identifiers.  I discussed the limitations, risks, security and privacy concerns of performing an evaluation and management service by telemedicine and the availability of in-person appointments. I also discussed with the patient that there may be a patient responsible charge related to this service. The patient expressed understanding and agreed to proceed.  Other persons participating in the visit and their role in the encounter:  n/a  Patient's location:  Home  Provider's location:  Office  Chief Complaint:  Postural dizziness  History of Present Ilness: Jessica [name] describes absolutely no improvement in her dizziness upon standing since starting the Florinef.  Multiple times, however, she has had elevated blood pressure readings at home.  No other intervention discussed has improved her symptom burden.    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   Corticosteroid support has not been helpful and has increased systemic blood pressure.  Follow Up Instructions: Recommended referral to tertiary neuromuscular center.  She would prefer to start with [facility].  Will place referral, provided her with the clinic number for [facility].  I discussed the assessment and treatment plan with the patient.  The patient was provided an opportunity to ask questions and all were answered.  The patient agreed with the plan and demonstrated understanding of the instructions.    The patient was advised to call back or seek an in-person evaluation if the symptoms worsen or if the condition fails to improve as anticipated.  I provided 5-10 minutes of non-face-to-face time during this enocunter.  Jessica Sellers, MD   I  provided 15 minutes of non face-to-face telephone visit time during this encounter, and > 50% was spent counseling as documented under my assessment & plan.

## 2021-08-25 ENCOUNTER — Other Ambulatory Visit: Payer: Self-pay

## 2021-08-25 ENCOUNTER — Inpatient Hospital Stay (HOSPITAL_BASED_OUTPATIENT_CLINIC_OR_DEPARTMENT_OTHER): Payer: Medicare Other | Admitting: Hematology and Oncology

## 2021-08-25 ENCOUNTER — Encounter: Payer: Self-pay | Admitting: Hematology and Oncology

## 2021-08-25 DIAGNOSIS — C9002 Multiple myeloma in relapse: Secondary | ICD-10-CM

## 2021-08-25 DIAGNOSIS — R42 Dizziness and giddiness: Secondary | ICD-10-CM | POA: Diagnosis not present

## 2021-08-25 NOTE — Progress Notes (Signed)
HEMATOLOGY-ONCOLOGY ELECTRONIC VISIT PROGRESS NOTE  Patient Care Team: Josetta Huddle, MD as PCP - General (Internal Medicine) Buford Dresser, MD as PCP - Cardiology (Cardiology) Ginette Pitman, MD as Consulting Physician (Hematology and Oncology) Hessie Dibble, MD as Consulting Physician (Hematology and Oncology)  I connected with the patient via telephone conference and verified that I am speaking with the correct person using two identifiers. The patient's location is at home and I am providing care from the [facility] I discussed the limitations, risks, security and privacy concerns of performing an evaluation and management service by e-visits and the availability of in person appointments.  I also discussed with the patient that there may be a patient responsible charge related to this service. The patient expressed understanding and agreed to proceed.   ASSESSMENT & PLAN:  Multiple myeloma in relapse ([place]) I have reviewed recent myeloma panel with the patient and her husband She is achieving good response to treatment We will continue the same  Postural dizziness I have reviewed documentation and recommendation from neuro oncologist Referral is pending The patient has since stopped taking Florinef and felt a bit better We discussed importance of hydration and close monitoring her blood pressure I anticipate her diastolic blood pressure will improve upon stopping of Florinef  No orders of the defined types were placed in this encounter.   INTERVAL HISTORY: Please see below for problem oriented charting. The purpose of today's discussion is reviewed blood pressure and symptom management for severe postural dizziness from last visit Since her last visit, she had virtual visit with neuro oncologist yesterday I have reviewed the detailed documentation with the patient She has stopped taking Florinef since then and awaiting referral She felt slightly better  since discontinuation of Florinef She continues to have chronic dizziness but denies fall She is attempting to hydrate herself on a regular basis Her husband is by her side and has no other questions  SUMMARY OF ONCOLOGIC HISTORY: Oncology History Overview Note   M-protein 0.69 gm/dl IFIX - IgG, Kappa IgG - 868 IgA - 19 IgM - < 20 Kappa - 21 Lambda - 5.7  09/06/2014 - Bone marrow aspirate and biopsy:   Normocellular marrow for age (40%) with a small monoclonal plasma cell population (1% on aspirate). Karyotype 54, XX  FISH Negative for myeloma associated changes  09/12/2014 - PET/CT  Two regions that are concerning for disease, one adjacent/involving the left ninth rib and one in the marrow of the right femur, in this patient with history of plasmacytoma.    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   04/01/2015 Bone Marrow Transplant   She received melphalan chemotherapy on 03/31/2015 followed by autologous stem cell transplant the day after    04/03/2015 - 04/18/2015 Hospital Admission   The patient was admitted to the hospital at [facility] for management related to complication from stem cell transplant. She had significant nausea requiring intravenous anti-emetics.   07/17/2015 -  Chemotherapy   She started maintenance Revlimid and monthly zometa, then every 3 months   06/01/2018 Imaging   DEXA scan showed bone density T score in femur -2.3   04/20/2019 PET scan   1. No FDG avid osseous lesions or mass identified to suggest metabolically active lesion of myeloma or plasmacytoma. 2. Small nodular density within the paravertebral right lower lobe exhibits mild to moderate increased uptake within SUV max of 3.38. This is indeterminate. Review of CT chest from 10/05/2018 shows a corresponding Lung nodule in this area measuring the same. Small pulmonary neoplasm cannot be excluded. 3. Indeterminate, focal area of increased uptake is identified within the thoracic canal. Indeterminate favored to represent benign physiologic CNS activity.     04/30/2019 -  Chemotherapy   Patient is on Treatment Plan : MYELOMA SQ Daratumumab Faspro q28d     04/30/2019 - 07/09/2019 Chemotherapy   The patient had bortezomib SQ (VELCADE) chemo injection 1.75 mg, 1.3 mg/m2 = 1.75 mg, Subcutaneous,  Once, 10 of 11 cycles Administration: 1.75 mg (04/30/2019), 1.75 mg (05/07/2019), 1.75 mg (05/14/2019), 1.75 mg (05/21/2019), 1.75 mg (05/28/2019), 1.75 mg (06/04/2019), 1.75 mg (06/11/2019), 1.75 mg (06/18/2019), 1.75 mg (06/25/2019), 1.75 mg (07/09/2019)   for chemotherapy treatment.       REVIEW OF SYSTEMS:   Constitutional: Denies fevers, chills or abnormal weight loss Eyes: Denies blurriness of vision Ears, nose, mouth, throat, and face: Denies mucositis or sore throat Respiratory: Denies cough, dyspnea or wheezes Cardiovascular: Denies palpitation, chest discomfort Gastrointestinal:  Denies nausea, heartburn or change in bowel habits Skin: Denies  abnormal skin rashes Lymphatics: Denies new lymphadenopathy or easy bruising Neurological:Denies numbness, tingling or new weaknesses Behavioral/Psych: Mood is stable, no new changes  Extremities: No lower extremity edema All other systems were reviewed with the patient and are negative.  I have reviewed the past medical history, past surgical history, social history and family history with the patient and they are unchanged from previous note.  ALLERGIES:  is allergic to augmentin [amoxicillin-pot clavulanate], albuterol, codeine, ibuprofen, nsaids, tolmetin, and tetanus-diphth-acell pertussis.  MEDICATIONS:  Current Outpatient Medications  Medication Sig Dispense Refill   acetaminophen (TYLENOL) 500 MG tablet Take 500-1,000 mg by mouth every 6 (six) hours as needed for mild pain, moderate pain, fever or headache.     acyclovir (ZOVIRAX) 400 MG tablet Take 1 tablet (400 mg total) by mouth 2 (two) times daily. 180 tablet 3   aspirin EC 81 MG tablet Take 81 mg by mouth daily with breakfast.     calcium carbonate (OSCAL) 1500 (600 Ca) MG TABS tablet Take 1,500 mg by mouth daily with breakfast.     Calcium Carbonate 500 MG CHEW Chew 500 mg by mouth as needed for indigestion. (Patient not taking: Reported on 07/06/2021)     Cholecalciferol (VITAMIN D3) 2000 units capsule Take 2,000 Units by mouth daily.      loratadine (CLARITIN) 10 MG tablet Take 10 mg by mouth daily.     Multiple Vitamins-Minerals (CENTRUM SILVER  PO) Take 1 tablet by mouth daily.      omeprazole (PRILOSEC) 20 MG capsule TAKE 1 CAPSULE(20 MG) BY MOUTH DAILY 90 capsule 11   ondansetron (ZOFRAN) 8 MG tablet TAKE 1 TABLET(8 MG) BY MOUTH EVERY 8 HOURS AS NEEDED FOR NAUSEA OR VOMITING 60 tablet 11   pomalidomide (POMALYST) 2 MG capsule Take 1 pill daily for 14 days then rest 7 days for cycle of every 21 days 14 capsule 0   senna-docusate (SENOKOT-S) 8.6-50 MG tablet Take 1-2 tablets by mouth daily as needed for mild constipation or  moderate constipation.     sodium chloride (OCEAN) 0.65 % SOLN nasal spray Place 1 spray into both nostrils as needed for congestion.     vitamin B-12 (CYANOCOBALAMIN) 500 MCG tablet Take 500 mcg by mouth daily.     No current facility-administered medications for this visit.   Facility-Administered Medications Ordered in Other Visits  Medication Dose Route Frequency Provider Last Rate Last Admin   0.9 %  sodium chloride infusion   Intravenous Once Alvy Bimler, Bodey Frizell, MD        PHYSICAL EXAMINATION: ECOG PERFORMANCE STATUS: 1 - Symptomatic but completely ambulatory  LABORATORY DATA:  I have reviewed the data as listed CMP Latest Ref Rng & Units 08/18/2021 07/21/2021 06/23/2021  Glucose 70 - 99 mg/dL 82 84 86  BUN 8 - 23 mg/dL _0 Creatinine 0.44 - 1.00 mg/dL 0.90 0.86 1.18(H)  Sodium 135 - 145 mmol/L 143 142 140  Potassium 3.5 - 5.1 mmol/L 3.1(L) 3.1(L) 3.8  Chloride 98 - 111 mmol/L 109 109 105  CO2 22 - 32 mmol/L _1 Calcium 8.9 - 10.3 mg/dL 9.0 8.8(L) 9.6  Total Protein 6.5 - 8.1 g/dL 6.5 6.2(L) 7.5  Total Bilirubin 0.3 - 1.2 mg/dL 0.7 0.8 0.9  Alkaline Phos 38 - 126 U/L 72 58 68  AST 15 - 41 U/L 14(L) 14(L) 16  ALT 0 - 44 U/L _2 Lab Results  Component Value Date   WBC 3.9 (L) 08/18/2021   HGB 10.4 (L) 08/18/2021   HCT 31.9 (L) 08/18/2021   MCV 85.3 08/18/2021   PLT 186 08/18/2021   NEUTROABS 1.7 08/18/2021   I discussed the assessment and treatment plan with the patient. The patient was provided an opportunity to ask questions and all were answered. The patient agreed with the plan and demonstrated an understanding of the instructions. The patient was advised to call back or seek an in-person evaluation if the symptoms worsen or if the condition fails to improve as anticipated.    I spent 20 minutes for the appointment reviewing test results, discuss management and coordination of care.  [name], MD 08/25/2021 3:02 PM

## 2021-08-25 NOTE — Assessment & Plan Note (Signed)
I have reviewed recent myeloma panel with the patient and her husband She is achieving good response to treatment We will continue the same

## 2021-08-25 NOTE — Assessment & Plan Note (Signed)
I have reviewed documentation and recommendation from neuro oncologist Referral is pending The patient has since stopped taking Florinef and felt a bit better We discussed importance of hydration and close monitoring her blood pressure I anticipate her diastolic blood pressure will improve upon stopping of Florinef

## 2021-09-03 ENCOUNTER — Telehealth: Payer: Self-pay | Admitting: *Deleted

## 2021-09-03 NOTE — Telephone Encounter (Signed)
Routed referral to [facility] Neurology to [phone] for the [facility] Location.

## 2021-09-08 ENCOUNTER — Other Ambulatory Visit: Payer: Self-pay

## 2021-09-08 DIAGNOSIS — C9002 Multiple myeloma in relapse: Secondary | ICD-10-CM

## 2021-09-08 MED ORDER — POMALIDOMIDE 2 MG PO CAPS: ORAL_CAPSULE | ORAL | 0 refills | Status: DC

## 2021-09-15 ENCOUNTER — Encounter: Payer: Self-pay | Admitting: Hematology and Oncology

## 2021-09-15 ENCOUNTER — Inpatient Hospital Stay: Payer: Medicare Other

## 2021-09-15 ENCOUNTER — Other Ambulatory Visit: Payer: Self-pay

## 2021-09-15 ENCOUNTER — Inpatient Hospital Stay: Payer: Medicare Other | Attending: Hematology and Oncology

## 2021-09-15 ENCOUNTER — Inpatient Hospital Stay (HOSPITAL_BASED_OUTPATIENT_CLINIC_OR_DEPARTMENT_OTHER): Payer: Medicare Other | Admitting: Hematology and Oncology

## 2021-09-15 DIAGNOSIS — Z5112 Encounter for antineoplastic immunotherapy: Secondary | ICD-10-CM | POA: Diagnosis not present

## 2021-09-15 DIAGNOSIS — C9002 Multiple myeloma in relapse: Secondary | ICD-10-CM

## 2021-09-15 DIAGNOSIS — Z880 Allergy status to penicillin: Secondary | ICD-10-CM | POA: Diagnosis not present

## 2021-09-15 DIAGNOSIS — R42 Dizziness and giddiness: Secondary | ICD-10-CM | POA: Insufficient documentation

## 2021-09-15 DIAGNOSIS — D61818 Other pancytopenia: Secondary | ICD-10-CM | POA: Insufficient documentation

## 2021-09-15 DIAGNOSIS — C9001 Multiple myeloma in remission: Secondary | ICD-10-CM

## 2021-09-15 DIAGNOSIS — Z881 Allergy status to other antibiotic agents status: Secondary | ICD-10-CM | POA: Diagnosis not present

## 2021-09-15 DIAGNOSIS — Z886 Allergy status to analgesic agent status: Secondary | ICD-10-CM | POA: Diagnosis not present

## 2021-09-15 DIAGNOSIS — Z9481 Bone marrow transplant status: Secondary | ICD-10-CM

## 2021-09-15 DIAGNOSIS — Z79899 Other long term (current) drug therapy: Secondary | ICD-10-CM | POA: Diagnosis not present

## 2021-09-15 DIAGNOSIS — Z887 Allergy status to serum and vaccine status: Secondary | ICD-10-CM | POA: Diagnosis not present

## 2021-09-15 DIAGNOSIS — Z885 Allergy status to narcotic agent status: Secondary | ICD-10-CM | POA: Insufficient documentation

## 2021-09-15 DIAGNOSIS — Z7189 Other specified counseling: Secondary | ICD-10-CM

## 2021-09-15 LAB — COMPREHENSIVE METABOLIC PANEL
ALT: 10 U/L (ref 0–44)
AST: 15 U/L (ref 15–41)
Albumin: 4.4 g/dL (ref 3.5–5.0)
Alkaline Phosphatase: 57 U/L (ref 38–126)
Anion gap: 5 (ref 5–15)
BUN: 12 mg/dL (ref 8–23)
CO2: 26 mmol/L (ref 22–32)
Calcium: 9.7 mg/dL (ref 8.9–10.3)
Chloride: 111 mmol/L (ref 98–111)
Creatinine, Ser: 0.92 mg/dL (ref 0.44–1.00)
GFR, Estimated: >60 mL/min (ref >60)
Glucose, Bld: 83 mg/dL (ref 70–99)
Potassium: 3.6 mmol/L (ref 3.5–5.1)
Sodium: 142 mmol/L (ref 135–145)
Total Bilirubin: 0.7 mg/dL (ref 0.3–1.2)
Total Protein: 6.8 g/dL (ref 6.5–8.1)

## 2021-09-15 LAB — CBC WITH DIFFERENTIAL/PLATELET
Abs Immature Granulocytes: 0 10*3/uL (ref 0.00–0.07)
Basophils Absolute: 0.2 10*3/uL — ABNORMAL HIGH (ref 0.0–0.1)
Basophils Relative: 4 %
Eosinophils Absolute: 0.5 10*3/uL (ref 0.0–0.5)
Eosinophils Relative: 12 %
HCT: 32.7 % — ABNORMAL LOW (ref 36.0–46.0)
Hemoglobin: 10.5 g/dL — ABNORMAL LOW (ref 12.0–15.0)
Immature Granulocytes: 0 %
Lymphocytes Relative: 58 %
Lymphs Abs: 2.3 10*3/uL (ref 0.7–4.0)
MCH: 27.3 pg (ref 26.0–34.0)
MCHC: 32.1 g/dL (ref 30.0–36.0)
MCV: 85.2 fL (ref 80.0–100.0)
Monocytes Absolute: 0.5 10*3/uL (ref 0.1–1.0)
Monocytes Relative: 12 %
Neutro Abs: 0.5 10*3/uL — ABNORMAL LOW (ref 1.7–7.7)
Neutrophils Relative %: 14 %
Platelets: 225 10*3/uL (ref 150–400)
RBC: 3.84 MIL/uL — ABNORMAL LOW (ref 3.87–5.11)
RDW: 18.3 % — ABNORMAL HIGH (ref 11.5–15.5)
WBC: 3.9 10*3/uL — ABNORMAL LOW (ref 4.0–10.5)
nRBC: 0 % (ref 0.0–0.2)

## 2021-09-15 MED ORDER — ACETAMINOPHEN 325 MG PO TABS
650.0000 mg | ORAL_TABLET | Freq: Once | ORAL | Status: AC
  Administered 2021-09-15: 650 mg via ORAL
  Filled 2021-09-15: qty 2

## 2021-09-15 MED ORDER — SODIUM CHLORIDE 0.9 % IV SOLN: Freq: Once | INTRAVENOUS | Status: AC

## 2021-09-15 MED ORDER — CYANOCOBALAMIN 1000 MCG/ML IJ SOLN
1000.0000 ug | Freq: Once | INTRAMUSCULAR | Status: AC
  Administered 2021-09-15: 1000 ug via INTRAMUSCULAR
  Filled 2021-09-15: qty 1

## 2021-09-15 MED ORDER — OMEPRAZOLE 20 MG PO CPDR: DELAYED_RELEASE_CAPSULE | ORAL | 11 refills | Status: DC

## 2021-09-15 MED ORDER — ZOLEDRONIC ACID 4 MG/5ML IV CONC
3.5000 mg | Freq: Once | INTRAVENOUS | Status: AC
  Administered 2021-09-15: 3.5 mg via INTRAVENOUS
  Filled 2021-09-15: qty 4.38

## 2021-09-15 MED ORDER — DARATUMUMAB-HYALURONIDASE-FIHJ 1800-30000 MG-UT/15ML ~~LOC~~ SOLN
1800.0000 mg | Freq: Once | SUBCUTANEOUS | Status: AC
  Administered 2021-09-15: 1800 mg via SUBCUTANEOUS
  Filled 2021-09-15: qty 15

## 2021-09-15 MED ORDER — DEXAMETHASONE 4 MG PO TABS
8.0000 mg | ORAL_TABLET | Freq: Once | ORAL | Status: AC
  Administered 2021-09-15: 8 mg via ORAL
  Filled 2021-09-15: qty 2

## 2021-09-15 MED ORDER — MONTELUKAST SODIUM 10 MG PO TABS
10.0000 mg | ORAL_TABLET | Freq: Once | ORAL | Status: AC
  Administered 2021-09-15: 10 mg via ORAL
  Filled 2021-09-15: qty 1

## 2021-09-15 NOTE — Progress Notes (Signed)
Per Dr. Alvy Bimler, okay to treat today with ANC 0.5.

## 2021-09-15 NOTE — Assessment & Plan Note (Signed)
Her postural dizziness has improved since discontinuation of steroid treatment I recommend she continues follow-up with neurologist

## 2021-09-15 NOTE — Progress Notes (Signed)
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   All questions were answered. The patient knows to call the clinic with any problems, questions or concerns. The total time spent in the appointment was 20 minutes encounter with patients including review of chart and various tests results, discussions about plan of care and coordination of care plan   [name], MD 09/15/2021 9:01 AM  INTERVAL HISTORY: Please see below for problem oriented charting. she returns for treatment follow-up on Pomalyst and daratumumab for recurrent multiple myeloma She felt better since last time I saw her No recent falls No recent infection  REVIEW OF SYSTEMS:   Constitutional: Denies fevers, chills or abnormal weight loss Eyes: Denies blurriness of vision Ears, nose, mouth, throat, and face: Denies mucositis or sore throat Respiratory: Denies cough, dyspnea or wheezes Cardiovascular: Denies palpitation, chest discomfort or lower extremity swelling Gastrointestinal:  Denies nausea, heartburn or change in bowel habits Skin: Denies abnormal skin rashes Lymphatics: Denies new  lymphadenopathy or easy bruising Neurological:Denies numbness, tingling or new weaknesses Behavioral/Psych: Mood is stable, no new changes  All other systems were reviewed with the patient and are negative.  I have reviewed the past medical history, past surgical history, social history and family history with the patient and they are unchanged from previous note.  ALLERGIES:  is allergic to augmentin [amoxicillin-pot clavulanate], albuterol, codeine, ibuprofen, nsaids, tolmetin, and tetanus-diphth-acell pertussis.  MEDICATIONS:  Current Outpatient Medications  Medication Sig Dispense Refill   acetaminophen (TYLENOL) 500 MG tablet Take 500-1,000 mg by mouth every 6 (six) hours as needed for mild pain, moderate pain, fever or headache.     acyclovir (ZOVIRAX) 400 MG tablet Take 1 tablet (400 mg total) by mouth 2 (two) times daily. 180 tablet 3   aspirin EC 81 MG tablet Take 81 mg by mouth daily with breakfast.     calcium carbonate (OSCAL) 1500 (600 Ca) MG TABS tablet Take 1,500 mg by mouth daily with breakfast.     Calcium Carbonate 500 MG CHEW Chew 500 mg by mouth as needed for indigestion. (Patient not taking: Reported on 07/06/2021)     Cholecalciferol (VITAMIN D3) 2000 units capsule Take 2,000 Units by mouth daily.      loratadine (CLARITIN) 10 MG tablet Take 10 mg by mouth daily.     Multiple Vitamins-Minerals (CENTRUM SILVER PO) Take 1 tablet by mouth daily.      omeprazole (PRILOSEC) 20 MG capsule TAKE 1 CAPSULE(20 MG) BY MOUTH DAILY 90 capsule 11   ondansetron (ZOFRAN) 8 MG tablet TAKE 1 TABLET(8 MG) BY MOUTH EVERY 8 HOURS AS NEEDED FOR NAUSEA OR VOMITING 60 tablet 11   pomalidomide (POMALYST) 2 MG capsule Take 1 pill daily for 14 days then rest 7 days for  cycle of every 21 days 14 capsule 0   senna-docusate (SENOKOT-S) 8.6-50 MG tablet Take 1-2 tablets by mouth daily as needed for mild constipation or moderate constipation.     sodium chloride (OCEAN) 0.65 % SOLN nasal spray Place 1  spray into both nostrils as needed for congestion.     vitamin B-12 (CYANOCOBALAMIN) 500 MCG tablet Take 500 mcg by mouth daily.     No current facility-administered medications for this visit.   Facility-Administered Medications Ordered in Other Visits  Medication Dose Route Frequency Provider Last Rate Last Admin   0.9 %  sodium chloride infusion   Intravenous Once Alvy Bimler, Illianna Paschal, MD       acetaminophen (TYLENOL) tablet 650 mg  650 mg Oral Once Alvy Bimler, Nasir Bright, MD       cyanocobalamin ((VITAMIN B-12)) injection 1,000 mcg  1,000 mcg Intramuscular Once Alvy Bimler, Milyn Stapleton, MD       daratumumab-hyaluronidase-fihj (DARZALEX FASPRO) 1800-30000 MG-UT/15ML chemo SQ injection 1,800 mg  1,800 mg Subcutaneous Once Alvy Bimler, Laneah Luft, MD       dexamethasone (DECADRON) tablet 8 mg  8 mg Oral Once Alvy Bimler, Dekari Bures, MD       montelukast (SINGULAIR) tablet 10 mg  10 mg Oral Once [name], MD        SUMMARY OF ONCOLOGIC HISTORY: Oncology History Overview Note   M-protein 0.69 gm/dl IFIX - IgG, Kappa IgG - 868 IgA - 19 IgM - < 20 Kappa - 21 Lambda - 5.7  09/06/2014 - Bone marrow aspirate and biopsy:   Normocellular marrow for age (40%) with a small monoclonal plasma cell population (1% on aspirate). Karyotype 49, XX  FISH Negative for myeloma associated changes  09/12/2014 - PET/CT  Two regions that are concerning for disease, one adjacent/involving the left ninth rib and one in the marrow of the right femur, in this patient with history of plasmacytoma.    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   04/01/2015 Bone Marrow Transplant   She received melphalan chemotherapy on 03/31/2015 followed by autologous stem cell transplant the day after   04/03/2015 - 04/18/2015 Hospital Admission   The patient was admitted to the hospital at [place] for management related to complication from stem cell transplant. She had significant nausea requiring intravenous anti-emetics.   07/17/2015 -  Chemotherapy   She started maintenance Revlimid and monthly zometa, then every 3 months   06/01/2018 Imaging   DEXA scan showed bone density T score  in femur -2.3   04/20/2019 PET scan   1. No FDG avid osseous lesions or mass identified to suggest metabolically active lesion of myeloma or plasmacytoma. 2. Small nodular density within the paravertebral right lower lobe exhibits mild to moderate increased uptake within SUV max of 3.38. This is indeterminate. Review of CT chest from 10/05/2018 shows a corresponding Lung nodule in this area measuring the same. Small pulmonary neoplasm cannot be excluded. 3. Indeterminate, focal area of increased uptake is identified within the thoracic canal. Indeterminate favored to represent benign physiologic CNS activity.     04/30/2019 -  Chemotherapy   Patient is on Treatment Plan : MYELOMA SQ Daratumumab Faspro q28d     04/30/2019 - 07/09/2019 Chemotherapy   The patient had bortezomib SQ (VELCADE) chemo injection 1.75 mg, 1.3 mg/m2 = 1.75 mg,  Subcutaneous,  Once, 10 of 11 cycles Administration: 1.75 mg (04/30/2019), 1.75 mg (05/07/2019), 1.75 mg (05/14/2019), 1.75 mg (05/21/2019), 1.75 mg (05/28/2019), 1.75 mg (06/04/2019), 1.75 mg (06/11/2019), 1.75 mg (06/18/2019), 1.75 mg (06/25/2019), 1.75 mg (07/09/2019)   for chemotherapy treatment.       PHYSICAL EXAMINATION: ECOG PERFORMANCE STATUS: 1 - Symptomatic but completely ambulatory  Vitals:   09/15/21 0837  BP: (!) 148/77  Pulse: 70  Resp: 18  Temp: 98.4 F (36.9 C)  SpO2: 100%   Filed Weights   09/15/21 0837  Weight: 112 lb 6.4 oz (51 kg)    GENERAL:alert, no distress and comfortable NEURO: alert & oriented x 3 with fluent speech, no focal motor/sensory deficits  LABORATORY DATA:  I have reviewed the data as listed    Component Value Date/Time   NA 142 09/15/2021 0758   NA 143 07/14/2017 1245   K 3.6 09/15/2021 0758   K 3.3 (L) 07/14/2017 1245   CL 111 09/15/2021 0758   CO2 26 09/15/2021 0758   CO2 20 (L) 07/14/2017 1245   GLUCOSE 83 09/15/2021 0758   GLUCOSE 106 07/14/2017 1245   BUN 12 09/15/2021 0758   BUN 9.3 07/14/2017 1245   CREATININE 0.92 09/15/2021 0758   CREATININE 1.01 (H) 01/27/2021 0847   CREATININE 0.8 07/14/2017 1245   CALCIUM 9.7 09/15/2021 0758   CALCIUM 8.4 07/14/2017 1245   PROT 6.8 09/15/2021 0758   PROT 6.0 07/14/2017 1245   PROT 6.2 (L) 07/14/2017 1245   ALBUMIN 4.4 09/15/2021 0758   ALBUMIN 3.6 07/14/2017 1245   AST 15 09/15/2021 0758   AST 21 01/27/2021 0847   AST 21 07/14/2017 1245   ALT 10 09/15/2021 0758   ALT 14 01/27/2021 0847   ALT 21 07/14/2017 1245   ALKPHOS 57 09/15/2021 0758   ALKPHOS 78 07/14/2017 1245   BILITOT 0.7 09/15/2021 0758   BILITOT 0.6 01/27/2021 0847   BILITOT 0.43 07/14/2017 1245   GFRNONAA >60 09/15/2021 0758   GFRNONAA 60 (L) 01/27/2021 0847   GFRAA 57 (L) 04/21/2020 0817   GFRAA >60 01/14/2020 0828    No results found for: SPEP, UPEP  Lab Results  Component Value Date   WBC 3.9 (L)  09/15/2021   NEUTROABS 0.5 (L) 09/15/2021   HGB 10.5 (L) 09/15/2021   HCT 32.7 (L) 09/15/2021   MCV 85.2 09/15/2021   PLT 225 09/15/2021      Chemistry      Component Value Date/Time   NA 142 09/15/2021 0758   NA 143 07/14/2017 1245   K 3.6 09/15/2021 0758   K 3.3 (L) 07/14/2017 1245   CL 111 09/15/2021 0758  CO2 26 09/15/2021 0758   CO2 20 (L) 07/14/2017 1245   BUN 12 09/15/2021 0758   BUN 9.3 07/14/2017 1245   CREATININE 0.92 09/15/2021 0758   CREATININE 1.01 (H) 01/27/2021 0847   CREATININE 0.8 07/14/2017 1245      Component Value Date/Time   CALCIUM 9.7 09/15/2021 0758   CALCIUM 8.4 07/14/2017 1245   ALKPHOS 57 09/15/2021 0758   ALKPHOS 78 07/14/2017 1245   AST 15 09/15/2021 0758   AST 21 01/27/2021 0847   AST 21 07/14/2017 1245   ALT 10 09/15/2021 0758   ALT 14 01/27/2021 0847   ALT 21 07/14/2017 1245   BILITOT 0.7 09/15/2021 0758   BILITOT 0.6 01/27/2021 0847   BILITOT 0.43 07/14/2017 1245

## 2021-09-15 NOTE — Assessment & Plan Note (Signed)
I have reviewed recent myeloma panel with the patient  She is achieving good response to treatment We will continue the same

## 2021-09-15 NOTE — Patient Instructions (Addendum)
[facility] ONCOLOGY  Discharge Instructions: Thank you for choosing [facility] to provide your oncology and hematology care.   If you have a lab appointment with the [facility], please go directly to the [facility] and check in at the registration area.   Wear comfortable clothing and clothing appropriate for easy access to any Portacath or PICC line.   We strive to give you quality time with your provider. You may need to reschedule your appointment if you arrive late (15 or more minutes).  Arriving late affects you and other patients whose appointments are after yours.  Also, if you miss three or more appointments without notifying the office, you may be dismissed from the clinic at the providers discretion.      For prescription refill requests, have your pharmacy contact our office and allow 72 hours for refills to be completed.    Today you received the following chemotherapy and/or immunotherapy agents: Darzalex Faspro.      To help prevent nausea and vomiting after your treatment, we encourage you to take your nausea medication as directed.  BELOW ARE SYMPTOMS THAT SHOULD BE REPORTED IMMEDIATELY: *FEVER GREATER THAN 100.4 F (38 C) OR HIGHER *CHILLS OR SWEATING *NAUSEA AND VOMITING THAT IS NOT CONTROLLED WITH YOUR NAUSEA MEDICATION *UNUSUAL SHORTNESS OF BREATH *UNUSUAL BRUISING OR BLEEDING *URINARY PROBLEMS (pain or burning when urinating, or frequent urination) *BOWEL PROBLEMS (unusual diarrhea, constipation, pain near the anus) TENDERNESS IN MOUTH AND THROAT WITH OR WITHOUT PRESENCE OF ULCERS (sore throat, sores in mouth, or a toothache) UNUSUAL RASH, SWELLING OR PAIN  UNUSUAL VAGINAL DISCHARGE OR ITCHING   Items with * indicate a potential emergency and should be followed up as soon as possible or go to the Emergency Department if any problems should occur.  Please show the CHEMOTHERAPY ALERT CARD or IMMUNOTHERAPY ALERT CARD at  check-in to the Emergency Department and triage nurse.  Should you have questions after your visit or need to cancel or reschedule your appointment, please contact [facility]  Dept: [phone]  and follow the prompts.  Office hours are 8:00 a.m. to 4:30 p.m. Monday - Friday. Please note that voicemails left after 4:00 p.m. may not be returned until the following business day.  We are closed weekends and major holidays. You have access to a nurse at all times for urgent questions. Please call the main number to the clinic Dept: [phone] and follow the prompts.   For any non-urgent questions, you may also contact your provider using MyChart. We now offer e-Visits for anyone 21 and older to request care online for non-urgent symptoms. For details visit [email or web address].   Also download the MyChart app! Go to the app store, search "MyChart", open the app, select [facility], and log in with your MyChart username and password.  Due to Covid, a mask is required upon entering the hospital/clinic. If you do not have a mask, one will be given to you upon arrival. For doctor visits, patients may have 1 support person aged 100 or older with them. For treatment visits, patients cannot have anyone with them due to current Covid guidelines and our immunocompromised population.   Zoledronic Acid Injection (Hypercalcemia, Oncology) What is this medication? ZOLEDRONIC ACID (ZOE le dron ik AS id) slows calcium loss from bones. It high calcium levels in the blood from some kinds of cancer. It may be used in other people at risk for bone loss. This medicine  may be used for other purposes; ask your health care provider or pharmacist if you have questions. COMMON BRAND NAME(S): Zometa What should I tell my care team before I take this medication? They need to know if you have any of these conditions: cancer dehydration dental disease kidney disease liver disease low levels  of calcium in the blood lung or breathing disease (asthma) receiving steroids like dexamethasone or prednisone an unusual or allergic reaction to zoledronic acid, other medicines, foods, dyes, or preservatives pregnant or trying to get pregnant breast-feeding How should I use this medication? This drug is injected into a vein. It is given by a health care provider in a hospital or clinic setting. Talk to your health care provider about the use of this drug in children. Special care may be needed. Overdosage: If you think you have taken too much of this medicine contact a poison control center or emergency room at once. NOTE: This medicine is only for you. Do not share this medicine with others. What if I miss a dose? Keep appointments for follow-up doses. It is important not to miss your dose. Call your health care provider if you are unable to keep an appointment. What may interact with this medication? certain antibiotics given by injection NSAIDs, medicines for pain and inflammation, like ibuprofen or naproxen some diuretics like bumetanide, furosemide teriparatide thalidomide This list may not describe all possible interactions. Give your health care provider a list of all the medicines, herbs, non-prescription drugs, or dietary supplements you use. Also tell them if you smoke, drink alcohol, or use illegal drugs. Some items may interact with your medicine. What should I watch for while using this medication? Visit your health care provider for regular checks on your progress. It may be some time before you see the benefit from this drug. Some people who take this drug have severe bone, joint, or muscle pain. This drug may also increase your risk for jaw problems or a broken thigh bone. Tell your health care provider right away if you have severe pain in your jaw, bones, joints, or muscles. Tell you health care provider if you have any pain that does not go away or that gets worse. Tell  your dentist and dental surgeon that you are taking this drug. You should not have major dental surgery while on this drug. See your dentist to have a dental exam and fix any dental problems before starting this drug. Take good care of your teeth while on this drug. Make sure you see your dentist for regular follow-up appointments. You should make sure you get enough calcium and vitamin D while you are taking this drug. Discuss the foods you eat and the vitamins you take with your health care provider. Check with your health care provider if you have severe diarrhea, nausea, and vomiting, or if you sweat a lot. The loss of too much body fluid may make it dangerous for you to take this drug. You may need blood work done while you are taking this drug. Do not become pregnant while taking this drug. Women should inform their health care provider if they wish to become pregnant or think they might be pregnant. There is potential for serious harm to an unborn child. Talk to your health care provider for more information. What side effects may I notice from receiving this medication? Side effects that you should report to your doctor or health care provider as soon as possible: allergic reactions (skin rash, itching or  hives; swelling of the face, lips, or tongue) bone pain infection (fever, chills, cough, sore throat, pain or trouble passing urine) jaw pain, especially after dental work joint pain kidney injury (trouble passing urine or change in the amount of urine) low blood pressure (dizziness; feeling faint or lightheaded, falls; unusually weak or tired) low calcium levels (fast heartbeat; muscle cramps or pain; pain, tingling, or numbness in the hands or feet; seizures) low magnesium levels (fast, irregular heartbeat; muscle cramp or pain; muscle weakness; tremors; seizures) low red blood cell counts (trouble breathing; feeling faint; lightheaded, falls; unusually weak or tired) muscle  pain redness, blistering, peeling, or loosening of the skin, including inside the mouth severe diarrhea swelling of the ankles, feet, hands trouble breathing Side effects that usually do not require medical attention (report to your doctor or health care provider if they continue or are bothersome): anxious constipation coughing depressed mood eye irritation, itching, or pain fever general ill feeling or flu-like symptoms nausea pain, redness, or irritation at site where injected trouble sleeping This list may not describe all possible side effects. Call your doctor for medical advice about side effects. You may report side effects to FDA at 1-800-FDA-1088. Where should I keep my medication? This drug is given in a hospital or clinic. It will not be stored at home. NOTE: This sheet is a summary. It may not cover all possible information. If you have questions about this medicine, talk to your doctor, pharmacist, or health care provider.  2022 Elsevier/Gold Standard (2021-04-14 00:00:00)  Vitamin B12 Injection What is this medication? Vitamin B12 (VAHY tuh min B12) prevents and treats low vitamin B12 levels in your body. It is used in people who do not get enough vitamin B12 from their diet or when their digestive tract does not absorb enough. Vitamin B12 plays an important role in maintaining the health of your nervous system and red blood cells. This medicine may be used for other purposes; ask your health care provider or pharmacist if you have questions. COMMON BRAND NAME(S): B-12 Compliance Kit, B-12 Injection Kit, Cyomin, Dodex, LA-12, Nutri-Twelve, Physicians EZ Use B-12, Primabalt What should I tell my care team before I take this medication? They need to know if you have any of these conditions: Kidney disease Leber's disease Megaloblastic anemia An unusual or allergic reaction to cyanocobalamin, cobalt, other medications, foods, dyes, or preservatives Pregnant or trying to  get pregnant Breast-feeding How should I use this medication? This medication is injected into a muscle or deeply under the skin. It is usually given in a clinic or care team's office. However, your care team may teach you how to inject yourself. Follow all instructions. Talk to your care team about the use of this medication in children. Special care may be needed. Overdosage: If you think you have taken too much of this medicine contact a poison control center or emergency room at once. NOTE: This medicine is only for you. Do not share this medicine with others. What if I miss a dose? If you are given your dose at a clinic or care team's office, call to reschedule your appointment. If you give your own injections, and you miss a dose, take it as soon as you can. If it is almost time for your next dose, take only that dose. Do not take double or extra doses. What may interact with this medication? Colchicine Heavy alcohol intake This list may not describe all possible interactions. Give your health care provider a list  of all the medicines, herbs, non-prescription drugs, or dietary supplements you use. Also tell them if you smoke, drink alcohol, or use illegal drugs. Some items may interact with your medicine. What should I watch for while using this medication? Visit your care team regularly. You may need blood work done while you are taking this medication. You may need to follow a special diet. Talk to your care team. Limit your alcohol intake and avoid smoking to get the best benefit. What side effects may I notice from receiving this medication? Side effects that you should report to your care team as soon as possible: Allergic reactions--skin rash, itching, hives, swelling of the face, lips, tongue, or throat Swelling of the ankles, hands, or feet Trouble breathing Side effects that usually do not require medical attention (report to your care team if they continue or are  bothersome): Diarrhea This list may not describe all possible side effects. Call your doctor for medical advice about side effects. You may report side effects to FDA at 1-800-FDA-1088. Where should I keep my medication? Keep out of the reach of children. Store at room temperature between 15 and 30 degrees C (59 and 85 degrees F). Protect from light. Throw away any unused medication after the expiration date. NOTE: This sheet is a summary. It may not cover all possible information. If you have questions about this medicine, talk to your doctor, pharmacist, or health care provider.  2022 Elsevier/Gold Standard (2020-10-08 00:00:00)

## 2021-09-15 NOTE — Assessment & Plan Note (Signed)
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

## 2021-09-16 LAB — KAPPA/LAMBDA LIGHT CHAINS
Kappa free light chain: 32.5 mg/L — ABNORMAL HIGH (ref 3.3–19.4)
Kappa, lambda light chain ratio: 7.93 — ABNORMAL HIGH (ref 0.26–1.65)
Lambda free light chains: 4.1 mg/L — ABNORMAL LOW (ref 5.7–26.3)

## 2021-09-21 LAB — MULTIPLE MYELOMA PANEL, SERUM
Albumin SerPl Elph-Mcnc: 3.7 g/dL (ref 2.9–4.4)
Albumin/Glob SerPl: 1.5 (ref 0.7–1.7)
Alpha 1: 0.2 g/dL (ref 0.0–0.4)
Alpha2 Glob SerPl Elph-Mcnc: 0.7 g/dL (ref 0.4–1.0)
B-Globulin SerPl Elph-Mcnc: 1.1 g/dL (ref 0.7–1.3)
Gamma Glob SerPl Elph-Mcnc: 0.6 g/dL (ref 0.4–1.8)
Globulin, Total: 2.6 g/dL (ref 2.2–3.9)
IgA: 23 mg/dL — ABNORMAL LOW (ref 64–422)
IgG (Immunoglobin G), Serum: 645 mg/dL (ref 586–1602)
IgM (Immunoglobulin M), Srm: 6 mg/dL — ABNORMAL LOW (ref 26–217)
M Protein SerPl Elph-Mcnc: 0.4 g/dL — ABNORMAL HIGH
Total Protein ELP: 6.3 g/dL (ref 6.0–8.5)

## 2021-09-29 ENCOUNTER — Other Ambulatory Visit: Payer: Self-pay

## 2021-09-29 DIAGNOSIS — C9002 Multiple myeloma in relapse: Secondary | ICD-10-CM

## 2021-09-29 MED ORDER — POMALIDOMIDE 2 MG PO CAPS: ORAL_CAPSULE | ORAL | 0 refills | Status: DC

## 2021-10-09 ENCOUNTER — Other Ambulatory Visit: Payer: Self-pay

## 2021-10-09 DIAGNOSIS — C9002 Multiple myeloma in relapse: Secondary | ICD-10-CM

## 2021-10-09 MED ORDER — POMALIDOMIDE 2 MG PO CAPS: ORAL_CAPSULE | ORAL | 0 refills | Status: DC

## 2021-10-12 ENCOUNTER — Inpatient Hospital Stay: Payer: Medicare Other

## 2021-10-12 ENCOUNTER — Telehealth: Payer: Self-pay

## 2021-10-12 ENCOUNTER — Inpatient Hospital Stay: Payer: Medicare Other | Admitting: Hematology and Oncology

## 2021-10-12 NOTE — Telephone Encounter (Signed)
Returned her call. She is canceling today's appts. Appts canceled. She is not feeling well and has a sore throat/ ear pain. This started on Friday and has got worse over the weekend. ?Denies fever and is taking OTC cold medication. ? ?She will call PCP to get appt or go to urgent care. She will call the office back in the am with update. ?

## 2021-10-22 ENCOUNTER — Telehealth: Payer: Self-pay

## 2021-10-22 ENCOUNTER — Other Ambulatory Visit: Payer: Self-pay | Admitting: Hematology and Oncology

## 2021-10-22 NOTE — Telephone Encounter (Signed)
I sent LOS 

## 2021-10-22 NOTE — Telephone Encounter (Signed)
Called and given below message. She is feeling better and was going to call today. Her husband has been having health issues. ?She is ready for appts. ?

## 2021-10-22 NOTE — Telephone Encounter (Signed)
-----   Message from [name], MD sent at 10/22/2021  9:20 AM EDT ----- ?She has not been seen for over 1 month ?Can you call her or husband what is going on? ? ?

## 2021-10-27 ENCOUNTER — Other Ambulatory Visit: Payer: Self-pay

## 2021-10-27 DIAGNOSIS — C9002 Multiple myeloma in relapse: Secondary | ICD-10-CM

## 2021-10-29 ENCOUNTER — Other Ambulatory Visit: Payer: Self-pay

## 2021-10-29 ENCOUNTER — Inpatient Hospital Stay (HOSPITAL_BASED_OUTPATIENT_CLINIC_OR_DEPARTMENT_OTHER): Payer: Medicare Other | Admitting: Hematology and Oncology

## 2021-10-29 ENCOUNTER — Ambulatory Visit: Payer: Medicare Other

## 2021-10-29 ENCOUNTER — Inpatient Hospital Stay: Payer: Medicare Other

## 2021-10-29 ENCOUNTER — Inpatient Hospital Stay: Payer: Medicare Other | Attending: Hematology and Oncology

## 2021-10-29 VITALS — BP 137/75

## 2021-10-29 VITALS — BP 128/93 | HR 72 | Temp 98.6°F | Resp 18 | Ht 60.0 in | Wt 108.6 lb

## 2021-10-29 DIAGNOSIS — Z5112 Encounter for antineoplastic immunotherapy: Secondary | ICD-10-CM | POA: Diagnosis not present

## 2021-10-29 DIAGNOSIS — R7989 Other specified abnormal findings of blood chemistry: Secondary | ICD-10-CM | POA: Diagnosis not present

## 2021-10-29 DIAGNOSIS — N183 Chronic kidney disease, stage 3 unspecified: Secondary | ICD-10-CM | POA: Diagnosis not present

## 2021-10-29 DIAGNOSIS — Z7961 Long term (current) use of immunomodulator: Secondary | ICD-10-CM | POA: Diagnosis not present

## 2021-10-29 DIAGNOSIS — Z880 Allergy status to penicillin: Secondary | ICD-10-CM | POA: Insufficient documentation

## 2021-10-29 DIAGNOSIS — C9001 Multiple myeloma in remission: Secondary | ICD-10-CM

## 2021-10-29 DIAGNOSIS — Z885 Allergy status to narcotic agent status: Secondary | ICD-10-CM | POA: Insufficient documentation

## 2021-10-29 DIAGNOSIS — D61818 Other pancytopenia: Secondary | ICD-10-CM

## 2021-10-29 DIAGNOSIS — R42 Dizziness and giddiness: Secondary | ICD-10-CM | POA: Diagnosis not present

## 2021-10-29 DIAGNOSIS — Z881 Allergy status to other antibiotic agents status: Secondary | ICD-10-CM | POA: Diagnosis not present

## 2021-10-29 DIAGNOSIS — Z7189 Other specified counseling: Secondary | ICD-10-CM

## 2021-10-29 DIAGNOSIS — Z887 Allergy status to serum and vaccine status: Secondary | ICD-10-CM | POA: Diagnosis not present

## 2021-10-29 DIAGNOSIS — C9002 Multiple myeloma in relapse: Secondary | ICD-10-CM

## 2021-10-29 DIAGNOSIS — Z79899 Other long term (current) drug therapy: Secondary | ICD-10-CM | POA: Diagnosis not present

## 2021-10-29 DIAGNOSIS — Z886 Allergy status to analgesic agent status: Secondary | ICD-10-CM | POA: Insufficient documentation

## 2021-10-29 LAB — CBC WITH DIFFERENTIAL (CANCER CENTER ONLY)
Abs Immature Granulocytes: 0.01 10*3/uL (ref 0.00–0.07)
Basophils Absolute: 0.2 10*3/uL — ABNORMAL HIGH (ref 0.0–0.1)
Basophils Relative: 3 %
Eosinophils Absolute: 0.8 10*3/uL — ABNORMAL HIGH (ref 0.0–0.5)
Eosinophils Relative: 15 %
HCT: 32.6 % — ABNORMAL LOW (ref 36.0–46.0)
Hemoglobin: 10.6 g/dL — ABNORMAL LOW (ref 12.0–15.0)
Immature Granulocytes: 0 %
Lymphocytes Relative: 54 %
Lymphs Abs: 2.8 10*3/uL (ref 0.7–4.0)
MCH: 28 pg (ref 26.0–34.0)
MCHC: 32.5 g/dL (ref 30.0–36.0)
MCV: 86 fL (ref 80.0–100.0)
Monocytes Absolute: 0.6 10*3/uL (ref 0.1–1.0)
Monocytes Relative: 12 %
Neutro Abs: 0.8 10*3/uL — ABNORMAL LOW (ref 1.7–7.7)
Neutrophils Relative %: 16 %
Platelet Count: 294 10*3/uL (ref 150–400)
RBC: 3.79 MIL/uL — ABNORMAL LOW (ref 3.87–5.11)
RDW: 19.5 % — ABNORMAL HIGH (ref 11.5–15.5)
WBC Count: 5.1 10*3/uL (ref 4.0–10.5)
nRBC: 0 % (ref 0.0–0.2)

## 2021-10-29 LAB — COMPREHENSIVE METABOLIC PANEL
ALT: 10 U/L (ref 0–44)
AST: 15 U/L (ref 15–41)
Albumin: 4.2 g/dL (ref 3.5–5.0)
Alkaline Phosphatase: 62 U/L (ref 38–126)
Anion gap: 6 (ref 5–15)
BUN: 16 mg/dL (ref 8–23)
CO2: 25 mmol/L (ref 22–32)
Calcium: 9.3 mg/dL (ref 8.9–10.3)
Chloride: 108 mmol/L (ref 98–111)
Creatinine, Ser: 1.05 mg/dL — ABNORMAL HIGH (ref 0.44–1.00)
GFR, Estimated: 56 mL/min — ABNORMAL LOW (ref >60)
Glucose, Bld: 90 mg/dL (ref 70–99)
Potassium: 3.7 mmol/L (ref 3.5–5.1)
Sodium: 139 mmol/L (ref 135–145)
Total Bilirubin: 0.5 mg/dL (ref 0.3–1.2)
Total Protein: 6.8 g/dL (ref 6.5–8.1)

## 2021-10-29 MED ORDER — SODIUM CHLORIDE 0.9 % IV SOLN: Freq: Once | INTRAVENOUS | Status: DC

## 2021-10-29 MED ORDER — MONTELUKAST SODIUM 10 MG PO TABS
10.0000 mg | ORAL_TABLET | Freq: Once | ORAL | Status: AC
  Administered 2021-10-29: 10 mg via ORAL
  Filled 2021-10-29: qty 1

## 2021-10-29 MED ORDER — DEXAMETHASONE 4 MG PO TABS
8.0000 mg | ORAL_TABLET | Freq: Once | ORAL | Status: AC
  Administered 2021-10-29: 8 mg via ORAL
  Filled 2021-10-29: qty 2

## 2021-10-29 MED ORDER — ACETAMINOPHEN 325 MG PO TABS
650.0000 mg | ORAL_TABLET | Freq: Once | ORAL | Status: AC
  Administered 2021-10-29: 650 mg via ORAL
  Filled 2021-10-29: qty 2

## 2021-10-29 MED ORDER — DARATUMUMAB-HYALURONIDASE-FIHJ 1800-30000 MG-UT/15ML ~~LOC~~ SOLN
1800.0000 mg | Freq: Once | SUBCUTANEOUS | Status: AC
  Administered 2021-10-29: 1800 mg via SUBCUTANEOUS
  Filled 2021-10-29: qty 15

## 2021-10-29 NOTE — Progress Notes (Signed)
OK to trt w/ ANC 0.8 today per Dr. Alvy Bimler ?

## 2021-10-29 NOTE — Patient Instructions (Signed)
[facility]  Discharge Instructions: Thank you for choosing [facility] to provide your oncology and hematology care.   If you have a lab appointment with the Cancer Center, please go directly to the Cancer Center and check in at the registration area.   Wear comfortable clothing and clothing appropriate for easy access to any Portacath or PICC line.   We strive to give you quality time with your provider. You may need to reschedule your appointment if you arrive late (15 or more minutes).  Arriving late affects you and other patients whose appointments are after yours.  Also, if you miss three or more appointments without notifying the office, you may be dismissed from the clinic at the provider's discretion.      For prescription refill requests, have your pharmacy contact our office and allow 72 hours for refills to be completed.    Today you received the following chemotherapy and/or immunotherapy agents: Darzalex Faspro      To help prevent nausea and vomiting after your treatment, we encourage you to take your nausea medication as directed.  BELOW ARE SYMPTOMS THAT SHOULD BE REPORTED IMMEDIATELY: *FEVER GREATER THAN 100.4 F (38 C) OR HIGHER *CHILLS OR SWEATING *NAUSEA AND VOMITING THAT IS NOT CONTROLLED WITH YOUR NAUSEA MEDICATION *UNUSUAL SHORTNESS OF BREATH *UNUSUAL BRUISING OR BLEEDING *URINARY PROBLEMS (pain or burning when urinating, or frequent urination) *BOWEL PROBLEMS (unusual diarrhea, constipation, pain near the anus) TENDERNESS IN MOUTH AND THROAT WITH OR WITHOUT PRESENCE OF ULCERS (sore throat, sores in mouth, or a toothache) UNUSUAL RASH, SWELLING OR PAIN  UNUSUAL VAGINAL DISCHARGE OR ITCHING   Items with * indicate a potential emergency and should be followed up as soon as possible or go to the Emergency Department if any problems should occur.  Please show the CHEMOTHERAPY ALERT CARD or IMMUNOTHERAPY ALERT CARD at  check-in to the Emergency Department and triage nurse.  Should you have questions after your visit or need to cancel or reschedule your appointment, please contact [facility]  Dept: [phone]  and follow the prompts.  Office hours are 8:00 a.m. to 4:30 p.m. Monday - Friday. Please note that voicemails left after 4:00 p.m. may not be returned until the following business day.  We are closed weekends and major holidays. You have access to a nurse at all times for urgent questions. Please call the main number to the clinic Dept: [phone] and follow the prompts.   For any non-urgent questions, you may also contact your provider using MyChart. We now offer e-Visits for anyone 18 and older to request care online for non-urgent symptoms. For details visit [email or web address].   Also download the MyChart app! Go to the app store, search "MyChart", open the app, select [facility], and log in with your MyChart username and password.  Due to Covid, a mask is required upon entering the hospital/clinic. If you do not have a mask, one will be given to you upon arrival. For doctor visits, patients may have 1 support person aged 18 or older with them. For treatment visits, patients cannot have anyone with them due to current Covid guidelines and our immunocompromised population.  

## 2021-10-30 ENCOUNTER — Encounter: Payer: Self-pay | Admitting: Hematology and Oncology

## 2021-10-30 NOTE — Progress Notes (Signed)
[name] ?OFFICE PROGRESS NOTE ? ?Patient Care Team: ?Jessica Huddle, MD as PCP - General (Internal Medicine) ?Jessica Dresser, MD as PCP - Cardiology (Cardiology) ?Jessica Pitman, MD as Consulting Physician (Hematology and Oncology) ?Jessica Dibble, MD as Consulting Physician (Hematology and Oncology) ? ?ASSESSMENT & PLAN:  ?Multiple myeloma in relapse ([place]) ?She took a treatment break due to her husband's health issues ?She is doing well ?Despite mild pancytopenia, she tolerated treatment well ?We will continue current prescribed treatment ?I will see her again in a month for further follow-up ? ?Chronic kidney disease, stage III (moderate) ?She has intermittent elevated serum creatinine ?We discussed importance of adequate hydration ? 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 ?Orders Placed This Encounter  ?Procedures  ? CBC with Differential/Platelet  ?  Standing Status:   Standing  ?  Number of Occurrences:   68  ?  Standing Expiration Date:   10/31/2022  ? Comprehensive metabolic panel  ?  Standing Status:   Standing  ?  Number of Occurrences:   46  ?  Standing Expiration Date:   10/31/2022  ? Kappa/lambda light chains  ?  Standing Status:   Standing  ?  Number of Occurrences:   3  ?  Standing Expiration Date:   10/31/2022  ? Multiple Myeloma Panel (SPEP&IFE w/QIG)  ?  Standing Status:   Standing  ?  Number of Occurrences:   46  ?  Standing Expiration Date:   10/31/2022  ? ? ?All questions were answered. The patient knows to call the clinic with any problems, questions or concerns. ?The total time spent in the appointment was 25 minutes encounter with patients including review of chart and various tests results, discussions about plan of care and coordination of care plan ?  ?Jessica Lark, MD ?10/30/2021 9:05 AM ? ?INTERVAL HISTORY: ?Please see below for problem oriented  charting. ?she returns for treatment follow-up on Pomalyst, dexamethasone and daratumumab ?She missed her appointment recently as her husband has health issues ?Otherwise she is doing well ?No recent falls ?No recent infection, fever or chills ?She continues to have chronic dizziness ? ?REVIEW OF SYSTEMS:   ?Constitutional: Denies fevers, chills or abnormal weight loss ?Eyes: Denies blurriness of vision ?Ears, nose, mouth, throat, and face: Denies mucositis or sore throat ?Respiratory: Denies cough, dyspnea or wheezes ?Cardiovascular: Denies palpitation, chest discomfort or lower extremity swelling ?Gastrointestinal:  Denies nausea, heartburn or change in bowel habits ?Skin: Denies abnormal skin rashes ?Lymphatics: Denies new lymphadenopathy or easy bruising ?Neurological:Denies numbness, tingling or new weaknesses ?Behavioral/Psych: Mood is stable, no new changes  ?All other systems were reviewed with the patient and are negative. ? ?I have reviewed the past medical history, past surgical history, social history and family history with the patient and they are unchanged from previous note. ? ?ALLERGIES:  is allergic to augmentin [amoxicillin-pot clavulanate], albuterol, codeine, ibuprofen, nsaids, tolmetin, and tetanus-diphth-acell pertussis. ? ?MEDICATIONS:  ?Current Outpatient Medications  ?Medication Sig Dispense Refill  ? acetaminophen (TYLENOL) 500 MG tablet Take 500-1,000 mg by mouth every 6 (six) hours as needed for mild pain, moderate pain, fever or headache.    ? acyclovir (ZOVIRAX) 400 MG tablet Take 1 tablet (400 mg total) by mouth 2 (two) times daily. 180 tablet 3  ? aspirin EC 81 MG tablet Take 81 mg by mouth daily with breakfast.    ? calcium  carbonate (OSCAL) 1500 (600 Ca) MG TABS tablet Take 1,500 mg by mouth daily with breakfast.    ? Calcium Carbonate 500 MG CHEW Chew 500 mg by mouth as needed for indigestion. (Patient not taking: Reported on 07/06/2021)    ? Cholecalciferol (VITAMIN D3) 2000  units capsule Take 2,000 Units by mouth daily.     ? loratadine (CLARITIN) 10 MG tablet Take 10 mg by mouth daily.    ? Multiple Vitamins-Minerals (CENTRUM SILVER PO) Take 1 tablet by mouth daily.     ? omeprazole (PRILOSEC) 20 MG capsule TAKE 1 CAPSULE(20 MG) BY MOUTH DAILY 90 capsule 11  ? ondansetron (ZOFRAN) 8 MG tablet TAKE 1 TABLET(8 MG) BY MOUTH EVERY 8 HOURS AS NEEDED FOR NAUSEA OR VOMITING 60 tablet 11  ? pomalidomide (POMALYST) 2 MG capsule Take 1 pill daily for 14 days then rest 7 days for cycle of every 21 days 14 capsule 0  ? senna-docusate (SENOKOT-S) 8.6-50 MG tablet Take 1-2 tablets by mouth daily as needed for mild constipation or moderate constipation.    ? sodium chloride (OCEAN) 0.65 % SOLN nasal spray Place 1 spray into both nostrils as needed for congestion.    ? vitamin B-12 (CYANOCOBALAMIN) 500 MCG tablet Take 500 mcg by mouth daily.    ? ?No current facility-administered medications for this visit.  ? ?Facility-Administered Medications Ordered in Other Visits  ?Medication Dose Route Frequency Provider Last Rate Last Admin  ? 0.9 %  sodium chloride infusion   Intravenous Once Jessica Lark, MD      ? ? ?SUMMARY OF ONCOLOGIC HISTORY: ?Oncology History Overview Note  ?? M-protein 0.69 gm/dl IFIX - IgG, Kappa IgG - 868 IgA - 19 IgM - < 20 Kappa - 21 Lambda - 5.7 ? ?09/06/2014 - Bone marrow aspirate and biopsy:  ?? Normocellular marrow for age (40%) with a small monoclonal plasma cell population (1% on aspirate). Karyotype 63, XX ?? FISH Negative for myeloma associated changes ?? 09/12/2014 - PET/CT ?? Two regions that are concerning for disease, one adjacent/involving the left ninth rib and one in the marrow of the right femur, in this patient with history of plasmacytoma.  ?  ?Multiple myeloma in relapse [facility]  ?06/10/2014 Imaging  ? MRI brain showed tumor filling the cavernous sinus on the right measuring approximately 2.6 x 1.4 x 1.9 cm, most consistent with meningioma.There is encasement of the  internal carotid artery, extension into the orbital apex, medial sella, and sphenoid  ?  ?08/21/2014 Surgery  ?  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  ?09/06/2014 Bone Marrow Biopsy  ? BM performed at [place] is not consistent with multiple myeloma, 1% plasma cell on aspirate ?  ?09/12/2014 Imaging  ?  PET CT scan show involvement of left ninth rib and right femur ?  ?09/23/2014 - 10/23/2014 Radiation Therapy  ?  she had radiation therapy to the cavernous sinus and skull base lesions, 45 Gy ?  ?10/21/2014 - 11/01/2014 Radiation Therapy  ?  she had radiation to right femur , total 30 Gy ?  ?11/26/2014 - 02/14/2015 Chemotherapy  ?  she is started on weekly dexamethasone, Velcade twice a week on day 1, 4, 8 and 11 and Revlimid days 1-14. ?  ?04/01/2015 Bone Marrow Transplant  ? 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  ?04/03/2015 - 04/18/2015 Hospital Admission  ? The patient was admitted to the hospital at  [facility] for management related to complication from stem cell transplant. She had significant nausea requiring intravenous anti-emetics. ?  ?07/17/2015 -  Chemotherapy  ? She started maintenance Revlimid and monthly zometa, then every 3 months ?  ?06/01/2018 Imaging  ? DEXA scan showed bone density T score in femur -2.3 ?  ?04/20/2019 PET scan  ? 1. No FDG avid osseous lesions or mass identified to suggest metabolically active lesion of myeloma or plasmacytoma. ?2. Small nodular density within the paravertebral right lower lobe exhibits mild to moderate increased uptake within SUV max of 3.38. This is indeterminate. Review of CT chest from 10/05/2018 shows a ?corresponding Lung nodule in this area measuring the same. Small pulmonary neoplasm cannot be excluded. ?3. Indeterminate, focal area of increased uptake is identified within the thoracic canal. Indeterminate favored to represent benign physiologic CNS  activity. ?  ?  ?04/30/2019 -  Chemotherapy  ? Patient is on Treatment Plan : MYELOMA SQ Daratumumab Faspro q28d  ?   ?04/30/2019 - 07/09/2019 Chemotherapy  ? The patient had bortezomib SQ (VELCADE) chemo injecti

## 2021-10-30 NOTE — Assessment & Plan Note (Signed)
She took a treatment break due to her husband's health issues ?She is doing well ?Despite mild pancytopenia, she tolerated treatment well ?We will continue current prescribed treatment ?I will see her again in a month for further follow-up ?

## 2021-10-30 NOTE — Assessment & Plan Note (Signed)
She has intermittent elevated serum creatinine ?We discussed importance of adequate hydration ?

## 2021-10-30 NOTE — Assessment & Plan Note (Signed)
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

## 2021-11-02 LAB — KAPPA/LAMBDA LIGHT CHAINS
Kappa free light chain: 27.8 mg/L — ABNORMAL HIGH (ref 3.3–19.4)
Kappa, lambda light chain ratio: 6.62 — ABNORMAL HIGH (ref 0.26–1.65)
Lambda free light chains: 4.2 mg/L — ABNORMAL LOW (ref 5.7–26.3)

## 2021-11-03 LAB — MULTIPLE MYELOMA PANEL, SERUM
Albumin SerPl Elph-Mcnc: 3.6 g/dL (ref 2.9–4.4)
Albumin/Glob SerPl: 1.4 (ref 0.7–1.7)
Alpha 1: 0.2 g/dL (ref 0.0–0.4)
Alpha2 Glob SerPl Elph-Mcnc: 0.9 g/dL (ref 0.4–1.0)
B-Globulin SerPl Elph-Mcnc: 1 g/dL (ref 0.7–1.3)
Gamma Glob SerPl Elph-Mcnc: 0.6 g/dL (ref 0.4–1.8)
Globulin, Total: 2.7 g/dL (ref 2.2–3.9)
IgA: 25 mg/dL — ABNORMAL LOW (ref 64–422)
IgG (Immunoglobin G), Serum: 727 mg/dL (ref 586–1602)
IgM (Immunoglobulin M), Srm: 5 mg/dL — ABNORMAL LOW (ref 26–217)
M Protein SerPl Elph-Mcnc: 0.5 g/dL — ABNORMAL HIGH
Total Protein ELP: 6.3 g/dL (ref 6.0–8.5)

## 2021-11-11 ENCOUNTER — Other Ambulatory Visit: Payer: Self-pay

## 2021-11-11 ENCOUNTER — Telehealth: Payer: Self-pay

## 2021-11-11 DIAGNOSIS — C9002 Multiple myeloma in relapse: Secondary | ICD-10-CM

## 2021-11-11 MED ORDER — POMALIDOMIDE 2 MG PO CAPS: ORAL_CAPSULE | ORAL | 0 refills | Status: DC

## 2021-11-11 NOTE — Telephone Encounter (Signed)
Received call from [employer] requesting refill and auth # via [employer]. Rx refilled with auth# [id number]. Pt is aware and verbalized thanks.  ?

## 2021-11-12 ENCOUNTER — Other Ambulatory Visit: Payer: Self-pay

## 2021-11-12 DIAGNOSIS — C9002 Multiple myeloma in relapse: Secondary | ICD-10-CM

## 2021-11-12 MED ORDER — POMALIDOMIDE 2 MG PO CAPS: ORAL_CAPSULE | ORAL | 0 refills | Status: DC

## 2021-11-20 ENCOUNTER — Other Ambulatory Visit: Payer: Self-pay

## 2021-11-20 DIAGNOSIS — C9002 Multiple myeloma in relapse: Secondary | ICD-10-CM

## 2021-11-20 MED ORDER — POMALIDOMIDE 2 MG PO CAPS: ORAL_CAPSULE | ORAL | 0 refills | Status: DC

## 2021-11-26 ENCOUNTER — Encounter: Payer: Self-pay | Admitting: Hematology and Oncology

## 2021-11-26 ENCOUNTER — Inpatient Hospital Stay: Payer: Medicare Other | Attending: Hematology and Oncology

## 2021-11-26 ENCOUNTER — Inpatient Hospital Stay (HOSPITAL_BASED_OUTPATIENT_CLINIC_OR_DEPARTMENT_OTHER): Payer: Medicare Other | Admitting: Hematology and Oncology

## 2021-11-26 ENCOUNTER — Inpatient Hospital Stay: Payer: Medicare Other

## 2021-11-26 ENCOUNTER — Other Ambulatory Visit: Payer: Self-pay

## 2021-11-26 VITALS — Resp 17

## 2021-11-26 DIAGNOSIS — D61818 Other pancytopenia: Secondary | ICD-10-CM | POA: Diagnosis not present

## 2021-11-26 DIAGNOSIS — Z880 Allergy status to penicillin: Secondary | ICD-10-CM | POA: Insufficient documentation

## 2021-11-26 DIAGNOSIS — Z7961 Long term (current) use of immunomodulator: Secondary | ICD-10-CM | POA: Insufficient documentation

## 2021-11-26 DIAGNOSIS — C9002 Multiple myeloma in relapse: Secondary | ICD-10-CM | POA: Insufficient documentation

## 2021-11-26 DIAGNOSIS — Z885 Allergy status to narcotic agent status: Secondary | ICD-10-CM | POA: Insufficient documentation

## 2021-11-26 DIAGNOSIS — Z79899 Other long term (current) drug therapy: Secondary | ICD-10-CM | POA: Insufficient documentation

## 2021-11-26 DIAGNOSIS — Z886 Allergy status to analgesic agent status: Secondary | ICD-10-CM | POA: Insufficient documentation

## 2021-11-26 DIAGNOSIS — Z7189 Other specified counseling: Secondary | ICD-10-CM

## 2021-11-26 DIAGNOSIS — R42 Dizziness and giddiness: Secondary | ICD-10-CM | POA: Diagnosis not present

## 2021-11-26 DIAGNOSIS — Z5112 Encounter for antineoplastic immunotherapy: Secondary | ICD-10-CM | POA: Insufficient documentation

## 2021-11-26 DIAGNOSIS — Z887 Allergy status to serum and vaccine status: Secondary | ICD-10-CM | POA: Insufficient documentation

## 2021-11-26 DIAGNOSIS — E538 Deficiency of other specified B group vitamins: Secondary | ICD-10-CM | POA: Insufficient documentation

## 2021-11-26 DIAGNOSIS — Z881 Allergy status to other antibiotic agents status: Secondary | ICD-10-CM | POA: Insufficient documentation

## 2021-11-26 LAB — COMPREHENSIVE METABOLIC PANEL
ALT: 10 U/L (ref 0–44)
AST: 14 U/L — ABNORMAL LOW (ref 15–41)
Albumin: 4.1 g/dL (ref 3.5–5.0)
Alkaline Phosphatase: 55 U/L (ref 38–126)
Anion gap: 5 (ref 5–15)
BUN: 11 mg/dL (ref 8–23)
CO2: 26 mmol/L (ref 22–32)
Calcium: 9.2 mg/dL (ref 8.9–10.3)
Chloride: 111 mmol/L (ref 98–111)
Creatinine, Ser: 0.91 mg/dL (ref 0.44–1.00)
GFR, Estimated: >60 mL/min (ref >60)
Glucose, Bld: 90 mg/dL (ref 70–99)
Potassium: 3.6 mmol/L (ref 3.5–5.1)
Sodium: 142 mmol/L (ref 135–145)
Total Bilirubin: 0.6 mg/dL (ref 0.3–1.2)
Total Protein: 6.6 g/dL (ref 6.5–8.1)

## 2021-11-26 LAB — CBC WITH DIFFERENTIAL/PLATELET
Abs Immature Granulocytes: 0.06 10*3/uL (ref 0.00–0.07)
Basophils Absolute: 0.2 10*3/uL — ABNORMAL HIGH (ref 0.0–0.1)
Basophils Relative: 4 %
Eosinophils Absolute: 0.7 10*3/uL — ABNORMAL HIGH (ref 0.0–0.5)
Eosinophils Relative: 15 %
HCT: 31.9 % — ABNORMAL LOW (ref 36.0–46.0)
Hemoglobin: 10.2 g/dL — ABNORMAL LOW (ref 12.0–15.0)
Immature Granulocytes: 1 %
Lymphocytes Relative: 32 %
Lymphs Abs: 1.5 10*3/uL (ref 0.7–4.0)
MCH: 28 pg (ref 26.0–34.0)
MCHC: 32 g/dL (ref 30.0–36.0)
MCV: 87.6 fL (ref 80.0–100.0)
Monocytes Absolute: 0.1 10*3/uL (ref 0.1–1.0)
Monocytes Relative: 3 %
Neutro Abs: 2.1 10*3/uL (ref 1.7–7.7)
Neutrophils Relative %: 45 %
Platelets: 299 10*3/uL (ref 150–400)
RBC: 3.64 MIL/uL — ABNORMAL LOW (ref 3.87–5.11)
RDW: 19.9 % — ABNORMAL HIGH (ref 11.5–15.5)
WBC: 4.6 10*3/uL (ref 4.0–10.5)
nRBC: 0 % (ref 0.0–0.2)

## 2021-11-26 MED ORDER — DEXAMETHASONE 4 MG PO TABS
8.0000 mg | ORAL_TABLET | Freq: Once | ORAL | Status: AC
  Administered 2021-11-26: 8 mg via ORAL
  Filled 2021-11-26: qty 2

## 2021-11-26 MED ORDER — MONTELUKAST SODIUM 10 MG PO TABS
10.0000 mg | ORAL_TABLET | Freq: Once | ORAL | Status: AC
  Administered 2021-11-26: 10 mg via ORAL
  Filled 2021-11-26: qty 1

## 2021-11-26 MED ORDER — DARATUMUMAB-HYALURONIDASE-FIHJ 1800-30000 MG-UT/15ML ~~LOC~~ SOLN
1800.0000 mg | Freq: Once | SUBCUTANEOUS | Status: AC
  Administered 2021-11-26: 1800 mg via SUBCUTANEOUS
  Filled 2021-11-26: qty 15

## 2021-11-26 MED ORDER — ACETAMINOPHEN 325 MG PO TABS
650.0000 mg | ORAL_TABLET | Freq: Once | ORAL | Status: AC
  Administered 2021-11-26: 650 mg via ORAL
  Filled 2021-11-26: qty 2

## 2021-11-26 NOTE — Progress Notes (Signed)
[facility] ?OFFICE PROGRESS NOTE ? ?Patient Care Team: ?Josetta Huddle, MD as PCP - General (Internal Medicine) ?Buford Dresser, MD as PCP - Cardiology (Cardiology) ?Ginette Pitman, MD as Consulting Physician (Hematology and Oncology) ?Hessie Dibble, MD as Consulting Physician (Hematology and Oncology) ? ?ASSESSMENT & PLAN:  ?Multiple myeloma in relapse ([place]) ?I have reviewed her recent myeloma panel ?Her serum light chains are improving ?She is doing well ?Despite mild pancytopenia, she tolerated treatment well ?We will continue current prescribed treatment ?I will see her again in a month for further follow-up ? ? ?Pancytopenia, acquired ([place]) ?She has chronic intermittent pancytopenia, 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 ?Observe closely ?This is not the cause of her dizziness ? ?Vitamin B12 deficiency ?She had history of vitamin B12 deficiency ?She has been getting vitamin B12 injection every 3 months ?I will recheck vitamin B12 level in her next visit ? ?Orders Placed This Encounter  ?Procedures  ? Vitamin B12  ?  Standing Status:   Future  ?  Standing Expiration Date:   11/27/2022  ? ? ?All questions were answered. The patient knows to call the clinic with any problems, questions or concerns. ?The total time spent in the appointment was 20 minutes encounter with patients including review of chart and various tests results, discussions about plan of care and coordination of care plan ?  ?[name], MD ?11/26/2021 12:27 PM ? ?INTERVAL HISTORY: ?Please see below for problem oriented charting. ?she returns for treatment follow-up for multiple myeloma, on Pomalyst and subcutaneous daratumumab ?She is doing well ?No recent falls ?She continues to have chronic dizziness, stable ?No recent infection, fever or chills ? ?REVIEW OF SYSTEMS:   ?Constitutional: Denies fevers, chills or abnormal weight loss ?Eyes: Denies blurriness of vision ?Ears, nose, mouth, throat, and face: Denies mucositis  or sore throat ?Respiratory: Denies cough, dyspnea or wheezes ?Cardiovascular: Denies palpitation, chest discomfort or lower extremity swelling ?Gastrointestinal:  Denies nausea, heartburn or change in bowel habits ?Skin: Denies abnormal skin rashes ?Lymphatics: Denies new lymphadenopathy or easy bruising ?Neurological:Denies numbness, tingling or new weaknesses ?Behavioral/Psych: Mood is stable, no new changes  ?All other systems were reviewed with the patient and are negative. ? ?I have reviewed the past medical history, past surgical history, social history and family history with the patient and they are unchanged from previous note. ? ?ALLERGIES:  is allergic to augmentin [amoxicillin-pot clavulanate], albuterol, codeine, ibuprofen, nsaids, tolmetin, and tetanus-diphth-acell pertussis. ? ?MEDICATIONS:  ?Current Outpatient Medications  ?Medication Sig Dispense Refill  ? acetaminophen (TYLENOL) 500 MG tablet Take 500-1,000 mg by mouth every 6 (six) hours as needed for mild pain, moderate pain, fever or headache.    ? acyclovir (ZOVIRAX) 400 MG tablet Take 1 tablet (400 mg total) by mouth 2 (two) times daily. 180 tablet 3  ? aspirin EC 81 MG tablet Take 81 mg by mouth daily with breakfast.    ? calcium carbonate (OSCAL) 1500 (600 Ca) MG TABS tablet Take 1,500 mg by mouth daily with breakfast.    ? Calcium Carbonate 500 MG CHEW Chew 500 mg by mouth as needed for indigestion. (Patient not taking: Reported on 07/06/2021)    ? Cholecalciferol (VITAMIN D3) 2000 units capsule Take 2,000 Units by mouth daily.     ? loratadine (CLARITIN) 10 MG tablet Take 10 mg by mouth daily.    ? Multiple Vitamins-Minerals (CENTRUM SILVER PO) Take 1 tablet by mouth daily.     ? omeprazole (PRILOSEC) 20 MG capsule  TAKE 1 CAPSULE(20 MG) BY MOUTH DAILY 90 capsule 11  ? ondansetron (ZOFRAN) 8 MG tablet TAKE 1 TABLET(8 MG) BY MOUTH EVERY 8 HOURS AS NEEDED FOR NAUSEA OR VOMITING 60 tablet 11  ? pomalidomide (POMALYST) 2 MG capsule Take 1 pill  daily for 14 days then rest 7 days for cycle of every 21 days 14 capsule 0  ? senna-docusate (SENOKOT-S) 8.6-50 MG tablet Take 1-2 tablets by mouth daily as needed for mild constipation or moderate constipation.    ? sodium chloride (OCEAN) 0.65 % SOLN nasal spray Place 1 spray into both nostrils as needed for congestion.    ? vitamin B-12 (CYANOCOBALAMIN) 500 MCG tablet Take 500 mcg by mouth daily.    ? ?No current facility-administered medications for this visit.  ? ?Facility-Administered Medications Ordered in Other Visits  ?Medication Dose Route Frequency Provider Last Rate Last Admin  ? 0.9 %  sodium chloride infusion   Intravenous Once Alvy Bimler, Ryelynn Guedea, MD      ? daratumumab-hyaluronidase-fihj (DARZALEX FASPRO) 1800-30000 MG-UT/15ML chemo SQ injection 1,800 mg  1,800 mg Subcutaneous Once [name], MD      ? ? ?SUMMARY OF ONCOLOGIC HISTORY: ?Oncology History Overview Note  ?? M-protein 0.69 gm/dl IFIX - IgG, Kappa IgG - 868 IgA - 19 IgM - < 20 Kappa - 21 Lambda - 5.7 ? ?09/06/2014 - Bone marrow aspirate and biopsy:  ?? Normocellular marrow for age (40%) with a small monoclonal plasma cell population (1% on aspirate). Karyotype 81, XX ?? FISH Negative for myeloma associated changes ?? 09/12/2014 - PET/CT ?? Two regions that are concerning for disease, one adjacent/involving the left ninth rib and one in the marrow of the right femur, in this patient with history of plasmacytoma.  ?  ?Multiple myeloma in relapse [facility])  ?06/10/2014 Imaging  ? MRI brain showed tumor filling the cavernous sinus on the right measuring approximately 2.6 x 1.4 x 1.9 cm, most consistent with meningioma.There is encasement of the internal carotid artery, extension into the orbital apex, medial sella, and sphenoid  ? ?  ?08/21/2014 Surgery  ?  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 ?  ?09/06/2014 Bone Marrow Biopsy  ? BM performed at [facility] is not consistent with multiple myeloma, 1% plasma cell  on aspirate ? ?  ?09/12/2014 Imaging  ?  PET CT scan show involvement of left ninth rib and right femur ? ?  ?09/23/2014 - 10/23/2014 Radiation Therapy  ?  she had radiation therapy to the cavernous sinus and skull base lesions, 45 Gy ? ?  ?10/21/2014 - 11/01/2014 Radiation Therapy  ?  she had radiation to right femur , total 30 Gy ? ?  ?11/26/2014 - 02/14/2015 Chemotherapy  ?  she is started on weekly dexamethasone, Velcade twice a week on day 1, 4, 8 and 11 and Revlimid days 1-14. ? ?  ?04/01/2015 Bone Marrow Transplant  ? 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 ?  ?04/03/2015 - 04/18/2015 Hospital Admission  ? The patient was admitted to the hospital at [place] for management related to complication from stem cell transplant. She had significant nausea requiring intravenous anti-emetics. ? ?  ?07/17/2015 -  Chemotherapy  ? She started maintenance Revlimid and monthly zometa, then every 3 months ?  ?06/01/2018 Imaging  ? DEXA scan showed bone density T score in femur -2.3 ? ?  ?04/20/2019 PET scan  ? 1. No FDG  avid osseous lesions or mass identified to suggest metabolically active lesion of myeloma or plasmacytoma. ?2. Small nodular density within the paravertebral right lower lobe exhibits mild to moderate increased uptake within SUV max of 3.38. This is indeterminate. Review of CT chest from 10/05/2018 shows a ?corresponding Lung nodule in this area measuring the same. Small pulmonary neoplasm cannot be excluded. ?3. Indeterminate, focal area of increased uptake is identified within the thoracic canal. Indeterminate favored to represent benign physiologic CNS activity. ?  ?  ?04/30/2019 -  Chemotherapy  ? Patient is on Treatment Plan : MYELOMA SQ Daratumumab Faspro q28d  ? ?  ?  ?04/30/2019 - 07/09/2019 Chemotherapy  ? The patient had bortezomib SQ (VELCADE) chemo injection 1.75 mg, 1.3 mg/m2 = 1.75 mg, Subcutaneous,  Once, 10 of 11  cycles ?Administration: 1.75 mg (04/30/2019), 1.75 mg (05/07/2019), 1.75 mg (05/14/2019), 1.75 mg (05/21/2019), 1.75 mg (05/28/2019), 1.75 mg (06/04/2019), 1.75 mg (06/11/2019), 1.75 mg (06/18/2019), 1.75 mg (06/25/2019), 1.75

## 2021-11-26 NOTE — Patient Instructions (Signed)
[facility]  Discharge Instructions: Thank you for choosing [facility] to provide your oncology and hematology care.   If you have a lab appointment with the Cancer Center, please go directly to the Cancer Center and check in at the registration area.   Wear comfortable clothing and clothing appropriate for easy access to any Portacath or PICC line.   We strive to give you quality time with your provider. You may need to reschedule your appointment if you arrive late (15 or more minutes).  Arriving late affects you and other patients whose appointments are after yours.  Also, if you miss three or more appointments without notifying the office, you may be dismissed from the clinic at the provider's discretion.      For prescription refill requests, have your pharmacy contact our office and allow 72 hours for refills to be completed.    Today you received the following chemotherapy and/or immunotherapy agents: Darzalex Faspro      To help prevent nausea and vomiting after your treatment, we encourage you to take your nausea medication as directed.  BELOW ARE SYMPTOMS THAT SHOULD BE REPORTED IMMEDIATELY: *FEVER GREATER THAN 100.4 F (38 C) OR HIGHER *CHILLS OR SWEATING *NAUSEA AND VOMITING THAT IS NOT CONTROLLED WITH YOUR NAUSEA MEDICATION *UNUSUAL SHORTNESS OF BREATH *UNUSUAL BRUISING OR BLEEDING *URINARY PROBLEMS (pain or burning when urinating, or frequent urination) *BOWEL PROBLEMS (unusual diarrhea, constipation, pain near the anus) TENDERNESS IN MOUTH AND THROAT WITH OR WITHOUT PRESENCE OF ULCERS (sore throat, sores in mouth, or a toothache) UNUSUAL RASH, SWELLING OR PAIN  UNUSUAL VAGINAL DISCHARGE OR ITCHING   Items with * indicate a potential emergency and should be followed up as soon as possible or go to the Emergency Department if any problems should occur.  Please show the CHEMOTHERAPY ALERT CARD or IMMUNOTHERAPY ALERT CARD at  check-in to the Emergency Department and triage nurse.  Should you have questions after your visit or need to cancel or reschedule your appointment, please contact [facility]  Dept: [phone]  and follow the prompts.  Office hours are 8:00 a.m. to 4:30 p.m. Monday - Friday. Please note that voicemails left after 4:00 p.m. may not be returned until the following business day.  We are closed weekends and major holidays. You have access to a nurse at all times for urgent questions. Please call the main number to the clinic Dept: [phone] and follow the prompts.   For any non-urgent questions, you may also contact your provider using MyChart. We now offer e-Visits for anyone 18 and older to request care online for non-urgent symptoms. For details visit [email or web address].   Also download the MyChart app! Go to the app store, search "MyChart", open the app, select [facility], and log in with your MyChart username and password.  Due to Covid, a mask is required upon entering the hospital/clinic. If you do not have a mask, one will be given to you upon arrival. For doctor visits, patients may have 1 support person aged 18 or older with them. For treatment visits, patients cannot have anyone with them due to current Covid guidelines and our immunocompromised population.  

## 2021-11-26 NOTE — Assessment & Plan Note (Addendum)
I have reviewed her recent myeloma panel ?Her serum light chains are improving ?She is doing well ?Despite mild pancytopenia, she tolerated treatment well ?We will continue current prescribed treatment ?I will see her again in a month for further follow-up ? ?

## 2021-11-26 NOTE — Assessment & Plan Note (Signed)
She had history of vitamin B12 deficiency ?She has been getting vitamin B12 injection every 3 months ?I will recheck vitamin B12 level in her next visit ?

## 2021-11-26 NOTE — Assessment & Plan Note (Signed)
She has chronic intermittent pancytopenia, 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 Observe closely This is not the cause of her dizziness 

## 2021-11-27 LAB — KAPPA/LAMBDA LIGHT CHAINS
Kappa free light chain: 28 mg/L — ABNORMAL HIGH (ref 3.3–19.4)
Kappa, lambda light chain ratio: 7.18 — ABNORMAL HIGH (ref 0.26–1.65)
Lambda free light chains: 3.9 mg/L — ABNORMAL LOW (ref 5.7–26.3)

## 2021-11-30 LAB — MULTIPLE MYELOMA PANEL, SERUM
Albumin SerPl Elph-Mcnc: 3.7 g/dL (ref 2.9–4.4)
Albumin/Glob SerPl: 1.6 (ref 0.7–1.7)
Alpha 1: 0.2 g/dL (ref 0.0–0.4)
Alpha2 Glob SerPl Elph-Mcnc: 0.7 g/dL (ref 0.4–1.0)
B-Globulin SerPl Elph-Mcnc: 0.9 g/dL (ref 0.7–1.3)
Gamma Glob SerPl Elph-Mcnc: 0.6 g/dL (ref 0.4–1.8)
Globulin, Total: 2.4 g/dL (ref 2.2–3.9)
IgA: 19 mg/dL — ABNORMAL LOW (ref 64–422)
IgG (Immunoglobin G), Serum: 641 mg/dL (ref 586–1602)
IgM (Immunoglobulin M), Srm: <5 mg/dL — ABNORMAL LOW (ref 26–217)
M Protein SerPl Elph-Mcnc: 0.3 g/dL — ABNORMAL HIGH
Total Protein ELP: 6.1 g/dL (ref 6.0–8.5)

## 2021-12-01 ENCOUNTER — Telehealth: Payer: Self-pay

## 2021-12-01 NOTE — Telephone Encounter (Signed)
Called and given below message. She verbalized understanding. 

## 2021-12-01 NOTE — Telephone Encounter (Signed)
-----   Message from [name], MD sent at 12/01/2021  9:19 AM EDT ----- ?Pls let her know myeloma panel is better ? ?

## 2021-12-08 ENCOUNTER — Telehealth: Payer: Self-pay

## 2021-12-08 NOTE — Telephone Encounter (Signed)
Returned her call. Sent a message for her to Benjamine Mola in the pharmacy that she will come in tomorrow to sign the paper work. Sent a message to [place]. ?

## 2021-12-09 ENCOUNTER — Telehealth: Payer: Self-pay

## 2021-12-09 NOTE — Telephone Encounter (Signed)
Oral Oncology Patient Advocate Encounter ? ?Met patient in lobby to complete application for [place] in an effort to reduce patient's out of pocket expense for Pomalyst to $0.   ? ?Application completed and faxed to [phone]  ? ?[facility] Access Support phone number for follow up is [phone]. ? ?This encounter will be updated until final determination. ? ?Wynn Maudlin CPHT ?Specialty Pharmacy Patient Advocate ?[place] ?Phone [phone] ?Fax [phone] ?12/09/2021 10:44 AM ? ? ?

## 2021-12-14 ENCOUNTER — Other Ambulatory Visit: Payer: Self-pay

## 2021-12-14 DIAGNOSIS — C9002 Multiple myeloma in relapse: Secondary | ICD-10-CM

## 2021-12-14 MED ORDER — POMALIDOMIDE 2 MG PO CAPS: ORAL_CAPSULE | ORAL | 0 refills | Status: DC

## 2021-12-14 NOTE — Telephone Encounter (Signed)
Patient is approved for Pomalyst at no cost from [facility] 12/14/21-08/08/22 ? ?[facility] uses [facility] [place], [place] ? ?Wynn Maudlin CPHT ?Specialty Pharmacy Patient Advocate ?[place] ?Phone [phone] ?Fax [phone] ?12/14/2021 2:02 PM ? ?

## 2021-12-24 ENCOUNTER — Inpatient Hospital Stay (HOSPITAL_BASED_OUTPATIENT_CLINIC_OR_DEPARTMENT_OTHER): Payer: Medicare Other | Admitting: Hematology and Oncology

## 2021-12-24 ENCOUNTER — Inpatient Hospital Stay: Payer: Medicare Other

## 2021-12-24 ENCOUNTER — Other Ambulatory Visit: Payer: Self-pay

## 2021-12-24 ENCOUNTER — Telehealth: Payer: Self-pay

## 2021-12-24 ENCOUNTER — Inpatient Hospital Stay: Payer: Medicare Other | Attending: Hematology and Oncology

## 2021-12-24 DIAGNOSIS — Z7961 Long term (current) use of immunomodulator: Secondary | ICD-10-CM | POA: Diagnosis not present

## 2021-12-24 DIAGNOSIS — R42 Dizziness and giddiness: Secondary | ICD-10-CM

## 2021-12-24 DIAGNOSIS — Z886 Allergy status to analgesic agent status: Secondary | ICD-10-CM | POA: Diagnosis not present

## 2021-12-24 DIAGNOSIS — Z881 Allergy status to other antibiotic agents status: Secondary | ICD-10-CM | POA: Diagnosis not present

## 2021-12-24 DIAGNOSIS — Z5112 Encounter for antineoplastic immunotherapy: Secondary | ICD-10-CM | POA: Diagnosis present

## 2021-12-24 DIAGNOSIS — Z9481 Bone marrow transplant status: Secondary | ICD-10-CM

## 2021-12-24 DIAGNOSIS — Z887 Allergy status to serum and vaccine status: Secondary | ICD-10-CM | POA: Diagnosis not present

## 2021-12-24 DIAGNOSIS — C9002 Multiple myeloma in relapse: Secondary | ICD-10-CM

## 2021-12-24 DIAGNOSIS — Z885 Allergy status to narcotic agent status: Secondary | ICD-10-CM | POA: Diagnosis not present

## 2021-12-24 DIAGNOSIS — E538 Deficiency of other specified B group vitamins: Secondary | ICD-10-CM

## 2021-12-24 DIAGNOSIS — Z79899 Other long term (current) drug therapy: Secondary | ICD-10-CM | POA: Insufficient documentation

## 2021-12-24 DIAGNOSIS — D61818 Other pancytopenia: Secondary | ICD-10-CM | POA: Diagnosis not present

## 2021-12-24 DIAGNOSIS — D638 Anemia in other chronic diseases classified elsewhere: Secondary | ICD-10-CM

## 2021-12-24 DIAGNOSIS — Z7189 Other specified counseling: Secondary | ICD-10-CM

## 2021-12-24 DIAGNOSIS — Z880 Allergy status to penicillin: Secondary | ICD-10-CM | POA: Insufficient documentation

## 2021-12-24 LAB — CBC WITH DIFFERENTIAL/PLATELET
Abs Immature Granulocytes: 0.08 10*3/uL — ABNORMAL HIGH (ref 0.00–0.07)
Basophils Absolute: 0.1 10*3/uL (ref 0.0–0.1)
Basophils Relative: 3 %
Eosinophils Absolute: 0.4 10*3/uL (ref 0.0–0.5)
Eosinophils Relative: 8 %
HCT: 32.6 % — ABNORMAL LOW (ref 36.0–46.0)
Hemoglobin: 10.6 g/dL — ABNORMAL LOW (ref 12.0–15.0)
Immature Granulocytes: 2 %
Lymphocytes Relative: 31 %
Lymphs Abs: 1.5 10*3/uL (ref 0.7–4.0)
MCH: 28.6 pg (ref 26.0–34.0)
MCHC: 32.5 g/dL (ref 30.0–36.0)
MCV: 87.9 fL (ref 80.0–100.0)
Monocytes Absolute: 0.1 10*3/uL (ref 0.1–1.0)
Monocytes Relative: 2 %
Neutro Abs: 2.6 10*3/uL (ref 1.7–7.7)
Neutrophils Relative %: 54 %
Platelets: 234 10*3/uL (ref 150–400)
RBC: 3.71 MIL/uL — ABNORMAL LOW (ref 3.87–5.11)
RDW: 17.8 % — ABNORMAL HIGH (ref 11.5–15.5)
WBC: 4.8 10*3/uL (ref 4.0–10.5)
nRBC: 0 % (ref 0.0–0.2)

## 2021-12-24 LAB — COMPREHENSIVE METABOLIC PANEL
ALT: 9 U/L (ref 0–44)
AST: 15 U/L (ref 15–41)
Albumin: 4.1 g/dL (ref 3.5–5.0)
Alkaline Phosphatase: 61 U/L (ref 38–126)
Anion gap: 7 (ref 5–15)
BUN: 13 mg/dL (ref 8–23)
CO2: 25 mmol/L (ref 22–32)
Calcium: 9.1 mg/dL (ref 8.9–10.3)
Chloride: 110 mmol/L (ref 98–111)
Creatinine, Ser: 1.1 mg/dL — ABNORMAL HIGH (ref 0.44–1.00)
GFR, Estimated: 53 mL/min — ABNORMAL LOW (ref >60)
Glucose, Bld: 82 mg/dL (ref 70–99)
Potassium: 3.1 mmol/L — ABNORMAL LOW (ref 3.5–5.1)
Sodium: 142 mmol/L (ref 135–145)
Total Bilirubin: 0.6 mg/dL (ref 0.3–1.2)
Total Protein: 6.9 g/dL (ref 6.5–8.1)

## 2021-12-24 LAB — VITAMIN B12: Vitamin B-12: 487 pg/mL (ref 180–914)

## 2021-12-24 MED ORDER — ACETAMINOPHEN 325 MG PO TABS
650.0000 mg | ORAL_TABLET | Freq: Once | ORAL | Status: AC
  Administered 2021-12-24: 650 mg via ORAL
  Filled 2021-12-24: qty 2

## 2021-12-24 MED ORDER — MONTELUKAST SODIUM 10 MG PO TABS
10.0000 mg | ORAL_TABLET | Freq: Once | ORAL | Status: AC
  Administered 2021-12-24: 10 mg via ORAL
  Filled 2021-12-24: qty 1

## 2021-12-24 MED ORDER — CYANOCOBALAMIN 1000 MCG/ML IJ SOLN
1000.0000 ug | Freq: Once | INTRAMUSCULAR | Status: AC
  Administered 2021-12-24: 1000 ug via INTRAMUSCULAR
  Filled 2021-12-24: qty 1

## 2021-12-24 MED ORDER — DEXAMETHASONE 4 MG PO TABS
8.0000 mg | ORAL_TABLET | Freq: Once | ORAL | Status: AC
  Administered 2021-12-24: 8 mg via ORAL
  Filled 2021-12-24: qty 2

## 2021-12-24 MED ORDER — DARATUMUMAB-HYALURONIDASE-FIHJ 1800-30000 MG-UT/15ML ~~LOC~~ SOLN
1800.0000 mg | Freq: Once | SUBCUTANEOUS | Status: AC
  Administered 2021-12-24: 1800 mg via SUBCUTANEOUS
  Filled 2021-12-24: qty 15

## 2021-12-24 NOTE — Patient Instructions (Signed)
[facility] MEDICAL [facility]COLOG[facility]  Discharge Instructions: Thank you for choosing [facility] to provide your oncology and hematology care.   If you have a lab appointment with the Cancer Center, please go directly to the Cancer Center and check in at the registration area.   Wear comfortable clothing and clothing appropriate for easy access to any Portacath or PICC line.   We strive to give you quality time with your provider. You may need to reschedule your appointment if you arrive late (15 or more minutes).  Arriving late affects you and other patients whose appointments are after yours.  Also, if you miss three or more appointments without notifying the office, you may be dismissed from the clinic at the provider's discretion.      For prescription refill requests, have your pharmacy contact our office and allow 72 hours for refills to be completed.    Today you received the following chemotherapy and/or immunotherapy agents: keytruda      To help prevent nausea and vomiting after your treatment, we encourage you to take your nausea medication as directed.  BELOW ARE SYMPTOMS THAT SHOULD BE REPORTED IMMEDIATELY: *FEVER GREATER THAN 100.4 F (38 C) OR HIGHER *CHILLS OR SWEATING *NAUSEA AND VOMITING THAT IS NOT CONTROLLED WITH YOUR NAUSEA MEDICATION *UNUSUAL SHORTNESS OF BREATH *UNUSUAL BRUISING OR BLEEDING *URINARY PROBLEMS (pain or burning when urinating, or frequent urination) *BOWEL PROBLEMS (unusual diarrhea, constipation, pain near the anus) TENDERNESS IN MOUTH AND THROAT WITH OR WITHOUT PRESENCE OF ULCERS (sore throat, sores in mouth, or a toothache) UNUSUAL RASH, SWELLING OR PAIN  UNUSUAL VAGINAL DISCHARGE OR ITCHING   Items with * indicate a potential emergency and should be followed up as soon as possible or go to the Emergency Department if any problems should occur.  Please show the CHEMOTHERAPY ALERT CARD or IMMUNOTHERAPY ALERT CARD at check-in to  the Emergency Department and triage nurse.  Should you have questions after your visit or need to cancel or reschedule your appointment, please contact [facility]  Dept: [phone]  and follow the prompts.  Office hours are 8:00 a.m. to 4:30 p.m. Monday - Friday. Please note that voicemails left after 4:00 p.m. may not be returned until the following business day.  We are closed weekends and major holidays. You have access to a nurse at all times for urgent questions. Please call the main number to the clinic Dept: [phone] and follow the prompts.   For any non-urgent questions, you may also contact your provider using MyChart. We now offer e-Visits for anyone 18 and older to request care online for non-urgent symptoms. For details visit [email or web address].   Also download the MyChart app! Go to the app store, search "MyChart", open the app, select [facility], and log in with your MyChart username and password.  Due to Covid, a mask is required upon entering the hospital/clinic. If you do not have a mask, one will be given to you upon arrival. For doctor visits, patients may have 1 support person aged 18 or older with them. For treatment visits, patients cannot have anyone with them due to current Covid guidelines and our immunocompromised population.   

## 2021-12-24 NOTE — Telephone Encounter (Signed)
Oral Oncology Pharmacist Encounter  Spoke with patient while down in infusion area about patient assistance program. Patient will no longer use [facility] to receive Pomalyst and will receive medication from [facility]. Patient was given the phone number to call each month for shipment.   Patient also notified that although the medication patient assistance is only good until the end of 2023, that we will work on renewal applications prior to the end of the year.  Patient states understanding and appreciation.  Drema Halon, PharmD Hematology/Oncology Clinical Pharmacist [facility] [phone]

## 2021-12-25 ENCOUNTER — Encounter: Payer: Self-pay | Admitting: Hematology and Oncology

## 2021-12-25 LAB — KAPPA/LAMBDA LIGHT CHAINS
Kappa free light chain: 66.7 mg/L — ABNORMAL HIGH (ref 3.3–19.4)
Kappa, lambda light chain ratio: 21.52 — ABNORMAL HIGH (ref 0.26–1.65)
Lambda free light chains: 3.1 mg/L — ABNORMAL LOW (ref 5.7–26.3)

## 2021-12-25 NOTE — Assessment & Plan Note (Signed)
Her postural dizziness has improved since discontinuation of steroid treatment I recommend she continues follow-up with neurologist

## 2021-12-25 NOTE — Assessment & Plan Note (Signed)
Recent myeloma panel show excellent response to treatment She will continue treatment as scheduled I will see her again next month for further follow-up

## 2021-12-25 NOTE — Assessment & Plan Note (Signed)
She has chronic intermittent pancytopenia, 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 Observe closely This is not the cause of her dizziness 

## 2021-12-25 NOTE — Progress Notes (Signed)
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   All questions were answered. The patient knows to call the clinic with any problems, questions or concerns. The total time spent in the appointment was 20 minutes encounter with patients including review of chart and various tests results, discussions about plan of care and coordination of care plan   [name], MD 12/25/2021 9:03 AM  INTERVAL HISTORY: Please see below for problem oriented charting. she returns for treatment follow-up on monthly daratumumab and treatment with Pomalyst The patient ruminates over her concern that she lost the grant for payment of the Pomalyst but another grant was reapplied and she was approved for extension of financial assistance Her dizziness is stable No recent falls No recent infection, fever or chills  REVIEW OF SYSTEMS:   Constitutional: Denies fevers, chills or abnormal weight loss Eyes: Denies blurriness of vision Ears, nose, mouth, throat, and face: Denies mucositis or sore throat Respiratory: Denies cough, dyspnea or  wheezes Cardiovascular: Denies palpitation, chest discomfort or lower extremity swelling Gastrointestinal:  Denies nausea, heartburn or change in bowel habits Skin: Denies abnormal skin rashes Lymphatics: Denies new lymphadenopathy or easy bruising Neurological:Denies numbness, tingling or new weaknesses Behavioral/Psych: Mood is stable, no new changes  All other systems were reviewed with the patient and are negative.  I have reviewed the past medical history, past surgical history, social history and family history with the patient and they are unchanged from previous note.  ALLERGIES:  is allergic to augmentin [amoxicillin-pot clavulanate], albuterol, codeine, ibuprofen, nsaids, tolmetin, and tetanus-diphth-acell pertussis.  MEDICATIONS:  Current Outpatient Medications  Medication Sig Dispense Refill   acetaminophen (TYLENOL) 500 MG tablet Take 500-1,000 mg by mouth every 6 (six) hours as needed for mild pain, moderate pain, fever or headache.     acyclovir (ZOVIRAX) 400 MG tablet Take 1 tablet (400 mg total) by mouth 2 (two) times daily. 180 tablet 3   aspirin EC 81 MG tablet Take 81 mg by mouth daily with breakfast.     calcium carbonate (OSCAL) 1500 (600 Ca) MG TABS tablet Take 1,500 mg by mouth daily with breakfast.     Calcium Carbonate 500 MG CHEW Chew 500 mg by mouth as needed for indigestion. (Patient not taking: Reported on 07/06/2021)     Cholecalciferol (VITAMIN D3) 2000 units capsule Take 2,000 Units by mouth daily.      loratadine (CLARITIN) 10 MG tablet Take 10 mg by mouth daily.     Multiple Vitamins-Minerals (CENTRUM SILVER PO) Take 1 tablet by mouth daily.      omeprazole (PRILOSEC) 20 MG capsule TAKE 1 CAPSULE(20 MG) BY MOUTH DAILY 90 capsule 11   ondansetron (ZOFRAN) 8 MG tablet TAKE 1 TABLET(8 MG) BY  MOUTH EVERY 8 HOURS AS NEEDED FOR NAUSEA OR VOMITING 60 tablet 11   pomalidomide (POMALYST) 2 MG capsule Take 1 pill daily for 14 days then rest 7 days for cycle of every  21 days 14 capsule 0   senna-docusate (SENOKOT-S) 8.6-50 MG tablet Take 1-2 tablets by mouth daily as needed for mild constipation or moderate constipation.     sodium chloride (OCEAN) 0.65 % SOLN nasal spray Place 1 spray into both nostrils as needed for congestion.     vitamin B-12 (CYANOCOBALAMIN) 500 MCG tablet Take 500 mcg by mouth daily.     No current facility-administered medications for this visit.   Facility-Administered Medications Ordered in Other Visits  Medication Dose Route Frequency Provider Last Rate Last Admin   0.9 %  sodium chloride infusion   Intravenous Once Alvy Bimler, Ni, MD        SUMMARY OF ONCOLOGIC HISTORY: Oncology History Overview Note   M-protein 0.69 gm/dl IFIX - IgG, Kappa IgG - 868 IgA - 19 IgM - < 20 Kappa - 21 Lambda - 5.7  09/06/2014 - Bone marrow aspirate and biopsy:   Normocellular marrow for age (40%) with a small monoclonal plasma cell population (1% on aspirate). Karyotype 2, XX  FISH Negative for myeloma associated changes  09/12/2014 - PET/CT  Two regions that are concerning for disease, one adjacent/involving the left ninth rib and one in the marrow of the right femur, in this patient with history of plasmacytoma.    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    04/01/2015 Bone Marrow Transplant   She received melphalan chemotherapy on 03/31/2015 followed by autologous stem cell transplant the day after    04/03/2015 - 04/18/2015 Hospital Admission   The patient was admitted to the hospital at [facility] for management related to complication from stem cell transplant. She had significant nausea requiring intravenous anti-emetics.    07/17/2015 -  Chemotherapy   She started maintenance Revlimid and monthly zometa, then every 3 months   06/01/2018 Imaging   DEXA scan showed bone density T score in femur -2.3    04/20/2019 PET scan   1. No FDG avid osseous lesions or mass identified to suggest metabolically active lesion of myeloma or plasmacytoma. 2. Small nodular density within the paravertebral right lower lobe exhibits mild to moderate increased uptake within SUV max of 3.38. This is indeterminate. Review of CT chest from 10/05/2018 shows  a corresponding Lung nodule in this area measuring the same. Small pulmonary neoplasm cannot be excluded. 3. Indeterminate, focal area of increased uptake is identified within the thoracic canal. Indeterminate favored to represent benign physiologic CNS activity.     04/30/2019 -  Chemotherapy   Patient is on Treatment Plan : MYELOMA SQ Daratumumab Faspro q28d      04/30/2019 - 07/09/2019 Chemotherapy   The patient had bortezomib SQ (VELCADE) chemo injection 1.75 mg, 1.3 mg/m2 = 1.75 mg, Subcutaneous,  Once, 10 of 11 cycles Administration: 1.75 mg (04/30/2019), 1.75 mg (05/07/2019), 1.75 mg (05/14/2019), 1.75 mg (05/21/2019), 1.75 mg (05/28/2019), 1.75 mg (06/04/2019), 1.75 mg (06/11/2019), 1.75 mg (06/18/2019), 1.75 mg (06/25/2019), 1.75 mg  (07/09/2019)   for chemotherapy treatment.       PHYSICAL EXAMINATION: ECOG PERFORMANCE STATUS: 1 - Symptomatic but completely ambulatory  Vitals:   12/24/21 1153  BP: 129/83  Pulse: 81  Resp: 18  Temp: 99.1 F (37.3 C)  SpO2: 100%   Filed Weights   12/24/21 1153  Weight: 111 lb 3.2 oz (50.4 kg)    GENERAL:alert, no distress and comfortable NEURO: alert & oriented x 3 with fluent speech, no focal motor/sensory deficits  LABORATORY DATA:  I have reviewed the data as listed    Component Value Date/Time   NA 142 12/24/2021 1125   NA 143 07/14/2017 1245   K 3.1 (L) 12/24/2021 1125   K 3.3 (L) 07/14/2017 1245   CL 110 12/24/2021 1125   CO2 25 12/24/2021 1125   CO2 20 (L) 07/14/2017 1245   GLUCOSE 82 12/24/2021 1125   GLUCOSE 106 07/14/2017 1245   BUN 13 12/24/2021 1125   BUN 9.3 07/14/2017 1245   CREATININE 1.10 (H) 12/24/2021 1125   CREATININE 1.01 (H) 01/27/2021 0847   CREATININE 0.8 07/14/2017 1245   CALCIUM 9.1 12/24/2021 1125   CALCIUM 8.4 07/14/2017 1245   PROT 6.9 12/24/2021 1125   PROT 6.0 07/14/2017 1245   PROT 6.2 (L) 07/14/2017 1245   ALBUMIN 4.1 12/24/2021 1125   ALBUMIN 3.6 07/14/2017 1245   AST 15 12/24/2021 1125   AST 21 01/27/2021 0847   AST 21 07/14/2017 1245   ALT 9 12/24/2021 1125   ALT 14 01/27/2021 0847   ALT 21 07/14/2017 1245   ALKPHOS 61 12/24/2021 1125   ALKPHOS 78 07/14/2017 1245   BILITOT 0.6 12/24/2021 1125   BILITOT 0.6 01/27/2021 0847   BILITOT 0.43 07/14/2017 1245   GFRNONAA 53 (L) 12/24/2021 1125   GFRNONAA 60 (L) 01/27/2021 0847   GFRAA 57 (L) 04/21/2020 0817   GFRAA >60 01/14/2020 0828    No results found for: SPEP, UPEP  Lab Results  Component Value Date   WBC 4.8 12/24/2021   NEUTROABS 2.6 12/24/2021   HGB 10.6 (L) 12/24/2021   HCT 32.6 (L) 12/24/2021   MCV 87.9 12/24/2021   PLT 234 12/24/2021      Chemistry      Component Value Date/Time   NA 142 12/24/2021 1125   NA 143 07/14/2017 1245   K 3.1 (L)  12/24/2021 1125   K 3.3 (L) 07/14/2017 1245   CL 110 12/24/2021 1125   CO2 25 12/24/2021 1125   CO2 20 (L) 07/14/2017 1245   BUN 13 12/24/2021 1125   BUN 9.3 07/14/2017 1245   CREATININE 1.10 (H) 12/24/2021 1125   CREATININE 1.01 (H) 01/27/2021 0847   CREATININE 0.8 07/14/2017 1245      Component Value Date/Time   CALCIUM 9.1  12/24/2021 1125   CALCIUM 8.4 07/14/2017 1245   ALKPHOS 61 12/24/2021 1125   ALKPHOS 78 07/14/2017 1245   AST 15 12/24/2021 1125   AST 21 01/27/2021 0847   AST 21 07/14/2017 1245   ALT 9 12/24/2021 1125   ALT 14 01/27/2021 0847   ALT 21 07/14/2017 1245   BILITOT 0.6 12/24/2021 1125   BILITOT 0.6 01/27/2021 0847   BILITOT 0.43 07/14/2017 1245

## 2021-12-28 LAB — MULTIPLE MYELOMA PANEL, SERUM
Albumin SerPl Elph-Mcnc: 3.6 g/dL (ref 2.9–4.4)
Albumin/Glob SerPl: 1.4 (ref 0.7–1.7)
Alpha 1: 0.2 g/dL (ref 0.0–0.4)
Alpha2 Glob SerPl Elph-Mcnc: 0.7 g/dL (ref 0.4–1.0)
B-Globulin SerPl Elph-Mcnc: 1 g/dL (ref 0.7–1.3)
Gamma Glob SerPl Elph-Mcnc: 0.8 g/dL (ref 0.4–1.8)
Globulin, Total: 2.7 g/dL (ref 2.2–3.9)
IgA: 15 mg/dL — ABNORMAL LOW (ref 64–422)
IgG (Immunoglobin G), Serum: 721 mg/dL (ref 586–1602)
IgM (Immunoglobulin M), Srm: <5 mg/dL — ABNORMAL LOW (ref 26–217)
M Protein SerPl Elph-Mcnc: 0.5 g/dL — ABNORMAL HIGH
Total Protein ELP: 6.3 g/dL (ref 6.0–8.5)

## 2022-01-05 ENCOUNTER — Other Ambulatory Visit: Payer: Self-pay

## 2022-01-05 ENCOUNTER — Telehealth: Payer: Self-pay

## 2022-01-05 DIAGNOSIS — C9002 Multiple myeloma in relapse: Secondary | ICD-10-CM

## 2022-01-05 MED ORDER — POMALIDOMIDE 2 MG PO CAPS: ORAL_CAPSULE | ORAL | 0 refills | Status: DC

## 2022-01-05 NOTE — Telephone Encounter (Signed)
Returned her call and sent Pomalyst Rx to pharmacy. Given phone # to call pharmacy tomorrow. She verbalized understanding.

## 2022-01-21 ENCOUNTER — Inpatient Hospital Stay (HOSPITAL_BASED_OUTPATIENT_CLINIC_OR_DEPARTMENT_OTHER): Payer: Medicare Other | Admitting: Hematology and Oncology

## 2022-01-21 ENCOUNTER — Encounter: Payer: Self-pay | Admitting: Hematology and Oncology

## 2022-01-21 ENCOUNTER — Inpatient Hospital Stay: Payer: Medicare Other

## 2022-01-21 ENCOUNTER — Other Ambulatory Visit: Payer: Self-pay

## 2022-01-21 ENCOUNTER — Inpatient Hospital Stay: Payer: Medicare Other | Attending: Hematology and Oncology

## 2022-01-21 DIAGNOSIS — Z886 Allergy status to analgesic agent status: Secondary | ICD-10-CM | POA: Insufficient documentation

## 2022-01-21 DIAGNOSIS — N183 Chronic kidney disease, stage 3 unspecified: Secondary | ICD-10-CM | POA: Diagnosis not present

## 2022-01-21 DIAGNOSIS — C9002 Multiple myeloma in relapse: Secondary | ICD-10-CM | POA: Diagnosis present

## 2022-01-21 DIAGNOSIS — Z79899 Other long term (current) drug therapy: Secondary | ICD-10-CM | POA: Insufficient documentation

## 2022-01-21 DIAGNOSIS — R42 Dizziness and giddiness: Secondary | ICD-10-CM | POA: Diagnosis not present

## 2022-01-21 DIAGNOSIS — Z5112 Encounter for antineoplastic immunotherapy: Secondary | ICD-10-CM | POA: Diagnosis present

## 2022-01-21 DIAGNOSIS — Z881 Allergy status to other antibiotic agents status: Secondary | ICD-10-CM | POA: Diagnosis not present

## 2022-01-21 DIAGNOSIS — D61818 Other pancytopenia: Secondary | ICD-10-CM | POA: Insufficient documentation

## 2022-01-21 DIAGNOSIS — Z885 Allergy status to narcotic agent status: Secondary | ICD-10-CM | POA: Diagnosis not present

## 2022-01-21 DIAGNOSIS — Z887 Allergy status to serum and vaccine status: Secondary | ICD-10-CM | POA: Insufficient documentation

## 2022-01-21 DIAGNOSIS — Z7961 Long term (current) use of immunomodulator: Secondary | ICD-10-CM | POA: Insufficient documentation

## 2022-01-21 DIAGNOSIS — R7989 Other specified abnormal findings of blood chemistry: Secondary | ICD-10-CM | POA: Insufficient documentation

## 2022-01-21 DIAGNOSIS — Z880 Allergy status to penicillin: Secondary | ICD-10-CM | POA: Insufficient documentation

## 2022-01-21 DIAGNOSIS — D638 Anemia in other chronic diseases classified elsewhere: Secondary | ICD-10-CM

## 2022-01-21 DIAGNOSIS — Z7189 Other specified counseling: Secondary | ICD-10-CM

## 2022-01-21 LAB — CBC WITH DIFFERENTIAL/PLATELET
Abs Immature Granulocytes: 0.04 10*3/uL (ref 0.00–0.07)
Basophils Absolute: 0.1 10*3/uL (ref 0.0–0.1)
Basophils Relative: 3 %
Eosinophils Absolute: 0.4 10*3/uL (ref 0.0–0.5)
Eosinophils Relative: 9 %
HCT: 31.4 % — ABNORMAL LOW (ref 36.0–46.0)
Hemoglobin: 10.3 g/dL — ABNORMAL LOW (ref 12.0–15.0)
Immature Granulocytes: 1 %
Lymphocytes Relative: 36 %
Lymphs Abs: 1.6 10*3/uL (ref 0.7–4.0)
MCH: 28.9 pg (ref 26.0–34.0)
MCHC: 32.8 g/dL (ref 30.0–36.0)
MCV: 88.2 fL (ref 80.0–100.0)
Monocytes Absolute: 0.2 10*3/uL (ref 0.1–1.0)
Monocytes Relative: 5 %
Neutro Abs: 2 10*3/uL (ref 1.7–7.7)
Neutrophils Relative %: 46 %
Platelets: 174 10*3/uL (ref 150–400)
RBC: 3.56 MIL/uL — ABNORMAL LOW (ref 3.87–5.11)
RDW: 17.6 % — ABNORMAL HIGH (ref 11.5–15.5)
WBC: 4.3 10*3/uL (ref 4.0–10.5)
nRBC: 0 % (ref 0.0–0.2)

## 2022-01-21 LAB — COMPREHENSIVE METABOLIC PANEL
ALT: 9 U/L (ref 0–44)
AST: 14 U/L — ABNORMAL LOW (ref 15–41)
Albumin: 4.2 g/dL (ref 3.5–5.0)
Alkaline Phosphatase: 59 U/L (ref 38–126)
Anion gap: 6 (ref 5–15)
BUN: 10 mg/dL (ref 8–23)
CO2: 26 mmol/L (ref 22–32)
Calcium: 9.6 mg/dL (ref 8.9–10.3)
Chloride: 108 mmol/L (ref 98–111)
Creatinine, Ser: 0.92 mg/dL (ref 0.44–1.00)
GFR, Estimated: >60 mL/min (ref >60)
Glucose, Bld: 84 mg/dL (ref 70–99)
Potassium: 3.6 mmol/L (ref 3.5–5.1)
Sodium: 140 mmol/L (ref 135–145)
Total Bilirubin: 0.7 mg/dL (ref 0.3–1.2)
Total Protein: 7 g/dL (ref 6.5–8.1)

## 2022-01-21 MED ORDER — DARATUMUMAB-HYALURONIDASE-FIHJ 1800-30000 MG-UT/15ML ~~LOC~~ SOLN
1800.0000 mg | Freq: Once | SUBCUTANEOUS | Status: AC
  Administered 2022-01-21: 1800 mg via SUBCUTANEOUS
  Filled 2022-01-21: qty 15

## 2022-01-21 MED ORDER — DEXAMETHASONE 4 MG PO TABS
8.0000 mg | ORAL_TABLET | Freq: Once | ORAL | Status: AC
  Administered 2022-01-21: 8 mg via ORAL
  Filled 2022-01-21: qty 2

## 2022-01-21 MED ORDER — MONTELUKAST SODIUM 10 MG PO TABS
10.0000 mg | ORAL_TABLET | Freq: Once | ORAL | Status: AC
  Administered 2022-01-21: 10 mg via ORAL
  Filled 2022-01-21: qty 1

## 2022-01-21 MED ORDER — ACETAMINOPHEN 325 MG PO TABS
650.0000 mg | ORAL_TABLET | Freq: Once | ORAL | Status: AC
  Administered 2022-01-21: 650 mg via ORAL
  Filled 2022-01-21: qty 2

## 2022-01-21 NOTE — Assessment & Plan Note (Signed)
Her postural dizziness is slightly worse likely secondary to dehydration I recommend she continues follow-up with neurologist

## 2022-01-21 NOTE — Assessment & Plan Note (Signed)
I reviewed recent myeloma panel with the patient She has fluctuation of M protein and light chain studies She has intermittent elevated serum creatinine We discussed that elevated serum creatinine/dehydration can affect interpretation of her myeloma panel For now, I do not plan to change her treatment She will continue treatment as scheduled I will see her again next month for further follow-up

## 2022-01-21 NOTE — Patient Instructions (Signed)
[facility]COLOG[facility]  Discharge Instructions: Thank you for choosing [facility] to provide your oncology and hematology care.   If you have a lab appointment with the Cancer Center, please go directly to the Cancer Center and check in at the registration area.   Wear comfortable clothing and clothing appropriate for easy access to any Portacath or PICC line.   We strive to give you quality time with your provider. You may need to reschedule your appointment if you arrive late (15 or more minutes).  Arriving late affects you and other patients whose appointments are after yours.  Also, if you miss three or more appointments without notifying the office, you may be dismissed from the clinic at the provider's discretion.      For prescription refill requests, have your pharmacy contact our office and allow 72 hours for refills to be completed.    Today you received the following chemotherapy and/or immunotherapy agents: Daratumumab-Faspro.       To help prevent nausea and vomiting after your treatment, we encourage you to take your nausea medication as directed.  BELOW ARE SYMPTOMS THAT SHOULD BE REPORTED IMMEDIATELY: *FEVER GREATER THAN 100.4 F (38 C) OR HIGHER *CHILLS OR SWEATING *NAUSEA AND VOMITING THAT IS NOT CONTROLLED WITH YOUR NAUSEA MEDICATION *UNUSUAL SHORTNESS OF BREATH *UNUSUAL BRUISING OR BLEEDING *URINARY PROBLEMS (pain or burning when urinating, or frequent urination) *BOWEL PROBLEMS (unusual diarrhea, constipation, pain near the anus) TENDERNESS IN MOUTH AND THROAT WITH OR WITHOUT PRESENCE OF ULCERS (sore throat, sores in mouth, or a toothache) UNUSUAL RASH, SWELLING OR PAIN  UNUSUAL VAGINAL DISCHARGE OR ITCHING   Items with * indicate a potential emergency and should be followed up as soon as possible or go to the Emergency Department if any problems should occur.  Please show the CHEMOTHERAPY ALERT CARD or IMMUNOTHERAPY ALERT CARD at  check-in to the Emergency Department and triage nurse.  Should you have questions after your visit or need to cancel or reschedule your appointment, please contact [facility]  Dept: [phone]  and follow the prompts.  Office hours are 8:00 a.m. to 4:30 p.m. Monday - Friday. Please note that voicemails left after 4:00 p.m. may not be returned until the following business day.  We are closed weekends and major holidays. You have access to a nurse at all times for urgent questions. Please call the main number to the clinic Dept: [phone] and follow the prompts.   For any non-urgent questions, you may also contact your provider using MyChart. We now offer e-Visits for anyone 18 and older to request care online for non-urgent symptoms. For details visit [email or web address].   Also download the MyChart app! Go to the app store, search "MyChart", open the app, select [facility] [place], and log in with your MyChart username and password.  Masks are optional in the cancer centers. If you would like for your care team to wear a mask while they are taking care of you, please let them know. For doctor visits, patients may have with them one support person who is at least 72 years old. At this time, visitors are not allowed in the infusion area. 

## 2022-01-21 NOTE — Assessment & Plan Note (Signed)
She has intermittent elevated serum creatinine She admits she is not drinking enough liquids We discussed the importance of adequate hydration because elevated serum creatinine cannot affect interpretation of myeloma panel She will try her best to drink more liquids

## 2022-01-21 NOTE — Progress Notes (Signed)
[place] OFFICE PROGRESS NOTE  Patient Care Team: Josetta Huddle, MD as PCP - General (Internal Medicine) Buford Dresser, MD as PCP - Cardiology (Cardiology) Ginette Pitman, MD as Consulting Physician (Hematology and Oncology) Hessie Dibble, MD as Consulting Physician (Hematology and Oncology)  ASSESSMENT & PLAN:  Multiple myeloma in relapse [facility]) I reviewed recent myeloma panel with the patient She has fluctuation of M protein and light chain studies She has intermittent elevated serum creatinine We discussed that elevated serum creatinine/dehydration can affect interpretation of her myeloma panel For now, I do not plan to change her treatment She will continue treatment as scheduled I will see her again next month for further follow-up  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, 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 Observe closely This is not the cause of her dizziness  Postural dizziness Her postural dizziness is slightly worse likely secondary to dehydration I recommend she continues follow-up with neurologist  Chronic kidney disease, stage III (moderate) She has intermittent elevated serum creatinine She admits she is not drinking enough liquids We discussed the importance of adequate hydration because elevated serum creatinine cannot affect interpretation of myeloma panel She will try her best to drink more liquids  No orders of the defined types were placed in this encounter.   All questions were answered. The patient knows to call the clinic with any problems, questions or concerns. The total time spent in the appointment was 20 minutes encounter with patients including review of chart and various tests results, discussions about plan of care and coordination of care plan   [name], MD 01/21/2022 1:35 PM  INTERVAL HISTORY: Please see below for problem oriented charting. she returns for treatment  follow-up on treatment with Pomalyst and daratumumab for recurrent myeloma She had worsening dizziness recently When I question the amount of oral liquid she drinks per day, she is nowhere near 8 cups of fluid per day Denies recent diarrhea No recent falls No recent infection  REVIEW OF SYSTEMS:   Constitutional: Denies fevers, chills or abnormal weight loss Eyes: Denies blurriness of vision Ears, nose, mouth, throat, and face: Denies mucositis or sore throat Respiratory: Denies cough, dyspnea or wheezes Cardiovascular: Denies palpitation, chest discomfort or lower extremity swelling Gastrointestinal:  Denies nausea, heartburn or change in bowel habits Skin: Denies abnormal skin rashes Lymphatics: Denies new lymphadenopathy or easy bruising Neurological:Denies numbness, tingling or new weaknesses Behavioral/Psych: Mood is stable, no new changes  All other systems were reviewed with the patient and are negative.  I have reviewed the past medical history, past surgical history, social history and family history with the patient and they are unchanged from previous note.  ALLERGIES:  is allergic to augmentin [amoxicillin-pot clavulanate], albuterol, codeine, ibuprofen, nsaids, tolmetin, and tetanus-diphth-acell pertussis.  MEDICATIONS:  Current Outpatient Medications  Medication Sig Dispense Refill   acetaminophen (TYLENOL) 500 MG tablet Take 500-1,000 mg by mouth every 6 (six) hours as needed for mild pain, moderate pain, fever or headache.     acyclovir (ZOVIRAX) 400 MG tablet Take 1 tablet (400 mg total) by mouth 2 (two) times daily. 180 tablet 3   aspirin EC 81 MG tablet Take 81 mg by mouth daily with breakfast.     calcium carbonate (OSCAL) 1500 (600 Ca) MG TABS tablet Take 1,500 mg by mouth daily with breakfast.     Calcium Carbonate 500 MG CHEW Chew 500 mg by mouth as needed for indigestion. (Patient not taking: Reported  on 07/06/2021)     Cholecalciferol (VITAMIN D3) 2000 units  capsule Take 2,000 Units by mouth daily.      loratadine (CLARITIN) 10 MG tablet Take 10 mg by mouth daily.     Multiple Vitamins-Minerals (CENTRUM SILVER PO) Take 1 tablet by mouth daily.      omeprazole (PRILOSEC) 20 MG capsule TAKE 1 CAPSULE(20 MG) BY MOUTH DAILY 90 capsule 11   ondansetron (ZOFRAN) 8 MG tablet TAKE 1 TABLET(8 MG) BY MOUTH EVERY 8 HOURS AS NEEDED FOR NAUSEA OR VOMITING 60 tablet 11   pomalidomide (POMALYST) 2 MG capsule Take 1 pill daily for 14 days then rest 7 days for cycle of every 21 days 14 capsule 0   senna-docusate (SENOKOT-S) 8.6-50 MG tablet Take 1-2 tablets by mouth daily as needed for mild constipation or moderate constipation.     sodium chloride (OCEAN) 0.65 % SOLN nasal spray Place 1 spray into both nostrils as needed for congestion.     vitamin B-12 (CYANOCOBALAMIN) 500 MCG tablet Take 500 mcg by mouth daily.     No current facility-administered medications for this visit.   Facility-Administered Medications Ordered in Other Visits  Medication Dose Route Frequency Provider Last Rate Last Admin   0.9 %  sodium chloride infusion   Intravenous Once Alvy Bimler, Dmiya Malphrus, MD        SUMMARY OF ONCOLOGIC HISTORY: Oncology History Overview Note   M-protein 0.69 gm/dl IFIX - IgG, Kappa IgG - 868 IgA - 19 IgM - < 20 Kappa - 21 Lambda - 5.7  09/06/2014 - Bone marrow aspirate and biopsy:   Normocellular marrow for age (40%) with a small monoclonal plasma cell population (1% on aspirate). Karyotype 24, XX  FISH Negative for myeloma associated changes  09/12/2014 - PET/CT  Two regions that are concerning for disease, one adjacent/involving the left ninth rib and one in the marrow of the right femur, in this patient with history of plasmacytoma.    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   04/01/2015 Bone Marrow Transplant   She received melphalan chemotherapy on 03/31/2015 followed by autologous stem cell transplant the day after   04/03/2015 - 04/18/2015 Hospital Admission   The patient was admitted to the hospital at [place] for management related to complication from stem cell transplant. She had significant nausea requiring intravenous anti-emetics.   07/17/2015 -  Chemotherapy   She started maintenance Revlimid and monthly zometa, then every 3 months  06/01/2018 Imaging   DEXA scan showed bone density T score in femur -2.3   04/20/2019 PET scan   1. No FDG avid osseous lesions or mass identified to suggest metabolically active lesion of myeloma or plasmacytoma. 2. Small nodular density within the paravertebral right lower lobe exhibits mild to moderate increased uptake within SUV max of 3.38. This is indeterminate. Review of CT chest from 10/05/2018 shows a corresponding Lung nodule in this area measuring the same. Small pulmonary neoplasm cannot be excluded. 3. Indeterminate, focal area of increased uptake is identified within the thoracic canal. Indeterminate favored to represent benign physiologic CNS  activity.     04/30/2019 -  Chemotherapy   Patient is on Treatment Plan : MYELOMA SQ Daratumumab Faspro q28d     04/30/2019 - 07/09/2019 Chemotherapy   The patient had bortezomib SQ (VELCADE) chemo injection 1.75 mg, 1.3 mg/m2 = 1.75 mg, Subcutaneous,  Once, 10 of 11 cycles Administration: 1.75 mg (04/30/2019), 1.75 mg (05/07/2019), 1.75 mg (05/14/2019), 1.75 mg (05/21/2019), 1.75 mg (05/28/2019), 1.75 mg (06/04/2019), 1.75 mg (06/11/2019), 1.75 mg (06/18/2019), 1.75 mg (06/25/2019), 1.75 mg (07/09/2019)  for chemotherapy treatment.      PHYSICAL EXAMINATION: ECOG PERFORMANCE STATUS: 2 - Symptomatic, <50% confined to bed  Vitals:   01/21/22 1044  BP: (!) 125/98  Temp: 98.7 F (37.1 C)   Filed Weights   01/21/22 1044  Weight: 110 lb 9.6 oz (50.2 kg)    GENERAL:alert, no distress and comfortable.  She is thin and frail NEURO: alert & oriented x 3 with fluent speech, no focal motor/sensory deficits  LABORATORY DATA:  I have reviewed the data as listed    Component Value Date/Time   NA 140 01/21/2022 1033   NA 143 07/14/2017 1245   K 3.6 01/21/2022 1033   K 3.3 (L) 07/14/2017 1245   CL 108 01/21/2022 1033   CO2 26 01/21/2022 1033   CO2 20 (L) 07/14/2017 1245   GLUCOSE 84 01/21/2022 1033   GLUCOSE 106 07/14/2017 1245   BUN 10 01/21/2022 1033   BUN 9.3 07/14/2017 1245   CREATININE 0.92 01/21/2022 1033   CREATININE 1.01 (H) 01/27/2021 0847   CREATININE 0.8 07/14/2017 1245   CALCIUM 9.6 01/21/2022 1033   CALCIUM 8.4 07/14/2017 1245   PROT 7.0 01/21/2022 1033   PROT 6.0 07/14/2017 1245   PROT 6.2 (L) 07/14/2017 1245   ALBUMIN 4.2 01/21/2022 1033   ALBUMIN 3.6 07/14/2017 1245   AST 14 (L) 01/21/2022 1033   AST 21 01/27/2021 0847   AST 21 07/14/2017 1245   ALT 9 01/21/2022 1033   ALT 14 01/27/2021 0847   ALT 21 07/14/2017 1245   ALKPHOS 59 01/21/2022 1033   ALKPHOS 78 07/14/2017 1245   BILITOT 0.7 01/21/2022 1033   BILITOT 0.6 01/27/2021 0847   BILITOT 0.43 07/14/2017  1245   GFRNONAA >60 01/21/2022 1033   GFRNONAA 60 (L) 01/27/2021 0847   GFRAA 57 (L) 04/21/2020 0817   GFRAA >60 01/14/2020 0828    No results found for: "SPEP", "UPEP"  Lab Results  Component Value Date   WBC 4.3 01/21/2022   NEUTROABS 2.0 01/21/2022   HGB 10.3 (L) 01/21/2022   HCT 31.4 (L) 01/21/2022   MCV 88.2 01/21/2022   PLT 174 01/21/2022      Chemistry      Component Value Date/Time   NA 140 01/21/2022 1033   NA 143 07/14/2017 1245   K 3.6 01/21/2022 1033   K 3.3 (L) 07/14/2017 1245  CL 108 01/21/2022 1033   CO2 26 01/21/2022 1033   CO2 20 (L) 07/14/2017 1245   BUN 10 01/21/2022 1033   BUN 9.3 07/14/2017 1245   CREATININE 0.92 01/21/2022 1033   CREATININE 1.01 (H) 01/27/2021 0847   CREATININE 0.8 07/14/2017 1245      Component Value Date/Time   CALCIUM 9.6 01/21/2022 1033   CALCIUM 8.4 07/14/2017 1245   ALKPHOS 59 01/21/2022 1033   ALKPHOS 78 07/14/2017 1245   AST 14 (L) 01/21/2022 1033   AST 21 01/27/2021 0847   AST 21 07/14/2017 1245   ALT 9 01/21/2022 1033   ALT 14 01/27/2021 0847   ALT 21 07/14/2017 1245   BILITOT 0.7 01/21/2022 1033   BILITOT 0.6 01/27/2021 0847   BILITOT 0.43 07/14/2017 1245

## 2022-01-21 NOTE — Assessment & Plan Note (Signed)
She has chronic intermittent pancytopenia, 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 Observe closely This is not the cause of her dizziness 

## 2022-01-22 LAB — KAPPA/LAMBDA LIGHT CHAINS
Kappa free light chain: 30.5 mg/L — ABNORMAL HIGH (ref 3.3–19.4)
Kappa, lambda light chain ratio: 8.03 — ABNORMAL HIGH (ref 0.26–1.65)
Lambda free light chains: 3.8 mg/L — ABNORMAL LOW (ref 5.7–26.3)

## 2022-01-26 ENCOUNTER — Telehealth: Payer: Self-pay

## 2022-01-26 ENCOUNTER — Other Ambulatory Visit: Payer: Self-pay

## 2022-01-26 DIAGNOSIS — C9002 Multiple myeloma in relapse: Secondary | ICD-10-CM

## 2022-01-26 LAB — MULTIPLE MYELOMA PANEL, SERUM
Albumin SerPl Elph-Mcnc: 3.6 g/dL (ref 2.9–4.4)
Albumin/Glob SerPl: 1.3 (ref 0.7–1.7)
Alpha 1: 0.3 g/dL (ref 0.0–0.4)
Alpha2 Glob SerPl Elph-Mcnc: 0.8 g/dL (ref 0.4–1.0)
B-Globulin SerPl Elph-Mcnc: 1.1 g/dL (ref 0.7–1.3)
Gamma Glob SerPl Elph-Mcnc: 0.8 g/dL (ref 0.4–1.8)
Globulin, Total: 3 g/dL (ref 2.2–3.9)
IgA: 15 mg/dL — ABNORMAL LOW (ref 64–422)
IgG (Immunoglobin G), Serum: 820 mg/dL (ref 586–1602)
IgM (Immunoglobulin M), Srm: <5 mg/dL — ABNORMAL LOW (ref 26–217)
M Protein SerPl Elph-Mcnc: 0.7 g/dL — ABNORMAL HIGH
Total Protein ELP: 6.6 g/dL (ref 6.0–8.5)

## 2022-01-26 MED ORDER — POMALIDOMIDE 2 MG PO CAPS: ORAL_CAPSULE | ORAL | 0 refills | Status: DC

## 2022-01-26 NOTE — Telephone Encounter (Signed)
Called and given below message. She verbalized understanding and is agreeable to increased dose of Pomalyst. Rx sent to pharmacy.  She is laying in the bed today not feeling well. C/o dizziness and nausea. She has taken Zofran. She had PCP appt today but she canceled due to not feeling well. Encouraged her reschedule PCP appt. Denies problems eating and drinking.    FYI

## 2022-01-26 NOTE — Telephone Encounter (Signed)
-----   Message from [name], MD sent at 01/26/2022 11:54 AM EDT ----- Pls tell her myeloma panel is a bit worse I recommend changing her pomalyst to 21 days on 7 days off If she agrees, document the change and send in new prescription to biologics

## 2022-02-19 ENCOUNTER — Other Ambulatory Visit: Payer: Self-pay

## 2022-02-19 ENCOUNTER — Inpatient Hospital Stay: Payer: Medicare Other

## 2022-02-19 ENCOUNTER — Inpatient Hospital Stay: Payer: Medicare Other | Attending: Hematology and Oncology

## 2022-02-19 ENCOUNTER — Encounter: Payer: Self-pay | Admitting: Hematology and Oncology

## 2022-02-19 ENCOUNTER — Inpatient Hospital Stay (HOSPITAL_BASED_OUTPATIENT_CLINIC_OR_DEPARTMENT_OTHER): Payer: Medicare Other | Admitting: Hematology and Oncology

## 2022-02-19 DIAGNOSIS — Z7952 Long term (current) use of systemic steroids: Secondary | ICD-10-CM | POA: Diagnosis not present

## 2022-02-19 DIAGNOSIS — Z7969 Long term (current) use of other immunomodulators and immunosuppressants: Secondary | ICD-10-CM | POA: Diagnosis not present

## 2022-02-19 DIAGNOSIS — C9002 Multiple myeloma in relapse: Secondary | ICD-10-CM

## 2022-02-19 DIAGNOSIS — Z880 Allergy status to penicillin: Secondary | ICD-10-CM | POA: Diagnosis not present

## 2022-02-19 DIAGNOSIS — Z887 Allergy status to serum and vaccine status: Secondary | ICD-10-CM | POA: Insufficient documentation

## 2022-02-19 DIAGNOSIS — Z885 Allergy status to narcotic agent status: Secondary | ICD-10-CM | POA: Diagnosis not present

## 2022-02-19 DIAGNOSIS — J984 Other disorders of lung: Secondary | ICD-10-CM | POA: Diagnosis not present

## 2022-02-19 DIAGNOSIS — Z9484 Stem cells transplant status: Secondary | ICD-10-CM | POA: Insufficient documentation

## 2022-02-19 DIAGNOSIS — Z7961 Long term (current) use of immunomodulator: Secondary | ICD-10-CM | POA: Diagnosis not present

## 2022-02-19 DIAGNOSIS — G629 Polyneuropathy, unspecified: Secondary | ICD-10-CM | POA: Insufficient documentation

## 2022-02-19 DIAGNOSIS — Z881 Allergy status to other antibiotic agents status: Secondary | ICD-10-CM | POA: Insufficient documentation

## 2022-02-19 DIAGNOSIS — Z79624 Long term (current) use of inhibitors of nucleotide synthesis: Secondary | ICD-10-CM | POA: Diagnosis not present

## 2022-02-19 DIAGNOSIS — R42 Dizziness and giddiness: Secondary | ICD-10-CM | POA: Diagnosis not present

## 2022-02-19 DIAGNOSIS — D61818 Other pancytopenia: Secondary | ICD-10-CM | POA: Insufficient documentation

## 2022-02-19 DIAGNOSIS — Z886 Allergy status to analgesic agent status: Secondary | ICD-10-CM | POA: Diagnosis not present

## 2022-02-19 DIAGNOSIS — Z79899 Other long term (current) drug therapy: Secondary | ICD-10-CM | POA: Diagnosis not present

## 2022-02-19 LAB — CBC WITH DIFFERENTIAL/PLATELET
Abs Immature Granulocytes: 0.04 10*3/uL (ref 0.00–0.07)
Basophils Absolute: 0.1 10*3/uL (ref 0.0–0.1)
Basophils Relative: 2 %
Eosinophils Absolute: 1 10*3/uL — ABNORMAL HIGH (ref 0.0–0.5)
Eosinophils Relative: 27 %
HCT: 31.5 % — ABNORMAL LOW (ref 36.0–46.0)
Hemoglobin: 10.1 g/dL — ABNORMAL LOW (ref 12.0–15.0)
Immature Granulocytes: 1 %
Lymphocytes Relative: 47 %
Lymphs Abs: 1.7 10*3/uL (ref 0.7–4.0)
MCH: 28.2 pg (ref 26.0–34.0)
MCHC: 32.1 g/dL (ref 30.0–36.0)
MCV: 88 fL (ref 80.0–100.0)
Monocytes Absolute: 0.3 10*3/uL (ref 0.1–1.0)
Monocytes Relative: 9 %
Neutro Abs: 0.5 10*3/uL — ABNORMAL LOW (ref 1.7–7.7)
Neutrophils Relative %: 14 %
Platelets: 183 10*3/uL (ref 150–400)
RBC: 3.58 MIL/uL — ABNORMAL LOW (ref 3.87–5.11)
RDW: 17.3 % — ABNORMAL HIGH (ref 11.5–15.5)
WBC: 3.7 10*3/uL — ABNORMAL LOW (ref 4.0–10.5)
nRBC: 0 % (ref 0.0–0.2)

## 2022-02-19 LAB — COMPREHENSIVE METABOLIC PANEL
ALT: 6 U/L (ref 0–44)
AST: 11 U/L — ABNORMAL LOW (ref 15–41)
Albumin: 4.1 g/dL (ref 3.5–5.0)
Alkaline Phosphatase: 55 U/L (ref 38–126)
Anion gap: 5 (ref 5–15)
BUN: 11 mg/dL (ref 8–23)
CO2: 28 mmol/L (ref 22–32)
Calcium: 9.4 mg/dL (ref 8.9–10.3)
Chloride: 106 mmol/L (ref 98–111)
Creatinine, Ser: 0.89 mg/dL (ref 0.44–1.00)
GFR, Estimated: >60 mL/min (ref >60)
Glucose, Bld: 88 mg/dL (ref 70–99)
Potassium: 3.3 mmol/L — ABNORMAL LOW (ref 3.5–5.1)
Sodium: 139 mmol/L (ref 135–145)
Total Bilirubin: 0.9 mg/dL (ref 0.3–1.2)
Total Protein: 6.9 g/dL (ref 6.5–8.1)

## 2022-02-19 NOTE — Assessment & Plan Note (Signed)
She has severe pancytopenia due to treatment She is not able to tolerate further dose change We will have to change her to a different treatment if confirmed disease progression

## 2022-02-19 NOTE — Progress Notes (Signed)
[place] OFFICE PROGRESS NOTE  Patient Care Team: Josetta Huddle, MD as PCP - General (Internal Medicine) Buford Dresser, MD as PCP - Cardiology (Cardiology) Ginette Pitman, MD as Consulting Physician (Hematology and Oncology) Hessie Dibble, MD as Consulting Physician (Hematology and Oncology)  ASSESSMENT & PLAN:  Multiple myeloma in relapse [facility]) I have reviewed her last myeloma panel Her M protein is trending up She is very bothered by profound dizziness Overall, I felt that she is progressing on combination of Pomalyst and daratumumab She has progressed on Velcade and intolerant to Velcade The next line of treatment will probably be infusional carfilzomib The patient is somewhat reluctant to go on aggressive treatment due to her profound dizziness We will forego treatment today and I will call her with test results Once we confirm disease progression, I will bring her back with her husband to discuss next treatment option  Pancytopenia, acquired ([place]) She has severe pancytopenia due to treatment She is not able to tolerate further dose change We will have to change her to a different treatment if confirmed disease progression  Postural dizziness She has severe postural dizziness I reviewed her case with neuro oncologist who felt that her symptoms could be related to 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   All questions were answered. The patient knows to call the clinic with any problems, questions or concerns. The total time spent in the appointment was 30 minutes encounter with patients including review of chart and  various tests results, discussions about plan of care and coordination of care plan   [name], MD 02/19/2022 1:18 PM  INTERVAL HISTORY: Please see below for problem oriented charting. she returns for treatment follow-up with her husband She is very tearful today She felt bad She is profoundly dizzy and have difficulties with postural changes She denies recent falls No recent fever or chills We reviewed test results and she is disappointed to see that her myeloma panel is worse  REVIEW OF SYSTEMS:   Constitutional: Denies fevers, chills or abnormal weight loss Eyes: Denies blurriness of vision Ears, nose, mouth, throat, and face: Denies mucositis or sore throat Respiratory: Denies cough, dyspnea or wheezes Cardiovascular: Denies palpitation, chest discomfort or lower extremity swelling Gastrointestinal:  Denies nausea, heartburn or change in bowel habits Skin: Denies abnormal skin rashes Lymphatics: Denies new lymphadenopathy or easy bruising Behavioral/Psych: Mood is stable, no new changes  All other systems were reviewed with the patient and are negative.  I have reviewed the past medical history, past surgical history, social history and family history with the patient and they are unchanged from previous note.  ALLERGIES:  is allergic to augmentin [amoxicillin-pot clavulanate], albuterol, codeine, ibuprofen, nsaids, tolmetin, and tetanus-diphth-acell pertussis.  MEDICATIONS:  Current Outpatient Medications  Medication Sig Dispense Refill   acetaminophen (TYLENOL) 500 MG tablet Take 500-1,000 mg by mouth every 6 (six) hours as needed for mild pain, moderate pain, fever or headache.     acyclovir (ZOVIRAX) 400 MG tablet Take 1 tablet (400 mg total) by mouth 2 (two) times daily. 180 tablet 3   aspirin EC 81 MG tablet Take 81 mg  by mouth daily with breakfast.     calcium carbonate (OSCAL) 1500 (600 Ca) MG TABS tablet Take 1,500 mg by mouth daily with breakfast.     Calcium  Carbonate 500 MG CHEW Chew 500 mg by mouth as needed for indigestion. (Patient not taking: Reported on 07/06/2021)     Cholecalciferol (VITAMIN D3) 2000 units capsule Take 2,000 Units by mouth daily.      loratadine (CLARITIN) 10 MG tablet Take 10 mg by mouth daily.     Multiple Vitamins-Minerals (CENTRUM SILVER PO) Take 1 tablet by mouth daily.      omeprazole (PRILOSEC) 20 MG capsule TAKE 1 CAPSULE(20 MG) BY MOUTH DAILY 90 capsule 11   ondansetron (ZOFRAN) 8 MG tablet TAKE 1 TABLET(8 MG) BY MOUTH EVERY 8 HOURS AS NEEDED FOR NAUSEA OR VOMITING 60 tablet 11   pomalidomide (POMALYST) 2 MG capsule Take 1 pill daily for 21 days then rest 7 days for cycle of every 28 days 21 capsule 0   senna-docusate (SENOKOT-S) 8.6-50 MG tablet Take 1-2 tablets by mouth daily as needed for mild constipation or moderate constipation.     sodium chloride (OCEAN) 0.65 % SOLN nasal spray Place 1 spray into both nostrils as needed for congestion.     vitamin B-12 (CYANOCOBALAMIN) 500 MCG tablet Take 500 mcg by mouth daily.     No current facility-administered medications for this visit.   Facility-Administered Medications Ordered in Other Visits  Medication Dose Route Frequency Provider Last Rate Last Admin   0.9 %  sodium chloride infusion   Intravenous Once Alvy Bimler, Charita Lindenberger, MD        SUMMARY OF ONCOLOGIC HISTORY: Oncology History Overview Note   M-protein 0.69 gm/dl IFIX - IgG, Kappa IgG - 868 IgA - 19 IgM - < 20 Kappa - 21 Lambda - 5.7  09/06/2014 - Bone marrow aspirate and biopsy:   Normocellular marrow for age (40%) with a small monoclonal plasma cell population (1% on aspirate). Karyotype 73, XX  FISH Negative for myeloma associated changes  09/12/2014 - PET/CT  Two regions that are concerning for disease, one adjacent/involving the left ninth rib and one in the marrow of the right femur, in this patient with history of plasmacytoma.    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   04/01/2015 Bone Marrow Transplant   She received melphalan chemotherapy on 03/31/2015 followed by autologous stem cell transplant the day after   04/03/2015 - 04/18/2015 Hospital Admission  The patient was admitted to the hospital at [place] for management related to complication from stem cell transplant. She had significant nausea requiring intravenous anti-emetics.   07/17/2015 -  Chemotherapy   She started maintenance Revlimid and monthly zometa, then every 3 months   06/01/2018 Imaging   DEXA scan showed bone density T score in femur -2.3   04/20/2019 PET scan   1. No FDG avid osseous lesions or mass identified to suggest metabolically active lesion of myeloma or plasmacytoma. 2. Small nodular density within the paravertebral right lower lobe exhibits mild to moderate increased uptake within SUV max of 3.38. This is indeterminate. Review of CT chest from 10/05/2018 shows a corresponding Lung nodule in this area measuring the same. Small pulmonary neoplasm cannot be  excluded. 3. Indeterminate, focal area of increased uptake is identified within the thoracic canal. Indeterminate favored to represent benign physiologic CNS activity.     04/30/2019 -  Chemotherapy   Patient is on Treatment Plan : MYELOMA SQ Daratumumab Faspro q28d     04/30/2019 - 07/09/2019 Chemotherapy   The patient had bortezomib SQ (VELCADE) chemo injection 1.75 mg, 1.3 mg/m2 = 1.75 mg, Subcutaneous,  Once, 10 of 11 cycles Administration: 1.75 mg (04/30/2019), 1.75 mg (05/07/2019), 1.75 mg (05/14/2019), 1.75 mg (05/21/2019), 1.75 mg (05/28/2019), 1.75 mg (06/04/2019), 1.75 mg (06/11/2019), 1.75 mg (06/18/2019), 1.75 mg (06/25/2019), 1.75 mg (07/09/2019)  for chemotherapy treatment.      PHYSICAL EXAMINATION: ECOG PERFORMANCE STATUS: 2 - Symptomatic, <50% confined to bed  Vitals:   02/19/22 1019  BP: 131/80  Pulse: 73  Resp: 18  Temp: 98.6 F (37 C)  SpO2: 100%   Filed Weights   02/19/22 1019  Weight: 108 lb 6.4 oz (49.2 kg)    GENERAL:alert, no distress and comfortable NEURO: alert & oriented x 3 with fluent speech, no focal motor/sensory deficits  LABORATORY DATA:  I have reviewed the data as listed    Component Value Date/Time   NA 139 02/19/2022 0955   NA 143 07/14/2017 1245   K 3.3 (L) 02/19/2022 0955   K 3.3 (L) 07/14/2017 1245   CL 106 02/19/2022 0955   CO2 28 02/19/2022 0955   CO2 20 (L) 07/14/2017 1245   GLUCOSE 88 02/19/2022 0955   GLUCOSE 106 07/14/2017 1245   BUN 11 02/19/2022 0955   BUN 9.3 07/14/2017 1245   CREATININE 0.89 02/19/2022 0955   CREATININE 1.01 (H) 01/27/2021 0847   CREATININE 0.8 07/14/2017 1245   CALCIUM 9.4 02/19/2022 0955   CALCIUM 8.4 07/14/2017 1245   PROT 6.9 02/19/2022 0955   PROT 6.0 07/14/2017 1245   PROT 6.2 (L) 07/14/2017 1245   ALBUMIN 4.1 02/19/2022 0955   ALBUMIN 3.6 07/14/2017 1245   AST 11 (L) 02/19/2022 0955   AST 21 01/27/2021 0847   AST 21 07/14/2017 1245   ALT 6 02/19/2022 0955   ALT 14 01/27/2021 0847   ALT  21 07/14/2017 1245   ALKPHOS 55 02/19/2022 0955   ALKPHOS 78 07/14/2017 1245   BILITOT 0.9 02/19/2022 0955   BILITOT 0.6 01/27/2021 0847   BILITOT 0.43 07/14/2017 1245   GFRNONAA >60 02/19/2022 0955   GFRNONAA 60 (L) 01/27/2021 0847   GFRAA 57 (L) 04/21/2020 0817   GFRAA >60 01/14/2020 0828    No results found for: "SPEP", "UPEP"  Lab Results  Component Value Date   WBC 3.7 (L) 02/19/2022   NEUTROABS 0.5 (L) 02/19/2022   HGB 10.1 (L) 02/19/2022   HCT  31.5 (L) 02/19/2022   MCV 88.0 02/19/2022   PLT 183 02/19/2022      Chemistry      Component Value Date/Time   NA 139 02/19/2022 0955   NA 143 07/14/2017 1245   K 3.3 (L) 02/19/2022 0955   K 3.3 (L) 07/14/2017 1245   CL 106 02/19/2022 0955   CO2 28 02/19/2022 0955   CO2 20 (L) 07/14/2017 1245   BUN 11 02/19/2022 0955   BUN 9.3 07/14/2017 1245   CREATININE 0.89 02/19/2022 0955   CREATININE 1.01 (H) 01/27/2021 0847   CREATININE 0.8 07/14/2017 1245      Component Value Date/Time   CALCIUM 9.4 02/19/2022 0955   CALCIUM 8.4 07/14/2017 1245   ALKPHOS 55 02/19/2022 0955   ALKPHOS 78 07/14/2017 1245   AST 11 (L) 02/19/2022 0955   AST 21 01/27/2021 0847   AST 21 07/14/2017 1245   ALT 6 02/19/2022 0955   ALT 14 01/27/2021 0847   ALT 21 07/14/2017 1245   BILITOT 0.9 02/19/2022 0955   BILITOT 0.6 01/27/2021 0847   BILITOT 0.43 07/14/2017 1245

## 2022-02-19 NOTE — Assessment & Plan Note (Signed)
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

## 2022-02-19 NOTE — Assessment & Plan Note (Signed)
I have reviewed her last myeloma panel Her M protein is trending up She is very bothered by profound dizziness Overall, I felt that she is progressing on combination of Pomalyst and daratumumab She has progressed on Velcade and intolerant to Velcade The next line of treatment will probably be infusional carfilzomib The patient is somewhat reluctant to go on aggressive treatment due to her profound dizziness We will forego treatment today and I will call her with test results Once we confirm disease progression, I will bring her back with her husband to discuss next treatment option

## 2022-02-22 ENCOUNTER — Other Ambulatory Visit: Payer: Self-pay | Admitting: *Deleted

## 2022-02-22 DIAGNOSIS — C9002 Multiple myeloma in relapse: Secondary | ICD-10-CM

## 2022-02-22 LAB — KAPPA/LAMBDA LIGHT CHAINS
Kappa free light chain: 36 mg/L — ABNORMAL HIGH (ref 3.3–19.4)
Kappa, lambda light chain ratio: 9.73 — ABNORMAL HIGH (ref 0.26–1.65)
Lambda free light chains: 3.7 mg/L — ABNORMAL LOW (ref 5.7–26.3)

## 2022-02-22 NOTE — Telephone Encounter (Signed)
Patient called to request refill of Pomalyst Request routed to Dr. Alvy Bimler

## 2022-02-23 NOTE — Telephone Encounter (Signed)
Hi Erin,  I will decline refill because we are waiting for myeloma panel results We might have to switch treatment is tests are not good

## 2022-02-25 ENCOUNTER — Telehealth: Payer: Self-pay

## 2022-02-25 LAB — MULTIPLE MYELOMA PANEL, SERUM
Albumin SerPl Elph-Mcnc: 3.5 g/dL (ref 2.9–4.4)
Albumin/Glob SerPl: 1.2 (ref 0.7–1.7)
Alpha 1: 0.3 g/dL (ref 0.0–0.4)
Alpha2 Glob SerPl Elph-Mcnc: 0.7 g/dL (ref 0.4–1.0)
B-Globulin SerPl Elph-Mcnc: 1 g/dL (ref 0.7–1.3)
Gamma Glob SerPl Elph-Mcnc: 0.9 g/dL (ref 0.4–1.8)
Globulin, Total: 3 g/dL (ref 2.2–3.9)
IgA: 17 mg/dL — ABNORMAL LOW (ref 64–422)
IgG (Immunoglobin G), Serum: 921 mg/dL (ref 586–1602)
IgM (Immunoglobulin M), Srm: <5 mg/dL — ABNORMAL LOW (ref 26–217)
M Protein SerPl Elph-Mcnc: 0.8 g/dL — ABNORMAL HIGH
Total Protein ELP: 6.5 g/dL (ref 6.0–8.5)

## 2022-02-25 NOTE — Telephone Encounter (Signed)
Called and spoke with pt, informed her that based on her lab work we would like for her to come in for further discussion of her current treatment plan, offered appt of 7/24 @ 12, pt agrees and therefore I have scheduled.

## 2022-03-01 ENCOUNTER — Inpatient Hospital Stay (HOSPITAL_BASED_OUTPATIENT_CLINIC_OR_DEPARTMENT_OTHER): Payer: Medicare Other | Admitting: Hematology and Oncology

## 2022-03-01 ENCOUNTER — Encounter: Payer: Self-pay | Admitting: Hematology and Oncology

## 2022-03-01 ENCOUNTER — Other Ambulatory Visit: Payer: Self-pay

## 2022-03-01 VITALS — BP 117/84 | HR 85 | Temp 98.0°F | Resp 18 | Ht 60.0 in | Wt 104.8 lb

## 2022-03-01 DIAGNOSIS — R42 Dizziness and giddiness: Secondary | ICD-10-CM

## 2022-03-01 DIAGNOSIS — Z7189 Other specified counseling: Secondary | ICD-10-CM | POA: Diagnosis not present

## 2022-03-01 DIAGNOSIS — C9002 Multiple myeloma in relapse: Secondary | ICD-10-CM

## 2022-03-01 DIAGNOSIS — Z9481 Bone marrow transplant status: Secondary | ICD-10-CM

## 2022-03-01 MED ORDER — LIDOCAINE-PRILOCAINE 2.5-2.5 % EX CREA: TOPICAL_CREAM | CUTANEOUS | 3 refills | Status: DC

## 2022-03-01 MED ORDER — ACYCLOVIR 400 MG PO TABS: 400.0000 mg | ORAL_TABLET | Freq: Every day | ORAL | 3 refills | Status: DC

## 2022-03-01 MED ORDER — PROCHLORPERAZINE MALEATE 10 MG PO TABS: 10.0000 mg | ORAL_TABLET | Freq: Four times a day (QID) | ORAL | 1 refills | Status: DC | PRN

## 2022-03-01 MED ORDER — ONDANSETRON HCL 8 MG PO TABS: 8.0000 mg | ORAL_TABLET | Freq: Two times a day (BID) | ORAL | 1 refills | Status: DC | PRN

## 2022-03-01 MED ORDER — DEXAMETHASONE 4 MG PO TABS: 4.0000 mg | ORAL_TABLET | Freq: Every day | ORAL | 1 refills | Status: DC

## 2022-03-01 NOTE — Progress Notes (Signed)
[place] OFFICE PROGRESS NOTE  Patient Care Team: Josetta Huddle, MD as PCP - General (Internal Medicine) Buford Dresser, MD as PCP - Cardiology (Cardiology) Ginette Pitman, MD as Consulting Physician (Hematology and Oncology) Hessie Dibble, MD as Consulting Physician (Hematology and Oncology)  ASSESSMENT & PLAN:  Multiple myeloma in relapse [facility]) I have reviewed plan of care with the patient and her husband The patient has progressed on daratumumab, pomalidomide, lenalidomide and Velcade We discussed the guidelines With her frail status, I doubt she can tolerate combination treatment We discussed risk, benefit, side effects of single agent carfilzomib and she is in agreement to proceed I recommend port placement prior to treatment initiation In the meantime, recommend daily dexamethasone to see if I can improve her symptoms of nausea, weakness and postural dizziness We discussed side effects to be expected from dexamethasone I will see her prior to cycle 1 of treatment I plan to increase the dose of carfilzomib slowly, for cycle 1, we will start at 20 mg per metered square, for cycle to 27 mg per metered square and eventually cycle 3 and onwards at 36 mg per metered square We will continue myeloma panel once a month  Postural dizziness I had extensive discussion with neuro oncologist Her postural 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 In the past, she tolerated dexamethasone well and appears to benefit from it We will give her a trial of dexamethasone 4-week I will reassess next week I gave her completed disability parking application form for disability parking placard  Goals of care, counseling/discussion We have discussions about goals of care She is willing to proceed She is aware that treatment goal is palliative  Orders Placed This Encounter  Procedures   IR IMAGING GUIDED PORT INSERTION    Standing  Status:   Future    Standing Expiration Date:   03/02/2023    Order Specific Question:   Reason for Exam (SYMPTOM  OR DIAGNOSIS REQUIRED)    Answer:   need port for chemo    Order Specific Question:   Preferred Imaging Location?    Answer:   [facility]   CBC with Differential ([place] Only)    Standing Status:   Standing    Number of Occurrences:   20    Standing Expiration Date:   03/02/2023   CMP ([place] only)    Standing Status:   Standing    Number of Occurrences:   20    Standing Expiration Date:   03/02/2023    All questions were answered. The patient knows to call the clinic with any problems, questions or concerns. The total time spent in the appointment was 40 minutes encounter with patients including review of chart and various tests results, discussions about plan of care and coordination of care plan   [name], MD 03/01/2022 1:02 PM  INTERVAL HISTORY: Please see below for problem oriented charting. she returns for treatment follow-up with her husband She continues to feel weak and debilitated Her husband is wondering about application for disability parking placard as well as adaptive equipment at home such as a shower chair, etc.  REVIEW OF SYSTEMS:   Constitutional: Denies fevers, chills or abnormal weight loss Eyes: Denies blurriness of vision Ears, nose, mouth, throat, and face: Denies mucositis or sore throat Respiratory: Denies cough, dyspnea or wheezes Cardiovascular: Denies palpitation, chest discomfort or lower extremity swelling Gastrointestinal:  Denies nausea, heartburn or  change in bowel habits Skin: Denies abnormal skin rashes Lymphatics: Denies new lymphadenopathy or easy bruising Behavioral/Psych: Mood is stable, no new changes  All other systems were reviewed with the patient and are negative.  I have reviewed the past medical history, past surgical history, social history and family history with the patient and they are  unchanged from previous note.  ALLERGIES:  is allergic to augmentin [amoxicillin-pot clavulanate], albuterol, codeine, ibuprofen, nsaids, tolmetin, and tetanus-diphth-acell pertussis.  MEDICATIONS:  Current Outpatient Medications  Medication Sig Dispense Refill   dexamethasone (DECADRON) 4 MG tablet Take 1 tablet (4 mg total) by mouth daily. 30 tablet 1   acetaminophen (TYLENOL) 500 MG tablet Take 500-1,000 mg by mouth every 6 (six) hours as needed for mild pain, moderate pain, fever or headache.     acyclovir (ZOVIRAX) 400 MG tablet Take 1 tablet (400 mg total) by mouth 2 (two) times daily. 180 tablet 3   acyclovir (ZOVIRAX) 400 MG tablet Take 1 tablet (400 mg total) by mouth daily. 30 tablet 3   calcium carbonate (OSCAL) 1500 (600 Ca) MG TABS tablet Take 1,500 mg by mouth daily with breakfast.     Calcium Carbonate 500 MG CHEW Chew 500 mg by mouth as needed for indigestion. (Patient not taking: Reported on 07/06/2021)     Cholecalciferol (VITAMIN D3) 2000 units capsule Take 2,000 Units by mouth daily.      lidocaine-prilocaine (EMLA) cream Apply to affected area once 30 g 3   loratadine (CLARITIN) 10 MG tablet Take 10 mg by mouth daily.     Multiple Vitamins-Minerals (CENTRUM SILVER PO) Take 1 tablet by mouth daily.      omeprazole (PRILOSEC) 20 MG capsule TAKE 1 CAPSULE(20 MG) BY MOUTH DAILY 90 capsule 11   ondansetron (ZOFRAN) 8 MG tablet TAKE 1 TABLET(8 MG) BY MOUTH EVERY 8 HOURS AS NEEDED FOR NAUSEA OR VOMITING 60 tablet 11   ondansetron (ZOFRAN) 8 MG tablet Take 1 tablet (8 mg total) by mouth 2 (two) times daily as needed (Nausea or vomiting). 30 tablet 1   prochlorperazine (COMPAZINE) 10 MG tablet Take 1 tablet (10 mg total) by mouth every 6 (six) hours as needed (Nausea or vomiting). 30 tablet 1   senna-docusate (SENOKOT-S) 8.6-50 MG tablet Take 1-2 tablets by mouth daily as needed for mild constipation or moderate constipation.     sodium chloride (OCEAN) 0.65 % SOLN nasal spray Place  1 spray into both nostrils as needed for congestion.     vitamin B-12 (CYANOCOBALAMIN) 500 MCG tablet Take 500 mcg by mouth daily.     No current facility-administered medications for this visit.    SUMMARY OF ONCOLOGIC HISTORY: Oncology History Overview Note   M-protein 0.69 gm/dl IFIX - IgG, Kappa IgG - 868 IgA - 19 IgM - < 20 Kappa - 21 Lambda - 5.7  09/06/2014 - Bone marrow aspirate and biopsy:   Normocellular marrow for age (40%) with a small monoclonal plasma cell population (1% on aspirate). Karyotype 50, XX  FISH Negative for myeloma associated changes  09/12/2014 - PET/CT  Two regions that are concerning for disease, one adjacent/involving the left ninth rib and one in the marrow of the right femur, in this patient with history of plasmacytoma.    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   04/01/2015 Bone Marrow Transplant   She received melphalan chemotherapy on 03/31/2015 followed by autologous stem cell transplant the day after    04/03/2015 - 04/18/2015 Hospital Admission   The patient was admitted to the hospital at [place] for management related to complication from stem cell transplant. She had significant nausea requiring intravenous anti-emetics.   07/17/2015 -  Chemotherapy   She started maintenance Revlimid and monthly zometa, then every 3 months   06/01/2018 Imaging   DEXA scan showed bone density T score in femur -2.3   04/20/2019 PET scan   1. No FDG avid osseous lesions or mass identified to suggest metabolically active lesion of myeloma or plasmacytoma. 2. Small nodular density within the paravertebral right lower lobe exhibits mild to moderate increased uptake within SUV max of 3.38. This is indeterminate. Review of CT chest from 10/05/2018 shows a corresponding Lung nodule in this area measuring the same. Small pulmonary neoplasm cannot be excluded. 3. Indeterminate, focal area of increased uptake is identified within the thoracic canal. Indeterminate favored to represent benign physiologic CNS activity.     04/30/2019 - 01/21/2022 Chemotherapy   Patient is on Treatment Plan : MYELOMA SQ Daratumumab Faspro q28d     04/30/2019 - 07/09/2019 Chemotherapy   The patient had bortezomib SQ (VELCADE) chemo injection 1.75 mg, 1.3 mg/m2 = 1.75 mg, Subcutaneous,  Once, 10 of 11 cycles Administration: 1.75 mg (04/30/2019), 1.75 mg (05/07/2019), 1.75 mg (05/14/2019), 1.75 mg (05/21/2019), 1.75 mg (05/28/2019), 1.75 mg (06/04/2019), 1.75 mg (06/11/2019), 1.75 mg (06/18/2019), 1.75 mg (06/25/2019), 1.75 mg (07/09/2019)  for chemotherapy treatment.    03/09/2022 -  Chemotherapy   Patient is on Treatment Plan : MYELOMA RELAPSED/REFRACTORY Carfilzomib + Dexamethasone (Kd) weekly q28d       PHYSICAL EXAMINATION: ECOG PERFORMANCE STATUS: 2 - Symptomatic, <50% confined to bed  Vitals:   03/01/22 1153  BP: 117/84  Pulse: 85  Resp: 18  Temp: 98 F (36.7 C)  SpO2: 100%   Filed Weights   03/01/22 1153  Weight: 104 lb  12.8 oz (47.5 kg)    GENERAL:alert, no distress and comfortable NEURO: alert & oriented x 3 with fluent speech, no focal motor/sensory deficits  LABORATORY DATA:  I have reviewed the data as listed    Component Value Date/Time   NA 139 02/19/2022 0955   NA 143 07/14/2017 1245   K 3.3 (L) 02/19/2022 0955   K 3.3 (L) 07/14/2017 1245   CL 106 02/19/2022 0955   CO2 28 02/19/2022 0955   CO2 20 (L) 07/14/2017 1245   GLUCOSE 88 02/19/2022 0955   GLUCOSE 106 07/14/2017 1245   BUN 11 02/19/2022  0955   BUN 9.3 07/14/2017 1245   CREATININE 0.89 02/19/2022 0955   CREATININE 1.01 (H) 01/27/2021 0847   CREATININE 0.8 07/14/2017 1245   CALCIUM 9.4 02/19/2022 0955   CALCIUM 8.4 07/14/2017 1245   PROT 6.9 02/19/2022 0955   PROT 6.0 07/14/2017 1245   PROT 6.2 (L) 07/14/2017 1245   ALBUMIN 4.1 02/19/2022 0955   ALBUMIN 3.6 07/14/2017 1245   AST 11 (L) 02/19/2022 0955   AST 21 01/27/2021 0847   AST 21 07/14/2017 1245   ALT 6 02/19/2022 0955   ALT 14 01/27/2021 0847   ALT 21 07/14/2017 1245   ALKPHOS 55 02/19/2022 0955   ALKPHOS 78 07/14/2017 1245   BILITOT 0.9 02/19/2022 0955   BILITOT 0.6 01/27/2021 0847   BILITOT 0.43 07/14/2017 1245   GFRNONAA >60 02/19/2022 0955   GFRNONAA 60 (L) 01/27/2021 0847   GFRAA 57 (L) 04/21/2020 0817   GFRAA >60 01/14/2020 0828    No results found for: "SPEP", "UPEP"  Lab Results  Component Value Date   WBC 3.7 (L) 02/19/2022   NEUTROABS 0.5 (L) 02/19/2022   HGB 10.1 (L) 02/19/2022   HCT 31.5 (L) 02/19/2022   MCV 88.0 02/19/2022   PLT 183 02/19/2022      Chemistry      Component Value Date/Time   NA 139 02/19/2022 0955   NA 143 07/14/2017 1245   K 3.3 (L) 02/19/2022 0955   K 3.3 (L) 07/14/2017 1245   CL 106 02/19/2022 0955   CO2 28 02/19/2022 0955   CO2 20 (L) 07/14/2017 1245   BUN 11 02/19/2022 0955   BUN 9.3 07/14/2017 1245   CREATININE 0.89 02/19/2022 0955   CREATININE 1.01 (H) 01/27/2021 0847   CREATININE 0.8 07/14/2017 1245       Component Value Date/Time   CALCIUM 9.4 02/19/2022 0955   CALCIUM 8.4 07/14/2017 1245   ALKPHOS 55 02/19/2022 0955   ALKPHOS 78 07/14/2017 1245   AST 11 (L) 02/19/2022 0955   AST 21 01/27/2021 0847   AST 21 07/14/2017 1245   ALT 6 02/19/2022 0955   ALT 14 01/27/2021 0847   ALT 21 07/14/2017 1245   BILITOT 0.9 02/19/2022 0955   BILITOT 0.6 01/27/2021 0847   BILITOT 0.43 07/14/2017 1245

## 2022-03-01 NOTE — Assessment & Plan Note (Signed)
I have reviewed plan of care with the patient and her husband The patient has progressed on daratumumab, pomalidomide, lenalidomide and Velcade We discussed the guidelines With her frail status, I doubt she can tolerate combination treatment We discussed risk, benefit, side effects of single agent carfilzomib and she is in agreement to proceed I recommend port placement prior to treatment initiation In the meantime, recommend daily dexamethasone to see if I can improve her symptoms of nausea, weakness and postural dizziness We discussed side effects to be expected from dexamethasone I will see her prior to cycle 1 of treatment I plan to increase the dose of carfilzomib slowly, for cycle 1, we will start at 20 mg per metered square, for cycle to 27 mg per metered square and eventually cycle 3 and onwards at 36 mg per metered square We will continue myeloma panel once a month

## 2022-03-01 NOTE — Assessment & Plan Note (Signed)
We have discussions about goals of care She is willing to proceed She is aware that treatment goal is palliative

## 2022-03-01 NOTE — Progress Notes (Signed)
DISCONTINUE ON PATHWAY REGIMEN - Multiple Myeloma and Other Plasma Cell Dyscrasias     Cycles 1 through 3: A cycle is every 21 days:     Dexamethasone      Bortezomib      Daratumumab    Cycles 4 through 8: A cycle is every 21 days:     Dexamethasone      Bortezomib      Daratumumab    Cycles 9 and beyond: A cycle is every 28 days:     Daratumumab   **Always confirm dose/schedule in your pharmacy ordering system**  REASON: Disease Progression PRIOR TREATMENT: [id number]: DaraVd (Daratumumab 16 mg/kg + Bortezomib 1.3 mg/m2 SUBQ D1,4,8,11 + Dexamethasone 20 mg) Until Progression or Unacceptable Toxicity TREATMENT RESPONSE: Progressive Disease (PD)  START OFF PATHWAY REGIMEN - Multiple Myeloma and Other Plasma Cell Dyscrasias   [id number]:Carfilzomib 20/56 mg/m2 Monotherapy q28 Days:   A cycle is every 28 days:     Carfilzomib      Carfilzomib      Carfilzomib      Carfilzomib   **Always confirm dose/schedule in your pharmacy ordering system**  Patient Characteristics: Multiple Myeloma, Relapsed / Refractory, Second through Fourth Lines of Therapy, Frail or Not a Candidate for Triplet Therapy Disease Classification: Multiple Myeloma R-ISS Staging: III Therapeutic Status: Relapsed Line of Therapy: Fourth Line Intent of Therapy: Non-Curative / Palliative Intent, Discussed with Patient

## 2022-03-01 NOTE — Assessment & Plan Note (Signed)
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

## 2022-03-02 ENCOUNTER — Other Ambulatory Visit: Payer: Self-pay

## 2022-03-02 ENCOUNTER — Telehealth: Payer: Self-pay

## 2022-03-02 NOTE — Telephone Encounter (Signed)
Called and given appt for port placement at [facility] on 7/27, arrive in admitting at 7am, npo,  need driver, may take am meds with sips of water and 24 hour supervision needed. She and her husband verbalized understanding.

## 2022-03-03 ENCOUNTER — Telehealth: Payer: Self-pay | Admitting: Hematology and Oncology

## 2022-03-03 ENCOUNTER — Other Ambulatory Visit (HOSPITAL_COMMUNITY): Payer: Self-pay | Admitting: Physician Assistant

## 2022-03-03 ENCOUNTER — Other Ambulatory Visit: Payer: Self-pay | Admitting: Internal Medicine

## 2022-03-03 ENCOUNTER — Other Ambulatory Visit: Payer: Self-pay | Admitting: Radiology

## 2022-03-03 ENCOUNTER — Other Ambulatory Visit: Payer: Self-pay

## 2022-03-03 NOTE — H&P (Signed)
Chief Complaint: Patient was seen in consultation today for port-a-catheter placement   Referring Physician(s): Dupo  Supervising Physician: {Supervising Physician:[id number]}  Patient Status: [facility] - Out-pt  History of Present Illness: Jessica [name] is a 71 y.o. female with a medical history significant for a plasmacytoma s/p orbital craniectomy (2015) and multiple myeloma which was first diagnosed in 18. She has been treated with chemotherapy, radiation therapy and a stem cell transplant in 2016. She has been receiving maintenance therapies since that time and has had multiple relapses requiring a change in her treatment regimen. Recent lab work shows further disease progression and her oncology team is preparing her for a fourth line IV therapy.  Interventional Radiology has been asked to evaluate this patient for an image-guided port-a-catheter placement to facilitate her treatment plans.    Past Medical History:  Diagnosis Date   H/O stem cell transplant [facility]) 03/2015   [facility]   Multiple myeloma [facility]) 11/19/2014   Multiple myeloma ([place])    Multiple myeloma ([facility])    MVP (mitral valve prolapse)    req prophylaxis   Other constipation 12/03/2014   Right ovarian cyst 10/16/15   16 mm simple cyst - ultrasound yearly.   Upper respiratory infection, acute 08/12/2015    Past Surgical History:  Procedure Laterality Date   CATARACT EXTRACTION, BILATERAL  10/2010   Implants(ReSTOR) Bilat   LASER ABLATION OF CONDYLOMAS  1990   CIN 1 cervix   multiple myeloma surgery Right 08/2014   [facility]    Allergies: Augmentin [amoxicillin-pot clavulanate], Albuterol, Codeine, Ibuprofen, Nsaids, Tolmetin, and Tetanus-diphth-acell pertussis  Medications: Prior to Admission medications   Medication Sig Start Date End Date Taking? Authorizing Provider  acetaminophen (TYLENOL) 500 MG tablet Take 500-1,000 mg by mouth every 6 (six) hours as needed for mild pain, moderate pain,  fever or headache.   Yes [provider]  acyclovir (ZOVIRAX) 400 MG tablet Take 1 tablet (400 mg total) by mouth daily. 03/01/22  Yes Gorsuch, Ni, MD  dexamethasone (DECADRON) 4 MG tablet Take 1 tablet (4 mg total) by mouth daily. 03/01/22  Yes Gorsuch, Ni, MD  loratadine (CLARITIN) 10 MG tablet Take 10 mg by mouth daily as needed for allergies.   Yes [provider]  Multiple Vitamins-Minerals (CENTRUM SILVER PO) Take 1 tablet by mouth daily.    Yes [provider]  omeprazole (PRILOSEC) 20 MG capsule TAKE 1 CAPSULE(20 MG) BY MOUTH DAILY 09/15/21  Yes Gorsuch, Ni, MD  ondansetron (ZOFRAN) 8 MG tablet Take 1 tablet (8 mg total) by mouth 2 (two) times daily as needed (Nausea or vomiting). 03/01/22  Yes [name], MD  prochlorperazine (COMPAZINE) 10 MG tablet Take 1 tablet (10 mg total) by mouth every 6 (six) hours as needed (Nausea or vomiting). 03/01/22  Yes Gorsuch, Ni, MD  senna-docusate (SENOKOT-S) 8.6-50 MG tablet Take 1-2 tablets by mouth daily as needed for mild constipation or moderate constipation. 08/20/14  Yes [provider]  sodium chloride (OCEAN) 0.65 % SOLN nasal spray Place 1 spray into both nostrils as needed for congestion.   Yes [provider]  vitamin B-12 (CYANOCOBALAMIN) 500 MCG tablet Take 500 mcg by mouth daily.   Yes [provider]  acyclovir (ZOVIRAX) 400 MG tablet Take 1 tablet (400 mg total) by mouth 2 (two) times daily. Patient not taking: Reported on 03/02/2022 08/18/21   [name], MD  lidocaine-prilocaine (EMLA) cream Apply to affected area once 03/01/22   [name], MD  Family History  Problem Relation Age of Onset   Osteoporosis Mother    Diabetes Father     Social History   Socioeconomic History   Marital status: Married    Spouse name: Not on file   Number of children: Not on file   Years of education: Not on file   Highest education level: Not on file  Occupational History   Not on file   Tobacco Use   Smoking status: Never   Smokeless tobacco: Never  Vaping Use   Vaping Use: Never used  Substance and Sexual Activity   Alcohol use: No    Alcohol/week: 0.0 standard drinks of alcohol   Drug use: No   Sexual activity: Not Currently    Partners: Male    Birth control/protection: Post-menopausal    Comment: vasectomy  Other Topics Concern   Not on file  Social History Narrative   Not on file   Social Determinants of Health   Financial Resource Strain: Not on file  Food Insecurity: Not on file  Transportation Needs: Not on file  Physical Activity: Not on file  Stress: Not on file  Social Connections: Not on file    Review of Systems: A 12 point ROS discussed and pertinent positives are indicated in the HPI above.  All other systems are negative.  Review of Systems  Vital Signs: LMP 08/10/2003   Physical Exam  Imaging: No results found.  Labs:  CBC: Recent Labs    11/26/21 1110 12/24/21 1125 01/21/22 1033 02/19/22 0955  WBC 4.6 4.8 4.3 3.7*  HGB 10.2* 10.6* 10.3* 10.1*  HCT 31.9* 32.6* 31.4* 31.5*  PLT 299 234 174 183    COAGS: No results for input(s): "INR", "APTT" in the last 8760 hours.  BMP: Recent Labs    11/26/21 1110 12/24/21 1125 01/21/22 1033 02/19/22 0955  NA 142 142 140 139  K 3.6 3.1* 3.6 3.3*  CL 111 110 108 106  CO2 26 25 26 28   GLUCOSE 90 82 84 88  BUN 11 13 10 11   CALCIUM 9.2 9.1 9.6 9.4  CREATININE 0.91 1.10* 0.92 0.89  GFRNONAA >60 53* >60 >60    LIVER FUNCTION TESTS: Recent Labs    11/26/21 1110 12/24/21 1125 01/21/22 1033 02/19/22 0955  BILITOT 0.6 0.6 0.7 0.9  AST 14* 15 14* 11*  ALT 10 9 9 6   ALKPHOS 55 61 59 55  PROT 6.6 6.9 7.0 6.9  ALBUMIN 4.1 4.1 4.2 4.1    TUMOR MARKERS: No results for input(s): "AFPTM", "CEA", "CA199", "CHROMGRNA" in the last 8760 hours.  Assessment and Plan:  Multiple Myeloma; relapsed/refractory: Jessica [name], 71 year old female, presents today to the [facility] department for an image-guided port-a-catheter placement.  Risks and benefits of image-guided port-a-catheter placement were discussed with the patient including, but not limited to bleeding, infection, pneumothorax, or fibrin sheath development and need for additional procedures.  All of the patient's questions were answered, patient is agreeable to proceed. She has been NPO.   Consent signed and in chart.  Thank you for this interesting consult.  I greatly enjoyed meeting Jessica [name] and look forward to participating in their care.  A copy of this report was sent to the requesting provider on this date.  Electronically Signed: Soyla Dryer, AGACNP-BC [phone] 03/03/2022, 3:14 PM   I spent a total of  30 Minutes   in face to face in clinical consultation, greater than 50% of which was counseling/coordinating  care for image-guided port-a-catheter placement

## 2022-03-03 NOTE — Telephone Encounter (Signed)
Scheduled appointment per 7/24 los. Called to confirm 8/1 appointment with the patient. Patient is aware.

## 2022-03-04 ENCOUNTER — Ambulatory Visit (HOSPITAL_COMMUNITY)
Admission: RE | Admit: 2022-03-04 | Discharge: 2022-03-04 | Disposition: A | Discharge status: Home / Self Care | Payer: Medicare Other | Source: Ambulatory Visit | Attending: Hematology and Oncology | Admitting: Hematology and Oncology

## 2022-03-04 ENCOUNTER — Other Ambulatory Visit: Payer: Self-pay

## 2022-03-04 DIAGNOSIS — Z923 Personal history of irradiation: Secondary | ICD-10-CM | POA: Diagnosis not present

## 2022-03-04 DIAGNOSIS — C9002 Multiple myeloma in relapse: Secondary | ICD-10-CM | POA: Diagnosis present

## 2022-03-04 DIAGNOSIS — Z9221 Personal history of antineoplastic chemotherapy: Secondary | ICD-10-CM | POA: Insufficient documentation

## 2022-03-04 HISTORY — PX: IR IMAGING GUIDED PORT INSERTION: IMG5740

## 2022-03-04 MED ORDER — FENTANYL CITRATE (PF) 100 MCG/2ML IJ SOLN
INTRAMUSCULAR | Status: AC | PRN
  Administered 2022-03-04 (×2): 50 ug via INTRAVENOUS

## 2022-03-04 MED ORDER — MIDAZOLAM HCL 2 MG/2ML IJ SOLN
INTRAMUSCULAR | Status: AC | PRN
  Administered 2022-03-04 (×2): 1 mg via INTRAVENOUS

## 2022-03-04 MED ORDER — MIDAZOLAM HCL 2 MG/2ML IJ SOLN
INTRAMUSCULAR | Status: AC
  Filled 2022-03-04: qty 2

## 2022-03-04 MED ORDER — LIDOCAINE-EPINEPHRINE 1 %-1:100000 IJ SOLN
INTRAMUSCULAR | Status: AC | PRN
  Administered 2022-03-04: 20 mL

## 2022-03-04 MED ORDER — LIDOCAINE-EPINEPHRINE 1 %-1:100000 IJ SOLN
INTRAMUSCULAR | Status: AC
  Filled 2022-03-04: qty 1

## 2022-03-04 MED ORDER — HEPARIN SOD (PORK) LOCK FLUSH 100 UNIT/ML IV SOLN
INTRAVENOUS | Status: AC
  Filled 2022-03-04: qty 5

## 2022-03-04 MED ORDER — FENTANYL CITRATE (PF) 100 MCG/2ML IJ SOLN
INTRAMUSCULAR | Status: AC
  Filled 2022-03-04: qty 2

## 2022-03-04 MED ORDER — SODIUM CHLORIDE 0.9 % IV SOLN: INTRAVENOUS | Status: DC

## 2022-03-04 NOTE — Progress Notes (Signed)
Patient was given discharge instructions. Both verbalized understanding. 

## 2022-03-04 NOTE — Procedures (Signed)
Interventional Radiology Procedure Note  Procedure: Placement of a right IJ approach single lumen PowerPort.  Tip is positioned at the superior cavoatrial junction and catheter is ready for immediate use.  Complications: No immediate Recommendations:  - Ok to shower tomorrow - Do not submerge for 7 days - Routine line care   Signed,  Jinny Sweetland K. Nadiah Corbit, MD   

## 2022-03-08 ENCOUNTER — Other Ambulatory Visit: Payer: Self-pay

## 2022-03-08 ENCOUNTER — Telehealth: Payer: Self-pay

## 2022-03-08 MED FILL — Dexamethasone Sodium Phosphate Inj 100 MG/10ML: INTRAMUSCULAR | Qty: 2 | Status: AC

## 2022-03-08 NOTE — Telephone Encounter (Signed)
Returned her call. She is asking about her port that was placed last Thursday. She still has the dressing in place. Instructed to remove the dressing today and leave dermabond glue and steri-strips at the port site, do not remove and let them fall off. Told her tomorrow the nurse will apply a ice pack prior to placing needle in her port. Do not apply emla cream to port site for 2 weeks. She verbalized understanding and appreciated the call.

## 2022-03-09 ENCOUNTER — Inpatient Hospital Stay: Payer: Medicare Other

## 2022-03-09 ENCOUNTER — Other Ambulatory Visit: Payer: Self-pay

## 2022-03-09 ENCOUNTER — Inpatient Hospital Stay: Payer: Medicare Other | Attending: Hematology and Oncology

## 2022-03-09 ENCOUNTER — Inpatient Hospital Stay (HOSPITAL_BASED_OUTPATIENT_CLINIC_OR_DEPARTMENT_OTHER): Payer: Medicare Other | Admitting: Hematology and Oncology

## 2022-03-09 VITALS — BP 142/79 | HR 59 | Temp 98.2°F | Resp 18

## 2022-03-09 DIAGNOSIS — Z9481 Bone marrow transplant status: Secondary | ICD-10-CM

## 2022-03-09 DIAGNOSIS — Z7961 Long term (current) use of immunomodulator: Secondary | ICD-10-CM | POA: Diagnosis not present

## 2022-03-09 DIAGNOSIS — Z7189 Other specified counseling: Secondary | ICD-10-CM

## 2022-03-09 DIAGNOSIS — Z881 Allergy status to other antibiotic agents status: Secondary | ICD-10-CM | POA: Insufficient documentation

## 2022-03-09 DIAGNOSIS — Z880 Allergy status to penicillin: Secondary | ICD-10-CM | POA: Insufficient documentation

## 2022-03-09 DIAGNOSIS — N183 Chronic kidney disease, stage 3 unspecified: Secondary | ICD-10-CM | POA: Diagnosis not present

## 2022-03-09 DIAGNOSIS — R42 Dizziness and giddiness: Secondary | ICD-10-CM | POA: Diagnosis not present

## 2022-03-09 DIAGNOSIS — Z79899 Other long term (current) drug therapy: Secondary | ICD-10-CM | POA: Insufficient documentation

## 2022-03-09 DIAGNOSIS — Z7952 Long term (current) use of systemic steroids: Secondary | ICD-10-CM | POA: Insufficient documentation

## 2022-03-09 DIAGNOSIS — Z79624 Long term (current) use of inhibitors of nucleotide synthesis: Secondary | ICD-10-CM | POA: Diagnosis not present

## 2022-03-09 DIAGNOSIS — R7989 Other specified abnormal findings of blood chemistry: Secondary | ICD-10-CM | POA: Diagnosis not present

## 2022-03-09 DIAGNOSIS — Z887 Allergy status to serum and vaccine status: Secondary | ICD-10-CM | POA: Diagnosis not present

## 2022-03-09 DIAGNOSIS — Z9484 Stem cells transplant status: Secondary | ICD-10-CM | POA: Insufficient documentation

## 2022-03-09 DIAGNOSIS — Z5112 Encounter for antineoplastic immunotherapy: Secondary | ICD-10-CM | POA: Insufficient documentation

## 2022-03-09 DIAGNOSIS — Z886 Allergy status to analgesic agent status: Secondary | ICD-10-CM | POA: Insufficient documentation

## 2022-03-09 DIAGNOSIS — C9002 Multiple myeloma in relapse: Secondary | ICD-10-CM

## 2022-03-09 DIAGNOSIS — E538 Deficiency of other specified B group vitamins: Secondary | ICD-10-CM | POA: Insufficient documentation

## 2022-03-09 DIAGNOSIS — Z885 Allergy status to narcotic agent status: Secondary | ICD-10-CM | POA: Diagnosis not present

## 2022-03-09 DIAGNOSIS — D61818 Other pancytopenia: Secondary | ICD-10-CM | POA: Diagnosis not present

## 2022-03-09 DIAGNOSIS — Z7969 Long term (current) use of other immunomodulators and immunosuppressants: Secondary | ICD-10-CM | POA: Insufficient documentation

## 2022-03-09 LAB — CBC WITH DIFFERENTIAL (CANCER CENTER ONLY)
Abs Immature Granulocytes: 0.04 10*3/uL (ref 0.00–0.07)
Basophils Absolute: 0 10*3/uL (ref 0.0–0.1)
Basophils Relative: 0 %
Eosinophils Absolute: 0 10*3/uL (ref 0.0–0.5)
Eosinophils Relative: 0 %
HCT: 31.5 % — ABNORMAL LOW (ref 36.0–46.0)
Hemoglobin: 10.6 g/dL — ABNORMAL LOW (ref 12.0–15.0)
Immature Granulocytes: 1 %
Lymphocytes Relative: 17 %
Lymphs Abs: 1.3 10*3/uL (ref 0.7–4.0)
MCH: 29 pg (ref 26.0–34.0)
MCHC: 33.7 g/dL (ref 30.0–36.0)
MCV: 86.1 fL (ref 80.0–100.0)
Monocytes Absolute: 0.3 10*3/uL (ref 0.1–1.0)
Monocytes Relative: 4 %
Neutro Abs: 6.3 10*3/uL (ref 1.7–7.7)
Neutrophils Relative %: 78 %
Platelet Count: 292 10*3/uL (ref 150–400)
RBC: 3.66 MIL/uL — ABNORMAL LOW (ref 3.87–5.11)
RDW: 17.6 % — ABNORMAL HIGH (ref 11.5–15.5)
WBC Count: 8 10*3/uL (ref 4.0–10.5)
nRBC: 0 % (ref 0.0–0.2)

## 2022-03-09 LAB — CMP (CANCER CENTER ONLY)
ALT: 6 U/L (ref 0–44)
AST: 10 U/L — ABNORMAL LOW (ref 15–41)
Albumin: 4 g/dL (ref 3.5–5.0)
Alkaline Phosphatase: 49 U/L (ref 38–126)
Anion gap: 6 (ref 5–15)
BUN: 28 mg/dL — ABNORMAL HIGH (ref 8–23)
CO2: 23 mmol/L (ref 22–32)
Calcium: 8.9 mg/dL (ref 8.9–10.3)
Chloride: 107 mmol/L (ref 98–111)
Creatinine: 1.03 mg/dL — ABNORMAL HIGH (ref 0.44–1.00)
GFR, Estimated: 58 mL/min — ABNORMAL LOW (ref >60)
Glucose, Bld: 87 mg/dL (ref 70–99)
Potassium: 3.5 mmol/L (ref 3.5–5.1)
Sodium: 136 mmol/L (ref 135–145)
Total Bilirubin: 0.5 mg/dL (ref 0.3–1.2)
Total Protein: 6.6 g/dL (ref 6.5–8.1)

## 2022-03-09 MED ORDER — SODIUM CHLORIDE 0.9 % IV SOLN
10.0000 mg | Freq: Once | INTRAVENOUS | Status: AC
  Administered 2022-03-09: 10 mg via INTRAVENOUS
  Filled 2022-03-09: qty 10

## 2022-03-09 MED ORDER — SODIUM CHLORIDE 0.9 % IV SOLN: Freq: Once | INTRAVENOUS | Status: AC

## 2022-03-09 MED ORDER — SODIUM CHLORIDE 0.9% FLUSH
10.0000 mL | Freq: Once | INTRAVENOUS | Status: AC | PRN
  Administered 2022-03-09: 10 mL

## 2022-03-09 MED ORDER — DEXTROSE 5 % IV SOLN
20.5000 mg/m2 | Freq: Once | INTRAVENOUS | Status: AC
  Administered 2022-03-09: 30 mg via INTRAVENOUS
  Filled 2022-03-09: qty 15

## 2022-03-09 MED ORDER — SODIUM CHLORIDE 0.9% FLUSH
10.0000 mL | INTRAVENOUS | Status: DC | PRN
  Administered 2022-03-09: 10 mL

## 2022-03-09 MED ORDER — DEXAMETHASONE 4 MG PO TABS: 2.0000 mg | ORAL_TABLET | Freq: Every day | ORAL | 1 refills | Status: DC

## 2022-03-09 MED ORDER — HEPARIN SOD (PORK) LOCK FLUSH 100 UNIT/ML IV SOLN
500.0000 [IU] | Freq: Once | INTRAVENOUS | Status: AC | PRN
  Administered 2022-03-09: 500 [IU]

## 2022-03-09 NOTE — Patient Instructions (Addendum)
[facility] ONCOLOGY  Discharge Instructions: Thank you for choosing [facility] to provide your oncology and hematology care.   If you have a lab appointment with the [facility], please go directly to the [facility] and check in at the registration area.   Wear comfortable clothing and clothing appropriate for easy access to any Portacath or PICC line.   We strive to give you quality time with your provider. You may need to reschedule your appointment if you arrive late (15 or more minutes).  Arriving late affects you and other patients whose appointments are after yours.  Also, if you miss three or more appointments without notifying the office, you may be dismissed from the clinic at the provider's discretion.      For prescription refill requests, have your pharmacy contact our office and allow 72 hours for refills to be completed.    Today you received the following chemotherapy and/or immunotherapy agents Kyprolis (Carfilzomib)      To help prevent nausea and vomiting after your treatment, we encourage you to take your nausea medication as directed.  BELOW ARE SYMPTOMS THAT SHOULD BE REPORTED IMMEDIATELY: *FEVER GREATER THAN 100.4 F (38 C) OR HIGHER *CHILLS OR SWEATING *NAUSEA AND VOMITING THAT IS NOT CONTROLLED WITH YOUR NAUSEA MEDICATION *UNUSUAL SHORTNESS OF BREATH *UNUSUAL BRUISING OR BLEEDING *URINARY PROBLEMS (pain or burning when urinating, or frequent urination) *BOWEL PROBLEMS (unusual diarrhea, constipation, pain near the anus) TENDERNESS IN MOUTH AND THROAT WITH OR WITHOUT PRESENCE OF ULCERS (sore throat, sores in mouth, or a toothache) UNUSUAL RASH, SWELLING OR PAIN  UNUSUAL VAGINAL DISCHARGE OR ITCHING   Items with * indicate a potential emergency and should be followed up as soon as possible or go to the Emergency Department if any problems should occur.  Please show the CHEMOTHERAPY ALERT CARD or IMMUNOTHERAPY ALERT CARD at  check-in to the Emergency Department and triage nurse.  Should you have questions after your visit or need to cancel or reschedule your appointment, please contact [facility]  Dept: [phone]  and follow the prompts.  Office hours are 8:00 a.m. to 4:30 p.m. Monday - Friday. Please note that voicemails left after 4:00 p.m. may not be returned until the following business day.  We are closed weekends and major holidays. You have access to a nurse at all times for urgent questions. Please call the main number to the clinic Dept: [phone] and follow the prompts.   For any non-urgent questions, you may also contact your provider using MyChart. We now offer e-Visits for anyone 20 and older to request care online for non-urgent symptoms. For details visit [email or web address].   Also download the MyChart app! Go to the app store, search "MyChart", open the app, select [facility], and log in with your MyChart username and password.  Masks are optional in the cancer centers. If you would like for your care team to wear a mask while they are taking care of you, please let them know. You may have one support person who is at least 72 years old accompany you for your appointments. Carfilzomib injection What is this medication? CARFILZOMIB (kar FILZ oh mib) targets a specific protein within cancer cells and stops the cancer cells from growing. It is used to treat multiple myeloma. This medicine may be used for other purposes; ask your health care provider or pharmacist if you have questions. COMMON BRAND NAME(S): KYPROLIS What should I tell my care  team before I take this medication? They need to know if you have any of these conditions: heart disease history of blood clots irregular heartbeat kidney disease liver disease lung or breathing disease an unusual or allergic reaction to carfilzomib, or other medicines, foods, dyes, or preservatives pregnant or trying  to get pregnant breast-feeding How should I use this medication? This medicine is for injection or infusion into a vein. It is given by a health care professional in a hospital or clinic setting. Talk to your pediatrician regarding the use of this medicine in children. Special care may be needed. Overdosage: If you think you have taken too much of this medicine contact a poison control center or emergency room at once. NOTE: This medicine is only for you. Do not share this medicine with others. What if I miss a dose? It is important not to miss your dose. Call your doctor or health care professional if you are unable to keep an appointment. What may interact with this medication? Interactions are not expected. This list may not describe all possible interactions. Give your health care provider a list of all the medicines, herbs, non-prescription drugs, or dietary supplements you use. Also tell them if you smoke, drink alcohol, or use illegal drugs. Some items may interact with your medicine. What should I watch for while using this medication? Your condition will be monitored while you are receiving this medicine. You may need blood work done while you are taking this medicine. Do not become pregnant while taking this medicine or for 6 months after stopping it. Women should inform their health care provider if they wish to become pregnant or think they might be pregnant. Men should not father a child while taking this medicine and for 3 months after stopping it. There is a potential for serious side effects to an unborn child. Talk to your health care provider for more information. Do not breast-feed an infant while taking this medicine or for 2 weeks after stopping it. Check with your health care provider if you have severe diarrhea, nausea, and vomiting, or if you sweat a lot. The loss of too much body fluid may make it dangerous for you to take this medicine. You may get drowsy or dizzy. Do not  drive, use machinery, or do anything that needs mental alertness until you know how this medicine affects you. Do not stand up or sit up quickly, especially if you are an older patient. This reduces the risk of dizzy or fainting spells. What side effects may I notice from receiving this medication? Side effects that you should report to your doctor or health care professional as soon as possible: allergic reactions like skin rash, itching or hives, swelling of the face, lips, or tongue confusion dizziness feeling faint or lightheaded fever or chills palpitations seizures signs and symptoms of bleeding such as bloody or black, tarry stools; red or dark-brown urine; spitting up blood or brown material that looks like coffee grounds; red spots on the skin; unusual bruising or bleeding including from the eye, gums, or nose signs and symptoms of a blood clot such as breathing problems; changes in vision; chest pain; severe, sudden headache; pain, swelling, warmth in the leg; trouble speaking; sudden numbness or weakness of the face, arm or leg signs and symptoms of kidney injury like trouble passing urine or change in the amount of urine signs and symptoms of liver injury like dark yellow or brown urine; general ill feeling or flu-like symptoms; light-colored  stools; loss of appetite; nausea; right upper belly pain; unusually weak or tired; yellowing of the eyes or skin Side effects that usually do not require medical attention (report to your doctor or health care professional if they continue or are bothersome): back pain cough diarrhea headache muscle cramps trouble sleeping vomiting This list may not describe all possible side effects. Call your doctor for medical advice about side effects. You may report side effects to FDA at 1-800-FDA-1088. Where should I keep my medication? This drug is given in a hospital or clinic and will not be stored at home. NOTE: This sheet is a summary. It may not  cover all possible information. If you have questions about this medicine, talk to your doctor, pharmacist, or health care provider.  2023 Elsevier/Gold Standard (2020-09-04 00:00:00)

## 2022-03-10 ENCOUNTER — Encounter: Payer: Self-pay | Admitting: Hematology and Oncology

## 2022-03-10 NOTE — Assessment & Plan Note (Signed)
She will continue monthly B12 inj

## 2022-03-10 NOTE — Assessment & Plan Note (Signed)
This has improved with dexamethasone I plan gentle taper

## 2022-03-10 NOTE — Progress Notes (Signed)
[facility] OFFICE PROGRESS NOTE  Patient Care Team: Josetta Huddle, MD as PCP - General (Internal Medicine) Buford Dresser, MD as PCP - Cardiology (Cardiology) Ginette Pitman, MD as Consulting Physician (Hematology and Oncology) Hessie Dibble, MD as Consulting Physician (Hematology and Oncology)  ASSESSMENT & PLAN:  Multiple myeloma in relapse [facility]) She felt better with addition of daily dexamethasone We will start Kyprolis as scheduled and I plan to follow myeloma panel monthly I recommend reducing dose of dexamethasone to 2 mg She is reminded to take acyclovir, calcium and vitamin D   Vitamin B12 deficiency She will continue monthly B12 inj  Postural dizziness This has improved with dexamethasone I plan gentle taper  No orders of the defined types were placed in this encounter.   All questions were answered. The patient knows to call the clinic with any problems, questions or concerns. The total time spent in the appointment was 20 minutes encounter with patients including review of chart and various tests results, discussions about plan of care and coordination of care plan   [name], MD 03/10/2022 7:38 AM  INTERVAL HISTORY: Please see below for problem oriented charting. she returns for treatment follow-up with her husband She felt stronger and less dizzy with the start of steroids  REVIEW OF SYSTEMS:   Constitutional: Denies fevers, chills or abnormal weight loss Eyes: Denies blurriness of vision Ears, nose, mouth, throat, and face: Denies mucositis or sore throat Respiratory: Denies cough, dyspnea or wheezes Cardiovascular: Denies palpitation, chest discomfort or lower extremity swelling Gastrointestinal:  Denies nausea, heartburn or change in bowel habits Skin: Denies abnormal skin rashes Lymphatics: Denies new lymphadenopathy or easy bruising Neurological:Denies numbness, tingling or new weaknesses Behavioral/Psych: Mood is stable,  no new changes  All other systems were reviewed with the patient and are negative.  I have reviewed the past medical history, past surgical history, social history and family history with the patient and they are unchanged from previous note.  ALLERGIES:  is allergic to augmentin [amoxicillin-pot clavulanate], albuterol, codeine, ibuprofen, nsaids, tolmetin, and tetanus-diphth-acell pertussis.  MEDICATIONS:  Current Outpatient Medications  Medication Sig Dispense Refill   acetaminophen (TYLENOL) 500 MG tablet Take 500-1,000 mg by mouth every 6 (six) hours as needed for mild pain, moderate pain, fever or headache.     acyclovir (ZOVIRAX) 400 MG tablet Take 1 tablet (400 mg total) by mouth daily. 30 tablet 3   dexamethasone (DECADRON) 4 MG tablet Take 0.5 tablets (2 mg total) by mouth daily. 30 tablet 1   lidocaine-prilocaine (EMLA) cream Apply to affected area once 30 g 3   loratadine (CLARITIN) 10 MG tablet Take 10 mg by mouth daily as needed for allergies.     Multiple Vitamins-Minerals (CENTRUM SILVER PO) Take 1 tablet by mouth daily.      omeprazole (PRILOSEC) 20 MG capsule TAKE 1 CAPSULE(20 MG) BY MOUTH DAILY 90 capsule 11   ondansetron (ZOFRAN) 8 MG tablet Take 1 tablet (8 mg total) by mouth 2 (two) times daily as needed (Nausea or vomiting). 30 tablet 1   prochlorperazine (COMPAZINE) 10 MG tablet Take 1 tablet (10 mg total) by mouth every 6 (six) hours as needed (Nausea or vomiting). 30 tablet 1   senna-docusate (SENOKOT-S) 8.6-50 MG tablet Take 1-2 tablets by mouth daily as needed for mild constipation or moderate constipation.     sodium chloride (OCEAN) 0.65 % SOLN nasal spray Place 1 spray into both nostrils as needed for congestion.  vitamin B-12 (CYANOCOBALAMIN) 500 MCG tablet Take 500 mcg by mouth daily.     No current facility-administered medications for this visit.    SUMMARY OF ONCOLOGIC HISTORY: Oncology History Overview Note   M-protein 0.69 gm/dl IFIX - IgG, Kappa  IgG - 868 IgA - 19 IgM - < 20 Kappa - 21 Lambda - 5.7  09/06/2014 - Bone marrow aspirate and biopsy:   Normocellular marrow for age (40%) with a small monoclonal plasma cell population (1% on aspirate). Karyotype 36, XX  FISH Negative for myeloma associated changes  09/12/2014 - PET/CT  Two regions that are concerning for disease, one adjacent/involving the left ninth rib and one in the marrow of the right femur, in this patient with history of plasmacytoma.    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   04/01/2015 Bone Marrow Transplant   She received melphalan chemotherapy on 03/31/2015 followed by autologous stem cell transplant the day after   04/03/2015 - 04/18/2015 Hospital Admission   The patient was admitted to the hospital at [facility] for management related to complication from stem cell transplant. She had significant nausea requiring intravenous anti-emetics.   07/17/2015 -  Chemotherapy   She started maintenance Revlimid and monthly  zometa, then every 3 months   06/01/2018 Imaging   DEXA scan showed bone density T score in femur -2.3   04/20/2019 PET scan   1. No FDG avid osseous lesions or mass identified to suggest metabolically active lesion of myeloma or plasmacytoma. 2. Small nodular density within the paravertebral right lower lobe exhibits mild to moderate increased uptake within SUV max of 3.38. This is indeterminate. Review of CT chest from 10/05/2018 shows a corresponding Lung nodule in this area measuring the same. Small pulmonary neoplasm cannot be excluded. 3. Indeterminate, focal area of increased uptake is identified within the thoracic canal. Indeterminate favored to represent benign physiologic CNS activity.     04/30/2019 - 01/21/2022 Chemotherapy   Patient is on Treatment Plan : MYELOMA SQ Daratumumab Faspro q28d     04/30/2019 - 07/09/2019 Chemotherapy   The patient had bortezomib SQ (VELCADE) chemo injection 1.75 mg, 1.3 mg/m2 = 1.75 mg, Subcutaneous,  Once, 10 of 11 cycles Administration: 1.75 mg (04/30/2019), 1.75 mg (05/07/2019), 1.75 mg (05/14/2019), 1.75 mg (05/21/2019), 1.75 mg (05/28/2019), 1.75 mg (06/04/2019), 1.75 mg (06/11/2019), 1.75 mg (06/18/2019), 1.75 mg (06/25/2019), 1.75 mg (07/09/2019)  for chemotherapy treatment.    03/05/2022 Procedure   Successful placement  of a right IJ approach Power Port with ultrasound and fluoroscopic guidance. The catheter is ready for use   03/09/2022 -  Chemotherapy   Patient is on Treatment Plan : MYELOMA RELAPSED/REFRACTORY Carfilzomib + Dexamethasone (Kd) weekly q28d       PHYSICAL EXAMINATION: ECOG PERFORMANCE STATUS: 1 - Symptomatic but completely ambulatory  Vitals:   03/09/22 1237  BP: 138/75  Pulse: 62  Resp: 18  Temp: (!) 97.4 F (36.3 C)  SpO2: 100%   Filed Weights   03/09/22 1237  Weight: 103 lb 9.6 oz (47 kg)    GENERAL:alert, no distress and comfortable  NEURO: alert & oriented x 3 with fluent speech, no focal motor/sensory  deficits  LABORATORY DATA:  I have reviewed the data as listed    Component Value Date/Time   NA 136 03/09/2022 1204   NA 143 07/14/2017 1245   K 3.5 03/09/2022 1204   K 3.3 (L) 07/14/2017 1245   CL 107 03/09/2022 1204   CO2 23 03/09/2022 1204   CO2 20 (L) 07/14/2017 1245   GLUCOSE 87 03/09/2022 1204   GLUCOSE 106 07/14/2017 1245   BUN 28 (H) 03/09/2022 1204   BUN 9.3 07/14/2017 1245   CREATININE 1.03 (H) 03/09/2022 1204   CREATININE 0.8 07/14/2017 1245   CALCIUM 8.9 03/09/2022 1204   CALCIUM 8.4 07/14/2017 1245   PROT 6.6 03/09/2022 1204   PROT 6.0 07/14/2017 1245   PROT 6.2 (L) 07/14/2017 1245   ALBUMIN 4.0 03/09/2022 1204   ALBUMIN 3.6 07/14/2017 1245   AST 10 (L) 03/09/2022 1204   AST 21 07/14/2017 1245   ALT 6 03/09/2022 1204   ALT 21 07/14/2017 1245   ALKPHOS 49 03/09/2022 1204   ALKPHOS 78 07/14/2017 1245   BILITOT 0.5 03/09/2022 1204   BILITOT 0.43 07/14/2017 1245   GFRNONAA 58 (L) 03/09/2022 1204   GFRAA 57 (L) 04/21/2020 0817   GFRAA >60 01/14/2020 0828    No results found for: "SPEP", "UPEP"  Lab Results  Component Value Date   WBC 8.0 03/09/2022   NEUTROABS 6.3 03/09/2022   HGB 10.6 (L) 03/09/2022   HCT 31.5 (L) 03/09/2022   MCV 86.1 03/09/2022   PLT 292 03/09/2022      Chemistry      Component Value Date/Time   NA 136 03/09/2022 1204   NA 143 07/14/2017 1245   K 3.5 03/09/2022 1204   K 3.3 (L) 07/14/2017 1245   CL 107 03/09/2022 1204   CO2 23 03/09/2022 1204   CO2 20 (L) 07/14/2017 1245   BUN 28 (H) 03/09/2022 1204   BUN 9.3 07/14/2017 1245   CREATININE 1.03 (H) 03/09/2022 1204   CREATININE 0.8 07/14/2017 1245      Component Value Date/Time   CALCIUM 8.9 03/09/2022 1204   CALCIUM 8.4 07/14/2017 1245   ALKPHOS 49 03/09/2022 1204   ALKPHOS 78 07/14/2017 1245   AST 10 (L) 03/09/2022 1204   AST 21 07/14/2017 1245   ALT 6 03/09/2022 1204   ALT 21 07/14/2017 1245   BILITOT 0.5 03/09/2022 1204   BILITOT 0.43 07/14/2017 1245        RADIOGRAPHIC STUDIES: I have personally reviewed the radiological images as listed and agreed with the findings in the report. IR IMAGING GUIDED PORT INSERTION  Result Date: 03/04/2022 INDICATION: Multiple myeloma, not in remission. Patient requires durable venous access for chemotherapy. EXAM: IMPLANTED PORT A CATH PLACEMENT WITH ULTRASOUND AND FLUOROSCOPIC GUIDANCE MEDICATIONS: None. ANESTHESIA/SEDATION: Versed 2 mg IV; Fentanyl  100 mcg IV; Moderate Sedation Time:  17 minutes The patient's vital signs and level of consciousness were continuously monitored during the procedure by the interventional radiology nurse under my direct supervision. FLUOROSCOPY: Radiation exposure index: Less than 1 mGy reference air kerma COMPLICATIONS: None immediate. PROCEDURE: The right neck and chest was prepped with chlorhexidine, and draped in the usual sterile fashion using maximum barrier technique (cap and mask, sterile gown, sterile gloves, large sterile sheet, hand hygiene and cutaneous antiseptic). Local anesthesia was attained by infiltration with 1% lidocaine with epinephrine. Ultrasound demonstrated patency of the right internal jugular vein, and this was documented with an image. Under real-time ultrasound guidance, this vein was accessed with a 21 gauge micropuncture needle and image documentation was performed. A small dermatotomy was made at the access site with an 11 scalpel. A 0.018" wire was advanced into the SVC and the access needle exchanged for a 19F micropuncture vascular sheath. The 0.018" wire was then removed and a 0.035" wire advanced into the IVC. An appropriate location for the subcutaneous reservoir was selected below the clavicle and an incision was made through the skin and underlying soft tissues. The subcutaneous tissues were then dissected using a combination of blunt and sharp surgical technique and a pocket was formed. A single lumen power injectable portacatheter was then tunneled  through the subcutaneous tissues from the pocket to the dermatotomy and the port reservoir placed within the subcutaneous pocket. The venous access site was then serially dilated and a peel away vascular sheath placed over the wire. The wire was removed and the port catheter advanced into position under fluoroscopic guidance. The catheter tip is positioned in the superior cavoatrial junction. This was documented with a spot image. The portacatheter was then tested and found to flush and aspirate well. The port was flushed with saline followed by 100 units/mL heparinized saline. The pocket was then closed in two layers using first subdermal inverted interrupted absorbable sutures followed by a running subcuticular suture. The epidermis was then sealed with Dermabond. The dermatotomy at the venous access site was also closed with Dermabond. IMPRESSION: Successful placement of a right IJ approach Power Port with ultrasound and fluoroscopic guidance. The catheter is ready for use. Electronically Signed   By: Jacqulynn Cadet M.D.   On: 03/04/2022 18:14

## 2022-03-10 NOTE — Assessment & Plan Note (Signed)
She felt better with addition of daily dexamethasone We will start Kyprolis as scheduled and I plan to follow myeloma panel monthly I recommend reducing dose of dexamethasone to 2 mg She is reminded to take acyclovir, calcium and vitamin D

## 2022-03-15 MED FILL — Dexamethasone Sodium Phosphate Inj 100 MG/10ML: INTRAMUSCULAR | Qty: 1 | Status: AC

## 2022-03-16 ENCOUNTER — Inpatient Hospital Stay: Payer: Medicare Other

## 2022-03-16 VITALS — BP 138/81 | HR 61 | Temp 98.1°F | Resp 18 | Wt 105.8 lb

## 2022-03-16 DIAGNOSIS — C9002 Multiple myeloma in relapse: Secondary | ICD-10-CM

## 2022-03-16 DIAGNOSIS — Z9481 Bone marrow transplant status: Secondary | ICD-10-CM

## 2022-03-16 DIAGNOSIS — Z7189 Other specified counseling: Secondary | ICD-10-CM

## 2022-03-16 DIAGNOSIS — Z5112 Encounter for antineoplastic immunotherapy: Secondary | ICD-10-CM | POA: Diagnosis not present

## 2022-03-16 LAB — CMP (CANCER CENTER ONLY)
ALT: 7 U/L (ref 0–44)
AST: 9 U/L — ABNORMAL LOW (ref 15–41)
Albumin: 3.8 g/dL (ref 3.5–5.0)
Alkaline Phosphatase: 58 U/L (ref 38–126)
Anion gap: 5 (ref 5–15)
BUN: 25 mg/dL — ABNORMAL HIGH (ref 8–23)
CO2: 23 mmol/L (ref 22–32)
Calcium: 8.2 mg/dL — ABNORMAL LOW (ref 8.9–10.3)
Chloride: 107 mmol/L (ref 98–111)
Creatinine: 1.07 mg/dL — ABNORMAL HIGH (ref 0.44–1.00)
GFR, Estimated: 55 mL/min — ABNORMAL LOW (ref >60)
Glucose, Bld: 99 mg/dL (ref 70–99)
Potassium: 3.4 mmol/L — ABNORMAL LOW (ref 3.5–5.1)
Sodium: 135 mmol/L (ref 135–145)
Total Bilirubin: 0.5 mg/dL (ref 0.3–1.2)
Total Protein: 6.4 g/dL — ABNORMAL LOW (ref 6.5–8.1)

## 2022-03-16 LAB — CBC WITH DIFFERENTIAL (CANCER CENTER ONLY)
Abs Immature Granulocytes: 0.09 10*3/uL — ABNORMAL HIGH (ref 0.00–0.07)
Basophils Absolute: 0 10*3/uL (ref 0.0–0.1)
Basophils Relative: 0 %
Eosinophils Absolute: 0.3 10*3/uL (ref 0.0–0.5)
Eosinophils Relative: 2 %
HCT: 31.5 % — ABNORMAL LOW (ref 36.0–46.0)
Hemoglobin: 10.7 g/dL — ABNORMAL LOW (ref 12.0–15.0)
Immature Granulocytes: 1 %
Lymphocytes Relative: 12 %
Lymphs Abs: 1.5 10*3/uL (ref 0.7–4.0)
MCH: 29.7 pg (ref 26.0–34.0)
MCHC: 34 g/dL (ref 30.0–36.0)
MCV: 87.5 fL (ref 80.0–100.0)
Monocytes Absolute: 0.3 10*3/uL (ref 0.1–1.0)
Monocytes Relative: 3 %
Neutro Abs: 10 10*3/uL — ABNORMAL HIGH (ref 1.7–7.7)
Neutrophils Relative %: 82 %
Platelet Count: 204 10*3/uL (ref 150–400)
RBC: 3.6 MIL/uL — ABNORMAL LOW (ref 3.87–5.11)
RDW: 18.3 % — ABNORMAL HIGH (ref 11.5–15.5)
WBC Count: 12.2 10*3/uL — ABNORMAL HIGH (ref 4.0–10.5)
nRBC: 0 % (ref 0.0–0.2)

## 2022-03-16 MED ORDER — SODIUM CHLORIDE 0.9% FLUSH
10.0000 mL | Freq: Once | INTRAVENOUS | Status: AC
  Administered 2022-03-16: 10 mL

## 2022-03-16 MED ORDER — SODIUM CHLORIDE 0.9 % IV SOLN: Freq: Once | INTRAVENOUS | Status: AC

## 2022-03-16 MED ORDER — SODIUM CHLORIDE 0.9% FLUSH
10.0000 mL | INTRAVENOUS | Status: DC | PRN
  Administered 2022-03-16: 10 mL

## 2022-03-16 MED ORDER — SODIUM CHLORIDE 0.9 % IV SOLN
10.0000 mg | Freq: Once | INTRAVENOUS | Status: AC
  Administered 2022-03-16: 10 mg via INTRAVENOUS
  Filled 2022-03-16: qty 10

## 2022-03-16 MED ORDER — CYANOCOBALAMIN 1000 MCG/ML IJ SOLN
1000.0000 ug | Freq: Once | INTRAMUSCULAR | Status: AC
  Administered 2022-03-16: 1000 ug via INTRAMUSCULAR
  Filled 2022-03-16: qty 1

## 2022-03-16 MED ORDER — HEPARIN SOD (PORK) LOCK FLUSH 100 UNIT/ML IV SOLN
500.0000 [IU] | Freq: Once | INTRAVENOUS | Status: AC | PRN
  Administered 2022-03-16: 500 [IU]

## 2022-03-16 MED ORDER — DEXTROSE 5 % IV SOLN
20.5000 mg/m2 | Freq: Once | INTRAVENOUS | Status: AC
  Administered 2022-03-16: 30 mg via INTRAVENOUS
  Filled 2022-03-16: qty 15

## 2022-03-16 NOTE — Patient Instructions (Signed)
[facility]  Discharge Instructions: Thank you for choosing [facility] to provide your oncology and hematology care.   If you have a lab appointment with the [facility], please go directly to the [facility] and check in at the registration area.   Wear comfortable clothing and clothing appropriate for easy access to any Portacath or PICC line.   We strive to give you quality time with your provider. You may need to reschedule your appointment if you arrive late (15 or more minutes).  Arriving late affects you and other patients whose appointments are after yours.  Also, if you miss three or more appointments without notifying the office, you may be dismissed from the clinic at the provider's discretion.      For prescription refill requests, have your pharmacy contact our office and allow 72 hours for refills to be completed.    Today you received the following chemotherapy and/or immunotherapy agents Kyprolis (Carfilzomib)      To help prevent nausea and vomiting after your treatment, we encourage you to take your nausea medication as directed.  BELOW ARE SYMPTOMS THAT SHOULD BE REPORTED IMMEDIATELY: *FEVER GREATER THAN 100.4 F (38 C) OR HIGHER *CHILLS OR SWEATING *NAUSEA AND VOMITING THAT IS NOT CONTROLLED WITH YOUR NAUSEA MEDICATION *UNUSUAL SHORTNESS OF BREATH *UNUSUAL BRUISING OR BLEEDING *URINARY PROBLEMS (pain or burning when urinating, or frequent urination) *BOWEL PROBLEMS (unusual diarrhea, constipation, pain near the anus) TENDERNESS IN MOUTH AND THROAT WITH OR WITHOUT PRESENCE OF ULCERS (sore throat, sores in mouth, or a toothache) UNUSUAL RASH, SWELLING OR PAIN  UNUSUAL VAGINAL DISCHARGE OR ITCHING   Items with * indicate a potential emergency and should be followed up as soon as possible or go to the Emergency Department if any problems should occur.  Please show the CHEMOTHERAPY ALERT CARD or IMMUNOTHERAPY ALERT CARD at  check-in to the Emergency Department and triage nurse.  Should you have questions after your visit or need to cancel or reschedule your appointment, please contact [facility]  Dept: [phone]  and follow the prompts.  Office hours are 8:00 a.m. to 4:30 p.m. Monday - Friday. Please note that voicemails left after 4:00 p.m. may not be returned until the following business day.  We are closed weekends and major holidays. You have access to a nurse at all times for urgent questions. Please call the main number to the clinic Dept: [phone] and follow the prompts.   For any non-urgent questions, you may also contact your provider using MyChart. We now offer e-Visits for anyone 42 and older to request care online for non-urgent symptoms. For details visit [email or web address].   Also download the MyChart app! Go to the app store, search "MyChart", open the app, select [facility], and log in with your MyChart username and password.  Masks are optional in the cancer centers. If you would like for your care team to wear a mask while they are taking care of you, please let them know. You may have one support person who is at least 72 years old accompany you for your appointments. Carfilzomib injection What is this medication? CARFILZOMIB (kar FILZ oh mib) targets a specific protein within cancer cells and stops the cancer cells from growing. It is used to treat multiple myeloma. This medicine may be used for other purposes; ask your health care provider or pharmacist if you have questions. COMMON BRAND NAME(S): KYPROLIS What should I tell my care  team before I take this medication? They need to know if you have any of these conditions: heart disease history of blood clots irregular heartbeat kidney disease liver disease lung or breathing disease an unusual or allergic reaction to carfilzomib, or other medicines, foods, dyes, or preservatives pregnant or trying  to get pregnant breast-feeding How should I use this medication? This medicine is for injection or infusion into a vein. It is given by a health care professional in a hospital or clinic setting. Talk to your pediatrician regarding the use of this medicine in children. Special care may be needed. Overdosage: If you think you have taken too much of this medicine contact a poison control center or emergency room at once. NOTE: This medicine is only for you. Do not share this medicine with others. What if I miss a dose? It is important not to miss your dose. Call your doctor or health care professional if you are unable to keep an appointment. What may interact with this medication? Interactions are not expected. This list may not describe all possible interactions. Give your health care provider a list of all the medicines, herbs, non-prescription drugs, or dietary supplements you use. Also tell them if you smoke, drink alcohol, or use illegal drugs. Some items may interact with your medicine. What should I watch for while using this medication? Your condition will be monitored while you are receiving this medicine. You may need blood work done while you are taking this medicine. Do not become pregnant while taking this medicine or for 6 months after stopping it. Women should inform their health care provider if they wish to become pregnant or think they might be pregnant. Men should not father a child while taking this medicine and for 3 months after stopping it. There is a potential for serious side effects to an unborn child. Talk to your health care provider for more information. Do not breast-feed an infant while taking this medicine or for 2 weeks after stopping it. Check with your health care provider if you have severe diarrhea, nausea, and vomiting, or if you sweat a lot. The loss of too much body fluid may make it dangerous for you to take this medicine. You may get drowsy or dizzy. Do not  drive, use machinery, or do anything that needs mental alertness until you know how this medicine affects you. Do not stand up or sit up quickly, especially if you are an older patient. This reduces the risk of dizzy or fainting spells. What side effects may I notice from receiving this medication? Side effects that you should report to your doctor or health care professional as soon as possible: allergic reactions like skin rash, itching or hives, swelling of the face, lips, or tongue confusion dizziness feeling faint or lightheaded fever or chills palpitations seizures signs and symptoms of bleeding such as bloody or black, tarry stools; red or dark-brown urine; spitting up blood or brown material that looks like coffee grounds; red spots on the skin; unusual bruising or bleeding including from the eye, gums, or nose signs and symptoms of a blood clot such as breathing problems; changes in vision; chest pain; severe, sudden headache; pain, swelling, warmth in the leg; trouble speaking; sudden numbness or weakness of the face, arm or leg signs and symptoms of kidney injury like trouble passing urine or change in the amount of urine signs and symptoms of liver injury like dark yellow or brown urine; general ill feeling or flu-like symptoms; light-colored  stools; loss of appetite; nausea; right upper belly pain; unusually weak or tired; yellowing of the eyes or skin Side effects that usually do not require medical attention (report to your doctor or health care professional if they continue or are bothersome): back pain cough diarrhea headache muscle cramps trouble sleeping vomiting This list may not describe all possible side effects. Call your doctor for medical advice about side effects. You may report side effects to FDA at 1-800-FDA-1088. Where should I keep my medication? This drug is given in a hospital or clinic and will not be stored at home. NOTE: This sheet is a summary. It may not  cover all possible information. If you have questions about this medicine, talk to your doctor, pharmacist, or health care provider.  2023 Elsevier/Gold Standard (2020-09-04 00:00:00)

## 2022-03-18 ENCOUNTER — Encounter: Payer: Self-pay | Admitting: Hematology and Oncology

## 2022-03-22 ENCOUNTER — Other Ambulatory Visit: Payer: Self-pay

## 2022-03-22 ENCOUNTER — Telehealth: Payer: Self-pay

## 2022-03-22 MED FILL — Dexamethasone Sodium Phosphate Inj 100 MG/10ML: INTRAMUSCULAR | Qty: 1 | Status: AC

## 2022-03-22 NOTE — Telephone Encounter (Signed)
Returned her call. Will forward a message regarding her concern.

## 2022-03-23 ENCOUNTER — Inpatient Hospital Stay: Payer: Medicare Other

## 2022-03-23 ENCOUNTER — Other Ambulatory Visit: Payer: Self-pay

## 2022-03-23 VITALS — BP 147/83 | HR 67 | Temp 98.4°F | Resp 16 | Ht 60.0 in | Wt 106.8 lb

## 2022-03-23 DIAGNOSIS — Z9481 Bone marrow transplant status: Secondary | ICD-10-CM

## 2022-03-23 DIAGNOSIS — Z5112 Encounter for antineoplastic immunotherapy: Secondary | ICD-10-CM | POA: Diagnosis not present

## 2022-03-23 DIAGNOSIS — C9002 Multiple myeloma in relapse: Secondary | ICD-10-CM

## 2022-03-23 DIAGNOSIS — Z7189 Other specified counseling: Secondary | ICD-10-CM

## 2022-03-23 LAB — CBC WITH DIFFERENTIAL (CANCER CENTER ONLY)
Abs Immature Granulocytes: 0.08 10*3/uL — ABNORMAL HIGH (ref 0.00–0.07)
Basophils Absolute: 0 10*3/uL (ref 0.0–0.1)
Basophils Relative: 0 %
Eosinophils Absolute: 0.2 10*3/uL (ref 0.0–0.5)
Eosinophils Relative: 3 %
HCT: 29.9 % — ABNORMAL LOW (ref 36.0–46.0)
Hemoglobin: 10.1 g/dL — ABNORMAL LOW (ref 12.0–15.0)
Immature Granulocytes: 1 %
Lymphocytes Relative: 18 %
Lymphs Abs: 1.5 10*3/uL (ref 0.7–4.0)
MCH: 29.8 pg (ref 26.0–34.0)
MCHC: 33.8 g/dL (ref 30.0–36.0)
MCV: 88.2 fL (ref 80.0–100.0)
Monocytes Absolute: 0.4 10*3/uL (ref 0.1–1.0)
Monocytes Relative: 4 %
Neutro Abs: 6.6 10*3/uL (ref 1.7–7.7)
Neutrophils Relative %: 74 %
Platelet Count: 180 10*3/uL (ref 150–400)
RBC: 3.39 MIL/uL — ABNORMAL LOW (ref 3.87–5.11)
RDW: 18.7 % — ABNORMAL HIGH (ref 11.5–15.5)
WBC Count: 8.8 10*3/uL (ref 4.0–10.5)
nRBC: 0 % (ref 0.0–0.2)

## 2022-03-23 LAB — CMP (CANCER CENTER ONLY)
ALT: 11 U/L (ref 0–44)
AST: 14 U/L — ABNORMAL LOW (ref 15–41)
Albumin: 3.5 g/dL (ref 3.5–5.0)
Alkaline Phosphatase: 57 U/L (ref 38–126)
Anion gap: 9 (ref 5–15)
BUN: 21 mg/dL (ref 8–23)
CO2: 20 mmol/L — ABNORMAL LOW (ref 22–32)
Calcium: 8.5 mg/dL — ABNORMAL LOW (ref 8.9–10.3)
Chloride: 108 mmol/L (ref 98–111)
Creatinine: 1.11 mg/dL — ABNORMAL HIGH (ref 0.44–1.00)
GFR, Estimated: 53 mL/min — ABNORMAL LOW (ref >60)
Glucose, Bld: 91 mg/dL (ref 70–99)
Potassium: 3.2 mmol/L — ABNORMAL LOW (ref 3.5–5.1)
Sodium: 137 mmol/L (ref 135–145)
Total Bilirubin: 0.5 mg/dL (ref 0.3–1.2)
Total Protein: 6.2 g/dL — ABNORMAL LOW (ref 6.5–8.1)

## 2022-03-23 MED ORDER — SODIUM CHLORIDE 0.9 % IV SOLN
10.0000 mg | Freq: Once | INTRAVENOUS | Status: AC
  Administered 2022-03-23: 10 mg via INTRAVENOUS
  Filled 2022-03-23: qty 10

## 2022-03-23 MED ORDER — SODIUM CHLORIDE 0.9% FLUSH
10.0000 mL | Freq: Once | INTRAVENOUS | Status: AC
  Administered 2022-03-23: 10 mL

## 2022-03-23 MED ORDER — HEPARIN SOD (PORK) LOCK FLUSH 100 UNIT/ML IV SOLN
500.0000 [IU] | Freq: Once | INTRAVENOUS | Status: AC | PRN
  Administered 2022-03-23: 500 [IU]

## 2022-03-23 MED ORDER — DEXTROSE 5 % IV SOLN
20.5000 mg/m2 | Freq: Once | INTRAVENOUS | Status: AC
  Administered 2022-03-23: 30 mg via INTRAVENOUS
  Filled 2022-03-23: qty 15

## 2022-03-23 MED ORDER — SODIUM CHLORIDE 0.9% FLUSH
10.0000 mL | INTRAVENOUS | Status: DC | PRN
  Administered 2022-03-23: 10 mL

## 2022-03-23 MED ORDER — SODIUM CHLORIDE 0.9 % IV SOLN: Freq: Once | INTRAVENOUS | Status: AC

## 2022-03-23 NOTE — Patient Instructions (Signed)
[facility]  Discharge Instructions: Thank you for choosing [facility] to provide your oncology and hematology care.   If you have a lab appointment with the [facility], please go directly to the [facility] and check in at the registration area.   Wear comfortable clothing and clothing appropriate for easy access to any Portacath or PICC line.   We strive to give you quality time with your provider. You may need to reschedule your appointment if you arrive late (15 or more minutes).  Arriving late affects you and other patients whose appointments are after yours.  Also, if you miss three or more appointments without notifying the office, you may be dismissed from the clinic at the provider's discretion.      For prescription refill requests, have your pharmacy contact our office and allow 72 hours for refills to be completed.    Today you received the following chemotherapy and/or immunotherapy agent: Carfilzomib (Kyprolis)   To help prevent nausea and vomiting after your treatment, we encourage you to take your nausea medication as directed.  BELOW ARE SYMPTOMS THAT SHOULD BE REPORTED IMMEDIATELY: *FEVER GREATER THAN 100.4 F (38 C) OR HIGHER *CHILLS OR SWEATING *NAUSEA AND VOMITING THAT IS NOT CONTROLLED WITH YOUR NAUSEA MEDICATION *UNUSUAL SHORTNESS OF BREATH *UNUSUAL BRUISING OR BLEEDING *URINARY PROBLEMS (pain or burning when urinating, or frequent urination) *BOWEL PROBLEMS (unusual diarrhea, constipation, pain near the anus) TENDERNESS IN MOUTH AND THROAT WITH OR WITHOUT PRESENCE OF ULCERS (sore throat, sores in mouth, or a toothache) UNUSUAL RASH, SWELLING OR PAIN  UNUSUAL VAGINAL DISCHARGE OR ITCHING   Items with * indicate a potential emergency and should be followed up as soon as possible or go to the Emergency Department if any problems should occur.  Please show the CHEMOTHERAPY ALERT CARD or IMMUNOTHERAPY ALERT CARD at  check-in to the Emergency Department and triage nurse.  Should you have questions after your visit or need to cancel or reschedule your appointment, please contact [facility]  Dept: [phone]  and follow the prompts.  Office hours are 8:00 a.m. to 4:30 p.m. Monday - Friday. Please note that voicemails left after 4:00 p.m. may not be returned until the following business day.  We are closed weekends and major holidays. You have access to a nurse at all times for urgent questions. Please call the main number to the clinic Dept: [phone] and follow the prompts.   For any non-urgent questions, you may also contact your provider using MyChart. We now offer e-Visits for anyone 10 and older to request care online for non-urgent symptoms. For details visit [email or web address].   Also download the MyChart app! Go to the app store, search "MyChart", open the app, select [facility], and log in with your MyChart username and password.  Masks are optional in the cancer centers. If you would like for your care team to wear a mask while they are taking care of you, please let them know. You may have one support person who is at least 72 years old accompany you for your appointments. Carfilzomib Injection What is this medication? CARFILZOMIB (kar FILZ oh mib) treats multiple myeloma, a type of bone marrow cancer. It works by blocking a protein that causes cancer cells to grow and multiply. This helps to slow or stop the spread of cancer cells. This medicine may be used for other purposes; ask your health care provider or pharmacist if you have questions.  COMMON BRAND NAME(S): KYPROLIS What should I tell my care team before I take this medication? They need to know if you have any of these conditions: Heart disease History of blood clots Irregular heartbeat Kidney disease Liver disease Lung or breathing disease An unusual or allergic reaction to carfilzomib, or other  medications, foods, dyes, or preservatives If you or your partner are pregnant or trying to get pregnant Breastfeeding How should I use this medication? This medication is injected into a vein. It is given by your care team in a hospital or clinic setting. Talk to your care team about the use of this medication in children. Special care may be needed. Overdosage: If you think you have taken too much of this medicine contact a poison control center or emergency room at once. NOTE: This medicine is only for you. Do not share this medicine with others. What if I miss a dose? Keep appointments for follow-up doses. It is important not to miss your dose. Call your care team if you are unable to keep an appointment. What may interact with this medication? Interactions are not expected. This list may not describe all possible interactions. Give your health care provider a list of all the medicines, herbs, non-prescription drugs, or dietary supplements you use. Also tell them if you smoke, drink alcohol, or use illegal drugs. Some items may interact with your medicine. What should I watch for while using this medication? Your condition will be monitored carefully while you are receiving this medication. You may need blood work while taking this medication. Check with your care team if you have severe diarrhea, nausea, and vomiting, or if you sweat a lot. The loss of too much body fluid may make it dangerous for you to take this medication. This medication may affect your coordination, reaction time, or judgment. Do not drive or operate machinery until you know how this medication affects you. Sit up or stand slowly to reduce the risk of dizzy or fainting spells. Drinking alcohol with this medication can increase the risk of these side effects. Talk to your care team if you may be pregnant. Serious birth defects can occur if you take this medication during pregnancy and for 6 months after the last dose. You  will need a negative pregnancy test before starting this medication. Contraception is recommended while taking this medication and for 6 months after the last dose. Your care team can help you find an option that works for you. If your partner can get pregnant, use a condom during sex while taking this medication and for 3 months after the last dose. Do not breastfeed while taking this medication and for 2 weeks after the last dose. This medication may cause infertility. Talk to your care team if you are concerned about your fertility. What side effects may I notice from receiving this medication? Side effects that you should report to your care team as soon as possible: Allergic reactions--skin rash, itching, hives, swelling of the face, lips, tongue, or throat Bleeding--bloody or black, tar-like stools, vomiting blood or brown material that looks like coffee grounds, red or dark brown urine, small red or purple spots on skin, unusual bruising or bleeding Blood clot--pain, swelling, or warmth in the leg, shortness of breath, chest pain Dizziness, loss of balance or coordination, confusion or trouble speaking Heart attack--pain or tightness in the chest, shoulders, arms, or jaw, nausea, shortness of breath, cold or clammy skin, feeling faint or lightheaded Heart failure--shortness of breath, swelling of  the ankles, feet, or hands, sudden weight gain, unusual weakness or fatigue Heart rhythm changes--fast or irregular heartbeat, dizziness, feeling faint or lightheaded, chest pain, trouble breathing Increase in blood pressure Infection--fever, chills, cough, sore throat, wounds that don't heal, pain or trouble when passing urine, general feeling of discomfort or being unwell Infusion reactions--chest pain, shortness of breath or trouble breathing, feeling faint or lightheaded Kidney injury--decrease in the amount of urine, swelling of the ankles, hands, or feet Liver injury--right upper belly pain,  loss of appetite, nausea, light-colored stool, dark yellow or brown urine, yellowing skin or eyes, unusual weakness or fatigue Lung injury--shortness of breath or trouble breathing, cough, spitting up blood, chest pain, fever Pulmonary hypertension--shortness of breath, chest pain, fast or irregular heartbeat, feeling faint or lightheaded, fatigue, swelling of the ankles or feet Stomach pain, bloody diarrhea, pale skin, unusual weakness or fatigue, decrease in the amount of urine, which may be signs of hemolytic uremic syndrome Sudden and severe headache, confusion, change in vision, seizures, which may be signs of posterior reversible encephalopathy syndrome (PRES) TTP--purple spots on the skin or inside the mouth, pale skin, yellowing skin or eyes, unusual weakness or fatigue, fever, fast or irregular heartbeat, confusion, change in vision, trouble speaking, trouble walking Tumor lysis syndrome (TLS)--nausea, vomiting, diarrhea, decrease in the amount of urine, dark urine, unusual weakness or fatigue, confusion, muscle pain or cramps, fast or irregular heartbeat, joint pain Side effects that usually do not require medical attention (report to your care team if they continue or are bothersome): Diarrhea Fatigue Nausea Trouble sleeping This list may not describe all possible side effects. Call your doctor for medical advice about side effects. You may report side effects to FDA at 1-800-FDA-1088. Where should I keep my medication? This medication is given in a hospital or clinic. It will not be stored at home. NOTE: This sheet is a summary. It may not cover all possible information. If you have questions about this medicine, talk to your doctor, pharmacist, or health care provider.  2023 Elsevier/Gold Standard (2021-12-23 00:00:00)  

## 2022-03-24 LAB — KAPPA/LAMBDA LIGHT CHAINS
Kappa free light chain: 59.3 mg/L — ABNORMAL HIGH (ref 3.3–19.4)
Kappa, lambda light chain ratio: >39.53 — ABNORMAL HIGH (ref 0.26–1.65)
Lambda free light chains: <1.5 mg/L — ABNORMAL LOW (ref 5.7–26.3)

## 2022-03-26 ENCOUNTER — Telehealth: Payer: Self-pay

## 2022-03-26 LAB — MULTIPLE MYELOMA PANEL, SERUM
Albumin SerPl Elph-Mcnc: 3.4 g/dL (ref 2.9–4.4)
Albumin/Glob SerPl: 1.4 (ref 0.7–1.7)
Alpha 1: 0.2 g/dL (ref 0.0–0.4)
Alpha2 Glob SerPl Elph-Mcnc: 0.7 g/dL (ref 0.4–1.0)
B-Globulin SerPl Elph-Mcnc: 0.7 g/dL (ref 0.7–1.3)
Gamma Glob SerPl Elph-Mcnc: 0.8 g/dL (ref 0.4–1.8)
Globulin, Total: 2.5 g/dL (ref 2.2–3.9)
IgA: 11 mg/dL — ABNORMAL LOW (ref 64–422)
IgG (Immunoglobin G), Serum: 888 mg/dL (ref 586–1602)
IgM (Immunoglobulin M), Srm: <5 mg/dL — ABNORMAL LOW (ref 26–217)
M Protein SerPl Elph-Mcnc: 0.7 g/dL — ABNORMAL HIGH
Total Protein ELP: 5.9 g/dL — ABNORMAL LOW (ref 6.0–8.5)

## 2022-03-26 NOTE — Telephone Encounter (Signed)
Returned her call. She is asking if it is okay to take tylenol pm for sleep. Told her that it would be okay to take prn for sleep.

## 2022-04-06 ENCOUNTER — Inpatient Hospital Stay: Payer: Medicare Other | Admitting: Hematology and Oncology

## 2022-04-06 ENCOUNTER — Inpatient Hospital Stay: Payer: Medicare Other

## 2022-04-06 ENCOUNTER — Other Ambulatory Visit: Payer: Self-pay

## 2022-04-06 VITALS — BP 159/94 | HR 69 | Temp 98.2°F | Resp 16

## 2022-04-06 VITALS — BP 122/80 | HR 87 | Temp 98.1°F | Resp 18 | Ht 60.0 in | Wt 111.6 lb

## 2022-04-06 DIAGNOSIS — C9002 Multiple myeloma in relapse: Secondary | ICD-10-CM

## 2022-04-06 DIAGNOSIS — Z7189 Other specified counseling: Secondary | ICD-10-CM

## 2022-04-06 DIAGNOSIS — Z9481 Bone marrow transplant status: Secondary | ICD-10-CM

## 2022-04-06 DIAGNOSIS — N183 Chronic kidney disease, stage 3 unspecified: Secondary | ICD-10-CM

## 2022-04-06 DIAGNOSIS — E538 Deficiency of other specified B group vitamins: Secondary | ICD-10-CM

## 2022-04-06 DIAGNOSIS — D638 Anemia in other chronic diseases classified elsewhere: Secondary | ICD-10-CM | POA: Diagnosis not present

## 2022-04-06 DIAGNOSIS — Z5112 Encounter for antineoplastic immunotherapy: Secondary | ICD-10-CM | POA: Diagnosis not present

## 2022-04-06 LAB — CMP (CANCER CENTER ONLY)
ALT: 9 U/L (ref 0–44)
AST: 11 U/L — ABNORMAL LOW (ref 15–41)
Albumin: 3.8 g/dL (ref 3.5–5.0)
Alkaline Phosphatase: 56 U/L (ref 38–126)
Anion gap: 6 (ref 5–15)
BUN: 26 mg/dL — ABNORMAL HIGH (ref 8–23)
CO2: 24 mmol/L (ref 22–32)
Calcium: 9 mg/dL (ref 8.9–10.3)
Chloride: 105 mmol/L (ref 98–111)
Creatinine: 1.19 mg/dL — ABNORMAL HIGH (ref 0.44–1.00)
GFR, Estimated: 49 mL/min — ABNORMAL LOW (ref >60)
Glucose, Bld: 95 mg/dL (ref 70–99)
Potassium: 3.8 mmol/L (ref 3.5–5.1)
Sodium: 135 mmol/L (ref 135–145)
Total Bilirubin: 0.4 mg/dL (ref 0.3–1.2)
Total Protein: 6.4 g/dL — ABNORMAL LOW (ref 6.5–8.1)

## 2022-04-06 LAB — CBC WITH DIFFERENTIAL (CANCER CENTER ONLY)
Abs Immature Granulocytes: 0.11 10*3/uL — ABNORMAL HIGH (ref 0.00–0.07)
Basophils Absolute: 0 10*3/uL (ref 0.0–0.1)
Basophils Relative: 0 %
Eosinophils Absolute: 0 10*3/uL (ref 0.0–0.5)
Eosinophils Relative: 0 %
HCT: 31 % — ABNORMAL LOW (ref 36.0–46.0)
Hemoglobin: 10.2 g/dL — ABNORMAL LOW (ref 12.0–15.0)
Immature Granulocytes: 1 %
Lymphocytes Relative: 14 %
Lymphs Abs: 1.3 10*3/uL (ref 0.7–4.0)
MCH: 29.4 pg (ref 26.0–34.0)
MCHC: 32.9 g/dL (ref 30.0–36.0)
MCV: 89.3 fL (ref 80.0–100.0)
Monocytes Absolute: 0.5 10*3/uL (ref 0.1–1.0)
Monocytes Relative: 6 %
Neutro Abs: 7.2 10*3/uL (ref 1.7–7.7)
Neutrophils Relative %: 79 %
Platelet Count: 283 10*3/uL (ref 150–400)
RBC: 3.47 MIL/uL — ABNORMAL LOW (ref 3.87–5.11)
RDW: 18.8 % — ABNORMAL HIGH (ref 11.5–15.5)
WBC Count: 9.2 10*3/uL (ref 4.0–10.5)
nRBC: 0 % (ref 0.0–0.2)

## 2022-04-06 MED ORDER — SODIUM CHLORIDE 0.9% FLUSH
10.0000 mL | INTRAVENOUS | Status: DC | PRN
  Administered 2022-04-06: 10 mL

## 2022-04-06 MED ORDER — HEPARIN SOD (PORK) LOCK FLUSH 100 UNIT/ML IV SOLN
500.0000 [IU] | Freq: Once | INTRAVENOUS | Status: AC | PRN
  Administered 2022-04-06: 500 [IU]

## 2022-04-06 MED ORDER — SODIUM CHLORIDE 0.9% FLUSH
10.0000 mL | Freq: Once | INTRAVENOUS | Status: AC
  Administered 2022-04-06: 10 mL

## 2022-04-06 MED ORDER — SODIUM CHLORIDE 0.9 % IV SOLN: Freq: Once | INTRAVENOUS | Status: AC

## 2022-04-06 MED ORDER — DEXTROSE 5 % IV SOLN
27.0000 mg/m2 | Freq: Once | INTRAVENOUS | Status: AC
  Administered 2022-04-06: 40 mg via INTRAVENOUS
  Filled 2022-04-06: qty 15

## 2022-04-06 MED ORDER — DEXAMETHASONE 2 MG PO TABS: ORAL_TABLET | ORAL | Status: DC

## 2022-04-06 MED ORDER — SODIUM CHLORIDE 0.9 % IV SOLN
10.0000 mg | Freq: Once | INTRAVENOUS | Status: AC
  Administered 2022-04-06: 10 mg via INTRAVENOUS
  Filled 2022-04-06: qty 10

## 2022-04-06 NOTE — Progress Notes (Signed)
OFF PATHWAY REGIMEN - Multiple Myeloma and Other Plasma Cell Dyscrasias  No Change  Continue With Treatment as Ordered.  Original Decision Date/Time: 03/01/2022 12:12   [id number]:Carfilzomib 20/56 mg/m2 Monotherapy q28 Days:   A cycle is every 28 days:     Carfilzomib      Carfilzomib      Carfilzomib      Carfilzomib   **Always confirm dose/schedule in your pharmacy ordering system**  Patient Characteristics: Multiple Myeloma, Relapsed / Refractory, Second through Fourth Lines of Therapy, Frail or Not a Candidate for Triplet Therapy Disease Classification: Multiple Myeloma R-ISS Staging: III Therapeutic Status: Relapsed Line of Therapy: Fourth Line Intent of Therapy: Non-Curative / Palliative Intent, Discussed with Patient

## 2022-04-06 NOTE — Patient Instructions (Signed)
[facility] MEDICAL [facility]COLOGY  Discharge Instructions: Thank you for choosing [facility] to provide your oncology and hematology care.   If you have a lab appointment with the Cancer Center, please go directly to the Cancer Center and check in at the registration area.   Wear comfortable clothing and clothing appropriate for easy access to any Portacath or PICC line.   We strive to give you quality time with your provider. You may need to reschedule your appointment if you arrive late (15 or more minutes).  Arriving late affects you and other patients whose appointments are after yours.  Also, if you miss three or more appointments without notifying the office, you may be dismissed from the clinic at the provider's discretion.      For prescription refill requests, have your pharmacy contact our office and allow 72 hours for refills to be completed.    Today you received the following chemotherapy and/or immunotherapy agent: Carfilzomib (Kyprolis)   To help prevent nausea and vomiting after your treatment, we encourage you to take your nausea medication as directed.  BELOW ARE SYMPTOMS THAT SHOULD BE REPORTED IMMEDIATELY: *FEVER GREATER THAN 100.4 F (38 C) OR HIGHER *CHILLS OR SWEATING *NAUSEA AND VOMITING THAT IS NOT CONTROLLED WITH YOUR NAUSEA MEDICATION *UNUSUAL SHORTNESS OF BREATH *UNUSUAL BRUISING OR BLEEDING *URINARY PROBLEMS (pain or burning when urinating, or frequent urination) *BOWEL PROBLEMS (unusual diarrhea, constipation, pain near the anus) TENDERNESS IN MOUTH AND THROAT WITH OR WITHOUT PRESENCE OF ULCERS (sore throat, sores in mouth, or a toothache) UNUSUAL RASH, SWELLING OR PAIN  UNUSUAL VAGINAL DISCHARGE OR ITCHING   Items with * indicate a potential emergency and should be followed up as soon as possible or go to the Emergency Department if any problems should occur.  Please show the CHEMOTHERAPY ALERT CARD or IMMUNOTHERAPY ALERT CARD at  check-in to the Emergency Department and triage nurse.  Should you have questions after your visit or need to cancel or reschedule your appointment, please contact [facility]  Dept: [phone]  and follow the prompts.  Office hours are 8:00 a.m. to 4:30 p.m. Monday - Friday. Please note that voicemails left after 4:00 p.m. may not be returned until the following business day.  We are closed weekends and major holidays. You have access to a nurse at all times for urgent questions. Please call the main number to the clinic Dept: [phone] and follow the prompts.   For any non-urgent questions, you may also contact your provider using MyChart. We now offer e-Visits for anyone 18 and older to request care online for non-urgent symptoms. For details visit [email or web address].   Also download the MyChart app! Go to the app store, search "MyChart", open the app, select [facility], and log in with your MyChart username and password.  Masks are optional in the cancer centers. If you would like for your care team to wear a mask while they are taking care of you, please let them know. You may have one support person who is at least 72 years old accompany you for your appointments. Carfilzomib Injection What is this medication? CARFILZOMIB (kar FILZ oh mib) treats multiple myeloma, a type of bone marrow cancer. It works by blocking a protein that causes cancer cells to grow and multiply. This helps to slow or stop the spread of cancer cells. This medicine may be used for other purposes; ask your health care provider or pharmacist if you have questions.   COMMON BRAND NAME(S): KYPROLIS What should I tell my care team before I take this medication? They need to know if you have any of these conditions: Heart disease History of blood clots Irregular heartbeat Kidney disease Liver disease Lung or breathing disease An unusual or allergic reaction to carfilzomib, or other  medications, foods, dyes, or preservatives If you or your partner are pregnant or trying to get pregnant Breastfeeding How should I use this medication? This medication is injected into a vein. It is given by your care team in a hospital or clinic setting. Talk to your care team about the use of this medication in children. Special care may be needed. Overdosage: If you think you have taken too much of this medicine contact a poison control center or emergency room at once. NOTE: This medicine is only for you. Do not share this medicine with others. What if I miss a dose? Keep appointments for follow-up doses. It is important not to miss your dose. Call your care team if you are unable to keep an appointment. What may interact with this medication? Interactions are not expected. This list may not describe all possible interactions. Give your health care provider a list of all the medicines, herbs, non-prescription drugs, or dietary supplements you use. Also tell them if you smoke, drink alcohol, or use illegal drugs. Some items may interact with your medicine. What should I watch for while using this medication? Your condition will be monitored carefully while you are receiving this medication. You may need blood work while taking this medication. Check with your care team if you have severe diarrhea, nausea, and vomiting, or if you sweat a lot. The loss of too much body fluid may make it dangerous for you to take this medication. This medication may affect your coordination, reaction time, or judgment. Do not drive or operate machinery until you know how this medication affects you. Sit up or stand slowly to reduce the risk of dizzy or fainting spells. Drinking alcohol with this medication can increase the risk of these side effects. Talk to your care team if you may be pregnant. Serious birth defects can occur if you take this medication during pregnancy and for 6 months after the last dose. You  will need a negative pregnancy test before starting this medication. Contraception is recommended while taking this medication and for 6 months after the last dose. Your care team can help you find an option that works for you. If your partner can get pregnant, use a condom during sex while taking this medication and for 3 months after the last dose. Do not breastfeed while taking this medication and for 2 weeks after the last dose. This medication may cause infertility. Talk to your care team if you are concerned about your fertility. What side effects may I notice from receiving this medication? Side effects that you should report to your care team as soon as possible: Allergic reactions--skin rash, itching, hives, swelling of the face, lips, tongue, or throat Bleeding--bloody or black, tar-like stools, vomiting blood or brown material that looks like coffee grounds, red or dark brown urine, small red or purple spots on skin, unusual bruising or bleeding Blood clot--pain, swelling, or warmth in the leg, shortness of breath, chest pain Dizziness, loss of balance or coordination, confusion or trouble speaking Heart attack--pain or tightness in the chest, shoulders, arms, or jaw, nausea, shortness of breath, cold or clammy skin, feeling faint or lightheaded Heart failure--shortness of breath, swelling of  the ankles, feet, or hands, sudden weight gain, unusual weakness or fatigue Heart rhythm changes--fast or irregular heartbeat, dizziness, feeling faint or lightheaded, chest pain, trouble breathing Increase in blood pressure Infection--fever, chills, cough, sore throat, wounds that don't heal, pain or trouble when passing urine, general feeling of discomfort or being unwell Infusion reactions--chest pain, shortness of breath or trouble breathing, feeling faint or lightheaded Kidney injury--decrease in the amount of urine, swelling of the ankles, hands, or feet Liver injury--right upper belly pain,  loss of appetite, nausea, light-colored stool, dark yellow or brown urine, yellowing skin or eyes, unusual weakness or fatigue Lung injury--shortness of breath or trouble breathing, cough, spitting up blood, chest pain, fever Pulmonary hypertension--shortness of breath, chest pain, fast or irregular heartbeat, feeling faint or lightheaded, fatigue, swelling of the ankles or feet Stomach pain, bloody diarrhea, pale skin, unusual weakness or fatigue, decrease in the amount of urine, which may be signs of hemolytic uremic syndrome Sudden and severe headache, confusion, change in vision, seizures, which may be signs of posterior reversible encephalopathy syndrome (PRES) TTP--purple spots on the skin or inside the mouth, pale skin, yellowing skin or eyes, unusual weakness or fatigue, fever, fast or irregular heartbeat, confusion, change in vision, trouble speaking, trouble walking Tumor lysis syndrome (TLS)--nausea, vomiting, diarrhea, decrease in the amount of urine, dark urine, unusual weakness or fatigue, confusion, muscle pain or cramps, fast or irregular heartbeat, joint pain Side effects that usually do not require medical attention (report to your care team if they continue or are bothersome): Diarrhea Fatigue Nausea Trouble sleeping This list may not describe all possible side effects. Call your doctor for medical advice about side effects. You may report side effects to FDA at 1-800-FDA-1088. Where should I keep my medication? This medication is given in a hospital or clinic. It will not be stored at home. NOTE: This sheet is a summary. It may not cover all possible information. If you have questions about this medicine, talk to your doctor, pharmacist, or health care provider.  2023 Elsevier/Gold Standard (2021-12-23 00:00:00)  

## 2022-04-07 ENCOUNTER — Encounter: Payer: Self-pay | Admitting: Hematology and Oncology

## 2022-04-07 ENCOUNTER — Other Ambulatory Visit: Payer: Self-pay

## 2022-04-07 LAB — KAPPA/LAMBDA LIGHT CHAINS
Kappa free light chain: 63.3 mg/L — ABNORMAL HIGH (ref 3.3–19.4)
Kappa, lambda light chain ratio: 31.65 — ABNORMAL HIGH (ref 0.26–1.65)
Lambda free light chains: 2 mg/L — ABNORMAL LOW (ref 5.7–26.3)

## 2022-04-07 NOTE — Assessment & Plan Note (Signed)
She will continue monthly B12 inj

## 2022-04-07 NOTE — Assessment & Plan Note (Signed)
She has chronic intermittent pancytopenia, 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 Observe closely This is not the cause of her dizziness

## 2022-04-07 NOTE — Assessment & Plan Note (Signed)
She felt better with addition of daily dexamethasone We will start Kyprolis as scheduled and I plan to follow myeloma panel monthly, results are pending I recommend reducing dose of dexamethasone to 2 mg to take 5 days a week She is reminded to take acyclovir, calcium and vitamin D

## 2022-04-07 NOTE — Assessment & Plan Note (Signed)
She has intermittent elevated serum creatinine We discussed the importance of adequate hydration  She will try her best to drink more liquids

## 2022-04-07 NOTE — Progress Notes (Unsigned)
[facility] OFFICE PROGRESS NOTE  Patient Care Team: Josetta Huddle, MD as PCP - General (Internal Medicine) Buford Dresser, MD as PCP - Cardiology (Cardiology) Ginette Pitman, MD as Consulting Physician (Hematology and Oncology) Hessie Dibble, MD as Consulting Physician (Hematology and Oncology)  ASSESSMENT & PLAN:  Multiple myeloma in relapse [facility]) She felt better with addition of daily dexamethasone We will start Kyprolis as scheduled and I plan to follow myeloma panel monthly, results are pending I recommend reducing dose of dexamethasone to 2 mg to take 5 days a week She is reminded to take acyclovir, calcium and vitamin D   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, 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 Observe closely This is not the cause of her dizziness  Vitamin B12 deficiency She will continue monthly B12 inj  Chronic kidney disease, stage III (moderate) She has intermittent elevated serum creatinine We discussed the importance of adequate hydration  She will try her best to drink more liquids  Orders Placed This Encounter  Procedures   CBC with Differential ([place] Only)    Standing Status:   Future    Standing Expiration Date:   04/21/2023   CMP ([place] only)    Standing Status:   Future    Standing Expiration Date:   04/21/2023   CBC with Differential ([place] Only)    Standing Status:   Future    Standing Expiration Date:   04/28/2023   CMP ([place] only)    Standing Status:   Future    Standing Expiration Date:   04/28/2023   CBC with Differential ([place] Only)    Standing Status:   Future    Standing Expiration Date:   05/12/2023   CMP ([place] only)    Standing Status:   Future    Standing Expiration Date:   05/12/2023   CBC with Differential ([place] Only)    Standing Status:   Future    Standing Expiration Date:   05/19/2023   CMP ([place] only)     Standing Status:   Future    Standing Expiration Date:   05/19/2023   CBC with Differential ([place] Only)    Standing Status:   Future    Standing Expiration Date:   05/26/2023   CMP ([place] only)    Standing Status:   Future    Standing Expiration Date:   05/26/2023   CBC with Differential ([place] Only)    Standing Status:   Future    Standing Expiration Date:   06/09/2023   CMP ([place] only)    Standing Status:   Future    Standing Expiration Date:   06/09/2023   CBC with Differential ([place] Only)    Standing Status:   Future    Standing Expiration Date:   06/16/2023   CMP ([place] only)    Standing Status:   Future    Standing Expiration Date:   06/16/2023   CBC with Differential ([place] Only)    Standing Status:   Future    Standing Expiration Date:   06/23/2023   CMP ([place] only)    Standing Status:   Future    Standing Expiration Date:   06/23/2023   CBC with Differential ([place] Only)    Standing Status:   Future    Standing Expiration Date:   07/07/2023   CMP ([place] only)  Standing Status:   Future    Standing Expiration Date:   07/07/2023   CBC with Differential (Cancer Center Only)    Standing Status:   Future    Standing Expiration Date:   07/14/2023   CMP ([place] only)    Standing Status:   Future    Standing Expiration Date:   07/14/2023   CBC with Differential (Cancer Center Only)    Standing Status:   Future    Standing Expiration Date:   07/21/2023   CMP ([place] only)    Standing Status:   Future    Standing Expiration Date:   07/21/2023   CBC with Differential ([place] Only)    Standing Status:   Future    Standing Expiration Date:   08/04/2023   CMP ([place] only)    Standing Status:   Future    Standing Expiration Date:   08/04/2023   CBC with Differential ([place] Only)    Standing Status:   Future    Standing Expiration Date:   08/11/2023    CMP ([place] only)    Standing Status:   Future    Standing Expiration Date:   08/11/2023   CBC with Differential ([place] Only)    Standing Status:   Future    Standing Expiration Date:   08/18/2023   CMP ([place] only)    Standing Status:   Future    Standing Expiration Date:   08/18/2023    All questions were answered. The patient knows to call the clinic with any problems, questions or concerns. The total time spent in the appointment was 30 minutes encounter with patients including review of chart and various tests results, discussions about plan of care and coordination of care plan   [name], MD 04/07/2022 3:07 PM  INTERVAL HISTORY: Please see below for problem oriented charting. she returns for treatment follow-up with her husband No recent infection or bone pain Her dizziness is improved No recent nausea  REVIEW OF SYSTEMS:   Constitutional: Denies fevers, chills or abnormal weight loss Eyes: Denies blurriness of vision Ears, nose, mouth, throat, and face: Denies mucositis or sore throat Respiratory: Denies cough, dyspnea or wheezes Cardiovascular: Denies palpitation, chest discomfort or lower extremity swelling Gastrointestinal:  Denies nausea, heartburn or change in bowel habits Skin: Denies abnormal skin rashes Lymphatics: Denies new lymphadenopathy or easy bruising Neurological:Denies numbness, tingling or new weaknesses Behavioral/Psych: Mood is stable, no new changes  All other systems were reviewed with the patient and are negative.  I have reviewed the past medical history, past surgical history, social history and family history with the patient and they are unchanged from previous note.  ALLERGIES:  is allergic to augmentin [amoxicillin-pot clavulanate], albuterol, codeine, ibuprofen, nsaids, tolmetin, and tetanus-diphth-acell pertussis.  MEDICATIONS:  Current Outpatient Medications  Medication Sig Dispense Refill   acetaminophen (TYLENOL)  500 MG tablet Take 500-1,000 mg by mouth every 6 (six) hours as needed for mild pain, moderate pain, fever or headache.     acyclovir (ZOVIRAX) 400 MG tablet Take 1 tablet (400 mg total) by mouth daily. 30 tablet 3   dexamethasone (DECADRON) 2 MG tablet Take daily except on Tuesdays and Fridays     lidocaine-prilocaine (EMLA) cream Apply to affected area once 30 g 3   loratadine (CLARITIN) 10 MG tablet Take 10 mg by mouth daily as needed for allergies.     Multiple Vitamins-Minerals (CENTRUM SILVER PO) Take 1 tablet by mouth daily.  omeprazole (PRILOSEC) 20 MG capsule TAKE 1 CAPSULE(20 MG) BY MOUTH DAILY 90 capsule 11   ondansetron (ZOFRAN) 8 MG tablet Take 1 tablet (8 mg total) by mouth 2 (two) times daily as needed (Nausea or vomiting). 30 tablet 1   prochlorperazine (COMPAZINE) 10 MG tablet Take 1 tablet (10 mg total) by mouth every 6 (six) hours as needed (Nausea or vomiting). 30 tablet 1   senna-docusate (SENOKOT-S) 8.6-50 MG tablet Take 1-2 tablets by mouth daily as needed for mild constipation or moderate constipation.     sodium chloride (OCEAN) 0.65 % SOLN nasal spray Place 1 spray into both nostrils as needed for congestion.     vitamin B-12 (CYANOCOBALAMIN) 500 MCG tablet Take 500 mcg by mouth daily.     No current facility-administered medications for this visit.    SUMMARY OF ONCOLOGIC HISTORY: Oncology History Overview Note   M-protein 0.69 gm/dl IFIX - IgG, Kappa IgG - 868 IgA - 19 IgM - < 20 Kappa - 21 Lambda - 5.7  09/06/2014 - Bone marrow aspirate and biopsy:   Normocellular marrow for age (40%) with a small monoclonal plasma cell population (1% on aspirate). Karyotype 42, XX  FISH Negative for myeloma associated changes  09/12/2014 - PET/CT  Two regions that are concerning for disease, one adjacent/involving the left ninth rib and one in the marrow of the right femur, in this patient with history of plasmacytoma.    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   04/01/2015 Bone Marrow Transplant   She received melphalan chemotherapy on 03/31/2015 followed by autologous stem cell transplant the day after   04/03/2015 - 04/18/2015 Hospital Admission   The patient was admitted to the hospital at [facility] for management related to complication from stem cell transplant. She had significant nausea requiring intravenous anti-emetics.   07/17/2015 -  Chemotherapy   She started maintenance Revlimid and monthly zometa, then every 3 months   06/01/2018 Imaging   DEXA scan showed bone density T score in femur -2.3   04/20/2019 PET scan   1. No FDG avid osseous lesions or mass identified to suggest metabolically active lesion of myeloma or plasmacytoma. 2. Small nodular density within the paravertebral right lower lobe exhibits mild to moderate increased uptake within SUV max of 3.38. This is indeterminate. Review of CT chest from 10/05/2018 shows a corresponding Lung nodule in this area measuring the same. Small pulmonary  neoplasm cannot be excluded. 3. Indeterminate,  focal area of increased uptake is identified within the thoracic canal. Indeterminate favored to represent benign physiologic CNS activity.     04/30/2019 - 01/21/2022 Chemotherapy   Patient is on Treatment Plan : MYELOMA SQ Daratumumab Faspro q28d     04/30/2019 - 07/09/2019 Chemotherapy   The patient had bortezomib SQ (VELCADE) chemo injection 1.75 mg, 1.3 mg/m2 = 1.75 mg, Subcutaneous,  Once, 10 of 11 cycles Administration: 1.75 mg (04/30/2019), 1.75 mg (05/07/2019), 1.75 mg (05/14/2019), 1.75 mg (05/21/2019), 1.75 mg (05/28/2019), 1.75 mg (06/04/2019), 1.75 mg (06/11/2019), 1.75 mg (06/18/2019), 1.75 mg (06/25/2019), 1.75 mg (07/09/2019)  for chemotherapy treatment.    03/05/2022 Procedure   Successful placement of a right IJ approach Power Port with ultrasound and fluoroscopic guidance. The catheter is ready for use   03/09/2022 - 04/06/2022 Chemotherapy   Patient is on Treatment Plan : MYELOMA RELAPSED/REFRACTORY Carfilzomib + Dexamethasone (Kd) weekly q28d     03/09/2022 -  Chemotherapy   Patient is on Treatment Plan : MYELOMA RELAPSED/REFRACTORY Carfilzomib (20/70) D1,8,15 + Dexamethasone weekly (40) (Kd) q28d  x 9 cycles / Dexamethasone D1,8,15       PHYSICAL EXAMINATION: ECOG PERFORMANCE STATUS: 1 - Symptomatic but completely ambulatory  Vitals:   04/06/22 1137  BP: 122/80  Pulse: 87  Resp: 18  Temp: 98.1 F (36.7 C)  SpO2: 100%   Filed Weights   04/06/22 1137  Weight: 111 lb 9.6 oz (50.6 kg)    GENERAL:alert, no distress and comfortable NEURO: alert & oriented x 3 with fluent speech, no focal motor/sensory deficits  LABORATORY DATA:  I have reviewed the data as listed    Component Value Date/Time   NA 135 04/06/2022 1106   NA 143 07/14/2017 1245   K 3.8 04/06/2022 1106   K 3.3 (L) 07/14/2017 1245   CL 105 04/06/2022 1106   CO2 24 04/06/2022 1106   CO2 20 (L) 07/14/2017 1245   GLUCOSE 95 04/06/2022 1106   GLUCOSE 106  07/14/2017 1245   BUN 26 (H) 04/06/2022 1106   BUN 9.3 07/14/2017 1245   CREATININE 1.19 (H) 04/06/2022 1106   CREATININE 0.8 07/14/2017 1245   CALCIUM 9.0 04/06/2022 1106   CALCIUM 8.4 07/14/2017 1245   PROT 6.4 (L) 04/06/2022 1106   PROT 6.0 07/14/2017 1245   PROT 6.2 (L) 07/14/2017 1245   ALBUMIN 3.8 04/06/2022 1106   ALBUMIN 3.6 07/14/2017 1245   AST 11 (L) 04/06/2022 1106   AST 21 07/14/2017 1245   ALT 9 04/06/2022 1106   ALT 21 07/14/2017 1245   ALKPHOS 56 04/06/2022 1106   ALKPHOS 78 07/14/2017 1245   BILITOT 0.4 04/06/2022 1106   BILITOT 0.43 07/14/2017 1245   GFRNONAA 49 (L) 04/06/2022 1106   GFRAA 57 (L) 04/21/2020 0817   GFRAA >60 01/14/2020 0828    No results found for: "SPEP", "UPEP"  Lab Results  Component Value Date   WBC 9.2 04/06/2022   NEUTROABS 7.2 04/06/2022   HGB 10.2 (L) 04/06/2022   HCT 31.0 (L) 04/06/2022   MCV 89.3 04/06/2022   PLT 283 04/06/2022      Chemistry      Component Value Date/Time   NA 135 04/06/2022 1106   NA 143 07/14/2017 1245   K 3.8 04/06/2022 1106   K 3.3 (L) 07/14/2017 1245   CL 105 04/06/2022 1106   CO2 24 04/06/2022 1106   CO2 20 (L) 07/14/2017 1245   BUN 26 (H) 04/06/2022 1106   BUN 9.3 07/14/2017 1245  CREATININE 1.19 (H) 04/06/2022 1106   CREATININE 0.8 07/14/2017 1245      Component Value Date/Time   CALCIUM 9.0 04/06/2022 1106   CALCIUM 8.4 07/14/2017 1245   ALKPHOS 56 04/06/2022 1106   ALKPHOS 78 07/14/2017 1245   AST 11 (L) 04/06/2022 1106   AST 21 07/14/2017 1245   ALT 9 04/06/2022 1106   ALT 21 07/14/2017 1245   BILITOT 0.4 04/06/2022 1106   BILITOT 0.43 07/14/2017 1245

## 2022-04-08 ENCOUNTER — Encounter: Payer: Self-pay | Admitting: Hematology and Oncology

## 2022-04-09 ENCOUNTER — Other Ambulatory Visit: Payer: Self-pay

## 2022-04-14 LAB — MULTIPLE MYELOMA PANEL, SERUM
Albumin SerPl Elph-Mcnc: 3.4 g/dL (ref 2.9–4.4)
Albumin/Glob SerPl: 1.3 (ref 0.7–1.7)
Alpha 1: 0.2 g/dL (ref 0.0–0.4)
Alpha2 Glob SerPl Elph-Mcnc: 0.9 g/dL (ref 0.4–1.0)
B-Globulin SerPl Elph-Mcnc: 0.8 g/dL (ref 0.7–1.3)
Gamma Glob SerPl Elph-Mcnc: 0.8 g/dL (ref 0.4–1.8)
Globulin, Total: 2.7 g/dL (ref 2.2–3.9)
IgA: 10 mg/dL — ABNORMAL LOW (ref 64–422)
IgG (Immunoglobin G), Serum: 909 mg/dL (ref 586–1602)
IgM (Immunoglobulin M), Srm: <5 mg/dL — ABNORMAL LOW (ref 26–217)
M Protein SerPl Elph-Mcnc: 0.6 g/dL — ABNORMAL HIGH
Total Protein ELP: 6.1 g/dL (ref 6.0–8.5)

## 2022-04-15 ENCOUNTER — Telehealth: Payer: Self-pay

## 2022-04-15 NOTE — Telephone Encounter (Signed)
-----   Message from [name], MD sent at 04/15/2022  8:43 AM EDT ----- Pls let her know MM panel is better Remind her to drink more liquids prior to future treatment

## 2022-04-15 NOTE — Telephone Encounter (Signed)
Called and given below message. She verbalized understanding and appreciated the call . °

## 2022-04-19 ENCOUNTER — Telehealth: Payer: Self-pay

## 2022-04-19 MED FILL — Dexamethasone Sodium Phosphate Inj 100 MG/10ML: INTRAMUSCULAR | Qty: 1 | Status: AC

## 2022-04-19 NOTE — Telephone Encounter (Signed)
Returned her call. She feels tired. Over the weekend she has been having some nausea and vomiting. She has been taking the zofran only. She will start alternating the zofran/ compazine. Complaining of cramps in her thighs. She is able to eat and drink today. She will try to drink more fluids today. She asking for appt. Scheduled to see Katherene Ponto, PA in symptom management tomorrow at [facility], she will see in the infusion room.  FYI

## 2022-04-20 ENCOUNTER — Other Ambulatory Visit: Payer: Self-pay

## 2022-04-20 ENCOUNTER — Inpatient Hospital Stay: Payer: Medicare Other | Attending: Hematology and Oncology

## 2022-04-20 ENCOUNTER — Inpatient Hospital Stay: Payer: Medicare Other

## 2022-04-20 ENCOUNTER — Inpatient Hospital Stay (HOSPITAL_BASED_OUTPATIENT_CLINIC_OR_DEPARTMENT_OTHER): Payer: Medicare Other | Admitting: Physician Assistant

## 2022-04-20 ENCOUNTER — Other Ambulatory Visit: Payer: Self-pay | Admitting: Hematology and Oncology

## 2022-04-20 VITALS — BP 140/82 | HR 69 | Temp 97.9°F | Resp 17 | Wt 107.5 lb

## 2022-04-20 DIAGNOSIS — C9002 Multiple myeloma in relapse: Secondary | ICD-10-CM

## 2022-04-20 DIAGNOSIS — Z79899 Other long term (current) drug therapy: Secondary | ICD-10-CM | POA: Insufficient documentation

## 2022-04-20 DIAGNOSIS — Z9481 Bone marrow transplant status: Secondary | ICD-10-CM

## 2022-04-20 DIAGNOSIS — E860 Dehydration: Secondary | ICD-10-CM | POA: Insufficient documentation

## 2022-04-20 DIAGNOSIS — Z880 Allergy status to penicillin: Secondary | ICD-10-CM | POA: Diagnosis not present

## 2022-04-20 DIAGNOSIS — N183 Chronic kidney disease, stage 3 unspecified: Secondary | ICD-10-CM | POA: Insufficient documentation

## 2022-04-20 DIAGNOSIS — K5900 Constipation, unspecified: Secondary | ICD-10-CM | POA: Insufficient documentation

## 2022-04-20 DIAGNOSIS — Z7961 Long term (current) use of immunomodulator: Secondary | ICD-10-CM | POA: Insufficient documentation

## 2022-04-20 DIAGNOSIS — Z7952 Long term (current) use of systemic steroids: Secondary | ICD-10-CM | POA: Insufficient documentation

## 2022-04-20 DIAGNOSIS — Z7189 Other specified counseling: Secondary | ICD-10-CM

## 2022-04-20 DIAGNOSIS — Z79624 Long term (current) use of inhibitors of nucleotide synthesis: Secondary | ICD-10-CM | POA: Insufficient documentation

## 2022-04-20 DIAGNOSIS — R112 Nausea with vomiting, unspecified: Secondary | ICD-10-CM | POA: Insufficient documentation

## 2022-04-20 DIAGNOSIS — Z9484 Stem cells transplant status: Secondary | ICD-10-CM | POA: Diagnosis not present

## 2022-04-20 DIAGNOSIS — Z7969 Long term (current) use of other immunomodulators and immunosuppressants: Secondary | ICD-10-CM | POA: Insufficient documentation

## 2022-04-20 DIAGNOSIS — Z5112 Encounter for antineoplastic immunotherapy: Secondary | ICD-10-CM | POA: Insufficient documentation

## 2022-04-20 DIAGNOSIS — Z881 Allergy status to other antibiotic agents status: Secondary | ICD-10-CM | POA: Diagnosis not present

## 2022-04-20 LAB — CBC WITH DIFFERENTIAL (CANCER CENTER ONLY)
Abs Immature Granulocytes: 0.08 10*3/uL — ABNORMAL HIGH (ref 0.00–0.07)
Basophils Absolute: 0 10*3/uL (ref 0.0–0.1)
Basophils Relative: 0 %
Eosinophils Absolute: 0.1 10*3/uL (ref 0.0–0.5)
Eosinophils Relative: 1 %
HCT: 32.1 % — ABNORMAL LOW (ref 36.0–46.0)
Hemoglobin: 10.8 g/dL — ABNORMAL LOW (ref 12.0–15.0)
Immature Granulocytes: 1 %
Lymphocytes Relative: 24 %
Lymphs Abs: 2.2 10*3/uL (ref 0.7–4.0)
MCH: 29.7 pg (ref 26.0–34.0)
MCHC: 33.6 g/dL (ref 30.0–36.0)
MCV: 88.2 fL (ref 80.0–100.0)
Monocytes Absolute: 0.4 10*3/uL (ref 0.1–1.0)
Monocytes Relative: 4 %
Neutro Abs: 6.5 10*3/uL (ref 1.7–7.7)
Neutrophils Relative %: 70 %
Platelet Count: 302 10*3/uL (ref 150–400)
RBC: 3.64 MIL/uL — ABNORMAL LOW (ref 3.87–5.11)
RDW: 17.6 % — ABNORMAL HIGH (ref 11.5–15.5)
WBC Count: 9.3 10*3/uL (ref 4.0–10.5)
nRBC: 0 % (ref 0.0–0.2)

## 2022-04-20 LAB — CMP (CANCER CENTER ONLY)
ALT: 8 U/L (ref 0–44)
AST: 10 U/L — ABNORMAL LOW (ref 15–41)
Albumin: 3.9 g/dL (ref 3.5–5.0)
Alkaline Phosphatase: 68 U/L (ref 38–126)
Anion gap: 8 (ref 5–15)
BUN: 35 mg/dL — ABNORMAL HIGH (ref 8–23)
CO2: 22 mmol/L (ref 22–32)
Calcium: 9.3 mg/dL (ref 8.9–10.3)
Chloride: 103 mmol/L (ref 98–111)
Creatinine: 1.69 mg/dL — ABNORMAL HIGH (ref 0.44–1.00)
GFR, Estimated: 32 mL/min — ABNORMAL LOW (ref >60)
Glucose, Bld: 154 mg/dL — ABNORMAL HIGH (ref 70–99)
Potassium: 3.2 mmol/L — ABNORMAL LOW (ref 3.5–5.1)
Sodium: 133 mmol/L — ABNORMAL LOW (ref 135–145)
Total Bilirubin: 0.3 mg/dL (ref 0.3–1.2)
Total Protein: 7 g/dL (ref 6.5–8.1)

## 2022-04-20 MED ORDER — SODIUM CHLORIDE 0.9 % IV SOLN: Freq: Once | INTRAVENOUS | Status: AC

## 2022-04-20 MED ORDER — SODIUM CHLORIDE 0.9% FLUSH: 3.0000 mL | Freq: Once | INTRAVENOUS | Status: DC | PRN

## 2022-04-20 MED ORDER — SODIUM CHLORIDE 0.9% FLUSH
10.0000 mL | Freq: Once | INTRAVENOUS | Status: AC | PRN
  Administered 2022-04-20: 10 mL

## 2022-04-20 MED ORDER — HEPARIN SOD (PORK) LOCK FLUSH 100 UNIT/ML IV SOLN: 250.0000 [IU] | Freq: Once | INTRAVENOUS | Status: DC | PRN

## 2022-04-20 MED ORDER — HEPARIN SOD (PORK) LOCK FLUSH 100 UNIT/ML IV SOLN
500.0000 [IU] | Freq: Once | INTRAVENOUS | Status: AC | PRN
  Administered 2022-04-20: 500 [IU]

## 2022-04-20 MED ORDER — ALTEPLASE 2 MG IJ SOLR: 2.0000 mg | Freq: Once | INTRAMUSCULAR | Status: DC | PRN

## 2022-04-20 MED ORDER — SODIUM CHLORIDE 0.9% FLUSH
10.0000 mL | Freq: Once | INTRAVENOUS | Status: AC
  Administered 2022-04-20: 10 mL

## 2022-04-20 NOTE — Progress Notes (Signed)
Symptom Management Consult note [place]    Patient Care Team: Josetta Huddle, MD as PCP - General (Internal Medicine) Buford Dresser, MD as PCP - Cardiology (Cardiology) Ginette Pitman, MD as Consulting Physician (Hematology and Oncology) Hessie Dibble, MD as Consulting Physician (Hematology and Oncology)    Name of the patient: Jessica [name]  [id number]  1951/07/17   Date of visit: 04/20/2022   Chief Complaint/Reason for visit:  nausea and vomiting   Current Therapy: Carfilzomib  Last treatment:  Day 1   Cycle 2 on 04/06/22   ASSESSMENT & PLAN: Patient is a 71 y.o. female  with oncologic history of multiple myeloma followed by Dr. Alvy Bimler.  I have viewed most recent oncology note and lab work.    #) Multiple Myeloma -Oncologist advises to hold treatment today 2/2 dehydration.  - Next appointment with oncologist is 05/11/22   #) Dehydration -CMP today showing concern for dehydration with BUN/Cr 35/1.69 with baseline Cr around 1. Patient given liter of IVF. Has known history of CKD stage 3 -Nausea and vomiting resolved and patient tolerating PO intake today. Benign abdominal exam. Strongly encouraged her to increase fluid intake at home. -Strict ED precautions discussed should symptoms worsen.       Heme/Onc History: Oncology History Overview Note   M-protein 0.69 gm/dl IFIX - IgG, Kappa IgG - 868 IgA - 19 IgM - < 20 Kappa - 21 Lambda - 5.7  09/06/2014 - Bone marrow aspirate and biopsy:   Normocellular marrow for age (40%) with a small monoclonal plasma cell population (1% on aspirate). Karyotype 66, XX  FISH Negative for myeloma associated changes  09/12/2014 - PET/CT  Two regions that are concerning for disease, one adjacent/involving the left ninth rib and one in the marrow of the right femur, in this patient with history of plasmacytoma.    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   04/01/2015 Bone Marrow Transplant   She received melphalan chemotherapy on 03/31/2015 followed by autologous stem cell transplant the day after   04/03/2015 - 04/18/2015 Hospital Admission   The patient was admitted to the hospital at [place] for management related to complication from stem cell transplant. She had significant nausea requiring intravenous anti-emetics.   07/17/2015 -  Chemotherapy   She started maintenance Revlimid and monthly zometa, then every 3 months   06/01/2018 Imaging   DEXA scan showed bone density T score in femur -  2.3   04/20/2019 PET scan   1. No FDG avid osseous lesions or mass identified to suggest metabolically active lesion of myeloma or plasmacytoma. 2. Small nodular density within the paravertebral right lower lobe exhibits mild to moderate increased uptake within SUV max of 3.38. This is indeterminate. Review of CT chest from 10/05/2018 shows a corresponding Lung nodule in this area measuring the same. Small pulmonary neoplasm cannot be excluded. 3.  Indeterminate, focal area of increased uptake is identified within the thoracic canal. Indeterminate favored to represent benign physiologic CNS activity.     04/30/2019 - 01/21/2022 Chemotherapy   Patient is on Treatment Plan : MYELOMA SQ Daratumumab Faspro q28d     04/30/2019 - 07/09/2019 Chemotherapy   The patient had bortezomib SQ (VELCADE) chemo injection 1.75 mg, 1.3 mg/m2 = 1.75 mg, Subcutaneous,  Once, 10 of 11 cycles Administration: 1.75 mg (04/30/2019), 1.75 mg (05/07/2019), 1.75 mg (05/14/2019), 1.75 mg (05/21/2019), 1.75 mg (05/28/2019), 1.75 mg (06/04/2019), 1.75 mg (06/11/2019), 1.75 mg (06/18/2019), 1.75 mg (06/25/2019), 1.75 mg (07/09/2019)  for chemotherapy treatment.    03/05/2022 Procedure   Successful placement of a right IJ approach Power Port with ultrasound and fluoroscopic guidance. The catheter is ready for use   03/09/2022 - 04/06/2022 Chemotherapy   Patient is on Treatment Plan : MYELOMA RELAPSED/REFRACTORY Carfilzomib + Dexamethasone (Kd) weekly q28d     03/09/2022 -  Chemotherapy   Patient is on Treatment Plan : MYELOMA RELAPSED/REFRACTORY Carfilzomib (20/70) D1,8,15 + Dexamethasone weekly (40) (Kd) q28d  x 9 cycles / Dexamethasone D1,8,15         Interval history-: Jessica [name] is a 71 y.o. female with oncologic history as above presenting to [facility] today with chief complaint of nausea and vomiting. Patient sates over the weekend (x3 days ago) She experienced one day of nausea and vomiting. She reports 2 episodes of NBNB emesis. She alternated zofran and compazine and symptoms resolved. She admits to not drinking as much water over the last 2 days because of the nausea. She was able to eat breakfast and drink a glass of water this morning. She had a bowel movement today that was normal. She does have constipation sometimes and plans to take a stool softener if her stool becomes hard. She has a stool softener at home. She feels "back to her usual self now."  She denies any  suspicious food intake or sick contacts.      ROS  All other systems are reviewed and are negative for acute change except as noted in the HPI.    Allergies  Allergen Reactions   Augmentin [Amoxicillin-Pot Clavulanate] Hives, Itching, Nausea And Vomiting and Rash    ++Tolerates Cefepime++ Has patient had a PCN reaction causing immediate rash, facial/tongue/throat swelling, SOB or lightheadedness with hypotension: Yes Has patient had a PCN reaction causing severe rash involving mucus membranes or skin necrosis: Yes Has patient had a PCN reaction that required hospitalization No Has patient had a PCN reaction occurring within the last 10 years: Yes If all of the above answers are "NO", then may proceed with Cephalosporin use. *okay to take other types of cillin   Albuterol Other (See Comments)    Elevated HR and BP   Codeine Nausea Only   Ibuprofen Nausea Only   Nsaids Other (See Comments)    Gi Upset   Tolmetin Other (See Comments)    Gi Upset   Tetanus-Diphth-Acell Pertussis Rash    Received injection in ER on 8/1, developed fine  rash all over body. Cellulitis to injection site on left deltoid.     Past Medical History:  Diagnosis Date   H/O stem cell transplant [facility]) 03/2015   [facility]   Multiple myeloma [facility]) 11/19/2014   Multiple myeloma ([place])    Multiple myeloma (HCC)    MVP (mitral valve prolapse)    req prophylaxis   Other constipation 12/03/2014   Right ovarian cyst 10/16/15   16 mm simple cyst - ultrasound yearly.   Upper respiratory infection, acute 08/12/2015     Past Surgical History:  Procedure Laterality Date   CATARACT EXTRACTION, BILATERAL  10/2010   Implants(ReSTOR) Bilat   IR IMAGING GUIDED PORT INSERTION  03/04/2022   LASER ABLATION OF CONDYLOMAS  1990   CIN 1 cervix   multiple myeloma surgery Right 08/2014   [place] History   Socioeconomic History   Marital status: Married    Spouse name: Not on file   Number of  children: Not on file   Years of education: Not on file   Highest education level: Not on file  Occupational History   Not on file  Tobacco Use   Smoking status: Never   Smokeless tobacco: Never  Vaping Use   Vaping Use: Never used  Substance and Sexual Activity   Alcohol use: No    Alcohol/week: 0.0 standard drinks of alcohol   Drug use: No   Sexual activity: Not Currently    Partners: Male    Birth control/protection: Post-menopausal    Comment: vasectomy  Other Topics Concern   Not on file  Social History Narrative   Not on file   Social Determinants of Health   Financial Resource Strain: Not on file  Food Insecurity: Not on file  Transportation Needs: Not on file  Physical Activity: Not on file  Stress: Not on file  Social Connections: Not on file  Intimate Partner Violence: Not on file    Family History  Problem Relation Age of Onset   Osteoporosis Mother    Diabetes Father      Current Outpatient Medications:    acetaminophen (TYLENOL) 500 MG tablet, Take 500-1,000 mg by mouth every 6 (six) hours as needed for mild pain, moderate pain, fever or headache., Disp: , Rfl:    acyclovir (ZOVIRAX) 400 MG tablet, Take 1 tablet (400 mg total) by mouth daily., Disp: 30 tablet, Rfl: 3   dexamethasone (DECADRON) 2 MG tablet, Take daily except on Tuesdays and Fridays, Disp: , Rfl:    lidocaine-prilocaine (EMLA) cream, Apply to affected area once, Disp: 30 g, Rfl: 3   loratadine (CLARITIN) 10 MG tablet, Take 10 mg by mouth daily as needed for allergies., Disp: , Rfl:    Multiple Vitamins-Minerals (CENTRUM SILVER PO), Take 1 tablet by mouth daily. , Disp: , Rfl:    omeprazole (PRILOSEC) 20 MG capsule, TAKE 1 CAPSULE(20 MG) BY MOUTH DAILY, Disp: 90 capsule, Rfl: 11   ondansetron (ZOFRAN) 8 MG tablet, Take 1 tablet (8 mg total) by mouth 2 (two) times daily as needed (Nausea or vomiting)., Disp: 30 tablet, Rfl: 1   prochlorperazine (COMPAZINE) 10 MG tablet, Take 1 tablet (10 mg  total) by mouth every 6 (six) hours as needed (Nausea or vomiting)., Disp: 30 tablet, Rfl: 1   senna-docusate (SENOKOT-S) 8.6-50 MG tablet, Take 1-2 tablets by mouth daily as needed for mild constipation or moderate constipation., Disp: , Rfl:    sodium chloride (OCEAN) 0.65 % SOLN nasal spray,  Place 1 spray into both nostrils as needed for congestion., Disp: , Rfl:    vitamin B-12 (CYANOCOBALAMIN) 500 MCG tablet, Take 500 mcg by mouth daily., Disp: , Rfl:  No current facility-administered medications for this visit.  Facility-Administered Medications Ordered in Other Visits:    alteplase (CATHFLO ACTIVASE) injection 2 mg, 2 mg, Intracatheter, Once PRN, Alvy Bimler, Ni, MD   heparin lock flush 100 unit/mL, 500 Units, Intracatheter, Once PRN, Alvy Bimler, Ni, MD   heparin lock flush 100 unit/mL, 250 Units, Intracatheter, Once PRN, Alvy Bimler, Ni, MD   sodium chloride flush (NS) 0.9 % injection 10 mL, 10 mL, Intracatheter, Once PRN, Alvy Bimler, Ni, MD   sodium chloride flush (NS) 0.9 % injection 3 mL, 3 mL, Intracatheter, Once PRN, Alvy Bimler, Ni, MD  PHYSICAL EXAM: ECOG FS:1 - Symptomatic but completely ambulatory  T: 97.9   BP: 132/93   HR: 98   Resp: 17   O2: 100% Physical Exam Vitals and nursing note reviewed.  Constitutional:      Appearance: She is well-developed. She is not ill-appearing or toxic-appearing.  HENT:     Head: Normocephalic.     Nose: Nose normal.     Mouth/Throat:     Mouth: Mucous membranes are dry.  Eyes:     Conjunctiva/sclera: Conjunctivae normal.  Neck:     Vascular: No JVD.  Cardiovascular:     Rate and Rhythm: Normal rate and regular rhythm.     Pulses: Normal pulses.     Heart sounds: Normal heart sounds.  Pulmonary:     Effort: Pulmonary effort is normal.     Breath sounds: Normal breath sounds.  Abdominal:     General: Bowel sounds are normal. There is no distension.     Palpations: Abdomen is soft. There is no mass.     Tenderness: There is no abdominal  tenderness. There is no guarding or rebound.     Hernia: No hernia is present.  Musculoskeletal:     Cervical back: Normal range of motion.     Right lower leg: No edema.     Left lower leg: No edema.  Skin:    General: Skin is warm and dry.  Neurological:     Mental Status: She is oriented to person, place, and time.        LABORATORY DATA: I have reviewed the data as listed    Latest Ref Rng & Units 04/20/2022   11:38 AM 04/06/2022   11:06 AM 03/23/2022   11:29 AM  CBC  WBC 4.0 - 10.5 K/uL 9.3  9.2  8.8   Hemoglobin 12.0 - 15.0 g/dL 10.8  10.2  10.1   Hematocrit 36.0 - 46.0 % 32.1  31.0  29.9   Platelets 150 - 400 K/uL 302  283  180         Latest Ref Rng & Units 04/20/2022   11:38 AM 04/06/2022   11:06 AM 03/23/2022   11:29 AM  CMP  Glucose 70 - 99 mg/dL 154  95  91   BUN 8 - 23 mg/dL 35  26  21   Creatinine 0.44 - 1.00 mg/dL 1.69  1.19  1.11   Sodium 135 - 145 mmol/L 133  135  137   Potassium 3.5 - 5.1 mmol/L 3.2  3.8  3.2   Chloride 98 - 111 mmol/L 103  105  108   CO2 22 - 32 mmol/L _0 Calcium 8.9 - 10.3 mg/dL 9.3  9.0  8.5   Total Protein 6.5 - 8.1 g/dL 7.0  6.4  6.2   Total Bilirubin 0.3 - 1.2 mg/dL 0.3  0.4  0.5   Alkaline Phos 38 - 126 U/L 68  56  57   AST 15 - 41 U/L _0 ALT 0 - 44 U/L _1 RADIOGRAPHIC STUDIES (from last 24 hours if applicable) I have personally reviewed the radiological images as listed and agreed with the findings in the report. No results found.      Visit Diagnosis: 1. Multiple myeloma in relapse (HCC)   2. Dehydration      No orders of the defined types were placed in this encounter.   All questions were answered. The patient knows to call the clinic with any problems, questions or concerns. No barriers to learning was detected.  I have spent a total of 20 minutes minutes of face-to-face and non-face-to-face time, preparing to see the patient, obtaining and/or reviewing separately obtained  history, performing a medically appropriate examination, counseling and educating the patient, documenting clinical information in the electronic health record, and care coordination (communications with other health care professionals or caregivers).    Thank you for allowing me to participate in the care of this patient.    Barrie Folk, PA-C Department of Hematology/Oncology [facility] at [facility] Phone: [phone]  [phone]    04/20/2022 2:25 PM

## 2022-04-20 NOTE — Progress Notes (Signed)
Per Alvy Bimler MD, no tx today. Pt to receive IVF and will be assessed by symptom management PA.

## 2022-04-20 NOTE — Patient Instructions (Signed)
Dehydration, Adult Dehydration is a condition in which there is not enough water or other fluids in the body. This happens when a person loses more fluids than he or she takes in. Important organs, such as the kidneys, brain, and heart, cannot function without a proper amount of fluids. Any loss of fluids from the body can lead to dehydration. Dehydration can be mild, moderate, or severe. It should be treated right away to prevent it from becoming severe. What are the causes? Dehydration may be caused by: Conditions that cause loss of water or other fluids, such as diarrhea, vomiting, or sweating or urinating a lot. Not drinking enough fluids, especially when you are ill or doing activities that require a lot of energy. Other illnesses and conditions, such as fever or infection. Certain medicines, such as medicines that remove excess fluid from the body (diuretics). Lack of safe drinking water. Not being able to get enough water and food. What increases the risk? The following factors may make you more likely to develop this condition: Having a long-term (chronic) illness that has not been treated properly, such as diabetes, heart disease, or kidney disease. Being 65 years of age or older. Having a disability. Living in a place that is high in altitude, where thinner, drier air causes more fluid loss. Doing exercises that put stress on your body for a long time (endurance sports). What are the signs or symptoms? Symptoms of dehydration depend on how severe it is. Mild or moderate dehydration Thirst. Dry lips or dry mouth. Dizziness or light-headedness, especially when standing up from a seated position. Muscle cramps. Dark urine. Urine may be the color of tea. Less urine or tears produced than usual. Headache. Severe dehydration Changes in skin. Your skin may be cold and clammy, blotchy, or pale. Your skin also may not return to normal after being lightly pinched and released. Little or  no tears, urine, or sweat. Changes in vital signs, such as rapid breathing and low blood pressure. Your pulse may be weak or may be faster than 100 beats a minute when you are sitting still. Other changes, such as: Feeling very thirsty. Sunken eyes. Cold hands and feet. Confusion. Being very tired (lethargic) or having trouble waking from sleep. Short-term weight loss. Loss of consciousness. How is this diagnosed? This condition is diagnosed based on your symptoms and a physical exam. You may have blood and urine tests to help confirm the diagnosis. How is this treated? Treatment for this condition depends on how severe it is. Treatment should be started right away. Do not wait until dehydration becomes severe. Severe dehydration is an emergency and needs to be treated in a hospital. Mild or moderate dehydration can be treated at home. You may be asked to: Drink more fluids. Drink an oral rehydration solution (ORS). This drink helps restore proper amounts of fluids and salts and minerals in the blood (electrolytes). Severe dehydration can be treated: With IV fluids. By correcting abnormal levels of electrolytes. This is often done by giving electrolytes through a tube that is passed through your nose and into your stomach (nasogastric tube, or NG tube). By treating the underlying cause of dehydration. Follow these instructions at home: Oral rehydration solution If told by your health care provider, drink an ORS: Make an ORS by following instructions on the package. Start by drinking small amounts, about  cup (120 mL) every 5-10 minutes. Slowly increase how much you drink until you have taken the amount recommended by your health   care provider. Eating and drinking        Drink enough clear fluid to keep your urine pale yellow. If you were told to drink an ORS, finish the ORS first and then start slowly drinking other clear fluids. Drink fluids such as: Water. Do not drink only  water. Doing that can lead to hyponatremia, which is having too little salt (sodium) in the body. Water from ice chips you suck on. Fruit juice that you have added water to (diluted fruit juice). Low-calorie sports drinks. Eat foods that contain a healthy balance of electrolytes, such as bananas, oranges, potatoes, tomatoes, and spinach. Do not drink alcohol. Avoid the following: Drinks that contain a lot of sugar. These include high-calorie sports drinks, fruit juice that is not diluted, and soda. Caffeine. Foods that are greasy or contain a lot of fat or sugar. General instructions Take over-the-counter and prescription medicines only as told by your health care provider. Do not take sodium tablets. Doing that can lead to having too much sodium in the body (hypernatremia). Return to your normal activities as told by your health care provider. Ask your health care provider what activities are safe for you. Keep all follow-up visits as told by your health care provider. This is important. Contact a health care provider if: You have muscle cramps, pain, or discomfort, such as: Pain in your abdomen and the pain gets worse or stays in one area (localizes). Stiff neck. You have a rash. You are more irritable than usual. You are sleepier or have a harder time waking than usual. You feel weak or dizzy. You feel very thirsty. Get help right away if you have: Any symptoms of severe dehydration. Symptoms of vomiting, such as: You cannot eat or drink without vomiting. Vomiting gets worse or does not go away. Vomit includes blood or green matter (bile). Symptoms that get worse with treatment. A fever. A severe headache. Problems with urination or bowel movements, such as: Diarrhea that gets worse or does not go away. Blood in your stool (feces). This may cause stool to look black and tarry. Not urinating, or urinating only a small amount of very dark urine, within 6-8 hours. Trouble  breathing. These symptoms may represent a serious problem that is an emergency. Do not wait to see if the symptoms will go away. Get medical help right away. Call your local emergency services (911 in the [place] Do not drive yourself to the hospital. Summary Dehydration is a condition in which there is not enough water or other fluids in the body. This happens when a person loses more fluids than he or she takes in. Treatment for this condition depends on how severe it is. Treatment should be started right away. Do not wait until dehydration becomes severe. Drink enough clear fluid to keep your urine pale yellow. If you were told to drink an oral rehydration solution (ORS), finish the ORS first and then start slowly drinking other clear fluids. Take over-the-counter and prescription medicines only as told by your health care provider. Get help right away if you have any symptoms of severe dehydration. This information is not intended to replace advice given to you by your health care provider. Make sure you discuss any questions you have with your health care provider. Document Revised: 12/02/2021 Document Reviewed: 03/08/2019 Elsevier Patient Education  2023 [employer]  

## 2022-04-21 LAB — KAPPA/LAMBDA LIGHT CHAINS
Kappa free light chain: 66.7 mg/L — ABNORMAL HIGH (ref 3.3–19.4)
Kappa, lambda light chain ratio: 39.24 — ABNORMAL HIGH (ref 0.26–1.65)
Lambda free light chains: 1.7 mg/L — ABNORMAL LOW (ref 5.7–26.3)

## 2022-04-22 ENCOUNTER — Other Ambulatory Visit: Payer: Self-pay

## 2022-04-26 LAB — MULTIPLE MYELOMA PANEL, SERUM
Albumin SerPl Elph-Mcnc: 3.1 g/dL (ref 2.9–4.4)
Albumin/Glob SerPl: 1 (ref 0.7–1.7)
Alpha 1: 0.3 g/dL (ref 0.0–0.4)
Alpha2 Glob SerPl Elph-Mcnc: 1.2 g/dL — ABNORMAL HIGH (ref 0.4–1.0)
B-Globulin SerPl Elph-Mcnc: 0.9 g/dL (ref 0.7–1.3)
Gamma Glob SerPl Elph-Mcnc: 0.9 g/dL (ref 0.4–1.8)
Globulin, Total: 3.3 g/dL (ref 2.2–3.9)
IgA: 10 mg/dL — ABNORMAL LOW (ref 64–422)
IgG (Immunoglobin G), Serum: 975 mg/dL (ref 586–1602)
IgM (Immunoglobulin M), Srm: <5 mg/dL — ABNORMAL LOW (ref 26–217)
M Protein SerPl Elph-Mcnc: 0.7 g/dL — ABNORMAL HIGH
Total Protein ELP: 6.4 g/dL (ref 6.0–8.5)

## 2022-04-26 MED FILL — Dexamethasone Sodium Phosphate Inj 100 MG/10ML: INTRAMUSCULAR | Qty: 1 | Status: AC

## 2022-04-27 ENCOUNTER — Inpatient Hospital Stay: Payer: Medicare Other

## 2022-04-27 ENCOUNTER — Other Ambulatory Visit: Payer: Self-pay

## 2022-04-27 ENCOUNTER — Telehealth: Payer: Self-pay

## 2022-04-27 VITALS — BP 129/90 | HR 99 | Temp 98.5°F | Resp 19

## 2022-04-27 DIAGNOSIS — Z7189 Other specified counseling: Secondary | ICD-10-CM

## 2022-04-27 DIAGNOSIS — C9002 Multiple myeloma in relapse: Secondary | ICD-10-CM

## 2022-04-27 DIAGNOSIS — Z9481 Bone marrow transplant status: Secondary | ICD-10-CM

## 2022-04-27 DIAGNOSIS — Z5112 Encounter for antineoplastic immunotherapy: Secondary | ICD-10-CM | POA: Diagnosis not present

## 2022-04-27 LAB — CBC WITH DIFFERENTIAL (CANCER CENTER ONLY)
Abs Immature Granulocytes: 0.04 10*3/uL (ref 0.00–0.07)
Basophils Absolute: 0.1 10*3/uL (ref 0.0–0.1)
Basophils Relative: 1 %
Eosinophils Absolute: 0.3 10*3/uL (ref 0.0–0.5)
Eosinophils Relative: 4 %
HCT: 32.5 % — ABNORMAL LOW (ref 36.0–46.0)
Hemoglobin: 10.8 g/dL — ABNORMAL LOW (ref 12.0–15.0)
Immature Granulocytes: 1 %
Lymphocytes Relative: 38 %
Lymphs Abs: 2.7 10*3/uL (ref 0.7–4.0)
MCH: 29.4 pg (ref 26.0–34.0)
MCHC: 33.2 g/dL (ref 30.0–36.0)
MCV: 88.6 fL (ref 80.0–100.0)
Monocytes Absolute: 0.6 10*3/uL (ref 0.1–1.0)
Monocytes Relative: 8 %
Neutro Abs: 3.6 10*3/uL (ref 1.7–7.7)
Neutrophils Relative %: 48 %
Platelet Count: 321 10*3/uL (ref 150–400)
RBC: 3.67 MIL/uL — ABNORMAL LOW (ref 3.87–5.11)
RDW: 16.5 % — ABNORMAL HIGH (ref 11.5–15.5)
WBC Count: 7.2 10*3/uL (ref 4.0–10.5)
nRBC: 0 % (ref 0.0–0.2)

## 2022-04-27 LAB — CMP (CANCER CENTER ONLY)
ALT: 7 U/L (ref 0–44)
AST: 11 U/L — ABNORMAL LOW (ref 15–41)
Albumin: 3.8 g/dL (ref 3.5–5.0)
Alkaline Phosphatase: 70 U/L (ref 38–126)
Anion gap: 5 (ref 5–15)
BUN: 13 mg/dL (ref 8–23)
CO2: 27 mmol/L (ref 22–32)
Calcium: 9.1 mg/dL (ref 8.9–10.3)
Chloride: 102 mmol/L (ref 98–111)
Creatinine: 0.83 mg/dL (ref 0.44–1.00)
GFR, Estimated: >60 mL/min (ref >60)
Glucose, Bld: 91 mg/dL (ref 70–99)
Potassium: 3.7 mmol/L (ref 3.5–5.1)
Sodium: 134 mmol/L — ABNORMAL LOW (ref 135–145)
Total Bilirubin: 0.5 mg/dL (ref 0.3–1.2)
Total Protein: 7.2 g/dL (ref 6.5–8.1)

## 2022-04-27 MED ORDER — SODIUM CHLORIDE 0.9 % IV SOLN: Freq: Once | INTRAVENOUS | Status: AC

## 2022-04-27 MED ORDER — SODIUM CHLORIDE 0.9% FLUSH
10.0000 mL | Freq: Once | INTRAVENOUS | Status: AC
  Administered 2022-04-27: 10 mL

## 2022-04-27 MED ORDER — HEPARIN SOD (PORK) LOCK FLUSH 100 UNIT/ML IV SOLN
500.0000 [IU] | Freq: Once | INTRAVENOUS | Status: AC | PRN
  Administered 2022-04-27: 500 [IU]

## 2022-04-27 MED ORDER — SODIUM CHLORIDE 0.9% FLUSH
10.0000 mL | Freq: Once | INTRAVENOUS | Status: AC | PRN
  Administered 2022-04-27: 10 mL

## 2022-04-27 MED ORDER — DEXAMETHASONE 4 MG PO TABS: 4.0000 mg | ORAL_TABLET | Freq: Every day | ORAL | 0 refills | Status: DC

## 2022-04-27 NOTE — Patient Instructions (Signed)
Rehydration, Elderly Rehydration is the replacement of body fluids, salts, and minerals (electrolytes) that are lost during dehydration. Dehydration is when there is not enough water or other fluids in the body. This happens when you lose more fluids than you take in. People who are age 72 or older have a higher risk of dehydration than younger adults. Common causes of dehydration include: Conditions that cause loss of water or other fluids, such as diarrhea, vomiting, sweating, or urinating a lot. Not drinking enough fluids. This can occur when you are ill or doing activities that require a lot of energy, especially in hot weather. Other illnesses and conditions, such as fever or infection. Certain medicines, such as those that remove excess fluid from the body (diuretics). Not being able to get enough water and food. Symptoms of mild or moderate dehydration may include thirst, dry lips and mouth, and dizziness. Symptoms of severe dehydration may include increased heart rate, confusion, fainting, and not urinating. For severe dehydration, you may need to get fluids through an IV at the hospital. For mild or moderate dehydration, you can usually rehydrate at home by drinking certain fluids as told by your health care provider. What are the risks? Generally, rehydration is safe. However, taking in too much fluid (overhydration) can be a problem. This is rare. Overhydration can cause an electrolyte imbalance, kidney failure, fluid in the lungs, or a decrease in salt (sodium) levels in the body. Supplies needed: You will need an oral rehydration solution (ORS) if your health care provider tells you to use one. This is a drink designed to treat dehydration. It can be found in pharmacies and retail stores. How to rehydrate Fluids Follow instructions from your health care provider for rehydration. The kind of fluid and the amount you should drink depend on your condition. In general, for mild dehydration,  you should choose drinks that you prefer. If told by your health care provider, drink an ORS. Make an ORS by following instructions on the package. Start by drinking small amounts, about  cup (120 mL) every 5-10 minutes. Slowly increase how much you drink until you have taken the amount recommended by your health care provider. Drink enough fluids to keep your urine pale yellow. If you were told to drink an ORS, finish the ORS first, then start slowly drinking other clear fluids. Drink fluids such as: Water. This includes sparkling water and flavored water. Drinking only waterwhile rehydrating can lead to having too little sodium in your body (hyponatremia). Follow instructions from your health care provider. Water from ice chips you suck on. Fruit juice with water you add to it(diluted). Sports drinks. Hot or cold herbal teas. Broth-based soups. Coffee. Milk or milk products. Food Follow instructions from your health care provider about what to eat while you rehydrate. Your health care provider may recommend that you slowly begin eating regular foods in small amounts. Eat foods that contain a healthy balance of electrolytes, such as bananas, oranges, potatoes, tomatoes, and spinach. Avoid foods that are greasy or contain a lot of sugar. In some cases, you may get nutrition through a feeding tube that is passed through your nose and into your stomach (nasogastric tube, or NG tube). This may be done if you have uncontrolled vomiting or diarrhea. Beverages to avoid  Certain beverages may make dehydration worse. While you rehydrate, avoid drinking alcohol. How to tell if you are recovering from dehydration You may be recovering from dehydration if: You are urinating more often than before   you started rehydrating. Your urine is pale yellow. Your energy level improves. You vomit less frequently. You have diarrhea less frequently. Your appetite improves or returns to normal. You feel less  dizzy or less light-headed. Your skin tone and color start to look more normal. Follow these instructions at home: Take over-the-counter and prescription medicines only as told by your health care provider. Do not take sodium tablets. Doing this can lead to having too much sodium in your body (hypernatremia). Contact a health care provider if: You continue to have symptoms of mild or moderate dehydration, such as: Thirst. Dry lips. Slightly dry mouth. Dizziness. Dark urine or less urine than usual. Muscle cramps. You continue to vomit or have diarrhea. Get help right away if you: Have symptoms of dehydration that get worse. Have a fever. Have a severe headache. Have been vomiting and the following happens: Your vomiting gets worse. Your vomit includes blood or green matter (bile). You cannot eat or drink without vomiting. Have problems with urination or bowel movements, such as: Diarrhea that gets worse. Blood in your stool (feces). This may cause stool to look black and tarry. Not urinating, or urinating only a small amount of very dark urine, within 6-8 hours. Have trouble breathing. Have symptoms that get worse with treatment. These symptoms may represent a serious problem that is an emergency. Do not wait to see if the symptoms will go away. Get medical help right away. Call your local emergency services (911 in the [place] Do not drive yourself to the hospital. Summary Rehydration is the replacement of body fluids, salts, and minerals (electrolytes) that are lost during dehydration. Follow instructions from your health care provider for rehydration. The kind of fluid and the amount you should drink depend on your condition. Slowly increase how much you drink until you have taken the amount recommended by your health care provider. Contact your health care provider if you continue to show signs of mild or moderate dehydration. This information is not intended to replace advice  given to you by your health care provider. Make sure you discuss any questions you have with your health care provider. Document Revised: 09/26/2019 Document Reviewed: 09/13/2019 Elsevier Patient Education  2023 Elsevier Inc.  

## 2022-04-27 NOTE — Telephone Encounter (Signed)
I agree with IVF if needed.

## 2022-04-27 NOTE — Telephone Encounter (Signed)
Received a call from the scheduler that she called to cancel today's appts due to not feeling well. Called her and added appts back for today after speaking with her. She is complaining of headache and taking tylenol that is helping some. She is eating as much as she can and trying to drink fluids. Complaining of nausea. She takes compazine at times. Instructed to alternate compazine and Zofran per the bottle instructions. Complaining of being dizzy.  She will arrive at 1130 for port lab flush and 1230 infusion appt.

## 2022-04-28 LAB — KAPPA/LAMBDA LIGHT CHAINS
Kappa free light chain: 49.6 mg/L — ABNORMAL HIGH (ref 3.3–19.4)
Kappa, lambda light chain ratio: 31 — ABNORMAL HIGH (ref 0.26–1.65)
Lambda free light chains: 1.6 mg/L — ABNORMAL LOW (ref 5.7–26.3)

## 2022-05-01 ENCOUNTER — Other Ambulatory Visit: Payer: Self-pay

## 2022-05-03 ENCOUNTER — Other Ambulatory Visit: Payer: Self-pay

## 2022-05-03 DIAGNOSIS — C9002 Multiple myeloma in relapse: Secondary | ICD-10-CM

## 2022-05-03 LAB — MULTIPLE MYELOMA PANEL, SERUM
Albumin SerPl Elph-Mcnc: 3.3 g/dL (ref 2.9–4.4)
Albumin/Glob SerPl: 1.1 (ref 0.7–1.7)
Alpha 1: 0.3 g/dL (ref 0.0–0.4)
Alpha2 Glob SerPl Elph-Mcnc: 1.1 g/dL — ABNORMAL HIGH (ref 0.4–1.0)
B-Globulin SerPl Elph-Mcnc: 1 g/dL (ref 0.7–1.3)
Gamma Glob SerPl Elph-Mcnc: 0.9 g/dL (ref 0.4–1.8)
Globulin, Total: 3.3 g/dL (ref 2.2–3.9)
IgA: 9 mg/dL — ABNORMAL LOW (ref 64–422)
IgG (Immunoglobin G), Serum: 1087 mg/dL (ref 586–1602)
IgM (Immunoglobulin M), Srm: <5 mg/dL — ABNORMAL LOW (ref 26–217)
M Protein SerPl Elph-Mcnc: 0.7 g/dL — ABNORMAL HIGH
Total Protein ELP: 6.6 g/dL (ref 6.0–8.5)

## 2022-05-03 MED FILL — Dexamethasone Sodium Phosphate Inj 100 MG/10ML: INTRAMUSCULAR | Qty: 1 | Status: AC

## 2022-05-03 NOTE — Progress Notes (Signed)
bc

## 2022-05-04 ENCOUNTER — Inpatient Hospital Stay: Payer: Medicare Other

## 2022-05-04 ENCOUNTER — Other Ambulatory Visit: Payer: Self-pay

## 2022-05-04 VITALS — BP 126/83 | HR 63 | Temp 97.8°F | Resp 18 | Ht 60.0 in | Wt 110.5 lb

## 2022-05-04 DIAGNOSIS — Z9481 Bone marrow transplant status: Secondary | ICD-10-CM

## 2022-05-04 DIAGNOSIS — C9002 Multiple myeloma in relapse: Secondary | ICD-10-CM

## 2022-05-04 DIAGNOSIS — Z5112 Encounter for antineoplastic immunotherapy: Secondary | ICD-10-CM | POA: Diagnosis not present

## 2022-05-04 DIAGNOSIS — Z7189 Other specified counseling: Secondary | ICD-10-CM

## 2022-05-04 LAB — CBC WITH DIFFERENTIAL (CANCER CENTER ONLY)
Abs Immature Granulocytes: 0.15 10*3/uL — ABNORMAL HIGH (ref 0.00–0.07)
Basophils Absolute: 0 10*3/uL (ref 0.0–0.1)
Basophils Relative: 0 %
Eosinophils Absolute: 0 10*3/uL (ref 0.0–0.5)
Eosinophils Relative: 0 %
HCT: 28.6 % — ABNORMAL LOW (ref 36.0–46.0)
Hemoglobin: 9.7 g/dL — ABNORMAL LOW (ref 12.0–15.0)
Immature Granulocytes: 2 %
Lymphocytes Relative: 28 %
Lymphs Abs: 2.2 10*3/uL (ref 0.7–4.0)
MCH: 29.9 pg (ref 26.0–34.0)
MCHC: 33.9 g/dL (ref 30.0–36.0)
MCV: 88.3 fL (ref 80.0–100.0)
Monocytes Absolute: 0.7 10*3/uL (ref 0.1–1.0)
Monocytes Relative: 9 %
Neutro Abs: 4.6 10*3/uL (ref 1.7–7.7)
Neutrophils Relative %: 61 %
Platelet Count: 382 10*3/uL (ref 150–400)
RBC: 3.24 MIL/uL — ABNORMAL LOW (ref 3.87–5.11)
RDW: 16.8 % — ABNORMAL HIGH (ref 11.5–15.5)
WBC Count: 7.6 10*3/uL (ref 4.0–10.5)
nRBC: 0 % (ref 0.0–0.2)

## 2022-05-04 LAB — CMP (CANCER CENTER ONLY)
ALT: 6 U/L (ref 0–44)
AST: 9 U/L — ABNORMAL LOW (ref 15–41)
Albumin: 3.7 g/dL (ref 3.5–5.0)
Alkaline Phosphatase: 55 U/L (ref 38–126)
Anion gap: 5 (ref 5–15)
BUN: 23 mg/dL (ref 8–23)
CO2: 24 mmol/L (ref 22–32)
Calcium: 8.8 mg/dL — ABNORMAL LOW (ref 8.9–10.3)
Chloride: 106 mmol/L (ref 98–111)
Creatinine: 0.96 mg/dL (ref 0.44–1.00)
GFR, Estimated: >60 mL/min (ref >60)
Glucose, Bld: 86 mg/dL (ref 70–99)
Potassium: 3.8 mmol/L (ref 3.5–5.1)
Sodium: 135 mmol/L (ref 135–145)
Total Bilirubin: 0.5 mg/dL (ref 0.3–1.2)
Total Protein: 6.5 g/dL (ref 6.5–8.1)

## 2022-05-04 MED ORDER — HEPARIN SOD (PORK) LOCK FLUSH 100 UNIT/ML IV SOLN
500.0000 [IU] | Freq: Once | INTRAVENOUS | Status: AC | PRN
  Administered 2022-05-04: 500 [IU]

## 2022-05-04 MED ORDER — SODIUM CHLORIDE 0.9% FLUSH
10.0000 mL | Freq: Once | INTRAVENOUS | Status: AC
  Administered 2022-05-04: 10 mL

## 2022-05-04 MED ORDER — SODIUM CHLORIDE 0.9% FLUSH
10.0000 mL | INTRAVENOUS | Status: DC | PRN
  Administered 2022-05-04: 10 mL

## 2022-05-04 MED ORDER — SODIUM CHLORIDE 0.9 % IV SOLN: Freq: Once | INTRAVENOUS | Status: AC

## 2022-05-04 MED ORDER — DEXTROSE 5 % IV SOLN
27.0000 mg/m2 | Freq: Once | INTRAVENOUS | Status: AC
  Administered 2022-05-04: 40 mg via INTRAVENOUS
  Filled 2022-05-04: qty 15

## 2022-05-04 MED ORDER — SODIUM CHLORIDE 0.9 % IV SOLN
10.0000 mg | Freq: Once | INTRAVENOUS | Status: AC
  Administered 2022-05-04: 10 mg via INTRAVENOUS
  Filled 2022-05-04: qty 10

## 2022-05-04 MED ORDER — DEXAMETHASONE 4 MG PO TABS: 4.0000 mg | ORAL_TABLET | Freq: Every day | ORAL | 0 refills | Status: DC

## 2022-05-04 NOTE — Patient Instructions (Signed)
[name]  Discharge Instructions: Thank you for choosing [name] to provide your oncology and hematology care.   If you have a lab appointment with the [name], please go directly to the [name] and check in at the registration area.   Wear comfortable clothing and clothing appropriate for easy access to any Portacath or PICC line.   We strive to give you quality time with your provider. You may need to reschedule your appointment if you arrive late (15 or more minutes).  Arriving late affects you and other patients whose appointments are after yours.  Also, if you miss three or more appointments without notifying the office, you may be dismissed from the clinic at the provider's discretion.      For prescription refill requests, have your pharmacy contact our office and allow 72 hours for refills to be completed.    Today you received the following chemotherapy and/or immunotherapy agent: Carfilzomib (Kyprolis)   To help prevent nausea and vomiting after your treatment, we encourage you to take your nausea medication as directed.  BELOW ARE SYMPTOMS THAT SHOULD BE REPORTED IMMEDIATELY: *FEVER GREATER THAN 100.4 F (38 C) OR HIGHER *CHILLS OR SWEATING *NAUSEA AND VOMITING THAT IS NOT CONTROLLED WITH YOUR NAUSEA MEDICATION *UNUSUAL SHORTNESS OF BREATH *UNUSUAL BRUISING OR BLEEDING *URINARY PROBLEMS (pain or burning when urinating, or frequent urination) *BOWEL PROBLEMS (unusual diarrhea, constipation, pain near the anus) TENDERNESS IN MOUTH AND THROAT WITH OR WITHOUT PRESENCE OF ULCERS (sore throat, sores in mouth, or a toothache) UNUSUAL RASH, SWELLING OR PAIN  UNUSUAL VAGINAL DISCHARGE OR ITCHING   Items with * indicate a potential emergency and should be followed up as soon as possible or go to the Emergency Department if any problems should occur.  Please show the CHEMOTHERAPY ALERT CARD or IMMUNOTHERAPY ALERT CARD at  check-in to the Emergency Department and triage nurse.  Should you have questions after your visit or need to cancel or reschedule your appointment, please contact [name]  Dept: [phone]  and follow the prompts.  Office hours are 8:00 a.m. to 4:30 p.m. Monday - Friday. Please note that voicemails left after 4:00 p.m. may not be returned until the following business day.  We are closed weekends and major holidays. You have access to a nurse at all times for urgent questions. Please call the main number to the clinic Dept: [phone] and follow the prompts.   For any non-urgent questions, you may also contact your provider using MyChart. We now offer e-Visits for anyone 18 and older to request care online for non-urgent symptoms. For details visit [email or web address].   Also download the MyChart app! Go to the app store, search "MyChart", open the app, select [facility], and log in with your MyChart username and password.  Masks are optional in the cancer centers. If you would like for your care team to wear a mask while they are taking care of you, please let them know. You may have one support person who is at least 72 years old accompany you for your appointments. Carfilzomib Injection What is this medication? CARFILZOMIB (kar FILZ oh mib) treats multiple myeloma, a type of bone marrow cancer. It works by blocking a protein that causes cancer cells to grow and multiply. This helps to slow or stop the spread of cancer cells. This medicine may be used for other purposes; ask your health care provider or pharmacist if you have questions.   COMMON BRAND NAME(S): KYPROLIS What should I tell my care team before I take this medication? They need to know if you have any of these conditions: Heart disease History of blood clots Irregular heartbeat Kidney disease Liver disease Lung or breathing disease An unusual or allergic reaction to carfilzomib, or other  medications, foods, dyes, or preservatives If you or your partner are pregnant or trying to get pregnant Breastfeeding How should I use this medication? This medication is injected into a vein. It is given by your care team in a hospital or clinic setting. Talk to your care team about the use of this medication in children. Special care may be needed. Overdosage: If you think you have taken too much of this medicine contact a poison [name] or emergency room at once. NOTE: This medicine is only for you. Do not share this medicine with others. What if I miss a dose? Keep appointments for follow-up doses. It is important not to miss your dose. Call your care team if you are unable to keep an appointment. What may interact with this medication? Interactions are not expected. This list may not describe all possible interactions. Give your health care provider a list of all the medicines, herbs, non-prescription drugs, or dietary supplements you use. Also tell them if you smoke, drink alcohol, or use illegal drugs. Some items may interact with your medicine. What should I watch for while using this medication? Your condition will be monitored carefully while you are receiving this medication. You may need blood work while taking this medication. Check with your care team if you have severe diarrhea, nausea, and vomiting, or if you sweat a lot. The loss of too much body fluid may make it dangerous for you to take this medication. This medication may affect your coordination, reaction time, or judgment. Do not drive or operate machinery until you know how this medication affects you. Sit up or stand slowly to reduce the risk of dizzy or fainting spells. Drinking alcohol with this medication can increase the risk of these side effects. Talk to your care team if you may be pregnant. Serious birth defects can occur if you take this medication during pregnancy and for 6 months after the last dose. You  will need a negative pregnancy test before starting this medication. Contraception is recommended while taking this medication and for 6 months after the last dose. Your care team can help you find an option that works for you. If your partner can get pregnant, use a condom during sex while taking this medication and for 3 months after the last dose. Do not breastfeed while taking this medication and for 2 weeks after the last dose. This medication may cause infertility. Talk to your care team if you are concerned about your fertility. What side effects may I notice from receiving this medication? Side effects that you should report to your care team as soon as possible: Allergic reactions--skin rash, itching, hives, swelling of the face, lips, tongue, or throat Bleeding--bloody or black, tar-like stools, vomiting blood or brown material that looks like coffee grounds, red or dark brown urine, small red or purple spots on skin, unusual bruising or bleeding Blood clot--pain, swelling, or warmth in the leg, shortness of breath, chest pain Dizziness, loss of balance or coordination, confusion or trouble speaking Heart attack--pain or tightness in the chest, shoulders, arms, or jaw, nausea, shortness of breath, cold or clammy skin, feeling faint or lightheaded Heart failure--shortness of breath, swelling of  the ankles, feet, or hands, sudden weight gain, unusual weakness or fatigue Heart rhythm changes--fast or irregular heartbeat, dizziness, feeling faint or lightheaded, chest pain, trouble breathing Increase in blood pressure Infection--fever, chills, cough, sore throat, wounds that don't heal, pain or trouble when passing urine, general feeling of discomfort or being unwell Infusion reactions--chest pain, shortness of breath or trouble breathing, feeling faint or lightheaded Kidney injury--decrease in the amount of urine, swelling of the ankles, hands, or feet Liver injury--right upper belly pain,  loss of appetite, nausea, light-colored stool, dark yellow or brown urine, yellowing skin or eyes, unusual weakness or fatigue Lung injury--shortness of breath or trouble breathing, cough, spitting up blood, chest pain, fever Pulmonary hypertension--shortness of breath, chest pain, fast or irregular heartbeat, feeling faint or lightheaded, fatigue, swelling of the ankles or feet Stomach pain, bloody diarrhea, pale skin, unusual weakness or fatigue, decrease in the amount of urine, which may be signs of hemolytic uremic syndrome Sudden and severe headache, confusion, change in vision, seizures, which may be signs of posterior reversible encephalopathy syndrome (PRES) TTP--purple spots on the skin or inside the mouth, pale skin, yellowing skin or eyes, unusual weakness or fatigue, fever, fast or irregular heartbeat, confusion, change in vision, trouble speaking, trouble walking Tumor lysis syndrome (TLS)--nausea, vomiting, diarrhea, decrease in the amount of urine, dark urine, unusual weakness or fatigue, confusion, muscle pain or cramps, fast or irregular heartbeat, joint pain Side effects that usually do not require medical attention (report to your care team if they continue or are bothersome): Diarrhea Fatigue Nausea Trouble sleeping This list may not describe all possible side effects. Call your doctor for medical advice about side effects. You may report side effects to FDA at 1-800-FDA-1088. Where should I keep my medication? This medication is given in a hospital or clinic. It will not be stored at home. NOTE: This sheet is a summary. It may not cover all possible information. If you have questions about this medicine, talk to your doctor, pharmacist, or health care provider.  2023 Elsevier/Gold Standard (2021-12-23 00:00:00)  

## 2022-05-11 ENCOUNTER — Ambulatory Visit: Payer: Medicare Other

## 2022-05-11 ENCOUNTER — Ambulatory Visit: Payer: Medicare Other | Admitting: Hematology and Oncology

## 2022-05-11 ENCOUNTER — Other Ambulatory Visit: Payer: Medicare Other

## 2022-05-17 MED FILL — Dexamethasone Sodium Phosphate Inj 100 MG/10ML: INTRAMUSCULAR | Qty: 1 | Status: AC

## 2022-05-18 ENCOUNTER — Inpatient Hospital Stay: Payer: Medicare Other | Attending: Hematology and Oncology

## 2022-05-18 ENCOUNTER — Inpatient Hospital Stay (HOSPITAL_BASED_OUTPATIENT_CLINIC_OR_DEPARTMENT_OTHER): Payer: Medicare Other | Admitting: Hematology and Oncology

## 2022-05-18 ENCOUNTER — Inpatient Hospital Stay: Payer: Medicare Other

## 2022-05-18 ENCOUNTER — Other Ambulatory Visit: Payer: Self-pay | Admitting: Hematology and Oncology

## 2022-05-18 ENCOUNTER — Other Ambulatory Visit: Payer: Self-pay

## 2022-05-18 ENCOUNTER — Encounter: Payer: Self-pay | Admitting: Hematology and Oncology

## 2022-05-18 VITALS — BP 149/83 | HR 71 | Resp 18 | Ht 60.0 in | Wt 113.6 lb

## 2022-05-18 DIAGNOSIS — D638 Anemia in other chronic diseases classified elsewhere: Secondary | ICD-10-CM | POA: Insufficient documentation

## 2022-05-18 DIAGNOSIS — Z7189 Other specified counseling: Secondary | ICD-10-CM

## 2022-05-18 DIAGNOSIS — R42 Dizziness and giddiness: Secondary | ICD-10-CM | POA: Insufficient documentation

## 2022-05-18 DIAGNOSIS — Z79624 Long term (current) use of inhibitors of nucleotide synthesis: Secondary | ICD-10-CM | POA: Diagnosis not present

## 2022-05-18 DIAGNOSIS — Z9481 Bone marrow transplant status: Secondary | ICD-10-CM

## 2022-05-18 DIAGNOSIS — Z833 Family history of diabetes mellitus: Secondary | ICD-10-CM | POA: Diagnosis not present

## 2022-05-18 DIAGNOSIS — Z8262 Family history of osteoporosis: Secondary | ICD-10-CM | POA: Diagnosis not present

## 2022-05-18 DIAGNOSIS — D61818 Other pancytopenia: Secondary | ICD-10-CM | POA: Insufficient documentation

## 2022-05-18 DIAGNOSIS — B372 Candidiasis of skin and nail: Secondary | ICD-10-CM | POA: Insufficient documentation

## 2022-05-18 DIAGNOSIS — R112 Nausea with vomiting, unspecified: Secondary | ICD-10-CM | POA: Diagnosis not present

## 2022-05-18 DIAGNOSIS — Z7969 Long term (current) use of other immunomodulators and immunosuppressants: Secondary | ICD-10-CM | POA: Insufficient documentation

## 2022-05-18 DIAGNOSIS — C9002 Multiple myeloma in relapse: Secondary | ICD-10-CM

## 2022-05-18 DIAGNOSIS — Z79899 Other long term (current) drug therapy: Secondary | ICD-10-CM | POA: Diagnosis not present

## 2022-05-18 DIAGNOSIS — Z880 Allergy status to penicillin: Secondary | ICD-10-CM | POA: Diagnosis not present

## 2022-05-18 DIAGNOSIS — Z887 Allergy status to serum and vaccine status: Secondary | ICD-10-CM | POA: Insufficient documentation

## 2022-05-18 DIAGNOSIS — Z7952 Long term (current) use of systemic steroids: Secondary | ICD-10-CM | POA: Insufficient documentation

## 2022-05-18 DIAGNOSIS — Z881 Allergy status to other antibiotic agents status: Secondary | ICD-10-CM | POA: Diagnosis not present

## 2022-05-18 DIAGNOSIS — E538 Deficiency of other specified B group vitamins: Secondary | ICD-10-CM

## 2022-05-18 DIAGNOSIS — Z23 Encounter for immunization: Secondary | ICD-10-CM | POA: Insufficient documentation

## 2022-05-18 DIAGNOSIS — Z9484 Stem cells transplant status: Secondary | ICD-10-CM | POA: Insufficient documentation

## 2022-05-18 DIAGNOSIS — Z886 Allergy status to analgesic agent status: Secondary | ICD-10-CM | POA: Diagnosis not present

## 2022-05-18 DIAGNOSIS — Z5112 Encounter for antineoplastic immunotherapy: Secondary | ICD-10-CM | POA: Insufficient documentation

## 2022-05-18 DIAGNOSIS — Z885 Allergy status to narcotic agent status: Secondary | ICD-10-CM | POA: Diagnosis not present

## 2022-05-18 DIAGNOSIS — Z7961 Long term (current) use of immunomodulator: Secondary | ICD-10-CM | POA: Diagnosis not present

## 2022-05-18 LAB — CBC WITH DIFFERENTIAL (CANCER CENTER ONLY)
Abs Immature Granulocytes: 0.13 10*3/uL — ABNORMAL HIGH (ref 0.00–0.07)
Basophils Absolute: 0 10*3/uL (ref 0.0–0.1)
Basophils Relative: 0 %
Eosinophils Absolute: 0 10*3/uL (ref 0.0–0.5)
Eosinophils Relative: 0 %
HCT: 30.4 % — ABNORMAL LOW (ref 36.0–46.0)
Hemoglobin: 10.4 g/dL — ABNORMAL LOW (ref 12.0–15.0)
Immature Granulocytes: 1 %
Lymphocytes Relative: 21 %
Lymphs Abs: 2.2 10*3/uL (ref 0.7–4.0)
MCH: 30.6 pg (ref 26.0–34.0)
MCHC: 34.2 g/dL (ref 30.0–36.0)
MCV: 89.4 fL (ref 80.0–100.0)
Monocytes Absolute: 0.5 10*3/uL (ref 0.1–1.0)
Monocytes Relative: 5 %
Neutro Abs: 7.7 10*3/uL (ref 1.7–7.7)
Neutrophils Relative %: 73 %
Platelet Count: 292 10*3/uL (ref 150–400)
RBC: 3.4 MIL/uL — ABNORMAL LOW (ref 3.87–5.11)
RDW: 16.6 % — ABNORMAL HIGH (ref 11.5–15.5)
WBC Count: 10.6 10*3/uL — ABNORMAL HIGH (ref 4.0–10.5)
nRBC: 0 % (ref 0.0–0.2)

## 2022-05-18 LAB — CMP (CANCER CENTER ONLY)
ALT: 8 U/L (ref 0–44)
AST: 11 U/L — ABNORMAL LOW (ref 15–41)
Albumin: 3.7 g/dL (ref 3.5–5.0)
Alkaline Phosphatase: 56 U/L (ref 38–126)
Anion gap: 6 (ref 5–15)
BUN: 30 mg/dL — ABNORMAL HIGH (ref 8–23)
CO2: 22 mmol/L (ref 22–32)
Calcium: 8.7 mg/dL — ABNORMAL LOW (ref 8.9–10.3)
Chloride: 106 mmol/L (ref 98–111)
Creatinine: 1.15 mg/dL — ABNORMAL HIGH (ref 0.44–1.00)
GFR, Estimated: 51 mL/min — ABNORMAL LOW (ref >60)
Glucose, Bld: 108 mg/dL — ABNORMAL HIGH (ref 70–99)
Potassium: 3.7 mmol/L (ref 3.5–5.1)
Sodium: 134 mmol/L — ABNORMAL LOW (ref 135–145)
Total Bilirubin: 0.4 mg/dL (ref 0.3–1.2)
Total Protein: 6.4 g/dL — ABNORMAL LOW (ref 6.5–8.1)

## 2022-05-18 MED ORDER — SODIUM CHLORIDE 0.9% FLUSH
10.0000 mL | Freq: Once | INTRAVENOUS | Status: AC | PRN
  Administered 2022-05-18: 10 mL

## 2022-05-18 MED ORDER — NYSTATIN 100000 UNIT/GM EX POWD: 1.0000 | Freq: Two times a day (BID) | CUTANEOUS | 0 refills | Status: DC

## 2022-05-18 MED ORDER — SODIUM CHLORIDE 0.9 % IV SOLN
10.0000 mg | Freq: Once | INTRAVENOUS | Status: AC
  Administered 2022-05-18: 10 mg via INTRAVENOUS
  Filled 2022-05-18: qty 10

## 2022-05-18 MED ORDER — SODIUM CHLORIDE 0.9 % IV SOLN: Freq: Once | INTRAVENOUS | Status: AC

## 2022-05-18 MED ORDER — SODIUM CHLORIDE 0.9% FLUSH
10.0000 mL | INTRAVENOUS | Status: DC | PRN
  Administered 2022-05-18: 10 mL

## 2022-05-18 MED ORDER — DEXAMETHASONE 4 MG PO TABS: 4.0000 mg | ORAL_TABLET | Freq: Every day | ORAL | 0 refills | Status: DC

## 2022-05-18 MED ORDER — DEXTROSE 5 % IV SOLN
34.0000 mg/m2 | Freq: Once | INTRAVENOUS | Status: AC
  Administered 2022-05-18: 50 mg via INTRAVENOUS
  Filled 2022-05-18: qty 15

## 2022-05-18 MED ORDER — HEPARIN SOD (PORK) LOCK FLUSH 100 UNIT/ML IV SOLN
500.0000 [IU] | Freq: Once | INTRAVENOUS | Status: AC | PRN
  Administered 2022-05-18: 500 [IU]

## 2022-05-18 MED ORDER — INFLUENZA VAC A&B SA ADJ QUAD 0.5 ML IM PRSY
0.5000 mL | PREFILLED_SYRINGE | Freq: Once | INTRAMUSCULAR | Status: AC
  Administered 2022-05-18: 0.5 mL via INTRAMUSCULAR
  Filled 2022-05-18: qty 0.5

## 2022-05-18 NOTE — Assessment & Plan Note (Signed)
This is likely due to recent steroid use I recommend nystatin powder

## 2022-05-18 NOTE — Assessment & Plan Note (Addendum)
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  

## 2022-05-18 NOTE — Progress Notes (Signed)
[facility] OFFICE PROGRESS NOTE  Patient Care Team: Josetta Huddle, MD as PCP - General (Internal Medicine) Buford Dresser, MD as PCP - Cardiology (Cardiology) Ginette Pitman, MD as Consulting Physician (Hematology and Oncology) Hessie Dibble, MD as Consulting Physician (Hematology and Oncology)  ASSESSMENT & PLAN:  Multiple myeloma in relapse [facility]) I have reviewed her recent myeloma panel with her She has good response to treatment She will be getting slightly increased dose of carfilzomib at 36 mg per metered square this month I recommend gentle taper of dexamethasone at 4 mg every other day alternate with 2 mg by mouth She will continue acyclovir for antimicrobial prophylaxis I reminded her to get dental visit to get dental clearance before we resume Zometa  We discussed the importance of preventive care and reviewed the vaccination programs. She does not have any prior allergic reactions to influenza vaccination. She agrees to proceed with influenza vaccination today and we will administer it today at the clinic.   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   All questions were answered. The patient knows to call the clinic with any problems, questions or concerns. The total time spent in the appointment was 30 minutes encounter with patients including review of chart and various tests results, discussions about plan of care and coordination of care plan   [name],  MD 05/18/2022 12:29 PM  INTERVAL HISTORY: Please see below for problem oriented charting. she returns for treatment follow-up with her husband She is receiving dexamethasone and carfilzomib for recurrent multiple myeloma She had recent yeast infection under her breast Otherwise, she felt much better with daily dexamethasone at 4 mg She is eating better and has gained some weight  REVIEW OF SYSTEMS:   Constitutional: Denies fevers, chills or abnormal weight loss Eyes: Denies blurriness of vision Ears, nose, mouth, throat, and face: Denies mucositis or sore throat Respiratory: Denies cough, dyspnea or wheezes Cardiovascular: Denies palpitation, chest discomfort or lower extremity swelling Gastrointestinal:  Denies nausea, heartburn or change in bowel habits Skin: Denies abnormal skin rashes Lymphatics: Denies new lymphadenopathy or easy bruising Neurological:Denies numbness, tingling or new weaknesses Behavioral/Psych: Mood is stable, no new changes  All other systems were reviewed with the patient and are negative.  I have reviewed the past medical history, past surgical history, social history and family history with the patient and they are unchanged from previous note.  ALLERGIES:  is allergic to augmentin [amoxicillin-pot clavulanate], albuterol, codeine, ibuprofen, nsaids, tolmetin, and tetanus-diphth-acell pertussis.  MEDICATIONS:  Current Outpatient Medications  Medication Sig Dispense Refill   nystatin (MYCOSTATIN/NYSTOP) powder Apply 1 Application topically 2 (two) times daily. 60 g 0   acetaminophen (TYLENOL) 500 MG tablet Take 500-1,000 mg by mouth every 6 (six) hours as needed for mild pain, moderate pain, fever or headache.     acyclovir (ZOVIRAX) 400 MG tablet Take 1 tablet (400 mg total) by mouth daily. 30 tablet 3   dexamethasone (DECADRON) 4 MG tablet Take 1 tablet (4 mg total) by mouth daily. Take  4 mg by mouth alternate with 2 mg every other day 30 tablet 0    lidocaine-prilocaine (EMLA) cream Apply to affected area once 30 g 3   loratadine (CLARITIN) 10 MG tablet Take 10 mg by mouth daily as needed for allergies.     Multiple Vitamins-Minerals (CENTRUM SILVER PO) Take 1 tablet by mouth daily.      omeprazole (PRILOSEC) 20 MG capsule TAKE 1 CAPSULE(20 MG) BY MOUTH DAILY 90 capsule 11   ondansetron (ZOFRAN) 8 MG tablet Take 1 tablet (8 mg total) by mouth 2 (two) times daily as needed (Nausea or vomiting). 30 tablet 1   prochlorperazine (COMPAZINE) 10 MG tablet Take 1 tablet (10 mg total) by mouth every 6 (six) hours as needed (Nausea or vomiting). 30 tablet 1   senna-docusate (SENOKOT-S) 8.6-50 MG tablet Take 1-2 tablets by mouth daily as needed for mild constipation or moderate constipation.     sodium chloride (OCEAN) 0.65 % SOLN nasal spray Place 1 spray into both nostrils as needed for congestion.     vitamin B-12 (CYANOCOBALAMIN) 500 MCG tablet Take 500 mcg by mouth daily.     No current facility-administered medications for this visit.    SUMMARY OF ONCOLOGIC HISTORY: Oncology History Overview Note   M-protein 0.69 gm/dl IFIX - IgG, Kappa IgG - 868 IgA - 19 IgM - < 20 Kappa - 21 Lambda - 5.7  09/06/2014 - Bone marrow aspirate and biopsy:   Normocellular marrow for age (40%) with a small monoclonal plasma cell population (1% on aspirate). Karyotype 70, XX  FISH Negative for myeloma associated changes  09/12/2014 - PET/CT  Two regions that are concerning for disease, one adjacent/involving the left ninth rib and one in the marrow of the right femur, in this patient with history of plasmacytoma.    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   04/01/2015 Bone Marrow Transplant   She received melphalan chemotherapy on 03/31/2015 followed by autologous stem cell transplant the day after   04/03/2015 - 04/18/2015 Hospital Admission   The patient was admitted to the hospital at [facility] for management related to complication from stem cell transplant. She had significant nausea requiring intravenous anti-emetics.   07/17/2015 -  Chemotherapy   She started maintenance Revlimid and monthly zometa, then every 3 months   06/01/2018 Imaging   DEXA scan showed bone density T score in femur -2.3   04/20/2019 PET scan   1. No FDG  avid osseous lesions or mass identified to suggest metabolically active lesion of myeloma or plasmacytoma. 2. Small nodular density within the paravertebral right lower lobe exhibits mild to moderate increased uptake within SUV max of 3.38. This is indeterminate. Review of CT chest from 10/05/2018 shows a corresponding Lung nodule in this area measuring the same. Small pulmonary neoplasm cannot be excluded. 3. Indeterminate, focal area of increased uptake is identified within the thoracic canal. Indeterminate favored to represent benign physiologic CNS activity.     04/30/2019 - 01/21/2022 Chemotherapy   Patient is on Treatment Plan : MYELOMA SQ Daratumumab Faspro q28d      04/30/2019 - 07/09/2019 Chemotherapy   The patient had bortezomib SQ (VELCADE) chemo injection 1.75 mg, 1.3 mg/m2 = 1.75 mg, Subcutaneous,  Once, 10 of 11 cycles Administration: 1.75 mg (04/30/2019), 1.75 mg (05/07/2019), 1.75 mg (05/14/2019), 1.75 mg (05/21/2019), 1.75 mg (05/28/2019), 1.75 mg (06/04/2019), 1.75 mg (06/11/2019), 1.75 mg (06/18/2019), 1.75 mg (06/25/2019), 1.75 mg (07/09/2019)  for chemotherapy treatment.    03/05/2022 Procedure   Successful placement of a right IJ approach Power Port with ultrasound and fluoroscopic guidance. The catheter is ready for use   03/09/2022 - 04/06/2022 Chemotherapy   Patient is on Treatment Plan : MYELOMA RELAPSED/REFRACTORY Carfilzomib + Dexamethasone (Kd) weekly q28d     03/09/2022 -  Chemotherapy   Patient is on Treatment Plan : MYELOMA RELAPSED/REFRACTORY Carfilzomib (20/70) D1,8,15 + Dexamethasone weekly (40) (Kd) q28d  x 9 cycles / Dexamethasone D1,8,15       PHYSICAL EXAMINATION: ECOG PERFORMANCE STATUS: 1 - Symptomatic but completely ambulatory  Vitals:   05/18/22 1207  BP: (!) 149/83  Pulse: 71  Resp: 18  SpO2: 100%   Filed Weights   05/18/22 1207  Weight: 113 lb 9.6 oz (51.5 kg)    GENERAL:alert, no distress and comfortable SKIN: Noted yeast infection under the crease of both breast NEURO: alert & oriented x 3 with fluent speech, no focal motor/sensory deficits  LABORATORY DATA:  I have reviewed the data as listed    Component Value Date/Time   NA 134 (L) 05/18/2022 1140   NA 143 07/14/2017 1245   K 3.7 05/18/2022 1140   K 3.3 (L) 07/14/2017 1245   CL 106 05/18/2022 1140   CO2 22 05/18/2022 1140   CO2 20 (L) 07/14/2017 1245   GLUCOSE 108 (H) 05/18/2022 1140   GLUCOSE 106 07/14/2017 1245   BUN 30 (H) 05/18/2022 1140   BUN 9.3 07/14/2017 1245   CREATININE 1.15 (H) 05/18/2022 1140   CREATININE 0.8 07/14/2017 1245   CALCIUM 8.7 (L) 05/18/2022 1140   CALCIUM 8.4 07/14/2017 1245   PROT 6.4 (L) 05/18/2022 1140   PROT 6.0  07/14/2017 1245   PROT 6.2 (L) 07/14/2017 1245   ALBUMIN 3.7 05/18/2022 1140   ALBUMIN 3.6 07/14/2017 1245   AST 11 (L) 05/18/2022 1140   AST 21 07/14/2017 1245   ALT 8 05/18/2022 1140   ALT 21 07/14/2017 1245   ALKPHOS 56 05/18/2022 1140   ALKPHOS 78 07/14/2017 1245   BILITOT 0.4 05/18/2022 1140   BILITOT 0.43 07/14/2017 1245   GFRNONAA 51 (L) 05/18/2022 1140   GFRAA 57 (L) 04/21/2020 0817   GFRAA >60 01/14/2020 0828    No results found for: "SPEP", "UPEP"  Lab Results  Component Value Date   WBC 10.6 (H) 05/18/2022   NEUTROABS 7.7 05/18/2022   HGB 10.4 (L) 05/18/2022   HCT 30.4 (L) 05/18/2022   MCV 89.4 05/18/2022  PLT 292 05/18/2022      Chemistry      Component Value Date/Time   NA 134 (L) 05/18/2022 1140   NA 143 07/14/2017 1245   K 3.7 05/18/2022 1140   K 3.3 (L) 07/14/2017 1245   CL 106 05/18/2022 1140   CO2 22 05/18/2022 1140   CO2 20 (L) 07/14/2017 1245   BUN 30 (H) 05/18/2022 1140   BUN 9.3 07/14/2017 1245   CREATININE 1.15 (H) 05/18/2022 1140   CREATININE 0.8 07/14/2017 1245      Component Value Date/Time   CALCIUM 8.7 (L) 05/18/2022 1140   CALCIUM 8.4 07/14/2017 1245   ALKPHOS 56 05/18/2022 1140   ALKPHOS 78 07/14/2017 1245   AST 11 (L) 05/18/2022 1140   AST 21 07/14/2017 1245   ALT 8 05/18/2022 1140   ALT 21 07/14/2017 1245   BILITOT 0.4 05/18/2022 1140   BILITOT 0.43 07/14/2017 1245

## 2022-05-18 NOTE — Patient Instructions (Signed)
[name]  Discharge Instructions: Thank you for choosing [name] to provide your oncology and hematology care.   If you have a lab appointment with the [name], please go directly to the [name] and check in at the registration area.   Wear comfortable clothing and clothing appropriate for easy access to any Portacath or PICC line.   We strive to give you quality time with your provider. You may need to reschedule your appointment if you arrive late (15 or more minutes).  Arriving late affects you and other patients whose appointments are after yours.  Also, if you miss three or more appointments without notifying the office, you may be dismissed from the clinic at the provider's discretion.      For prescription refill requests, have your pharmacy contact our office and allow 72 hours for refills to be completed.    Today you received the following chemotherapy and/or immunotherapy agents: carfilzomib      To help prevent nausea and vomiting after your treatment, we encourage you to take your nausea medication as directed.  BELOW ARE SYMPTOMS THAT SHOULD BE REPORTED IMMEDIATELY: *FEVER GREATER THAN 100.4 F (38 C) OR HIGHER *CHILLS OR SWEATING *NAUSEA AND VOMITING THAT IS NOT CONTROLLED WITH YOUR NAUSEA MEDICATION *UNUSUAL SHORTNESS OF BREATH *UNUSUAL BRUISING OR BLEEDING *URINARY PROBLEMS (pain or burning when urinating, or frequent urination) *BOWEL PROBLEMS (unusual diarrhea, constipation, pain near the anus) TENDERNESS IN MOUTH AND THROAT WITH OR WITHOUT PRESENCE OF ULCERS (sore throat, sores in mouth, or a toothache) UNUSUAL RASH, SWELLING OR PAIN  UNUSUAL VAGINAL DISCHARGE OR ITCHING   Items with * indicate a potential emergency and should be followed up as soon as possible or go to the Emergency Department if any problems should occur.  Please show the CHEMOTHERAPY ALERT CARD or IMMUNOTHERAPY ALERT CARD at check-in  to the Emergency Department and triage nurse.  Should you have questions after your visit or need to cancel or reschedule your appointment, please contact [name]  Dept: [phone]  and follow the prompts.  Office hours are 8:00 a.m. to 4:30 p.m. Monday - Friday. Please note that voicemails left after 4:00 p.m. may not be returned until the following business day.  We are closed weekends and major holidays. You have access to a nurse at all times for urgent questions. Please call the main number to the clinic Dept: [phone] and follow the prompts.   For any non-urgent questions, you may also contact your provider using MyChart. We now offer e-Visits for anyone 18 and older to request care online for non-urgent symptoms. For details visit [email or web address].   Also download the MyChart app! Go to the app store, search "MyChart", open the app, select , and log in with your MyChart username and password.  Masks are optional in the cancer centers. If you would like for your care team to wear a mask while they are taking care of you, please let them know. You may have one support person who is at least 72 years old accompany you for your appointments. 

## 2022-05-18 NOTE — Assessment & Plan Note (Addendum)
I have reviewed her recent myeloma panel with her She has good response to treatment She will be getting slightly increased dose of carfilzomib at 36 mg per metered square this month I recommend gentle taper of dexamethasone at 4 mg every other day alternate with 2 mg by mouth She will continue acyclovir for antimicrobial prophylaxis I reminded her to get dental visit to get dental clearance before we resume Zometa  We discussed the importance of preventive care and reviewed the vaccination programs. She does not have any prior allergic reactions to influenza vaccination. She agrees to proceed with influenza vaccination today and we will administer it today at the clinic.

## 2022-05-18 NOTE — Assessment & Plan Note (Signed)
This symptom has resolved with addition of dexamethasone I recommend gentle taper

## 2022-05-18 NOTE — Assessment & Plan Note (Signed)
Her vitamin B12 level in May was adequate She will be getting vitamin B12 injection every 3 months

## 2022-05-19 ENCOUNTER — Other Ambulatory Visit: Payer: Self-pay

## 2022-05-20 ENCOUNTER — Other Ambulatory Visit: Payer: Self-pay

## 2022-05-24 ENCOUNTER — Other Ambulatory Visit: Payer: Self-pay | Admitting: Hematology and Oncology

## 2022-05-24 DIAGNOSIS — Z9481 Bone marrow transplant status: Secondary | ICD-10-CM

## 2022-05-24 DIAGNOSIS — Z7189 Other specified counseling: Secondary | ICD-10-CM

## 2022-05-24 DIAGNOSIS — C9002 Multiple myeloma in relapse: Secondary | ICD-10-CM

## 2022-05-24 MED FILL — Dexamethasone Sodium Phosphate Inj 100 MG/10ML: INTRAMUSCULAR | Qty: 1 | Status: AC

## 2022-05-25 ENCOUNTER — Telehealth: Payer: Self-pay

## 2022-05-25 ENCOUNTER — Inpatient Hospital Stay: Payer: Medicare Other

## 2022-05-25 NOTE — Telephone Encounter (Addendum)
Returned her call. She is canceling appts today for lab and infusion. Appts canceled. She fell outside on back porch this am after feeling dizzy. She hit her elbows and ankles in the fall. Denies hitting her head. [place] on [place]. Instructed to go to ER to be evaluated. She states that she is not going and does not feel like going out of the house. For worsening symptoms she will go to the ER.

## 2022-05-31 MED FILL — Dexamethasone Sodium Phosphate Inj 100 MG/10ML: INTRAMUSCULAR | Qty: 1 | Status: AC

## 2022-06-01 ENCOUNTER — Inpatient Hospital Stay: Payer: Medicare Other

## 2022-06-01 ENCOUNTER — Telehealth: Payer: Self-pay

## 2022-06-01 ENCOUNTER — Other Ambulatory Visit: Payer: Self-pay

## 2022-06-01 ENCOUNTER — Inpatient Hospital Stay (HOSPITAL_BASED_OUTPATIENT_CLINIC_OR_DEPARTMENT_OTHER): Payer: Medicare Other | Admitting: Physician Assistant

## 2022-06-01 VITALS — BP 142/87 | HR 91 | Temp 98.7°F | Resp 16

## 2022-06-01 DIAGNOSIS — D539 Nutritional anemia, unspecified: Secondary | ICD-10-CM | POA: Diagnosis not present

## 2022-06-01 DIAGNOSIS — C9002 Multiple myeloma in relapse: Secondary | ICD-10-CM | POA: Diagnosis not present

## 2022-06-01 DIAGNOSIS — R112 Nausea with vomiting, unspecified: Secondary | ICD-10-CM | POA: Diagnosis not present

## 2022-06-01 DIAGNOSIS — Z7189 Other specified counseling: Secondary | ICD-10-CM

## 2022-06-01 DIAGNOSIS — Z5112 Encounter for antineoplastic immunotherapy: Secondary | ICD-10-CM | POA: Diagnosis not present

## 2022-06-01 DIAGNOSIS — Z9481 Bone marrow transplant status: Secondary | ICD-10-CM

## 2022-06-01 LAB — CBC WITH DIFFERENTIAL (CANCER CENTER ONLY)
Abs Immature Granulocytes: 0.04 10*3/uL (ref 0.00–0.07)
Basophils Absolute: 0 10*3/uL (ref 0.0–0.1)
Basophils Relative: 0 %
Eosinophils Absolute: 0.1 10*3/uL (ref 0.0–0.5)
Eosinophils Relative: 1 %
HCT: 32.7 % — ABNORMAL LOW (ref 36.0–46.0)
Hemoglobin: 10.9 g/dL — ABNORMAL LOW (ref 12.0–15.0)
Immature Granulocytes: 1 %
Lymphocytes Relative: 41 %
Lymphs Abs: 2.9 10*3/uL (ref 0.7–4.0)
MCH: 30.5 pg (ref 26.0–34.0)
MCHC: 33.3 g/dL (ref 30.0–36.0)
MCV: 91.6 fL (ref 80.0–100.0)
Monocytes Absolute: 0.6 10*3/uL (ref 0.1–1.0)
Monocytes Relative: 8 %
Neutro Abs: 3.4 10*3/uL (ref 1.7–7.7)
Neutrophils Relative %: 49 %
Platelet Count: 287 10*3/uL (ref 150–400)
RBC: 3.57 MIL/uL — ABNORMAL LOW (ref 3.87–5.11)
RDW: 16.1 % — ABNORMAL HIGH (ref 11.5–15.5)
WBC Count: 7.1 10*3/uL (ref 4.0–10.5)
nRBC: 0 % (ref 0.0–0.2)

## 2022-06-01 LAB — CMP (CANCER CENTER ONLY)
ALT: 14 U/L (ref 0–44)
AST: 14 U/L — ABNORMAL LOW (ref 15–41)
Albumin: 3.7 g/dL (ref 3.5–5.0)
Alkaline Phosphatase: 82 U/L (ref 38–126)
Anion gap: 6 (ref 5–15)
BUN: 15 mg/dL (ref 8–23)
CO2: 25 mmol/L (ref 22–32)
Calcium: 8.7 mg/dL — ABNORMAL LOW (ref 8.9–10.3)
Chloride: 105 mmol/L (ref 98–111)
Creatinine: 0.71 mg/dL (ref 0.44–1.00)
GFR, Estimated: >60 mL/min (ref >60)
Glucose, Bld: 82 mg/dL (ref 70–99)
Potassium: 3.7 mmol/L (ref 3.5–5.1)
Sodium: 136 mmol/L (ref 135–145)
Total Bilirubin: 0.7 mg/dL (ref 0.3–1.2)
Total Protein: 6.8 g/dL (ref 6.5–8.1)

## 2022-06-01 MED ORDER — DEXTROSE 5 % IV SOLN
35.0000 mg/m2 | Freq: Once | INTRAVENOUS | Status: AC
  Administered 2022-06-01: 50 mg via INTRAVENOUS
  Filled 2022-06-01: qty 15

## 2022-06-01 MED ORDER — ONDANSETRON HCL 4 MG/2ML IJ SOLN
4.0000 mg | Freq: Once | INTRAMUSCULAR | Status: AC
  Administered 2022-06-01: 4 mg via INTRAVENOUS
  Filled 2022-06-01: qty 2

## 2022-06-01 MED ORDER — SODIUM CHLORIDE 0.9 % IV SOLN
10.0000 mg | Freq: Once | INTRAVENOUS | Status: AC
  Administered 2022-06-01: 10 mg via INTRAVENOUS
  Filled 2022-06-01: qty 10

## 2022-06-01 MED ORDER — SODIUM CHLORIDE 0.9 % IV SOLN: INTRAVENOUS | Status: DC

## 2022-06-01 MED ORDER — SODIUM CHLORIDE 0.9% FLUSH
10.0000 mL | INTRAVENOUS | Status: DC | PRN
  Administered 2022-06-01: 10 mL

## 2022-06-01 MED ORDER — SODIUM CHLORIDE 0.9% FLUSH
10.0000 mL | Freq: Once | INTRAVENOUS | Status: AC
  Administered 2022-06-01: 10 mL

## 2022-06-01 MED ORDER — HEPARIN SOD (PORK) LOCK FLUSH 100 UNIT/ML IV SOLN
500.0000 [IU] | Freq: Once | INTRAVENOUS | Status: AC | PRN
  Administered 2022-06-01: 500 [IU]

## 2022-06-01 NOTE — Progress Notes (Signed)
Symptom Management Consult note [place]    Patient Care Team: Josetta Huddle, MD as PCP - General (Internal Medicine) Buford Dresser, MD as PCP - Cardiology (Cardiology) Ginette Pitman, MD as Consulting Physician (Hematology and Oncology) Hessie Dibble, MD as Consulting Physician (Hematology and Oncology)    Name of the patient: Jessica [name]  [id number]  1950/12/11   Date of visit: 06/01/2022   Chief Complaint/Reason for visit: dehydration   Current Therapy: Kyprolis  Last treatment:  Day 1   Cycle 3 on   ASSESSMENT & PLAN: Patient is a 71 y.o. female  with oncologic history of multiple myeloma in relapse followed by Dr. Alvy Bimler.  I have viewed most recent oncology note and lab work.    #) Multiple Myeloma in relapse -Patient canceled last treatment 05/25/22 because she was feeling dizzy and had fall at home. -Labs within parameter for treatment.Patient is agreeable to have treatment today as scheduled. - Next appointment with oncologist is 06/15/22  #) Nausea - Patient has persistent nausea with multiple anti-emetics at home. She thinks medications mostly control it.  -Patient given liter of IVF and zofran in infusion today. She was tolerating PO intake with saltines and ginger ale. Benign abdominal exam. Doubt acute abdomen. Diarrhea that she had yesterday has resolved spontaneously. -Cmp overall unremarkable, normal kidney function.   #)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 -Managed and followed closely by oncologist. -CBC with hemoglobin 10.9, similar to recent.      Heme/Onc History: Oncology History Overview Note   M-protein 0.69 gm/dl IFIX - IgG, Kappa IgG - 868 IgA - 19 IgM - < 20 Kappa - 21 Lambda - 5.7  09/06/2014 - Bone marrow aspirate and biopsy:   Normocellular marrow for age (40%) with a small monoclonal plasma cell population (1% on aspirate). Karyotype 18, XX  FISH Negative for myeloma associated changes  09/12/2014  - PET/CT  Two regions that are concerning for disease, one adjacent/involving the left ninth rib and one in the marrow of the right femur, in this patient with history of plasmacytoma.    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   04/01/2015 Bone Marrow Transplant   She received melphalan chemotherapy on 03/31/2015 followed by autologous stem cell transplant the day after   04/03/2015 - 04/18/2015 Hospital Admission   The patient was admitted to the hospital at [facility] for management related to complication from stem cell transplant. She had  significant nausea requiring intravenous anti-emetics.   07/17/2015 -  Chemotherapy   She started maintenance Revlimid and monthly zometa, then every 3 months   06/01/2018 Imaging   DEXA scan showed bone density T score in femur -2.3   04/20/2019 PET scan   1. No FDG avid osseous lesions or mass identified to suggest metabolically active lesion of myeloma or plasmacytoma. 2. Small nodular density within the  paravertebral right lower lobe exhibits mild to moderate increased uptake within SUV max of 3.38. This is indeterminate. Review of CT chest from 10/05/2018 shows a corresponding Lung nodule in this area measuring the same. Small pulmonary neoplasm cannot be excluded. 3. Indeterminate, focal area of increased uptake is identified within the thoracic canal. Indeterminate favored to represent benign physiologic CNS activity.     04/30/2019 - 01/21/2022 Chemotherapy   Patient is on Treatment Plan : MYELOMA SQ Daratumumab Faspro q28d     04/30/2019 - 07/09/2019 Chemotherapy   The patient had bortezomib SQ (VELCADE) chemo injection 1.75 mg, 1.3 mg/m2 = 1.75 mg, Subcutaneous,  Once, 10 of 11 cycles Administration: 1.75 mg (04/30/2019), 1.75 mg (05/07/2019), 1.75 mg (05/14/2019), 1.75 mg (05/21/2019), 1.75 mg (05/28/2019), 1.75 mg (06/04/2019), 1.75 mg (06/11/2019), 1.75 mg (06/18/2019), 1.75 mg (06/25/2019), 1.75 mg (07/09/2019)  for chemotherapy treatment.    03/05/2022 Procedure   Successful placement of a right IJ approach Power Port with ultrasound and fluoroscopic guidance. The catheter is ready for use   03/09/2022 - 04/06/2022 Chemotherapy   Patient is on Treatment Plan : MYELOMA RELAPSED/REFRACTORY Carfilzomib + Dexamethasone (Kd) weekly q28d     03/09/2022 -  Chemotherapy   Patient is on Treatment Plan : MYELOMA RELAPSED/REFRACTORY Carfilzomib (20/70) D1,8,15 + Dexamethasone weekly (40) (Kd) q28d  x 9 cycles / Dexamethasone D1,8,15         Interval history-: Jessica [name] is a 71 y.o. female with oncologic history as above seen in the infusion center today with chief complaint of nausea.  Patient tells me she has had symptoms since 2016.  She has multiple medications for her nausea at home including Zofran and Compazine.  She last took Compazine this morning.  She ran out of Zofran however her husband plans to pick it up from the pharmacy today.  Patient has also been taking Decadron as prescribed.   She is unsure if it is helping with her nausea but thinks may be so.  Patient yesterday felt unwell spent most of the afternoon on the commode.  Patient states that she vomited 4 times and had 2 episodes of diarrhea.  She denies seeing blood in either emesis or stool.  She denies any associated abdominal pain.  She has not had any fever.  She admits to dining out frequently however her spouse is asymptomatic so she does not suspect any food poisoning. She has not had any diarrhea today. She only drank 1 bottle of water and had a cup of coffee yesterday so she thinks she might be dehydrated.       ROS  All other systems are reviewed and are negative for acute change except as noted in the HPI.    Allergies  Allergen Reactions   Augmentin [Amoxicillin-Pot Clavulanate] Hives, Itching, Nausea And Vomiting and Rash    ++Tolerates Cefepime++ Has patient had a PCN reaction causing immediate rash, facial/tongue/throat swelling, SOB or lightheadedness with hypotension: Yes Has patient had a PCN reaction causing severe rash involving mucus membranes or skin necrosis: Yes Has patient had a PCN  reaction that required hospitalization No Has patient had a PCN reaction occurring within the last 10 years: Yes If all of the above answers are "NO", then may proceed with Cephalosporin use. *okay to take other types of cillin   Albuterol Other (See Comments)    Elevated HR and BP   Codeine Nausea Only   Ibuprofen Nausea Only   Nsaids Other (See Comments)    Gi Upset   Tolmetin Other (See Comments)    Gi Upset   Tetanus-Diphth-Acell Pertussis Rash    Received injection in ER on 8/1, developed fine rash all over body. Cellulitis to injection site on left deltoid.     Past Medical History:  Diagnosis Date   H/O stem cell transplant [facility]) 03/2015   [facility]   Multiple myeloma [facility]) 11/19/2014   Multiple myeloma ([place])    Multiple myeloma ([facility])    MVP (mitral valve prolapse)    req prophylaxis    Other constipation 12/03/2014   Right ovarian cyst 10/16/15   16 mm simple cyst - ultrasound yearly.   Upper respiratory infection, acute 08/12/2015     Past Surgical History:  Procedure Laterality Date   CATARACT EXTRACTION, BILATERAL  10/2010   Implants(ReSTOR) Bilat   IR IMAGING GUIDED PORT INSERTION  03/04/2022   LASER ABLATION OF CONDYLOMAS  1990   CIN 1 cervix   multiple myeloma surgery Right 08/2014   [place] History   Socioeconomic History   Marital status: Married    Spouse name: Not on file   Number of children: Not on file   Years of education: Not on file   Highest education level: Not on file  Occupational History   Not on file  Tobacco Use   Smoking status: Never   Smokeless tobacco: Never  Vaping Use   Vaping Use: Never used  Substance and Sexual Activity   Alcohol use: No    Alcohol/week: 0.0 standard drinks of alcohol   Drug use: No   Sexual activity: Not Currently    Partners: Male    Birth control/protection: Post-menopausal    Comment: vasectomy  Other Topics Concern   Not on file  Social History Narrative   Not on file   Social Determinants of Health   Financial Resource Strain: Not on file  Food Insecurity: Not on file  Transportation Needs: Not on file  Physical Activity: Not on file  Stress: Not on file  Social Connections: Not on file  Intimate Partner Violence: Not on file    Family History  Problem Relation Age of Onset   Osteoporosis Mother    Diabetes Father      Current Outpatient Medications:    acetaminophen (TYLENOL) 500 MG tablet, Take 500-1,000 mg by mouth every 6 (six) hours as needed for mild pain, moderate pain, fever or headache., Disp: , Rfl:    acyclovir (ZOVIRAX) 400 MG tablet, Take 1 tablet (400 mg total) by mouth daily., Disp: 30 tablet, Rfl: 3   dexamethasone (DECADRON) 4 MG tablet, Take 1 tablet (4 mg total) by mouth daily. Take 4 mg by mouth alternate with 2 mg every other day, Disp: 30  tablet, Rfl: 0   lidocaine-prilocaine (EMLA) cream, Apply to affected area once, Disp: 30 g, Rfl: 3   loratadine (CLARITIN) 10 MG tablet, Take 10 mg by mouth daily as needed for allergies., Disp: , Rfl:    Multiple Vitamins-Minerals (CENTRUM SILVER PO), Take 1 tablet by mouth daily. , Disp: ,  Rfl:    nystatin (MYCOSTATIN/NYSTOP) powder, Apply 1 Application topically 2 (two) times daily., Disp: 60 g, Rfl: 0   omeprazole (PRILOSEC) 20 MG capsule, TAKE 1 CAPSULE(20 MG) BY MOUTH DAILY, Disp: 90 capsule, Rfl: 11   ondansetron (ZOFRAN) 8 MG tablet, TAKE 1 TABLET(8 MG) BY MOUTH TWICE DAILY AS NEEDED FOR NAUSEA OR VOMITING, Disp: 30 tablet, Rfl: 1   prochlorperazine (COMPAZINE) 10 MG tablet, Take 1 tablet (10 mg total) by mouth every 6 (six) hours as needed (Nausea or vomiting)., Disp: 30 tablet, Rfl: 1   senna-docusate (SENOKOT-S) 8.6-50 MG tablet, Take 1-2 tablets by mouth daily as needed for mild constipation or moderate constipation., Disp: , Rfl:    sodium chloride (OCEAN) 0.65 % SOLN nasal spray, Place 1 spray into both nostrils as needed for congestion., Disp: , Rfl:    vitamin B-12 (CYANOCOBALAMIN) 500 MCG tablet, Take 500 mcg by mouth daily., Disp: , Rfl:  No current facility-administered medications for this visit.  Facility-Administered Medications Ordered in Other Visits:    0.9 %  sodium chloride infusion, , Intravenous, Continuous, Walisiewicz, Matthieu Loftus E, PA-C, Stopped at 06/01/22 1621   sodium chloride flush (NS) 0.9 % injection 10 mL, 10 mL, Intracatheter, PRN, Alvy Bimler, Ni, MD, 10 mL at 06/01/22 1629  PHYSICAL EXAM: ECOG FS:1 - Symptomatic but completely ambulatory   T: 98.7   BP: 142/87    HR: 91   Resp: 16   O2: 100% RA Physical Exam Vitals and nursing note reviewed.  Constitutional:      Appearance: She is well-developed. She is not ill-appearing or toxic-appearing.  HENT:     Head: Normocephalic.     Nose: Nose normal.     Mouth/Throat:     Mouth: Mucous membranes are dry.   Eyes:     Conjunctiva/sclera: Conjunctivae normal.  Neck:     Vascular: No JVD.  Cardiovascular:     Rate and Rhythm: Normal rate and regular rhythm.     Pulses: Normal pulses.     Heart sounds: Normal heart sounds.  Pulmonary:     Effort: Pulmonary effort is normal.     Breath sounds: Normal breath sounds.  Abdominal:     General: Bowel sounds are normal. There is no distension.     Palpations: Abdomen is soft. There is no mass.     Tenderness: There is no abdominal tenderness. There is no guarding or rebound.     Hernia: No hernia is present.  Musculoskeletal:     Cervical back: Normal range of motion.     Right lower leg: No edema.     Left lower leg: No edema.  Skin:    General: Skin is warm and dry.  Neurological:     Mental Status: She is oriented to person, place, and time.        LABORATORY DATA: I have reviewed the data as listed    Latest Ref Rng & Units 06/01/2022   12:10 PM 05/18/2022   11:40 AM 05/04/2022   11:58 AM  CBC  WBC 4.0 - 10.5 K/uL 7.1  10.6  7.6   Hemoglobin 12.0 - 15.0 g/dL 10.9  10.4  9.7   Hematocrit 36.0 - 46.0 % 32.7  30.4  28.6   Platelets 150 - 400 K/uL 287  292  382         Latest Ref Rng & Units 06/01/2022   12:10 PM 05/18/2022   11:40 AM 05/04/2022   11:58 AM  CMP  Glucose 70 - 99 mg/dL 82  108  86   BUN 8 - 23 mg/dL 15  30  23    Creatinine 0.44 - 1.00 mg/dL 0.71  1.15  0.96   Sodium 135 - 145 mmol/L 136  134  135   Potassium 3.5 - 5.1 mmol/L 3.7  3.7  3.8   Chloride 98 - 111 mmol/L 105  106  106   CO2 22 - 32 mmol/L 25  22  24    Calcium 8.9 - 10.3 mg/dL 8.7  8.7  8.8   Total Protein 6.5 - 8.1 g/dL 6.8  6.4  6.5   Total Bilirubin 0.3 - 1.2 mg/dL 0.7  0.4  0.5   Alkaline Phos 38 - 126 U/L 82  56  55   AST 15 - 41 U/L 14  11  9    ALT 0 - 44 U/L 14  8  6         RADIOGRAPHIC STUDIES (from last 24 hours if applicable) I have personally reviewed the radiological images as listed and agreed with the findings in the  report. No results found.      Visit Diagnosis: 1. Multiple myeloma in relapse ([place])   2. Nausea and vomiting, unspecified vomiting type   3. Deficiency anemia      No orders of the defined types were placed in this encounter.   All questions were answered. The patient knows to call the clinic with any problems, questions or concerns. No barriers to learning was detected.  I have spent a total of 20 minutes minutes of face-to-face and non-face-to-face time, preparing to see the patient, obtaining and/or reviewing separately obtained history, performing a medically appropriate examination, counseling and educating the patient, ordering tests, documenting clinical information in the electronic health record, and care coordination (communications with other health care professionals or caregivers).    Thank you for allowing me to participate in the care of this patient.    Barrie Folk, PA-C Department of Hematology/Oncology [facility] at [facility] Phone: [phone]  [phone]    06/01/2022 5:03 PM

## 2022-06-01 NOTE — Telephone Encounter (Signed)
Jessica [name], [place], in [facility] will see today in the infusion to evaluate Becky's symptoms.

## 2022-06-01 NOTE — Telephone Encounter (Signed)
I do not have extra time to see her unless someone canceled We can get [facility] to evaluate If her appt is used for IVF instead of chemo, you have to let me know because I need to change her treatment plan and scheduler has to make changes to her plan

## 2022-06-01 NOTE — Telephone Encounter (Signed)
Returned her call. She is not feeling well. She had nausea last night. The nausea is better today. She does not think she will be able to do treatment today. Encouraged her to come in today and if she is not able get treatment she could get IV fluids. She will come into appts today, reminded her of appts.  FYI, do you want to see her today?

## 2022-06-02 LAB — KAPPA/LAMBDA LIGHT CHAINS
Kappa free light chain: 39.2 mg/L — ABNORMAL HIGH (ref 3.3–19.4)
Kappa, lambda light chain ratio: >26.13 — ABNORMAL HIGH (ref 0.26–1.65)
Lambda free light chains: <1.5 mg/L — ABNORMAL LOW (ref 5.7–26.3)

## 2022-06-07 LAB — MULTIPLE MYELOMA PANEL, SERUM
Albumin SerPl Elph-Mcnc: 2.9 g/dL (ref 2.9–4.4)
Albumin/Glob SerPl: 1 (ref 0.7–1.7)
Alpha 1: 0.3 g/dL (ref 0.0–0.4)
Alpha2 Glob SerPl Elph-Mcnc: 1.1 g/dL — ABNORMAL HIGH (ref 0.4–1.0)
B-Globulin SerPl Elph-Mcnc: 1 g/dL (ref 0.7–1.3)
Gamma Glob SerPl Elph-Mcnc: 0.8 g/dL (ref 0.4–1.8)
Globulin, Total: 3.2 g/dL (ref 2.2–3.9)
IgA: 6 mg/dL — ABNORMAL LOW (ref 64–422)
IgG (Immunoglobin G), Serum: 923 mg/dL (ref 586–1602)
IgM (Immunoglobulin M), Srm: <5 mg/dL — ABNORMAL LOW (ref 26–217)
M Protein SerPl Elph-Mcnc: 0.8 g/dL — ABNORMAL HIGH
Total Protein ELP: 6.1 g/dL (ref 6.0–8.5)

## 2022-06-14 MED FILL — Dexamethasone Sodium Phosphate Inj 100 MG/10ML: INTRAMUSCULAR | Qty: 1 | Status: AC

## 2022-06-15 ENCOUNTER — Inpatient Hospital Stay: Payer: Medicare Other

## 2022-06-15 ENCOUNTER — Other Ambulatory Visit: Payer: Self-pay

## 2022-06-15 ENCOUNTER — Encounter: Payer: Self-pay | Admitting: Hematology and Oncology

## 2022-06-15 ENCOUNTER — Inpatient Hospital Stay: Payer: Medicare Other | Attending: Hematology and Oncology | Admitting: Hematology and Oncology

## 2022-06-15 VITALS — BP 141/78 | HR 80 | Resp 18 | Ht 60.0 in | Wt 124.0 lb

## 2022-06-15 DIAGNOSIS — Z880 Allergy status to penicillin: Secondary | ICD-10-CM | POA: Diagnosis not present

## 2022-06-15 DIAGNOSIS — C9002 Multiple myeloma in relapse: Secondary | ICD-10-CM | POA: Diagnosis not present

## 2022-06-15 DIAGNOSIS — Z886 Allergy status to analgesic agent status: Secondary | ICD-10-CM | POA: Diagnosis not present

## 2022-06-15 DIAGNOSIS — Z7189 Other specified counseling: Secondary | ICD-10-CM | POA: Diagnosis not present

## 2022-06-15 DIAGNOSIS — Z881 Allergy status to other antibiotic agents status: Secondary | ICD-10-CM | POA: Diagnosis not present

## 2022-06-15 DIAGNOSIS — Z885 Allergy status to narcotic agent status: Secondary | ICD-10-CM | POA: Diagnosis not present

## 2022-06-15 DIAGNOSIS — Z7969 Long term (current) use of other immunomodulators and immunosuppressants: Secondary | ICD-10-CM | POA: Diagnosis not present

## 2022-06-15 DIAGNOSIS — T380X5A Adverse effect of glucocorticoids and synthetic analogues, initial encounter: Secondary | ICD-10-CM

## 2022-06-15 DIAGNOSIS — G720 Drug-induced myopathy: Secondary | ICD-10-CM | POA: Diagnosis not present

## 2022-06-15 DIAGNOSIS — R635 Abnormal weight gain: Secondary | ICD-10-CM | POA: Diagnosis not present

## 2022-06-15 DIAGNOSIS — Z79899 Other long term (current) drug therapy: Secondary | ICD-10-CM | POA: Diagnosis not present

## 2022-06-15 DIAGNOSIS — Z7952 Long term (current) use of systemic steroids: Secondary | ICD-10-CM | POA: Insufficient documentation

## 2022-06-15 DIAGNOSIS — Z7961 Long term (current) use of immunomodulator: Secondary | ICD-10-CM | POA: Diagnosis not present

## 2022-06-15 DIAGNOSIS — Z9484 Stem cells transplant status: Secondary | ICD-10-CM | POA: Insufficient documentation

## 2022-06-15 DIAGNOSIS — Z79624 Long term (current) use of inhibitors of nucleotide synthesis: Secondary | ICD-10-CM | POA: Insufficient documentation

## 2022-06-15 DIAGNOSIS — R42 Dizziness and giddiness: Secondary | ICD-10-CM

## 2022-06-15 DIAGNOSIS — Z9481 Bone marrow transplant status: Secondary | ICD-10-CM

## 2022-06-15 DIAGNOSIS — Z887 Allergy status to serum and vaccine status: Secondary | ICD-10-CM | POA: Insufficient documentation

## 2022-06-15 DIAGNOSIS — Z5112 Encounter for antineoplastic immunotherapy: Secondary | ICD-10-CM | POA: Diagnosis present

## 2022-06-15 LAB — CMP (CANCER CENTER ONLY)
ALT: 6 U/L (ref 0–44)
AST: 11 U/L — ABNORMAL LOW (ref 15–41)
Albumin: 3.5 g/dL (ref 3.5–5.0)
Alkaline Phosphatase: 60 U/L (ref 38–126)
Anion gap: 7 (ref 5–15)
BUN: 27 mg/dL — ABNORMAL HIGH (ref 8–23)
CO2: 23 mmol/L (ref 22–32)
Calcium: 8.4 mg/dL — ABNORMAL LOW (ref 8.9–10.3)
Chloride: 109 mmol/L (ref 98–111)
Creatinine: 0.89 mg/dL (ref 0.44–1.00)
GFR, Estimated: >60 mL/min (ref >60)
Glucose, Bld: 84 mg/dL (ref 70–99)
Potassium: 3.7 mmol/L (ref 3.5–5.1)
Sodium: 139 mmol/L (ref 135–145)
Total Bilirubin: 0.6 mg/dL (ref 0.3–1.2)
Total Protein: 5.8 g/dL — ABNORMAL LOW (ref 6.5–8.1)

## 2022-06-15 LAB — CBC WITH DIFFERENTIAL (CANCER CENTER ONLY)
Abs Immature Granulocytes: 0.25 10*3/uL — ABNORMAL HIGH (ref 0.00–0.07)
Basophils Absolute: 0 10*3/uL (ref 0.0–0.1)
Basophils Relative: 0 %
Eosinophils Absolute: 0 10*3/uL (ref 0.0–0.5)
Eosinophils Relative: 0 %
HCT: 28.5 % — ABNORMAL LOW (ref 36.0–46.0)
Hemoglobin: 9.2 g/dL — ABNORMAL LOW (ref 12.0–15.0)
Immature Granulocytes: 2 %
Lymphocytes Relative: 24 %
Lymphs Abs: 2.6 10*3/uL (ref 0.7–4.0)
MCH: 30.6 pg (ref 26.0–34.0)
MCHC: 32.3 g/dL (ref 30.0–36.0)
MCV: 94.7 fL (ref 80.0–100.0)
Monocytes Absolute: 0.8 10*3/uL (ref 0.1–1.0)
Monocytes Relative: 7 %
Neutro Abs: 7.3 10*3/uL (ref 1.7–7.7)
Neutrophils Relative %: 67 %
Platelet Count: 310 10*3/uL (ref 150–400)
RBC: 3.01 MIL/uL — ABNORMAL LOW (ref 3.87–5.11)
RDW: 15.8 % — ABNORMAL HIGH (ref 11.5–15.5)
WBC Count: 10.9 10*3/uL — ABNORMAL HIGH (ref 4.0–10.5)
nRBC: 0.2 % (ref 0.0–0.2)

## 2022-06-15 MED ORDER — DEXAMETHASONE 4 MG PO TABS: 4.0000 mg | ORAL_TABLET | Freq: Every day | ORAL | 0 refills | Status: DC

## 2022-06-15 MED ORDER — SODIUM CHLORIDE 0.9% FLUSH
10.0000 mL | Freq: Once | INTRAVENOUS | Status: AC
  Administered 2022-06-15: 10 mL

## 2022-06-15 NOTE — Assessment & Plan Note (Signed)
She tolerated steroid taper very poorly The patient was a little agitated related to side effects of steroids causing cushingoid features While she responded well to chemotherapy, given her decline in performance status, I recommend stopping treatment and focus on supportive care, physical therapy and rehab She will continue dexamethasone for now

## 2022-06-15 NOTE — Assessment & Plan Note (Signed)
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

## 2022-06-15 NOTE — Assessment & Plan Note (Signed)
She has signs of steroid myopathy and significant generalized weakness with deconditioning Unfortunately, whenever we attempt to taper her dexamethasone down, she with felt profound dizziness For now, I recommend increasing her dexamethasone back to 4 mg daily We discussed importance of physical therapy and rehab

## 2022-06-15 NOTE — Progress Notes (Signed)
[place] OFFICE PROGRESS NOTE  Patient Care Team: Josetta Huddle, MD as PCP - General (Internal Medicine) Buford Dresser, MD as PCP - Cardiology (Cardiology) Ginette Pitman, MD as Consulting Physician (Hematology and Oncology) Hessie Dibble, MD as Consulting Physician (Hematology and Oncology)  ASSESSMENT & PLAN:  Multiple myeloma in relapse [facility]) She tolerated steroid taper very poorly The patient was a little agitated related to side effects of steroids causing cushingoid features While she responded well to chemotherapy, given her decline in performance status, I recommend stopping treatment and focus on supportive care, physical therapy and rehab She will continue dexamethasone for now  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 and significant generalized weakness with deconditioning Unfortunately, whenever we attempt to taper her dexamethasone down, she with felt profound dizziness For now, I recommend increasing her dexamethasone back to 4 mg daily We discussed importance of physical therapy and rehab   Postural dizziness She has extensive evaluation both by cardiologist and neurologist in the past We 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 along with mild steroid insufficiency When she is on full dose dexamethasone, her energy level, appetite, and dizziness resolves For now, she will remain on 4 mg dexamethasone indefinitely  Orders Placed This Encounter  Procedures   CBC with Differential/Platelet    Standing Status:   Standing    Number of Occurrences:   22    Standing Expiration Date:   06/16/2023   Comprehensive metabolic panel    Standing Status:   Standing    Number of Occurrences:   33    Standing Expiration Date:   06/16/2023   Kappa/lambda light chains    Standing Status:   Standing    Number of Occurrences:   22    Standing Expiration Date:   06/16/2023   Multiple Myeloma Panel (SPEP&IFE w/QIG)    Standing  Status:   Standing    Number of Occurrences:   22    Standing Expiration Date:   06/16/2023   Ambulatory referral to Physical Therapy    Referral Priority:   Routine    Referral Type:   Physical Medicine    Referral Reason:   Specialty Services Required    Requested Specialty:   Physical Therapy    Number of Visits Requested:   1    All questions were answered. The patient knows to call the clinic with any problems, questions or concerns. The total time spent in the appointment was 40 minutes encounter with patients including review of chart and various tests results, discussions about plan of care and coordination of care plan   [name], MD 06/15/2022 3:16 PM  INTERVAL HISTORY: Please see below for problem oriented charting. she returns for treatment follow-up with her husband Since last time I saw her, she has generalized deconditioning She has improved appetite and has gained weight but she disliked her appearance with moon face from treatment She has been complaining of dizziness since her last visit when I initiated dexamethasone taper She is almost completely bedbound It took her 10 minutes to get to her car today The patient has not moved around much since her last visit She denies recent falls We have extensive discussions about goals of care today  REVIEW OF SYSTEMS:   Constitutional: Denies fevers, chills or abnormal weight loss Eyes: Denies blurriness of vision Ears, nose, mouth, throat, and face: Denies mucositis or sore throat Respiratory: Denies cough, dyspnea or wheezes Cardiovascular: Denies palpitation, chest  discomfort or lower extremity swelling Gastrointestinal:  Denies nausea, heartburn or change in bowel habits Skin: Denies abnormal skin rashes Lymphatics: Denies new lymphadenopathy or easy bruising Behavioral/Psych: Mood is stable, no new changes  All other systems were reviewed with the patient and are negative.  I have reviewed the past medical  history, past surgical history, social history and family history with the patient and they are unchanged from previous note.  ALLERGIES:  is allergic to augmentin [amoxicillin-pot clavulanate], albuterol, codeine, ibuprofen, nsaids, tolmetin, and tetanus-diphth-acell pertussis.  MEDICATIONS:  Current Outpatient Medications  Medication Sig Dispense Refill   acetaminophen (TYLENOL) 500 MG tablet Take 500-1,000 mg by mouth every 6 (six) hours as needed for mild pain, moderate pain, fever or headache.     acyclovir (ZOVIRAX) 400 MG tablet Take 1 tablet (400 mg total) by mouth daily. 30 tablet 3   dexamethasone (DECADRON) 4 MG tablet Take 1 tablet (4 mg total) by mouth daily. 60 tablet 0   lidocaine-prilocaine (EMLA) cream Apply to affected area once 30 g 3   loratadine (CLARITIN) 10 MG tablet Take 10 mg by mouth daily as needed for allergies.     Multiple Vitamins-Minerals (CENTRUM SILVER PO) Take 1 tablet by mouth daily.      nystatin (MYCOSTATIN/NYSTOP) powder Apply 1 Application topically 2 (two) times daily. 60 g 0   omeprazole (PRILOSEC) 20 MG capsule TAKE 1 CAPSULE(20 MG) BY MOUTH DAILY 90 capsule 11   ondansetron (ZOFRAN) 8 MG tablet TAKE 1 TABLET(8 MG) BY MOUTH TWICE DAILY AS NEEDED FOR NAUSEA OR VOMITING 30 tablet 1   prochlorperazine (COMPAZINE) 10 MG tablet Take 1 tablet (10 mg total) by mouth every 6 (six) hours as needed (Nausea or vomiting). 30 tablet 1   senna-docusate (SENOKOT-S) 8.6-50 MG tablet Take 1-2 tablets by mouth daily as needed for mild constipation or moderate constipation.     sodium chloride (OCEAN) 0.65 % SOLN nasal spray Place 1 spray into both nostrils as needed for congestion.     vitamin B-12 (CYANOCOBALAMIN) 500 MCG tablet Take 500 mcg by mouth daily.     No current facility-administered medications for this visit.    SUMMARY OF ONCOLOGIC HISTORY: Oncology History Overview Note   M-protein 0.69 gm/dl IFIX - IgG, Kappa IgG - 868 IgA - 19 IgM - < 20 Kappa - 21  Lambda - 5.7  09/06/2014 - Bone marrow aspirate and biopsy:   Normocellular marrow for age (40%) with a small monoclonal plasma cell population (1% on aspirate). Karyotype 6, XX  FISH Negative for myeloma associated changes  09/12/2014 - PET/CT  Two regions that are concerning for disease, one adjacent/involving the left ninth rib and one in the marrow of the right femur, in this patient with history of plasmacytoma.    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   04/01/2015 Bone Marrow Transplant   She received melphalan chemotherapy on 03/31/2015 followed by autologous stem cell transplant the day after   04/03/2015 - 04/18/2015 Hospital Admission   The patient was admitted to the hospital at [place] for management related to complication from stem cell transplant. She had significant nausea requiring intravenous anti-emetics.   07/17/2015 -  Chemotherapy   She started maintenance Revlimid and monthly zometa, then every 3 months   06/01/2018  Imaging   DEXA scan showed bone density T score in femur -2.3   04/20/2019 PET scan   1. No FDG avid osseous lesions or mass identified to suggest metabolically active lesion of myeloma or plasmacytoma. 2. Small nodular density within the paravertebral right lower lobe exhibits mild to moderate increased uptake within SUV max of 3.38. This is indeterminate. Review of CT chest from 10/05/2018 shows a corresponding Lung nodule in this area measuring the same. Small pulmonary neoplasm cannot be excluded. 3. Indeterminate, focal area of increased uptake is identified within the thoracic canal. Indeterminate favored to represent benign physiologic CNS activity.     04/30/2019 - 01/21/2022 Chemotherapy   Patient is on Treatment Plan : MYELOMA SQ Daratumumab Faspro q28d     04/30/2019 - 07/09/2019 Chemotherapy   The patient had bortezomib SQ (VELCADE) chemo injection 1.75 mg, 1.3 mg/m2 = 1.75 mg, Subcutaneous,  Once, 10 of 11 cycles Administration: 1.75 mg (04/30/2019), 1.75 mg (05/07/2019), 1.75 mg (05/14/2019), 1.75 mg (05/21/2019), 1.75 mg (05/28/2019), 1.75 mg (06/04/2019), 1.75 mg (06/11/2019), 1.75 mg (06/18/2019), 1.75 mg (06/25/2019), 1.75 mg (07/09/2019)  for chemotherapy treatment.    03/05/2022 Procedure   Successful placement of a right IJ approach Power Port with ultrasound and fluoroscopic guidance. The catheter is ready for use   03/09/2022 - 04/06/2022 Chemotherapy   Patient is on Treatment Plan : MYELOMA RELAPSED/REFRACTORY Carfilzomib + Dexamethasone (Kd) weekly q28d     03/09/2022 -  Chemotherapy   Patient is on Treatment Plan : MYELOMA RELAPSED/REFRACTORY Carfilzomib (20/70) D1,8,15 + Dexamethasone weekly (40) (Kd) q28d  x 9 cycles / Dexamethasone D1,8,15       PHYSICAL EXAMINATION: ECOG PERFORMANCE STATUS: 2 - Symptomatic, <50% confined to bed  Vitals:   06/15/22 1201  BP: (!) 141/78  Pulse: 80  Resp: 18  SpO2: 100%   Filed Weights   06/15/22 1201  Weight: 124 lb (56.2 kg)     GENERAL:alert, no distress and comfortable.  She has cushingoid features NEURO: alert & oriented x 3 with fluent speech, no focal motor/sensory deficits  LABORATORY DATA:  I have reviewed the data as listed    Component Value Date/Time   NA 139 06/15/2022 1124   NA 143 07/14/2017 1245   K 3.7 06/15/2022 1124   K 3.3 (L) 07/14/2017 1245   CL 109 06/15/2022 1124   CO2 23 06/15/2022 1124   CO2 20 (L) 07/14/2017 1245   GLUCOSE 84 06/15/2022 1124   GLUCOSE 106 07/14/2017 1245   BUN 27 (H) 06/15/2022 1124   BUN 9.3 07/14/2017 1245   CREATININE 0.89 06/15/2022 1124   CREATININE 0.8 07/14/2017 1245   CALCIUM 8.4 (  L) 06/15/2022 1124   CALCIUM 8.4 07/14/2017 1245   PROT 5.8 (L) 06/15/2022 1124   PROT 6.0 07/14/2017 1245   PROT 6.2 (L) 07/14/2017 1245   ALBUMIN 3.5 06/15/2022 1124   ALBUMIN 3.6 07/14/2017 1245   AST 11 (L) 06/15/2022 1124   AST 21 07/14/2017 1245   ALT 6 06/15/2022 1124   ALT 21 07/14/2017 1245   ALKPHOS 60 06/15/2022 1124   ALKPHOS 78 07/14/2017 1245   BILITOT 0.6 06/15/2022 1124   BILITOT 0.43 07/14/2017 1245   GFRNONAA >60 06/15/2022 1124   GFRAA 57 (L) 04/21/2020 0817   GFRAA >60 01/14/2020 0828    No results found for: "SPEP", "UPEP"  Lab Results  Component Value Date   WBC 10.9 (H) 06/15/2022   NEUTROABS 7.3 06/15/2022   HGB 9.2 (L) 06/15/2022   HCT 28.5 (L) 06/15/2022   MCV 94.7 06/15/2022   PLT 310 06/15/2022      Chemistry      Component Value Date/Time   NA 139 06/15/2022 1124   NA 143 07/14/2017 1245   K 3.7 06/15/2022 1124   K 3.3 (L) 07/14/2017 1245   CL 109 06/15/2022 1124   CO2 23 06/15/2022 1124   CO2 20 (L) 07/14/2017 1245   BUN 27 (H) 06/15/2022 1124   BUN 9.3 07/14/2017 1245   CREATININE 0.89 06/15/2022 1124   CREATININE 0.8 07/14/2017 1245      Component Value Date/Time   CALCIUM 8.4 (L) 06/15/2022 1124   CALCIUM 8.4 07/14/2017 1245   ALKPHOS 60 06/15/2022 1124   ALKPHOS 78 07/14/2017 1245   AST 11 (L) 06/15/2022  1124   AST 21 07/14/2017 1245   ALT 6 06/15/2022 1124   ALT 21 07/14/2017 1245   BILITOT 0.6 06/15/2022 1124   BILITOT 0.43 07/14/2017 1245

## 2022-06-17 ENCOUNTER — Other Ambulatory Visit: Payer: Self-pay

## 2022-06-22 ENCOUNTER — Ambulatory Visit: Payer: Medicare Other

## 2022-06-22 ENCOUNTER — Other Ambulatory Visit: Payer: Medicare Other

## 2022-06-29 ENCOUNTER — Other Ambulatory Visit: Payer: Medicare Other

## 2022-06-29 ENCOUNTER — Ambulatory Visit: Payer: Medicare Other

## 2022-07-03 NOTE — Therapy (Signed)
OUTPATIENT PHYSICAL THERAPY ONCOLOGY EVALUATION  Patient Name: Jessica [name] MRN: [id number] DOB:1950-08-11, 71 y.o., female Today's Date: 07/05/2022  END OF SESSION:  PT End of Session - 07/05/22 1155     Visit Number 1    Number of Visits 12    Date for PT Re-Evaluation 08/16/22    PT Start Time 1100    PT Stop Time 1153    PT Time Calculation (min) 53 min    Activity Tolerance Patient tolerated treatment well    Behavior During Therapy [facility] for tasks assessed/performed             Past Medical History:  Diagnosis Date   H/O stem cell transplant ([place]) 03/2015   [facility]   Multiple myeloma ([place]) 11/19/2014   Multiple myeloma (HCC)    Multiple myeloma (HCC)    MVP (mitral valve prolapse)    req prophylaxis   Other constipation 12/03/2014   Right ovarian cyst 10/16/15   16 mm simple cyst - ultrasound yearly.   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   Yeast infection of the skin 05/18/2022   Drug-induced neutropenia (HCC) 06/23/2021   Acute back pain 05/25/2021   Recurrent falls 05/25/2021   Drug-induced skin rash 03/11/2021   Fall against sharp object, initial encounter 03/09/2021   Deficiency anemia 02/24/2021   Vitamin B12 deficiency 01/27/2021   Nausea with vomiting 12/31/2020   Physical debility 10/06/2020   Irritation of right eye 09/08/2020   Immunocompromised state due to drug therapy ([place]) 05/19/2020   Preventive measure 04/21/2020   Alopecia 08/21/2019   Hypomagnesemia 04/25/2019   Right sided temporal headache 04/13/2019   Goals of care, counseling/discussion 04/13/2019    Hypocalcemia 01/11/2019   Osteopenia 06/05/2018   Weight loss 04/14/2018   Elevated blood pressure reading in office without diagnosis of hypertension 01/12/2018   Diarrhea 10/13/2017   Postural dizziness 10/13/2017   Right ovarian cyst 05/13/2017   Chronic GERD 01/13/2017   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 08/10/2016   Hypokalemia 07/16/2016   Pneumonia 07/13/2016   Atypical chest pain 07/13/2016   Chest pain on breathing 07/12/2016   Mucositis due to antineoplastic therapy 07/09/2016   Candidal esophagitis ([place]) 04/30/2016   Herpes simplex 04/30/2016   Chronic kidney disease, stage III (moderate) ([place]) 03/12/2016   Hypokalemia, gastrointestinal losses 01/16/2016   Pancytopenia, acquired ([place]) 10/20/2015   Nasal congestion 10/20/2015   Varicose vein of leg 08/25/2015   S/P bone marrow transplant ([place]) 04/22/2015   Drug-induced hypotension 04/22/2015   Petechiae 02/14/2015   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   Essential hypertension 02/07/2015   Infection of eyelid 01/16/2015   Protein calorie malnutrition ([place]) 01/16/2015   Superficial thrombophlebitis of upper extremity 12/24/2014   Sty, external 12/24/2014   Other constipation 12/03/2014   Dehydration 12/02/2014   Weakness 12/02/2014   Multiple myeloma in relapse ([place]) 11/19/2014   Prerenal renal failure 11/19/2014   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[place]) 11/19/2014   Neuropathic pain of left flank 11/19/2014    PCP: Josetta Huddle MD  REFERRING PROVIDER: [name] MD  REFERRING  DIAG: Multiple Myeloma with relapse, generalized weakness  THERAPY DIAG:  Multiple myeloma in relapse (HCC)  Steroid-induced myopathy  Muscle weakness (generalized)  ONSET DATE: 1 year but worse over the last several months  Rationale for Evaluation and Treatment: Rehabilitation  SUBJECTIVE:                                                                                                                                                                                            SUBJECTIVE STATEMENT: Pt presents with her husband Elba.She is dizzy and fights nausea, and is off balance. She was taken off the chemo and put on Dexamethasone. She  has trouble getting up and down from chairs, and not able to do housework. She has fallen 5-6 times in the last year and went down on the steps on Saturday. She has trouble with vision since her craniectomy in 2016. Her legs will just give way on her. She can get in and out of bed herself but with difficulty. She requires assistance with dressing and showering. She does not sleep well because she gets up every 30 min to an hour to go the bathroom. Her heart has been checked, she has seen a neurologist and he thinks chemo has caused a lot of the dizziness.    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 Started chemotherapy 11/26/2014 on Dexamethasone, Velcade and Revlimid. On 04/01/2015 had autologous stem cel ltransplant and admitted to hospital for complications 0/62/3762 to 04/18/2015. Started maintenance Revlimid and monthly Zometa. PET scan in 2020:No FDG avid osseous lesions or mass identified to suggest metabolically active lesion of myeloma or plasmacytoma.  04/30/2019 to 07/09/2019 Velcade injections 10/11 cycles. 8/1/2023Patient is on Treatment Plan : MYELOMA RELAPSED/REFRACTORY Carfilzomib (20/70) D1,8,15 + Dexamethasone weekly (40) (Kd) q28d  x 9 cycles / Dexamethasone D1,8,15 She has signs of steroid myopathy and significant generalized weakness with deconditioning. She did not do well with steroid taper.Whenever they attempt to taper her dexamethasone down, she is left with profound dizziness. She has been started back on 4 mg daily dexamethazone. They are stopping treatment and focusing on supportive care, PT and  rehab. She is bothered by Cushingoid features due to side effects from steroids.    PAIN:  Are you having pain? No, but gets intermittent cramps in her legs, feet stay numb  PRECAUTIONS: FALL RISK< DIZZINESS,USE GAIT BELT,Multiple Myeloma relapsing with generalized weakness/deconditioning  WEIGHT BEARING RESTRICTIONS: No  FALLS:  Has patient fallen in last 6 months? Yes. Number of falls 5 times  LIVING ENVIRONMENT: Lives with: lives with their spouse Lives in: House/apartment Stairs: Yes; Internal: 13 steps; on left going up and External: 2 steps; none Has following equipment at home: None  OCCUPATION: no  LEISURE: Watches [employer]  HAND DOMINANCE: right   PRIOR LEVEL OF FUNCTION: Needs assistance with ADLs  PATIENT GOALS: decrease falls. Improve strength   OBJECTIVE:  COGNITION: Overall cognitive status: Within functional limits for tasks assessed   PALPATION: NA  OBSERVATIONS / OTHER ASSESSMENTS: Husband has to help pull her from the chair, ambulates with wide BOS, lurching gait  SENSATION: Light touch: Deficits numbness in feet especially with laying down   POSTURE: forward head, rounded shoulders  UPPER EXTREMITY AROM/PROM:  A/PROM RIGHT   eval   Shoulder extension   Shoulder flexion 132  Shoulder abduction 137  Shoulder internal rotation   Shoulder external rotation     (Blank rows = not tested)  A/PROM LEFT   eval  Shoulder extension   Shoulder flexion 140  Shoulder abduction 128  Shoulder internal rotation   Shoulder external rotation     (Blank rows = not tested)  CERVICAL AROM: NT due to dizziness    UPPER EXTREMITY STRENGTH: WFL   LOWER EXTREMITY AROM/PROM:  MMT Right eval  Hip flexion 3-  Hip extension   Hip abduction 4 sit  Hip adduction 4 sit  Hip internal rotation 4  Hip external rotation 4+  Knee flexion 3+  Knee extension 4  Ankle dorsiflexion 4  Ankle plantarflexion   Ankle inversion   Ankle eversion    (Blank rows  = not tested)  MMT LEFT eval  Hip flexion 3  Hip extension   Hip abduction 4 sit  Hip adduction 4 sit  Hip internal rotation 4  Hip external rotation 3+  Knee flexion 3+  Knee extension 3+  Ankle dorsiflexion 4+  Ankle plantarflexion   Ankle inversion   Ankle eversion    (Blank rows = not tested)  LYMPHEDEMA ASSESSMENTS:   SURGERY TYPE/DATE: 08/21/2014 Orbital craniectomy due to plasma cytoma, bone marrow transplant, 04/01/15 STEM cell transplant  NUMBER OF LYMPH NODES REMOVED: NA  CHEMOTHERAPY: Yes see History  RADIATION:YES  see history  HORMONE TREATMENT: NO  INFECTIONS: NO   FUNCTIONAL TESTS:  Sit to stand; able to do independently from firm chair with excessive use of arms and SBA of PT 4 position balance test; next to counter; feet together 10 sec with waving of UE's, half tandem maintained 6-8 seconds bilaterally with waving of Ue's, Tandem stance less than 10 seconds bilaterally. Did not attempt SLS  GAIT: Distance walked: 18 ft Assistive device utilized: None, but holding onto husband Level of assistance: CGA Comments: decreased heel strike toe off, wide BOS, lurching type gait   QUICK DASH SURVEY: NA   TODAY'S TREATMENT:  DATE: Educated in bilateral hip flexion and LAQ to start doing at home and gave illustrated and written instructions. Discussed use of gait belt with husband, and walker for safety in the home due to numerous falls.  PATIENT EDUCATION:  Education details: Access Code: [email or web address] URL: [email or web address] Date: 07/05/2022 Prepared by: Cheral Almas  Exercises - Seated March  - 2-3 x daily - 7 x weekly - 1 sets - 10 reps - Seated Long Arc Quad  - 2-3 x daily - 7 x weekly - 1 sets - 10 reps Person educated: Patient and Spouse Education method: Explanation, Demonstration, and  Handouts Education comprehension: verbalized understanding and returned demonstration  HOME EXERCISE PROGRAM: Sitting hip flexion, sitting LAQ  ASSESSMENT:  CLINICAL IMPRESSION: Patient is a 71 y.o. female who was seen today for physical therapy evaluation and treatment for generalized weakness/ deconditioning following a relapse with Multiple Myeloma and steroid myopathy. Treatment is now focusing on supportive care  due to a decline in performance status.. Pt is dependent on assistance of husband for sit to stand, dressing lower body and bathing especially the lower body. She sits most of the day because she gets very dizzy and has difficulty lying down because of it. She gained more confidence as we did things today. She will benefit from skilled PT to address strength and balance deficits, gait, and functional activities to improve functionality in the home.  OBJECTIVE IMPAIRMENTS: Abnormal gait, decreased activity tolerance, decreased balance, difficulty walking, decreased strength, dizziness, impaired sensation, impaired vision/preception, and postural dysfunction.   ACTIVITY LIMITATIONS: carrying, lifting, standing, squatting, sleeping, transfers, bathing, toileting, dressing, reach over head, hygiene/grooming, and locomotion level  PARTICIPATION LIMITATIONS: meal prep, cleaning, laundry, driving, shopping, and yard work  PERSONAL FACTORS: 1-2 comorbidities: multiple Myeloma with with steroid myopathy, dizziness  are also affecting patient's functional outcome.   REHAB POTENTIAL: Good  CLINICAL DECISION MAKING: Evolving/moderate complexity  EVALUATION COMPLEXITY: Moderate  GOALS: Goals reviewed with patient? Yes    SHORT TERM GOALS =LONG TERM GOALS: Target date: 08/16/2021  Pt will be independent and compliant with HEP to improve strength and function. Baseline:  Goal status: INITIAL  2.  Pt will be able to rise from chairs with good control and minimal use of  hands Baseline: max use of arms Goal status: INITIAL  3.  Pt will be able to maintain half tandem stance bilateral x 10 seconds or more without LOB Baseline: unable Goal status: INITIAL  4.  Pt will be able to walk with improved heel toe gait pattern and normal BOS with CGA to S of PT x 25 ft Baseline: wide base of support Goal status: INITIAL  5.  Pt will be able to go up and down 1- 2 steps for improved ease getting into car. Baseline: unable Goal status: INITIAL   PLAN:  PT FREQUENCY: 2x/week  PT DURATION: 6 weeks  PLANNED INTERVENTIONS: Therapeutic exercises, Therapeutic activity, Neuromuscular re-education, Balance training, Gait training, Patient/Family education, Self Care, Stair training, and Re-evaluation  PLAN FOR NEXT SESSION: FALL RISK; USE GAIT BELT. Pt gets very dizzy;does not like to lie down. NU step,Sit to stand or mini squats to table, work in parallel bars for standing strength, balance work, update HEP (pt may find it too hard to get here because of difficulty getting into large SUV and walking to car;may be appropriate for HHPT but willing to try here and see how she does.   Claris Pong, PT 07/05/2022, 11:56 AM

## 2022-07-05 ENCOUNTER — Ambulatory Visit: Payer: Medicare Other | Attending: Hematology and Oncology

## 2022-07-05 DIAGNOSIS — M6281 Muscle weakness (generalized): Secondary | ICD-10-CM | POA: Diagnosis present

## 2022-07-05 DIAGNOSIS — T380X5A Adverse effect of glucocorticoids and synthetic analogues, initial encounter: Secondary | ICD-10-CM | POA: Diagnosis present

## 2022-07-05 DIAGNOSIS — G720 Drug-induced myopathy: Secondary | ICD-10-CM | POA: Insufficient documentation

## 2022-07-05 DIAGNOSIS — R2681 Unsteadiness on feet: Secondary | ICD-10-CM | POA: Diagnosis present

## 2022-07-05 DIAGNOSIS — C9002 Multiple myeloma in relapse: Secondary | ICD-10-CM | POA: Diagnosis not present

## 2022-07-06 ENCOUNTER — Other Ambulatory Visit: Payer: Self-pay

## 2022-07-06 ENCOUNTER — Telehealth: Payer: Self-pay

## 2022-07-06 NOTE — Telephone Encounter (Signed)
Returned call the [name] and Jessica [name]. Jessica had PT eval and did well. Requesting Rx for wheeled walker with diagnosis code written on Rx. [name] will pick up on Thursday at the office.

## 2022-07-07 ENCOUNTER — Ambulatory Visit: Payer: Medicare Other

## 2022-07-07 DIAGNOSIS — T380X5A Adverse effect of glucocorticoids and synthetic analogues, initial encounter: Secondary | ICD-10-CM

## 2022-07-07 DIAGNOSIS — C9002 Multiple myeloma in relapse: Secondary | ICD-10-CM | POA: Diagnosis not present

## 2022-07-07 DIAGNOSIS — R2681 Unsteadiness on feet: Secondary | ICD-10-CM

## 2022-07-07 DIAGNOSIS — M6281 Muscle weakness (generalized): Secondary | ICD-10-CM

## 2022-07-07 DIAGNOSIS — G720 Drug-induced myopathy: Secondary | ICD-10-CM

## 2022-07-07 NOTE — Therapy (Signed)
OUTPATIENT PHYSICAL THERAPY ONCOLOGY TREATMENT  Patient Name: Jessica [name] MRN: [id number] DOB:Sep 09, 1950, 71 y.o., female Today's Date: 07/07/2022  END OF SESSION:  PT End of Session - 07/07/22 0953     Visit Number 2    Number of Visits 12    Date for PT Re-Evaluation 08/16/22    PT Start Time 0956    PT Stop Time 1045    PT Time Calculation (min) 49 min    Equipment Utilized During Treatment Gait belt    Activity Tolerance Patient tolerated treatment well    Behavior During Therapy [facility] for tasks assessed/performed             Past Medical History:  Diagnosis Date   H/O stem cell transplant ([place]) 03/2015   [facility]   Multiple myeloma ([place]) 11/19/2014   Multiple myeloma (HCC)    Multiple myeloma (HCC)    MVP (mitral valve prolapse)    req prophylaxis   Other constipation 12/03/2014   Right ovarian cyst 10/16/15   16 mm simple cyst - ultrasound yearly.   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   Yeast infection of the skin 05/18/2022   Drug-induced neutropenia (HCC) 06/23/2021   Acute back pain 05/25/2021   Recurrent falls 05/25/2021   Drug-induced skin rash 03/11/2021   Fall against sharp object, initial encounter 03/09/2021   Deficiency anemia 02/24/2021   Vitamin B12 deficiency 01/27/2021   Nausea with vomiting 12/31/2020   Physical debility 10/06/2020   Irritation of right eye 09/08/2020   Immunocompromised state due to drug therapy ([place]) 05/19/2020   Preventive measure 04/21/2020   Alopecia 08/21/2019   Hypomagnesemia 04/25/2019   Right sided temporal headache 04/13/2019   Goals of  care, counseling/discussion 04/13/2019   Hypocalcemia 01/11/2019   Osteopenia 06/05/2018   Weight loss 04/14/2018   Elevated blood pressure reading in office without diagnosis of hypertension 01/12/2018   Diarrhea 10/13/2017   Postural dizziness 10/13/2017   Right ovarian cyst 05/13/2017   Chronic GERD 01/13/2017   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 08/10/2016   Hypokalemia 07/16/2016   Pneumonia 07/13/2016   Atypical chest pain 07/13/2016   Chest pain on breathing 07/12/2016   Mucositis due to antineoplastic therapy 07/09/2016   Candidal esophagitis ([place]) 04/30/2016   Herpes simplex 04/30/2016   Chronic kidney disease, stage III (moderate) ([place]) 03/12/2016   Hypokalemia, gastrointestinal losses 01/16/2016   Pancytopenia, acquired ([place]) 10/20/2015   Nasal congestion 10/20/2015   Varicose vein of leg 08/25/2015   S/P bone marrow transplant ([place]) 04/22/2015   Drug-induced hypotension 04/22/2015   Petechiae 02/14/2015   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   Essential hypertension 02/07/2015   Infection of eyelid 01/16/2015   Protein calorie malnutrition ([place]) 01/16/2015   Superficial thrombophlebitis of upper extremity 12/24/2014   Sty, external 12/24/2014   Other constipation 12/03/2014   Dehydration 12/02/2014   Weakness 12/02/2014   Multiple myeloma in relapse ([place]) 11/19/2014   Prerenal renal failure 11/19/2014   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[place]) 11/19/2014   Neuropathic pain of left flank 11/19/2014    PCP: Josetta Huddle  MD  REFERRING PROVIDER: [name] MD  REFERRING DIAG: Multiple Myeloma with relapse, generalized weakness  THERAPY DIAG:  Multiple myeloma in relapse (HCC)  Steroid-induced myopathy  Muscle weakness (generalized)  Unsteadiness on feet  ONSET DATE: 1 year but worse over the last several months  Rationale for Evaluation and Treatment: Rehabilitation  SUBJECTIVE:                                                                                                                                                                                            SUBJECTIVE STATEMENT: I did some of the exercises at home. They did pretty well. I was sore Monday especially in my arms and a little in my legs.  I am really dizzy today and my eyes are bad.  today. Patrick Jupiter got me a shower chair, a gait belt and a walker.   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 Started chemotherapy 11/26/2014 on Dexamethasone, Velcade and Revlimid. On 04/01/2015 had autologous stem cel ltransplant and admitted to hospital for complications 0/04/3817 to 04/18/2015. Started maintenance Revlimid and monthly Zometa. PET scan in 2020:No FDG avid osseous lesions or mass identified to suggest metabolically active lesion of myeloma or plasmacytoma.  04/30/2019 to 07/09/2019 Velcade injections 10/11 cycles. 8/1/2023Patient is on Treatment Plan : MYELOMA RELAPSED/REFRACTORY Carfilzomib (20/70) D1,8,15 + Dexamethasone weekly (40) (Kd) q28d  x 9 cycles / Dexamethasone D1,8,15 She has signs of steroid myopathy and significant generalized weakness with deconditioning. She did not do well with steroid taper.Whenever they attempt to taper her dexamethasone down, she is left with profound dizziness. She has been started back on 4 mg daily dexamethazone. They are stopping treatment and focusing on supportive care, PT and rehab. She is bothered by Cushingoid features due to side effects from steroids.    PAIN:  Are you having pain? No, but gets intermittent cramps in her legs, feet stay numb  PRECAUTIONS: FALL RISK< DIZZINESS,USE GAIT BELT,Multiple Myeloma relapsing with generalized weakness/deconditioning  WEIGHT BEARING RESTRICTIONS: No  FALLS:  Has patient fallen in last 6 months? Yes. Number of falls 5  times  LIVING ENVIRONMENT: Lives with: lives with their spouse Lives in: House/apartment Stairs: Yes; Internal: 13 steps; on left going up and External: 2 steps; none Has following equipment at home: None  OCCUPATION: no  LEISURE: Watches [employer]  HAND DOMINANCE: right   PRIOR LEVEL OF FUNCTION: Needs assistance with ADLs  PATIENT GOALS: decrease falls. Improve strength   OBJECTIVE:  COGNITION: Overall cognitive status: Within functional limits for  tasks assessed   PALPATION: NA  OBSERVATIONS / OTHER ASSESSMENTS: Husband has to help pull her from the chair, ambulates with wide BOS, lurching gait  SENSATION: Light touch: Deficits numbness in feet especially with laying down   POSTURE: forward head, rounded shoulders  UPPER EXTREMITY AROM/PROM:  A/PROM RIGHT   eval   Shoulder extension   Shoulder flexion 132  Shoulder abduction 137  Shoulder internal rotation   Shoulder external rotation     (Blank rows = not tested)  A/PROM LEFT   eval  Shoulder extension   Shoulder flexion 140  Shoulder abduction 128  Shoulder internal rotation   Shoulder external rotation     (Blank rows = not tested)  CERVICAL AROM: NT due to dizziness    UPPER EXTREMITY STRENGTH: WFL   LOWER EXTREMITY AROM/PROM:  MMT Right eval  Hip flexion 3-  Hip extension   Hip abduction 4 sit  Hip adduction 4 sit  Hip internal rotation 4  Hip external rotation 4+  Knee flexion 3+  Knee extension 4  Ankle dorsiflexion 4  Ankle plantarflexion   Ankle inversion   Ankle eversion    (Blank rows = not tested)  MMT LEFT eval  Hip flexion 3  Hip extension   Hip abduction 4 sit  Hip adduction 4 sit  Hip internal rotation 4  Hip external rotation 3+  Knee flexion 3+  Knee extension 3+  Ankle dorsiflexion 4+  Ankle plantarflexion   Ankle inversion   Ankle eversion    (Blank rows = not tested)  LYMPHEDEMA ASSESSMENTS:   SURGERY TYPE/DATE: 08/21/2014 Orbital craniectomy due to  plasma cytoma, bone marrow transplant, 04/01/15 STEM cell transplant  NUMBER OF LYMPH NODES REMOVED: NA  CHEMOTHERAPY: Yes see History  RADIATION:YES  see history  HORMONE TREATMENT: NO  INFECTIONS: NO   FUNCTIONAL TESTS:  Sit to stand; able to do independently from firm chair with excessive use of arms and SBA of PT 4 position balance test; next to counter; feet together 10 sec with waving of UE's, half tandem maintained 6-8 seconds bilaterally with waving of Ue's, Tandem stance less than 10 seconds bilaterally. Did not attempt SLS  GAIT: Distance walked: 18 ft Assistive device utilized: None, but holding onto husband Level of assistance: CGA Comments: decreased heel strike toe off, wide BOS, lurching type gait   QUICK DASH SURVEY: NA   TODAY'S TREATMENT:    07/07/2022 Standing exs in parallel bars with HH,and ambulation between activities done with gait belt and CGA due to significant dizziness Sitting hip flexion 1 x 10 each, LAQ 1 x 10 ea, Sitting marching x 10, alternating LAQ x 10 Sitting heel and toe raises 2 x10 NU Step seat 6, UE 8, level 1 x 3:30, 95 steps Standing heel raises in parallel bars x 10 Standing 3 way hip except extension x 10. Ambulated back to mat with CGA of PT on belt Practiced reaching for 4 cones;low right to high left x 2 ea , then high right to low left x 2 ea Sit to stand from mat with light use of Ue's x 5, CG of therapist but without assist Pts husband walked her to car with gait belt with therapist observing, and pts husband placed a stool for her to step up on. Pt was able to get into car on first attempt.  DATE: Educated in bilateral hip flexion and LAQ to start doing at home and gave illustrated and written instructions. Discussed use of gait belt with husband, and walker for safety in the home due to  numerous falls.  PATIENT EDUCATION:  Education details: Access Code: [email or web address] URL: [email or web address] Date: 07/05/2022 Prepared by: Cheral Almas  Exercises - Seated March  - 2-3 x daily - 7 x weekly - 1 sets - 10 reps - Seated Long Arc Quad  - 2-3 x daily - 7 x weekly - 1 sets - 10 reps Person educated: Patient and Spouse Education method: Explanation, Demonstration, and Handouts Education comprehension: verbalized understanding and returned demonstration  HOME EXERCISE PROGRAM: Sitting hip flexion, sitting LAQ  ASSESSMENT:  CLINICAL IMPRESSION: Pt did exceptionally well today and surprised herself how well she could do. She is able to rise from chairs or mat table with light use of hands. Husband was instructed to let her do the work, and not try to pull her up, but to stand and hold the belt since  she does get very dizzy and she is fearful. She required intermittent rest between most activities to allow dizziness to subside but did very well. Pt was also able to get in the car on her first attempt using a step stool and the car overhead handle.  OBJECTIVE IMPAIRMENTS: Abnormal gait, decreased activity tolerance, decreased balance, difficulty walking, decreased strength, dizziness, impaired sensation, impaired vision/preception, and postural dysfunction.   ACTIVITY LIMITATIONS: carrying, lifting, standing, squatting, sleeping, transfers, bathing, toileting, dressing, reach over head, hygiene/grooming, and locomotion level  PARTICIPATION LIMITATIONS: meal prep, cleaning, laundry, driving, shopping, and yard work  PERSONAL FACTORS: 1-2 comorbidities: multiple Myeloma with with steroid myopathy, dizziness  are also affecting patient's functional outcome.   REHAB POTENTIAL: Good  CLINICAL DECISION MAKING: Evolving/moderate complexity  EVALUATION COMPLEXITY: Moderate  GOALS: Goals reviewed with patient? Yes    SHORT TERM GOALS =LONG TERM GOALS: Target date:  08/16/2021  Pt will be independent and compliant with HEP to improve strength and function. Baseline:  Goal status: INITIAL  2.  Pt will be able to rise from chairs with good control and minimal use of hands Baseline: max use of arms Goal status: INITIAL  3.  Pt will be able to maintain half tandem stance bilateral x 10 seconds or more without LOB Baseline: unable Goal status: INITIAL  4.  Pt will be able to walk with improved heel toe gait pattern and normal BOS with CGA to S of PT x 25 ft Baseline: wide base of support Goal status: INITIAL  5.  Pt will be able to go up and down 1- 2 steps for improved ease getting into car. Baseline: unable Goal status: INITIAL   PLAN:  PT FREQUENCY: 2x/week  PT DURATION: 6 weeks  PLANNED INTERVENTIONS: Therapeutic exercises, Therapeutic activity, Neuromuscular re-education, Balance training, Gait training, Patient/Family education, Self Care, Stair training, and Re-evaluation  PLAN FOR NEXT SESSION: FALL RISK; USE GAIT BELT. Pt gets very dizzy;does not like to lie down., ball squeeze, NU step,Sit to stand or mini squats to table, work in parallel bars for standing strength, balance work, update HEP (pt may find it too hard to get here because of difficulty getting into large SUV and walking to car;may be appropriate for HHPT but willing to try here and see how she does.   Claris Pong, PT 07/07/2022, 10:59 AM

## 2022-07-08 ENCOUNTER — Encounter: Payer: Self-pay | Admitting: Hematology and Oncology

## 2022-07-08 NOTE — Telephone Encounter (Signed)
Called and told Rx for walker ready for pick up. Left at the front desk with receptionist for pick up.

## 2022-07-13 ENCOUNTER — Inpatient Hospital Stay: Payer: Medicare Other | Admitting: Hematology and Oncology

## 2022-07-13 ENCOUNTER — Encounter: Payer: Self-pay | Admitting: Hematology and Oncology

## 2022-07-13 ENCOUNTER — Inpatient Hospital Stay: Payer: Medicare Other | Attending: Hematology and Oncology

## 2022-07-13 ENCOUNTER — Other Ambulatory Visit: Payer: Self-pay

## 2022-07-13 VITALS — BP 174/82 | HR 62 | Temp 97.6°F | Resp 18 | Ht 60.0 in | Wt 130.4 lb

## 2022-07-13 DIAGNOSIS — T380X5A Adverse effect of glucocorticoids and synthetic analogues, initial encounter: Secondary | ICD-10-CM

## 2022-07-13 DIAGNOSIS — Z79899 Other long term (current) drug therapy: Secondary | ICD-10-CM | POA: Insufficient documentation

## 2022-07-13 DIAGNOSIS — Z7961 Long term (current) use of immunomodulator: Secondary | ICD-10-CM | POA: Diagnosis not present

## 2022-07-13 DIAGNOSIS — Z885 Allergy status to narcotic agent status: Secondary | ICD-10-CM | POA: Diagnosis not present

## 2022-07-13 DIAGNOSIS — Z881 Allergy status to other antibiotic agents status: Secondary | ICD-10-CM | POA: Diagnosis not present

## 2022-07-13 DIAGNOSIS — Z9481 Bone marrow transplant status: Secondary | ICD-10-CM

## 2022-07-13 DIAGNOSIS — Z886 Allergy status to analgesic agent status: Secondary | ICD-10-CM | POA: Diagnosis not present

## 2022-07-13 DIAGNOSIS — D61818 Other pancytopenia: Secondary | ICD-10-CM | POA: Diagnosis not present

## 2022-07-13 DIAGNOSIS — Z880 Allergy status to penicillin: Secondary | ICD-10-CM | POA: Insufficient documentation

## 2022-07-13 DIAGNOSIS — Z7952 Long term (current) use of systemic steroids: Secondary | ICD-10-CM | POA: Diagnosis not present

## 2022-07-13 DIAGNOSIS — Z887 Allergy status to serum and vaccine status: Secondary | ICD-10-CM | POA: Insufficient documentation

## 2022-07-13 DIAGNOSIS — C9002 Multiple myeloma in relapse: Secondary | ICD-10-CM | POA: Insufficient documentation

## 2022-07-13 DIAGNOSIS — Z9484 Stem cells transplant status: Secondary | ICD-10-CM | POA: Insufficient documentation

## 2022-07-13 DIAGNOSIS — R42 Dizziness and giddiness: Secondary | ICD-10-CM | POA: Insufficient documentation

## 2022-07-13 DIAGNOSIS — G720 Drug-induced myopathy: Secondary | ICD-10-CM | POA: Insufficient documentation

## 2022-07-13 DIAGNOSIS — D638 Anemia in other chronic diseases classified elsewhere: Secondary | ICD-10-CM | POA: Insufficient documentation

## 2022-07-13 DIAGNOSIS — Z5112 Encounter for antineoplastic immunotherapy: Secondary | ICD-10-CM | POA: Diagnosis present

## 2022-07-13 LAB — COMPREHENSIVE METABOLIC PANEL
ALT: 9 U/L (ref 0–44)
AST: 10 U/L — ABNORMAL LOW (ref 15–41)
Albumin: 3.7 g/dL (ref 3.5–5.0)
Alkaline Phosphatase: 49 U/L (ref 38–126)
Anion gap: 5 (ref 5–15)
BUN: 25 mg/dL — ABNORMAL HIGH (ref 8–23)
CO2: 28 mmol/L (ref 22–32)
Calcium: 9.2 mg/dL (ref 8.9–10.3)
Chloride: 106 mmol/L (ref 98–111)
Creatinine, Ser: 0.88 mg/dL (ref 0.44–1.00)
GFR, Estimated: >60 mL/min (ref >60)
Glucose, Bld: 72 mg/dL (ref 70–99)
Potassium: 3.9 mmol/L (ref 3.5–5.1)
Sodium: 139 mmol/L (ref 135–145)
Total Bilirubin: 0.5 mg/dL (ref 0.3–1.2)
Total Protein: 6.3 g/dL — ABNORMAL LOW (ref 6.5–8.1)

## 2022-07-13 LAB — CBC WITH DIFFERENTIAL/PLATELET
Abs Immature Granulocytes: 0.25 10*3/uL — ABNORMAL HIGH (ref 0.00–0.07)
Basophils Absolute: 0.1 10*3/uL (ref 0.0–0.1)
Basophils Relative: 1 %
Eosinophils Absolute: 0.1 10*3/uL (ref 0.0–0.5)
Eosinophils Relative: 1 %
HCT: 30 % — ABNORMAL LOW (ref 36.0–46.0)
Hemoglobin: 9.8 g/dL — ABNORMAL LOW (ref 12.0–15.0)
Immature Granulocytes: 3 %
Lymphocytes Relative: 28 %
Lymphs Abs: 2 10*3/uL (ref 0.7–4.0)
MCH: 29.8 pg (ref 26.0–34.0)
MCHC: 32.7 g/dL (ref 30.0–36.0)
MCV: 91.2 fL (ref 80.0–100.0)
Monocytes Absolute: 0.6 10*3/uL (ref 0.1–1.0)
Monocytes Relative: 8 %
Neutro Abs: 4.4 10*3/uL (ref 1.7–7.7)
Neutrophils Relative %: 59 %
Platelets: 301 10*3/uL (ref 150–400)
RBC: 3.29 MIL/uL — ABNORMAL LOW (ref 3.87–5.11)
RDW: 13.9 % (ref 11.5–15.5)
WBC: 7.3 10*3/uL (ref 4.0–10.5)
nRBC: 0 % (ref 0.0–0.2)

## 2022-07-13 MED ORDER — HEPARIN SOD (PORK) LOCK FLUSH 100 UNIT/ML IV SOLN
500.0000 [IU] | Freq: Once | INTRAVENOUS | Status: AC
  Administered 2022-07-13: 500 [IU]

## 2022-07-13 MED ORDER — SODIUM CHLORIDE 0.9% FLUSH
10.0000 mL | Freq: Once | INTRAVENOUS | Status: AC
  Administered 2022-07-13: 10 mL

## 2022-07-13 MED ORDER — DEXAMETHASONE 4 MG PO TABS: ORAL_TABLET | ORAL | 0 refills | Status: DC

## 2022-07-13 NOTE — Assessment & Plan Note (Signed)
Treatment has to be stopped recently due to progressive weakness For now, she will continue physical therapy and rehab I plan to prescribe gentle steroid taper I plan to see her again next month for further follow-up

## 2022-07-13 NOTE — Assessment & Plan Note (Signed)
She has extensive evaluation both by cardiologist and neurologist in the past We 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 along with mild steroid insufficiency When she is on full dose dexamethasone, her energy level, appetite, and dizziness resolves I recommend mild gentle steroid taper

## 2022-07-13 NOTE — Progress Notes (Signed)
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   All questions were answered. The patient knows to call the clinic with any problems, questions or concerns. The total time spent in the appointment was 20 minutes encounter with patients including review of chart and various tests results, discussions about plan of care and coordination of care plan   [name], MD 07/13/2022 11:31 AM  INTERVAL HISTORY: Please see below for problem oriented charting. she returns for treatment follow-up with her husband I have reviewed documentation from physical therapy and rehab She is doing a bit better She denies recent falls She has gained some weight  REVIEW OF SYSTEMS:   Constitutional: Denies fevers, chills or abnormal weight  loss Eyes: Denies blurriness of vision Ears, nose, mouth, throat, and face: Denies mucositis or sore throat Respiratory: Denies cough, dyspnea or wheezes Cardiovascular: Denies palpitation, chest discomfort or lower extremity swelling Gastrointestinal:  Denies nausea, heartburn or change in bowel habits Skin: Denies abnormal skin rashes Lymphatics: Denies new lymphadenopathy or easy bruising Behavioral/Psych: Mood is stable, no new changes  All other systems were reviewed with the patient and are negative.  I have reviewed the past medical history, past surgical history, social history and family history with the patient and they are unchanged from previous note.  ALLERGIES:  is allergic to augmentin [amoxicillin-pot clavulanate], albuterol, codeine, ibuprofen, nsaids, tolmetin, and tetanus-diphth-acell pertussis.  MEDICATIONS:  Current Outpatient Medications  Medication Sig Dispense Refill   calcium carbonate (TUMS - DOSED IN MG ELEMENTAL CALCIUM) 500 MG chewable tablet Chew 1 tablet by mouth 2 (two) times daily.     cholecalciferol (VITAMIN D3) 25 MCG (1000 UNIT) tablet Take 2,000 Units by mouth daily.     acetaminophen (TYLENOL) 500 MG tablet Take 500-1,000 mg by mouth every 6 (six) hours as needed for mild pain, moderate pain, fever or headache.     acyclovir (ZOVIRAX) 400 MG tablet Take 1 tablet (400 mg total) by mouth daily. 30 tablet 3   dexamethasone (DECADRON) 4 MG tablet Take 1 tablet alternate with 1/2 tablet every other day 60 tablet 0   lidocaine-prilocaine (EMLA) cream Apply to affected area once 30 g 3   loratadine (CLARITIN) 10 MG tablet Take 10 mg by mouth daily as needed for allergies.     Multiple Vitamins-Minerals (CENTRUM SILVER PO) Take 1 tablet by mouth daily.      nystatin (MYCOSTATIN/NYSTOP) powder Apply 1  Application topically 2 (two) times daily. 60 g 0   omeprazole (PRILOSEC) 20 MG capsule TAKE 1 CAPSULE(20 MG) BY MOUTH DAILY 90 capsule 11   ondansetron  (ZOFRAN) 8 MG tablet TAKE 1 TABLET(8 MG) BY MOUTH TWICE DAILY AS NEEDED FOR NAUSEA OR VOMITING 30 tablet 1   prochlorperazine (COMPAZINE) 10 MG tablet Take 1 tablet (10 mg total) by mouth every 6 (six) hours as needed (Nausea or vomiting). 30 tablet 1   senna-docusate (SENOKOT-S) 8.6-50 MG tablet Take 1-2 tablets by mouth daily as needed for mild constipation or moderate constipation.     sodium chloride (OCEAN) 0.65 % SOLN nasal spray Place 1 spray into both nostrils as needed for congestion.     vitamin B-12 (CYANOCOBALAMIN) 500 MCG tablet Take 500 mcg by mouth daily.     No current facility-administered medications for this visit.    SUMMARY OF ONCOLOGIC HISTORY: Oncology History Overview Note   M-protein 0.69 gm/dl IFIX - IgG, Kappa IgG - 868 IgA - 19 IgM - < 20 Kappa - 21 Lambda - 5.7  09/06/2014 - Bone marrow aspirate and biopsy:   Normocellular marrow for age (40%) with a small monoclonal plasma cell population (1% on aspirate). Karyotype 43, XX  FISH Negative for myeloma associated changes  09/12/2014 - PET/CT  Two regions that are concerning for disease, one adjacent/involving the left ninth rib and one in the marrow of the right femur, in this patient with history of plasmacytoma.    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   04/01/2015 Bone Marrow Transplant   She received melphalan chemotherapy on 03/31/2015 followed by autologous stem cell transplant the day after   04/03/2015 - 04/18/2015 Hospital Admission   The patient was admitted to the hospital at [place] for management related to complication from stem cell transplant. She had significant nausea requiring intravenous anti-emetics.   07/17/2015 -  Chemotherapy   She started maintenance Revlimid and monthly zometa, then every 3 months   06/01/2018 Imaging   DEXA scan showed bone density T score in femur -2.3   04/20/2019 PET scan   1. No FDG avid osseous lesions or mass identified to suggest metabolically active lesion of myeloma or plasmacytoma. 2. Small nodular density within the paravertebral right lower lobe exhibits mild to moderate increased uptake within SUV max of 3.38. This is indeterminate. Review of CT chest from 10/05/2018 shows a corresponding Lung nodule in this area measuring the same.  Small pulmonary neoplasm cannot be excluded. 3. Indeterminate, focal area of increased uptake is identified within the thoracic canal. Indeterminate favored to represent benign physiologic CNS activity.     04/30/2019 - 01/21/2022 Chemotherapy   Patient is on Treatment Plan : MYELOMA SQ Daratumumab Faspro q28d     04/30/2019 - 07/09/2019 Chemotherapy   The patient had bortezomib SQ (VELCADE) chemo injection 1.75 mg, 1.3 mg/m2 = 1.75 mg, Subcutaneous,  Once, 10 of 11 cycles Administration: 1.75 mg (04/30/2019), 1.75 mg (05/07/2019), 1.75 mg (05/14/2019), 1.75 mg (05/21/2019), 1.75 mg (05/28/2019), 1.75 mg (06/04/2019), 1.75 mg (06/11/2019), 1.75 mg (06/18/2019), 1.75 mg  (06/25/2019), 1.75 mg (07/09/2019)  for chemotherapy treatment.    03/05/2022 Procedure   Successful placement of a right IJ approach Power Port with ultrasound and fluoroscopic guidance. The catheter is ready for use   03/09/2022 - 04/06/2022 Chemotherapy   Patient is on Treatment Plan : MYELOMA RELAPSED/REFRACTORY Carfilzomib + Dexamethasone (Kd) weekly q28d     03/09/2022 -  Chemotherapy   Patient is on Treatment Plan : MYELOMA RELAPSED/REFRACTORY Carfilzomib (20/70) D1,8,15 + Dexamethasone weekly (40) (Kd) q28d  x 9 cycles / Dexamethasone D1,8,15       PHYSICAL EXAMINATION: ECOG PERFORMANCE STATUS: 1 - Symptomatic but completely ambulatory  Vitals:   07/13/22 1100  BP: (!) 174/82  Pulse: 62  Resp: 18  Temp: 97.6 F (36.4 C)  SpO2: 100%   Filed Weights   07/13/22 1100  Weight: 130 lb 6.4 oz (59.1 kg)    GENERAL:alert, no distress and comfortable.  She appears cushingoid  NEURO: alert & oriented x 3 with fluent speech, no focal motor/sensory deficits  LABORATORY DATA:  I have reviewed the data as listed    Component Value Date/Time   NA 139 07/13/2022 1038   NA 143 07/14/2017 1245   K 3.9 07/13/2022 1038   K 3.3 (L) 07/14/2017 1245   CL 106 07/13/2022 1038   CO2 28 07/13/2022 1038   CO2 20 (L) 07/14/2017 1245   GLUCOSE 72 07/13/2022 1038   GLUCOSE 106 07/14/2017 1245   BUN 25 (H) 07/13/2022 1038   BUN 9.3 07/14/2017 1245   CREATININE 0.88 07/13/2022 1038   CREATININE 0.89 06/15/2022 1124   CREATININE 0.8 07/14/2017 1245   CALCIUM 9.2 07/13/2022 1038   CALCIUM 8.4 07/14/2017 1245   PROT 6.3 (L) 07/13/2022 1038   PROT 6.0 07/14/2017 1245   PROT 6.2 (L) 07/14/2017 1245   ALBUMIN 3.7 07/13/2022 1038   ALBUMIN 3.6 07/14/2017 1245   AST 10 (L) 07/13/2022 1038   AST 11 (L) 06/15/2022 1124   AST 21 07/14/2017 1245   ALT 9 07/13/2022 1038   ALT 6 06/15/2022 1124   ALT 21 07/14/2017 1245   ALKPHOS 49 07/13/2022 1038   ALKPHOS 78 07/14/2017 1245   BILITOT 0.5  07/13/2022 1038   BILITOT 0.6 06/15/2022 1124   BILITOT 0.43 07/14/2017 1245   GFRNONAA >60 07/13/2022 1038   GFRNONAA >60 06/15/2022 1124   GFRAA 57 (L) 04/21/2020 0817   GFRAA >60 01/14/2020 0828    No results found for: "SPEP", "UPEP"  Lab Results  Component Value Date   WBC 7.3 07/13/2022   NEUTROABS 4.4 07/13/2022   HGB 9.8 (L) 07/13/2022   HCT 30.0 (L) 07/13/2022   MCV 91.2 07/13/2022   PLT 301 07/13/2022      Chemistry      Component Value Date/Time   NA 139 07/13/2022 1038   NA 143 07/14/2017 1245  K 3.9 07/13/2022 1038   K 3.3 (L) 07/14/2017 1245   CL 106 07/13/2022 1038   CO2 28 07/13/2022 1038   CO2 20 (L) 07/14/2017 1245   BUN 25 (H) 07/13/2022 1038   BUN 9.3 07/14/2017 1245   CREATININE 0.88 07/13/2022 1038   CREATININE 0.89 06/15/2022 1124   CREATININE 0.8 07/14/2017 1245      Component Value Date/Time   CALCIUM 9.2 07/13/2022 1038   CALCIUM 8.4 07/14/2017 1245   ALKPHOS 49 07/13/2022 1038   ALKPHOS 78 07/14/2017 1245   AST 10 (L) 07/13/2022 1038   AST 11 (L) 06/15/2022 1124   AST 21 07/14/2017 1245   ALT 9 07/13/2022 1038   ALT 6 06/15/2022 1124   ALT 21 07/14/2017 1245   BILITOT 0.5 07/13/2022 1038   BILITOT 0.6 06/15/2022 1124   BILITOT 0.43 07/14/2017 1245

## 2022-07-13 NOTE — Assessment & Plan Note (Signed)
She is gradually improving with physical therapy and rehab She will continue the same

## 2022-07-13 NOTE — Assessment & Plan Note (Signed)
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

## 2022-07-14 ENCOUNTER — Ambulatory Visit: Payer: Medicare Other

## 2022-07-14 ENCOUNTER — Other Ambulatory Visit: Payer: Self-pay

## 2022-07-14 LAB — KAPPA/LAMBDA LIGHT CHAINS
Kappa free light chain: 30.2 mg/L — ABNORMAL HIGH (ref 3.3–19.4)
Kappa, lambda light chain ratio: 17.76 — ABNORMAL HIGH (ref 0.26–1.65)
Lambda free light chains: 1.7 mg/L — ABNORMAL LOW (ref 5.7–26.3)

## 2022-07-16 ENCOUNTER — Other Ambulatory Visit: Payer: Self-pay

## 2022-07-19 ENCOUNTER — Ambulatory Visit: Payer: Medicare Other | Attending: Hematology and Oncology

## 2022-07-19 DIAGNOSIS — C9002 Multiple myeloma in relapse: Secondary | ICD-10-CM

## 2022-07-19 DIAGNOSIS — T380X5A Adverse effect of glucocorticoids and synthetic analogues, initial encounter: Secondary | ICD-10-CM

## 2022-07-19 DIAGNOSIS — G720 Drug-induced myopathy: Secondary | ICD-10-CM | POA: Insufficient documentation

## 2022-07-19 DIAGNOSIS — R2681 Unsteadiness on feet: Secondary | ICD-10-CM | POA: Diagnosis present

## 2022-07-19 DIAGNOSIS — M6281 Muscle weakness (generalized): Secondary | ICD-10-CM | POA: Diagnosis present

## 2022-07-19 NOTE — Therapy (Addendum)
OUTPATIENT PHYSICAL THERAPY ONCOLOGY TREATMENT  Patient Name: Jessica [name] MRN: [id number] DOB:1951/03/25, 71 y.o., female Today's Date: 07/19/2022  END OF SESSION:  PT End of Session - 07/19/22 1202     Visit Number 3    Number of Visits 12    Date for PT Re-Evaluation 08/16/22    PT Start Time 1202    PT Stop Time 1250    PT Time Calculation (min) 48 min    Equipment Utilized During Treatment Gait belt    Activity Tolerance Patient limited by fatigue;Other (comment)   SOB   Behavior During Therapy [facility] for tasks assessed/performed             Past Medical History:  Diagnosis Date   H/O stem cell transplant [facility]) 03/2015   [facility]   Multiple myeloma [facility]) 11/19/2014   Multiple myeloma ([place])    Multiple myeloma (HCC)    MVP (mitral valve prolapse)    req prophylaxis   Other constipation 12/03/2014   Right ovarian cyst 10/16/15   16 mm simple cyst - ultrasound yearly.   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   Yeast infection of the skin 05/18/2022   Drug-induced neutropenia (HCC) 06/23/2021   Acute back pain 05/25/2021   Recurrent falls 05/25/2021   Drug-induced skin rash 03/11/2021   Fall against sharp object, initial encounter 03/09/2021   Deficiency anemia 02/24/2021   Vitamin B12 deficiency 01/27/2021   Nausea with vomiting 12/31/2020   Physical debility 10/06/2020   Irritation of right eye 09/08/2020   Immunocompromised state due to drug therapy ([place]) 05/19/2020   Preventive measure 04/21/2020   Alopecia 08/21/2019   Hypomagnesemia 04/25/2019   Right sided temporal headache  04/13/2019   Goals of care, counseling/discussion 04/13/2019   Hypocalcemia 01/11/2019   Osteopenia 06/05/2018   Weight loss 04/14/2018   Elevated blood pressure reading in office without diagnosis of hypertension 01/12/2018   Diarrhea 10/13/2017   Postural dizziness 10/13/2017   Right ovarian cyst 05/13/2017   Chronic GERD 01/13/2017   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 08/10/2016   Hypokalemia 07/16/2016   Pneumonia 07/13/2016   Atypical chest pain 07/13/2016   Chest pain on breathing 07/12/2016   Mucositis due to antineoplastic therapy 07/09/2016   Candidal esophagitis ([place]) 04/30/2016   Herpes simplex 04/30/2016   Chronic kidney disease, stage III (moderate) ([place]) 03/12/2016   Hypokalemia, gastrointestinal losses 01/16/2016   Pancytopenia, acquired ([place]) 10/20/2015   Nasal congestion 10/20/2015   Varicose vein of leg 08/25/2015   S/P bone marrow transplant ([place]) 04/22/2015   Drug-induced hypotension 04/22/2015   Petechiae 02/14/2015   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   Essential hypertension 02/07/2015   Infection of eyelid 01/16/2015   Protein calorie malnutrition ([place]) 01/16/2015   Superficial thrombophlebitis of upper extremity 12/24/2014   Sty, external 12/24/2014   Other constipation 12/03/2014   Dehydration 12/02/2014   Weakness 12/02/2014   Multiple myeloma in relapse ([place]) 11/19/2014   Prerenal renal failure 11/19/2014   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[place]) 11/19/2014   Neuropathic pain of left flank 11/19/2014  PCP: Josetta Huddle MD  REFERRING PROVIDER: [name] MD  REFERRING DIAG: Multiple Myeloma with relapse, generalized weakness  THERAPY DIAG:  Multiple myeloma in relapse (HCC)  Steroid-induced myopathy  Muscle weakness (generalized)  Unsteadiness on feet  ONSET DATE: 1 year but worse over the last several months  Rationale for Evaluation and Treatment: Rehabilitation  SUBJECTIVE:                                                                                                                                                                                            SUBJECTIVE STATEMENT: I have had some  come and go headaches and I have told my doctor about it. I have a little LBP today I think its from trying to push my chair in. I have not done many exercises at home, and I have not tried my walker yet. 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 Started chemotherapy 11/26/2014 on Dexamethasone, Velcade and Revlimid. On 04/01/2015 had autologous stem cel ltransplant and admitted to hospital for complications 1/61/0960 to 04/18/2015. Started maintenance Revlimid and monthly Zometa. PET scan in 2020:No FDG avid osseous lesions or mass identified to suggest metabolically active lesion of myeloma or plasmacytoma.  04/30/2019 to 07/09/2019 Velcade injections 10/11 cycles. 8/1/2023Patient is on Treatment Plan : MYELOMA RELAPSED/REFRACTORY Carfilzomib (20/70) D1,8,15 + Dexamethasone weekly (40) (Kd) q28d  x 9 cycles / Dexamethasone D1,8,15 She has signs of steroid myopathy and significant generalized weakness with deconditioning. She did not do well with steroid taper.Whenever they attempt to taper her dexamethasone down, she is left with profound dizziness. She has been started back on 4 mg daily dexamethazone. They are stopping treatment and focusing on supportive care, PT and rehab. She is bothered by Cushingoid features due to side effects from steroids.    PAIN:  Are you having pain? No, but gets intermittent cramps in her legs, feet stay numb  PRECAUTIONS: FALL RISK< DIZZINESS,USE GAIT BELT,Multiple Myeloma relapsing with generalized weakness/deconditioning  WEIGHT BEARING RESTRICTIONS: No  FALLS:  Has patient fallen in last 6 months? Yes.  Number of falls 5 times  LIVING ENVIRONMENT: Lives with: lives with their spouse Lives in: House/apartment Stairs: Yes; Internal: 13 steps; on left going up and External: 2 steps; none Has following equipment at home: None  OCCUPATION: no  LEISURE: Watches [employer]  HAND DOMINANCE: right   PRIOR LEVEL OF FUNCTION: Needs assistance with ADLs  PATIENT GOALS: decrease falls. Improve strength   OBJECTIVE:  COGNITION: Overall cognitive status: Within  functional limits for tasks assessed   PALPATION: NA  OBSERVATIONS / OTHER ASSESSMENTS: Husband has to help pull her from the chair, ambulates with wide BOS, lurching gait  SENSATION: Light touch: Deficits numbness in feet especially with laying down   POSTURE: forward head, rounded shoulders  UPPER EXTREMITY AROM/PROM:  A/PROM RIGHT   eval   Shoulder extension   Shoulder flexion 132  Shoulder abduction 137  Shoulder internal rotation   Shoulder external rotation     (Blank rows = not tested)  A/PROM LEFT   eval  Shoulder extension   Shoulder flexion 140  Shoulder abduction 128  Shoulder internal rotation   Shoulder external rotation     (Blank rows = not tested)  CERVICAL AROM: NT due to dizziness    UPPER EXTREMITY STRENGTH: WFL   LOWER EXTREMITY AROM/PROM:  MMT Right eval  Hip flexion 3-  Hip extension   Hip abduction 4 sit  Hip adduction 4 sit  Hip internal rotation 4  Hip external rotation 4+  Knee flexion 3+  Knee extension 4  Ankle dorsiflexion 4  Ankle plantarflexion   Ankle inversion   Ankle eversion    (Blank rows = not tested)  MMT LEFT eval  Hip flexion 3  Hip extension   Hip abduction 4 sit  Hip adduction 4 sit  Hip internal rotation 4  Hip external rotation 3+  Knee flexion 3+  Knee extension 3+  Ankle dorsiflexion 4+  Ankle plantarflexion   Ankle inversion   Ankle eversion    (Blank rows = not tested)  LYMPHEDEMA ASSESSMENTS:   SURGERY TYPE/DATE: 08/21/2014 Orbital  craniectomy due to plasma cytoma, bone marrow transplant, 04/01/15 STEM cell transplant  NUMBER OF LYMPH NODES REMOVED: NA  CHEMOTHERAPY: Yes see History  RADIATION:YES  see history  HORMONE TREATMENT: NO  INFECTIONS: NO   FUNCTIONAL TESTS:  Sit to stand; able to do independently from firm chair with excessive use of arms and SBA of PT 4 position balance test; next to counter; feet together 10 sec with waving of UE's, half tandem maintained 6-8 seconds bilaterally with waving of Ue's, Tandem stance less than 10 seconds bilaterally. Did not attempt SLS  GAIT: Distance walked: 18 ft Assistive device utilized: None, but holding onto husband Level of assistance: CGA Comments: decreased heel strike toe off, wide BOS, lurching type gait   QUICK DASH SURVEY: NA   TODAY'S TREATMENT:  O2 sats 98 Nu Step 3 min Level 1, with 4 rest periods due to SOB, 63 steps Standing exs in parallel bars with HH,and ambulation between activities done with gait belt and CGA due to significant dizziness Standing March x 10 Standing heel raises in parallel bars x 10 Standing 3 way hip abd x 10 B, Flexion x 5 , extension x 5 ea. Walking in parallel bars with Bilateral HH with emphasis on heel strike,toe off 3 lengths and return then rested in chair, 2 lengths and return then rested. Sit to stand x 3, rest Asissted pt to car with gait belt. Able to get in independently with step stool.    07/07/2022 Standing exs in parallel bars with HH,and ambulation between activities done with gait belt and CGA due to significant dizziness Sitting hip flexion 1 x 10 each, LAQ 1 x 10 ea, Sitting marching x 10, alternating LAQ x 10 Sitting heel and toe raises 2 x10 NU Step seat 6, UE 8, level 1 x 3:30, 95 steps Standing heel raises in parallel bars x  10 Standing 3 way hip except extension x 10. Ambulated back to mat with CGA of PT on belt Practiced reaching for 4 cones;low right to high left x 2 ea , then high right  to low left x 2 ea Sit to stand from mat with light use of Ue's x 5, CG of therapist but without assist Pts husband walked her to car with gait belt with therapist observing, and pts husband placed a stool for her to step up on. Pt was able to get into car on first attempt.                                                                                                                                          DATE: Educated in bilateral hip flexion and LAQ to start doing at home and gave illustrated and written instructions. Discussed use of gait belt with husband, and walker for safety in the home due to numerous falls.  PATIENT EDUCATION:  Education details: Access Code: [email or web address] URL: [email or web address] Date: 07/05/2022 Prepared by: Cheral Almas  Exercises - Seated March  - 2-3 x daily - 7 x weekly - 1 sets - 10 reps - Seated Long Arc Quad  - 2-3 x daily - 7 x weekly - 1 sets - 10 reps Person educated: Patient and Spouse Education method: Explanation, Demonstration, and Handouts Education comprehension: verbalized understanding and returned demonstration  HOME EXERCISE PROGRAM: Sitting hip flexion, sitting LAQ  ASSESSMENT:  CLINICAL IMPRESSION: Pt had more difficulty today due to SOB, fatigue and dizziness.  She required more frequent rest periods. She notes she has not done much at home because she is fearful of falling due to dizziness. Suggested she continue to do her sitting exercises to try and maintain some strength.  She had improved gait when ambulating in parallel bars with VC's.She is doing well getting into the car using a step stool  OBJECTIVE IMPAIRMENTS: Abnormal gait, decreased activity tolerance, decreased balance, difficulty walking, decreased strength, dizziness, impaired sensation, impaired vision/preception, and postural dysfunction.   ACTIVITY LIMITATIONS: carrying, lifting, standing, squatting, sleeping, transfers, bathing, toileting, dressing,  reach over head, hygiene/grooming, and locomotion level  PARTICIPATION LIMITATIONS: meal prep, cleaning, laundry, driving, shopping, and yard work  PERSONAL FACTORS: 1-2 comorbidities: multiple Myeloma with with steroid myopathy, dizziness  are also affecting patient's functional outcome.   REHAB POTENTIAL: Good  CLINICAL DECISION MAKING: Evolving/moderate complexity  EVALUATION COMPLEXITY: Moderate  GOALS: Goals reviewed with patient? Yes    SHORT TERM GOALS =LONG TERM GOALS: Target date: 08/16/2021  Pt will be independent and compliant with HEP to improve strength and function. Baseline:  Goal status: INITIAL  2.  Pt will be able to rise from chairs with good control and minimal use of hands Baseline: max use of arms Goal status: INITIAL  3.  Pt will be able to maintain half tandem stance bilateral x 10 seconds  or more without LOB Baseline: unable Goal status: INITIAL  4.  Pt will be able to walk with improved heel toe gait pattern and normal BOS with CGA to S of PT x 25 ft Baseline: wide base of support Goal status: INITIAL  5.  Pt will be able to go up and down 1- 2 steps for improved ease getting into car. Baseline: unable Goal status: INITIAL   PLAN:  PT FREQUENCY: 2x/week  PT DURATION: 6 weeks  PLANNED INTERVENTIONS: Therapeutic exercises, Therapeutic activity, Neuromuscular re-education, Balance training, Gait training, Patient/Family education, Self Care, Stair training, and Re-evaluation  PLAN FOR NEXT SESSION: FALL RISK; USE GAIT BELT. Pt gets very dizzy;does not like to lie down., ball squeeze, NU step,Sit to stand or mini squats to table, work in parallel bars for standing strength, balance work, update HEP (pt may find it too hard to get here because of difficulty getting into large SUV and walking to car;may be appropriate for HHPT but willing to try here and see how she does.  PHYSICAL THERAPY DISCHARGE SUMMARY  Visits from Start of Care: 3  Current  functional level related to goals / functional outcomes: Unknown. Pt called to cancel appts saying she had been very ill and not able to do anything at home. Advised to see MD and pt did go to ED.  Per note in Chart she will be having HHPT   Remaining deficits: Weakness, balance, functional deficits noted in evaluation remain   Education / Equipment: NA   Patient agrees to discharge. Patient goals were not met. Patient is being discharged due to a change in medical status.Pt to be scheduled for HHPT per note in chart.   Claris Pong, PT 07/19/2022, 12:56 PM

## 2022-07-20 ENCOUNTER — Telehealth: Payer: Self-pay

## 2022-07-20 LAB — MULTIPLE MYELOMA PANEL, SERUM
Albumin SerPl Elph-Mcnc: 3.3 g/dL (ref 2.9–4.4)
Albumin/Glob SerPl: 1.3 (ref 0.7–1.7)
Alpha 1: 0.3 g/dL (ref 0.0–0.4)
Alpha2 Glob SerPl Elph-Mcnc: 0.8 g/dL (ref 0.4–1.0)
B-Globulin SerPl Elph-Mcnc: 1 g/dL (ref 0.7–1.3)
Gamma Glob SerPl Elph-Mcnc: 0.6 g/dL (ref 0.4–1.8)
Globulin, Total: 2.7 g/dL (ref 2.2–3.9)
IgA: 9 mg/dL — ABNORMAL LOW (ref 64–422)
IgG (Immunoglobin G), Serum: 753 mg/dL (ref 586–1602)
IgM (Immunoglobulin M), Srm: 10 mg/dL — ABNORMAL LOW (ref 26–217)
M Protein SerPl Elph-Mcnc: 0.5 g/dL — ABNORMAL HIGH
Total Protein ELP: 6 g/dL (ref 6.0–8.5)

## 2022-07-20 NOTE — Telephone Encounter (Signed)
Called Pt and given below message. Pt verbalized understanding and was appreciative of call.

## 2022-07-20 NOTE — Telephone Encounter (Signed)
-----   Message from Flo Shanks, RN sent at 07/20/2022 11:53 AM EST -----  ----- Message ----- From: [name], MD Sent: 07/20/2022  11:46 AM EST To: Flo Shanks, RN  Pls let Becky know her myeloma panel is still good despite not being on chemo

## 2022-07-21 ENCOUNTER — Ambulatory Visit: Payer: Medicare Other

## 2022-07-26 ENCOUNTER — Ambulatory Visit: Payer: Medicare Other

## 2022-08-03 ENCOUNTER — Ambulatory Visit: Payer: Medicare Other

## 2022-08-05 ENCOUNTER — Ambulatory Visit: Payer: Medicare Other

## 2022-08-05 ENCOUNTER — Other Ambulatory Visit: Payer: Self-pay | Admitting: Hematology and Oncology

## 2022-08-05 DIAGNOSIS — Z7189 Other specified counseling: Secondary | ICD-10-CM

## 2022-08-05 DIAGNOSIS — Z9481 Bone marrow transplant status: Secondary | ICD-10-CM

## 2022-08-05 DIAGNOSIS — C9002 Multiple myeloma in relapse: Secondary | ICD-10-CM

## 2022-08-10 ENCOUNTER — Ambulatory Visit: Payer: Medicare Other

## 2022-08-11 ENCOUNTER — Telehealth: Payer: Self-pay

## 2022-08-11 NOTE — Telephone Encounter (Signed)
Pt called and PT returned her call. Cancelled all appts secondary to not feeling well. Doesn't feel like she can do anything at all. Very nauseated. Encouraged to try to see MD sooner, or at least message them. She agreed.

## 2022-08-11 NOTE — Telephone Encounter (Signed)
Returned call and spoke with Jacqlyn Larsen and her husband. She has been laying in the bed for 6-7 days. She is having nausea/ vomiting. She take Zofran at times. She is canceling PT appts. She still has facial swelling from the steroids. She recently started having eye redness and swelling. Her husband thinks the redness and swelling is from the tissues with lotion. They switched tissues and her eyes are better now. Her right eye is more red than the left eye. Instructed Becky and her husband to go to the ED now to be evaluated. They both verbalized understanding.

## 2022-08-12 ENCOUNTER — Ambulatory Visit: Payer: Medicare Other

## 2022-08-12 ENCOUNTER — Other Ambulatory Visit: Payer: Self-pay

## 2022-08-12 ENCOUNTER — Emergency Department (HOSPITAL_BASED_OUTPATIENT_CLINIC_OR_DEPARTMENT_OTHER): Payer: Medicare Other | Admitting: Radiology

## 2022-08-12 ENCOUNTER — Encounter (HOSPITAL_BASED_OUTPATIENT_CLINIC_OR_DEPARTMENT_OTHER): Payer: Self-pay | Admitting: Emergency Medicine

## 2022-08-12 ENCOUNTER — Encounter (HOSPITAL_COMMUNITY): Payer: Self-pay

## 2022-08-12 ENCOUNTER — Emergency Department (HOSPITAL_BASED_OUTPATIENT_CLINIC_OR_DEPARTMENT_OTHER): Payer: Medicare Other

## 2022-08-12 ENCOUNTER — Inpatient Hospital Stay (HOSPITAL_BASED_OUTPATIENT_CLINIC_OR_DEPARTMENT_OTHER)
Admission: EM | Admit: 2022-08-12 | Discharge: 2022-08-14 | DRG: 641 | Disposition: A | Discharge status: Home Health | Payer: Medicare Other | Attending: Family Medicine | Admitting: Family Medicine

## 2022-08-12 DIAGNOSIS — E872 Acidosis, unspecified: Secondary | ICD-10-CM | POA: Diagnosis present

## 2022-08-12 DIAGNOSIS — E46 Unspecified protein-calorie malnutrition: Secondary | ICD-10-CM | POA: Diagnosis not present

## 2022-08-12 DIAGNOSIS — D630 Anemia in neoplastic disease: Secondary | ICD-10-CM | POA: Diagnosis not present

## 2022-08-12 DIAGNOSIS — Z887 Allergy status to serum and vaccine status: Secondary | ICD-10-CM | POA: Diagnosis not present

## 2022-08-12 DIAGNOSIS — T380X5A Adverse effect of glucocorticoids and synthetic analogues, initial encounter: Secondary | ICD-10-CM | POA: Diagnosis not present

## 2022-08-12 DIAGNOSIS — Z9841 Cataract extraction status, right eye: Secondary | ICD-10-CM | POA: Diagnosis not present

## 2022-08-12 DIAGNOSIS — Z6827 Body mass index (BMI) 27.0-27.9, adult: Secondary | ICD-10-CM | POA: Diagnosis not present

## 2022-08-12 DIAGNOSIS — Z833 Family history of diabetes mellitus: Secondary | ICD-10-CM

## 2022-08-12 DIAGNOSIS — W19XXXA Unspecified fall, initial encounter: Secondary | ICD-10-CM | POA: Diagnosis not present

## 2022-08-12 DIAGNOSIS — I341 Nonrheumatic mitral (valve) prolapse: Secondary | ICD-10-CM | POA: Diagnosis present

## 2022-08-12 DIAGNOSIS — N179 Acute kidney failure, unspecified: Secondary | ICD-10-CM | POA: Diagnosis not present

## 2022-08-12 DIAGNOSIS — Z888 Allergy status to other drugs, medicaments and biological substances status: Secondary | ICD-10-CM | POA: Diagnosis not present

## 2022-08-12 DIAGNOSIS — Z885 Allergy status to narcotic agent status: Secondary | ICD-10-CM

## 2022-08-12 DIAGNOSIS — Z961 Presence of intraocular lens: Secondary | ICD-10-CM | POA: Diagnosis not present

## 2022-08-12 DIAGNOSIS — R5381 Other malaise: Secondary | ICD-10-CM | POA: Diagnosis not present

## 2022-08-12 DIAGNOSIS — E860 Dehydration: Principal | ICD-10-CM | POA: Diagnosis present

## 2022-08-12 DIAGNOSIS — C9002 Multiple myeloma in relapse: Secondary | ICD-10-CM | POA: Diagnosis present

## 2022-08-12 DIAGNOSIS — E871 Hypo-osmolality and hyponatremia: Secondary | ICD-10-CM | POA: Diagnosis present

## 2022-08-12 DIAGNOSIS — R531 Weakness: Secondary | ICD-10-CM

## 2022-08-12 DIAGNOSIS — Z8262 Family history of osteoporosis: Secondary | ICD-10-CM

## 2022-08-12 DIAGNOSIS — G629 Polyneuropathy, unspecified: Secondary | ICD-10-CM | POA: Diagnosis present

## 2022-08-12 DIAGNOSIS — C9000 Multiple myeloma not having achieved remission: Secondary | ICD-10-CM | POA: Diagnosis present

## 2022-08-12 DIAGNOSIS — Z515 Encounter for palliative care: Secondary | ICD-10-CM | POA: Diagnosis not present

## 2022-08-12 DIAGNOSIS — Z9484 Stem cells transplant status: Secondary | ICD-10-CM

## 2022-08-12 DIAGNOSIS — Z79899 Other long term (current) drug therapy: Secondary | ICD-10-CM

## 2022-08-12 DIAGNOSIS — Z87410 Personal history of cervical dysplasia: Secondary | ICD-10-CM | POA: Diagnosis not present

## 2022-08-12 DIAGNOSIS — E878 Other disorders of electrolyte and fluid balance, not elsewhere classified: Secondary | ICD-10-CM | POA: Diagnosis present

## 2022-08-12 DIAGNOSIS — R627 Adult failure to thrive: Secondary | ICD-10-CM | POA: Diagnosis not present

## 2022-08-12 DIAGNOSIS — Z9842 Cataract extraction status, left eye: Secondary | ICD-10-CM

## 2022-08-12 DIAGNOSIS — Z66 Do not resuscitate: Secondary | ICD-10-CM | POA: Diagnosis not present

## 2022-08-12 DIAGNOSIS — G720 Drug-induced myopathy: Secondary | ICD-10-CM | POA: Diagnosis not present

## 2022-08-12 DIAGNOSIS — Z9181 History of falling: Secondary | ICD-10-CM

## 2022-08-12 DIAGNOSIS — Z1152 Encounter for screening for COVID-19: Secondary | ICD-10-CM

## 2022-08-12 DIAGNOSIS — D638 Anemia in other chronic diseases classified elsewhere: Secondary | ICD-10-CM | POA: Diagnosis present

## 2022-08-12 LAB — CBC
HCT: 39.5 % (ref 36.0–46.0)
Hemoglobin: 12.2 g/dL (ref 12.0–15.0)
MCH: 27.5 pg (ref 26.0–34.0)
MCHC: 30.9 g/dL (ref 30.0–36.0)
MCV: 89 fL (ref 80.0–100.0)
Platelets: 328 10*3/uL (ref 150–400)
RBC: 4.44 MIL/uL (ref 3.87–5.11)
RDW: 13.9 % (ref 11.5–15.5)
WBC: 12.6 10*3/uL — ABNORMAL HIGH (ref 4.0–10.5)
nRBC: 0.2 % (ref 0.0–0.2)

## 2022-08-12 LAB — HEPATIC FUNCTION PANEL
ALT: 8 U/L (ref 0–44)
AST: 20 U/L (ref 15–41)
Albumin: 4 g/dL (ref 3.5–5.0)
Alkaline Phosphatase: 68 U/L (ref 38–126)
Bilirubin, Direct: 0.2 mg/dL (ref 0.0–0.2)
Indirect Bilirubin: 0.8 mg/dL (ref 0.3–0.9)
Total Bilirubin: 1 mg/dL (ref 0.3–1.2)
Total Protein: 7.8 g/dL (ref 6.5–8.1)

## 2022-08-12 LAB — RESP PANEL BY RT-PCR (RSV, FLU A&B, COVID)  RVPGX2
Influenza A by PCR: NEGATIVE
Influenza B by PCR: NEGATIVE
Resp Syncytial Virus by PCR: NEGATIVE
SARS Coronavirus 2 by RT PCR: NEGATIVE

## 2022-08-12 LAB — LACTIC ACID, PLASMA
Lactic Acid, Venous: 2.1 mmol/L (ref 0.5–1.9)
Lactic Acid, Venous: 1.1 mmol/L (ref 0.5–1.9)

## 2022-08-12 LAB — BASIC METABOLIC PANEL
Anion gap: 20 — ABNORMAL HIGH (ref 5–15)
BUN: 18 mg/dL (ref 8–23)
CO2: 19 mmol/L — ABNORMAL LOW (ref 22–32)
Calcium: 10.1 mg/dL (ref 8.9–10.3)
Chloride: 95 mmol/L — ABNORMAL LOW (ref 98–111)
Creatinine, Ser: 1.15 mg/dL — ABNORMAL HIGH (ref 0.44–1.00)
GFR, Estimated: 51 mL/min — ABNORMAL LOW (ref >60)
Glucose, Bld: 98 mg/dL (ref 70–99)
Potassium: 4.1 mmol/L (ref 3.5–5.1)
Sodium: 134 mmol/L — ABNORMAL LOW (ref 135–145)

## 2022-08-12 LAB — URINALYSIS, ROUTINE W REFLEX MICROSCOPIC
Bacteria, UA: NONE SEEN
Glucose, UA: NEGATIVE mg/dL
Hgb urine dipstick: NEGATIVE
Ketones, ur: 15 mg/dL — AB
Leukocytes,Ua: NEGATIVE
Nitrite: NEGATIVE
Protein, ur: 30 mg/dL — AB
Specific Gravity, Urine: 1.027 (ref 1.005–1.030)
pH: 6 (ref 5.0–8.0)

## 2022-08-12 LAB — CBG MONITORING, ED: Glucose-Capillary: 76 mg/dL (ref 70–99)

## 2022-08-12 LAB — CK: Total CK: 27 U/L — ABNORMAL LOW (ref 38–234)

## 2022-08-12 MED ORDER — FENTANYL CITRATE PF 50 MCG/ML IJ SOSY: 12.5000 ug | PREFILLED_SYRINGE | INTRAMUSCULAR | Status: DC | PRN

## 2022-08-12 MED ORDER — HEPARIN SODIUM (PORCINE) 5000 UNIT/ML IJ SOLN
5000.0000 [IU] | Freq: Three times a day (TID) | INTRAMUSCULAR | Status: DC
  Administered 2022-08-12 – 2022-08-14 (×4): 5000 [IU] via SUBCUTANEOUS
  Filled 2022-08-12 (×4): qty 1

## 2022-08-12 MED ORDER — ONDANSETRON HCL 4 MG/2ML IJ SOLN
4.0000 mg | Freq: Four times a day (QID) | INTRAMUSCULAR | Status: DC | PRN
  Administered 2022-08-13: 4 mg via INTRAVENOUS
  Filled 2022-08-12: qty 2

## 2022-08-12 MED ORDER — LACTATED RINGERS IV BOLUS
1000.0000 mL | Freq: Once | INTRAVENOUS | Status: AC
  Administered 2022-08-12: 1000 mL via INTRAVENOUS

## 2022-08-12 MED ORDER — SODIUM CHLORIDE 0.9 % IV SOLN: INTRAVENOUS | Status: AC

## 2022-08-12 MED ORDER — SENNOSIDES-DOCUSATE SODIUM 8.6-50 MG PO TABS: 1.0000 | ORAL_TABLET | Freq: Every evening | ORAL | Status: DC | PRN

## 2022-08-12 MED ORDER — LACTATED RINGERS IV BOLUS
500.0000 mL | Freq: Once | INTRAVENOUS | Status: AC
  Administered 2022-08-12: 500 mL via INTRAVENOUS

## 2022-08-12 MED ORDER — ONDANSETRON HCL 4 MG PO TABS: 4.0000 mg | ORAL_TABLET | Freq: Four times a day (QID) | ORAL | Status: DC | PRN

## 2022-08-12 MED ORDER — ACETAMINOPHEN 325 MG PO TABS
650.0000 mg | ORAL_TABLET | Freq: Once | ORAL | Status: AC
  Administered 2022-08-12: 650 mg via ORAL
  Filled 2022-08-12: qty 2

## 2022-08-12 MED ORDER — OXYCODONE HCL 5 MG PO TABS
5.0000 mg | ORAL_TABLET | ORAL | Status: DC | PRN
  Administered 2022-08-13: 5 mg via ORAL
  Filled 2022-08-12: qty 1

## 2022-08-12 NOTE — ED Provider Notes (Signed)
[facility] EMERGENCY DEPT Provider Note   CSN: [id number] Arrival date & time: 08/12/22  1054     History  Chief Complaint  Patient presents with   Failure To Thrive    Jessica [name] is a 72 y.o. female.  Jessica [name] is a 72 y.o. female with a history of multiple myeloma, MVP, who presents to the emergency department for evaluation of generalized weakness.  Husband is at bedside and helps to provide history.  Patient has been on treatment for multiple myeloma since 2016.  In November they had to stop treatment due to her weakness and started her on physical therapy.  She struggles with physical therapy due to dizziness and vertigo which has been an ongoing issue for her throughout treatment.  Over the past 4 days husband reports that she has become significantly weaker unable to get out of bed or do anything on her own without significant assistance.  He also reports that she has not been able to eat or drink much of anything.  He reports she takes 1 bite of food or a sip of water and will not have anything else.  He called the oncology office yesterday and they recommended she come in and expressed concern for dehydration.  He reports that she has had multiple falls over the last few weeks, most recent fall was today as they were trying to get in the car to come to the hospital and she was coming down the steps at their house they both fell to the ground, unsure if she hit her head but no loss of consciousness.  Patient reports that she landed on her left side but reports pain all over that has been an ongoing issue for the patient.  Followed by Dr. Alvy Bimler with Oncology  The history is provided by the patient, the spouse and medical records.       Home Medications Prior to Admission medications   Medication Sig Start Date End Date Taking? Authorizing Provider  acetaminophen (TYLENOL) 500 MG tablet Take 500-1,000 mg by mouth every 6 (six) hours as needed for mild pain,  moderate pain, fever or headache.    [provider]  acyclovir (ZOVIRAX) 400 MG tablet Take 1 tablet (400 mg total) by mouth daily. 03/01/22   [name], MD  calcium carbonate (TUMS - DOSED IN MG ELEMENTAL CALCIUM) 500 MG chewable tablet Chew 1 tablet by mouth 2 (two) times daily.    [provider]  cholecalciferol (VITAMIN D3) 25 MCG (1000 UNIT) tablet Take 2,000 Units by mouth daily.    [provider]  dexamethasone (DECADRON) 4 MG tablet Take 1 tablet alternate with 1/2 tablet every other day 07/13/22   [name], MD  lidocaine-prilocaine (EMLA) cream Apply to affected area once 03/01/22   [name], MD  loratadine (CLARITIN) 10 MG tablet Take 10 mg by mouth daily as needed for allergies.    [provider]  Multiple Vitamins-Minerals (CENTRUM SILVER PO) Take 1 tablet by mouth daily.     [provider]  nystatin (MYCOSTATIN/NYSTOP) powder Apply 1 Application topically 2 (two) times daily. 05/18/22   [name], MD  omeprazole (PRILOSEC) 20 MG capsule TAKE 1 CAPSULE(20 MG) BY MOUTH DAILY 09/15/21   Alvy Bimler, Ni, MD  ondansetron (ZOFRAN) 8 MG tablet TAKE 1 TABLET(8 MG) BY MOUTH TWICE DAILY AS NEEDED FOR NAUSEA OR VOMITING 08/05/22   [name], MD  prochlorperazine (COMPAZINE) 10 MG tablet Take 1 tablet (10 mg total) by  mouth every 6 (six) hours as needed (Nausea or vomiting). 03/01/22   [name], MD  senna-docusate (SENOKOT-S) 8.6-50 MG tablet Take 1-2 tablets by mouth daily as needed for mild constipation or moderate constipation. 08/20/14   [provider]  sodium chloride (OCEAN) 0.65 % SOLN nasal spray Place 1 spray into both nostrils as needed for congestion.    [provider]  vitamin B-12 (CYANOCOBALAMIN) 500 MCG tablet Take 500 mcg by mouth daily.    [provider]      Allergies    Augmentin [amoxicillin-pot clavulanate], Albuterol, Codeine, Ibuprofen, Nsaids, Tolmetin, and Tetanus-diphth-acell pertussis     Review of Systems   Review of Systems  Constitutional:  Positive for fatigue. Negative for chills and fever.  HENT: Negative.    Respiratory:  Negative for cough and shortness of breath.   Cardiovascular:  Negative for chest pain.  Gastrointestinal:  Positive for nausea. Negative for abdominal pain and diarrhea.  Genitourinary:  Negative for dysuria and frequency.  Musculoskeletal:  Positive for myalgias.  Neurological:  Positive for weakness.    Physical Exam Updated Vital Signs BP (!) 146/119   Pulse 90   Temp 98 F (36.7 C) (Oral)   Resp 20   LMP 08/10/2003   SpO2 100%  Physical Exam Vitals and nursing note reviewed.  Constitutional:      General: She is not in acute distress.    Appearance: Normal appearance. She is well-developed. She is ill-appearing. She is not diaphoretic.     Comments: Elderly female alert and chronically ill-appearing, not in acute distress  HENT:     Head: Normocephalic and atraumatic.  Eyes:     General:        Right eye: No discharge.        Left eye: No discharge.     Extraocular Movements: Extraocular movements intact.     Pupils: Pupils are equal, round, and reactive to light.  Cardiovascular:     Rate and Rhythm: Normal rate and regular rhythm.     Pulses: Normal pulses.     Heart sounds: Normal heart sounds.  Pulmonary:     Effort: Pulmonary effort is normal. No respiratory distress.     Breath sounds: Normal breath sounds. No wheezing or rales.     Comments: Respirations equal and unlabored, patient able to speak in full sentences, lungs clear to auscultation bilaterally  Chest:     Chest wall: No tenderness.  Abdominal:     General: Bowel sounds are normal. There is no distension.     Palpations: Abdomen is soft. There is no mass.     Tenderness: There is no abdominal tenderness. There is no guarding.     Comments: Abdomen soft, nondistended, nontender to palpation in all quadrants without guarding or peritoneal signs   Musculoskeletal:        General: No deformity.     Cervical back: Neck supple. No tenderness.     Right lower leg: No edema.     Left lower leg: No edema.  Skin:    General: Skin is warm and dry.     Capillary Refill: Capillary refill takes less than 2 seconds.  Neurological:     Mental Status: She is alert and oriented to person, place, and time.     Coordination: Coordination normal.     Comments: Speech is clear, able to follow commands CN III-XII intact Normal strength in upper and lower extremities bilaterally including dorsiflexion and plantar flexion, strong  and equal grip strength Sensation normal to light and sharp touch Moves extremities without ataxia, coordination intact  Psychiatric:        Mood and Affect: Mood normal.        Behavior: Behavior normal.     ED Results / Procedures / Treatments   Labs (all labs ordered are listed, but only abnormal results are displayed) Labs Reviewed  BASIC METABOLIC PANEL - Abnormal; Notable for the following components:      Result Value   Sodium 134 (*)    Chloride 95 (*)    CO2 19 (*)    Creatinine, Ser 1.15 (*)    GFR, Estimated 51 (*)    Anion gap 20 (*)    All other components within normal limits  CBC - Abnormal; Notable for the following components:   WBC 12.6 (*)    All other components within normal limits  URINALYSIS, ROUTINE W REFLEX MICROSCOPIC - Abnormal; Notable for the following components:   Bilirubin Urine SMALL (*)    Ketones, ur 15 (*)    Protein, ur 30 (*)    All other components within normal limits  LACTIC ACID, PLASMA - Abnormal; Notable for the following components:   Lactic Acid, Venous 2.1 (*)    All other components within normal limits  CK - Abnormal; Notable for the following components:   Total CK 27 (*)    All other components within normal limits  RESP PANEL BY RT-PCR (RSV, FLU A&B, COVID)  RVPGX2  HEPATIC FUNCTION PANEL  LACTIC ACID, PLASMA  CBG MONITORING, ED    EKG EKG  Interpretation  Date/Time:  Thursday August 12 2022 11:41:20 EST Ventricular Rate:  121 PR Interval:  156 QRS Duration: 80 QT Interval:  326 QTC Calculation: 462 R Axis:   -26 Text Interpretation: Sinus tachycardia Cannot rule out Inferior infarct , age undetermined Abnormal ECG When compared with ECG of 11-May-2021 12:11, T wave inversion no longer evident in Anterolateral leads when comapred to prior, faster rate. No STEMI Confirmed by Antony Blackbird [phone]) on 08/12/2022 1:08:42 PM  Radiology CT Head Wo Contrast  Result Date: 08/12/2022 CLINICAL DATA:  Head trauma, minor (Age >= 65y); Neck trauma (Age >= 65y) EXAM: CT HEAD WITHOUT CONTRAST CT CERVICAL SPINE WITHOUT CONTRAST TECHNIQUE: Multidetector CT imaging of the head and cervical spine was performed following the standard protocol without intravenous contrast. Multiplanar CT image reconstructions of the cervical spine were also generated. RADIATION DOSE REDUCTION: This exam was performed according to the departmental dose-optimization program which includes automated exposure control, adjustment of the mA and/or kV according to patient size and/or use of iterative reconstruction technique. COMPARISON:  03/09/2021. FINDINGS: CT HEAD FINDINGS Brain: No evidence of acute infarction, hemorrhage, hydrocephalus, extra-axial collection or mass lesion/mass effect. Patchy white matter hypoattenuation, nonspecific but compatible with chronic microvascular ischemic disease. Vascular: No hyperdense vessel identified. Skull: No acute fracture.  Right frontal craniotomy. Sinuses/Orbits: No acute findings. CT CERVICAL SPINE FINDINGS Alignment: Straightening.  No substantial sagittal subluxation. Skull base and vertebrae: Vertebral body heights are maintained. No evidence of acute fracture. Soft tissues and spinal canal: No prevertebral fluid or swelling. No visible canal hematoma. Disc levels:  Similar multilevel degenerative change. Upper chest: Visualized lung  apices are clear. IMPRESSION: 1. No evidence of acute intracranial abnormality. 2. No evidence of acute fracture or traumatic malalignment in the cervical spine. Electronically Signed   By: Margaretha Sheffield M.D.   On: 08/12/2022 15:38   CT Cervical Spine Wo  Contrast  Result Date: 08/12/2022 CLINICAL DATA:  Head trauma, minor (Age >= 65y); Neck trauma (Age >= 65y) EXAM: CT HEAD WITHOUT CONTRAST CT CERVICAL SPINE WITHOUT CONTRAST TECHNIQUE: Multidetector CT imaging of the head and cervical spine was performed following the standard protocol without intravenous contrast. Multiplanar CT image reconstructions of the cervical spine were also generated. RADIATION DOSE REDUCTION: This exam was performed according to the departmental dose-optimization program which includes automated exposure control, adjustment of the mA and/or kV according to patient size and/or use of iterative reconstruction technique. COMPARISON:  03/09/2021. FINDINGS: CT HEAD FINDINGS Brain: No evidence of acute infarction, hemorrhage, hydrocephalus, extra-axial collection or mass lesion/mass effect. Patchy white matter hypoattenuation, nonspecific but compatible with chronic microvascular ischemic disease. Vascular: No hyperdense vessel identified. Skull: No acute fracture.  Right frontal craniotomy. Sinuses/Orbits: No acute findings. CT CERVICAL SPINE FINDINGS Alignment: Straightening.  No substantial sagittal subluxation. Skull base and vertebrae: Vertebral body heights are maintained. No evidence of acute fracture. Soft tissues and spinal canal: No prevertebral fluid or swelling. No visible canal hematoma. Disc levels:  Similar multilevel degenerative change. Upper chest: Visualized lung apices are clear. IMPRESSION: 1. No evidence of acute intracranial abnormality. 2. No evidence of acute fracture or traumatic malalignment in the cervical spine. Electronically Signed   By: Margaretha Sheffield M.D.   On: 08/12/2022 15:38   DG Tibia/Fibula Left  Port  Result Date: 08/12/2022 CLINICAL DATA:  Falls. EXAM: PORTABLE LEFT TIBIA AND FIBULA - 2 VIEW COMPARISON:  None Available. FINDINGS: No definite fracture or dislocation is seen. IMPRESSION: Grossly normal left tibia and fibula. Electronically Signed   By: Marijo Conception M.D.   On: 08/12/2022 14:59   DG Pelvis Portable  Result Date: 08/12/2022 CLINICAL DATA:  Multiple falls. EXAM: PORTABLE PELVIS 1-2 VIEWS COMPARISON:  May 11, 2021. FINDINGS: There is no evidence of pelvic fracture or diastasis. No pelvic bone lesions are seen. IMPRESSION: Negative. Electronically Signed   By: Marijo Conception M.D.   On: 08/12/2022 14:58   DG Chest Port 1 View  Result Date: 08/12/2022 CLINICAL DATA:  Multiple falls. EXAM: PORTABLE CHEST 1 VIEW COMPARISON:  March 09, 2021. FINDINGS: The heart size and mediastinal contours are within normal limits. Both lungs are clear. Right internal jugular Port-A-Cath is noted with distal tip in expected position of cavoatrial junction. The visualized skeletal structures are unremarkable. IMPRESSION: No active disease. Electronically Signed   By: Marijo Conception M.D.   On: 08/12/2022 14:57   DG Femur 1 View Left  Result Date: 08/12/2022 CLINICAL DATA:  Fall. EXAM: LEFT FEMUR 1 VIEW COMPARISON:  None Available. FINDINGS: There is no evidence of fracture or other focal bone lesions. Soft tissues are unremarkable. IMPRESSION: Negative. Electronically Signed   By: Kerby Moors M.D.   On: 08/12/2022 14:56    Procedures Procedures    Medications Ordered in ED Medications  lactated ringers bolus 1,000 mL (1,000 mLs Intravenous New Bag/Given 08/12/22 1554)  acetaminophen (TYLENOL) tablet 650 mg (650 mg Oral Given 08/12/22 1705)  lactated ringers bolus 500 mL (500 mLs Intravenous New Bag/Given 08/12/22 1745)    ED Course/ Medical Decision Making/ A&P                           Medical Decision Making Amount and/or Complexity of Data Reviewed Labs: ordered. Radiology:  ordered.  Risk OTC drugs. Decision regarding hospitalization.   73 y.o. female presents to the ED with  complaints of generalized weakness, decreased oral intake, falls, this involves an extensive number of treatment options, and is a complaint that carries with it a high risk of complications and morbidity.  The differential diagnosis includes failure to thrive, dehydration, electrolyte derangements, progression of multiple myeloma, head bleed, skull fracture, hip fracture, pneumonia, UTI or other infection, viral respiratory infection   On arrival pt is frail and ill-appearing, vitals stable.  On exam no focal neurologic deficits but patient is generally weak and requires high level of assistance.  Additional history obtained from husband at bedside. Previous records obtained and reviewed   I ordered medication including IV fluid bolus  Lab Tests:  I Ordered, reviewed, and interpreted labs, which included: Mild leukocytosis of 12.2, normal hemoglobin, mild hyponatremia and hypochloremia with CO2 of 19, likely in the setting of dehydration, creatinine of 1.5 increased from baseline of 0.7-0.8, LFTs unremarkable, lactic acid mildly elevated at 2.1 will trend after patient received fluids, CK is not elevated, respiratory viral panel is negative, urinalysis without signs of infection  Imaging Studies ordered:  I ordered imaging studies which included CT head and cervical spine as well as x-rays of the chest, pelvis, left femur and tib-fib, I independently visualized and interpreted imaging which showed no acute intracranial bleeding or mass, no C-spine fracture or for lytic lesions, chest x-ray without evidence of pneumonia, no fractures to the pelvis or left lower extremity.  ED Course:   Patient's lab work is significant for dehydration and mildly increased creatinine, given patient's general weakness and decreased oral to intake I suspect that her renal function would continue to decline  without hospital admission for continued rehydration and monitoring.  Patient's husband is having difficulty caring for patient given worsening weakness.  Will consult for admission for failure to thrive.  Case discussed with Dr. Eliseo Squires, with [facility] hospitalist who accepts patient for transfer and admission.  Portions of this note were generated with [employer]. Dictation errors may occur despite best attempts at proofreading.         Final Clinical Impression(s) / ED Diagnoses Final diagnoses:  Failure to thrive in adult  Dehydration    Rx / DC Orders ED Discharge Orders     None         Jacqlyn Larsen, [place] 08/12/22 1847    Varney Biles, MD 08/12/22 2350

## 2022-08-12 NOTE — ED Notes (Signed)
Bladder scan shows 126m

## 2022-08-12 NOTE — ED Triage Notes (Signed)
Pt presents to ED POV. Per husband pt has been being treated for cancer since 2016 but the past couple days has significantly declined. Extremely weak, nauseous, no appetite, and in pain all over.

## 2022-08-12 NOTE — H&P (Signed)
History and Physical    Jessica [name] IRC:[id number] DOB: 1951/06/22 DOA: 08/12/2022  PCP: Kathalene Frames, MD  Patient coming from: home  I have personally briefly reviewed patient's old medical records in [place]  Chief Complaint: failure to thrive: n/v/weakness/ diffuse pain   HPI: Jessica [name] is a 72 y.o. 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 Patient was referred to ED by oncology due to concern for dehydration and for further evaluation. Patient  currently notes no fever /chills/ cough / sob/ or chest pain. She also currently has no pain or nausea or vomiting. She does endorse fatigue and generalized weakness.  ED Course:  Afeb, bp 94/56, hr 125, rr 20 sat 96% on ra  Respiratory panel: neg  PZW:CHENI tachycardia , q in III/avf Wbc 12.6, hgb 12.2, plt328 N a 134( 139), K 4.1, CL 95 ,bicarb 19, cr 1.15 (0.88), aG 20 Ua + ketones, neg  CK 27  Lactic 2.1 Cxr"NAD CTH/Cervical spine NAD Review of Systems: As per HPI otherwise 10 point review of systems negative.   Past Medical History:  Diagnosis Date   H/O stem cell transplant [facility]) 03/2015   [facility]   Multiple myeloma [facility]) 11/19/2014   Multiple myeloma ([place])    Multiple myeloma ([facility])    MVP (mitral valve prolapse)    req prophylaxis   Other constipation 12/03/2014   Right ovarian cyst 10/16/15   16 mm simple cyst - ultrasound yearly.   Upper respiratory infection, acute 08/12/2015    Past Surgical History:  Procedure Laterality Date   CATARACT EXTRACTION, BILATERAL  10/2010   Implants(ReSTOR) Bilat   IR IMAGING GUIDED PORT INSERTION  03/04/2022   LASER ABLATION OF CONDYLOMAS  1990   CIN  1 cervix   multiple myeloma surgery Right 08/2014   [facility]     reports that she has never smoked. She has never used smokeless tobacco. She reports that she does not drink alcohol and does not use drugs.  Allergies  Allergen Reactions   Augmentin [Amoxicillin-Pot Clavulanate] Hives, Itching, Nausea And Vomiting and Rash    ++Tolerates Cefepime++ Has patient had a PCN reaction causing immediate rash, facial/tongue/throat swelling, SOB or lightheadedness with hypotension: Yes Has patient had a PCN reaction causing severe rash involving mucus membranes or skin necrosis: Yes Has patient had a PCN reaction that required hospitalization No Has patient had a PCN reaction occurring within the last 10 years: Yes If all of the above answers are "NO", then may proceed with Cephalosporin use. *okay to take other types of cillin   Albuterol Other (See Comments)    Elevated HR and BP   Codeine Nausea Only   Ibuprofen Nausea Only   Nsaids Other (See Comments)    Gi Upset   Tolmetin Other (See Comments)    Gi Upset   Tetanus-Diphth-Acell Pertussis Rash    Received injection in ER on 8/1, developed fine rash all over body. Cellulitis to injection site on left deltoid.    Family History  Problem Relation Age of Onset   Osteoporosis Mother    Diabetes Father     Prior to Admission medications   Medication Sig Start Date End Date Taking? Authorizing  Provider  acetaminophen (TYLENOL) 500 MG tablet Take 500-1,000 mg by mouth every 6 (six) hours as needed for mild pain, moderate pain, fever or headache.    [provider]  acyclovir (ZOVIRAX) 400 MG tablet Take 1 tablet (400 mg total) by mouth daily. 03/01/22   [name], MD  calcium carbonate (TUMS - DOSED IN MG ELEMENTAL CALCIUM) 500 MG chewable tablet Chew 1 tablet by mouth 2 (two) times daily.    [provider]  cholecalciferol (VITAMIN D3) 25 MCG (1000 UNIT) tablet Take 2,000 Units by mouth daily.    [provider]  dexamethasone (DECADRON) 4 MG tablet Take 1 tablet alternate with 1/2 tablet every other day 07/13/22   [name], MD  lidocaine-prilocaine (EMLA) cream Apply to affected area once 03/01/22   [name], MD  loratadine (CLARITIN) 10 MG tablet Take 10 mg by mouth daily as needed for allergies.    [provider]  Multiple Vitamins-Minerals (CENTRUM SILVER PO) Take 1 tablet by mouth daily.     [provider]  nystatin (MYCOSTATIN/NYSTOP) powder Apply 1 Application topically 2 (two) times daily. 05/18/22   [name], MD  omeprazole (PRILOSEC) 20 MG capsule TAKE 1 CAPSULE(20 MG) BY MOUTH DAILY 09/15/21   Alvy Bimler, Ni, MD  ondansetron (ZOFRAN) 8 MG tablet TAKE 1 TABLET(8 MG) BY MOUTH TWICE DAILY AS NEEDED FOR NAUSEA OR VOMITING 08/05/22   [name], MD  prochlorperazine (COMPAZINE) 10 MG tablet Take 1 tablet (10 mg total) by mouth every 6 (six) hours as needed (Nausea or vomiting). 03/01/22   [name], MD  senna-docusate (SENOKOT-S) 8.6-50 MG tablet Take 1-2 tablets by mouth daily as needed for mild constipation or moderate constipation. 08/20/14   [provider]  sodium chloride (OCEAN) 0.65 % SOLN nasal spray Place 1 spray into both nostrils as needed for congestion.    [provider]  vitamin B-12 (CYANOCOBALAMIN) 500 MCG tablet Take 500 mcg by mouth daily.    [provider]    Physical Exam: Vitals:   08/12/22 1430 08/12/22 1600 08/12/22 1656 08/12/22 1937  BP: (!) 149/115 (!) 146/119  138/87  Pulse: (!) 108 90  80  Resp: (!) _0 Temp:   98 F (36.7 C) 98.2 F (36.8 C)  TempSrc:   Oral Oral  SpO2: 99% 100%  100%    Constitutional: NAD, calm, comfortable,pale Vitals:   08/12/22 1430 08/12/22 1600 08/12/22 1656 08/12/22 1937  BP: (!) 149/115 (!) 146/119  138/87  Pulse: (!) 108 90  80  Resp: (!) _1 Temp:   98 F (36.7 C) 98.2 F (36.8 C)  TempSrc:   Oral Oral  SpO2: 99% 100%  100%   Eyes: PERRL, lids  and conjunctivae normal ENMT: Mucous membranes are moist. Posterior pharynx clear of any exudate or lesions.Normal dentition.  Neck: normal, supple, no masses, no thyromegaly Respiratory: clear to auscultation bilaterally, no wheezing, no crackles. Normal respiratory effort. No accessory muscle use.  Cardiovascular: Regular rate and rhythm, no murmurs / rubs / gallops. No extremity edema. 2+ pedal pulses.  Abdomen: no tenderness, no masses palpated. No hepatosplenomegaly. Bowel sounds positive.  Musculoskeletal: no clubbing / cyanosis. No joint deformity upper and lower extremities. Good ROM, no contractures. Normal muscle tone.  Skin: no rashes, lesions, ulcers. No induration Neurologic: CN 2-12 grossly intact. Sensation intact,  Strength 4+/5 in all 4.  Psychiatric: Normal judgment and insight. Alert and oriented x 3. Normal  mood.    Labs on Admission: I have personally reviewed following labs and imaging studies  CBC: Recent Labs  Lab 08/12/22 1331  WBC 12.6*  HGB 12.2  HCT 39.5  MCV 89.0  PLT 761   Basic Metabolic Panel: Recent Labs  Lab 08/12/22 1331  NA 134*  K 4.1  CL 95*  CO2 19*  GLUCOSE 98  BUN 18  CREATININE 1.15*  CALCIUM 10.1   GFR: CrCl cannot be calculated (Unknown ideal weight.). Liver Function Tests: Recent Labs  Lab 08/12/22 1331  AST 20  ALT 8  ALKPHOS 68  BILITOT 1.0  PROT 7.8  ALBUMIN 4.0   No results for input(s): "LIPASE", "AMYLASE" in the last 168 hours. No results for input(s): "AMMONIA" in the last 168 hours. Coagulation Profile: No results for input(s): "INR", "PROTIME" in the last 168 hours. Cardiac Enzymes: Recent Labs  Lab 08/12/22 1536  CKTOTAL 27*   BNP (last 3 results) No results for input(s): "PROBNP" in the last 8760 hours. HbA1C: No results for input(s): "HGBA1C" in the last 72 hours. CBG: Recent Labs  Lab 08/12/22 1139  GLUCAP 76   Lipid Profile: No results for input(s): "CHOL", "HDL", "LDLCALC", "TRIG",  "CHOLHDL", "LDLDIRECT" in the last 72 hours. Thyroid Function Tests: No results for input(s): "TSH", "T4TOTAL", "FREET4", "T3FREE", "THYROIDAB" in the last 72 hours. Anemia Panel: No results for input(s): "VITAMINB12", "FOLATE", "FERRITIN", "TIBC", "IRON", "RETICCTPCT" in the last 72 hours. Urine analysis:    Component Value Date/Time   COLORURINE YELLOW 08/12/2022 1331   APPEARANCEUR CLEAR 08/12/2022 1331   LABSPEC 1.027 08/12/2022 1331   PHURINE 6.0 08/12/2022 1331   GLUCOSEU NEGATIVE 08/12/2022 1331   HGBUR NEGATIVE 08/12/2022 1331   BILIRUBINUR SMALL (A) 08/12/2022 1331   BILIRUBINUR n 03/19/2013 1445   KETONESUR 15 (A) 08/12/2022 1331   PROTEINUR 30 (A) 08/12/2022 1331   UROBILINOGEN negative 03/19/2013 1445   NITRITE NEGATIVE 08/12/2022 1331   LEUKOCYTESUR NEGATIVE 08/12/2022 1331    Radiological Exams on Admission: CT Head Wo Contrast  Result Date: 08/12/2022 CLINICAL DATA:  Head trauma, minor (Age >= 65y); Neck trauma (Age >= 65y) EXAM: CT HEAD WITHOUT CONTRAST CT CERVICAL SPINE WITHOUT CONTRAST TECHNIQUE: Multidetector CT imaging of the head and cervical spine was performed following the standard protocol without intravenous contrast. Multiplanar CT image reconstructions of the cervical spine were also generated. RADIATION DOSE REDUCTION: This exam was performed according to the departmental dose-optimization program which includes automated exposure control, adjustment of the mA and/or kV according to patient size and/or use of iterative reconstruction technique. COMPARISON:  03/09/2021. FINDINGS: CT HEAD FINDINGS Brain: No evidence of acute infarction, hemorrhage, hydrocephalus, extra-axial collection or mass lesion/mass effect. Patchy white matter hypoattenuation, nonspecific but compatible with chronic microvascular ischemic disease. Vascular: No hyperdense vessel identified. Skull: No acute fracture.  Right frontal craniotomy. Sinuses/Orbits: No acute findings. CT CERVICAL  SPINE FINDINGS Alignment: Straightening.  No substantial sagittal subluxation. Skull base and vertebrae: Vertebral body heights are maintained. No evidence of acute fracture. Soft tissues and spinal canal: No prevertebral fluid or swelling. No visible canal hematoma. Disc levels:  Similar multilevel degenerative change. Upper chest: Visualized lung apices are clear. IMPRESSION: 1. No evidence of acute intracranial abnormality. 2. No evidence of acute fracture or traumatic malalignment in the cervical spine. Electronically Signed   By: Margaretha Sheffield M.D.   On: 08/12/2022 15:38   CT Cervical Spine Wo Contrast  Result Date: 08/12/2022 CLINICAL DATA:  Head trauma, minor (Age >= 65y);  Neck trauma (Age >= 65y) EXAM: CT HEAD WITHOUT CONTRAST CT CERVICAL SPINE WITHOUT CONTRAST TECHNIQUE: Multidetector CT imaging of the head and cervical spine was performed following the standard protocol without intravenous contrast. Multiplanar CT image reconstructions of the cervical spine were also generated. RADIATION DOSE REDUCTION: This exam was performed according to the departmental dose-optimization program which includes automated exposure control, adjustment of the mA and/or kV according to patient size and/or use of iterative reconstruction technique. COMPARISON:  03/09/2021. FINDINGS: CT HEAD FINDINGS Brain: No evidence of acute infarction, hemorrhage, hydrocephalus, extra-axial collection or mass lesion/mass effect. Patchy white matter hypoattenuation, nonspecific but compatible with chronic microvascular ischemic disease. Vascular: No hyperdense vessel identified. Skull: No acute fracture.  Right frontal craniotomy. Sinuses/Orbits: No acute findings. CT CERVICAL SPINE FINDINGS Alignment: Straightening.  No substantial sagittal subluxation. Skull base and vertebrae: Vertebral body heights are maintained. No evidence of acute fracture. Soft tissues and spinal canal: No prevertebral fluid or swelling. No visible canal  hematoma. Disc levels:  Similar multilevel degenerative change. Upper chest: Visualized lung apices are clear. IMPRESSION: 1. No evidence of acute intracranial abnormality. 2. No evidence of acute fracture or traumatic malalignment in the cervical spine. Electronically Signed   By: Margaretha Sheffield M.D.   On: 08/12/2022 15:38   DG Tibia/Fibula Left Port  Result Date: 08/12/2022 CLINICAL DATA:  Falls. EXAM: PORTABLE LEFT TIBIA AND FIBULA - 2 VIEW COMPARISON:  None Available. FINDINGS: No definite fracture or dislocation is seen. IMPRESSION: Grossly normal left tibia and fibula. Electronically Signed   By: Marijo Conception M.D.   On: 08/12/2022 14:59   DG Pelvis Portable  Result Date: 08/12/2022 CLINICAL DATA:  Multiple falls. EXAM: PORTABLE PELVIS 1-2 VIEWS COMPARISON:  May 11, 2021. FINDINGS: There is no evidence of pelvic fracture or diastasis. No pelvic bone lesions are seen. IMPRESSION: Negative. Electronically Signed   By: Marijo Conception M.D.   On: 08/12/2022 14:58   DG Chest Port 1 View  Result Date: 08/12/2022 CLINICAL DATA:  Multiple falls. EXAM: PORTABLE CHEST 1 VIEW COMPARISON:  March 09, 2021. FINDINGS: The heart size and mediastinal contours are within normal limits. Both lungs are clear. Right internal jugular Port-A-Cath is noted with distal tip in expected position of cavoatrial junction. The visualized skeletal structures are unremarkable. IMPRESSION: No active disease. Electronically Signed   By: Marijo Conception M.D.   On: 08/12/2022 14:57   DG Femur 1 View Left  Result Date: 08/12/2022 CLINICAL DATA:  Fall. EXAM: LEFT FEMUR 1 VIEW COMPARISON:  None Available. FINDINGS: There is no evidence of fracture or other focal bone lesions. Soft tissues are unremarkable. IMPRESSION: Negative. Electronically Signed   By: Kerby Moors M.D.   On: 08/12/2022 14:56    EKG: Independently reviewed. 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

## 2022-08-13 ENCOUNTER — Other Ambulatory Visit: Payer: Self-pay | Admitting: Hematology and Oncology

## 2022-08-13 DIAGNOSIS — R5381 Other malaise: Secondary | ICD-10-CM | POA: Diagnosis present

## 2022-08-13 DIAGNOSIS — C9002 Multiple myeloma in relapse: Secondary | ICD-10-CM | POA: Diagnosis present

## 2022-08-13 DIAGNOSIS — Z1152 Encounter for screening for COVID-19: Secondary | ICD-10-CM | POA: Diagnosis not present

## 2022-08-13 DIAGNOSIS — Z9841 Cataract extraction status, right eye: Secondary | ICD-10-CM | POA: Diagnosis not present

## 2022-08-13 DIAGNOSIS — Z961 Presence of intraocular lens: Secondary | ICD-10-CM | POA: Diagnosis present

## 2022-08-13 DIAGNOSIS — T380X5A Adverse effect of glucocorticoids and synthetic analogues, initial encounter: Secondary | ICD-10-CM | POA: Diagnosis present

## 2022-08-13 DIAGNOSIS — Z87410 Personal history of cervical dysplasia: Secondary | ICD-10-CM | POA: Diagnosis not present

## 2022-08-13 DIAGNOSIS — Z7189 Other specified counseling: Secondary | ICD-10-CM | POA: Diagnosis not present

## 2022-08-13 DIAGNOSIS — E860 Dehydration: Secondary | ICD-10-CM | POA: Diagnosis present

## 2022-08-13 DIAGNOSIS — E46 Unspecified protein-calorie malnutrition: Secondary | ICD-10-CM | POA: Diagnosis present

## 2022-08-13 DIAGNOSIS — W19XXXA Unspecified fall, initial encounter: Secondary | ICD-10-CM | POA: Diagnosis present

## 2022-08-13 DIAGNOSIS — R627 Adult failure to thrive: Secondary | ICD-10-CM | POA: Diagnosis present

## 2022-08-13 DIAGNOSIS — Z6827 Body mass index (BMI) 27.0-27.9, adult: Secondary | ICD-10-CM | POA: Diagnosis not present

## 2022-08-13 DIAGNOSIS — D630 Anemia in neoplastic disease: Secondary | ICD-10-CM | POA: Diagnosis present

## 2022-08-13 DIAGNOSIS — Z888 Allergy status to other drugs, medicaments and biological substances status: Secondary | ICD-10-CM | POA: Diagnosis not present

## 2022-08-13 DIAGNOSIS — Z885 Allergy status to narcotic agent status: Secondary | ICD-10-CM | POA: Diagnosis not present

## 2022-08-13 DIAGNOSIS — E872 Acidosis, unspecified: Secondary | ICD-10-CM | POA: Diagnosis present

## 2022-08-13 DIAGNOSIS — Z515 Encounter for palliative care: Secondary | ICD-10-CM | POA: Diagnosis not present

## 2022-08-13 DIAGNOSIS — I341 Nonrheumatic mitral (valve) prolapse: Secondary | ICD-10-CM | POA: Diagnosis present

## 2022-08-13 DIAGNOSIS — N179 Acute kidney failure, unspecified: Secondary | ICD-10-CM | POA: Diagnosis present

## 2022-08-13 DIAGNOSIS — G629 Polyneuropathy, unspecified: Secondary | ICD-10-CM | POA: Diagnosis present

## 2022-08-13 DIAGNOSIS — G720 Drug-induced myopathy: Secondary | ICD-10-CM | POA: Diagnosis present

## 2022-08-13 DIAGNOSIS — Z66 Do not resuscitate: Secondary | ICD-10-CM | POA: Diagnosis present

## 2022-08-13 DIAGNOSIS — Z9842 Cataract extraction status, left eye: Secondary | ICD-10-CM | POA: Diagnosis not present

## 2022-08-13 DIAGNOSIS — Z887 Allergy status to serum and vaccine status: Secondary | ICD-10-CM | POA: Diagnosis not present

## 2022-08-13 DIAGNOSIS — Z9484 Stem cells transplant status: Secondary | ICD-10-CM | POA: Diagnosis not present

## 2022-08-13 LAB — TSH: TSH: 0.707 u[IU]/mL (ref 0.350–4.500)

## 2022-08-13 LAB — COMPREHENSIVE METABOLIC PANEL
ALT: 9 U/L (ref 0–44)
AST: 15 U/L (ref 15–41)
Albumin: 2.4 g/dL — ABNORMAL LOW (ref 3.5–5.0)
Alkaline Phosphatase: 49 U/L (ref 38–126)
Anion gap: 11 (ref 5–15)
BUN: 11 mg/dL (ref 8–23)
CO2: 22 mmol/L (ref 22–32)
Calcium: 7.7 mg/dL — ABNORMAL LOW (ref 8.9–10.3)
Chloride: 103 mmol/L (ref 98–111)
Creatinine, Ser: 0.85 mg/dL (ref 0.44–1.00)
GFR, Estimated: >60 mL/min (ref >60)
Glucose, Bld: 64 mg/dL — ABNORMAL LOW (ref 70–99)
Potassium: 3.4 mmol/L — ABNORMAL LOW (ref 3.5–5.1)
Sodium: 136 mmol/L (ref 135–145)
Total Bilirubin: 1 mg/dL (ref 0.3–1.2)
Total Protein: 5.2 g/dL — ABNORMAL LOW (ref 6.5–8.1)

## 2022-08-13 LAB — CBC
HCT: 28.9 % — ABNORMAL LOW (ref 36.0–46.0)
Hemoglobin: 9.3 g/dL — ABNORMAL LOW (ref 12.0–15.0)
MCH: 27.8 pg (ref 26.0–34.0)
MCHC: 32.2 g/dL (ref 30.0–36.0)
MCV: 86.5 fL (ref 80.0–100.0)
Platelets: 275 10*3/uL (ref 150–400)
RBC: 3.34 MIL/uL — ABNORMAL LOW (ref 3.87–5.11)
RDW: 13.6 % (ref 11.5–15.5)
WBC: 6.6 10*3/uL (ref 4.0–10.5)
nRBC: 0 % (ref 0.0–0.2)

## 2022-08-13 LAB — HEMOGLOBIN A1C
Hgb A1c MFr Bld: 4.9 % (ref 4.8–5.6)
Mean Plasma Glucose: 94 mg/dL

## 2022-08-13 LAB — PHOSPHORUS: Phosphorus: 2.7 mg/dL (ref 2.5–4.6)

## 2022-08-13 LAB — MAGNESIUM: Magnesium: 1.8 mg/dL (ref 1.7–2.4)

## 2022-08-13 LAB — FERRITIN: Ferritin: 52 ng/mL (ref 11–307)

## 2022-08-13 LAB — CORTISOL: Cortisol, Plasma: 7 ug/dL

## 2022-08-13 LAB — FOLATE: Folate: 31.6 ng/mL (ref >5.9)

## 2022-08-13 LAB — C-REACTIVE PROTEIN: CRP: 14.1 mg/dL — ABNORMAL HIGH (ref <1.0)

## 2022-08-13 LAB — VITAMIN B12: Vitamin B-12: 1313 pg/mL — ABNORMAL HIGH (ref 180–914)

## 2022-08-13 MED ORDER — DEXAMETHASONE 4 MG PO TABS: 4.0000 mg | ORAL_TABLET | Freq: Every day | ORAL | Status: DC

## 2022-08-13 MED ORDER — NAPHAZOLINE-PHENIRAMINE 0.025-0.3 % OP SOLN
1.0000 [drp] | Freq: Four times a day (QID) | OPHTHALMIC | Status: DC | PRN
  Administered 2022-08-13 (×2): 1 [drp] via OPHTHALMIC
  Filled 2022-08-13: qty 15

## 2022-08-13 MED ORDER — LORATADINE 10 MG PO TABS: 10.0000 mg | ORAL_TABLET | Freq: Every day | ORAL | Status: DC | PRN

## 2022-08-13 MED ORDER — VITAMIN D 25 MCG (1000 UNIT) PO TABS
2000.0000 [IU] | ORAL_TABLET | Freq: Every day | ORAL | Status: DC
  Administered 2022-08-13 – 2022-08-14 (×2): 2000 [IU] via ORAL
  Filled 2022-08-13 (×2): qty 2

## 2022-08-13 MED ORDER — VITAMIN B-12 1000 MCG PO TABS
500.0000 ug | ORAL_TABLET | Freq: Every day | ORAL | Status: DC
  Administered 2022-08-13 – 2022-08-14 (×2): 500 ug via ORAL
  Filled 2022-08-13 (×2): qty 1

## 2022-08-13 MED ORDER — PANTOPRAZOLE SODIUM 40 MG PO TBEC
40.0000 mg | DELAYED_RELEASE_TABLET | Freq: Every day | ORAL | Status: DC
  Administered 2022-08-13 – 2022-08-14 (×2): 40 mg via ORAL
  Filled 2022-08-13 (×2): qty 1

## 2022-08-13 MED ORDER — DEXAMETHASONE 2 MG PO TABS: 2.0000 mg | ORAL_TABLET | ORAL | Status: DC

## 2022-08-13 MED ORDER — ACYCLOVIR 400 MG PO TABS
400.0000 mg | ORAL_TABLET | Freq: Every day | ORAL | Status: DC
  Administered 2022-08-13 – 2022-08-14 (×2): 400 mg via ORAL
  Filled 2022-08-13 (×2): qty 1

## 2022-08-13 MED ORDER — SODIUM CHLORIDE 0.9 % IV SOLN: INTRAVENOUS | Status: AC

## 2022-08-13 MED ORDER — DEXAMETHASONE 4 MG PO TABS: 4.0000 mg | ORAL_TABLET | ORAL | Status: DC

## 2022-08-13 MED ORDER — DEXAMETHASONE 4 MG PO TABS
4.0000 mg | ORAL_TABLET | Freq: Every day | ORAL | Status: AC
  Administered 2022-08-13 – 2022-08-14 (×2): 4 mg via ORAL
  Filled 2022-08-13 (×2): qty 1

## 2022-08-13 NOTE — Progress Notes (Signed)
[facility]                                                                              Jessica [name], is a 72 y.o. female, DOB - 08-Feb-1951, IWP:[id number] Admit date - 08/12/2022    Outpatient Primary MD for the patient is Koleen Nimrod Walker Shadow, MD  LOS - 0  days  Chief Complaint  Patient presents with   Failure To Thrive       Brief summary   Patient is a 72 year old female with history of multiple myeloma, anemia, neuropathy, steroid myopathy presented to ED with worsening generalized weakness.  Patient had been on treatment for multiple myeloma since 2016 and in November, 2023, day had to stop treatment due to generalized weakness and started on PT.  Per patient, she has been struggling with PT due to dizziness.  In the last 4 days, she has been having persistent nausea, vomiting, diarrhea, weakness and diffuse body pain.  She was referred to ED by oncology due to concern for dehydration and further evaluation.  Denied any fevers, chills, cough, shortness of breath or chest pain.  No focal weakness. In ED, afebrile, BP 94/56 heart rate 125, RR 20, O2 sats 96% on room air WBCs 12.6, hemoglobin 12.2, platelets 328 K. Creatinine 1.15, sodium 134, anion gap 20 Lactic acid 2.1 CT head negative, CT C-spine negative Chest x-ray negative for any infiltrates.  Left femur, pelvis, left tibia-fibula imaging negative for any fracture or dislocation. Patient was admitted for further workup  Assessment & Plan    Principal Problem:   FTT (failure to thrive) in adult, progressive generalized weakness, postural dizziness -In the setting of multiple myeloma, off the treatment, due to poor performance status -Infectious workup negative so far -Patient has history of postural dizziness, steroid myopathy, has been evaluated by cardiology and neurology in the past.  - Reviewed prior oncology notes by her oncologist, Dr Alvy Bimler her last visit on 07/13/2022, when she is on full dose  dexamethasone, patient's energy level appetite and dizziness resolved, had recommended a mild gentle steroid taper (Decadron 4 mg and 2 mg on alternating days) -Will add on cortisol lab, placed on Decadron 4 mg x 2days, then resume outpatient dose of alternating 4 mg and 2 mg as recommended by Dr Alvy Bimler -PT OT consult, palliative medicine consulted  Active Problems: Acute kidney injury with lactic acidosis -Creatinine 1.15 on admission, baseline 0.7-0.8 - likely due to dehydration, poor p.o. intake -Continue gentle hydration     Multiple myeloma in relapse ([place]) -Reviewed prior oncology notes, patient has been off the treatment due to poor performance status -Continue supportive treatment, PT, palliative consulted     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 -Follow N05, folic acid, has history of B12 deficiency and pancytopenia in the past -H&H currently stable   Code Status: Full CODE STATUS DVT Prophylaxis:  heparin injection 5,000 Units Start: 08/12/22 2315   Level of Care: Level of care: Telemetry Medical Family Communication: Updated patient Disposition Plan:      Remains inpatient appropriate:  Procedures:  None  Consultants:   Palliative medicine  Antimicrobials:   Medications  acyclovir  400 mg Oral Daily   cholecalciferol  2,000 Units Oral Daily   cyanocobalamin  500 mcg Oral Daily   dexamethasone  4 mg Oral Daily   heparin  5,000 Units Subcutaneous Q8H   pantoprazole  40 mg Oral Daily      Subjective:   Jessica [name] was seen and examined today.  Complaining of generalized weakness, postural dizziness.  No acute vomiting, abdominal pain, diarrhea.  No chest pain or shortness of breath.  No fevers  Objective:   Vitals:   08/12/22 1937 08/12/22 2355 08/13/22 0350 08/13/22 0950  BP: 138/87 (!) 149/83 124/60 136/78  Pulse: 80 89 85 88  Resp: '18 18 17 18  '$ Temp: 98.2 F (36.8 C) 98 F (36.7 C) 98.1 F (36.7 C) 98.2 F (36.8 C)  TempSrc: Oral Oral Oral Oral   SpO2: 100% 93% 100% 100%  Weight: 63.1 kg       Intake/Output Summary (Last 24 hours) at 08/13/2022 1247 Last data filed at 08/13/2022 0800 Gross per 24 hour  Intake 223.45 ml  Output 0 ml  Net 223.45 ml     Wt Readings from Last 3 Encounters:  08/12/22 63.1 kg  07/13/22 59.1 kg  06/15/22 56.2 kg     Exam General: Alert and oriented x 3, NAD Cardiovascular: S1 S2 auscultated,  RRR Respiratory: Clear to auscultation bilaterally, no wheezing Gastrointestinal: Soft, nontender, nondistended, + bowel sounds Ext: no pedal edema bilaterally Neuro: Strength 5/5 upper and lower extremities bilaterally Psych: Normal affect and demeanor, alert and oriented x3     Data Reviewed:  I have personally reviewed following labs    CBC Lab Results  Component Value Date   WBC 6.6 08/13/2022   RBC 3.34 (L) 08/13/2022   HGB 9.3 (L) 08/13/2022   HCT 28.9 (L) 08/13/2022   MCV 86.5 08/13/2022   MCH 27.8 08/13/2022   PLT 275 08/13/2022   MCHC 32.2 08/13/2022   RDW 13.6 08/13/2022   LYMPHSABS 2.0 07/13/2022   MONOABS 0.6 07/13/2022   EOSABS 0.1 07/13/2022   BASOSABS 0.1 94/17/4081     Last metabolic panel Lab Results  Component Value Date   NA 136 08/13/2022   K 3.4 (L) 08/13/2022   CL 103 08/13/2022   CO2 22 08/13/2022   BUN 11 08/13/2022   CREATININE 0.85 08/13/2022   GLUCOSE 64 (L) 08/13/2022   GFRNONAA >60 08/13/2022   GFRAA 57 (L) 04/21/2020   CALCIUM 7.7 (L) 08/13/2022   PHOS 2.7 08/13/2022   PROT 5.2 (L) 08/13/2022   ALBUMIN 2.4 (L) 08/13/2022   LABGLOB 2.7 07/13/2022   BILITOT 1.0 08/13/2022   ALKPHOS 49 08/13/2022   AST 15 08/13/2022   ALT 9 08/13/2022   ANIONGAP 11 08/13/2022    CBG (last 3)  Recent Labs    08/12/22 1139  GLUCAP 76      Coagulation Profile: No results for input(s): "INR", "PROTIME" in the last 168 hours.   Radiology Studies: I have personally reviewed the imaging studies  CT Head Wo Contrast  Result Date: 08/12/2022 CLINICAL  DATA:  Head trauma, minor (Age >= 65y); Neck trauma (Age >= 65y) EXAM: CT HEAD WITHOUT CONTRAST CT CERVICAL SPINE WITHOUT CONTRAST TECHNIQUE: Multidetector CT imaging of the head and cervical spine was performed following the standard protocol without intravenous contrast. Multiplanar CT image reconstructions of the cervical spine were also generated. RADIATION DOSE REDUCTION: This exam was performed according to the departmental dose-optimization program which includes automated exposure control,  adjustment of the mA and/or kV according to patient size and/or use of iterative reconstruction technique. COMPARISON:  03/09/2021. FINDINGS: CT HEAD FINDINGS Brain: No evidence of acute infarction, hemorrhage, hydrocephalus, extra-axial collection or mass lesion/mass effect. Patchy white matter hypoattenuation, nonspecific but compatible with chronic microvascular ischemic disease. Vascular: No hyperdense vessel identified. Skull: No acute fracture.  Right frontal craniotomy. Sinuses/Orbits: No acute findings. CT CERVICAL SPINE FINDINGS Alignment: Straightening.  No substantial sagittal subluxation. Skull base and vertebrae: Vertebral body heights are maintained. No evidence of acute fracture. Soft tissues and spinal canal: No prevertebral fluid or swelling. No visible canal hematoma. Disc levels:  Similar multilevel degenerative change. Upper chest: Visualized lung apices are clear. IMPRESSION: 1. No evidence of acute intracranial abnormality. 2. No evidence of acute fracture or traumatic malalignment in the cervical spine. Electronically Signed   By: Margaretha Sheffield M.D.   On: 08/12/2022 15:38   CT Cervical Spine Wo Contrast  Result Date: 08/12/2022 CLINICAL DATA:  Head trauma, minor (Age >= 65y); Neck trauma (Age >= 65y) EXAM: CT HEAD WITHOUT CONTRAST CT CERVICAL SPINE WITHOUT CONTRAST TECHNIQUE: Multidetector CT imaging of the head and cervical spine was performed following the standard protocol without  intravenous contrast. Multiplanar CT image reconstructions of the cervical spine were also generated. RADIATION DOSE REDUCTION: This exam was performed according to the departmental dose-optimization program which includes automated exposure control, adjustment of the mA and/or kV according to patient size and/or use of iterative reconstruction technique. COMPARISON:  03/09/2021. FINDINGS: CT HEAD FINDINGS Brain: No evidence of acute infarction, hemorrhage, hydrocephalus, extra-axial collection or mass lesion/mass effect. Patchy white matter hypoattenuation, nonspecific but compatible with chronic microvascular ischemic disease. Vascular: No hyperdense vessel identified. Skull: No acute fracture.  Right frontal craniotomy. Sinuses/Orbits: No acute findings. CT CERVICAL SPINE FINDINGS Alignment: Straightening.  No substantial sagittal subluxation. Skull base and vertebrae: Vertebral body heights are maintained. No evidence of acute fracture. Soft tissues and spinal canal: No prevertebral fluid or swelling. No visible canal hematoma. Disc levels:  Similar multilevel degenerative change. Upper chest: Visualized lung apices are clear. IMPRESSION: 1. No evidence of acute intracranial abnormality. 2. No evidence of acute fracture or traumatic malalignment in the cervical spine. Electronically Signed   By: Margaretha Sheffield M.D.   On: 08/12/2022 15:38   DG Tibia/Fibula Left Port  Result Date: 08/12/2022 CLINICAL DATA:  Falls. EXAM: PORTABLE LEFT TIBIA AND FIBULA - 2 VIEW COMPARISON:  None Available. FINDINGS: No definite fracture or dislocation is seen. IMPRESSION: Grossly normal left tibia and fibula. Electronically Signed   By: Marijo Conception M.D.   On: 08/12/2022 14:59   DG Pelvis Portable  Result Date: 08/12/2022 CLINICAL DATA:  Multiple falls. EXAM: PORTABLE PELVIS 1-2 VIEWS COMPARISON:  May 11, 2021. FINDINGS: There is no evidence of pelvic fracture or diastasis. No pelvic bone lesions are seen. IMPRESSION:  Negative. Electronically Signed   By: Marijo Conception M.D.   On: 08/12/2022 14:58   DG Chest Port 1 View  Result Date: 08/12/2022 CLINICAL DATA:  Multiple falls. EXAM: PORTABLE CHEST 1 VIEW COMPARISON:  March 09, 2021. FINDINGS: The heart size and mediastinal contours are within normal limits. Both lungs are clear. Right internal jugular Port-A-Cath is noted with distal tip in expected position of cavoatrial junction. The visualized skeletal structures are unremarkable. IMPRESSION: No active disease. Electronically Signed   By: Marijo Conception M.D.   On: 08/12/2022 14:57   DG Femur 1 View Left  Result Date: 08/12/2022 CLINICAL  DATA:  Fall. EXAM: LEFT FEMUR 1 VIEW COMPARISON:  None Available. FINDINGS: There is no evidence of fracture or other focal bone lesions. Soft tissues are unremarkable. IMPRESSION: Negative. Electronically Signed   By: Kerby Moors M.D.   On: 08/12/2022 14:56       Gaylin Bulthuis M.D. [facility] 08/13/2022, 12:47 PM  Available via Epic secure chat 7am-7pm After 7 pm, please refer to night coverage provider listed on amion.

## 2022-08-13 NOTE — TOC Initial Note (Signed)
Transition of Care [facility]) - Initial/Assessment Note    Patient Details  Name: Jessica [name] MRN: [id number] Date of Birth: 06-22-1951  [facility] CM/SW Contact:    Tom-Johnson, Renea Ee, RN Phone Number: 08/13/2022, 3:57 PM  Clinical Narrative:                  CM spoke with patient at bedside about needs for post hospital transition. Admitted for Failure to Thrive. Has significant Hx of Multiple Myeloma, Anemia, Neuropathy and Steroid Myopathy. Palliative consulted. Patient states she is from home and lives with her husband. Has one supportive son. Retired, does not drive ans her help does not permit. Husband transports to and from appointments.  Has a cane, walker and shower seat at home.  PCP is Koleen Nimrod, Walker Shadow, MD and uses [facility] on [address].  PT recommended HHPT. Patient declined stating she goes to out patient PT on [address] and would like to resume at discharge. States she does not want anyone to come to her home. Outpatient PT resumption of care order placed, info on AVS.  Husband to transport at discharge. CM will continue to follow as patient progresses with care towards discharge.        Expected Discharge Plan: [place] Barriers to Discharge: Continued Medical Work up   Patient Goals and CMS Choice Patient states their goals for this hospitalization and ongoing recovery are:: To return home CMS Medicare.gov Compare Post Acute Care list provided to:: Patient Choice offered to / list presented to : Patient      Expected Discharge Plan and Services   Discharge Planning Services: CM Consult Post Acute Care Choice: [place] arrangements for the past 2 months: Single Family Home                 DME Arranged: N/A DME Agency: NA       HH Arranged: Refused [place] Agency: NA        Prior Living Arrangements/Services Living arrangements for the past 2 months: Single Family Home Lives  with:: Spouse Patient language and need for interpreter reviewed:: Yes Do you feel safe going back to the place where you live?: Yes      Need for Family Participation in Patient Care: Yes (Comment) Care giver support system in place?: Yes (comment) Current home services: DME (Cane, walker, shower seat) Criminal Activity/Legal Involvement Pertinent to Current Situation/Hospitalization: No - Comment as needed  Activities of Daily Living Home Assistive Devices/Equipment: None ADL Screening (condition at time of admission) Patient's cognitive ability adequate to safely complete daily activities?: Yes Is the patient deaf or have difficulty hearing?: No Does the patient have difficulty seeing, even when wearing glasses/contacts?: No Does the patient have difficulty concentrating, remembering, or making decisions?: No Patient able to express need for assistance with ADLs?: Yes Does the patient have difficulty dressing or bathing?: Yes Independently performs ADLs?: No Communication: Independent Dressing (OT): Needs assistance Is this a change from baseline?: Pre-admission baseline Grooming: Needs assistance Is this a change from baseline?: Pre-admission baseline Feeding: Independent Bathing: Needs assistance Is this a change from baseline?: Pre-admission baseline Toileting: Needs assistance Is this a change from baseline?: Pre-admission baseline In/Out Bed: Needs assistance Is this a change from baseline?: Pre-admission baseline Walks in Home: Needs assistance Is this a change from baseline?: Pre-admission baseline Does the patient have difficulty walking or climbing stairs?: Yes Weakness of Legs: Both Weakness of Arms/Hands: None  Permission Sought/Granted Permission sought to share information with : Case Manager, Family Supports Permission granted to share information with : Yes, Verbal Permission Granted              Emotional Assessment Appearance:: Appears stated  age Attitude/Demeanor/Rapport: Engaged, Gracious Affect (typically observed): Accepting, Appropriate, Calm, Hopeful, Pleasant Orientation: : Oriented to Self, Oriented to Place, Oriented to  Time, Oriented to Situation Alcohol / Substance Use: Not Applicable Psych Involvement: No (comment)  Admission diagnosis:  Dehydration [E86.0] Failure to thrive in adult [R62.7] FTT (failure to thrive) in adult [R62.7] Patient Active Problem List   Diagnosis Date Noted   FTT (failure to thrive) in adult 08/12/2022   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   Yeast infection of the skin 05/18/2022   Drug-induced neutropenia (HCC) 06/23/2021   Acute back pain 05/25/2021   Recurrent falls 05/25/2021   Drug-induced skin rash 03/11/2021   Fall against sharp object, initial encounter 03/09/2021   Deficiency anemia 02/24/2021   Vitamin B12 deficiency 01/27/2021   Nausea with vomiting 12/31/2020   Physical debility 10/06/2020   Irritation of right eye 09/08/2020   Immunocompromised state due to drug therapy ([place]) 05/19/2020   Preventive measure 04/21/2020   Alopecia 08/21/2019   Hypomagnesemia 04/25/2019   Right sided temporal headache 04/13/2019   Goals of care, counseling/discussion 04/13/2019   Hypocalcemia 01/11/2019   Osteopenia 06/05/2018   Weight loss 04/14/2018   Elevated blood pressure reading in office without diagnosis of hypertension 01/12/2018   Diarrhea 10/13/2017   Postural dizziness 10/13/2017   Right ovarian cyst 05/13/2017   Chronic GERD 01/13/2017   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 08/10/2016   Hypokalemia 07/16/2016   Pneumonia 07/13/2016   Atypical chest pain 07/13/2016   Chest pain on breathing 07/12/2016   Mucositis due to antineoplastic therapy 07/09/2016   Candidal esophagitis ([place]) 04/30/2016   Herpes simplex 04/30/2016   Chronic kidney disease, stage III (moderate) (HCC) 03/12/2016   Hypokalemia, gastrointestinal losses 01/16/2016   Pancytopenia, acquired ([place]) 10/20/2015    Nasal congestion 10/20/2015   Varicose vein of leg 08/25/2015   S/P bone marrow transplant ([place]) 04/22/2015   Drug-induced hypotension 04/22/2015   Petechiae 02/14/2015   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   Essential hypertension 02/07/2015   Infection of eyelid 01/16/2015   Protein calorie malnutrition ([place]) 01/16/2015   Superficial thrombophlebitis of upper extremity 12/24/2014   Sty, external 12/24/2014   Other constipation 12/03/2014   Dehydration 12/02/2014   Weakness 12/02/2014   Multiple myeloma in relapse ([place]) 11/19/2014   Prerenal renal failure 11/19/2014   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[place]) 11/19/2014   Neuropathic pain of left flank 11/19/2014   PCP:  Kathalene Frames, MD Pharmacy:   [facility] [place], [address] AT [address] [place] [address] Phone: [phone] Fax: [phone]  [facility], [place] - [address] [place] [address] Phone: [phone] Fax: [phone]     Social Determinants of Health (SDOH) Social History: SDOH Screenings   Food Insecurity: No Food Insecurity (08/12/2022)  Housing: Low Risk  (08/12/2022)  Transportation Needs: No Transportation Needs (08/12/2022)  Utilities: Not At Risk (08/12/2022)  Tobacco Use: Low Risk  (08/12/2022)   SDOH Interventions: Transportation Interventions: Intervention Not Indicated, Inpatient TOC, Patient Resources (Friends/Family)   Readmission Risk Interventions     No data to display

## 2022-08-13 NOTE — Evaluation (Signed)
Physical Therapy Evaluation Patient Details Name: Jessica [name] MRN: [id number] DOB: 17-Jun-1951 Today's Date: 08/13/2022  History of Present Illness  Patient is a 72 y/o female who presents on 08/12/2022 with weakness, nausea, pain. Diagnosed with FTT and dehydration. 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  Clinical Impression  Patient presents with generalized weakness, impaired balance, fatigue, decreased activity tolerance and impaired mobility s/p above. Pt is from home with her spouse and reports the last 5/6 days pt has been mostly bedbound with short walking trips to the bathroom with assist from spouse. Prior to this, pt is a household ambulator using RW. Pt was supposed to be getting OPPT however unable to tolerate getting out of the house especially recently due to stairs/weakness. Today, pt requires Mod A for bed mobility and Min A of 2 for standing and taking a few steps to get to chair. Pt fatigues quickly. Reports her spouse can assist her at home. Recommend HHPT until strength/mobility improve so pt can tolerate leaving home to resume OPPT. Will follow acutely to maximize independence and mobility prior to return home.       Recommendations for follow up therapy are one component of a multi-disciplinary discharge planning process, led by the attending physician.  Recommendations may be updated based on patient status, additional functional criteria and insurance authorization.  Follow Up Recommendations Home health PT      Assistance Recommended at Discharge Frequent or constant Supervision/Assistance  Patient can return home with the following  A lot of help with walking and/or transfers;A lot of help with bathing/dressing/bathroom;Assistance with cooking/housework;Assist for transportation;Help with stairs or ramp for entrance     Equipment Recommendations None recommended by PT  Recommendations for Other Services       Functional Status Assessment Patient has had a recent decline in their functional status and demonstrates the ability to make significant improvements in function in a reasonable and predictable amount of time.     Precautions / Restrictions Precautions Precautions: Fall Restrictions Weight Bearing Restrictions: No      Mobility  Bed Mobility Overal bed mobility: Needs Assistance Bed Mobility: Supine to Sit     Supine to sit: HOB elevated, Mod assist, +2 for physical assistance     General bed mobility comments: Assist with LEs and scooting bottom to EOB, increased time.    Transfers Overall transfer level: Needs assistance Equipment used: Rolling walker (2 wheels) Transfers: Sit to/from Stand, Bed to chair/wheelchair/BSC Sit to Stand: Min assist, +2 physical assistance   Step pivot transfers: Min assist, +2 physical assistance, +2 safety/equipment       General transfer comment: ASsist to power to standing with cues for hand placement/technique, Stood from EOB x2, fell back onto bed on first attempt. Able to take a few steps to get to chair with assist for balance and RW management.    Ambulation/Gait               General Gait Details: Deferred  Stairs            Wheelchair Mobility    Modified Rankin (Stroke Patients Only)       Balance Overall balance assessment: History of Falls, Needs assistance Sitting-balance support: Feet supported, No upper extremity supported Sitting balance-Leahy Scale: Fair Sitting balance - Comments: close supervision for safety.   Standing balance support: During functional activity, Bilateral upper  extremity supported Standing balance-Leahy Scale: Poor Standing balance comment: Requires external support for standing                             Pertinent Vitals/Pain Pain Assessment Pain Assessment:  Faces Faces Pain Scale: No hurt    Home Living Family/patient expects to be discharged to:: Private residence Living Arrangements: Spouse/significant other Available Help at Discharge: Family;Available 24 hours/day Type of Home: House Home Access: Stairs to enter Entrance Stairs-Rails: None Entrance Stairs-Number of Steps: 1 or 2   Home Layout: Two level;Able to live on main level with bedroom/bathroom Home Equipment: Rolling Walker (2 wheels);Cane - single point      Prior Function Prior Level of Function : Needs assist       Physical Assist : Mobility (physical);ADLs (physical) Mobility (physical): Bed mobility;Transfers;Gait   Mobility Comments: Reports she has not really been OOB for the last 5/6 days except short walks to bathroom with spouse assist. Reports lots of falls. Was recently going to OPPT however difficulty getting out of the house. ADLs Comments: Assist, has been doing sink baths. No IADLs.     Hand Dominance        Extremity/Trunk Assessment   Upper Extremity Assessment Upper Extremity Assessment: Defer to OT evaluation;Generalized weakness    Lower Extremity Assessment Lower Extremity Assessment: Generalized weakness    Cervical / Trunk Assessment Cervical / Trunk Assessment: Kyphotic  Communication   Communication: No difficulties  Cognition Arousal/Alertness: Awake/alert Behavior During Therapy: WFL for tasks assessed/performed Overall Cognitive Status: Within Functional Limits for tasks assessed                                 General Comments: for basic mobility tasks.        General Comments      Exercises General Exercises - Lower Extremity Long Arc Quad: AROM, Both, 10 reps, Seated   Assessment/Plan    PT Assessment Patient needs continued PT services  PT Problem List Decreased strength;Decreased mobility;Decreased balance;Decreased activity tolerance       PT Treatment Interventions Therapeutic  activities;Gait training;Therapeutic exercise;Patient/family education;DME instruction;Balance training;Functional mobility training    PT Goals (Current goals can be found in the Care Plan section)  Acute Rehab PT Goals Patient Stated Goal: to feel better PT Goal Formulation: With patient Time For Goal Achievement: 08/27/22 Potential to Achieve Goals: Fair    Frequency Min 3X/week     Co-evaluation               AM-PAC PT "6 Clicks" Mobility  Outcome Measure Help needed turning from your back to your side while in a flat bed without using bedrails?: A Little Help needed moving from lying on your back to sitting on the side of a flat bed without using bedrails?: A Lot Help needed moving to and from a bed to a chair (including a wheelchair)?: A Lot Help needed standing up from a chair using your arms (e.g., wheelchair or bedside chair)?: A Lot Help needed to walk in hospital room?: Total Help needed climbing 3-5 steps with a railing? : Total 6 Click Score: 11    End of Session Equipment Utilized During Treatment: Gait belt Activity Tolerance: Patient limited by fatigue Patient left: in chair;with call bell/phone within reach;with chair alarm set Nurse Communication: Mobility status PT Visit Diagnosis: Muscle weakness (generalized) (M62.81);Unsteadiness on feet (R26.81);Difficulty in  walking, not elsewhere classified (R26.2)    Time: 1607-3710 PT Time Calculation (min) (ACUTE ONLY): 29 min   Charges:   PT Evaluation $PT Eval Moderate Complexity: 1 Mod PT Treatments $Therapeutic Activity: 8-22 mins        Marisa Severin, PT, DPT Acute Rehabilitation Services Secure chat preferred Office [phone]     Marguarite Arbour A Andi Mahaffy 08/13/2022, 11:52 AM

## 2022-08-14 DIAGNOSIS — Z7189 Other specified counseling: Secondary | ICD-10-CM | POA: Diagnosis not present

## 2022-08-14 DIAGNOSIS — E46 Unspecified protein-calorie malnutrition: Secondary | ICD-10-CM

## 2022-08-14 DIAGNOSIS — Z515 Encounter for palliative care: Secondary | ICD-10-CM | POA: Diagnosis not present

## 2022-08-14 DIAGNOSIS — E860 Dehydration: Secondary | ICD-10-CM | POA: Diagnosis not present

## 2022-08-14 DIAGNOSIS — R531 Weakness: Secondary | ICD-10-CM

## 2022-08-14 DIAGNOSIS — R627 Adult failure to thrive: Secondary | ICD-10-CM | POA: Diagnosis not present

## 2022-08-14 LAB — RESPIRATORY PANEL BY PCR

## 2022-08-14 MED ORDER — POTASSIUM CHLORIDE CRYS ER 20 MEQ PO TBCR
40.0000 meq | EXTENDED_RELEASE_TABLET | Freq: Once | ORAL | Status: AC
  Administered 2022-08-14: 40 meq via ORAL
  Filled 2022-08-14: qty 2

## 2022-08-14 NOTE — Consult Note (Signed)
Palliative Care Consult Note                                  Date: 08/14/2022   Patient Name: Jessica [name]  DOB: 1951/06/24  MRN: [id number]  Age / Sex: 72 y.o., female  PCP: Kathalene Frames, MD Referring Physician: Mariel Aloe, MD  Reason for Consultation: Establishing goals of care  HPI/Patient Profile: 72 y.o. 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 Patient was referred to ED by oncology due to concern for dehydration and for further evaluation. She was admitted on 08/12/2022 with failure to thrive/progressive debility, AKI, diffuse body pain, postural dizziness, multiple myeloma, and others.   PMT was consulted for [place] conversations.  Past Medical History:  Diagnosis Date   H/O stem cell transplant [facility]) 03/2015   [facility]   Multiple myeloma [facility]) 11/19/2014   Multiple myeloma ([place])    Multiple myeloma (H[facility])    MVP (mitral valve prolapse)    req prophylaxis   Other constipation 12/03/2014   Right ovarian cyst 10/16/15   16 mm simple cyst - ultrasound yearly.   Upper respiratory infection, acute 08/12/2015    Subjective:   This NP Walden Field reviewed medical records, received report from team, assessed the patient and then meet at the patient's bedside to discuss diagnosis, prognosis, GOC, EOL wishes disposition and options.  I met with the patient at the bedside.  She is sitting at the edge of the bed, dressed, appears ready for discharge.  She agreed to speak with me while waiting for discharge.   Concept of Palliative Care was introduced as specialized medical care for people and their families living with serious  illness.  If focuses on providing relief from the symptoms and stress of a serious illness.  The goal is to improve quality of life for both the patient and the family. Values and goals of care important to patient and family were attempted to be elicited.  Created space and opportunity for patient  and family to explore thoughts and feelings regarding current medical situation   Natural trajectory and current clinical status were discussed. Questions and concerns addressed. Patient  encouraged to call with questions or concerns.    Patient/Family Understanding of Illness: We had a discussion about her multiple myeloma where she had to stop treatment due to poor performance status.  We discussed her poor appetite and weakness felt to be due to mild steroid insufficiency and started on steroids by oncology.  Overall she feels that her appetite and energy are improving.  She states that she has been arranged for home health PT/OT and hopes to get stronger to be able to go to outpatient PT/OT rather than home health.  Life Review: She is married to her husband Marcello Moores and they live together.  Patient Values: Family  Goals: To go home with therapy for strength, possibly be able to restart treatment if her performance improves, full code/full scope of care  Today's Discussion: In addition to discussions described above we discussed the role of palliative care and overall support of her care given multiple chronic illnesses. I introduced palliative care  as specialized medical care for people living with serious illness. It focuses on support, symptom relief, and improving quality of life.  We discussed that this is available as an outpatient where they can come to her home and see her, typically once a month.  She is in agreement for this.  She is not sure if her husband would like somebody else coming into their home frequently.  I stated that if she decides that this is not for her then she can always  stop ongoing palliative involvement.  Given that she is near discharge and unable to contact social work in a timely fashion I did offer choice.  She said based on local services that care connection/hospice the [place] would be her choice.  I informed her that I would notify the social worker.  At that time the CNA arrived with a wheelchair to bring the patient outside for discharge. I provided emotional and general support through therapeutic listening, empathy, sharing of stories, and other techniques. I answered all questions and addressed all concerns to the best of my ability.  Review of Systems  Constitutional:  Positive for appetite change (improving) and fatigue (Improved).  Respiratory:  Negative for cough and shortness of breath.   Gastrointestinal:  Negative for nausea and vomiting.    Objective:   Primary Diagnoses: Present on Admission:  FTT (failure to thrive) in adult  Multiple myeloma in relapse ([place])  Protein calorie malnutrition ([place])  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   Physical Exam Vitals and nursing note reviewed.  Constitutional:      General: She is not in acute distress.    Appearance: She is ill-appearing.  HENT:     Head: Normocephalic and atraumatic.  Pulmonary:     Effort: Pulmonary effort is normal. No respiratory distress.  Skin:    General: Skin is warm and dry.  Neurological:     General: No focal deficit present.     Mental Status: She is alert.  Psychiatric:        Mood and Affect: Mood normal.        Behavior: Behavior normal.     Vital Signs:  BP (!) 147/87 (BP Location: Left Arm)   Pulse 76   Temp 98.2 F (36.8 C) (Oral)   Resp 18   Wt 63.1 kg   LMP 08/10/2003   SpO2 100%   BMI 27.17 kg/m   Palliative Assessment/Data: 60%    Advanced Care Planning:   Existing Vynca/ACP Documentation: MOST form (signed 12/27/2019): Full scope  Primary Decision Maker: PATIENT  Code Status/Advance Care Planning: Full code  A  discussion was had today regarding advanced directives. Concepts specific to code status, artifical feeding and hydration, continued IV antibiotics and rehospitalization was had.  The difference between a aggressive medical intervention path and a palliative comfort care path for this patient at this time was had.   Decisions/Changes to ACP: None today  Assessment & Plan:   Impression: 73 year old female with multiple chronic illnesses and acute presentations described above.  The patient rapidly improved during her hospitalization and is now ready for discharge.  Appetite and energy improving with steroid dosing.  Plans in place for steroid taper as an outpatient.  Recommend following up with oncology.  We have offered, and she is excepted, outpatient palliative care for ongoing support with multiple chronic illnesses including multiple myeloma work treatment had to be stopped due to poor performance.  She is hopeful to get stronger with home PT/OT to  be able to continue with outpatient PT/OT and possibly improve her performance status and restart treatment.  Overall prognosis is guarded to poor.  SUMMARY OF RECOMMENDATIONS   Remain DNR TOC consult for outpatient palliative care  Symptom Management:  Per primary team PMT is available to assist as needed  Prognosis:  Unable to determine  Discharge Planning:  Home with Home Health   Discussed with: Patient, medical team, nursing team, [facility] team    Thank you for allowing [place] to participate in the care of MELODYE SWOR PMT will continue to support holistically.  Time Total: 45 min  Greater than 50%  of this time was spent counseling and coordinating care related to the above assessment and plan.  Signed by: Walden Field, NP Palliative Medicine Team  Team Phone # [phone] (Nights/Weekends)  08/14/2022, 1:29 PM

## 2022-08-14 NOTE — Plan of Care (Signed)
  Problem: Education: Goal: Knowledge of General Education information will improve Description Including pain rating scale, medication(s)/side effects and non-pharmacologic comfort measures Outcome: Progressing   

## 2022-08-14 NOTE — Plan of Care (Signed)
  Problem: Education: Goal: Knowledge of General Education information will improve Description: Including pain rating scale, medication(s)/side effects and non-pharmacologic comfort measures 08/14/2022 1302 by Emmaline Life, RN Outcome: Adequate for Discharge 08/14/2022 1047 by Emmaline Life, RN Outcome: Progressing   Problem: Health Behavior/Discharge Planning: Goal: Ability to manage health-related needs will improve Outcome: Adequate for Discharge   Problem: Clinical Measurements: Goal: Ability to maintain clinical measurements within normal limits will improve Outcome: Adequate for Discharge Goal: Will remain free from infection Outcome: Adequate for Discharge Goal: Diagnostic test results will improve Outcome: Adequate for Discharge Goal: Respiratory complications will improve Outcome: Adequate for Discharge Goal: Cardiovascular complication will be avoided Outcome: Adequate for Discharge   Problem: Activity: Goal: Risk for activity intolerance will decrease Outcome: Adequate for Discharge   Problem: Nutrition: Goal: Adequate nutrition will be maintained Outcome: Adequate for Discharge   Problem: Coping: Goal: Level of anxiety will decrease Outcome: Adequate for Discharge   Problem: Elimination: Goal: Will not experience complications related to bowel motility Outcome: Adequate for Discharge Goal: Will not experience complications related to urinary retention Outcome: Adequate for Discharge   Problem: Pain Managment: Goal: General experience of comfort will improve Outcome: Adequate for Discharge   Problem: Safety: Goal: Ability to remain free from injury will improve Outcome: Adequate for Discharge   Problem: Skin Integrity: Goal: Risk for impaired skin integrity will decrease Outcome: Adequate for Discharge   Problem: Acute Rehab PT Goals(only PT should resolve) Goal: Pt Will Go Supine/Side To Sit Outcome: Adequate for Discharge Goal: Pt Will Go  Sit To Supine/Side Outcome: Adequate for Discharge Goal: Patient Will Transfer Sit To/From Stand Outcome: Adequate for Discharge Goal: Pt Will Ambulate Outcome: Adequate for Discharge Goal: Pt Will Go Up/Down Stairs Outcome: Adequate for Discharge

## 2022-08-14 NOTE — Discharge Instructions (Signed)
Jessica [name],  You were in the hospital with dehydration. You have improved with IV fluids. Please ensure your ability to continue to adequately hydrate yourself. Please follow-up with your PCP and oncologist.

## 2022-08-14 NOTE — Hospital Course (Signed)
Jessica [name] is a 72 y.o. female with a history of multiple myeloma, chronic anemia, neuropathy, steroid myopathy. Patient presented secondary to generalized weakness with concern for failure to thrive and found to have an AKI and signs of dehydration. IV fluids started with improvement of symptoms.

## 2022-08-14 NOTE — TOC Transition Note (Addendum)
Transition of Care [facility]) - CM/SW Discharge Note   Patient Details  Name: Jessica [name] MRN: [id number] Date of Birth: 12/04/1950  Transition of Care [facility] CM/SW Contact:  Bartholomew Crews, RN Phone Number: [phone] 08/14/2022, 12:55 PM   Clinical Narrative:     Spoke with patient on the hospital room phone to discuss post acute transition. Spouse was at the bedside visiting. Advise do DC order for today. Discussed HH PT - patient is now agreeable to [facility] Referral accepted by [facility]. HH PT/Face to Face order placed by provider. Spouse to provide transportation home. No further TOC needs identified at this time.   UPDATE: Notified by palliative NP that patient was agreeable to outpatient palliative. [facility] HH also able to f/u with palliative care needs.   Final next level of care: Home w Home Health Services Barriers to Discharge: No Barriers Identified   Patient Goals and CMS Choice CMS Medicare.gov Compare Post Acute Care list provided to:: Patient Choice offered to / list presented to : Patient  Discharge Placement                         Discharge Plan and Services Additional resources added to the After Visit Summary for     Discharge Planning Services: CM Consult Post Acute Care Choice: Home Health          DME Arranged: N/A DME Agency: NA       HH Arranged: PT HH Agency: Other - See comment ([place]) Date [place]: 08/14/22 Time [place]: 1017 Representative spoke with at [place]: [place] Determinants of Health ([place]) Interventions SDOH Screenings   Food Insecurity: No Food Insecurity (08/12/2022)  Housing: Low Risk  (08/12/2022)  Transportation Needs: No Transportation Needs (08/12/2022)  Utilities: Not At Risk (08/12/2022)  Tobacco Use: Low Risk  (08/12/2022)     Readmission Risk Interventions     No data to display

## 2022-08-14 NOTE — Discharge Summary (Signed)
Physician Discharge Summary   Patient: Jessica [name] MRN: [id number] DOB: 09-Jun-1951  Admit date:     08/12/2022  Discharge date: 08/14/22  Discharge Physician: Cordelia Poche, MD   PCP: Kathalene Frames, MD   Recommendations at discharge:  PCP and oncology follow-up Palliative care follow-up  Discharge Diagnoses: Principal Problem:   FTT (failure to thrive) in adult Active Problems:   Multiple myeloma in relapse ([place])   Protein calorie malnutrition ([place])   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  Resolved Problems:   * No resolved hospital problems. *  Hospital Course: Jessica [name] is a 72 y.o. female with a history of multiple myeloma, chronic anemia, neuropathy, steroid myopathy. Patient presented secondary to generalized weakness with concern for failure to thrive and found to have an AKI and signs of dehydration. IV fluids started with improvement of symptoms.  Assessment and Plan:  Progressive weakness Failure to thrive Presenting concerns. Acute issues appear to be secondary to dehydration. Weakness improved with IV fluids. Palliative care consulted for failure to thrive and patient will follow-up as an outpatient with palliative care services. Home health physical therapy ordered on discharge.  AKI Creatinine of 1.15 on admission. AKI resolved with IV fluids.  Multiple myeloma Patient follows with oncology. Palliative care consulted. Patient will follow-up with palliative care services as an outpatient. Continue decadron.  Anemia of chronic illness Hemoglobin stable.   Consultants: Palliative care medicine Procedures performed: None  Disposition: Home health Diet recommendation: Regular diet   DISCHARGE MEDICATION: Allergies as of 08/14/2022       Reactions   Augmentin [amoxicillin-pot Clavulanate] Hives, Itching, Nausea And Vomiting, Rash   ++Tolerates Cefepime++ Has patient had a PCN reaction causing immediate rash, facial/tongue/throat swelling, SOB or  lightheadedness with hypotension: Yes Has patient had a PCN reaction causing severe rash involving mucus membranes or skin necrosis: Yes Has patient had a PCN reaction that required hospitalization No Has patient had a PCN reaction occurring within the last 10 years: Yes If all of the above answers are "NO", then may proceed with Cephalosporin use. *okay to take other types of cillin   Albuterol Other (See Comments)   Elevated HR and BP   Codeine Nausea Only   Ibuprofen Nausea Only   Nsaids Other (See Comments)   Gi Upset   Tolmetin Other (See Comments)   Gi Upset   Tetanus-diphth-acell Pertussis Rash   Received injection in ER on 8/1, developed fine rash all over body. Cellulitis to injection site on left deltoid.        Medication List     TAKE these medications    acetaminophen 500 MG tablet Commonly known as: TYLENOL Take 500-1,000 mg by mouth every 6 (six) hours as needed for mild pain, moderate pain, fever or headache.   acyclovir 400 MG tablet Commonly known as: ZOVIRAX Take 1 tablet (400 mg total) by mouth daily.   calcium carbonate 500 MG chewable tablet Commonly known as: TUMS - dosed in mg elemental calcium Chew 1 tablet by mouth 2 (two) times daily as needed for indigestion.   CENTRUM SILVER PO Take 1 tablet by mouth daily.   cholecalciferol 25 MCG (1000 UNIT) tablet Commonly known as: VITAMIN D3 Take 2,000 Units by mouth daily.   cyanocobalamin 500 MCG tablet Commonly known as: VITAMIN B12 Take 500 mcg by mouth daily.   dexamethasone 4 MG tablet Commonly known as: DECADRON Take 1 tablet alternate with 1/2 tablet every other day What changed:  how much  to take how to take this when to take this   lidocaine-prilocaine cream Commonly known as: EMLA Apply to affected area once   loratadine 10 MG tablet Commonly known as: CLARITIN Take 10 mg by mouth daily as needed for allergies.   nystatin powder Commonly known as: MYCOSTATIN/NYSTOP Apply 1  Application topically 2 (two) times daily. What changed:  when to take this reasons to take this   omeprazole 20 MG capsule Commonly known as: PRILOSEC TAKE 1 CAPSULE(20 MG) BY MOUTH DAILY What changed:  how much to take how to take this when to take this additional instructions   ondansetron 8 MG tablet Commonly known as: ZOFRAN TAKE 1 TABLET(8 MG) BY MOUTH TWICE DAILY AS NEEDED FOR NAUSEA OR VOMITING What changed: See the new instructions.   prochlorperazine 10 MG tablet Commonly known as: COMPAZINE Take 1 tablet (10 mg total) by mouth every 6 (six) hours as needed (Nausea or vomiting).   senna-docusate 8.6-50 MG tablet Commonly known as: Senokot-S Take 1-2 tablets by mouth daily as needed for mild constipation or moderate constipation.   sodium chloride 0.65 % Soln nasal spray Commonly known as: OCEAN Place 1 spray into both nostrils as needed for congestion.        Follow-up Information     [facility] Follow up.   Specialty: Rehabilitation Why: Call to schedule resumption of care visit. Contact information: [address] [id number] mc [place]        Kathalene Frames, MD. Schedule an appointment as soon as possible for a visit in 1 week(s).   Specialty: Internal Medicine Why: For hospital follow-up Contact information: [address], [address] [address] [phone]                Discharge Exam: BP (!) 147/87 (BP Location: Left Arm)   Pulse 76   Temp 98.2 F (36.8 C) (Oral)   Resp 18   Wt 63.1 kg   LMP 08/10/2003   SpO2 100%   BMI 27.17 kg/m   General exam: Appears calm and comfortable Respiratory system: Clear to auscultation. Respiratory effort normal. Cardiovascular system: S1 & S2 heard, RRR. No murmurs, rubs, gallops or clicks. Gastrointestinal system: Abdomen is nondistended, soft and nontender. Normal bowel sounds  heard. Central nervous system: Alert and oriented. No focal neurological deficits. Musculoskeletal: No calf tenderness Skin: No cyanosis. No rashes Psychiatry: Judgement and insight appear normal. Mood & affect appropriate.   Condition at discharge: stable  The results of significant diagnostics from this hospitalization (including imaging, microbiology, ancillary and laboratory) are listed below for reference.   Imaging Studies: CT Head Wo Contrast  Result Date: 08/12/2022 CLINICAL DATA:  Head trauma, minor (Age >= 65y); Neck trauma (Age >= 65y) EXAM: CT HEAD WITHOUT CONTRAST CT CERVICAL SPINE WITHOUT CONTRAST TECHNIQUE: Multidetector CT imaging of the head and cervical spine was performed following the standard protocol without intravenous contrast. Multiplanar CT image reconstructions of the cervical spine were also generated. RADIATION DOSE REDUCTION: This exam was performed according to the departmental dose-optimization program which includes automated exposure control, adjustment of the mA and/or kV according to patient size and/or use of iterative reconstruction technique. COMPARISON:  03/09/2021. FINDINGS: CT HEAD FINDINGS Brain: No evidence of acute infarction, hemorrhage, hydrocephalus, extra-axial collection or mass lesion/mass effect. Patchy white matter hypoattenuation, nonspecific but compatible with chronic microvascular ischemic disease. Vascular: No hyperdense vessel identified. Skull: No acute fracture.  Right frontal craniotomy. Sinuses/Orbits: No  acute findings. CT CERVICAL SPINE FINDINGS Alignment: Straightening.  No substantial sagittal subluxation. Skull base and vertebrae: Vertebral body heights are maintained. No evidence of acute fracture. Soft tissues and spinal canal: No prevertebral fluid or swelling. No visible canal hematoma. Disc levels:  Similar multilevel degenerative change. Upper chest: Visualized lung apices are clear. IMPRESSION: 1. No evidence of acute intracranial  abnormality. 2. No evidence of acute fracture or traumatic malalignment in the cervical spine. Electronically Signed   By: Margaretha Sheffield M.D.   On: 08/12/2022 15:38   CT Cervical Spine Wo Contrast  Result Date: 08/12/2022 CLINICAL DATA:  Head trauma, minor (Age >= 65y); Neck trauma (Age >= 65y) EXAM: CT HEAD WITHOUT CONTRAST CT CERVICAL SPINE WITHOUT CONTRAST TECHNIQUE: Multidetector CT imaging of the head and cervical spine was performed following the standard protocol without intravenous contrast. Multiplanar CT image reconstructions of the cervical spine were also generated. RADIATION DOSE REDUCTION: This exam was performed according to the departmental dose-optimization program which includes automated exposure control, adjustment of the mA and/or kV according to patient size and/or use of iterative reconstruction technique. COMPARISON:  03/09/2021. FINDINGS: CT HEAD FINDINGS Brain: No evidence of acute infarction, hemorrhage, hydrocephalus, extra-axial collection or mass lesion/mass effect. Patchy white matter hypoattenuation, nonspecific but compatible with chronic microvascular ischemic disease. Vascular: No hyperdense vessel identified. Skull: No acute fracture.  Right frontal craniotomy. Sinuses/Orbits: No acute findings. CT CERVICAL SPINE FINDINGS Alignment: Straightening.  No substantial sagittal subluxation. Skull base and vertebrae: Vertebral body heights are maintained. No evidence of acute fracture. Soft tissues and spinal canal: No prevertebral fluid or swelling. No visible canal hematoma. Disc levels:  Similar multilevel degenerative change. Upper chest: Visualized lung apices are clear. IMPRESSION: 1. No evidence of acute intracranial abnormality. 2. No evidence of acute fracture or traumatic malalignment in the cervical spine. Electronically Signed   By: Margaretha Sheffield M.D.   On: 08/12/2022 15:38   DG Tibia/Fibula Left Port  Result Date: 08/12/2022 CLINICAL DATA:  Falls. EXAM:  PORTABLE LEFT TIBIA AND FIBULA - 2 VIEW COMPARISON:  None Available. FINDINGS: No definite fracture or dislocation is seen. IMPRESSION: Grossly normal left tibia and fibula. Electronically Signed   By: Marijo Conception M.D.   On: 08/12/2022 14:59   DG Pelvis Portable  Result Date: 08/12/2022 CLINICAL DATA:  Multiple falls. EXAM: PORTABLE PELVIS 1-2 VIEWS COMPARISON:  May 11, 2021. FINDINGS: There is no evidence of pelvic fracture or diastasis. No pelvic bone lesions are seen. IMPRESSION: Negative. Electronically Signed   By: Marijo Conception M.D.   On: 08/12/2022 14:58   DG Chest Port 1 View  Result Date: 08/12/2022 CLINICAL DATA:  Multiple falls. EXAM: PORTABLE CHEST 1 VIEW COMPARISON:  March 09, 2021. FINDINGS: The heart size and mediastinal contours are within normal limits. Both lungs are clear. Right internal jugular Port-A-Cath is noted with distal tip in expected position of cavoatrial junction. The visualized skeletal structures are unremarkable. IMPRESSION: No active disease. Electronically Signed   By: Marijo Conception M.D.   On: 08/12/2022 14:57   DG Femur 1 View Left  Result Date: 08/12/2022 CLINICAL DATA:  Fall. EXAM: LEFT FEMUR 1 VIEW COMPARISON:  None Available. FINDINGS: There is no evidence of fracture or other focal bone lesions. Soft tissues are unremarkable. IMPRESSION: Negative. Electronically Signed   By: Kerby Moors M.D.   On: 08/12/2022 14:56    Microbiology: Results for orders placed or performed during the hospital encounter of 08/12/22  Resp panel by  RT-PCR (RSV, Flu A&B, Covid) Anterior Nasal Swab     Status: None   Collection Time: 08/12/22 11:41 AM   Specimen: Anterior Nasal Swab  Result Value Ref Range Status   SARS Coronavirus 2 by RT PCR NEGATIVE NEGATIVE Final    Comment: (NOTE) SARS-CoV-2 target nucleic acids are NOT DETECTED.  The SARS-CoV-2 RNA is generally detectable in upper respiratory specimens during the acute phase of infection. The  lowest concentration of SARS-CoV-2 viral copies this assay can detect is 138 copies/mL. A negative result does not preclude SARS-Cov-2 infection and should not be used as the sole basis for treatment or other patient management decisions. A negative result may occur with  improper specimen collection/handling, submission of specimen other than nasopharyngeal swab, presence of viral mutation(s) within the areas targeted by this assay, and inadequate number of viral copies(<138 copies/mL). A negative result must be combined with clinical observations, patient history, and epidemiological information. The expected result is Negative.  Fact Sheet for Patients:  EntrepreneurPulse.com.au  Fact Sheet for Healthcare Providers:  IncredibleEmployment.be  This test is no t yet approved or cleared by the [place] FDA and  has been authorized for detection and/or diagnosis of SARS-CoV-2 by FDA under an Emergency Use Authorization (EUA). This EUA will remain  in effect (meaning this test can be used) for the duration of the COVID-19 declaration under Section 564(b)(1) of the Act, 21 U.S.C.section 360bbb-3(b)(1), unless the authorization is terminated  or revoked sooner.       Influenza A by PCR NEGATIVE NEGATIVE Final   Influenza B by PCR NEGATIVE NEGATIVE Final    Comment: (NOTE) The Xpert Xpress SARS-CoV-2/FLU/RSV plus assay is intended as an aid in the diagnosis of influenza from Nasopharyngeal swab specimens and should not be used as a sole basis for treatment. Nasal washings and aspirates are unacceptable for Xpert Xpress SARS-CoV-2/FLU/RSV testing.  Fact Sheet for Patients: EntrepreneurPulse.com.au  Fact Sheet for Healthcare Providers: IncredibleEmployment.be  This test is not yet approved or cleared by the [place] FDA and has been authorized for detection and/or diagnosis of SARS-CoV-2 by FDA under  an Emergency Use Authorization (EUA). This EUA will remain in effect (meaning this test can be used) for the duration of the COVID-19 declaration under Section 564(b)(1) of the Act, 21 U.S.C. section 360bbb-3(b)(1), unless the authorization is terminated or revoked.     Resp Syncytial Virus by PCR NEGATIVE NEGATIVE Final    Comment: (NOTE) Fact Sheet for Patients: EntrepreneurPulse.com.au  Fact Sheet for Healthcare Providers: IncredibleEmployment.be  This test is not yet approved or cleared by the [place] FDA and has been authorized for detection and/or diagnosis of SARS-CoV-2 by FDA under an Emergency Use Authorization (EUA). This EUA will remain in effect (meaning this test can be used) for the duration of the COVID-19 declaration under Section 564(b)(1) of the Act, 21 U.S.C. section 360bbb-3(b)(1), unless the authorization is terminated or revoked.  Performed at [facility], [address], [place], [place] [address]   Respiratory (~20 pathogens) panel by PCR     Status: None   Collection Time: 08/12/22 10:27 PM   Specimen: Nasopharyngeal Swab; Respiratory  Result Value Ref Range Status   Adenovirus NOT DETECTED NOT DETECTED Final   Coronavirus 229E NOT DETECTED NOT DETECTED Final    Comment: (NOTE) The Coronavirus on the Respiratory Panel, DOES NOT test for the novel  Coronavirus (2019 nCoV)    Coronavirus HKU1 NOT DETECTED NOT DETECTED Final   Coronavirus NL63 NOT  DETECTED NOT DETECTED Final   Coronavirus OC43 NOT DETECTED NOT DETECTED Final   Metapneumovirus NOT DETECTED NOT DETECTED Final   Rhinovirus / Enterovirus NOT DETECTED NOT DETECTED Final   Influenza A NOT DETECTED NOT DETECTED Final   Influenza B NOT DETECTED NOT DETECTED Final   Parainfluenza Virus 1 NOT DETECTED NOT DETECTED Final   Parainfluenza Virus 2 NOT DETECTED NOT DETECTED Final   Parainfluenza Virus 3 NOT DETECTED NOT DETECTED Final    Parainfluenza Virus 4 NOT DETECTED NOT DETECTED Final   Respiratory Syncytial Virus NOT DETECTED NOT DETECTED Final   Bordetella pertussis NOT DETECTED NOT DETECTED Final   Bordetella Parapertussis NOT DETECTED NOT DETECTED Final   Chlamydophila pneumoniae NOT DETECTED NOT DETECTED Final   Mycoplasma pneumoniae NOT DETECTED NOT DETECTED Final    Comment: Performed at [facility], [address], [place], [place] [address]    Labs: CBC: Recent Labs  Lab 08/12/22 1331 08/13/22 0758  WBC 12.6* 6.6  HGB 12.2 9.3*  HCT 39.5 28.9*  MCV 89.0 86.5  PLT 328 333   Basic Metabolic Panel: Recent Labs  Lab 08/12/22 1331 08/13/22 0758  NA 134* 136  K 4.1 3.4*  CL 95* 103  CO2 19* 22  GLUCOSE 98 64*  BUN 18 11  CREATININE 1.15* 0.85  CALCIUM 10.1 7.7*  MG  --  1.8  PHOS  --  2.7   Liver Function Tests: Recent Labs  Lab 08/12/22 1331 08/13/22 0758  AST 20 15  ALT 8 9  ALKPHOS 68 49  BILITOT 1.0 1.0  PROT 7.8 5.2*  ALBUMIN 4.0 2.4*   CBG: Recent Labs  Lab 08/12/22 1139  GLUCAP 76    Discharge time spent: 35 minutes.  Signed: Cordelia Poche, MD [facility] 08/14/2022

## 2022-08-23 ENCOUNTER — Telehealth: Payer: Self-pay

## 2022-08-23 NOTE — Telephone Encounter (Signed)
Returned her call. She is having a lot of urinary leakage since her hospital stay. She is concerned about tomorrow office visit. Told her to use a w/c when she arrives. She verbalized understanding.  Called [facility] hosp[facility] services per [facility] request. She is not sure why they keep calling her. Called home health at [phone] to verify the reason for the call. They are trying to come out thru home health for PT. She needs to see PCP. She is scheduled with PCP on 1/17 at 4 pm so the PCP will sign orders. Sharilyn Sites the above message and she verbalized understanding.

## 2022-08-24 ENCOUNTER — Encounter: Payer: Self-pay | Admitting: Hematology and Oncology

## 2022-08-24 ENCOUNTER — Inpatient Hospital Stay: Payer: Medicare Other | Admitting: Hematology and Oncology

## 2022-08-24 ENCOUNTER — Inpatient Hospital Stay: Payer: Medicare Other | Attending: Hematology and Oncology

## 2022-08-24 ENCOUNTER — Other Ambulatory Visit: Payer: Self-pay

## 2022-08-24 VITALS — BP 151/77 | HR 65 | Temp 97.6°F | Resp 18 | Ht 60.0 in | Wt 132.6 lb

## 2022-08-24 DIAGNOSIS — R42 Dizziness and giddiness: Secondary | ICD-10-CM | POA: Diagnosis not present

## 2022-08-24 DIAGNOSIS — R296 Repeated falls: Secondary | ICD-10-CM | POA: Insufficient documentation

## 2022-08-24 DIAGNOSIS — Z887 Allergy status to serum and vaccine status: Secondary | ICD-10-CM | POA: Insufficient documentation

## 2022-08-24 DIAGNOSIS — Z885 Allergy status to narcotic agent status: Secondary | ICD-10-CM | POA: Diagnosis not present

## 2022-08-24 DIAGNOSIS — T380X5A Adverse effect of glucocorticoids and synthetic analogues, initial encounter: Secondary | ICD-10-CM

## 2022-08-24 DIAGNOSIS — R531 Weakness: Secondary | ICD-10-CM | POA: Insufficient documentation

## 2022-08-24 DIAGNOSIS — Z923 Personal history of irradiation: Secondary | ICD-10-CM | POA: Insufficient documentation

## 2022-08-24 DIAGNOSIS — Z880 Allergy status to penicillin: Secondary | ICD-10-CM | POA: Insufficient documentation

## 2022-08-24 DIAGNOSIS — R9082 White matter disease, unspecified: Secondary | ICD-10-CM | POA: Insufficient documentation

## 2022-08-24 DIAGNOSIS — Z7961 Long term (current) use of immunomodulator: Secondary | ICD-10-CM | POA: Diagnosis not present

## 2022-08-24 DIAGNOSIS — R5381 Other malaise: Secondary | ICD-10-CM | POA: Diagnosis not present

## 2022-08-24 DIAGNOSIS — C9002 Multiple myeloma in relapse: Secondary | ICD-10-CM

## 2022-08-24 DIAGNOSIS — Z9221 Personal history of antineoplastic chemotherapy: Secondary | ICD-10-CM | POA: Insufficient documentation

## 2022-08-24 DIAGNOSIS — Z9181 History of falling: Secondary | ICD-10-CM | POA: Diagnosis not present

## 2022-08-24 DIAGNOSIS — Z7969 Long term (current) use of other immunomodulators and immunosuppressants: Secondary | ICD-10-CM | POA: Diagnosis not present

## 2022-08-24 DIAGNOSIS — G720 Drug-induced myopathy: Secondary | ICD-10-CM

## 2022-08-24 DIAGNOSIS — Z7401 Bed confinement status: Secondary | ICD-10-CM | POA: Insufficient documentation

## 2022-08-24 DIAGNOSIS — Z886 Allergy status to analgesic agent status: Secondary | ICD-10-CM | POA: Insufficient documentation

## 2022-08-24 DIAGNOSIS — Z881 Allergy status to other antibiotic agents status: Secondary | ICD-10-CM | POA: Diagnosis not present

## 2022-08-24 DIAGNOSIS — Z79899 Other long term (current) drug therapy: Secondary | ICD-10-CM | POA: Insufficient documentation

## 2022-08-24 DIAGNOSIS — Z5112 Encounter for antineoplastic immunotherapy: Secondary | ICD-10-CM | POA: Diagnosis present

## 2022-08-24 DIAGNOSIS — Z7952 Long term (current) use of systemic steroids: Secondary | ICD-10-CM | POA: Diagnosis not present

## 2022-08-24 DIAGNOSIS — J984 Other disorders of lung: Secondary | ICD-10-CM | POA: Insufficient documentation

## 2022-08-24 DIAGNOSIS — Z9481 Bone marrow transplant status: Secondary | ICD-10-CM

## 2022-08-24 LAB — CBC WITH DIFFERENTIAL/PLATELET
Abs Immature Granulocytes: 0.66 10*3/uL — ABNORMAL HIGH (ref 0.00–0.07)
Basophils Absolute: 0.1 10*3/uL (ref 0.0–0.1)
Basophils Relative: 1 %
Eosinophils Absolute: 0 10*3/uL (ref 0.0–0.5)
Eosinophils Relative: 0 %
HCT: 30 % — ABNORMAL LOW (ref 36.0–46.0)
Hemoglobin: 9.7 g/dL — ABNORMAL LOW (ref 12.0–15.0)
Immature Granulocytes: 5 %
Lymphocytes Relative: 30 %
Lymphs Abs: 3.8 10*3/uL (ref 0.7–4.0)
MCH: 27.7 pg (ref 26.0–34.0)
MCHC: 32.3 g/dL (ref 30.0–36.0)
MCV: 85.7 fL (ref 80.0–100.0)
Monocytes Absolute: 1 10*3/uL (ref 0.1–1.0)
Monocytes Relative: 8 %
Neutro Abs: 7.2 10*3/uL (ref 1.7–7.7)
Neutrophils Relative %: 56 %
Platelets: 368 10*3/uL (ref 150–400)
RBC: 3.5 MIL/uL — ABNORMAL LOW (ref 3.87–5.11)
RDW: 14 % (ref 11.5–15.5)
WBC: 12.7 10*3/uL — ABNORMAL HIGH (ref 4.0–10.5)
nRBC: 0 % (ref 0.0–0.2)

## 2022-08-24 LAB — COMPREHENSIVE METABOLIC PANEL
ALT: 7 U/L (ref 0–44)
AST: 8 U/L — ABNORMAL LOW (ref 15–41)
Albumin: 3.3 g/dL — ABNORMAL LOW (ref 3.5–5.0)
Alkaline Phosphatase: 49 U/L (ref 38–126)
Anion gap: 7 (ref 5–15)
BUN: 22 mg/dL (ref 8–23)
CO2: 25 mmol/L (ref 22–32)
Calcium: 8.6 mg/dL — ABNORMAL LOW (ref 8.9–10.3)
Chloride: 107 mmol/L (ref 98–111)
Creatinine, Ser: 0.75 mg/dL (ref 0.44–1.00)
GFR, Estimated: >60 mL/min (ref >60)
Glucose, Bld: 71 mg/dL (ref 70–99)
Potassium: 3.5 mmol/L (ref 3.5–5.1)
Sodium: 139 mmol/L (ref 135–145)
Total Bilirubin: 0.7 mg/dL (ref 0.3–1.2)
Total Protein: 6 g/dL — ABNORMAL LOW (ref 6.5–8.1)

## 2022-08-24 MED ORDER — HEPARIN SOD (PORK) LOCK FLUSH 100 UNIT/ML IV SOLN
500.0000 [IU] | Freq: Once | INTRAVENOUS | Status: AC
  Administered 2022-08-24: 500 [IU]

## 2022-08-24 MED ORDER — SODIUM CHLORIDE 0.9% FLUSH
10.0000 mL | Freq: Once | INTRAVENOUS | Status: AC
  Administered 2022-08-24: 10 mL

## 2022-08-24 NOTE — Assessment & Plan Note (Signed)
She has significant physical debility for the last 2 years, worse in the last 6 months Over the last year, she had recurrent falls and I have recommended physical therapy and rehab which she declined because of unpleasant experience and fear of doing PT Despite r multiple ecommendation for physical therapy and rehab, she has only attended 3 sessions over the past year Frankly, I do not believe home-based physical therapy will improve her condition Even at home, the patient did not continue on exercises that were prescribed by outpatient PT As above, I recommend consideration for palliative care and hospice If the husband is not able to take care of the patient, the patient may need to be readmitted for failure to thrive and long-term nursing home placement

## 2022-08-24 NOTE — Assessment & Plan Note (Signed)
She she has chronic postural dizziness and had extensive evaluation by cardiologist and neurologist Overall, her dizziness was likely caused by prior radiation therapy to her cranium  For now, she will continue dexamethasone for her symptoms

## 2022-08-24 NOTE — Assessment & Plan Note (Signed)
Her last myeloma panel showed stability Her performance status score has now declined to ECOG performance status score of 4 She is not a candidate for any form of treatment until her performance status score has improved Frankly, I do not believe she can improve to the point that she can start taking care of herself I recommend consideration for palliative care and hospice She has appointment to see her primary care doctor this week I will touch base with her husband next month for further follow-up

## 2022-08-24 NOTE — Progress Notes (Signed)
[facility] OFFICE PROGRESS NOTE  Patient Care Team: Kathalene Frames, MD as PCP - General (Internal Medicine) Buford Dresser, MD as PCP - Cardiology (Cardiology) Ginette Pitman, MD as Consulting Physician (Hematology and Oncology) Hessie Dibble, MD as Consulting Physician (Hematology and Oncology)  ASSESSMENT & PLAN:  Multiple myeloma in relapse [facility]) Her last myeloma panel showed stability Her performance status score has now declined to ECOG performance status score of 4 She is not a candidate for any form of treatment until her performance status score has improved Frankly, I do not believe she can improve to the point that she can start taking care of herself I recommend consideration for palliative care and hospice She has appointment to see her primary care doctor this week I will touch base with her husband next month for further follow-up  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 Her vital signs and energy level improved while she is on low-dose dexamethasone; however, I do believe the patient has some component of steroid-induced myopathy and weakness Unfortunately, she did not tolerate dexamethasone taper well She will continue dexamethasone at 4 mg alternate with 2 mg every other day for now  Postural dizziness She she has chronic postural dizziness and had extensive evaluation by cardiologist and neurologist Overall, her dizziness was likely caused by prior radiation therapy to her cranium  For now, she will continue dexamethasone for her symptoms  No orders of the defined types were placed in this encounter.   All questions were answered. The patient knows to call the clinic with any problems, questions or concerns. The total time spent in the appointment was 30 minutes encounter with patients including review of chart and various tests results, discussions about plan of care and coordination of care plan   [name], MD 08/24/2022 2:46 PM  INTERVAL HISTORY: Please see below for problem oriented charting. she returns for follow-up after recent hospitalization Her husband is present She was hospitalized for failure to thrive According to her husband, the patient is completely bedbound with poor oral intake prior to admission She received some IV fluid support and high-dose dexamethasone Since discharge from the hospital, she is completely bedbound She is also incontinence and urine She is very weak and dependent on her husband for all activities of daily living She does not do any therapy at home She continues to have chronic dizziness  REVIEW OF SYSTEMS:   Constitutional: Denies fevers, chills or abnormal weight loss Eyes: Denies blurriness of vision Ears, nose, mouth, throat, and face: Denies mucositis or sore throat Respiratory: Denies cough, dyspnea or wheezes Cardiovascular: Denies palpitation, chest discomfort or lower extremity swelling Gastrointestinal:  Denies nausea, heartburn or change in bowel habits Skin: Denies abnormal skin rashes Lymphatics: Denies new lymphadenopathy or easy bruising Behavioral/Psych: Mood is stable, no new changes  All other systems were reviewed with the patient and are negative.  I have reviewed the past medical history, past surgical history, social history and family history with the patient and they are unchanged from previous note.  ALLERGIES:  is allergic to augmentin [amoxicillin-pot clavulanate], albuterol, codeine, ibuprofen, nsaids, tolmetin, and tetanus-diphth-acell pertussis.  MEDICATIONS:  Current Outpatient Medications  Medication Sig Dispense Refill   acetaminophen (TYLENOL) 500 MG tablet Take 500-1,000 mg by mouth every 6 (six) hours as needed for mild pain, moderate pain, fever or headache.     acyclovir (ZOVIRAX) 400 MG tablet Take 1 tablet (400 mg total) by mouth daily. 30 tablet 3  calcium carbonate (TUMS - DOSED IN MG ELEMENTAL  CALCIUM) 500 MG chewable tablet Chew 1 tablet by mouth 2 (two) times daily as needed for indigestion.     cholecalciferol (VITAMIN D3) 25 MCG (1000 UNIT) tablet Take 2,000 Units by mouth daily.     dexamethasone (DECADRON) 4 MG tablet Take 1 tablet alternate with 1/2 tablet every other day (Patient taking differently: Take 2-4 mg by mouth See admin instructions. Take 1 tablet alternate with 1/2 tablet every other day) 60 tablet 0   lidocaine-prilocaine (EMLA) cream Apply to affected area once 30 g 3   loratadine (CLARITIN) 10 MG tablet Take 10 mg by mouth daily as needed for allergies.     Multiple Vitamins-Minerals (CENTRUM SILVER PO) Take 1 tablet by mouth daily.      nystatin (MYCOSTATIN/NYSTOP) powder Apply 1 Application topically 2 (two) times daily. (Patient taking differently: Apply 1 Application topically 2 (two) times daily as needed (rash).) 60 g 0   omeprazole (PRILOSEC) 20 MG capsule TAKE 1 CAPSULE(20 MG) BY MOUTH DAILY (Patient taking differently: Take 20 mg by mouth daily.) 90 capsule 11   ondansetron (ZOFRAN) 8 MG tablet TAKE 1 TABLET(8 MG) BY MOUTH TWICE DAILY AS NEEDED FOR NAUSEA OR VOMITING (Patient taking differently: Take 8 mg by mouth 2 (two) times daily as needed for nausea or vomiting.) 30 tablet 1   prochlorperazine (COMPAZINE) 10 MG tablet Take 1 tablet (10 mg total) by mouth every 6 (six) hours as needed (Nausea or vomiting). 30 tablet 1   senna-docusate (SENOKOT-S) 8.6-50 MG tablet Take 1-2 tablets by mouth daily as needed for mild constipation or moderate constipation.     sodium chloride (OCEAN) 0.65 % SOLN nasal spray Place 1 spray into both nostrils as needed for congestion.     vitamin B-12 (CYANOCOBALAMIN) 500 MCG tablet Take 500 mcg by mouth daily.     No current facility-administered medications for this visit.    SUMMARY OF ONCOLOGIC HISTORY: Oncology History Overview Note   M-protein 0.69 gm/dl IFIX - IgG, Kappa IgG - 868 IgA - 19 IgM - < 20 Kappa - 21 Lambda  - 5.7  09/06/2014 - Bone marrow aspirate and biopsy:   Normocellular marrow for age (40%) with a small monoclonal plasma cell population (1% on aspirate). Karyotype 41, XX  FISH Negative for myeloma associated changes  09/12/2014 - PET/CT  Two regions that are concerning for disease, one adjacent/involving the left ninth rib and one in the marrow of the right femur, in this patient with history of plasmacytoma.    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   04/01/2015 Bone Marrow Transplant   She received melphalan chemotherapy on 03/31/2015 followed by autologous stem cell transplant the day after   04/03/2015 - 04/18/2015 Hospital Admission   The patient was admitted to the hospital at [facility] for management related to complication from stem cell transplant. She had significant nausea requiring intravenous anti-emetics.   07/17/2015 -  Chemotherapy   She started maintenance Revlimid and monthly zometa, then every 3 months   06/01/2018 Imaging    DEXA scan showed bone density T score in femur -2.3   04/20/2019 PET scan   1. No FDG avid osseous lesions or mass identified to suggest metabolically active lesion of myeloma or plasmacytoma. 2. Small nodular density within the paravertebral right lower lobe exhibits mild to moderate increased uptake within SUV max of 3.38. This is indeterminate. Review of CT chest from 10/05/2018 shows a corresponding Lung nodule in this area measuring the same. Small pulmonary neoplasm cannot be excluded. 3. Indeterminate, focal area of increased uptake is identified within the thoracic canal. Indeterminate favored to represent benign physiologic CNS activity.     04/30/2019 - 01/21/2022 Chemotherapy   Patient is on Treatment Plan : MYELOMA SQ Daratumumab Faspro q28d     04/30/2019 - 07/09/2019 Chemotherapy   The patient had bortezomib SQ (VELCADE) chemo injection 1.75 mg, 1.3 mg/m2 = 1.75 mg, Subcutaneous,  Once, 10 of 11 cycles Administration: 1.75 mg (04/30/2019), 1.75 mg (05/07/2019), 1.75 mg (05/14/2019), 1.75 mg (05/21/2019), 1.75 mg (05/28/2019), 1.75 mg (06/04/2019), 1.75 mg (06/11/2019), 1.75 mg (06/18/2019), 1.75 mg (06/25/2019), 1.75 mg (07/09/2019)  for chemotherapy treatment.    03/05/2022 Procedure   Successful placement of a right IJ approach Power Port with ultrasound and fluoroscopic guidance. The catheter is ready for use   03/09/2022 - 04/06/2022 Chemotherapy   Patient is on Treatment Plan : MYELOMA RELAPSED/REFRACTORY Carfilzomib + Dexamethasone (Kd) weekly q28d     03/09/2022 - 06/01/2022 Chemotherapy   Patient is on Treatment Plan : MYELOMA RELAPSED/REFRACTORY Carfilzomib (20/70) D1,8,15 + Dexamethasone weekly (40) (Kd) q28d  x 9 cycles / Dexamethasone D1,8,15       PHYSICAL EXAMINATION: ECOG PERFORMANCE STATUS: 4 - Bedbound  Vitals:   08/24/22 1125  BP: (!) 151/77  Pulse: 65  Resp: 18  Temp: 97.6 F (36.4 C)  SpO2: 100%   Filed Weights   08/24/22 1125  Weight: 132 lb 9.6 oz (60.1  kg)    GENERAL:alert, no distress and comfortable.  She appears cushingoid NEURO: alert & oriented x 3 with fluent speech  LABORATORY DATA:  I have reviewed the data as listed    Component Value Date/Time   NA 139 08/24/2022 1025   NA 143 07/14/2017 1245   K 3.5 08/24/2022 1025   K 3.3 (L) 07/14/2017 1245   CL 107 08/24/2022 1025   CO2 25 08/24/2022 1025   CO2 20 (L) 07/14/2017 1245   GLUCOSE 71 08/24/2022 1025   GLUCOSE 106 07/14/2017 1245   BUN 22 08/24/2022 1025   BUN 9.3 07/14/2017 1245   CREATININE 0.75 08/24/2022 1025   CREATININE 0.89 06/15/2022 1124   CREATININE 0.8 07/14/2017 1245   CALCIUM 8.6 (L) 08/24/2022 1025   CALCIUM 8.4 07/14/2017 1245   PROT 6.0 (L) 08/24/2022 1025   PROT 6.0 07/14/2017 1245   PROT 6.2 (L) 07/14/2017 1245   ALBUMIN 3.3 (L) 08/24/2022 1025  ALBUMIN 3.6 07/14/2017 1245   AST 8 (L) 08/24/2022 1025   AST 11 (L) 06/15/2022 1124   AST 21 07/14/2017 1245   ALT 7 08/24/2022 1025   ALT 6 06/15/2022 1124   ALT 21 07/14/2017 1245   ALKPHOS 49 08/24/2022 1025   ALKPHOS 78 07/14/2017 1245   BILITOT 0.7 08/24/2022 1025   BILITOT 0.6 06/15/2022 1124   BILITOT 0.43 07/14/2017 1245   GFRNONAA >60 08/24/2022 1025   GFRNONAA >60 06/15/2022 1124   GFRAA 57 (L) 04/21/2020 0817   GFRAA >60 01/14/2020 0828    No results found for: "SPEP", "UPEP"  Lab Results  Component Value Date   WBC 12.7 (H) 08/24/2022   NEUTROABS 7.2 08/24/2022   HGB 9.7 (L) 08/24/2022   HCT 30.0 (L) 08/24/2022   MCV 85.7 08/24/2022   PLT 368 08/24/2022      Chemistry      Component Value Date/Time   NA 139 08/24/2022 1025   NA 143 07/14/2017 1245   K 3.5 08/24/2022 1025   K 3.3 (L) 07/14/2017 1245   CL 107 08/24/2022 1025   CO2 25 08/24/2022 1025   CO2 20 (L) 07/14/2017 1245   BUN 22 08/24/2022 1025   BUN 9.3 07/14/2017 1245   CREATININE 0.75 08/24/2022 1025   CREATININE 0.89 06/15/2022 1124   CREATININE 0.8 07/14/2017 1245      Component Value Date/Time    CALCIUM 8.6 (L) 08/24/2022 1025   CALCIUM 8.4 07/14/2017 1245   ALKPHOS 49 08/24/2022 1025   ALKPHOS 78 07/14/2017 1245   AST 8 (L) 08/24/2022 1025   AST 11 (L) 06/15/2022 1124   AST 21 07/14/2017 1245   ALT 7 08/24/2022 1025   ALT 6 06/15/2022 1124   ALT 21 07/14/2017 1245   BILITOT 0.7 08/24/2022 1025   BILITOT 0.6 06/15/2022 1124   BILITOT 0.43 07/14/2017 1245       RADIOGRAPHIC STUDIES: I have personally reviewed the radiological images as listed and agreed with the findings in the report. CT Head Wo Contrast  Result Date: 08/12/2022 CLINICAL DATA:  Head trauma, minor (Age >= 65y); Neck trauma (Age >= 65y) EXAM: CT HEAD WITHOUT CONTRAST CT CERVICAL SPINE WITHOUT CONTRAST TECHNIQUE: Multidetector CT imaging of the head and cervical spine was performed following the standard protocol without intravenous contrast. Multiplanar CT image reconstructions of the cervical spine were also generated. RADIATION DOSE REDUCTION: This exam was performed according to the departmental dose-optimization program which includes automated exposure control, adjustment of the mA and/or kV according to patient size and/or use of iterative reconstruction technique. COMPARISON:  03/09/2021. FINDINGS: CT HEAD FINDINGS Brain: No evidence of acute infarction, hemorrhage, hydrocephalus, extra-axial collection or mass lesion/mass effect. Patchy white matter hypoattenuation, nonspecific but compatible with chronic microvascular ischemic disease. Vascular: No hyperdense vessel identified. Skull: No acute fracture.  Right frontal craniotomy. Sinuses/Orbits: No acute findings. CT CERVICAL SPINE FINDINGS Alignment: Straightening.  No substantial sagittal subluxation. Skull base and vertebrae: Vertebral body heights are maintained. No evidence of acute fracture. Soft tissues and spinal canal: No prevertebral fluid or swelling. No visible canal hematoma. Disc levels:  Similar multilevel degenerative change. Upper chest:  Visualized lung apices are clear. IMPRESSION: 1. No evidence of acute intracranial abnormality. 2. No evidence of acute fracture or traumatic malalignment in the cervical spine. Electronically Signed   By: Margaretha Sheffield M.D.   On: 08/12/2022 15:38   CT Cervical Spine Wo Contrast  Result Date: 08/12/2022 CLINICAL DATA:  Head trauma,  minor (Age >= 65y); Neck trauma (Age >= 65y) EXAM: CT HEAD WITHOUT CONTRAST CT CERVICAL SPINE WITHOUT CONTRAST TECHNIQUE: Multidetector CT imaging of the head and cervical spine was performed following the standard protocol without intravenous contrast. Multiplanar CT image reconstructions of the cervical spine were also generated. RADIATION DOSE REDUCTION: This exam was performed according to the departmental dose-optimization program which includes automated exposure control, adjustment of the mA and/or kV according to patient size and/or use of iterative reconstruction technique. COMPARISON:  03/09/2021. FINDINGS: CT HEAD FINDINGS Brain: No evidence of acute infarction, hemorrhage, hydrocephalus, extra-axial collection or mass lesion/mass effect. Patchy white matter hypoattenuation, nonspecific but compatible with chronic microvascular ischemic disease. Vascular: No hyperdense vessel identified. Skull: No acute fracture.  Right frontal craniotomy. Sinuses/Orbits: No acute findings. CT CERVICAL SPINE FINDINGS Alignment: Straightening.  No substantial sagittal subluxation. Skull base and vertebrae: Vertebral body heights are maintained. No evidence of acute fracture. Soft tissues and spinal canal: No prevertebral fluid or swelling. No visible canal hematoma. Disc levels:  Similar multilevel degenerative change. Upper chest: Visualized lung apices are clear. IMPRESSION: 1. No evidence of acute intracranial abnormality. 2. No evidence of acute fracture or traumatic malalignment in the cervical spine. Electronically Signed   By: Margaretha Sheffield M.D.   On: 08/12/2022 15:38   DG  Tibia/Fibula Left Port  Result Date: 08/12/2022 CLINICAL DATA:  Falls. EXAM: PORTABLE LEFT TIBIA AND FIBULA - 2 VIEW COMPARISON:  None Available. FINDINGS: No definite fracture or dislocation is seen. IMPRESSION: Grossly normal left tibia and fibula. Electronically Signed   By: Marijo Conception M.D.   On: 08/12/2022 14:59   DG Pelvis Portable  Result Date: 08/12/2022 CLINICAL DATA:  Multiple falls. EXAM: PORTABLE PELVIS 1-2 VIEWS COMPARISON:  May 11, 2021. FINDINGS: There is no evidence of pelvic fracture or diastasis. No pelvic bone lesions are seen. IMPRESSION: Negative. Electronically Signed   By: Marijo Conception M.D.   On: 08/12/2022 14:58   DG Chest Port 1 View  Result Date: 08/12/2022 CLINICAL DATA:  Multiple falls. EXAM: PORTABLE CHEST 1 VIEW COMPARISON:  March 09, 2021. FINDINGS: The heart size and mediastinal contours are within normal limits. Both lungs are clear. Right internal jugular Port-A-Cath is noted with distal tip in expected position of cavoatrial junction. The visualized skeletal structures are unremarkable. IMPRESSION: No active disease. Electronically Signed   By: Marijo Conception M.D.   On: 08/12/2022 14:57   DG Femur 1 View Left  Result Date: 08/12/2022 CLINICAL DATA:  Fall. EXAM: LEFT FEMUR 1 VIEW COMPARISON:  None Available. FINDINGS: There is no evidence of fracture or other focal bone lesions. Soft tissues are unremarkable. IMPRESSION: Negative. Electronically Signed   By: Kerby Moors M.D.   On: 08/12/2022 14:56

## 2022-08-24 NOTE — Assessment & Plan Note (Signed)
She has severe steroid-induced myopathy Her vital signs and energy level improved while she is on low-dose dexamethasone; however, I do believe the patient has some component of steroid-induced myopathy and weakness Unfortunately, she did not tolerate dexamethasone taper well She will continue dexamethasone at 4 mg alternate with 2 mg every other day for now

## 2022-08-25 LAB — KAPPA/LAMBDA LIGHT CHAINS
Kappa free light chain: 37.1 mg/L — ABNORMAL HIGH (ref 3.3–19.4)
Kappa, lambda light chain ratio: 19.53 — ABNORMAL HIGH (ref 0.26–1.65)
Lambda free light chains: 1.9 mg/L — ABNORMAL LOW (ref 5.7–26.3)

## 2022-08-30 LAB — MULTIPLE MYELOMA PANEL, SERUM
Albumin SerPl Elph-Mcnc: 3.1 g/dL (ref 2.9–4.4)
Albumin/Glob SerPl: 1.2 (ref 0.7–1.7)
Alpha 1: 0.3 g/dL (ref 0.0–0.4)
Alpha2 Glob SerPl Elph-Mcnc: 0.9 g/dL (ref 0.4–1.0)
B-Globulin SerPl Elph-Mcnc: 0.9 g/dL (ref 0.7–1.3)
Gamma Glob SerPl Elph-Mcnc: 0.7 g/dL (ref 0.4–1.8)
Globulin, Total: 2.7 g/dL (ref 2.2–3.9)
IgA: 10 mg/dL — ABNORMAL LOW (ref 64–422)
IgG (Immunoglobin G), Serum: 765 mg/dL (ref 586–1602)
IgM (Immunoglobulin M), Srm: 12 mg/dL — ABNORMAL LOW (ref 26–217)
M Protein SerPl Elph-Mcnc: 0.5 g/dL — ABNORMAL HIGH
Total Protein ELP: 5.8 g/dL — ABNORMAL LOW (ref 6.0–8.5)

## 2022-08-31 ENCOUNTER — Other Ambulatory Visit: Payer: Self-pay | Admitting: Hematology and Oncology

## 2022-09-08 ENCOUNTER — Telehealth: Payer: Self-pay

## 2022-09-08 NOTE — Telephone Encounter (Signed)
-----  Message from [name], MD sent at 09/08/2022  9:56 AM EST ----- Can you call her husband Patrick Jupiter and ask how is Jacqlyn Larsen doing? Anything we can do to help?

## 2022-09-08 NOTE — Telephone Encounter (Signed)
Called [place] to check on Jessica [name]. She is about the same. She saw her PCP last week. Orders sent to home health for PT, OT and social worker. They are still waiting on home health to admit. Called home health and they will call Jessica [name] to set up a time to admit. Rozann Lesches the phone # to call home health if needed.  They appreciated the call and will call the office if needed.

## 2022-10-07 ENCOUNTER — Telehealth: Payer: Self-pay

## 2022-10-07 NOTE — Telephone Encounter (Signed)
Called husband to see how Jessica [name] is doing. She is getting PT and OT now. Home health nurse is seeing her. She is getting medical equipment in the home. A Education officer, museum from home health is assisting. Marcello Moores will call the office back for questions/ concerns or needs. He appreciated the call.

## 2022-10-09 ENCOUNTER — Other Ambulatory Visit: Payer: Self-pay | Admitting: Hematology and Oncology

## 2022-10-09 DIAGNOSIS — Z7189 Other specified counseling: Secondary | ICD-10-CM

## 2022-10-09 DIAGNOSIS — Z9481 Bone marrow transplant status: Secondary | ICD-10-CM

## 2022-10-09 DIAGNOSIS — C9002 Multiple myeloma in relapse: Secondary | ICD-10-CM

## 2022-11-11 ENCOUNTER — Telehealth: Payer: Self-pay

## 2022-11-11 NOTE — Telephone Encounter (Signed)
-----   Message from [name], MD sent at 11/11/2022 10:10 AM EDT ----- Can you call her husband and see how she is doing and whether she needs anything

## 2022-11-11 NOTE — Telephone Encounter (Signed)
Called and spoke with husband. Jessica [name] is the same and Marcello Moores is doing everything for her now. She has home health and PT a couple times a day. She just got w/c and they are getting ready to w/c ramp built. She is not eating much and does not leave the house. She does not take much medication. Marcello Moores said that they did not need anything at the moment. He appreciated the call and will tell Jessica [name] about the call.

## 2022-11-30 ENCOUNTER — Other Ambulatory Visit: Payer: Self-pay | Admitting: Hematology and Oncology

## 2022-12-05 ENCOUNTER — Encounter (HOSPITAL_COMMUNITY): Payer: Self-pay | Admitting: Internal Medicine

## 2022-12-05 ENCOUNTER — Other Ambulatory Visit: Payer: Self-pay

## 2022-12-05 ENCOUNTER — Emergency Department (HOSPITAL_COMMUNITY): Payer: Medicare Other

## 2022-12-05 ENCOUNTER — Inpatient Hospital Stay (HOSPITAL_COMMUNITY)
Admission: EM | Admit: 2022-12-05 | Discharge: 2022-12-13 | DRG: 175 | Disposition: A | Discharge status: Hospice | Payer: Medicare Other | Attending: Family Medicine | Admitting: Family Medicine

## 2022-12-05 DIAGNOSIS — Z888 Allergy status to other drugs, medicaments and biological substances status: Secondary | ICD-10-CM

## 2022-12-05 DIAGNOSIS — G9341 Metabolic encephalopathy: Secondary | ICD-10-CM | POA: Diagnosis present

## 2022-12-05 DIAGNOSIS — E43 Unspecified severe protein-calorie malnutrition: Secondary | ICD-10-CM | POA: Diagnosis present

## 2022-12-05 DIAGNOSIS — I2693 Single subsegmental pulmonary embolism without acute cor pulmonale: Secondary | ICD-10-CM | POA: Diagnosis present

## 2022-12-05 DIAGNOSIS — E538 Deficiency of other specified B group vitamins: Secondary | ICD-10-CM | POA: Diagnosis present

## 2022-12-05 DIAGNOSIS — E876 Hypokalemia: Secondary | ICD-10-CM | POA: Diagnosis not present

## 2022-12-05 DIAGNOSIS — D638 Anemia in other chronic diseases classified elsewhere: Secondary | ICD-10-CM | POA: Diagnosis present

## 2022-12-05 DIAGNOSIS — Z9841 Cataract extraction status, right eye: Secondary | ICD-10-CM

## 2022-12-05 DIAGNOSIS — Z961 Presence of intraocular lens: Secondary | ICD-10-CM | POA: Diagnosis present

## 2022-12-05 DIAGNOSIS — G729 Myopathy, unspecified: Secondary | ICD-10-CM | POA: Diagnosis present

## 2022-12-05 DIAGNOSIS — Z6825 Body mass index (BMI) 25.0-25.9, adult: Secondary | ICD-10-CM

## 2022-12-05 DIAGNOSIS — E871 Hypo-osmolality and hyponatremia: Secondary | ICD-10-CM | POA: Diagnosis present

## 2022-12-05 DIAGNOSIS — E46 Unspecified protein-calorie malnutrition: Secondary | ICD-10-CM | POA: Insufficient documentation

## 2022-12-05 DIAGNOSIS — Z9842 Cataract extraction status, left eye: Secondary | ICD-10-CM

## 2022-12-05 DIAGNOSIS — E8809 Other disorders of plasma-protein metabolism, not elsewhere classified: Secondary | ICD-10-CM | POA: Diagnosis present

## 2022-12-05 DIAGNOSIS — Z886 Allergy status to analgesic agent status: Secondary | ICD-10-CM

## 2022-12-05 DIAGNOSIS — W1830XA Fall on same level, unspecified, initial encounter: Secondary | ICD-10-CM | POA: Diagnosis present

## 2022-12-05 DIAGNOSIS — C9000 Multiple myeloma not having achieved remission: Secondary | ICD-10-CM | POA: Diagnosis present

## 2022-12-05 DIAGNOSIS — Z885 Allergy status to narcotic agent status: Secondary | ICD-10-CM

## 2022-12-05 DIAGNOSIS — I700 Atherosclerosis of aorta: Secondary | ICD-10-CM | POA: Diagnosis present

## 2022-12-05 DIAGNOSIS — R627 Adult failure to thrive: Secondary | ICD-10-CM | POA: Diagnosis not present

## 2022-12-05 DIAGNOSIS — Z833 Family history of diabetes mellitus: Secondary | ICD-10-CM

## 2022-12-05 DIAGNOSIS — Z8262 Family history of osteoporosis: Secondary | ICD-10-CM

## 2022-12-05 DIAGNOSIS — I2699 Other pulmonary embolism without acute cor pulmonale: Principal | ICD-10-CM | POA: Diagnosis present

## 2022-12-05 DIAGNOSIS — Z66 Do not resuscitate: Secondary | ICD-10-CM | POA: Diagnosis present

## 2022-12-05 DIAGNOSIS — K760 Fatty (change of) liver, not elsewhere classified: Secondary | ICD-10-CM | POA: Diagnosis present

## 2022-12-05 DIAGNOSIS — R112 Nausea with vomiting, unspecified: Secondary | ICD-10-CM

## 2022-12-05 DIAGNOSIS — W19XXXA Unspecified fall, initial encounter: Secondary | ICD-10-CM | POA: Diagnosis present

## 2022-12-05 DIAGNOSIS — Z880 Allergy status to penicillin: Secondary | ICD-10-CM

## 2022-12-05 DIAGNOSIS — Z515 Encounter for palliative care: Secondary | ICD-10-CM

## 2022-12-05 DIAGNOSIS — I2694 Multiple subsegmental pulmonary emboli without acute cor pulmonale: Principal | ICD-10-CM

## 2022-12-05 DIAGNOSIS — S81812A Laceration without foreign body, left lower leg, initial encounter: Secondary | ICD-10-CM | POA: Diagnosis present

## 2022-12-05 DIAGNOSIS — R531 Weakness: Secondary | ICD-10-CM | POA: Diagnosis not present

## 2022-12-05 DIAGNOSIS — S80812A Abrasion, left lower leg, initial encounter: Secondary | ICD-10-CM | POA: Diagnosis present

## 2022-12-05 DIAGNOSIS — Y929 Unspecified place or not applicable: Secondary | ICD-10-CM

## 2022-12-05 DIAGNOSIS — Z9484 Stem cells transplant status: Secondary | ICD-10-CM

## 2022-12-05 DIAGNOSIS — T380X5A Adverse effect of glucocorticoids and synthetic analogues, initial encounter: Secondary | ICD-10-CM | POA: Diagnosis present

## 2022-12-05 DIAGNOSIS — E162 Hypoglycemia, unspecified: Secondary | ICD-10-CM | POA: Diagnosis present

## 2022-12-05 DIAGNOSIS — Z7901 Long term (current) use of anticoagulants: Secondary | ICD-10-CM

## 2022-12-05 DIAGNOSIS — G720 Drug-induced myopathy: Secondary | ICD-10-CM | POA: Diagnosis present

## 2022-12-05 DIAGNOSIS — G629 Polyneuropathy, unspecified: Secondary | ICD-10-CM | POA: Diagnosis present

## 2022-12-05 DIAGNOSIS — E7220 Disorder of urea cycle metabolism, unspecified: Secondary | ICD-10-CM | POA: Diagnosis present

## 2022-12-05 DIAGNOSIS — R296 Repeated falls: Secondary | ICD-10-CM | POA: Diagnosis present

## 2022-12-05 DIAGNOSIS — K219 Gastro-esophageal reflux disease without esophagitis: Secondary | ICD-10-CM | POA: Diagnosis present

## 2022-12-05 LAB — COMPREHENSIVE METABOLIC PANEL
ALT: 9 U/L (ref 0–44)
AST: 28 U/L (ref 15–41)
Albumin: 2.8 g/dL — ABNORMAL LOW (ref 3.5–5.0)
Alkaline Phosphatase: 62 U/L (ref 38–126)
Anion gap: 16 — ABNORMAL HIGH (ref 5–15)
BUN: <5 mg/dL — ABNORMAL LOW (ref 8–23)
CO2: 19 mmol/L — ABNORMAL LOW (ref 22–32)
Calcium: 8.6 mg/dL — ABNORMAL LOW (ref 8.9–10.3)
Chloride: 96 mmol/L — ABNORMAL LOW (ref 98–111)
Creatinine, Ser: 0.75 mg/dL (ref 0.44–1.00)
GFR, Estimated: >60 mL/min (ref >60)
Glucose, Bld: 77 mg/dL (ref 70–99)
Potassium: 2.6 mmol/L — CL (ref 3.5–5.1)
Sodium: 131 mmol/L — ABNORMAL LOW (ref 135–145)
Total Bilirubin: 2.2 mg/dL — ABNORMAL HIGH (ref 0.3–1.2)
Total Protein: 8.2 g/dL — ABNORMAL HIGH (ref 6.5–8.1)

## 2022-12-05 LAB — I-STAT CHEM 8, ED
BUN: <3 mg/dL — ABNORMAL LOW (ref 8–23)
Calcium, Ion: 1.07 mmol/L — ABNORMAL LOW (ref 1.15–1.40)
Chloride: 96 mmol/L — ABNORMAL LOW (ref 98–111)
Creatinine, Ser: 0.6 mg/dL (ref 0.44–1.00)
Glucose, Bld: 89 mg/dL (ref 70–99)
HCT: 35 % — ABNORMAL LOW (ref 36.0–46.0)
Hemoglobin: 11.9 g/dL — ABNORMAL LOW (ref 12.0–15.0)
Potassium: 2.5 mmol/L — CL (ref 3.5–5.1)
Sodium: 135 mmol/L (ref 135–145)
TCO2: 22 mmol/L (ref 22–32)

## 2022-12-05 LAB — GLUCOSE, CAPILLARY
Glucose-Capillary: 131 mg/dL — ABNORMAL HIGH (ref 70–99)
Glucose-Capillary: 65 mg/dL — ABNORMAL LOW (ref 70–99)
Glucose-Capillary: 69 mg/dL — ABNORMAL LOW (ref 70–99)

## 2022-12-05 LAB — TSH: TSH: 0.645 u[IU]/mL (ref 0.350–4.500)

## 2022-12-05 LAB — TROPONIN I (HIGH SENSITIVITY)
Troponin I (High Sensitivity): 9 ng/L (ref <18)
Troponin I (High Sensitivity): 20 ng/L — ABNORMAL HIGH (ref <18)

## 2022-12-05 LAB — CBC WITH DIFFERENTIAL/PLATELET
Abs Immature Granulocytes: 0.29 10*3/uL — ABNORMAL HIGH (ref 0.00–0.07)
Basophils Absolute: 0.1 10*3/uL (ref 0.0–0.1)
Basophils Relative: 1 %
Eosinophils Absolute: 0 10*3/uL (ref 0.0–0.5)
Eosinophils Relative: 0 %
HCT: 33.3 % — ABNORMAL LOW (ref 36.0–46.0)
Hemoglobin: 10.6 g/dL — ABNORMAL LOW (ref 12.0–15.0)
Immature Granulocytes: 3 %
Lymphocytes Relative: 60 %
Lymphs Abs: 5 10*3/uL — ABNORMAL HIGH (ref 0.7–4.0)
MCH: 28 pg (ref 26.0–34.0)
MCHC: 31.8 g/dL (ref 30.0–36.0)
MCV: 87.9 fL (ref 80.0–100.0)
Monocytes Absolute: 0.7 10*3/uL (ref 0.1–1.0)
Monocytes Relative: 8 %
Neutro Abs: 2.3 10*3/uL (ref 1.7–7.7)
Neutrophils Relative %: 28 %
Platelets: 260 10*3/uL (ref 150–400)
RBC: 3.79 MIL/uL — ABNORMAL LOW (ref 3.87–5.11)
RDW: 22.3 % — ABNORMAL HIGH (ref 11.5–15.5)
WBC: 8.4 10*3/uL (ref 4.0–10.5)
nRBC: 0 % (ref 0.0–0.2)

## 2022-12-05 LAB — PROTIME-INR
INR: 1 (ref 0.8–1.2)
Prothrombin Time: 13.3 seconds (ref 11.4–15.2)

## 2022-12-05 LAB — LACTIC ACID, PLASMA
Lactic Acid, Venous: 1.5 mmol/L (ref 0.5–1.9)
Lactic Acid, Venous: 1.4 mmol/L (ref 0.5–1.9)

## 2022-12-05 LAB — CBG MONITORING, ED
Glucose-Capillary: 66 mg/dL — ABNORMAL LOW (ref 70–99)
Glucose-Capillary: 56 mg/dL — ABNORMAL LOW (ref 70–99)
Glucose-Capillary: 68 mg/dL — ABNORMAL LOW (ref 70–99)

## 2022-12-05 LAB — AMMONIA: Ammonia: 36 umol/L — ABNORMAL HIGH (ref 9–35)

## 2022-12-05 LAB — LIPASE, BLOOD: Lipase: 26 U/L (ref 11–51)

## 2022-12-05 LAB — MAGNESIUM: Magnesium: 1.8 mg/dL (ref 1.7–2.4)

## 2022-12-05 MED ORDER — DEXAMETHASONE 2 MG PO TABS: 2.0000 mg | ORAL_TABLET | ORAL | Status: DC

## 2022-12-05 MED ORDER — FENTANYL CITRATE PF 50 MCG/ML IJ SOSY
25.0000 ug | PREFILLED_SYRINGE | INTRAMUSCULAR | Status: DC | PRN
  Administered 2022-12-05 – 2022-12-13 (×6): 25 ug via INTRAVENOUS
  Filled 2022-12-05 (×8): qty 1

## 2022-12-05 MED ORDER — METOCLOPRAMIDE HCL 5 MG/ML IJ SOLN
10.0000 mg | Freq: Once | INTRAMUSCULAR | Status: AC
  Administered 2022-12-05: 10 mg via INTRAVENOUS
  Filled 2022-12-05: qty 2

## 2022-12-05 MED ORDER — HEPARIN (PORCINE) 25000 UT/250ML-% IV SOLN
800.0000 [IU]/h | INTRAVENOUS | Status: AC
  Administered 2022-12-05: 900 [IU]/h via INTRAVENOUS
  Administered 2022-12-06: 850 [IU]/h via INTRAVENOUS
  Administered 2022-12-08: 700 [IU]/h via INTRAVENOUS
  Administered 2022-12-09: 800 [IU]/h via INTRAVENOUS
  Filled 2022-12-05 (×4): qty 250

## 2022-12-05 MED ORDER — ONDANSETRON HCL 4 MG/2ML IJ SOLN
4.0000 mg | Freq: Four times a day (QID) | INTRAMUSCULAR | Status: DC | PRN
  Administered 2022-12-06: 4 mg via INTRAVENOUS
  Filled 2022-12-05: qty 2

## 2022-12-05 MED ORDER — SODIUM CHLORIDE 0.9 % IV SOLN: INTRAVENOUS | Status: DC | PRN

## 2022-12-05 MED ORDER — ONDANSETRON HCL 4 MG PO TABS: 4.0000 mg | ORAL_TABLET | Freq: Four times a day (QID) | ORAL | Status: DC | PRN

## 2022-12-05 MED ORDER — LACTULOSE 10 GM/15ML PO SOLN
20.0000 g | Freq: Three times a day (TID) | ORAL | Status: AC
  Administered 2022-12-06: 20 g via ORAL
  Filled 2022-12-05 (×2): qty 30

## 2022-12-05 MED ORDER — DEXTROSE-NACL 5-0.9 % IV SOLN: INTRAVENOUS | Status: AC

## 2022-12-05 MED ORDER — POTASSIUM CHLORIDE 10 MEQ/100ML IV SOLN
10.0000 meq | INTRAVENOUS | Status: AC
  Administered 2022-12-05 (×6): 10 meq via INTRAVENOUS
  Filled 2022-12-05 (×6): qty 100

## 2022-12-05 MED ORDER — HEPARIN BOLUS VIA INFUSION
3000.0000 [IU] | Freq: Once | INTRAVENOUS | Status: AC
  Administered 2022-12-05: 3000 [IU] via INTRAVENOUS
  Filled 2022-12-05: qty 3000

## 2022-12-05 MED ORDER — POTASSIUM CHLORIDE 10 MEQ/100ML IV SOLN: 10.0000 meq | INTRAVENOUS | Status: DC

## 2022-12-05 MED ORDER — LACTATED RINGERS IV BOLUS
500.0000 mL | Freq: Once | INTRAVENOUS | Status: AC
  Administered 2022-12-05: 500 mL via INTRAVENOUS

## 2022-12-05 MED ORDER — ACETAMINOPHEN 325 MG PO TABS: 650.0000 mg | ORAL_TABLET | Freq: Four times a day (QID) | ORAL | Status: DC | PRN

## 2022-12-05 MED ORDER — PANTOPRAZOLE SODIUM 40 MG IV SOLR
40.0000 mg | INTRAVENOUS | Status: DC
  Administered 2022-12-06 – 2022-12-11 (×6): 40 mg via INTRAVENOUS
  Filled 2022-12-05 (×6): qty 10

## 2022-12-05 MED ORDER — DEXTROSE 50 % IV SOLN: 50.0000 mL | INTRAVENOUS | Status: DC | PRN

## 2022-12-05 MED ORDER — ACETAMINOPHEN 650 MG RE SUPP: 650.0000 mg | Freq: Four times a day (QID) | RECTAL | Status: DC | PRN

## 2022-12-05 MED ORDER — ONDANSETRON HCL 4 MG/2ML IJ SOLN
4.0000 mg | Freq: Once | INTRAMUSCULAR | Status: AC
  Administered 2022-12-05: 4 mg via INTRAVENOUS
  Filled 2022-12-05: qty 2

## 2022-12-05 MED ORDER — DEXTROSE 50 % IV SOLN
25.0000 mL | INTRAVENOUS | Status: DC | PRN
  Administered 2022-12-05: 25 mL via INTRAVENOUS
  Filled 2022-12-05: qty 50

## 2022-12-05 MED ORDER — ENSURE ENLIVE PO LIQD: 237.0000 mL | Freq: Two times a day (BID) | ORAL | Status: DC

## 2022-12-05 MED ORDER — IOHEXOL 350 MG/ML SOLN
100.0000 mL | Freq: Once | INTRAVENOUS | Status: AC | PRN
  Administered 2022-12-05: 100 mL via INTRAVENOUS

## 2022-12-05 NOTE — Progress Notes (Signed)
2148 BG: 65 Asymptomatic-Refused Juice and started D5 0.9% NS 17mL/hr 2246 BG: 69 Asymptomatic-Continues to refuse juice and D50 25mL given. 2340 BG: 131

## 2022-12-05 NOTE — ED Triage Notes (Signed)
EMS reports from home, signifigant decline over last 4-5 days, has not eaten much and very little oral intake. Pt c/o vomiting (bile). Hx Mulitple myeloma. CBG on scene 66.  BP 146/60 HR 110 RR 18 Sp02 98 RA CBG 66  Pt provided 8oz Orange Juice on arrival

## 2022-12-05 NOTE — ED Provider Notes (Signed)
72 yo female here with hx of multiple myeloma, generalized abdominal discomfort Pending CT imaging and medical admission    Physical Exam  BP 96/79   Pulse (!) 106   Temp 97.7 F (36.5 C)   Resp 17   LMP 08/10/2003   SpO2 99%   Physical Exam  Procedures  .Critical Care  Performed by: Terald Sleeper, MD Authorized by: Terald Sleeper, MD   Critical care provider statement:    Critical care time (minutes):  40   Critical care time was exclusive of:  Separately billable procedures and treating other patients   Critical care was time spent personally by me on the following activities:  Ordering and performing treatments and interventions, ordering and review of laboratory studies, ordering and review of radiographic studies, pulse oximetry, review of old charts, examination of patient and evaluation of patient's response to treatment Comments:     IV heparin for PE, IV K repletion   ED Course / MDM    Medical Decision Making Amount and/or Complexity of Data Reviewed Labs: ordered. Radiology: ordered.  Risk Prescription drug management.    Pulm embolism scans concerning for multiple segmental subsegmental PE without evidence of heart strain.  Patient be started on heparin.  IV potassium was ordered for repletion.  The patient and her husband updated regarding these findings and agreement.  No evidence of infection otherwise, or indication for antibiotics.  Blood pressure has been stable.  Oxygen levels are stable at the time of admission.   Terald Sleeper, MD 12/05/22 [place] [place]

## 2022-12-05 NOTE — ED Notes (Signed)
Pt given 2 cups of orange juice 

## 2022-12-05 NOTE — H&P (Signed)
History and Physical    Patient: Jessica [name]:[id number] DOB: 1951-06-10 DOA: 12/05/2022 DOS: the patient was seen and examined on 12/05/2022 PCP: Emilio Aspen, MD  Patient coming from: Home  Chief Complaint:  Chief Complaint  Patient presents with   Failure To Thrive   Weakness   HPI: Jessica [name] is a 72 y.o. female with medical history significant of multiple myeloma, anemia of chronic disease, history of myopathy, neuropathy who presents to the emergency department from home via EMS due to generalized weakness, poor appetite and poor oral intake which has resulted in being restricted to bed and wheelchair since last 4 days.  Most of the history was obtained from husband at bedside and ED physician, per report, patient has been having progressive weakness with difficulty in ambulation which was requiring assist for several months.  She sustained a fall about a week ago with an abrasion of left lower extremity, this occurred while transferring from bed to wheelchair.  She was reported to have postprandial nausea and vomiting.  Patient has not had any meal in the last 2 days per husband.  ED Course:  In the emergency department, patient was hemodynamically stable on arrival to the ED with an O2 sat of 90% on room air and other vital signs being within normal range.  Workup in the ED showed normocytic anemia, BMP showed sodium 131, potassium 2.6, chloride 96, bicarb 19, blood glucose 77, BUN < 5, albumin 2.8, total bilirubin 2.2, anion gap 16, ammonia 36, magnesium 1.8, lactic acid was normal TSH was normal, lipase 26. CT angiography of chest and CT abdomen and pelvis with contrast showed bilateral lower lobe and right middle lobe segmental and subsegmental pulmonary emboli.  No associated heart strain or pulmonary infarction.  Left ventricular hypertrophy.  Tiny hiatal hernia.  Hepatic steatosis.  Aortic atherosclerosis.  No osseous lesions to suggest multiple myeloma recurrence or  metastasis. Patient was started on a heparin drip, IV hydration was provided, Reglan was given, potassium was replenished.  Hospitalist was asked to admit patient for further evaluation and management.  Review of Systems: Review of systems as noted in the HPI. All other systems reviewed and are negative.   Past Medical History:  Diagnosis Date   H/O stem cell transplant [facility]) 03/2015   [facility]   Multiple myeloma [facility]) 11/19/2014   Multiple myeloma (HCC)    Multiple myeloma (HCC)    MVP (mitral valve prolapse)    req prophylaxis   Other constipation 12/03/2014   Right ovarian cyst 10/16/15   16 mm simple cyst - ultrasound yearly.   Upper respiratory infection, acute 08/12/2015   Past Surgical History:  Procedure Laterality Date   CATARACT EXTRACTION, BILATERAL  10/2010   Implants(ReSTOR) Bilat   IR IMAGING GUIDED PORT INSERTION  03/04/2022   LASER ABLATION OF CONDYLOMAS  1990   CIN 1 cervix   multiple myeloma surgery Right 08/2014   [facility]    Social History:  reports that she has never smoked. She has never used smokeless tobacco. She reports that she does not drink alcohol and does not use drugs.   Allergies  Allergen Reactions   Augmentin [Amoxicillin-Pot Clavulanate] Hives, Itching, Nausea And Vomiting and Rash    ++Tolerates Cefepime++ Has patient had a PCN reaction causing immediate rash, facial/tongue/throat swelling, SOB or lightheadedness with hypotension: Yes Has patient had a PCN reaction causing severe rash involving mucus membranes or skin necrosis: Yes Has patient had a PCN reaction  that required hospitalization No Has patient had a PCN reaction occurring within the last 10 years: Yes If all of the above answers are "NO", then may proceed with Cephalosporin use. *okay to take other types of cillin   Albuterol Other (See Comments)    Elevated HR and BP   Codeine Nausea Only   Ibuprofen Nausea Only   Nsaids Other (See Comments)    Gi Upset    Penicillin G     Other Reaction(s): severe rash involving mucus membrane   Tolmetin Other (See Comments)    Gi Upset   Tetanus-Diphth-Acell Pertussis Rash    Received injection in ER on 8/1, developed fine rash all over body. Cellulitis to injection site on left deltoid.    Family History  Problem Relation Age of Onset   Osteoporosis Mother    Diabetes Father      Prior to Admission medications   Medication Sig Start Date End Date Taking? Authorizing Provider  acetaminophen (TYLENOL) 500 MG tablet Take 500-1,000 mg by mouth every 6 (six) hours as needed for mild pain, moderate pain, fever or headache.   Yes [provider]  prochlorperazine (COMPAZINE) 10 MG tablet Take 1 tablet (10 mg total) by mouth every 6 (six) hours as needed (Nausea or vomiting). 03/01/22  Yes Gorsuch, Ni, MD  vitamin B-12 (CYANOCOBALAMIN) 500 MCG tablet Take 500 mcg by mouth daily.   Yes [provider]  acyclovir (ZOVIRAX) 400 MG tablet Take 1 tablet (400 mg total) by mouth daily. Patient not taking: Reported on 12/05/2022 03/01/22   Artis Delay, MD  dexamethasone (DECADRON) 4 MG tablet Take 0.5-1 tablets (2-4 mg total) by mouth See admin instructions. Take 1 tablet alternate with 1/2 tablet every other day Patient not taking: Reported on 12/05/2022 08/31/22   Artis Delay, MD  lidocaine-prilocaine (EMLA) cream Apply to affected area once Patient not taking: Reported on 12/05/2022 03/01/22   Artis Delay, MD  nystatin (MYCOSTATIN/NYSTOP) powder Apply 1 Application topically 2 (two) times daily. Patient not taking: Reported on 12/05/2022 05/18/22   Artis Delay, MD  omeprazole (PRILOSEC) 20 MG capsule TAKE 1 CAPSULE(20 MG) BY MOUTH DAILY Patient not taking: Reported on 12/05/2022 11/30/22   Artis Delay, MD  ondansetron (ZOFRAN) 8 MG tablet TAKE 1 TABLET(8 MG) BY MOUTH TWICE DAILY AS NEEDED FOR NAUSEA OR VOMITING Patient not taking: Reported on 12/05/2022 10/11/22   Artis Delay, MD    Physical Exam: BP (!)  112/49 (BP Location: Left Arm)   Pulse (!) 123   Temp 98.5 F (36.9 C) (Oral)   Resp 17   Ht 5' (1.524 m)   Wt 59.9 kg   LMP 08/10/2003   SpO2 99%   BMI 25.78 kg/m   General: 72 y.o. year-old female chronically ill appearing, but in no acute distress.  Alert and oriented x3. HEENT: NCAT, EOMI, dry mucous membrane Neck: Supple, trachea medial Cardiovascular: Regular rate and rhythm with no rubs or gallops.  No thyromegaly or JVD noted.  No lower extremity edema. 2/4 pulses in all 4 extremities. Respiratory: Clear to auscultation with no wheezes or rales. Good inspiratory effort. Abdomen: Soft, nontender nondistended with normal bowel sounds x4 quadrants. Muskuloskeletal: No cyanosis, clubbing or edema noted bilaterally Neuro: Bilateral weak strength in lower extremities (1/5 LLE, 2/5 RLE).  sensation, reflexes intact Skin: Abrasive lesion with surrounding ecchymosis noted on anterior shin of LLE.  Psychiatry: Judgement and insight appear normal. Mood is appropriate for condition and setting  Labs on Admission:  Basic Metabolic Panel: Recent Labs  Lab 12/05/22 1016 12/05/22 1041  NA 131* 135  K 2.6* 2.5*  CL 96* 96*  CO2 19*  --   GLUCOSE 77 89  BUN <5* <3*  CREATININE 0.75 0.60  CALCIUM 8.6*  --   MG 1.8  --    Liver Function Tests: Recent Labs  Lab 12/05/22 1016  AST 28  ALT 9  ALKPHOS 62  BILITOT 2.2*  PROT 8.2*  ALBUMIN 2.8*   Recent Labs  Lab 12/05/22 1016  LIPASE 26   Recent Labs  Lab 12/05/22 1017  AMMONIA 36*   CBC: Recent Labs  Lab 12/05/22 1016 12/05/22 1041  WBC 8.4  --   NEUTROABS 2.3  --   HGB 10.6* 11.9*  HCT 33.3* 35.0*  MCV 87.9  --   PLT 260  --    Cardiac Enzymes: No results for input(s): "CKTOTAL", "CKMB", "CKMBINDEX", "TROPONINI" in the last 168 hours.  BNP (last 3 results) No results for input(s): "BNP" in the last 8760 hours.  ProBNP (last 3 results) No results for input(s): "PROBNP" in the last 8760  hours.  CBG: Recent Labs  Lab 12/05/22 0955 12/05/22 1826 12/05/22 1938 12/05/22 2148  GLUCAP 66* 56* 68* 65*    Radiological Exams on Admission: CT ABDOMEN PELVIS W CONTRAST  Result Date: 12/05/2022 CLINICAL DATA:  Pulmonary embolism (PE) suspected, high prob; Abdominal pain, acute, nonlocalized Multiple myeloma, weakness EXAM: CT ANGIOGRAPHY CHEST CT ABDOMEN AND PELVIS WITH CONTRAST TECHNIQUE: Multidetector CT imaging of the chest was performed using the standard protocol during bolus administration of intravenous contrast. Multiplanar CT image reconstructions and MIPs were obtained to evaluate the vascular anatomy. Multidetector CT imaging of the abdomen and pelvis was performed using the standard protocol during bolus administration of intravenous contrast. RADIATION DOSE REDUCTION: This exam was performed according to the departmental dose-optimization program which includes automated exposure control, adjustment of the mA and/or kV according to patient size and/or use of iterative reconstruction technique. CONTRAST:  OMNIPAQUE IOHEXOL 350 MG/ML SOLN COMPARISON:  PetCT 04/20/2019 FINDINGS: CTA CHEST FINDINGS Cardiovascular: Right chest wall Port-A-Cath with tip at the superior cavoatrial junction. Satisfactory opacification of the pulmonary arteries to the segmental level. Right lower lobe and right middle lobe filling defect at the segmental and subsegmental level. Left lower lobe lung filling defect at the segmental and subsegmental levels. Left ventricular hypertrophy. No significant pericardial effusion. The thoracic aorta is normal in caliber. Mild atherosclerotic plaque of the thoracic aorta. No coronary artery calcifications. Mediastinum/Nodes: No enlarged mediastinal, hilar, or axillary lymph nodes. Thyroid gland, trachea, and esophagus demonstrate no significant findings. Tiny hiatal hernia. Lungs/Pleura: Bilateral lower lobe subsegmental atelectasis. No focal consolidation. No  pulmonary nodule. No pulmonary mass. No pleural effusion. No pneumothorax. Musculoskeletal: No chest wall abnormality. No suspicious lytic or blastic osseous lesions. No acute displaced fracture. Review of the MIP images confirms the above findings. CT ABDOMEN and PELVIS FINDINGS Hepatobiliary: The hepatic parenchyma is diffusely hypodense compared to the splenic parenchyma consistent with fatty infiltration. No focal liver abnormality. No gallstones, gallbladder wall thickening, or pericholecystic fluid. No biliary dilatation. Pancreas: No focal lesion. Normal pancreatic contour. No surrounding inflammatory changes. No main pancreatic ductal dilatation. Spleen: Normal in size without focal abnormality. Adrenals/Urinary Tract: No adrenal nodule bilaterally. Bilateral kidneys enhance symmetrically. Bilateral fluid density lesions likely represent simple renal cysts. Simple renal cysts, in the absence of clinically indicated signs/symptoms, require no independent follow-up. Subcentimeter hypodensity too small  to characterize-no further follow-up indicated. No hydronephrosis. No hydroureter. The urinary bladder is unremarkable. Stomach/Bowel: Stomach is within normal limits. No evidence of bowel wall thickening or dilatation. Appendix appears normal. Vascular/Lymphatic: No abdominal aorta or iliac aneurysm. No abdominal, pelvic, or inguinal lymphadenopathy. Reproductive: Uterus and bilateral adnexa are unremarkable. Other: No intraperitoneal free fluid. No intraperitoneal free gas. No organized fluid collection. Musculoskeletal: No abdominal wall hernia or abnormality. No suspicious lytic or blastic osseous lesions. No acute displaced fracture. Review of the MIP images confirms the above findings. IMPRESSION: 1. Bilateral lower lobe and right middle lobe segmental and subsegmental pulmonary emboli. No associated heart strain or pulmonary infarction. 2. Left ventricular hypertrophy. 3. Tiny hiatal hernia. 4. Hepatic  steatosis. 5.  Aortic Atherosclerosis (ICD10-I70.0). 6. No osseous lesions to suggest multiple myeloma recurrence or metastasis. These results were called by telephone at the time of interpretation on 12/05/2022 at 5:06 pm to provider Dr. [place] Nation, who verbally acknowledged these results. Electronically Signed   By: Tish Frederickson M.D.   On: 12/05/2022 17:11   CT Angio Chest PE W and/or Wo Contrast  Result Date: 12/05/2022 CLINICAL DATA:  Pulmonary embolism (PE) suspected, high prob; Abdominal pain, acute, nonlocalized Multiple myeloma, weakness EXAM: CT ANGIOGRAPHY CHEST CT ABDOMEN AND PELVIS WITH CONTRAST TECHNIQUE: Multidetector CT imaging of the chest was performed using the standard protocol during bolus administration of intravenous contrast. Multiplanar CT image reconstructions and MIPs were obtained to evaluate the vascular anatomy. Multidetector CT imaging of the abdomen and pelvis was performed using the standard protocol during bolus administration of intravenous contrast. RADIATION DOSE REDUCTION: This exam was performed according to the departmental dose-optimization program which includes automated exposure control, adjustment of the mA and/or kV according to patient size and/or use of iterative reconstruction technique. CONTRAST:  OMNIPAQUE IOHEXOL 350 MG/ML SOLN COMPARISON:  PetCT 04/20/2019 FINDINGS: CTA CHEST FINDINGS Cardiovascular: Right chest wall Port-A-Cath with tip at the superior cavoatrial junction. Satisfactory opacification of the pulmonary arteries to the segmental level. Right lower lobe and right middle lobe filling defect at the segmental and subsegmental level. Left lower lobe lung filling defect at the segmental and subsegmental levels. Left ventricular hypertrophy. No significant pericardial effusion. The thoracic aorta is normal in caliber. Mild atherosclerotic plaque of the thoracic aorta. No coronary artery calcifications. Mediastinum/Nodes: No enlarged mediastinal,  hilar, or axillary lymph nodes. Thyroid gland, trachea, and esophagus demonstrate no significant findings. Tiny hiatal hernia. Lungs/Pleura: Bilateral lower lobe subsegmental atelectasis. No focal consolidation. No pulmonary nodule. No pulmonary mass. No pleural effusion. No pneumothorax. Musculoskeletal: No chest wall abnormality. No suspicious lytic or blastic osseous lesions. No acute displaced fracture. Review of the MIP images confirms the above findings. CT ABDOMEN and PELVIS FINDINGS Hepatobiliary: The hepatic parenchyma is diffusely hypodense compared to the splenic parenchyma consistent with fatty infiltration. No focal liver abnormality. No gallstones, gallbladder wall thickening, or pericholecystic fluid. No biliary dilatation. Pancreas: No focal lesion. Normal pancreatic contour. No surrounding inflammatory changes. No main pancreatic ductal dilatation. Spleen: Normal in size without focal abnormality. Adrenals/Urinary Tract: No adrenal nodule bilaterally. Bilateral kidneys enhance symmetrically. Bilateral fluid density lesions likely represent simple renal cysts. Simple renal cysts, in the absence of clinically indicated signs/symptoms, require no independent follow-up. Subcentimeter hypodensity too small to characterize-no further follow-up indicated. No hydronephrosis. No hydroureter. The urinary bladder is unremarkable. Stomach/Bowel: Stomach is within normal limits. No evidence of bowel wall thickening or dilatation. Appendix appears normal. Vascular/Lymphatic: No abdominal aorta or iliac aneurysm. No abdominal, pelvic,  or inguinal lymphadenopathy. Reproductive: Uterus and bilateral adnexa are unremarkable. Other: No intraperitoneal free fluid. No intraperitoneal free gas. No organized fluid collection. Musculoskeletal: No abdominal wall hernia or abnormality. No suspicious lytic or blastic osseous lesions. No acute displaced fracture. Review of the MIP images confirms the above findings.  IMPRESSION: 1. Bilateral lower lobe and right middle lobe segmental and subsegmental pulmonary emboli. No associated heart strain or pulmonary infarction. 2. Left ventricular hypertrophy. 3. Tiny hiatal hernia. 4. Hepatic steatosis. 5.  Aortic Atherosclerosis (ICD10-I70.0). 6. No osseous lesions to suggest multiple myeloma recurrence or metastasis. These results were called by telephone at the time of interpretation on 12/05/2022 at 5:06 pm to provider Dr. [place] Nation, who verbally acknowledged these results. Electronically Signed   By: Tish Frederickson M.D.   On: 12/05/2022 17:11    EKG: I independently viewed the EKG done and my findings are as followed: EKG was not done in the ED  Assessment/Plan Present on Admission:  Pulmonary embolism (HCC)  Failure to thrive in adult  Hypokalemia  Multiple myeloma (HCC)  Steroid myopathy  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  Chronic GERD  Principal Problem:   Pulmonary embolism (HCC) Active Problems:   Multiple myeloma (HCC)   Hypokalemia   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   Chronic GERD   Generalized weakness   Steroid myopathy   Failure to thrive in adult   Hypoglycemia   Hyperammonemia (HCC)   Hypoalbuminemia due to protein-calorie malnutrition (HCC)   Abrasion of left leg  Acute pulmonary embolism CT angiogram of chest with contrast was suggestive of bilateral lower lobe and right middle lobe segmental and subsegmental pulmonary emboli Patient was started on heparin drip with plan to transition to oral DOAC in the morning  Generalized weakness Failure to thrive in adult Generalized weakness may be due to dehydration considering patient's poor oral intake Continue IV hydration Continue fall precaution Dietitian will be consulted and we shall await further recommendation  Hypoglycemia Blood glucose dropped to 56, patient continues to have poor oral intake D5 NS was started Continue CBG and hypoglycemic protocol  Hypokalemia K+ is 2.6 K+  will be replenished Please monitor for AM K+ for further replenishmemnt  Hyperammonemia Ammonia 36, patient with hepatic steatosis Lactulose will be given  Left leg abrasion wound Wound care nurse will be consulted  Hypoalbuminemia possibly secondary to moderate protein calorie malnutrition Albumin 2.6, protein supplement will be provided  Multiple myeloma Patient follows with oncology (Dr. Bertis Ruddy)  with last visit being in January of this year  Steroid myopathy Continue Decadron  Anemia of chronic illness Hemoglobin is stable  Goals of care: Palliative care will be consulted  DVT prophylaxis: Heparin drip  Advance Care Planning:   Code Status: Full Code   Consults: Palliative care, dietitian  Family Communication: Husband at bedside (all questions answered to satisfaction)  Severity of Illness: The appropriate patient status for this patient is OBSERVATION. Observation status is judged to be reasonable and necessary in order to provide the required intensity of service to ensure the patient's safety. The patient's presenting symptoms, physical exam findings, and initial radiographic and laboratory data in the context of their medical condition is felt to place them at decreased risk for further clinical deterioration. Furthermore, it is anticipated that the patient will be medically stable for discharge from the hospital within 2 midnights of admission.   Author: Frankey Shown, DO 12/05/2022 10:12 PM  For on call review [email or web address].

## 2022-12-05 NOTE — Progress Notes (Signed)
ANTICOAGULATION CONSULT NOTE - Initial Consult  Pharmacy Consult for heparin Indication: pulmonary embolus  Allergies  Allergen Reactions   Augmentin [Amoxicillin-Pot Clavulanate] Hives, Itching, Nausea And Vomiting and Rash    ++Tolerates Cefepime++ Has patient had a PCN reaction causing immediate rash, facial/tongue/throat swelling, SOB or lightheadedness with hypotension: Yes Has patient had a PCN reaction causing severe rash involving mucus membranes or skin necrosis: Yes Has patient had a PCN reaction that required hospitalization No Has patient had a PCN reaction occurring within the last 10 years: Yes If all of the above answers are "NO", then may proceed with Cephalosporin use. *okay to take other types of cillin   Albuterol Other (See Comments)    Elevated HR and BP   Codeine Nausea Only   Ibuprofen Nausea Only   Nsaids Other (See Comments)    Gi Upset   Penicillin G     Other Reaction(s): severe rash involving mucus membrane   Tolmetin Other (See Comments)    Gi Upset   Tetanus-Diphth-Acell Pertussis Rash    Received injection in ER on 8/1, developed fine rash all over body. Cellulitis to injection site on left deltoid.    Patient Measurements:   Heparin Dosing Weight: 60 kg  Vital Signs: Temp: 97.7 F (36.5 C) (04/28 1443) Temp Source: Oral (04/28 0954) BP: 97/58 (04/28 1700) Pulse Rate: 117 (04/28 1700)  Labs: Recent Labs    12/05/22 1016 12/05/22 1041  HGB 10.6* 11.9*  HCT 33.3* 35.0*  PLT 260  --   LABPROT 13.3  --   INR 1.0  --   CREATININE 0.75 0.60  TROPONINIHS 9  --     CrCl cannot be calculated (Unknown ideal weight.).   Medical History: Past Medical History:  Diagnosis Date   H/O stem cell transplant [facility]) 03/2015   [facility]   Multiple myeloma [facility]) 11/19/2014   Multiple myeloma (HCC)    Multiple myeloma (HCC)    MVP (mitral valve prolapse)    req prophylaxis   Other constipation 12/03/2014   Right ovarian cyst 10/16/15   16  mm simple cyst - ultrasound yearly.   Upper respiratory infection, acute 08/12/2015   Assessment: 72 yo F presents with failure to thrive / weakness. CT results show bilateral PEs. No R heart strain. Hgb 11.9, plts wnl. No s/s of bleed. No anticoag PTA.  Goal of Therapy:  Heparin level 0.3-0.7 units/ml Monitor platelets by anticoagulation protocol: Yes   Plan:  Give heparin 3,000 unit bolus Start heparin at 900 units/hr Check 8 hr heparin level at 0230 tomorrow Monitor CBC, s/s of bleed  Enzo Bi, PharmD, BCPS, BCIDP Clinical Pharmacist 12/05/2022 6:06 PM

## 2022-12-05 NOTE — ED Provider Notes (Signed)
[place] EMERGENCY DEPARTMENT AT [facility] Provider Note   CSN: [id number] Arrival date & time: 12/05/22  4696     History  Chief Complaint  Patient presents with   Failure To Thrive   Weakness    Jessica DEFRANCESCO is a 72 y.o. female.   Weakness Associated symptoms: abdominal pain, nausea and vomiting   Patient presents for failure to thrive.  Medical history includes neuropathy, malnutrition, HTN, CKD, GERD, anemia, multiple myeloma.  She was last seen in oncology office in January.  At that time, palliative care and hospice consideration were recommended.  Patient typically has poor appetite and poor p.o. intake.  This has worsened over the past 5 days.  She will typically have postprandial nausea and vomiting.  Nausea resolves after vomiting.  She will, at times, be able to keep down her medications.  She had a fall 1 week ago and sustained injuries to anterior distal lower extremities.  Fall occurred while transferring from a wheelchair.  She has been nonambulatory for months.  Patient has continued pain in left shin secondary to the fall and a skin tear that she sustained.  She denies any other areas of new pain.  She does have chronic diffuse pain.  She denies current nausea.  She arrives via EMS from home.  CBG with EMS was 66.  She was provided with orange juice.     Home Medications Prior to Admission medications   Medication Sig Start Date End Date Taking? Authorizing Provider  acetaminophen (TYLENOL) 500 MG tablet Take 500-1,000 mg by mouth every 6 (six) hours as needed for mild pain, moderate pain, fever or headache.   Yes [provider]  prochlorperazine (COMPAZINE) 10 MG tablet Take 1 tablet (10 mg total) by mouth every 6 (six) hours as needed (Nausea or vomiting). 03/01/22  Yes Gorsuch, Ni, MD  vitamin B-12 (CYANOCOBALAMIN) 500 MCG tablet Take 500 mcg by mouth daily.   Yes [provider]  acyclovir (ZOVIRAX) 400 MG tablet Take 1 tablet  (400 mg total) by mouth daily. Patient not taking: Reported on 12/05/2022 03/01/22   Artis Delay, MD  dexamethasone (DECADRON) 4 MG tablet Take 0.5-1 tablets (2-4 mg total) by mouth See admin instructions. Take 1 tablet alternate with 1/2 tablet every other day Patient not taking: Reported on 12/05/2022 08/31/22   Artis Delay, MD  lidocaine-prilocaine (EMLA) cream Apply to affected area once Patient not taking: Reported on 12/05/2022 03/01/22   Artis Delay, MD  nystatin (MYCOSTATIN/NYSTOP) powder Apply 1 Application topically 2 (two) times daily. Patient not taking: Reported on 12/05/2022 05/18/22   Artis Delay, MD  omeprazole (PRILOSEC) 20 MG capsule TAKE 1 CAPSULE(20 MG) BY MOUTH DAILY Patient not taking: Reported on 12/05/2022 11/30/22   Artis Delay, MD  ondansetron (ZOFRAN) 8 MG tablet TAKE 1 TABLET(8 MG) BY MOUTH TWICE DAILY AS NEEDED FOR NAUSEA OR VOMITING Patient not taking: Reported on 12/05/2022 10/11/22   Artis Delay, MD      Allergies    Augmentin [amoxicillin-pot clavulanate], Albuterol, Codeine, Ibuprofen, Nsaids, Penicillin g, Tolmetin, and Tetanus-diphth-acell pertussis    Review of Systems   Review of Systems  Constitutional:  Positive for activity change, appetite change and fatigue.  Gastrointestinal:  Positive for abdominal pain, nausea and vomiting.  Skin:  Positive for wound.  Neurological:  Positive for weakness.    Physical Exam Updated Vital Signs BP (!) 99/45   Pulse (!) 110   Temp 97.7 F (36.5 C)   Resp  17   LMP 08/10/2003   SpO2 99%  Physical Exam Vitals and nursing note reviewed.  Constitutional:      General: She is not in acute distress.    Appearance: She is well-developed. She is ill-appearing (Chronically). She is not toxic-appearing or diaphoretic.  HENT:     Head: Normocephalic and atraumatic.     Right Ear: External ear normal.     Left Ear: External ear normal.     Nose: Nose normal.     Mouth/Throat:     Mouth: Mucous membranes are moist.   Eyes:     Extraocular Movements: Extraocular movements intact.     Conjunctiva/sclera: Conjunctivae normal.  Cardiovascular:     Rate and Rhythm: Normal rate and regular rhythm.     Heart sounds: No murmur heard. Pulmonary:     Effort: Pulmonary effort is normal. No respiratory distress.     Breath sounds: Normal breath sounds. No wheezing or rales.  Chest:     Chest wall: No tenderness.  Abdominal:     Palpations: Abdomen is soft.     Tenderness: There is abdominal tenderness. There is no guarding or rebound.  Musculoskeletal:        General: No swelling.     Cervical back: Neck supple.  Skin:    General: Skin is warm and dry.     Comments: Poorly healing skin tear to anterior left shin  Neurological:     General: No focal deficit present.     Mental Status: She is alert and oriented to person, place, and time.  Psychiatric:        Mood and Affect: Mood normal.        Behavior: Behavior normal.     ED Results / Procedures / Treatments   Labs (all labs ordered are listed, but only abnormal results are displayed) Labs Reviewed  CBC WITH DIFFERENTIAL/PLATELET - Abnormal; Notable for the following components:      Result Value   RBC 3.79 (*)    Hemoglobin 10.6 (*)    HCT 33.3 (*)    RDW 22.3 (*)    Lymphs Abs 5.0 (*)    Abs Immature Granulocytes 0.29 (*)    All other components within normal limits  COMPREHENSIVE METABOLIC PANEL - Abnormal; Notable for the following components:   Sodium 131 (*)    Potassium 2.6 (*)    Chloride 96 (*)    CO2 19 (*)    BUN <5 (*)    Calcium 8.6 (*)    Total Protein 8.2 (*)    Albumin 2.8 (*)    Total Bilirubin 2.2 (*)    Anion gap 16 (*)    All other components within normal limits  AMMONIA - Abnormal; Notable for the following components:   Ammonia 36 (*)    All other components within normal limits  CBG MONITORING, ED - Abnormal; Notable for the following components:   Glucose-Capillary 66 (*)    All other components within  normal limits  I-STAT CHEM 8, ED - Abnormal; Notable for the following components:   Potassium 2.5 (*)    Chloride 96 (*)    BUN <3 (*)    Calcium, Ion 1.07 (*)    Hemoglobin 11.9 (*)    HCT 35.0 (*)    All other components within normal limits  PROTIME-INR  LACTIC ACID, PLASMA  LIPASE, BLOOD  MAGNESIUM  TSH  LACTIC ACID, PLASMA  CBG MONITORING, ED  TROPONIN I (HIGH SENSITIVITY)  TROPONIN I (HIGH SENSITIVITY)    EKG None  Radiology No results found.  Procedures Procedures    Medications Ordered in ED Medications  potassium chloride 10 mEq in 100 mL IVPB (10 mEq Intravenous New Bag/Given 12/05/22 1544)  iohexol (OMNIPAQUE) 350 MG/ML injection 100 mL (has no administration in time range)  0.9 %  sodium chloride infusion (has no administration in time range)  fentaNYL (SUBLIMAZE) injection 25 mcg (has no administration in time range)  lactated ringers bolus 500 mL (0 mLs Intravenous Stopped 12/05/22 1100)  metoCLOPramide (REGLAN) injection 10 mg (10 mg Intravenous Given 12/05/22 1035)  ondansetron (ZOFRAN) injection 4 mg (4 mg Intravenous Given 12/05/22 1516)    ED Course/ Medical Decision Making/ A&P                             Medical Decision Making Amount and/or Complexity of Data Reviewed Labs: ordered. Radiology: ordered.  Risk Prescription drug management.   This patient presents to the ED for concern of generalized weakness and poor p.o. intake, this involves an extensive number of treatment options, and is a complaint that carries with it a high risk of complications and morbidity.  The differential diagnosis includes worsening cancer, dehydration, deconditioning, polypharmacy, SBO, PE, infection   Co morbidities that complicate the patient evaluation  neuropathy, malnutrition, HTN, CKD, GERD, anemia, multiple myeloma   Additional history obtained:  Additional history obtained from N/A External records from outside source obtained and reviewed  including EMR   Lab Tests:  I Ordered, and personally interpreted labs.  The pertinent results include: Hypokalemia is present.  There is a mild anion gap acidosis.  Glucose is low-normal.  Kidney function is normal.  Anemia is baseline.  No leukocytosis is present.  Troponin and lactate are normal.   Imaging Studies ordered:  I ordered imaging studies including CTA chest, CT of abdomen and pelvis I independently visualized and interpreted imaging which showed (pending at time of signout) I agree with the radiologist interpretation   Cardiac Monitoring: / EKG:  The patient was maintained on a cardiac monitor.  I personally viewed and interpreted the cardiac monitored which showed an underlying rhythm of: Sinus rhythm   Problem List / ED Course / Critical interventions / Medication management  Patient presents for acute on chronic nausea, vomiting, and p.o. intolerance.  She states that she has had poor p.o. intake for months.  This worsened over the past 5 days.  She will typically have nausea and vomiting after she tries to eat or drink.  She has been able to tolerate some sips of fluids and some medications at home.  On arrival, patient is alert and oriented.  She has edema to face, trunk, and proximal extremities.  Her breathing is unlabored.  She does have generalized abdominal tenderness without guarding.  She has a 40 week old skin tear to her left anterior shin which appears to be poorly healing but does not show evidence of infection.  Per chart review, she was hospitalized in January for failure to thrive.  Plan at time of discharge was follow-up with palliative care services.  Given her worsening symptoms over the past 5 days, broad diagnostic workup was initiated.  Patient was given gentle IV fluids in addition to Reglan for nausea.  Lab work shows hypokalemia.  Replacement potassium was ordered.  While in the ED, patient had recurrence of nausea.  Zofran was ordered.  At time of  signout, CT imaging studies were pending.  Care of patient was signed out to oncoming ED provider. I ordered medication including IV fluids for hydration; Reglan and Zofran for nausea; potassium chloride for hypokalemia Reevaluation of the patient after these medicines showed that the patient improved I have reviewed the patients home medicines and have made adjustments as needed   Social Determinants of Health:  Lives at home with significant other chronically nonambulatory         Final Clinical Impression(s) / ED Diagnoses Final diagnoses:  Generalized weakness  Nausea and vomiting, unspecified vomiting type    Rx / DC Orders ED Discharge Orders     None         Gloris Manchester, MD 12/05/22 1553

## 2022-12-05 NOTE — ED Notes (Signed)
ED TO INPATIENT HANDOFF REPORT  ED Nurse Name and Phone #: Mellody Dance  [phone]  S Name/Age/Gender Jessica [name] 72 y.o. female Room/Bed: WA25/WA25  Code Status   Code Status: Full Code  Home/SNF/Other  Patient oriented to: self, place, time, and situation Is this baseline? Yes   Triage Complete: Triage complete  Chief Complaint Pulmonary embolism [facility]) [I26.99]  Triage Note EMS reports from home, signifigant decline over last 4-5 days, has not eaten much and very little oral intake. Pt c/o vomiting (bile). Hx Mulitple myeloma. CBG on scene 66.  BP 146/60 HR 110 RR 18 Sp02 98 RA CBG 66  Pt provided 8oz Orange Juice on arrival    Allergies Allergies  Allergen Reactions   Augmentin [Amoxicillin-Pot Clavulanate] Hives, Itching, Nausea And Vomiting and Rash    ++Tolerates Cefepime++ Has patient had a PCN reaction causing immediate rash, facial/tongue/throat swelling, SOB or lightheadedness with hypotension: Yes Has patient had a PCN reaction causing severe rash involving mucus membranes or skin necrosis: Yes Has patient had a PCN reaction that required hospitalization No Has patient had a PCN reaction occurring within the last 10 years: Yes If all of the above answers are "NO", then may proceed with Cephalosporin use. *okay to take other types of cillin   Albuterol Other (See Comments)    Elevated HR and BP   Codeine Nausea Only   Ibuprofen Nausea Only   Nsaids Other (See Comments)    Gi Upset   Penicillin G     Other Reaction(s): severe rash involving mucus membrane   Tolmetin Other (See Comments)    Gi Upset   Tetanus-Diphth-Acell Pertussis Rash    Received injection in ER on 8/1, developed fine rash all over body. Cellulitis to injection site on left deltoid.    Level of Care/Admitting Diagnosis ED Disposition     ED Disposition  Admit   Condition  --   Comment  Hospital Area: [facility] [[id number]]  Level of Care: Progressive  [102]  Admit to Progressive based on following criteria: CARDIOVASCULAR & THORACIC of moderate stability with acute coronary syndrome symptoms/low risk myocardial infarction/hypertensive urgency/arrhythmias/heart failure potentially compromising stability and stable post cardiovascular intervention patients.  May place patient in observation at [facility] or [facility] if equivalent level of care is available:: Yes  Covid Evaluation: Asymptomatic - no recent exposure (last 10 days) testing not required  Diagnosis: Pulmonary embolism [facility]) [[id number]]  Admitting Physician: Frankey Shown [[id number]]  Attending Physician: Frankey Shown [[id number]]          B Medical/Surgery History Past Medical History:  Diagnosis Date   H/O stem cell transplant (HCC) 03/2015   [facility]   Multiple myeloma (HCC) 11/19/2014   Multiple myeloma (HCC)    Multiple myeloma (HCC)    MVP (mitral valve prolapse)    req prophylaxis   Other constipation 12/03/2014   Right ovarian cyst 10/16/15   16 mm simple cyst - ultrasound yearly.   Upper respiratory infection, acute 08/12/2015   Past Surgical History:  Procedure Laterality Date   CATARACT EXTRACTION, BILATERAL  10/2010   Implants(ReSTOR) Bilat   IR IMAGING GUIDED PORT INSERTION  03/04/2022   LASER ABLATION OF CONDYLOMAS  1990   CIN 1 cervix   multiple myeloma surgery Right 08/2014   [facility]     A IV Location/Drains/Wounds Patient Lines/Drains/Airways Status     Active Line/Drains/Airways     Name Placement date Placement time Site Days  Implanted Port 03/04/22 Right Chest 03/04/22  0958  Chest  276   Peripheral IV 12/05/22 20 G Right Antecubital 12/05/22  1015  Antecubital  less than 1   External Urinary Catheter 08/12/22  1609  --  115            Intake/Output Last 24 hours  Intake/Output Summary (Last 24 hours) at 12/05/2022 1829 Last data filed at 12/05/2022 1100 Gross per 24 hour  Intake 500 ml  Output --  Net 500 ml     Labs/Imaging Results for orders placed or performed during the hospital encounter of 12/05/22 (from the past 48 hour(s))  CBG monitoring, ED     Status: Abnormal   Collection Time: 12/05/22  9:55 AM  Result Value Ref Range   Glucose-Capillary 66 (L) 70 - 99 mg/dL    Comment: Glucose reference range applies only to samples taken after fasting for at least 8 hours.  CBC with Differential     Status: Abnormal   Collection Time: 12/05/22 10:16 AM  Result Value Ref Range   WBC 8.4 4.0 - 10.5 K/uL   RBC 3.79 (L) 3.87 - 5.11 MIL/uL   Hemoglobin 10.6 (L) 12.0 - 15.0 g/dL   HCT 16.1 (L) 09.6 - 04.5 %   MCV 87.9 80.0 - 100.0 fL   MCH 28.0 26.0 - 34.0 pg   MCHC 31.8 30.0 - 36.0 g/dL   RDW 40.9 (H) 81.1 - 91.4 %   Platelets 260 150 - 400 K/uL   nRBC 0.0 0.0 - 0.2 %   Neutrophils Relative % 28 %   Neutro Abs 2.3 1.7 - 7.7 K/uL   Lymphocytes Relative 60 %   Lymphs Abs 5.0 (H) 0.7 - 4.0 K/uL   Monocytes Relative 8 %   Monocytes Absolute 0.7 0.1 - 1.0 K/uL   Eosinophils Relative 0 %   Eosinophils Absolute 0.0 0.0 - 0.5 K/uL   Basophils Relative 1 %   Basophils Absolute 0.1 0.0 - 0.1 K/uL   Immature Granulocytes 3 %   Abs Immature Granulocytes 0.29 (H) 0.00 - 0.07 K/uL   Target Cells PRESENT     Comment: Performed at [facility], [address] [place], [place] [address]  Comprehensive metabolic panel     Status: Abnormal   Collection Time: 12/05/22 10:16 AM  Result Value Ref Range   Sodium 131 (L) 135 - 145 mmol/L   Potassium 2.6 (LL) 3.5 - 5.1 mmol/L    Comment: CRITICAL RESULT CALLED TO, READ BACK BY AND VERIFIED WITH SAVOIE,B. RN AT 1114 12/05/22 MULLINS,T    Chloride 96 (L) 98 - 111 mmol/L   CO2 19 (L) 22 - 32 mmol/L   Glucose, Bld 77 70 - 99 mg/dL    Comment: Glucose reference range applies only to samples taken after fasting for at least 8 hours.   BUN <5 (L) 8 - 23 mg/dL   Creatinine, Ser 6.21 0.44 - 1.00 mg/dL   Calcium 8.6 (L) 8.9 - 10.3 mg/dL    Total Protein 8.2 (H) 6.5 - 8.1 g/dL   Albumin 2.8 (L) 3.5 - 5.0 g/dL   AST 28 15 - 41 U/L   ALT 9 0 - 44 U/L   Alkaline Phosphatase 62 38 - 126 U/L   Total Bilirubin 2.2 (H) 0.3 - 1.2 mg/dL   GFR, Estimated >30 >86 mL/min    Comment: (NOTE) Calculated using the CKD-EPI Creatinine Equation (2021)    Anion gap 16 (H) 5 -  15    Comment: Performed at [facility], [address], [place], [place] [address]  Protime-INR     Status: None   Collection Time: 12/05/22 10:16 AM  Result Value Ref Range   Prothrombin Time 13.3 11.4 - 15.2 seconds   INR 1.0 0.8 - 1.2    Comment: (NOTE) INR goal varies based on device and disease states. Performed at [facility], [address], [place], [place] [address]   Troponin I (High Sensitivity)     Status: None   Collection Time: 12/05/22 10:16 AM  Result Value Ref Range   Troponin I (High Sensitivity) 9 <18 ng/L    Comment: (NOTE) Elevated high sensitivity troponin I (hsTnI) values and significant  changes across serial measurements may suggest ACS but many other  chronic and acute conditions are known to elevate hsTnI results.  Refer to the "Links" section for chest pain algorithms and additional  guidance. Performed at [facility], [address] [place], [place] [address]   Lipase, blood     Status: None   Collection Time: 12/05/22 10:16 AM  Result Value Ref Range   Lipase 26 11 - 51 U/L    Comment: Performed at [facility], [address] [place], [place] [address]  Magnesium     Status: None   Collection Time: 12/05/22 10:16 AM  Result Value Ref Range   Magnesium 1.8 1.7 - 2.4 mg/dL    Comment: Performed at [facility], [address] [place], [place] [address]  Ammonia     Status: Abnormal   Collection Time: 12/05/22 10:17 AM  Result Value Ref Range   Ammonia 36 (H) 9 - 35 umol/L    Comment: Performed at [facility], [address], [place], [place] [address]  Lactic acid, plasma     Status: None   Collection Time: 12/05/22 10:17 AM  Result Value Ref Range   Lactic Acid, Venous 1.5 0.5 - 1.9 mmol/L    Comment: Performed at [facility], [address] [place], [place] [address]  TSH     Status: None   Collection Time: 12/05/22 10:34 AM  Result Value Ref Range   TSH 0.645 0.350 - 4.500 uIU/mL    Comment: Performed by a 3rd Generation assay with a functional sensitivity of <=0.01 uIU/mL. Performed at [facility], [address], [place], [place] [address]   I-stat chem 8, ED (not at [facility], [facility] or [facility])     Status: Abnormal   Collection Time: 12/05/22 10:41 AM  Result Value Ref Range   Sodium 135 135 - 145 mmol/L   Potassium 2.5 (LL) 3.5 - 5.1 mmol/L   Chloride 96 (L) 98 - 111 mmol/L   BUN <3 (L) 8 - 23 mg/dL   Creatinine, Ser 3.87 0.44 - 1.00 mg/dL   Glucose, Bld 89 70 - 99 mg/dL    Comment: Glucose reference range applies only to samples taken after fasting for at least 8 hours.   Calcium, Ion 1.07 (L) 1.15 - 1.40 mmol/L   TCO2 22 22 - 32 mmol/L   Hemoglobin 11.9 (L) 12.0 - 15.0 g/dL   HCT 56.4 (L) 33.2 - 95.1 %   Comment NOTIFIED PHYSICIAN   POC CBG, ED     Status: Abnormal   Collection Time: 12/05/22  6:26 PM  Result Value Ref Range   Glucose-Capillary 56 (L) 70 - 99 mg/dL  Comment: Glucose reference range applies only to samples taken after fasting for at least 8 hours.   CT ABDOMEN PELVIS W CONTRAST  Result Date: 12/05/2022 CLINICAL DATA:  Pulmonary embolism (PE) suspected, high prob; Abdominal pain, acute, nonlocalized Multiple myeloma, weakness EXAM: CT ANGIOGRAPHY CHEST CT ABDOMEN AND PELVIS WITH CONTRAST TECHNIQUE: Multidetector CT imaging of the chest was performed using the standard protocol during bolus administration of intravenous contrast. Multiplanar CT image reconstructions and MIPs were obtained to evaluate the vascular  anatomy. Multidetector CT imaging of the abdomen and pelvis was performed using the standard protocol during bolus administration of intravenous contrast. RADIATION DOSE REDUCTION: This exam was performed according to the departmental dose-optimization program which includes automated exposure control, adjustment of the mA and/or kV according to patient size and/or use of iterative reconstruction technique. CONTRAST:  OMNIPAQUE IOHEXOL 350 MG/ML SOLN COMPARISON:  PetCT 04/20/2019 FINDINGS: CTA CHEST FINDINGS Cardiovascular: Right chest wall Port-A-Cath with tip at the superior cavoatrial junction. Satisfactory opacification of the pulmonary arteries to the segmental level. Right lower lobe and right middle lobe filling defect at the segmental and subsegmental level. Left lower lobe lung filling defect at the segmental and subsegmental levels. Left ventricular hypertrophy. No significant pericardial effusion. The thoracic aorta is normal in caliber. Mild atherosclerotic plaque of the thoracic aorta. No coronary artery calcifications. Mediastinum/Nodes: No enlarged mediastinal, hilar, or axillary lymph nodes. Thyroid gland, trachea, and esophagus demonstrate no significant findings. Tiny hiatal hernia. Lungs/Pleura: Bilateral lower lobe subsegmental atelectasis. No focal consolidation. No pulmonary nodule. No pulmonary mass. No pleural effusion. No pneumothorax. Musculoskeletal: No chest wall abnormality. No suspicious lytic or blastic osseous lesions. No acute displaced fracture. Review of the MIP images confirms the above findings. CT ABDOMEN and PELVIS FINDINGS Hepatobiliary: The hepatic parenchyma is diffusely hypodense compared to the splenic parenchyma consistent with fatty infiltration. No focal liver abnormality. No gallstones, gallbladder wall thickening, or pericholecystic fluid. No biliary dilatation. Pancreas: No focal lesion. Normal pancreatic contour. No surrounding inflammatory changes. No main  pancreatic ductal dilatation. Spleen: Normal in size without focal abnormality. Adrenals/Urinary Tract: No adrenal nodule bilaterally. Bilateral kidneys enhance symmetrically. Bilateral fluid density lesions likely represent simple renal cysts. Simple renal cysts, in the absence of clinically indicated signs/symptoms, require no independent follow-up. Subcentimeter hypodensity too small to characterize-no further follow-up indicated. No hydronephrosis. No hydroureter. The urinary bladder is unremarkable. Stomach/Bowel: Stomach is within normal limits. No evidence of bowel wall thickening or dilatation. Appendix appears normal. Vascular/Lymphatic: No abdominal aorta or iliac aneurysm. No abdominal, pelvic, or inguinal lymphadenopathy. Reproductive: Uterus and bilateral adnexa are unremarkable. Other: No intraperitoneal free fluid. No intraperitoneal free gas. No organized fluid collection. Musculoskeletal: No abdominal wall hernia or abnormality. No suspicious lytic or blastic osseous lesions. No acute displaced fracture. Review of the MIP images confirms the above findings. IMPRESSION: 1. Bilateral lower lobe and right middle lobe segmental and subsegmental pulmonary emboli. No associated heart strain or pulmonary infarction. 2. Left ventricular hypertrophy. 3. Tiny hiatal hernia. 4. Hepatic steatosis. 5.  Aortic Atherosclerosis (ICD10-I70.0). 6. No osseous lesions to suggest multiple myeloma recurrence or metastasis. These results were called by telephone at the time of interpretation on 12/05/2022 at 5:06 pm to provider Dr. [place] Nation, who verbally acknowledged these results. Electronically Signed   By: Tish Frederickson M.D.   On: 12/05/2022 17:11   CT Angio Chest PE W and/or Wo Contrast  Result Date: 12/05/2022 CLINICAL DATA:  Pulmonary embolism (PE) suspected, high prob; Abdominal pain, acute, nonlocalized Multiple  myeloma, weakness EXAM: CT ANGIOGRAPHY CHEST CT ABDOMEN AND PELVIS WITH CONTRAST TECHNIQUE:  Multidetector CT imaging of the chest was performed using the standard protocol during bolus administration of intravenous contrast. Multiplanar CT image reconstructions and MIPs were obtained to evaluate the vascular anatomy. Multidetector CT imaging of the abdomen and pelvis was performed using the standard protocol during bolus administration of intravenous contrast. RADIATION DOSE REDUCTION: This exam was performed according to the departmental dose-optimization program which includes automated exposure control, adjustment of the mA and/or kV according to patient size and/or use of iterative reconstruction technique. CONTRAST:  OMNIPAQUE IOHEXOL 350 MG/ML SOLN COMPARISON:  PetCT 04/20/2019 FINDINGS: CTA CHEST FINDINGS Cardiovascular: Right chest wall Port-A-Cath with tip at the superior cavoatrial junction. Satisfactory opacification of the pulmonary arteries to the segmental level. Right lower lobe and right middle lobe filling defect at the segmental and subsegmental level. Left lower lobe lung filling defect at the segmental and subsegmental levels. Left ventricular hypertrophy. No significant pericardial effusion. The thoracic aorta is normal in caliber. Mild atherosclerotic plaque of the thoracic aorta. No coronary artery calcifications. Mediastinum/Nodes: No enlarged mediastinal, hilar, or axillary lymph nodes. Thyroid gland, trachea, and esophagus demonstrate no significant findings. Tiny hiatal hernia. Lungs/Pleura: Bilateral lower lobe subsegmental atelectasis. No focal consolidation. No pulmonary nodule. No pulmonary mass. No pleural effusion. No pneumothorax. Musculoskeletal: No chest wall abnormality. No suspicious lytic or blastic osseous lesions. No acute displaced fracture. Review of the MIP images confirms the above findings. CT ABDOMEN and PELVIS FINDINGS Hepatobiliary: The hepatic parenchyma is diffusely hypodense compared to the splenic parenchyma consistent with fatty infiltration. No  focal liver abnormality. No gallstones, gallbladder wall thickening, or pericholecystic fluid. No biliary dilatation. Pancreas: No focal lesion. Normal pancreatic contour. No surrounding inflammatory changes. No main pancreatic ductal dilatation. Spleen: Normal in size without focal abnormality. Adrenals/Urinary Tract: No adrenal nodule bilaterally. Bilateral kidneys enhance symmetrically. Bilateral fluid density lesions likely represent simple renal cysts. Simple renal cysts, in the absence of clinically indicated signs/symptoms, require no independent follow-up. Subcentimeter hypodensity too small to characterize-no further follow-up indicated. No hydronephrosis. No hydroureter. The urinary bladder is unremarkable. Stomach/Bowel: Stomach is within normal limits. No evidence of bowel wall thickening or dilatation. Appendix appears normal. Vascular/Lymphatic: No abdominal aorta or iliac aneurysm. No abdominal, pelvic, or inguinal lymphadenopathy. Reproductive: Uterus and bilateral adnexa are unremarkable. Other: No intraperitoneal free fluid. No intraperitoneal free gas. No organized fluid collection. Musculoskeletal: No abdominal wall hernia or abnormality. No suspicious lytic or blastic osseous lesions. No acute displaced fracture. Review of the MIP images confirms the above findings. IMPRESSION: 1. Bilateral lower lobe and right middle lobe segmental and subsegmental pulmonary emboli. No associated heart strain or pulmonary infarction. 2. Left ventricular hypertrophy. 3. Tiny hiatal hernia. 4. Hepatic steatosis. 5.  Aortic Atherosclerosis (ICD10-I70.0). 6. No osseous lesions to suggest multiple myeloma recurrence or metastasis. These results were called by telephone at the time of interpretation on 12/05/2022 at 5:06 pm to provider Dr. [place] Nation, who verbally acknowledged these results. Electronically Signed   By: Tish Frederickson M.D.   On: 12/05/2022 17:11    Pending Labs Unresulted Labs (From admission,  onward)     Start     Ordered   12/06/22 0500  Comprehensive metabolic panel  Tomorrow morning,   R        12/05/22 1817   12/06/22 0500  CBC  Tomorrow morning,   R        12/05/22 1817   12/06/22 0500  Magnesium  Tomorrow morning,   R        12/05/22 1817   12/06/22 0500  Phosphorus  Tomorrow morning,   R        12/05/22 1817   12/06/22 0230  Heparin level (unfractionated)  Once-Timed,   URGENT        12/05/22 1806   12/05/22 1017  Lactic acid, plasma  Now then every 2 hours,   R      12/05/22 1016            Vitals/Pain Today's Vitals   12/05/22 1443 12/05/22 1500 12/05/22 1700 12/05/22 1800  BP:  (!) 99/45 (!) 97/58   Pulse:  (!) 110 (!) 117   Resp:  17 18   Temp: 97.7 F (36.5 C)     TempSrc:      SpO2:  99% 100%   Weight:    59.9 kg  PainSc:        Isolation Precautions No active isolations  Medications Medications  potassium chloride 10 mEq in 100 mL IVPB (10 mEq Intravenous New Bag/Given 12/05/22 1746)  0.9 %  sodium chloride infusion ( Intravenous New Bag/Given 12/05/22 1545)  fentaNYL (SUBLIMAZE) injection 25 mcg (25 mcg Intravenous Given 12/05/22 1719)  heparin bolus via infusion 3,000 Units (has no administration in time range)  heparin ADULT infusion 100 units/mL (25000 units/268mL) (has no administration in time range)  acetaminophen (TYLENOL) tablet 650 mg (has no administration in time range)    Or  acetaminophen (TYLENOL) suppository 650 mg (has no administration in time range)  ondansetron (ZOFRAN) tablet 4 mg (has no administration in time range)    Or  ondansetron (ZOFRAN) injection 4 mg (has no administration in time range)  lactated ringers bolus 500 mL (0 mLs Intravenous Stopped 12/05/22 1100)  metoCLOPramide (REGLAN) injection 10 mg (10 mg Intravenous Given 12/05/22 1035)  ondansetron (ZOFRAN) injection 4 mg (4 mg Intravenous Given 12/05/22 1516)  iohexol (OMNIPAQUE) 350 MG/ML injection 100 mL (100 mLs Intravenous Contrast Given 12/05/22 1656)     Mobility manual wheelchair     Focused Assessments    R Recommendations: See Admitting Provider Note  Report given to:   Additional Notes: She is on her last bag of Potassium at this time. It shouldn't be much longer before its complete

## 2022-12-06 DIAGNOSIS — Z7189 Other specified counseling: Secondary | ICD-10-CM

## 2022-12-06 DIAGNOSIS — E162 Hypoglycemia, unspecified: Secondary | ICD-10-CM | POA: Diagnosis present

## 2022-12-06 DIAGNOSIS — K760 Fatty (change of) liver, not elsewhere classified: Secondary | ICD-10-CM | POA: Diagnosis present

## 2022-12-06 DIAGNOSIS — K219 Gastro-esophageal reflux disease without esophagitis: Secondary | ICD-10-CM | POA: Diagnosis present

## 2022-12-06 DIAGNOSIS — I2693 Single subsegmental pulmonary embolism without acute cor pulmonale: Secondary | ICD-10-CM

## 2022-12-06 DIAGNOSIS — I2699 Other pulmonary embolism without acute cor pulmonale: Secondary | ICD-10-CM | POA: Diagnosis present

## 2022-12-06 DIAGNOSIS — C9000 Multiple myeloma not having achieved remission: Secondary | ICD-10-CM

## 2022-12-06 DIAGNOSIS — G9341 Metabolic encephalopathy: Secondary | ICD-10-CM | POA: Diagnosis present

## 2022-12-06 DIAGNOSIS — Z7901 Long term (current) use of anticoagulants: Secondary | ICD-10-CM | POA: Diagnosis not present

## 2022-12-06 DIAGNOSIS — Z833 Family history of diabetes mellitus: Secondary | ICD-10-CM | POA: Diagnosis not present

## 2022-12-06 DIAGNOSIS — G729 Myopathy, unspecified: Secondary | ICD-10-CM

## 2022-12-06 DIAGNOSIS — R531 Weakness: Secondary | ICD-10-CM

## 2022-12-06 DIAGNOSIS — E43 Unspecified severe protein-calorie malnutrition: Secondary | ICD-10-CM | POA: Diagnosis present

## 2022-12-06 DIAGNOSIS — Z515 Encounter for palliative care: Secondary | ICD-10-CM | POA: Diagnosis not present

## 2022-12-06 DIAGNOSIS — R627 Adult failure to thrive: Secondary | ICD-10-CM | POA: Diagnosis present

## 2022-12-06 DIAGNOSIS — Y929 Unspecified place or not applicable: Secondary | ICD-10-CM | POA: Diagnosis not present

## 2022-12-06 DIAGNOSIS — E46 Unspecified protein-calorie malnutrition: Secondary | ICD-10-CM

## 2022-12-06 DIAGNOSIS — E8809 Other disorders of plasma-protein metabolism, not elsewhere classified: Secondary | ICD-10-CM | POA: Diagnosis present

## 2022-12-06 DIAGNOSIS — D638 Anemia in other chronic diseases classified elsewhere: Secondary | ICD-10-CM | POA: Diagnosis present

## 2022-12-06 DIAGNOSIS — E871 Hypo-osmolality and hyponatremia: Secondary | ICD-10-CM | POA: Diagnosis present

## 2022-12-06 DIAGNOSIS — W1830XA Fall on same level, unspecified, initial encounter: Secondary | ICD-10-CM | POA: Diagnosis present

## 2022-12-06 DIAGNOSIS — I2694 Multiple subsegmental pulmonary emboli without acute cor pulmonale: Secondary | ICD-10-CM | POA: Diagnosis not present

## 2022-12-06 DIAGNOSIS — E876 Hypokalemia: Secondary | ICD-10-CM | POA: Diagnosis present

## 2022-12-06 DIAGNOSIS — G720 Drug-induced myopathy: Secondary | ICD-10-CM | POA: Diagnosis present

## 2022-12-06 DIAGNOSIS — Z66 Do not resuscitate: Secondary | ICD-10-CM | POA: Diagnosis present

## 2022-12-06 DIAGNOSIS — I2692 Saddle embolus of pulmonary artery without acute cor pulmonale: Secondary | ICD-10-CM | POA: Diagnosis not present

## 2022-12-06 DIAGNOSIS — I700 Atherosclerosis of aorta: Secondary | ICD-10-CM | POA: Diagnosis present

## 2022-12-06 DIAGNOSIS — Z9484 Stem cells transplant status: Secondary | ICD-10-CM | POA: Diagnosis not present

## 2022-12-06 DIAGNOSIS — E538 Deficiency of other specified B group vitamins: Secondary | ICD-10-CM | POA: Diagnosis present

## 2022-12-06 DIAGNOSIS — W19XXXA Unspecified fall, initial encounter: Secondary | ICD-10-CM | POA: Diagnosis present

## 2022-12-06 DIAGNOSIS — E7220 Disorder of urea cycle metabolism, unspecified: Secondary | ICD-10-CM | POA: Diagnosis present

## 2022-12-06 DIAGNOSIS — T380X5A Adverse effect of glucocorticoids and synthetic analogues, initial encounter: Secondary | ICD-10-CM | POA: Diagnosis present

## 2022-12-06 LAB — COMPREHENSIVE METABOLIC PANEL
ALT: 7 U/L (ref 0–44)
AST: 21 U/L (ref 15–41)
Albumin: 2.1 g/dL — ABNORMAL LOW (ref 3.5–5.0)
Alkaline Phosphatase: 47 U/L (ref 38–126)
Anion gap: 10 (ref 5–15)
BUN: <5 mg/dL — ABNORMAL LOW (ref 8–23)
CO2: 19 mmol/L — ABNORMAL LOW (ref 22–32)
Calcium: 7.9 mg/dL — ABNORMAL LOW (ref 8.9–10.3)
Chloride: 102 mmol/L (ref 98–111)
Creatinine, Ser: 0.65 mg/dL (ref 0.44–1.00)
GFR, Estimated: >60 mL/min (ref >60)
Glucose, Bld: 75 mg/dL (ref 70–99)
Potassium: 3.2 mmol/L — ABNORMAL LOW (ref 3.5–5.1)
Sodium: 131 mmol/L — ABNORMAL LOW (ref 135–145)
Total Bilirubin: 1.6 mg/dL — ABNORMAL HIGH (ref 0.3–1.2)
Total Protein: 6 g/dL — ABNORMAL LOW (ref 6.5–8.1)

## 2022-12-06 LAB — GLUCOSE, CAPILLARY
Glucose-Capillary: 74 mg/dL (ref 70–99)
Glucose-Capillary: 77 mg/dL (ref 70–99)
Glucose-Capillary: 87 mg/dL (ref 70–99)
Glucose-Capillary: 82 mg/dL (ref 70–99)
Glucose-Capillary: 133 mg/dL — ABNORMAL HIGH (ref 70–99)
Glucose-Capillary: 124 mg/dL — ABNORMAL HIGH (ref 70–99)

## 2022-12-06 LAB — CBC
HCT: 25.9 % — ABNORMAL LOW (ref 36.0–46.0)
Hemoglobin: 8 g/dL — ABNORMAL LOW (ref 12.0–15.0)
MCH: 27.9 pg (ref 26.0–34.0)
MCHC: 30.9 g/dL (ref 30.0–36.0)
MCV: 90.2 fL (ref 80.0–100.0)
Platelets: 219 10*3/uL (ref 150–400)
RBC: 2.87 MIL/uL — ABNORMAL LOW (ref 3.87–5.11)
RDW: 22.6 % — ABNORMAL HIGH (ref 11.5–15.5)
WBC: 6.6 10*3/uL (ref 4.0–10.5)
nRBC: 0 % (ref 0.0–0.2)

## 2022-12-06 LAB — PHOSPHORUS: Phosphorus: 1.4 mg/dL — ABNORMAL LOW (ref 2.5–4.6)

## 2022-12-06 LAB — HEPARIN LEVEL (UNFRACTIONATED)
Heparin Unfractionated: 0.73 IU/mL — ABNORMAL HIGH (ref 0.30–0.70)
Heparin Unfractionated: 0.6 IU/mL (ref 0.30–0.70)
Heparin Unfractionated: 0.74 IU/mL — ABNORMAL HIGH (ref 0.30–0.70)

## 2022-12-06 LAB — MAGNESIUM: Magnesium: 1.5 mg/dL — ABNORMAL LOW (ref 1.7–2.4)

## 2022-12-06 MED ORDER — DEXAMETHASONE SODIUM PHOSPHATE 4 MG/ML IJ SOLN
4.0000 mg | INTRAMUSCULAR | Status: DC
  Administered 2022-12-07 – 2022-12-11 (×3): 4 mg via INTRAVENOUS
  Filled 2022-12-06 (×3): qty 1

## 2022-12-06 MED ORDER — POTASSIUM PHOSPHATES 15 MMOLE/5ML IV SOLN
15.0000 mmol | Freq: Once | INTRAVENOUS | Status: AC
  Administered 2022-12-06: 15 mmol via INTRAVENOUS
  Filled 2022-12-06: qty 5

## 2022-12-06 MED ORDER — DEXAMETHASONE SODIUM PHOSPHATE 4 MG/ML IJ SOLN: 4.0000 mg | INTRAMUSCULAR | Status: DC

## 2022-12-06 MED ORDER — PROCHLORPERAZINE EDISYLATE 10 MG/2ML IJ SOLN
10.0000 mg | Freq: Four times a day (QID) | INTRAMUSCULAR | Status: DC | PRN
  Administered 2022-12-06: 10 mg via INTRAVENOUS
  Filled 2022-12-06 (×2): qty 2

## 2022-12-06 MED ORDER — DEXAMETHASONE 2 MG PO TABS
2.0000 mg | ORAL_TABLET | ORAL | Status: DC
  Filled 2022-12-06: qty 1

## 2022-12-06 MED ORDER — ADULT MULTIVITAMIN W/MINERALS CH
1.0000 | ORAL_TABLET | Freq: Every day | ORAL | Status: DC
  Administered 2022-12-07: 1 via ORAL
  Filled 2022-12-06 (×7): qty 1

## 2022-12-06 MED ORDER — POTASSIUM CHLORIDE CRYS ER 20 MEQ PO TBCR
30.0000 meq | EXTENDED_RELEASE_TABLET | Freq: Once | ORAL | Status: AC
  Administered 2022-12-06: 20 meq via ORAL
  Filled 2022-12-06: qty 1

## 2022-12-06 MED ORDER — DEXAMETHASONE 4 MG PO TABS: 4.0000 mg | ORAL_TABLET | ORAL | Status: DC

## 2022-12-06 MED ORDER — MAGNESIUM SULFATE 2 GM/50ML IV SOLN
2.0000 g | Freq: Once | INTRAVENOUS | Status: AC
  Administered 2022-12-06: 2 g via INTRAVENOUS
  Filled 2022-12-06 (×2): qty 50

## 2022-12-06 MED ORDER — DEXAMETHASONE SODIUM PHOSPHATE 4 MG/ML IJ SOLN
2.0000 mg | INTRAMUSCULAR | Status: DC
  Administered 2022-12-06 – 2022-12-10 (×3): 2 mg via INTRAVENOUS
  Filled 2022-12-06 (×4): qty 1

## 2022-12-06 MED ORDER — DRONABINOL 5 MG PO CAPS
5.0000 mg | ORAL_CAPSULE | Freq: Two times a day (BID) | ORAL | Status: DC
  Administered 2022-12-07 – 2022-12-12 (×11): 5 mg via ORAL
  Filled 2022-12-06: qty 1
  Filled 2022-12-06 (×3): qty 2
  Filled 2022-12-06: qty 1
  Filled 2022-12-06 (×2): qty 2
  Filled 2022-12-06 (×2): qty 1
  Filled 2022-12-06: qty 2
  Filled 2022-12-06 (×2): qty 1

## 2022-12-06 NOTE — Progress Notes (Signed)
ANTICOAGULATION CONSULT NOTE  Pharmacy Consult for heparin Indication: pulmonary embolus  Allergies  Allergen Reactions   Augmentin [Amoxicillin-Pot Clavulanate] Hives, Itching, Nausea And Vomiting and Rash    ++Tolerates Cefepime++ Has patient had a PCN reaction causing immediate rash, facial/tongue/throat swelling, SOB or lightheadedness with hypotension: Yes Has patient had a PCN reaction causing severe rash involving mucus membranes or skin necrosis: Yes Has patient had a PCN reaction that required hospitalization No Has patient had a PCN reaction occurring within the last 10 years: Yes If all of the above answers are "NO", then may proceed with Cephalosporin use. *okay to take other types of cillin   Albuterol Other (See Comments)    Elevated HR and BP   Codeine Nausea Only   Ibuprofen Nausea Only   Nsaids Other (See Comments)    Gi Upset   Penicillin G     Other Reaction(s): severe rash involving mucus membrane   Tolmetin Other (See Comments)    Gi Upset   Tetanus-Diphth-Acell Pertussis Rash    Received injection in ER on 8/1, developed fine rash all over body. Cellulitis to injection site on left deltoid.    Patient Measurements: Height: 5' (152.4 cm) Weight: 59.9 kg (132 lb) IBW/kg (Calculated) : 45.5 Heparin Dosing Weight: 60 kg  Vital Signs: Temp: 98.1 F (36.7 C) (04/29 1942) Temp Source: Oral (04/29 1942) BP: 132/74 (04/29 1942) Pulse Rate: 97 (04/29 1942)  Labs: Recent Labs    12/05/22 1016 12/05/22 1041 12/05/22 2209 12/06/22 0422 12/06/22 1420 12/06/22 2155  HGB 10.6* 11.9*  --  8.0*  --   --   HCT 33.3* 35.0*  --  25.9*  --   --   PLT 260  --   --  219  --   --   LABPROT 13.3  --   --   --   --   --   INR 1.0  --   --   --   --   --   HEPARINUNFRC  --   --   --  0.73* 0.60 0.74*  CREATININE 0.75 0.60  --  0.65  --   --   TROPONINIHS 9  --  20*  --   --   --      Estimated Creatinine Clearance: 50.7 mL/min (by C-G formula based on SCr of  0.65 mg/dL).  Assessment: 72 yo F presents with failure to thrive / weakness. CT results show bilateral PEs. No R heart strain. Hgb 11.9, plts wnl. No s/s of bleed. No anticoag PTA.  12/06/2022: (PM) Follow-up heparin level decreased to therapeutic range after rate adjusted down to 850 units/hr CBC: Hg 8- low & decreased from admission; pltc WNL No bleeding or infusion related concerns reported by RN  2155 HL 0.74 slightly supra-therapeutic on 850 units/hr  Goal of Therapy:  Heparin level 0.3-0.7 units/ml Monitor platelets by anticoagulation protocol: Yes   Plan:  Decrease heparin drip to 800 units/hr Recheck 8 hr heparin level  Monitor CBC, s/s of bleed F/u plans for long-term anticoagulation  Arley Phenix RPh 12/06/2022, 11:05 PM

## 2022-12-06 NOTE — Progress Notes (Signed)
PROGRESS NOTE  Jessica [name] ZOX:[id number] DOB: 1951/07/05 DOA: 12/05/2022 PCP: Emilio Aspen, MD   LOS: 0 days   Brief Narrative / Interim history: 72 year old female with history of multiple myeloma, currently not a candidate for therapy due to progressive weakness and decreased performance level, in fact with oncology recommending palliative/hospice, history of steroid-induced myopathy, anemia of chronic disease, neuropathy comes into the hospital for failure to thrive, generalized weakness, poor appetite and poor oral intake progressive over the last several weeks but more so in the last 4 days.  She has had recurrent falls, also a lot of postprandial nausea and vomiting.  She has not been able to eat in the last 2 days.  Imaging in the ED showed bilateral PEs without heart strain or pulmonary infarction, and CT scan of the abdomen and pelvis was without acute findings.  Subjective / 24h Interval events: Complains of generalized weakness and nausea at this morning.  No chest discomfort, no abdominal discomfort.  No shortness of breath.  Assesement and Plan: Principal Problem:   Pulmonary embolism (HCC) Active Problems:   Multiple myeloma (HCC)   Hypokalemia   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   Chronic GERD   Generalized weakness   Steroid myopathy   Failure to thrive in adult   Hypoglycemia   Hyperammonemia (HCC)   Hypoalbuminemia due to protein-calorie malnutrition (HCC)   Abrasion of left leg   Principal problem Acute pulmonary embolism -CT angiogram of chest with contrast was suggestive of bilateral lower lobe and right middle lobe segmental and subsegmental pulmonary emboli.  Started on heparin drip, continue for now.  No clinical bleeding, however hemoglobin dipped from 10-11 range to 8.0 this morning.  Active problems Generalized weakness, FTT -after reviewing outpatient notes this seems to be a problem that is somewhat chronic rather than acute.  Last saw Dr.  Bertis Ruddy in January 2024, due to decline in her performance was not a candidate for further treatment.  It is less likely to improve and she recommended consideration for palliative care and hospice.  Palliative care consulted.  Hypoglycemia - Blood glucose dropped to 56, patient continues to have poor oral intake. Continue D5  Hypokalemia, hypomagnesemia - Continue to replete today, continue to monitor closely  Hyperammonemia - Ammonia on admission borderline @ 36, patient with hepatic steatosis. Continue Lactulose  Left leg abrasion wound - Wound care nurse will be consulted   Severe protein calorie malnutrition - RD consult  Multiple myeloma - Patient follows with oncology (Dr. Bertis Ruddy)  with last visit being in January of this year   Steroid myopathy - Continue Decadron   Anemia of chronic illness - Hemoglobin is stable but noted drop this morning, continue to keep on heparin.  May be dilutional  Goals of care - Palliative care will be consulted  Scheduled Meds:  feeding supplement  237 mL Oral BID BM   lactulose  20 g Oral TID   pantoprazole (PROTONIX) IV  40 mg Intravenous Q24H   Continuous Infusions:  sodium chloride Stopped (12/05/22 1852)   dextrose 5 % and 0.9% NaCl 40 mL/hr at 12/05/22 2207   heparin 850 Units/hr (12/06/22 0628)   potassium PHOSPHATE IVPB (in mmol)     PRN Meds:.sodium chloride, acetaminophen **OR** acetaminophen, dextrose, dextrose, fentaNYL (SUBLIMAZE) injection, ondansetron **OR** ondansetron (ZOFRAN) IV, prochlorperazine  Current Outpatient Medications  Medication Instructions   acetaminophen (TYLENOL) 500-1,000 mg, Oral, Every 6 hours PRN   acyclovir (ZOVIRAX) 400 mg, Oral, Daily   cyanocobalamin (  VITAMIN B12) 500 mcg, Oral, Daily   dexamethasone (DECADRON) 2-4 mg, Oral, See admin instructions, Take 1 tablet alternate with 1/2 tablet every other day   lidocaine-prilocaine (EMLA) cream Apply to affected area once   nystatin (MYCOSTATIN/NYSTOP)  powder 1 Application, Topical, 2 times daily   omeprazole (PRILOSEC) 20 MG capsule TAKE 1 CAPSULE(20 MG) BY MOUTH DAILY   ondansetron (ZOFRAN) 8 MG tablet TAKE 1 TABLET(8 MG) BY MOUTH TWICE DAILY AS NEEDED FOR NAUSEA OR VOMITING   prochlorperazine (COMPAZINE) 10 mg, Oral, Every 6 hours PRN    Diet Orders (From admission, onward)     Start     Ordered   12/05/22 1817  Diet Heart Room service appropriate? Yes; Fluid consistency: Thin  Diet effective now       Question Answer Comment  Room service appropriate? Yes   Fluid consistency: Thin      12/05/22 1817            DVT prophylaxis: SCDs Start: 12/05/22 1816   Lab Results  Component Value Date   PLT 219 12/06/2022      Code Status: Full Code  Family Communication: no family at bedside   Status is: Observation The patient will require care spanning > 2 midnights and should be moved to inpatient because: Essentially bedbound, hemoglobin drop, goals of care discussions, severe electrolyte imbalances  Level of care: Progressive  Consultants:  Palliative   Objective: Vitals:   12/06/22 0000 12/06/22 0123 12/06/22 0428 12/06/22 0901  BP:  (!) 117/53 125/72 (!) 109/59  Pulse: (!) 113 (!) 110 (!) 112 (!) 102  Resp:  17 18 (!) 24  Temp:  98.8 F (37.1 C) 98.9 F (37.2 C) 98.8 F (37.1 C)  TempSrc:  Oral Oral Oral  SpO2:  100% 98% (!) 89%  Weight:      Height:        Intake/Output Summary (Last 24 hours) at 12/06/2022 1030 Last data filed at 12/05/2022 1852 Gross per 24 hour  Intake 750 ml  Output --  Net 750 ml   Wt Readings from Last 3 Encounters:  12/05/22 59.9 kg  08/24/22 60.1 kg  08/12/22 63.1 kg    Examination:  Constitutional: NAD, pale appearing Eyes: no scleral icterus ENMT: Mucous membranes are moist.  Neck: normal, supple Respiratory: clear to auscultation bilaterally, no wheezing, no crackles.  Cardiovascular: Regular rate and rhythm, no murmurs / rubs / gallops.  Abdomen: non distended,  no tenderness. Bowel sounds positive.  Musculoskeletal: no clubbing / cyanosis.   Data Reviewed: I have independently reviewed following labs and imaging studies   CBC Recent Labs  Lab 12/05/22 1016 12/05/22 1041 12/06/22 0422  WBC 8.4  --  6.6  HGB 10.6* 11.9* 8.0*  HCT 33.3* 35.0* 25.9*  PLT 260  --  219  MCV 87.9  --  90.2  MCH 28.0  --  27.9  MCHC 31.8  --  30.9  RDW 22.3*  --  22.6*  LYMPHSABS 5.0*  --   --   MONOABS 0.7  --   --   EOSABS 0.0  --   --   BASOSABS 0.1  --   --     Recent Labs  Lab 12/05/22 1016 12/05/22 1017 12/05/22 1034 12/05/22 1041 12/05/22 2209 12/06/22 0422  NA 131*  --   --  135  --  131*  K 2.6*  --   --  2.5*  --  3.2*  CL 96*  --   --  96*  --  102  CO2 19*  --   --   --   --  19*  GLUCOSE 77  --   --  89  --  75  BUN <5*  --   --  <3*  --  <5*  CREATININE 0.75  --   --  0.60  --  0.65  CALCIUM 8.6*  --   --   --   --  7.9*  AST 28  --   --   --   --  21  ALT 9  --   --   --   --  7  ALKPHOS 62  --   --   --   --  47  BILITOT 2.2*  --   --   --   --  1.6*  ALBUMIN 2.8*  --   --   --   --  2.1*  MG 1.8  --   --   --   --  1.5*  LATICACIDVEN  --  1.5  --   --  1.4  --   INR 1.0  --   --   --   --   --   TSH  --   --  0.645  --   --   --   AMMONIA  --  36*  --   --   --   --     ------------------------------------------------------------------------------------------------------------------ No results for input(s): "CHOL", "HDL", "LDLCALC", "TRIG", "CHOLHDL", "LDLDIRECT" in the last 72 hours.  Lab Results  Component Value Date   HGBA1C 4.9 08/13/2022   ------------------------------------------------------------------------------------------------------------------ Recent Labs    12/05/22 1034  TSH 0.645    Cardiac Enzymes No results for input(s): "CKMB", "TROPONINI", "MYOGLOBIN" in the last 168 hours.  Invalid input(s):  "CK" ------------------------------------------------------------------------------------------------------------------ No results found for: "BNP"  CBG: Recent Labs  Lab 12/05/22 2246 12/05/22 2344 12/06/22 0339 12/06/22 0424 12/06/22 0837  GLUCAP 69* 131* 77 74 87    No results found for this or any previous visit (from the past 240 hour(s)).   Radiology Studies: CT ABDOMEN PELVIS W CONTRAST  Result Date: 12/05/2022 CLINICAL DATA:  Pulmonary embolism (PE) suspected, high prob; Abdominal pain, acute, nonlocalized Multiple myeloma, weakness EXAM: CT ANGIOGRAPHY CHEST CT ABDOMEN AND PELVIS WITH CONTRAST TECHNIQUE: Multidetector CT imaging of the chest was performed using the standard protocol during bolus administration of intravenous contrast. Multiplanar CT image reconstructions and MIPs were obtained to evaluate the vascular anatomy. Multidetector CT imaging of the abdomen and pelvis was performed using the standard protocol during bolus administration of intravenous contrast. RADIATION DOSE REDUCTION: This exam was performed according to the departmental dose-optimization program which includes automated exposure control, adjustment of the mA and/or kV according to patient size and/or use of iterative reconstruction technique. CONTRAST:  OMNIPAQUE IOHEXOL 350 MG/ML SOLN COMPARISON:  PetCT 04/20/2019 FINDINGS: CTA CHEST FINDINGS Cardiovascular: Right chest wall Port-A-Cath with tip at the superior cavoatrial junction. Satisfactory opacification of the pulmonary arteries to the segmental level. Right lower lobe and right middle lobe filling defect at the segmental and subsegmental level. Left lower lobe lung filling defect at the segmental and subsegmental levels. Left ventricular hypertrophy. No significant pericardial effusion. The thoracic aorta is normal in caliber. Mild atherosclerotic plaque of the thoracic aorta. No coronary artery calcifications. Mediastinum/Nodes: No enlarged  mediastinal, hilar, or axillary lymph nodes. Thyroid gland, trachea, and esophagus demonstrate no significant findings. Tiny hiatal hernia. Lungs/Pleura: Bilateral lower lobe subsegmental atelectasis.  No focal consolidation. No pulmonary nodule. No pulmonary mass. No pleural effusion. No pneumothorax. Musculoskeletal: No chest wall abnormality. No suspicious lytic or blastic osseous lesions. No acute displaced fracture. Review of the MIP images confirms the above findings. CT ABDOMEN and PELVIS FINDINGS Hepatobiliary: The hepatic parenchyma is diffusely hypodense compared to the splenic parenchyma consistent with fatty infiltration. No focal liver abnormality. No gallstones, gallbladder wall thickening, or pericholecystic fluid. No biliary dilatation. Pancreas: No focal lesion. Normal pancreatic contour. No surrounding inflammatory changes. No main pancreatic ductal dilatation. Spleen: Normal in size without focal abnormality. Adrenals/Urinary Tract: No adrenal nodule bilaterally. Bilateral kidneys enhance symmetrically. Bilateral fluid density lesions likely represent simple renal cysts. Simple renal cysts, in the absence of clinically indicated signs/symptoms, require no independent follow-up. Subcentimeter hypodensity too small to characterize-no further follow-up indicated. No hydronephrosis. No hydroureter. The urinary bladder is unremarkable. Stomach/Bowel: Stomach is within normal limits. No evidence of bowel wall thickening or dilatation. Appendix appears normal. Vascular/Lymphatic: No abdominal aorta or iliac aneurysm. No abdominal, pelvic, or inguinal lymphadenopathy. Reproductive: Uterus and bilateral adnexa are unremarkable. Other: No intraperitoneal free fluid. No intraperitoneal free gas. No organized fluid collection. Musculoskeletal: No abdominal wall hernia or abnormality. No suspicious lytic or blastic osseous lesions. No acute displaced fracture. Review of the MIP images confirms the above  findings. IMPRESSION: 1. Bilateral lower lobe and right middle lobe segmental and subsegmental pulmonary emboli. No associated heart strain or pulmonary infarction. 2. Left ventricular hypertrophy. 3. Tiny hiatal hernia. 4. Hepatic steatosis. 5.  Aortic Atherosclerosis (ICD10-I70.0). 6. No osseous lesions to suggest multiple myeloma recurrence or metastasis. These results were called by telephone at the time of interpretation on 12/05/2022 at 5:06 pm to provider Dr. [place], who verbally acknowledged these results. Electronically Signed   By: Tish Frederickson M.D.   On: 12/05/2022 17:11   CT Angio Chest PE W and/or Wo Contrast  Result Date: 12/05/2022 CLINICAL DATA:  Pulmonary embolism (PE) suspected, high prob; Abdominal pain, acute, nonlocalized Multiple myeloma, weakness EXAM: CT ANGIOGRAPHY CHEST CT ABDOMEN AND PELVIS WITH CONTRAST TECHNIQUE: Multidetector CT imaging of the chest was performed using the standard protocol during bolus administration of intravenous contrast. Multiplanar CT image reconstructions and MIPs were obtained to evaluate the vascular anatomy. Multidetector CT imaging of the abdomen and pelvis was performed using the standard protocol during bolus administration of intravenous contrast. RADIATION DOSE REDUCTION: This exam was performed according to the departmental dose-optimization program which includes automated exposure control, adjustment of the mA and/or kV according to patient size and/or use of iterative reconstruction technique. CONTRAST:  OMNIPAQUE IOHEXOL 350 MG/ML SOLN COMPARISON:  PetCT 04/20/2019 FINDINGS: CTA CHEST FINDINGS Cardiovascular: Right chest wall Port-A-Cath with tip at the superior cavoatrial junction. Satisfactory opacification of the pulmonary arteries to the segmental level. Right lower lobe and right middle lobe filling defect at the segmental and subsegmental level. Left lower lobe lung filling defect at the segmental and subsegmental levels. Left  ventricular hypertrophy. No significant pericardial effusion. The thoracic aorta is normal in caliber. Mild atherosclerotic plaque of the thoracic aorta. No coronary artery calcifications. Mediastinum/Nodes: No enlarged mediastinal, hilar, or axillary lymph nodes. Thyroid gland, trachea, and esophagus demonstrate no significant findings. Tiny hiatal hernia. Lungs/Pleura: Bilateral lower lobe subsegmental atelectasis. No focal consolidation. No pulmonary nodule. No pulmonary mass. No pleural effusion. No pneumothorax. Musculoskeletal: No chest wall abnormality. No suspicious lytic or blastic osseous lesions. No acute displaced fracture. Review of the MIP images confirms the above findings. CT ABDOMEN  and PELVIS FINDINGS Hepatobiliary: The hepatic parenchyma is diffusely hypodense compared to the splenic parenchyma consistent with fatty infiltration. No focal liver abnormality. No gallstones, gallbladder wall thickening, or pericholecystic fluid. No biliary dilatation. Pancreas: No focal lesion. Normal pancreatic contour. No surrounding inflammatory changes. No main pancreatic ductal dilatation. Spleen: Normal in size without focal abnormality. Adrenals/Urinary Tract: No adrenal nodule bilaterally. Bilateral kidneys enhance symmetrically. Bilateral fluid density lesions likely represent simple renal cysts. Simple renal cysts, in the absence of clinically indicated signs/symptoms, require no independent follow-up. Subcentimeter hypodensity too small to characterize-no further follow-up indicated. No hydronephrosis. No hydroureter. The urinary bladder is unremarkable. Stomach/Bowel: Stomach is within normal limits. No evidence of bowel wall thickening or dilatation. Appendix appears normal. Vascular/Lymphatic: No abdominal aorta or iliac aneurysm. No abdominal, pelvic, or inguinal lymphadenopathy. Reproductive: Uterus and bilateral adnexa are unremarkable. Other: No intraperitoneal free fluid. No intraperitoneal free  gas. No organized fluid collection. Musculoskeletal: No abdominal wall hernia or abnormality. No suspicious lytic or blastic osseous lesions. No acute displaced fracture. Review of the MIP images confirms the above findings. IMPRESSION: 1. Bilateral lower lobe and right middle lobe segmental and subsegmental pulmonary emboli. No associated heart strain or pulmonary infarction. 2. Left ventricular hypertrophy. 3. Tiny hiatal hernia. 4. Hepatic steatosis. 5.  Aortic Atherosclerosis (ICD10-I70.0). 6. No osseous lesions to suggest multiple myeloma recurrence or metastasis. These results were called by telephone at the time of interpretation on 12/05/2022 at 5:06 pm to provider Dr. [place], who verbally acknowledged these results. Electronically Signed   By: Tish Frederickson M.D.   On: 12/05/2022 17:11     Pamella Pert, MD, PhD [facility]  Between 7 am - 7 pm I am available, please contact me via Amion (for emergencies) or Securechat (non urgent messages)  Between 7 pm - 7 am I am not available, please contact night coverage MD/APP via Amion

## 2022-12-06 NOTE — Progress Notes (Addendum)
Patient has refused PO medications. She reports recurrent nausea.  Additionally, SCD's and prevalon boots placed.The patient reports pain/discomfort. Pt request removal of these items and refused reapplication.

## 2022-12-06 NOTE — Progress Notes (Signed)
ANTICOAGULATION CONSULT NOTE  Pharmacy Consult for heparin Indication: pulmonary embolus  Allergies  Allergen Reactions   Augmentin [Amoxicillin-Pot Clavulanate] Hives, Itching, Nausea And Vomiting and Rash    ++Tolerates Cefepime++ Has patient had a PCN reaction causing immediate rash, facial/tongue/throat swelling, SOB or lightheadedness with hypotension: Yes Has patient had a PCN reaction causing severe rash involving mucus membranes or skin necrosis: Yes Has patient had a PCN reaction that required hospitalization No Has patient had a PCN reaction occurring within the last 10 years: Yes If all of the above answers are "NO", then may proceed with Cephalosporin use. *okay to take other types of cillin   Albuterol Other (See Comments)    Elevated HR and BP   Codeine Nausea Only   Ibuprofen Nausea Only   Nsaids Other (See Comments)    Gi Upset   Penicillin G     Other Reaction(s): severe rash involving mucus membrane   Tolmetin Other (See Comments)    Gi Upset   Tetanus-Diphth-Acell Pertussis Rash    Received injection in ER on 8/1, developed fine rash all over body. Cellulitis to injection site on left deltoid.    Patient Measurements: Height: 5' (152.4 cm) Weight: 59.9 kg (132 lb) IBW/kg (Calculated) : 45.5 Heparin Dosing Weight: 60 kg  Vital Signs: Temp: 98.1 F (36.7 C) (04/29 1240) Temp Source: Oral (04/29 0901) BP: 150/60 (04/29 1240) Pulse Rate: 106 (04/29 1240)  Labs: Recent Labs    12/05/22 1016 12/05/22 1041 12/05/22 2209 12/06/22 0422 12/06/22 1420  HGB 10.6* 11.9*  --  8.0*  --   HCT 33.3* 35.0*  --  25.9*  --   PLT 260  --   --  219  --   LABPROT 13.3  --   --   --   --   INR 1.0  --   --   --   --   HEPARINUNFRC  --   --   --  0.73* 0.60  CREATININE 0.75 0.60  --  0.65  --   TROPONINIHS 9  --  20*  --   --      Estimated Creatinine Clearance: 50.7 mL/min (by C-G formula based on SCr of 0.65 mg/dL).  Assessment: 72 yo F presents with failure  to thrive / weakness. CT results show bilateral PEs. No R heart strain. Hgb 11.9, plts wnl. No s/s of bleed. No anticoag PTA.  12/06/2022: (PM) Follow-up heparin level decreased to therapeutic range after rate adjusted down to 850 units/hr CBC: Hg 8- low & decreased from admission; pltc WNL No bleeding or infusion related concerns reported by RN  Goal of Therapy:  Heparin level 0.3-0.7 units/ml Monitor platelets by anticoagulation protocol: Yes   Plan:  Continue IV heparin at 850 units/hr Recheck 8 hr heparin level (confirmatory) Monitor CBC, s/s of bleed F/u plans for long-term anticoagulation  Bernadene Person, PharmD, BCPS [phone] 12/06/2022, 4:05 PM

## 2022-12-06 NOTE — Progress Notes (Signed)
Initial Nutrition Assessment  DOCUMENTATION CODES:   Not applicable  INTERVENTION:  - Liberalize to a Regular diet to avoid restricting intake.  - Magic cup TID with meals, each supplement provides 290 kcal and 9 grams of protein - Encourage intake as tolerated.  - Plan to start Marinol to alleviate N/V and promote appetite.  - Continue antiemetics. - Multivitamin with minerals daily - Monitor weight trends.    NUTRITION DIAGNOSIS:   Inadequate oral intake related to poor appetite, nausea, vomiting, chronic illness (multiple myeloma) as evidenced by energy intake < 75% for > or equal to 1 month.  GOAL:   Patient will meet greater than or equal to 90% of their needs  MONITOR:   PO intake, Supplement acceptance, Weight trends  REASON FOR ASSESSMENT:   Consult Assessment of nutrition requirement/status  ASSESSMENT:   72 year old female with PMH of multiple myeloma (currently not a candidate for therapy), anemia of chronic disease, neuropathy who presented with failure to thrive, generalized weakness, poor appetite and oral intake progressive over the last several weeks.  Patient sleeping at time of visit, husband at bedside.   Husband reports patient's UBW to be around 128-130#.   Did not feel she has lost any weight recently. Per EMR, patient had weight gain from August to December and has since been holding steady.  He reports patient usually eats around 2 small meals and a snack in between. Often has a bacon and egg biscuit with a hashbrown for breakfast, a snack of cinnabon for lunch, and a dinner at their local home cooking restaurant.  Over the past few weeks she has been eating less and less at each meal. He reports she has had almost nothing to eat over the past 2 weeks. Intake has also been limiting by ongoing nausea and vomiting. Shares that she had a "brain surgery" in January 2016 and since that time has been nauseous on and off. They have not ever found anything  that really alleviates her nausea except for Marinol, which she was recently given at [facility]. Husband inquiring if this could be provided during her admission here. Informed husband I would reach out to her attending MD and ask.  Per discussion with MD, patient can have Marinol and Marinol now ordered.  Husband states he does not think patient has had anything to eat since admission. Per EMR, patient documented to have had 0% of all meals.  Husband has tried to get patient to drink nutrition supplements but she had not tried any by the time she was admitted. He feels she may be agreeable to try Magic Cup as she enjoys ice cream, will order. Encouraged husband to always order a tray to have food available for patient in case she feels able to eat something.   Of note, palliative care consulted with recommendations pending.   Medications reviewed and include: Zofran, Compazine  Labs reviewed:  Na 131 K+ 3.2 Magnesium 1.4 Phosphorus 1.5   NUTRITION - FOCUSED PHYSICAL EXAM:  Patient had just gone to sleep after feeling nauseous and in pain all day, husband requested to postpone NFPE  Diet Order:   Diet Order             Diet regular Room service appropriate? Yes; Fluid consistency: Thin  Diet effective now                   EDUCATION NEEDS:  Education needs have been addressed  Skin:  Skin Assessment: Reviewed RN Assessment  Last BM:  4/28  Height:  Ht Readings from Last 1 Encounters:  12/05/22 5' (1.524 m)   Weight:  Wt Readings from Last 1 Encounters:  12/05/22 59.9 kg    BMI:  Body mass index is 25.78 kg/m.  Estimated Nutritional Needs:  Kcal:  1800-2000 kcals Protein:  70-85 grams Fluid:  >/= 1.8L   Shelle Iron RD, LDN For contact information, refer to [facility].

## 2022-12-06 NOTE — Consult Note (Signed)
Palliative Care Consult Note                                  Date: 12/06/2022   Patient Name: Jessica [name]  DOB: [birth year]  MRN: [id number]  Age / Sex: 72 y.o., female  PCP: Emilio Aspen, MD Referring Physician: Leatha Gilding, MD  Reason for Consultation: Establishing goals of care  HPI/Patient Profile: 72 y.o. female  with past medical history of multiple myeloma currently not a candidate for therapy due to progressive weakness and decreased performance status, history of steroid-induced myopathy, anemia of chronic disease, and neuropathy who presented to [facility] ED on 12/05/2022 with generalized weakness, poor oral intake, and failure to thrive. Imaging in the ED showed bilateral PE.  Palliative Medicine was consulted for goals of care  Past Medical History:  Diagnosis Date   H/O stem cell transplant [facility]) 03/2015   [facility]   Multiple myeloma [facility]) 11/19/2014   Multiple myeloma (HCC)    Multiple myeloma (HCC)    MVP (mitral valve prolapse)    req prophylaxis   Other constipation 12/03/2014   Right ovarian cyst 10/16/15   16 mm simple cyst - ultrasound yearly.   Upper respiratory infection, acute 08/12/2015    Subjective:   I have reviewed medical records including EPIC notes, labs and imaging, received report from the team, and assessed the patient at bedside.   I met with *** to discuss diagnosis, prognosis, GOC, EOL wishes, disposition, and options.  Patient is known to PMT from her previous hospitalization in January 2024. I re-introduced Palliative Medicine as specialized medical care for people living with serious illness. It focuses on providing relief from the symptoms and stress of a serious illness.   We discussed patient's current illness and what it means in the larger context of his/her ongoing co-morbidities. Current clinical status was reviewed. Natural disease trajectory of *** was  discussed.  Created space and opportunity for patient and family to explore thoughts and feelings regarding current medical situation.  Values and goals of care important to patient and family were attempted to be elicited.  A discussion was had today regarding advanced directives. Concepts specific to code status, artifical feeding and hydration, continued IV antibiotics and rehospitalization was had.  The MOST form was introduced and discussed.  Questions and concerns addressed. Patient/family encouraged to call with questions or concerns.     Life Review: ***  Functional Status: ***  Patient/Family Understanding of Illness: ***  Patient Values: ***  Goals: ***  Additional Discussion: ***  Review of Systems  Objective:   Primary Diagnoses: Present on Admission:  Pulmonary embolism (HCC)  Failure to thrive in adult  Hypokalemia  Multiple myeloma (HCC)  Steroid myopathy  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  Chronic GERD  Myopathy   Physical Exam  Vital Signs:  BP (!) 150/60 (BP Location: Left Arm)   Pulse (!) 106   Temp 98.1 F (36.7 C)   Resp 18   Ht 5' (1.524 m)   Wt 59.9 kg   LMP 08/10/2003   SpO2 100%   BMI 25.78 kg/m   Palliative Assessment/Data: ***     Assessment & Plan:   SUMMARY OF RECOMMENDATIONS   ***  Primary Decision Maker: {Primary Decision WUJWJ:19147}  Code Status/Advance Care Planning: {Palliative Code status:23503}  Symptom Management:  ***  Prognosis:  {Palliative Care Prognosis:23504}  Discharge Planning:  {Palliative  dispostion:23505}   Discussed with: ***    Thank you for allowing [place] to participate in the care of ANALYN MATUSEK   Time Total: ***  Greater than 50%  of this time was spent counseling and coordinating care related to the above assessment and plan.  Signed by: Sherlean Foot, NP Palliative Medicine Team  Team Phone # [phone]  For individual providers, please see  AMION

## 2022-12-06 NOTE — Consult Note (Signed)
WOC Nurse Consult Note: Reason for Consult:Left knee with nonhealing full thickness skin tear in patient with multiple comorbid conditions Wound type:Trauma from fall Pressure Injury POA: N/A Measurement:To be obtained by Bedside RN with application of next dressing today. Wound bed:red, moist Drainage (amount, consistency, odor) small serosanguinous Periwound:ecchymosis Dressing procedure/placement/frequency: I have provided nursing with guidance for the topical care of this traumatic wound using a daily NS cleanse and pat dry followed by placement of antimicrobial nonadherent (xeroform) gauze topped with dry gauze, an ABD pad for comfort and securing with Kerlix roll gauze/paper tape. The patient is to have a sacral silicone foam placed and bilateral Prevalon boots are provided for PI prevention.  WOC nursing team will not follow, but will remain available to this patient, the nursing and medical teams.  Please re-consult if needed.  Thank you for inviting [place] to participate in this patient's Plan of Care.  Ladona Mow, MSN, RN, CNS, GNP, Leda Min, [employer], [employer] phone:  [phone]

## 2022-12-06 NOTE — Progress Notes (Addendum)
ANTICOAGULATION CONSULT NOTE  Pharmacy Consult for heparin Indication: pulmonary embolus  Allergies  Allergen Reactions   Augmentin [Amoxicillin-Pot Clavulanate] Hives, Itching, Nausea And Vomiting and Rash    ++Tolerates Cefepime++ Has patient had a PCN reaction causing immediate rash, facial/tongue/throat swelling, SOB or lightheadedness with hypotension: Yes Has patient had a PCN reaction causing severe rash involving mucus membranes or skin necrosis: Yes Has patient had a PCN reaction that required hospitalization No Has patient had a PCN reaction occurring within the last 10 years: Yes If all of the above answers are "NO", then may proceed with Cephalosporin use. *okay to take other types of cillin   Albuterol Other (See Comments)    Elevated HR and BP   Codeine Nausea Only   Ibuprofen Nausea Only   Nsaids Other (See Comments)    Gi Upset   Penicillin G     Other Reaction(s): severe rash involving mucus membrane   Tolmetin Other (See Comments)    Gi Upset   Tetanus-Diphth-Acell Pertussis Rash    Received injection in ER on 8/1, developed fine rash all over body. Cellulitis to injection site on left deltoid.    Patient Measurements: Height: 5' (152.4 cm) Weight: 59.9 kg (132 lb) IBW/kg (Calculated) : 45.5 Heparin Dosing Weight: 60 kg  Vital Signs: Temp: 98.9 F (37.2 C) (04/29 0428) Temp Source: Oral (04/29 0428) BP: 125/72 (04/29 0428) Pulse Rate: 112 (04/29 0428)  Labs: Recent Labs    12/05/22 1016 12/05/22 1041 12/05/22 2209 12/06/22 0422  HGB 10.6* 11.9*  --  8.0*  HCT 33.3* 35.0*  --  25.9*  PLT 260  --   --  219  LABPROT 13.3  --   --   --   INR 1.0  --   --   --   HEPARINUNFRC  --   --   --  0.73*  CREATININE 0.75 0.60  --  0.65  TROPONINIHS 9  --  20*  --      Estimated Creatinine Clearance: 50.7 mL/min (by C-G formula based on SCr of 0.65 mg/dL).   Medical History: Past Medical History:  Diagnosis Date   H/O stem cell transplant [facility])  03/2015   [facility]   Multiple myeloma [facility]) 11/19/2014   Multiple myeloma (HCC)    Multiple myeloma (HCC)    MVP (mitral valve prolapse)    req prophylaxis   Other constipation 12/03/2014   Right ovarian cyst 10/16/15   16 mm simple cyst - ultrasound yearly.   Upper respiratory infection, acute 08/12/2015   Assessment: 72 yo F presents with failure to thrive / weakness. CT results show bilateral PEs. No R heart strain. Hgb 11.9, plts wnl. No s/s of bleed. No anticoag PTA.  12/06/2022: Initial heparin level 0.73- slightly above goal range on IV heparin 900 units/hr.  Verified lab drawn from opposite arm as heparin infusion.  CBC: Hg 8- low & decreased from admission; pltc WNL No bleeding or infusion related concerns reported by RN  Goal of Therapy:  Heparin level 0.3-0.7 units/ml Monitor platelets by anticoagulation protocol: Yes   Plan:  Decrease IV heparin slightly to 850 units/hr Recheck 8 hr heparin level after rate change Monitor CBC, s/s of bleed  Junita Push, PharmD, BCPS Clinical Pharmacist 12/06/2022 5:34 AM

## 2022-12-07 DIAGNOSIS — I2693 Single subsegmental pulmonary embolism without acute cor pulmonale: Secondary | ICD-10-CM | POA: Diagnosis not present

## 2022-12-07 LAB — COMPREHENSIVE METABOLIC PANEL
ALT: 7 U/L (ref 0–44)
AST: 19 U/L (ref 15–41)
Albumin: 2.2 g/dL — ABNORMAL LOW (ref 3.5–5.0)
Alkaline Phosphatase: 51 U/L (ref 38–126)
Anion gap: 8 (ref 5–15)
BUN: <5 mg/dL — ABNORMAL LOW (ref 8–23)
CO2: 20 mmol/L — ABNORMAL LOW (ref 22–32)
Calcium: 7.9 mg/dL — ABNORMAL LOW (ref 8.9–10.3)
Chloride: 104 mmol/L (ref 98–111)
Creatinine, Ser: 0.7 mg/dL (ref 0.44–1.00)
GFR, Estimated: >60 mL/min (ref >60)
Glucose, Bld: 102 mg/dL — ABNORMAL HIGH (ref 70–99)
Potassium: 4 mmol/L (ref 3.5–5.1)
Sodium: 132 mmol/L — ABNORMAL LOW (ref 135–145)
Total Bilirubin: 1.6 mg/dL — ABNORMAL HIGH (ref 0.3–1.2)
Total Protein: 6.2 g/dL — ABNORMAL LOW (ref 6.5–8.1)

## 2022-12-07 LAB — HEPARIN LEVEL (UNFRACTIONATED)
Heparin Unfractionated: 0.69 IU/mL (ref 0.30–0.70)
Heparin Unfractionated: 0.86 IU/mL — ABNORMAL HIGH (ref 0.30–0.70)

## 2022-12-07 LAB — CBC
HCT: 24.3 % — ABNORMAL LOW (ref 36.0–46.0)
Hemoglobin: 7.8 g/dL — ABNORMAL LOW (ref 12.0–15.0)
MCH: 28.4 pg (ref 26.0–34.0)
MCHC: 32.1 g/dL (ref 30.0–36.0)
MCV: 88.4 fL (ref 80.0–100.0)
Platelets: 201 10*3/uL (ref 150–400)
RBC: 2.75 MIL/uL — ABNORMAL LOW (ref 3.87–5.11)
RDW: 22.5 % — ABNORMAL HIGH (ref 11.5–15.5)
WBC: 5.3 10*3/uL (ref 4.0–10.5)
nRBC: 0.4 % — ABNORMAL HIGH (ref 0.0–0.2)

## 2022-12-07 LAB — GLUCOSE, CAPILLARY
Glucose-Capillary: 114 mg/dL — ABNORMAL HIGH (ref 70–99)
Glucose-Capillary: 91 mg/dL (ref 70–99)

## 2022-12-07 MED ORDER — OXYCODONE HCL 5 MG PO TABS
5.0000 mg | ORAL_TABLET | ORAL | Status: DC | PRN
  Administered 2022-12-07 – 2022-12-13 (×6): 5 mg via ORAL
  Filled 2022-12-07 (×6): qty 1

## 2022-12-07 NOTE — Progress Notes (Signed)
Palliative Medicine Progress Note   Patient Name: Jessica [name]       Date: 12/07/2022 DOB: 10/22/1950  Age: 72 y.o. MRN#: [id number] Attending Physician: Leatha Gilding, MD Primary Care Physician: Emilio Aspen, MD Admit Date: 12/05/2022    HPI/Patient Profile: 72 y.o. female  with past medical history of multiple myeloma currently not a candidate for therapy due to progressive weakness and decreased performance status, history of steroid-induced myopathy, anemia of chronic disease, and neuropathy who presented to [facility] ED on 12/05/2022 with generalized weakness, poor oral intake, and failure to thrive. Imaging in the ED showed bilateral PE.   Palliative Medicine was consulted for goals of care   Subjective: I met today at bedside with patient and her husband [place]. Patient reports her pain is somewhat improved from yesterday. During my visit, she is mostly lucid but does have several brief episodes of confusion.   We reviewed that that patient has multiple myeloma and is not a candidate for additional treatment due to her progressive weakness. We discussed that her disease is progressing and that her weakness is unfortunately not going to improve.   Provided education and counseling at on the philosophy and benefits of hospice care. Discussed that it offers a holistic approach to care in the setting of advanced illness, and is about supporting the patient where they are while allowing the natural course to occur. Hospice can help a patient feel as good as possible for as long as possible. Discussed the hospice team can provide personal care, management of pain and other symptoms, and help keep patient out of the hospital.  We also discussed code status.  Encouraged consideration of  DNR/DNI with the understanding that prognosis would be poor in the event of cardiac arrest.  I explained that DNR is a protective measure to prevent harm from a person in the last moments of life.   After further discussion, patient and her husband agree that DNR/DNI status is appropriate and consistent with her wishes. They also agree to hospice support at home, but want her to be medically optimized prior to discharge. I provided them with a list of hospice agencies and encouraged them to reach out to [facility] when they have reached a decision.    Objective:  Physical Exam Vitals reviewed.  Constitutional:  General: She is not in acute distress.    Appearance: She is ill-appearing.     Comments: Frail  Pulmonary:     Effort: Pulmonary effort is normal.  Neurological:     Mental Status: She is alert and oriented to person, place, and time.     Motor: Weakness present.             Vital Signs: BP 137/81 (BP Location: Left Arm)   Pulse 61   Temp 98.4 F (36.9 C) (Oral)   Resp 14   Ht 5' (1.524 m)   Wt 59.9 kg   LMP 08/10/2003   SpO2 100%   BMI 25.78 kg/m  SpO2: SpO2: 100 % O2 Device: O2 Device: Room Air     Palliative Medicine Assessment & Plan   Assessment: Principal Problem:   Pulmonary embolism (HCC) Active Problems:   Multiple myeloma (HCC)   Hypokalemia   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   Chronic GERD   Generalized weakness   Steroid myopathy   Failure to thrive in adult   Hypoglycemia   Hyperammonemia (HCC)   Hypoalbuminemia due to protein-calorie malnutrition (HCC)   Abrasion of left leg   Myopathy    Recommendations/Plan: Code status changed to DNR/DNI Patient/husband are interested in home hospice - they will reach out to TOC/CSW once they have decided on an agency Medical optimization prior to discharge home with hospice Oxycodone IR 5 mg every 4 hours as needed for pain  Use fentanyl IV prn only for severe pain   Prognosis:  < 6  months  Discharge Planning: Home with Hospice  Care plan was discussed with Dr. Elvera Lennox  Thank you for allowing the Palliative Medicine Team to assist in the care of this patient.   Greater than 50%  of this time was spent counseling and coordinating care related to the above assessment and plan.  Total time: 70 minutes   Merry Proud, NP Palliative Medicine   Please contact Palliative Medicine Team phone at [phone] for questions and concerns.  For individual provider, see AMION.

## 2022-12-07 NOTE — Progress Notes (Signed)
PROGRESS NOTE  Jessica [name]:[id number] DOB: [birth year] DOA: 12/05/2022 PCP: Emilio Aspen, MD   LOS: 1 day   Brief Narrative / Interim history: 72 year old female with history of multiple myeloma, currently not a candidate for therapy due to progressive weakness and decreased performance level, in fact with oncology recommending palliative/hospice, history of steroid-induced myopathy, anemia of chronic disease, neuropathy comes into the hospital for failure to thrive, generalized weakness, poor appetite and poor oral intake progressive over the last several weeks but more so in the last 4 days.  She has had recurrent falls, also a lot of postprandial nausea and vomiting.  She has not been able to eat in the last 2 days.  Imaging in the ED showed bilateral PEs without heart strain or pulmonary infarction, and CT scan of the abdomen and pelvis was without acute findings.  Subjective / 24h Interval events: Received pain medication yesterday, slept very well.  Feels a little bit stronger today and like she has more energy.  Assesement and Plan: Principal Problem:   Pulmonary embolism (HCC) Active Problems:   Multiple myeloma (HCC)   Hypokalemia   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   Chronic GERD   Generalized weakness   Steroid myopathy   Failure to thrive in adult   Hypoglycemia   Hyperammonemia (HCC)   Hypoalbuminemia due to protein-calorie malnutrition (HCC)   Abrasion of left leg   Myopathy   Principal problem Acute pulmonary embolism -CT angiogram of chest with contrast was suggestive of bilateral lower lobe and right middle lobe segmental and subsegmental pulmonary emboli.  Started on heparin drip, continue for now.  No clinical bleeding, however hemoglobin dipped from 10-11 range to 7.8 this morning.  Closely watch for melena or other signs of bleeding  Active problems Generalized weakness, FTT -after reviewing outpatient notes this seems to be a problem that is somewhat  chronic rather than acute.  Last saw Dr. Bertis Ruddy in January 2024, due to decline in her performance was not a candidate for further treatment.  It is less likely to improve and she recommended consideration for palliative care and hospice.  Palliative care consulted, appreciate input  Hypoglycemia - Blood glucose dropped to 56 on admission.  Initially on D5, now discontinued and encourage oral intake  Hypokalemia, hypomagnesemia -potassium normalized today  Hyperammonemia - Ammonia on admission borderline @ 36, patient with hepatic steatosis. Continue Lactulose  Left leg abrasion wound - Wound care nurse consulted   Severe protein calorie malnutrition - RD consult, started on Marinol as patient reports that this has helped her appetite in the past  Multiple myeloma - Patient follows with oncology (Dr. Bertis Ruddy)  with last visit being in January of this year   Steroid myopathy - Continue Decadron.  PT eval pending   Anemia of chronic illness - Hemoglobin is stable but noted drop this morning, continue to keep on heparin.  May be dilutional  Goals of care - Palliative care will be consulted  Scheduled Meds:  dexamethasone (DECADRON) injection  2 mg Intravenous QODAY   dexamethasone (DECADRON) injection  4 mg Intravenous QODAY   dronabinol  5 mg Oral BID AC   feeding supplement  237 mL Oral BID BM   multivitamin with minerals  1 tablet Oral Daily   pantoprazole (PROTONIX) IV  40 mg Intravenous Q24H   Continuous Infusions:  sodium chloride Stopped (12/05/22 1852)   heparin 800 Units/hr (12/06/22 2354)   PRN Meds:.sodium chloride, acetaminophen **OR** acetaminophen, dextrose, dextrose, fentaNYL (  SUBLIMAZE) injection, ondansetron **OR** ondansetron (ZOFRAN) IV, prochlorperazine  Current Outpatient Medications  Medication Instructions   acetaminophen (TYLENOL) 500-1,000 mg, Oral, Every 6 hours PRN   acyclovir (ZOVIRAX) 400 mg, Oral, Daily   cyanocobalamin (VITAMIN B12) 500 mcg, Oral,  Daily   dexamethasone (DECADRON) 2-4 mg, Oral, See admin instructions, Take 1 tablet alternate with 1/2 tablet every other day   lidocaine-prilocaine (EMLA) cream Apply to affected area once   nystatin (MYCOSTATIN/NYSTOP) powder 1 Application, Topical, 2 times daily   omeprazole (PRILOSEC) 20 MG capsule TAKE 1 CAPSULE(20 MG) BY MOUTH DAILY   ondansetron (ZOFRAN) 8 MG tablet TAKE 1 TABLET(8 MG) BY MOUTH TWICE DAILY AS NEEDED FOR NAUSEA OR VOMITING   prochlorperazine (COMPAZINE) 10 mg, Oral, Every 6 hours PRN    Diet Orders (From admission, onward)     Start     Ordered   12/06/22 1603  Diet regular Room service appropriate? Yes; Fluid consistency: Thin  Diet effective now       Question Answer Comment  Room service appropriate? Yes   Fluid consistency: Thin      12/06/22 1602            DVT prophylaxis: SCDs Start: 12/05/22 1816   Lab Results  Component Value Date   PLT 201 12/07/2022      Code Status: Full Code  Family Communication: Updated husband at bedside  Status is: Inpatient  Level of care: Progressive  Consultants:  Palliative   Objective: Vitals:   12/06/22 0901 12/06/22 1240 12/06/22 1942 12/07/22 0507  BP: (!) 109/59 (!) 150/60 132/74 134/87  Pulse: (!) 102 (!) 106 97 89  Resp: (!) 24 18    Temp: 98.8 F (37.1 C) 98.1 F (36.7 C) 98.1 F (36.7 C) 98.1 F (36.7 C)  TempSrc: Oral  Oral Oral  SpO2: (!) 89% 100% 100% 100%  Weight:      Height:        Intake/Output Summary (Last 24 hours) at 12/07/2022 0959 Last data filed at 12/07/2022 0700 Gross per 24 hour  Intake 1405.05 ml  Output 250 ml  Net 1155.05 ml    Wt Readings from Last 3 Encounters:  12/05/22 59.9 kg  08/24/22 60.1 kg  08/12/22 63.1 kg    Examination:  Constitutional: NAD Eyes: lids and conjunctivae normal, no scleral icterus ENMT: mmm Neck: normal, supple Respiratory: clear to auscultation bilaterally, no wheezing, no crackles.  Cardiovascular: Regular rate and  rhythm, no murmurs / rubs / gallops.  Abdomen: soft, no distention, no tenderness. Bowel sounds positive.   Data Reviewed: I have independently reviewed following labs and imaging studies   CBC Recent Labs  Lab 12/05/22 1016 12/05/22 1041 12/06/22 0422 12/07/22 0846  WBC 8.4  --  6.6 5.3  HGB 10.6* 11.9* 8.0* 7.8*  HCT 33.3* 35.0* 25.9* 24.3*  PLT 260  --  219 201  MCV 87.9  --  90.2 88.4  MCH 28.0  --  27.9 28.4  MCHC 31.8  --  30.9 32.1  RDW 22.3*  --  22.6* 22.5*  LYMPHSABS 5.0*  --   --   --   MONOABS 0.7  --   --   --   EOSABS 0.0  --   --   --   BASOSABS 0.1  --   --   --      Recent Labs  Lab 12/05/22 1016 12/05/22 1017 12/05/22 1034 12/05/22 1041 12/05/22 2209 12/06/22 0422 12/07/22 0846  NA 131*  --   --  135  --  131* 132*  K 2.6*  --   --  2.5*  --  3.2* 4.0  CL 96*  --   --  96*  --  102 104  CO2 19*  --   --   --   --  19* 20*  GLUCOSE 77  --   --  89  --  75 102*  BUN <5*  --   --  <3*  --  <5* <5*  CREATININE 0.75  --   --  0.60  --  0.65 0.70  CALCIUM 8.6*  --   --   --   --  7.9* 7.9*  AST 28  --   --   --   --  21 19  ALT 9  --   --   --   --  7 7  ALKPHOS 62  --   --   --   --  47 51  BILITOT 2.2*  --   --   --   --  1.6* 1.6*  ALBUMIN 2.8*  --   --   --   --  2.1* 2.2*  MG 1.8  --   --   --   --  1.5*  --   LATICACIDVEN  --  1.5  --   --  1.4  --   --   INR 1.0  --   --   --   --   --   --   TSH  --   --  0.645  --   --   --   --   AMMONIA  --  36*  --   --   --   --   --      ------------------------------------------------------------------------------------------------------------------ No results for input(s): "CHOL", "HDL", "LDLCALC", "TRIG", "CHOLHDL", "LDLDIRECT" in the last 72 hours.  Lab Results  Component Value Date   HGBA1C 4.9 08/13/2022   ------------------------------------------------------------------------------------------------------------------ Recent Labs    12/05/22 1034  TSH 0.645     Cardiac  Enzymes No results for input(s): "CKMB", "TROPONINI", "MYOGLOBIN" in the last 168 hours.  Invalid input(s): "CK" ------------------------------------------------------------------------------------------------------------------ No results found for: "BNP"  CBG: Recent Labs  Lab 12/06/22 1147 12/06/22 1602 12/06/22 2014 12/07/22 0013 12/07/22 0502  GLUCAP 82 133* 124* 114* 91     No results found for this or any previous visit (from the past 240 hour(s)).   Radiology Studies: No results found.   Pamella Pert, MD, PhD [facility]  Between 7 am - 7 pm I am available, please contact me via Amion (for emergencies) or Securechat (non urgent messages)  Between 7 pm - 7 am I am not available, please contact night coverage MD/APP via Amion

## 2022-12-07 NOTE — Progress Notes (Addendum)
ANTICOAGULATION CONSULT NOTE  Pharmacy Consult for heparin Indication: pulmonary embolus  Allergies  Allergen Reactions   Augmentin [Amoxicillin-Pot Clavulanate] Hives, Itching, Nausea And Vomiting and Rash    ++Tolerates Cefepime++ Has patient had a PCN reaction causing immediate rash, facial/tongue/throat swelling, SOB or lightheadedness with hypotension: Yes Has patient had a PCN reaction causing severe rash involving mucus membranes or skin necrosis: Yes Has patient had a PCN reaction that required hospitalization No Has patient had a PCN reaction occurring within the last 10 years: Yes If all of the above answers are "NO", then may proceed with Cephalosporin use. *okay to take other types of cillin   Albuterol Other (See Comments)    Elevated HR and BP   Codeine Nausea Only   Ibuprofen Nausea Only   Nsaids Other (See Comments)    Gi Upset   Penicillin G     Other Reaction(s): severe rash involving mucus membrane   Tolmetin Other (See Comments)    Gi Upset   Tetanus-Diphth-Acell Pertussis Rash    Received injection in ER on 8/1, developed fine rash all over body. Cellulitis to injection site on left deltoid.    Patient Measurements: Height: 5' (152.4 cm) Weight: 59.9 kg (132 lb) IBW/kg (Calculated) : 45.5 Heparin Dosing Weight: 60 kg  Vital Signs: Temp: 98.1 F (36.7 C) (04/30 0507) Temp Source: Oral (04/30 0507) BP: 134/87 (04/30 0507) Pulse Rate: 89 (04/30 0507)  Labs: Recent Labs    12/05/22 1016 12/05/22 1041 12/05/22 1041 12/05/22 2209 12/06/22 0422 12/06/22 1420 12/06/22 2155 12/07/22 0846  HGB 10.6* 11.9*  --   --  8.0*  --   --  7.8*  HCT 33.3* 35.0*  --   --  25.9*  --   --  24.3*  PLT 260  --   --   --  219  --   --  201  LABPROT 13.3  --   --   --   --   --   --   --   INR 1.0  --   --   --   --   --   --   --   HEPARINUNFRC  --   --    < >  --  0.73* 0.60 0.74* 0.69  CREATININE 0.75 0.60  --   --  0.65  --   --  0.70  TROPONINIHS 9  --   --   20*  --   --   --   --    < > = values in this interval not displayed.     Estimated Creatinine Clearance: 50.7 mL/min (by C-G formula based on SCr of 0.7 mg/dL).  Assessment: 72 yo F presents with failure to thrive / weakness. CT results show bilateral PEs. No R heart strain. Hgb 11.9, plts wnl. No s/s of bleed. No anticoag PTA.  12/07/2022 HL level is 0.86 on heparin 800 units/hr, supratherapeutic  No complications of therapy noted  Goal of Therapy:  Heparin level 0.3-0.7 units/ml Monitor platelets by anticoagulation protocol: Yes   Plan:  Decrease heparin drip to 700 units/hr Check heparin level 8 hr after rate decrease Monitor CBC, s/s of bleed F/u plans for long-term anticoagulation   Terrilee Files, PharmD 12/07/2022 10:38 AM

## 2022-12-07 NOTE — TOC Initial Note (Signed)
Transition of Care [facility]) - Initial/Assessment Note    Patient Details  Name: Jessica [name] MRN: [id number] Date of Birth: 1951/03/05  Transition of Care [facility]) CM/SW Contact:    Larrie Kass, LCSW Phone Number: 12/07/2022, 2:34 PM  Clinical Narrative:                  Per chart review palliative consulted , TOC to follow for recommendations.            Patient Goals and CMS Choice            Expected Discharge Plan and Services                                              Prior Living Arrangements/Services                       Activities of Daily Living Home Assistive Devices/Equipment: None ADL Screening (condition at time of admission) Patient's cognitive ability adequate to safely complete daily activities?: No Is the patient deaf or have difficulty hearing?: No Does the patient have difficulty seeing, even when wearing glasses/contacts?: No Does the patient have difficulty concentrating, remembering, or making decisions?: Yes Patient able to express need for assistance with ADLs?: Yes Does the patient have difficulty dressing or bathing?: No Independently performs ADLs?: No Communication: Independent Dressing (OT): Needs assistance Is this a change from baseline?: Pre-admission baseline Grooming: Needs assistance Is this a change from baseline?: Pre-admission baseline Feeding: Independent Bathing: Needs assistance Is this a change from baseline?: Pre-admission baseline Toileting: Needs assistance Is this a change from baseline?: Pre-admission baseline In/Out Bed: Needs assistance Is this a change from baseline?: Pre-admission baseline Walks in Home: Needs assistance Is this a change from baseline?: Pre-admission baseline Does the patient have difficulty walking or climbing stairs?: No Weakness of Legs: Both Weakness of Arms/Hands: Both  Permission Sought/Granted                  Emotional Assessment               Admission diagnosis:  Hypokalemia [E87.6] Pulmonary embolism (HCC) [I26.99] Generalized weakness [R53.1] Multiple subsegmental pulmonary emboli without acute cor pulmonale (HCC) [I26.94] Nausea and vomiting, unspecified vomiting type [R11.2] Myopathy [G72.9] Patient Active Problem List   Diagnosis Date Noted   Myopathy 12/06/2022   Pulmonary embolism (HCC) 12/05/2022   Hypoglycemia 12/05/2022   Hyperammonemia (HCC) 12/05/2022   Hypoalbuminemia due to protein-calorie malnutrition (HCC) 12/05/2022   Abrasion of left leg 12/05/2022   Failure to thrive in adult 08/12/2022   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   Yeast infection of the skin 05/18/2022   Drug-induced neutropenia (HCC) 06/23/2021   Acute back pain 05/25/2021   Recurrent falls 05/25/2021   Drug-induced skin rash 03/11/2021   Fall against sharp object, initial encounter 03/09/2021   Deficiency anemia 02/24/2021   Vitamin B12 deficiency 01/27/2021   Nausea with vomiting 12/31/2020   Generalized weakness 10/06/2020   Irritation of right eye 09/08/2020   Immunocompromised state due to drug therapy (HCC) 05/19/2020   Preventive measure 04/21/2020   Alopecia 08/21/2019   Hypomagnesemia 04/25/2019   Right sided temporal headache 04/13/2019   Goals of care, counseling/discussion 04/13/2019   Hypocalcemia 01/11/2019   Osteopenia 06/05/2018   Weight loss 04/14/2018   Elevated blood pressure reading in office without diagnosis of  hypertension 01/12/2018   Diarrhea 10/13/2017   Postural dizziness 10/13/2017   Right ovarian cyst 05/13/2017   Chronic GERD 01/13/2017   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 08/10/2016   Hypokalemia 07/16/2016   Pneumonia 07/13/2016   Atypical chest pain 07/13/2016   Chest pain on breathing 07/12/2016   Mucositis due to antineoplastic therapy 07/09/2016   Candidal esophagitis (HCC) 04/30/2016   Herpes simplex 04/30/2016   Chronic kidney disease, stage III (moderate) (HCC) 03/12/2016   Hypokalemia,  gastrointestinal losses 01/16/2016   Pancytopenia, acquired (HCC) 10/20/2015   Nasal congestion 10/20/2015   Varicose vein of leg 08/25/2015   S/P bone marrow transplant (HCC) 04/22/2015   Drug-induced hypotension 04/22/2015   Petechiae 02/14/2015   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   Essential hypertension 02/07/2015   Infection of eyelid 01/16/2015   Protein calorie malnutrition (HCC) 01/16/2015   Superficial thrombophlebitis of upper extremity 12/24/2014   Sty, external 12/24/2014   Other constipation 12/03/2014   Dehydration 12/02/2014   Weakness 12/02/2014   Multiple myeloma (HCC) 11/19/2014   Prerenal renal failure 11/19/2014   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 [place] [address] Phone: [phone] Fax: [phone]     Social Determinants of Health (SDOH) Social History: SDOH Screenings   Food Insecurity: No Food Insecurity (12/05/2022)  Housing: Low Risk  (12/05/2022)  Transportation Needs: No Transportation Needs (12/05/2022)  Utilities: Not At Risk (12/05/2022)  Tobacco Use: Low Risk  (12/05/2022)   SDOH Interventions:     Readmission Risk Interventions     No data to display

## 2022-12-07 NOTE — Plan of Care (Signed)

## 2022-12-08 DIAGNOSIS — I2692 Saddle embolus of pulmonary artery without acute cor pulmonale: Secondary | ICD-10-CM

## 2022-12-08 LAB — BASIC METABOLIC PANEL
Anion gap: 7 (ref 5–15)
Anion gap: 6 (ref 5–15)
BUN: <5 mg/dL — ABNORMAL LOW (ref 8–23)
BUN: 5 mg/dL — ABNORMAL LOW (ref 8–23)
CO2: 20 mmol/L — ABNORMAL LOW (ref 22–32)
CO2: 23 mmol/L (ref 22–32)
Calcium: 7.5 mg/dL — ABNORMAL LOW (ref 8.9–10.3)
Calcium: 7.9 mg/dL — ABNORMAL LOW (ref 8.9–10.3)
Chloride: 98 mmol/L (ref 98–111)
Chloride: 102 mmol/L (ref 98–111)
Creatinine, Ser: 0.67 mg/dL (ref 0.44–1.00)
Creatinine, Ser: 0.65 mg/dL (ref 0.44–1.00)
GFR, Estimated: >60 mL/min (ref >60)
GFR, Estimated: >60 mL/min (ref >60)
Glucose, Bld: 116 mg/dL — ABNORMAL HIGH (ref 70–99)
Glucose, Bld: 106 mg/dL — ABNORMAL HIGH (ref 70–99)
Potassium: 3.5 mmol/L (ref 3.5–5.1)
Potassium: 3.6 mmol/L (ref 3.5–5.1)
Sodium: 125 mmol/L — ABNORMAL LOW (ref 135–145)
Sodium: 131 mmol/L — ABNORMAL LOW (ref 135–145)

## 2022-12-08 LAB — CBC
HCT: 22.9 % — ABNORMAL LOW (ref 36.0–46.0)
Hemoglobin: 7.4 g/dL — ABNORMAL LOW (ref 12.0–15.0)
MCH: 28.1 pg (ref 26.0–34.0)
MCHC: 32.3 g/dL (ref 30.0–36.0)
MCV: 87.1 fL (ref 80.0–100.0)
Platelets: 233 10*3/uL (ref 150–400)
RBC: 2.63 MIL/uL — ABNORMAL LOW (ref 3.87–5.11)
RDW: 22.3 % — ABNORMAL HIGH (ref 11.5–15.5)
WBC: 4.2 10*3/uL (ref 4.0–10.5)
nRBC: 0.7 % — ABNORMAL HIGH (ref 0.0–0.2)

## 2022-12-08 LAB — HEPARIN LEVEL (UNFRACTIONATED)
Heparin Unfractionated: 0.45 IU/mL (ref 0.30–0.70)
Heparin Unfractionated: 0.24 IU/mL — ABNORMAL LOW (ref 0.30–0.70)
Heparin Unfractionated: 0.29 IU/mL — ABNORMAL LOW (ref 0.30–0.70)

## 2022-12-08 LAB — MAGNESIUM: Magnesium: 1.8 mg/dL (ref 1.7–2.4)

## 2022-12-08 MED ORDER — VITAMIN B-12 100 MCG PO TABS
500.0000 ug | ORAL_TABLET | Freq: Every day | ORAL | Status: DC
  Administered 2022-12-09 – 2022-12-11 (×3): 500 ug via ORAL
  Filled 2022-12-08 (×6): qty 5

## 2022-12-08 NOTE — Progress Notes (Addendum)
PROGRESS NOTE    Jessica [name]  ZOX:[id number] DOB: April 21, 1951 DOA: 12/05/2022 PCP: Emilio Aspen, MD  Chief Complaint  Patient presents with   Failure To Thrive   Weakness    Brief Narrative:   72 year old female with history of multiple myeloma, currently not Mckinna Demars candidate for therapy due to progressive weakness and decreased performance level, in fact with oncology recommending palliative/hospice, history of steroid-induced myopathy, anemia of chronic disease, neuropathy comes into the hospital for failure to thrive, generalized weakness, poor appetite and poor oral intake progressive over the last several weeks but more so in the last 4 days. She has had recurrent falls, also Bryant Lipps lot of postprandial nausea and vomiting. She has not been able to eat in the last 2 days. Imaging in the ED showed bilateral PEs without heart strain or pulmonary infarction, and CT scan of the abdomen and pelvis was without acute findings.   Assessment & Plan:   Principal Problem:   Pulmonary embolism (HCC) Active Problems:   Multiple myeloma (HCC)   Hypokalemia   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   Chronic GERD   Generalized weakness   Steroid myopathy   Failure to thrive in adult   Hypoglycemia   Hyperammonemia (HCC)   Hypoalbuminemia due to protein-calorie malnutrition (HCC)   Abrasion of left leg   Myopathy    Goals of care - appreciate palliative assistance, sounds like working on home with hospice  Acute pulmonary embolism -CT angiogram of chest with contrast was suggestive of bilateral lower lobe and right middle lobe segmental and subsegmental pulmonary emboli.  Started on heparin drip, continue for now.  No clinical bleeding, however hemoglobin drifting down. Continue to monitor on heparin, possible transition to PO eliquis 5/2?    Generalized weakness, FTT -after reviewing outpatient notes this seems to be Vane Yapp problem that is somewhat chronic rather than acute.  Last saw Dr. Bertis Ruddy in  January 2024, due to decline in her performance was not Jettie Lazare candidate for further treatment.  It is less likely to improve and she recommended consideration for palliative care and hospice.  Palliative care consulted, appreciate input   Slurred Speech  Encephalopathy X1 week or so? Will follow head CT  Hypoglycemia - Blood glucose dropped to 56 on admission.  Initially on D5, now discontinued and encourage oral intake   Hypokalemia, hypomagnesemia -potassium normalized today   Hyperammonemia - Ammonia on admission borderline @ 36, patient with hepatic steatosis. Continue Lactulose   Left leg abrasion wound - Wound care nurse consulted   Severe protein calorie malnutrition - RD consult, started on Marinol as patient reports that this has helped her appetite in the past   Multiple myeloma - Patient follows with oncology (Dr. Bertis Ruddy)  with last visit being in January of this year   Steroid myopathy - Continue Decadron.  PT eval pending   Anemia of chronic illness - relatively stable, overall downtrend, follow on heparin     DVT prophylaxis: heparin gtt Code Status: dnr/dni Family Communication: sister in law Disposition:   Status is: Inpatient Remains inpatient appropriate because: continued need for inpatient care   Consultants:  Palliative care  Procedures:  none  Antimicrobials:  Anti-infectives (From admission, onward)    None       Subjective: Sleepy, but no pain Sister in law at bedside  Objective: Vitals:   12/07/22 1236 12/07/22 2208 12/08/22 0605 12/08/22 1312  BP: 137/81 (!) 145/85 (!) 144/76 139/74  Pulse: 61 98 92  Resp: 14 17  17   Temp: 98.4 F (36.9 C) 98.3 F (36.8 C) 97.7 F (36.5 C) 98.3 F (36.8 C)  TempSrc: Oral Oral Oral Oral  SpO2: 100% 96% 99% 99%  Weight:      Height:        Intake/Output Summary (Last 24 hours) at 12/08/2022 1749 Last data filed at 12/08/2022 1723 Gross per 24 hour  Intake 940 ml  Output 1250 ml  Net -310 ml    Filed Weights   12/05/22 1800  Weight: 59.9 kg    Examination:  General exam: Appears calm and comfortable  Respiratory system: unlabored Cardiovascular system: RRR Gastrointestinal system: Abdomen is nondistended, soft and nontender.  Central nervous system: sleepy, intermittently falling asleep Extremities: no LEE  Data Reviewed: I have personally reviewed following labs and imaging studies  CBC: Recent Labs  Lab 12/05/22 1016 12/05/22 1041 12/06/22 0422 12/07/22 0846 12/08/22 0330  WBC 8.4  --  6.6 5.3 4.2  NEUTROABS 2.3  --   --   --   --   HGB 10.6* 11.9* 8.0* 7.8* 7.4*  HCT 33.3* 35.0* 25.9* 24.3* 22.9*  MCV 87.9  --  90.2 88.4 87.1  PLT 260  --  219 201 233    Basic Metabolic Panel: Recent Labs  Lab 12/05/22 1016 12/05/22 1041 12/06/22 0422 12/07/22 0846 12/08/22 0330 12/08/22 1112  NA 131* 135 131* 132* 125* 131*  K 2.6* 2.5* 3.2* 4.0 3.5 3.6  CL 96* 96* 102 104 98 102  CO2 19*  --  19* 20* 20* 23  GLUCOSE 77 89 75 102* 116* 106*  BUN <5* <3* <5* <5* <5* 5*  CREATININE 0.75 0.60 0.65 0.70 0.67 0.65  CALCIUM 8.6*  --  7.9* 7.9* 7.5* 7.9*  MG 1.8  --  1.5*  --  1.8  --   PHOS  --   --  1.4*  --   --   --     GFR: Estimated Creatinine Clearance: 50.7 mL/min (by C-G formula based on SCr of 0.65 mg/dL).  Liver Function Tests: Recent Labs  Lab 12/05/22 1016 12/06/22 0422 12/07/22 0846  AST 28 21 19   ALT 9 7 7   ALKPHOS 62 47 51  BILITOT 2.2* 1.6* 1.6*  PROT 8.2* 6.0* 6.2*  ALBUMIN 2.8* 2.1* 2.2*    CBG: Recent Labs  Lab 12/06/22 1147 12/06/22 1602 12/06/22 2014 12/07/22 0013 12/07/22 0502  GLUCAP 82 133* 124* 114* 91     No results found for this or any previous visit (from the past 240 hour(s)).       Radiology Studies: No results found.      Scheduled Meds:  dexamethasone (DECADRON) injection  2 mg Intravenous QODAY   dexamethasone (DECADRON) injection  4 mg Intravenous QODAY   dronabinol  5 mg Oral BID AC    feeding supplement  237 mL Oral BID BM   multivitamin with minerals  1 tablet Oral Daily   pantoprazole (PROTONIX) IV  40 mg Intravenous Q24H   Continuous Infusions:  sodium chloride Stopped (12/05/22 1852)   heparin 750 Units/hr (12/08/22 1356)     LOS: 2 days    Time spent: over 30 min    Lacretia Nicks, MD [facility]   To contact the attending provider between 7A-7P or the covering provider during after hours 7P-7A, please log into the web site [email or web address] and access using universal [place] password for that web site. If you do not have the password,  please call the hospital operator.  12/08/2022, 5:49 PM

## 2022-12-08 NOTE — Progress Notes (Signed)
ANTICOAGULATION CONSULT NOTE - Follow Up Consult  Pharmacy Consult for heparin  Indication: acute pulmonary embolus  Allergies  Allergen Reactions   Augmentin [Amoxicillin-Pot Clavulanate] Hives, Itching, Nausea And Vomiting and Rash    ++Tolerates Cefepime++ Has patient had a PCN reaction causing immediate rash, facial/tongue/throat swelling, SOB or lightheadedness with hypotension: Yes Has patient had a PCN reaction causing severe rash involving mucus membranes or skin necrosis: Yes Has patient had a PCN reaction that required hospitalization No Has patient had a PCN reaction occurring within the last 10 years: Yes If all of the above answers are "NO", then may proceed with Cephalosporin use. *okay to take other types of cillin   Albuterol Other (See Comments)    Elevated HR and BP   Codeine Nausea Only   Ibuprofen Nausea Only   Nsaids Other (See Comments)    Gi Upset   Penicillin G     Other Reaction(s): severe rash involving mucus membrane   Tolmetin Other (See Comments)    Gi Upset   Tetanus-Diphth-Acell Pertussis Rash    Received injection in ER on 8/1, developed fine rash all over body. Cellulitis to injection site on left deltoid.    Patient Measurements: Height: 5' (152.4 cm) Weight: 59.9 kg (132 lb) IBW/kg (Calculated) : 45.5 Heparin Dosing Weight: 60 kg  Vital Signs: Temp: 97.6 F (36.4 C) (05/01 2018) Temp Source: Oral (05/01 2018) BP: 135/69 (05/01 2018) Pulse Rate: 85 (05/01 2018)  Labs: Recent Labs    12/06/22 0422 12/06/22 1420 12/07/22 0846 12/07/22 1728 12/08/22 0330 12/08/22 1112 12/08/22 2224  HGB 8.0*  --  7.8*  --  7.4*  --   --   HCT 25.9*  --  24.3*  --  22.9*  --   --   PLT 219  --  201  --  233  --   --   HEPARINUNFRC 0.73*   < > 0.69   < > 0.45 0.24* 0.29*  CREATININE 0.65  --  0.70  --  0.67 0.65  --    < > = values in this interval not displayed.     Estimated Creatinine Clearance: 50.7 mL/min (by C-G formula based on SCr of  0.65 mg/dL).  Assessment: Patient's a 72 y.o F with hx multiple myeloma who presented to the ED on 12/05/22 with generalized weakness and failure to thrive. Chest CT on 12/05/22 showed bilateral PE with no evidence of RHS.  She's currently on heparin drip for VTE treatment.   Today, 12/08/2022: - heparin level is sub-therapeutic at 0.24 - hgb 7.4, plts 233K - no bleeding documented   2224 HL 0.29 subtherapeutic on 750 units/hr  Goal of Therapy:  Heparin level 0.3-0.7 units/ml Monitor platelets by anticoagulation protocol: Yes   Plan:  - increase heparin drip to 800 units/hr - check 8 hr heparin level - monitor for s/sx bleeding   Arley Phenix RPh 12/08/2022, 11:16 PM

## 2022-12-08 NOTE — Progress Notes (Signed)
ANTICOAGULATION CONSULT NOTE - Follow Up Consult  Pharmacy Consult for heparin  Indication: acute pulmonary embolus  Allergies  Allergen Reactions   Augmentin [Amoxicillin-Pot Clavulanate] Hives, Itching, Nausea And Vomiting and Rash    ++Tolerates Cefepime++ Has patient had a PCN reaction causing immediate rash, facial/tongue/throat swelling, SOB or lightheadedness with hypotension: Yes Has patient had a PCN reaction causing severe rash involving mucus membranes or skin necrosis: Yes Has patient had a PCN reaction that required hospitalization No Has patient had a PCN reaction occurring within the last 10 years: Yes If all of the above answers are "NO", then may proceed with Cephalosporin use. *okay to take other types of cillin   Albuterol Other (See Comments)    Elevated HR and BP   Codeine Nausea Only   Ibuprofen Nausea Only   Nsaids Other (See Comments)    Gi Upset   Penicillin G     Other Reaction(s): severe rash involving mucus membrane   Tolmetin Other (See Comments)    Gi Upset   Tetanus-Diphth-Acell Pertussis Rash    Received injection in ER on 8/1, developed fine rash all over body. Cellulitis to injection site on left deltoid.    Patient Measurements: Height: 5' (152.4 cm) Weight: 59.9 kg (132 lb) IBW/kg (Calculated) : 45.5 Heparin Dosing Weight: 60 kg  Vital Signs: Temp: 97.7 F (36.5 C) (05/01 0605) Temp Source: Oral (05/01 0605) BP: 144/76 (05/01 0605) Pulse Rate: 92 (05/01 0605)  Labs: Recent Labs     0000 12/05/22 2209 12/06/22 0422 12/06/22 1420 12/07/22 0846 12/07/22 1728 12/08/22 0330 12/08/22 1112  HGB   < >  --  8.0*  --  7.8*  --  7.4*  --   HCT  --   --  25.9*  --  24.3*  --  22.9*  --   PLT  --   --  219  --  201  --  233  --   HEPARINUNFRC  --   --  0.73*   < > 0.69 0.86* 0.45 0.24*  CREATININE   < >  --  0.65  --  0.70  --  0.67 0.65  TROPONINIHS  --  20*  --   --   --   --   --   --    < > = values in this interval not displayed.     Estimated Creatinine Clearance: 50.7 mL/min (by C-G formula based on SCr of 0.65 mg/dL).  Assessment: Patient's a 72 y.o F with hx multiple myeloma who presented to the ED on 12/05/22 with generalized weakness and failure to thrive. Chest CT on 12/05/22 showed bilateral PE with no evidence of RHS.  She's currently on heparin drip for VTE treatment.   Today, 12/08/2022: - heparin level is sub-therapeutic at 0.24 - hgb 7.4, plts 233K - no bleeding documented   Goal of Therapy:  Heparin level 0.3-0.7 units/ml Monitor platelets by anticoagulation protocol: Yes   Plan:  - increase heparin drip to 750 units/hr - check 8 hr heparin level - monitor for s/sx bleeding   Lucia Gaskins 12/08/2022,12:42 PM

## 2022-12-08 NOTE — Plan of Care (Signed)
  Problem: Skin Integrity: Goal: Risk for impaired skin integrity will decrease Outcome: Progressing   Problem: Education: Goal: Knowledge of General Education information will improve Description: Including pain rating scale, medication(s)/side effects and non-pharmacologic comfort measures Outcome: Not Progressing   Problem: Health Behavior/Discharge Planning: Goal: Ability to manage health-related needs will improve Outcome: Not Progressing   Problem: Clinical Measurements: Goal: Ability to maintain clinical measurements within normal limits will improve Outcome: Not Progressing   Problem: Activity: Goal: Risk for activity intolerance will decrease Outcome: Not Progressing   Problem: Nutrition: Goal: Adequate nutrition will be maintained Outcome: Not Progressing   Problem: Elimination: Goal: Will not experience complications related to bowel motility Outcome: Not Progressing

## 2022-12-08 NOTE — Progress Notes (Signed)
ANTICOAGULATION CONSULT NOTE  Pharmacy Consult for heparin Indication: pulmonary embolus  Allergies  Allergen Reactions   Augmentin [Amoxicillin-Pot Clavulanate] Hives, Itching, Nausea And Vomiting and Rash    ++Tolerates Cefepime++ Has patient had a PCN reaction causing immediate rash, facial/tongue/throat swelling, SOB or lightheadedness with hypotension: Yes Has patient had a PCN reaction causing severe rash involving mucus membranes or skin necrosis: Yes Has patient had a PCN reaction that required hospitalization No Has patient had a PCN reaction occurring within the last 10 years: Yes If all of the above answers are "NO", then may proceed with Cephalosporin use. *okay to take other types of cillin   Albuterol Other (See Comments)    Elevated HR and BP   Codeine Nausea Only   Ibuprofen Nausea Only   Nsaids Other (See Comments)    Gi Upset   Penicillin G     Other Reaction(s): severe rash involving mucus membrane   Tolmetin Other (See Comments)    Gi Upset   Tetanus-Diphth-Acell Pertussis Rash    Received injection in ER on 8/1, developed fine rash all over body. Cellulitis to injection site on left deltoid.    Patient Measurements: Height: 5' (152.4 cm) Weight: 59.9 kg (132 lb) IBW/kg (Calculated) : 45.5 Heparin Dosing Weight: 60 kg  Vital Signs: Temp: 98.3 F (36.8 C) (04/30 2208) Temp Source: Oral (04/30 2208) BP: 145/85 (04/30 2208) Pulse Rate: 98 (04/30 2208)  Labs: Recent Labs    12/05/22 1016 12/05/22 1041 12/05/22 2209 12/06/22 0422 12/06/22 1420 12/07/22 0846 12/07/22 1728 12/08/22 0330  HGB 10.6* 11.9*  --  8.0*  --  7.8*  --  7.4*  HCT 33.3* 35.0*  --  25.9*  --  24.3*  --  22.9*  PLT 260  --   --  219  --  201  --  233  LABPROT 13.3  --   --   --   --   --   --   --   INR 1.0  --   --   --   --   --   --   --   HEPARINUNFRC  --   --   --  0.73*   < > 0.69 0.86* 0.45  CREATININE 0.75 0.60  --  0.65  --  0.70  --   --   TROPONINIHS 9  --  20*   --   --   --   --   --    < > = values in this interval not displayed.     Estimated Creatinine Clearance: 50.7 mL/min (by C-G formula based on SCr of 0.7 mg/dL).  Assessment: 72 yo F presents with failure to thrive / weakness. CT results show bilateral PEs. No R heart strain. Hgb 11.9, plts wnl. No s/s of bleed. No anticoag PTA.  12/08/2022 HL 0.45 therapeutic on 700 units/hr No complications of therapy noted  Goal of Therapy:  Heparin level 0.3-0.7 units/ml Monitor platelets by anticoagulation protocol: Yes   Plan:  continue heparin drip at 700 units/hr Confirmatory level in 8 hours Monitor CBC, s/s of bleed F/u plans for long-term anticoagulation   Arley Phenix RPh 12/08/2022, 3:54 AM

## 2022-12-08 NOTE — Plan of Care (Signed)

## 2022-12-09 ENCOUNTER — Inpatient Hospital Stay (HOSPITAL_COMMUNITY): Payer: Medicare Other

## 2022-12-09 DIAGNOSIS — I2699 Other pulmonary embolism without acute cor pulmonale: Secondary | ICD-10-CM | POA: Diagnosis not present

## 2022-12-09 LAB — BASIC METABOLIC PANEL
Anion gap: 8 (ref 5–15)
BUN: 6 mg/dL — ABNORMAL LOW (ref 8–23)
CO2: 20 mmol/L — ABNORMAL LOW (ref 22–32)
Calcium: 7.6 mg/dL — ABNORMAL LOW (ref 8.9–10.3)
Chloride: 102 mmol/L (ref 98–111)
Creatinine, Ser: 0.83 mg/dL (ref 0.44–1.00)
GFR, Estimated: >60 mL/min (ref >60)
Glucose, Bld: 139 mg/dL — ABNORMAL HIGH (ref 70–99)
Potassium: 3.8 mmol/L (ref 3.5–5.1)
Sodium: 130 mmol/L — ABNORMAL LOW (ref 135–145)

## 2022-12-09 LAB — CBC
HCT: 22.9 % — ABNORMAL LOW (ref 36.0–46.0)
Hemoglobin: 7.6 g/dL — ABNORMAL LOW (ref 12.0–15.0)
MCH: 28.7 pg (ref 26.0–34.0)
MCHC: 33.2 g/dL (ref 30.0–36.0)
MCV: 86.4 fL (ref 80.0–100.0)
Platelets: 264 10*3/uL (ref 150–400)
RBC: 2.65 MIL/uL — ABNORMAL LOW (ref 3.87–5.11)
RDW: 21.3 % — ABNORMAL HIGH (ref 11.5–15.5)
WBC: 5.3 10*3/uL (ref 4.0–10.5)
nRBC: 0.6 % — ABNORMAL HIGH (ref 0.0–0.2)

## 2022-12-09 LAB — VITAMIN B12: Vitamin B-12: 1000 pg/mL — ABNORMAL HIGH (ref 180–914)

## 2022-12-09 LAB — HEPARIN LEVEL (UNFRACTIONATED): Heparin Unfractionated: 0.44 IU/mL (ref 0.30–0.70)

## 2022-12-09 LAB — AMMONIA: Ammonia: 26 umol/L (ref 9–35)

## 2022-12-09 MED ORDER — APIXABAN 5 MG PO TABS: 5.0000 mg | ORAL_TABLET | Freq: Two times a day (BID) | ORAL | Status: DC

## 2022-12-09 MED ORDER — APIXABAN 5 MG PO TABS
10.0000 mg | ORAL_TABLET | Freq: Two times a day (BID) | ORAL | Status: DC
  Administered 2022-12-09: 10 mg via ORAL
  Filled 2022-12-09: qty 2

## 2022-12-09 NOTE — Progress Notes (Signed)
PROGRESS NOTE    Jessica [name]  ZOX:[id number] DOB: March 19, 1951 DOA: 12/05/2022 PCP: Emilio Aspen, MD  Chief Complaint  Patient presents with   Failure To Thrive   Weakness    Brief Narrative:   72 year old female with history of multiple myeloma, currently not Marven Veley candidate for therapy due to progressive weakness and decreased performance level, in fact with oncology recommending palliative/hospice, history of steroid-induced myopathy, anemia of chronic disease, neuropathy comes into the hospital for failure to thrive, generalized weakness, poor appetite and poor oral intake progressive over the last several weeks but more so in the last 4 days. She has had recurrent falls, also Nima Bamburg lot of postprandial nausea and vomiting. She has not been able to eat in the last 2 days. Imaging in the ED showed bilateral PEs without heart strain or pulmonary infarction, and CT scan of the abdomen and pelvis was without acute findings.   Assessment & Plan:   Principal Problem:   Pulmonary embolism (HCC) Active Problems:   Multiple myeloma (HCC)   Hypokalemia   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   Chronic GERD   Generalized weakness   Steroid myopathy   Failure to thrive in adult   Hypoglycemia   Hyperammonemia (HCC)   Hypoalbuminemia due to protein-calorie malnutrition (HCC)   Abrasion of left leg   Myopathy    Goals of care - appreciate palliative assistance, sounds like working on home with hospice  Acute pulmonary embolism -CT angiogram of chest with contrast was suggestive of bilateral lower lobe and right middle lobe segmental and subsegmental pulmonary emboli.  Started on heparin drip, continue for now.  No clinical bleeding, however hemoglobin drifting down. Continue to monitor on heparin, transition to eliquis 5/2   Generalized weakness, FTT -after reviewing outpatient notes this seems to be Orlean Holtrop problem that is somewhat chronic rather than acute.  Last saw Dr. Bertis Ruddy in January 2024,  due to decline in her performance was not Damire Remedios candidate for further treatment.  It is less likely to improve and she recommended consideration for palliative care and hospice.  Palliative care consulted, appreciate input   Slurred Speech  Encephalopathy X1 week or so? Will follow head CT -> no acute intracranial abnormality. Will hold off on additional imaging.  Head CT after 1 week without acute abnormalities.  I think MRI unlikely to meaningfully change management.   Hypoglycemia - Blood glucose dropped to 56 on admission.  Initially on D5, now discontinued and encourage oral intake   Hypokalemia, hypomagnesemia -potassium normalized today   Hyperammonemia - Ammonia on admission borderline @ 36, patient with hepatic steatosis. Continue Lactulose   Left leg abrasion wound - Wound care nurse consulted   Severe protein calorie malnutrition - RD consult, started on Marinol as patient reports that this has helped her appetite in the past   Multiple myeloma - Patient follows with oncology (Dr. Bertis Ruddy)  with last visit being in January of this year   Steroid myopathy - Continue Decadron.  Going home with hospice.    Anemia of chronic illness - relatively stable, overall downtrend, follow on heparin     DVT prophylaxis: heparin gtt -> eliquis Code Status: dnr/dni Family Communication: sister in law Disposition:   Status is: Inpatient Remains inpatient appropriate because: continued need for inpatient care   Consultants:  Palliative care  Procedures:  none  Antimicrobials:  Anti-infectives (From admission, onward)    None       Subjective: More awake today C/o discomfort  after being adjusted in bed Husband at bedside  Objective: Vitals:   12/08/22 1312 12/08/22 2018 12/09/22 0619 12/09/22 1304  BP: 139/74 135/69 (!) 147/75 (!) 146/105  Pulse:  85 81 80  Resp: 17 15 16 18   Temp: 98.3 F (36.8 C) 97.6 F (36.4 C) 98 F (36.7 C) 98.1 F (36.7 C)  TempSrc: Oral  Oral Oral Oral  SpO2: 99% 99% 98% 100%  Weight:      Height:        Intake/Output Summary (Last 24 hours) at 12/09/2022 1538 Last data filed at 12/09/2022 1410 Gross per 24 hour  Intake 692.78 ml  Output 300 ml  Net 392.78 ml   Filed Weights   12/05/22 1800  Weight: 59.9 kg    Examination:  General: No acute distress. Cardiovascular: RRR Lungs: unlabored Abdomen: Soft, nontender, nondistended  Neurological: Alert and oriented 3. Moves all extremities 4. Cranial nerves II through XII grossly intact. Extremities: No clubbing or cyanosis. No edema.   Data Reviewed: I have personally reviewed following labs and imaging studies  CBC: Recent Labs  Lab 12/05/22 1016 12/05/22 1041 12/06/22 0422 12/07/22 0846 12/08/22 0330 12/09/22 0839  WBC 8.4  --  6.6 5.3 4.2 5.3  NEUTROABS 2.3  --   --   --   --   --   HGB 10.6* 11.9* 8.0* 7.8* 7.4* 7.6*  HCT 33.3* 35.0* 25.9* 24.3* 22.9* 22.9*  MCV 87.9  --  90.2 88.4 87.1 86.4  PLT 260  --  219 201 233 264    Basic Metabolic Panel: Recent Labs  Lab 12/05/22 1016 12/05/22 1041 12/06/22 0422 12/07/22 0846 12/08/22 0330 12/08/22 1112  NA 131* 135 131* 132* 125* 131*  K 2.6* 2.5* 3.2* 4.0 3.5 3.6  CL 96* 96* 102 104 98 102  CO2 19*  --  19* 20* 20* 23  GLUCOSE 77 89 75 102* 116* 106*  BUN <5* <3* <5* <5* <5* 5*  CREATININE 0.75 0.60 0.65 0.70 0.67 0.65  CALCIUM 8.6*  --  7.9* 7.9* 7.5* 7.9*  MG 1.8  --  1.5*  --  1.8  --   PHOS  --   --  1.4*  --   --   --     GFR: Estimated Creatinine Clearance: 50.7 mL/min (by C-G formula based on SCr of 0.65 mg/dL).  Liver Function Tests: Recent Labs  Lab 12/05/22 1016 12/06/22 0422 12/07/22 0846  AST 28 21 19   ALT 9 7 7   ALKPHOS 62 47 51  BILITOT 2.2* 1.6* 1.6*  PROT 8.2* 6.0* 6.2*  ALBUMIN 2.8* 2.1* 2.2*    CBG: Recent Labs  Lab 12/06/22 1147 12/06/22 1602 12/06/22 2014 12/07/22 0013 12/07/22 0502  GLUCAP 82 133* 124* 114* 91     No results found for this or  any previous visit (from the past 240 hour(s)).       Radiology Studies: CT HEAD WO CONTRAST ( )  Result Date: 12/09/2022 CLINICAL DATA:  Mental status change, unknown cause. EXAM: CT HEAD WITHOUT CONTRAST TECHNIQUE: Contiguous axial images were obtained from the base of the skull through the vertex without intravenous contrast. RADIATION DOSE REDUCTION: This exam was performed according to the departmental dose-optimization program which includes automated exposure control, adjustment of the mA and/or kV according to patient size and/or use of iterative reconstruction technique. COMPARISON:  Head CT 08/12/2022. FINDINGS: Brain: No acute hemorrhage. Unchanged moderate chronic small-vessel disease. Cortical gray-white differentiation is otherwise preserved. Prominence of the ventricles  and sulci within expected range for age. No hydrocephalus or extra-axial collection. No mass effect or midline shift. Vascular: No hyperdense vessel or unexpected calcification. Skull: Prior right pterional craniotomy. Sinuses/Orbits: Moderate mucosal disease in the right maxillary sinus. Orbits are unremarkable. Other: None. IMPRESSION: 1. No acute intracranial abnormality. 2. Moderate chronic small-vessel disease. Electronically Signed   By: Orvan Falconer M.D.   On: 12/09/2022 14:22        Scheduled Meds:  dexamethasone (DECADRON) injection  2 mg Intravenous QODAY   dexamethasone (DECADRON) injection  4 mg Intravenous QODAY   dronabinol  5 mg Oral BID AC   feeding supplement  237 mL Oral BID BM   multivitamin with minerals  1 tablet Oral Daily   pantoprazole (PROTONIX) IV  40 mg Intravenous Q24H   cyanocobalamin  500 mcg Oral Daily   Continuous Infusions:  sodium chloride Stopped (12/05/22 1852)   heparin 800 Units/hr (12/09/22 0652)     LOS: 3 days    Time spent: over 30 min    Lacretia Nicks, MD [facility]   To contact the attending provider between 7A-7P or the covering provider  during after hours 7P-7A, please log into the web site [email or web address] and access using universal [place] password for that web site. If you do not have the password, please call the hospital operator.  12/09/2022, 3:38 PM

## 2022-12-09 NOTE — Care Management Important Message (Signed)
Important Message  Patient Details IM Letter given. Name: Jessica [name] MRN: [id number] Date of Birth: [birth year]   Medicare Important Message Given:  Yes     Caren Macadam 12/09/2022, 2:08 PM

## 2022-12-09 NOTE — Progress Notes (Signed)
ANTICOAGULATION CONSULT NOTE  Pharmacy Consult for stop IV heparin and transition to apixaban Indication: acute pulmonary embolus  Allergies  Allergen Reactions   Augmentin [Amoxicillin-Pot Clavulanate] Hives, Itching, Nausea And Vomiting and Rash    ++Tolerates Cefepime++ Has patient had a PCN reaction causing immediate rash, facial/tongue/throat swelling, SOB or lightheadedness with hypotension: Yes Has patient had a PCN reaction causing severe rash involving mucus membranes or skin necrosis: Yes Has patient had a PCN reaction that required hospitalization No Has patient had a PCN reaction occurring within the last 10 years: Yes If all of the above answers are "NO", then may proceed with Cephalosporin use. *okay to take other types of cillin   Albuterol Other (See Comments)    Elevated HR and BP   Codeine Nausea Only   Ibuprofen Nausea Only   Nsaids Other (See Comments)    Gi Upset   Penicillin G     Other Reaction(s): severe rash involving mucus membrane   Tolmetin Other (See Comments)    Gi Upset   Tetanus-Diphth-Acell Pertussis Rash    Received injection in ER on 8/1, developed fine rash all over body. Cellulitis to injection site on left deltoid.    Patient Measurements: Height: 5' (152.4 cm) Weight: 59.9 kg (132 lb) IBW/kg (Calculated) : 45.5  Vital Signs: Temp: 98.1 F (36.7 C) (05/02 1304) Temp Source: Oral (05/02 1304) BP: 146/105 (05/02 1304) Pulse Rate: 80 (05/02 1304)  Labs: Recent Labs    12/07/22 0846 12/07/22 1728 12/08/22 0330 12/08/22 1112 12/08/22 2224 12/09/22 0839  HGB 7.8*  --  7.4*  --   --  7.6*  HCT 24.3*  --  22.9*  --   --  22.9*  PLT 201  --  233  --   --  264  HEPARINUNFRC 0.69   < > 0.45 0.24* 0.29* 0.44  CREATININE 0.70  --  0.67 0.65  --   --    < > = values in this interval not displayed.     Estimated Creatinine Clearance: 50.7 mL/min (by C-G formula based on SCr of 0.65 mg/dL).  Assessment: Patient's a 72 y.o F with hx  multiple myeloma who presented to the ED on 12/05/22 with generalized weakness and failure to thrive. Chest CT on 12/05/22 showed bilateral PE with no evidence of RHS. On 4/28, heparin drip initiated for VTE treatment. Today, Pharmacy received consult to transition to apixaban.  Today, 12/09/2022: - hgb low but stable; Plt stable WNL - no bleeding documented   Goal of Therapy:  Heparin level 0.3-0.7 units/ml Monitor platelets by anticoagulation protocol: Yes   Plan:  At 2200, stop IV heparin and start Apixaban 10 mg po BID x 7 days followed by apixaban 5 mg po BID Pharmacy will sign off consult and continue to monitor CBC and signs/symptoms of bleeding  Thank you for allowing pharmacy to be a part of this patient's care.  Selinda Eon, PharmD, BCPS Clinical Pharmacist [place] Please utilize Amion for appropriate phone number to reach the unit pharmacist [facility]) 12/09/2022 4:09 PM

## 2022-12-09 NOTE — Discharge Instructions (Addendum)
Information on my medicine - ELIQUIS (apixaban)  This medication education was reviewed with me or my healthcare representative as part of my discharge preparation.   Why was Eliquis prescribed for you? Eliquis was prescribed to treat blood clots that may have been found in the veins of your legs (deep vein thrombosis) or in your lungs (pulmonary embolism) and to reduce the risk of them occurring again.  What do You need to know about Eliquis ? The starting dose is 10 mg (two 5 mg tablets) taken TWICE daily for the FIRST SEVEN (7) DAYS, then on 12/17/2022  the dose is reduced to ONE 5 mg tablet taken TWICE daily.  Eliquis may be taken with or without food.   Try to take the dose about the same time in the morning and in the evening. If you have difficulty swallowing the tablet whole please discuss with your pharmacist how to take the medication safely.  Take Eliquis exactly as prescribed and DO NOT stop taking Eliquis without talking to the doctor who prescribed the medication.  Stopping may increase your risk of developing a new blood clot.  Refill your prescription before you run out.  After discharge, you should have regular check-up appointments with your healthcare provider that is prescribing your Eliquis.    What do you do if you miss a dose? If a dose of ELIQUIS is not taken at the scheduled time, take it as soon as possible on the same day and twice-daily administration should be resumed. The dose should not be doubled to make up for a missed dose.  Important Safety Information A possible side effect of Eliquis is bleeding. You should call your healthcare provider right away if you experience any of the following: Bleeding from an injury or your nose that does not stop. Unusual colored urine (red or dark brown) or unusual colored stools (red or black). Unusual bruising for unknown reasons. A serious fall or if you hit your head (even if there is no bleeding).  Some  medicines may interact with Eliquis and might increase your risk of bleeding or clotting while on Eliquis. To help avoid this, consult your healthcare provider or pharmacist prior to using any new prescription or non-prescription medications, including herbals, vitamins, non-steroidal anti-inflammatory drugs (NSAIDs) and supplements.  This website has more information on Eliquis (apixaban): http://www.eliquis.com/eliquis/home

## 2022-12-09 NOTE — Progress Notes (Signed)
ANTICOAGULATION CONSULT NOTE - Follow Up Consult  Pharmacy Consult for heparin  Indication: acute pulmonary embolus  Allergies  Allergen Reactions   Augmentin [Amoxicillin-Pot Clavulanate] Hives, Itching, Nausea And Vomiting and Rash    ++Tolerates Cefepime++ Has patient had a PCN reaction causing immediate rash, facial/tongue/throat swelling, SOB or lightheadedness with hypotension: Yes Has patient had a PCN reaction causing severe rash involving mucus membranes or skin necrosis: Yes Has patient had a PCN reaction that required hospitalization No Has patient had a PCN reaction occurring within the last 10 years: Yes If all of the above answers are "NO", then may proceed with Cephalosporin use. *okay to take other types of cillin   Albuterol Other (See Comments)    Elevated HR and BP   Codeine Nausea Only   Ibuprofen Nausea Only   Nsaids Other (See Comments)    Gi Upset   Penicillin G     Other Reaction(s): severe rash involving mucus membrane   Tolmetin Other (See Comments)    Gi Upset   Tetanus-Diphth-Acell Pertussis Rash    Received injection in ER on 8/1, developed fine rash all over body. Cellulitis to injection site on left deltoid.    Patient Measurements: Height: 5' (152.4 cm) Weight: 59.9 kg (132 lb) IBW/kg (Calculated) : 45.5 Heparin Dosing Weight: 60 kg  Vital Signs: Temp: 98 F (36.7 C) (05/02 0619) Temp Source: Oral (05/02 0619) BP: 147/75 (05/02 0619) Pulse Rate: 81 (05/02 0619)  Labs: Recent Labs    12/07/22 0846 12/07/22 1728 12/08/22 0330 12/08/22 1112 12/08/22 2224 12/09/22 0839  HGB 7.8*  --  7.4*  --   --  7.6*  HCT 24.3*  --  22.9*  --   --  22.9*  PLT 201  --  233  --   --  264  HEPARINUNFRC 0.69   < > 0.45 0.24* 0.29* 0.44  CREATININE 0.70  --  0.67 0.65  --   --    < > = values in this interval not displayed.     Estimated Creatinine Clearance: 50.7 mL/min (by C-G formula based on SCr of 0.65 mg/dL).  Assessment: Patient's a 72  y.o F with hx multiple myeloma who presented to the ED on 12/05/22 with generalized weakness and failure to thrive. Chest CT on 12/05/22 showed bilateral PE with no evidence of RHS. She's currently on heparin drip for VTE treatment.   Today, 12/09/2022: - heparin level now therapeutic after rate increase to 800 units/hr - hgb low but stable; Plt stable WNL - no bleeding documented   Goal of Therapy:  Heparin level 0.3-0.7 units/ml Monitor platelets by anticoagulation protocol: Yes   Plan:  - Continue heparin drip at 800 units/hr - check 8 hr confirmatory heparin level - monitor for s/sx bleeding  - f/u for transition to chronic anticoagulation  Bernadene Person, PharmD, BCPS [phone] 12/09/2022, 9:37 AM

## 2022-12-10 DIAGNOSIS — R531 Weakness: Secondary | ICD-10-CM | POA: Diagnosis not present

## 2022-12-10 LAB — PHOSPHORUS: Phosphorus: 2.1 mg/dL — ABNORMAL LOW (ref 2.5–4.6)

## 2022-12-10 LAB — HEMOGLOBIN AND HEMATOCRIT, BLOOD
HCT: 24.9 % — ABNORMAL LOW (ref 36.0–46.0)
Hemoglobin: 7.9 g/dL — ABNORMAL LOW (ref 12.0–15.0)

## 2022-12-10 LAB — CBC
HCT: 21.9 % — ABNORMAL LOW (ref 36.0–46.0)
Hemoglobin: 7 g/dL — ABNORMAL LOW (ref 12.0–15.0)
MCH: 28.8 pg (ref 26.0–34.0)
MCHC: 32 g/dL (ref 30.0–36.0)
MCV: 90.1 fL (ref 80.0–100.0)
Platelets: 236 10*3/uL (ref 150–400)
RBC: 2.43 MIL/uL — ABNORMAL LOW (ref 3.87–5.11)
RDW: 20.7 % — ABNORMAL HIGH (ref 11.5–15.5)
WBC: 4.8 10*3/uL (ref 4.0–10.5)
nRBC: 0.6 % — ABNORMAL HIGH (ref 0.0–0.2)

## 2022-12-10 LAB — IRON AND TIBC
Iron: 65 ug/dL (ref 28–170)
Saturation Ratios: 27 % (ref 10.4–31.8)
TIBC: 244 ug/dL — ABNORMAL LOW (ref 250–450)
UIBC: 179 ug/dL

## 2022-12-10 LAB — MAGNESIUM: Magnesium: 1.8 mg/dL (ref 1.7–2.4)

## 2022-12-10 LAB — VITAMIN B12: Vitamin B-12: 934 pg/mL — ABNORMAL HIGH (ref 180–914)

## 2022-12-10 LAB — FERRITIN: Ferritin: 152 ng/mL (ref 11–307)

## 2022-12-10 LAB — FOLATE: Folate: 3.3 ng/mL — ABNORMAL LOW (ref >5.9)

## 2022-12-10 MED ORDER — APIXABAN 5 MG PO TABS
10.0000 mg | ORAL_TABLET | Freq: Two times a day (BID) | ORAL | Status: DC
  Administered 2022-12-10 – 2022-12-13 (×7): 10 mg via ORAL
  Filled 2022-12-10 (×7): qty 2

## 2022-12-10 MED ORDER — APIXABAN 5 MG PO TABS: 5.0000 mg | ORAL_TABLET | Freq: Two times a day (BID) | ORAL | Status: DC

## 2022-12-10 NOTE — Progress Notes (Signed)
Nutrition Follow-up  DOCUMENTATION CODES:   Not applicable  INTERVENTION:  - Regular diet.  - Magic cup TID with meals, each supplement provides 290 kcal and 9 grams of protein - Encourage intake as tolerated.  - Marinol to alleviate N/V and promote appetite.  - Continue antiemetics. - Multivitamin with minerals daily. - Monitor weight trends.    NUTRITION DIAGNOSIS:   Inadequate oral intake related to poor appetite, nausea, vomiting, chronic illness (multiple myeloma) as evidenced by energy intake < 75% for > or equal to 1 month. *ongoing  GOAL:   Patient will meet greater than or equal to 90% of their needs *unmet, progressing  MONITOR:   PO intake, Supplement acceptance, Weight trends  REASON FOR ASSESSMENT:   Consult Assessment of nutrition requirement/status  ASSESSMENT:   72 year old female with PMH of multiple myeloma (currently not a candidate for therapy), anemia of chronic disease, neuropathy who presented with failure to thrive, generalized weakness, poor appetite and oral intake progressive over the last several weeks.  Met with patient and husband at bedside. Patient reports her appetite is a little better and her N/V has improved a lot. She and husband grateful she is able to be on Marinol. Husband reports she has been eating something at all 3 meals, which is a major improvement from when she was eating nothing for a few weeks. She is documented to be consuming an average of 36% of meals the past 4 days. RD observed lunch tray in room of which patient ate almost all her baked potato and working on [employer]. She endorses enjoying supplements. Isn't able to eat all of them but eating around 50% of each cup. Encouraged patient to continue to consume Magic Cup for more calorie/protein dense food option. Will increase to TID.  Suspect patient still not meeting needs but intake much improved since last follow up. Will continue to monitor.    Medications reviewed  and include: Marinol, MVI, 500mg  vitamin B12  Labs reviewed:  Na 130  Phosphorus 2.1 Vitamin B12 >1000   NUTRITION - FOCUSED PHYSICAL EXAM:  Flowsheet Row Most Recent Value  Orbital Region Moderate depletion  Upper Arm Region Mild depletion  Thoracic and Lumbar Region Mild depletion  Buccal Region No depletion  Temple Region Severe depletion  Clavicle Bone Region No depletion  Clavicle and Acromion Bone Region No depletion  Scapular Bone Region Unable to assess  Dorsal Hand No depletion  Patellar Region Unable to assess  [pt reports legs hurt, did not want touched]  Anterior Thigh Region Unable to assess  Posterior Calf Region Unable to assess  Edema (RD Assessment) Mild  Hair Reviewed  Eyes Reviewed  Mouth Reviewed  Skin Reviewed  Nails Reviewed       Diet Order:   Diet Order             Diet regular Room service appropriate? Yes; Fluid consistency: Thin  Diet effective now                   EDUCATION NEEDS:  Education needs have been addressed  Skin:  Skin Assessment: Reviewed RN Assessment  Last BM:  4/28  Height:  Ht Readings from Last 1 Encounters:  12/05/22 5' (1.524 m)   Weight:  Wt Readings from Last 1 Encounters:  12/05/22 59.9 kg    BMI:  Body mass index is 25.78 kg/m.  Estimated Nutritional Needs:  Kcal:  1800-2000 kcals Protein:  70-85 grams Fluid:  >/= 1.8L  Samson Frederic RD, LDN For contact information, refer to [facility].

## 2022-12-10 NOTE — Progress Notes (Signed)
PROGRESS NOTE    Jessica [name]  ZOX:[id number] DOB: 1951/01/14 DOA: 12/05/2022 PCP: Emilio Aspen, MD  Chief Complaint  Patient presents with   Failure To Thrive   Weakness    Brief Narrative:   72 year old female with history of multiple myeloma, currently not Jessica [name] candidate for therapy due to progressive weakness and decreased performance level, in fact with oncology recommending palliative/hospice, history of steroid-induced myopathy, anemia of chronic disease, neuropathy comes into the hospital for failure to thrive, generalized weakness, poor appetite and poor oral intake progressive over the last several weeks but more so in the last 4 days. She has had recurrent falls, also Jessica [name] lot of postprandial nausea and vomiting. She has not been able to eat in the last 2 days. Imaging in the ED showed bilateral PEs without heart strain or pulmonary infarction, and CT scan of the abdomen and pelvis was without acute findings.   Assessment & Plan:   Principal Problem:   Pulmonary embolism (HCC) Active Problems:   Multiple myeloma (HCC)   Hypokalemia   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   Chronic GERD   Generalized weakness   Steroid myopathy   Failure to thrive in adult   Hypoglycemia   Hyperammonemia (HCC)   Hypoalbuminemia due to protein-calorie malnutrition (HCC)   Abrasion of left leg   Myopathy    Goals of care - appreciate palliative assistance, sounds like working on home with hospice  Acute pulmonary embolism -CT angiogram of chest with contrast was suggestive of bilateral lower lobe and right middle lobe segmental and subsegmental pulmonary emboli.  Started on heparin drip, continue for now.  No clinical bleeding, however hemoglobin drifting down - repeat labs pending. Continue to monitor on heparin, transition to eliquis 5/2   Generalized weakness, FTT -after reviewing outpatient notes this seems to be Jessica [name] problem that is somewhat chronic rather than acute.  Last saw Dr.  Bertis Ruddy in January 2024, due to decline in her performance was not Jessica [name] candidate for further treatment.  It is less likely to improve and she recommended consideration for palliative care and hospice.  Palliative care consulted, appreciate input   Slurred Speech  Encephalopathy X1 week or so? Will follow head CT -> no acute intracranial abnormality. Will hold off on additional imaging.  Head CT after 1 week without acute abnormalities.  I think MRI unlikely to meaningfully change management, discussed with husband.   Hypoglycemia - Blood glucose dropped to 56 on admission.  Initially on D5, now discontinued and encourage oral intake   Hypokalemia, hypomagnesemia -potassium normalized today   Hyperammonemia - Ammonia on admission borderline @ 36, patient with hepatic steatosis. Continue Lactulose   Left leg abrasion wound - Wound care nurse consulted   Severe protein calorie malnutrition - RD consult, started on Marinol as patient reports that this has helped her appetite in the past   Multiple myeloma - Patient follows with oncology (Dr. Bertis Ruddy)  with last visit being in January of this year   Steroid myopathy - Continue Decadron.  Going home with hospice.    Anemia of chronic illness - downtrending today, will repeat and follow, anemia labs     DVT prophylaxis: heparin gtt -> eliquis Code Status: dnr/dni Family Communication: husband at bedside Disposition:   Status is: Inpatient Remains inpatient appropriate because: continued need for inpatient care   Consultants:  Palliative care  Procedures:  none  Antimicrobials:  Anti-infectives (From admission, onward)    None  Subjective: No complaints Husband at bedside  Objective: Vitals:   12/09/22 1304 12/09/22 2116 12/10/22 0628 12/10/22 1307  BP: (!) 146/105 119/64 137/85 127/77  Pulse: 80 93 85 88  Resp: 18 15 16  (!) 22  Temp: 98.1 F (36.7 C) 98.1 F (36.7 C) 98.3 F (36.8 C) 98.1 F (36.7 C)   TempSrc: Oral Oral Oral   SpO2: 100% 99% 97% 99%  Weight:      Height:        Intake/Output Summary (Last 24 hours) at 12/10/2022 1500 Last data filed at 12/10/2022 1100 Gross per 24 hour  Intake 736.37 ml  Output 1100 ml  Net -363.63 ml   Filed Weights   12/05/22 1800  Weight: 59.9 kg    Examination:  General: No acute distress. Cardiovascular: RRR Lungs: unlabored Abdomen: Soft, nontender, nondistended Neurological: sleepy, awakens appropriately. Moves all extremities 4 with equal strength. Cranial nerves II through XII grossly intact. Extremities: No clubbing or cyanosis. No edema.   Data Reviewed: I have personally reviewed following labs and imaging studies  CBC: Recent Labs  Lab 12/05/22 1016 12/05/22 1041 12/06/22 0422 12/07/22 0846 12/08/22 0330 12/09/22 0839 12/10/22 0435  WBC 8.4  --  6.6 5.3 4.2 5.3 4.8  NEUTROABS 2.3  --   --   --   --   --   --   HGB 10.6*   < > 8.0* 7.8* 7.4* 7.6* 7.0*  HCT 33.3*   < > 25.9* 24.3* 22.9* 22.9* 21.9*  MCV 87.9  --  90.2 88.4 87.1 86.4 90.1  PLT 260  --  219 201 233 264 236   < > = values in this interval not displayed.    Basic Metabolic Panel: Recent Labs  Lab 12/05/22 1016 12/05/22 1041 12/06/22 0422 12/07/22 0846 12/08/22 0330 12/08/22 1112 12/09/22 2122 12/10/22 0435  NA 131*   < > 131* 132* 125* 131* 130*  --   K 2.6*   < > 3.2* 4.0 3.5 3.6 3.8  --   CL 96*   < > 102 104 98 102 102  --   CO2 19*  --  19* 20* 20* 23 20*  --   GLUCOSE 77   < > 75 102* 116* 106* 139*  --   BUN <5*   < > <5* <5* <5* 5* 6*  --   CREATININE 0.75   < > 0.65 0.70 0.67 0.65 0.83  --   CALCIUM 8.6*  --  7.9* 7.9* 7.5* 7.9* 7.6*  --   MG 1.8  --  1.5*  --  1.8  --   --  1.8  PHOS  --   --  1.4*  --   --   --   --  2.1*   < > = values in this interval not displayed.    GFR: Estimated Creatinine Clearance: 48.9 mL/min (by C-G formula based on SCr of 0.83 mg/dL).  Liver Function Tests: Recent Labs  Lab 12/05/22 1016  12/06/22 0422 12/07/22 0846  AST 28 21 19   ALT 9 7 7   ALKPHOS 62 47 51  BILITOT 2.2* 1.6* 1.6*  PROT 8.2* 6.0* 6.2*  ALBUMIN 2.8* 2.1* 2.2*    CBG: Recent Labs  Lab 12/06/22 1147 12/06/22 1602 12/06/22 2014 12/07/22 0013 12/07/22 0502  GLUCAP 82 133* 124* 114* 91     No results found for this or any previous visit (from the past 240 hour(s)).       Radiology Studies:  CT HEAD WO CONTRAST ( )  Result Date: 12/09/2022 CLINICAL DATA:  Mental status change, unknown cause. EXAM: CT HEAD WITHOUT CONTRAST TECHNIQUE: Contiguous axial images were obtained from the base of the skull through the vertex without intravenous contrast. RADIATION DOSE REDUCTION: This exam was performed according to the departmental dose-optimization program which includes automated exposure control, adjustment of the mA and/or kV according to patient size and/or use of iterative reconstruction technique. COMPARISON:  Head CT 08/12/2022. FINDINGS: Brain: No acute hemorrhage. Unchanged moderate chronic small-vessel disease. Cortical gray-white differentiation is otherwise preserved. Prominence of the ventricles and sulci within expected range for age. No hydrocephalus or extra-axial collection. No mass effect or midline shift. Vascular: No hyperdense vessel or unexpected calcification. Skull: Prior right pterional craniotomy. Sinuses/Orbits: Moderate mucosal disease in the right maxillary sinus. Orbits are unremarkable. Other: None. IMPRESSION: 1. No acute intracranial abnormality. 2. Moderate chronic small-vessel disease. Electronically Signed   By: Orvan Falconer M.D.   On: 12/09/2022 14:22        Scheduled Meds:  apixaban  10 mg Oral BID   Followed by   Melene Muller ON 12/17/2022] apixaban  5 mg Oral BID   dexamethasone (DECADRON) injection  2 mg Intravenous QODAY   dexamethasone (DECADRON) injection  4 mg Intravenous QODAY   dronabinol  5 mg Oral BID AC   multivitamin with minerals  1 tablet Oral Daily    pantoprazole (PROTONIX) IV  40 mg Intravenous Q24H   cyanocobalamin  500 mcg Oral Daily   Continuous Infusions:  sodium chloride Stopped (12/05/22 1852)     LOS: 4 days    Time spent: over 30 min    Lacretia Nicks, MD [facility]   To contact the attending provider between 7A-7P or the covering provider during after hours 7P-7A, please log into the web site [email or web address] and access using universal [place] password for that web site. If you do not have the password, please call the hospital operator.  12/10/2022, 3:00 PM

## 2022-12-11 ENCOUNTER — Inpatient Hospital Stay (HOSPITAL_COMMUNITY): Payer: Medicare Other

## 2022-12-11 DIAGNOSIS — R531 Weakness: Secondary | ICD-10-CM | POA: Diagnosis not present

## 2022-12-11 LAB — BASIC METABOLIC PANEL
Anion gap: 6 (ref 5–15)
Anion gap: 7 (ref 5–15)
BUN: 10 mg/dL (ref 8–23)
BUN: 13 mg/dL (ref 8–23)
CO2: 22 mmol/L (ref 22–32)
CO2: 19 mmol/L — ABNORMAL LOW (ref 22–32)
Calcium: 7.7 mg/dL — ABNORMAL LOW (ref 8.9–10.3)
Calcium: 8.1 mg/dL — ABNORMAL LOW (ref 8.9–10.3)
Chloride: 99 mmol/L (ref 98–111)
Chloride: 98 mmol/L (ref 98–111)
Creatinine, Ser: 0.73 mg/dL (ref 0.44–1.00)
Creatinine, Ser: 0.86 mg/dL (ref 0.44–1.00)
GFR, Estimated: >60 mL/min (ref >60)
GFR, Estimated: >60 mL/min (ref >60)
Glucose, Bld: 87 mg/dL (ref 70–99)
Glucose, Bld: 133 mg/dL — ABNORMAL HIGH (ref 70–99)
Potassium: 3.6 mmol/L (ref 3.5–5.1)
Potassium: 4 mmol/L (ref 3.5–5.1)
Sodium: 127 mmol/L — ABNORMAL LOW (ref 135–145)
Sodium: 124 mmol/L — ABNORMAL LOW (ref 135–145)

## 2022-12-11 LAB — CBC
HCT: 22 % — ABNORMAL LOW (ref 36.0–46.0)
Hemoglobin: 7.1 g/dL — ABNORMAL LOW (ref 12.0–15.0)
MCH: 28.9 pg (ref 26.0–34.0)
MCHC: 32.3 g/dL (ref 30.0–36.0)
MCV: 89.4 fL (ref 80.0–100.0)
Platelets: 253 10*3/uL (ref 150–400)
RBC: 2.46 MIL/uL — ABNORMAL LOW (ref 3.87–5.11)
RDW: 19.9 % — ABNORMAL HIGH (ref 11.5–15.5)
WBC: 4.9 10*3/uL (ref 4.0–10.5)
nRBC: 0.6 % — ABNORMAL HIGH (ref 0.0–0.2)

## 2022-12-11 LAB — BRAIN NATRIURETIC PEPTIDE: B Natriuretic Peptide: 65.1 pg/mL (ref 0.0–100.0)

## 2022-12-11 LAB — SODIUM, URINE, RANDOM: Sodium, Ur: 10 mmol/L

## 2022-12-11 LAB — PREPARE RBC (CROSSMATCH)

## 2022-12-11 LAB — BPAM RBC: Unit Type and Rh: 5100

## 2022-12-11 LAB — TYPE AND SCREEN: ABO/RH(D): O POS

## 2022-12-11 MED ORDER — SODIUM CHLORIDE 0.9% IV SOLUTION: Freq: Once | INTRAVENOUS | Status: DC

## 2022-12-11 MED ORDER — CHLORHEXIDINE GLUCONATE CLOTH 2 % EX PADS
6.0000 | MEDICATED_PAD | Freq: Every day | CUTANEOUS | Status: DC
  Administered 2022-12-12: 6 via TOPICAL

## 2022-12-11 MED ORDER — DEXAMETHASONE 4 MG PO TABS
4.0000 mg | ORAL_TABLET | ORAL | Status: DC
  Administered 2022-12-13: 4 mg via ORAL
  Filled 2022-12-11 (×2): qty 1

## 2022-12-11 MED ORDER — FOLIC ACID 1 MG PO TABS
1.0000 mg | ORAL_TABLET | Freq: Every day | ORAL | Status: DC
  Administered 2022-12-11 – 2022-12-12 (×2): 1 mg via ORAL
  Filled 2022-12-11 (×3): qty 1

## 2022-12-11 MED ORDER — DEXAMETHASONE 2 MG PO TABS
2.0000 mg | ORAL_TABLET | ORAL | Status: DC
  Administered 2022-12-12: 2 mg via ORAL
  Filled 2022-12-11: qty 1

## 2022-12-11 MED ORDER — PANTOPRAZOLE SODIUM 40 MG PO TBEC
40.0000 mg | DELAYED_RELEASE_TABLET | Freq: Every day | ORAL | Status: DC
  Administered 2022-12-12: 40 mg via ORAL
  Filled 2022-12-11 (×2): qty 1

## 2022-12-11 NOTE — Plan of Care (Signed)
  Problem: Pain Managment: Goal: General experience of comfort will improve Outcome: Progressing   Problem: Safety: Goal: Ability to remain free from injury will improve Outcome: Progressing   Problem: Skin Integrity: Goal: Risk for impaired skin integrity will decrease Outcome: Progressing   

## 2022-12-11 NOTE — Progress Notes (Signed)
PROGRESS NOTE    Jessica [name]  JYN:[id number] DOB: [birth year] DOA: 12/05/2022 PCP: Emilio Aspen, MD  Chief Complaint  Patient presents with   Failure To Thrive   Weakness    Brief Narrative:   72 year old female with history of multiple myeloma, currently not [name] candidate for therapy due to progressive weakness and decreased performance level, in fact with oncology recommending palliative/hospice, history of steroid-induced myopathy, anemia of chronic disease, neuropathy comes into the hospital for failure to thrive, generalized weakness, poor appetite and poor oral intake progressive over the last several weeks but more so in the last 4 days. She has had recurrent falls, also [name] lot of postprandial nausea and vomiting. She has not been able to eat in the last 2 days. Imaging in the ED showed bilateral PEs without heart strain or pulmonary infarction, and CT scan of the abdomen and pelvis was without acute findings.   Assessment & Plan:   Principal Problem:   Pulmonary embolism (HCC) Active Problems:   Multiple myeloma (HCC)   Hypokalemia   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   Chronic GERD   Generalized weakness   Steroid myopathy   Failure to thrive in adult   Hypoglycemia   Hyperammonemia (HCC)   Hypoalbuminemia due to protein-calorie malnutrition (HCC)   Abrasion of left leg   Myopathy    Goals of care - appreciate palliative assistance, sounds like working on home with hospice  Acute pulmonary embolism -CT angiogram of chest with contrast was suggestive of bilateral lower lobe and right middle lobe segmental and subsegmental pulmonary emboli.  Started on heparin drip, continue for now.  No clinical bleeding, however hemoglobin drifting down (see below)   Generalized weakness, FTT -after reviewing outpatient notes this seems to be [name] problem that is somewhat chronic rather than acute.  Last saw Dr. Bertis Ruddy in January 2024, due to decline in her performance was not [name]  candidate for further treatment.  It is less likely to improve and she recommended consideration for palliative care and hospice.  Palliative care consulted, appreciate input   Slurred Speech  Encephalopathy X1 week or so? Will follow head CT -> no acute intracranial abnormality. Will hold off on additional imaging.  Head CT after 1 week without acute abnormalities.  I think MRI unlikely to meaningfully change management, discussed with husband.   Hypoglycemia - Blood glucose dropped to 56 on admission.  Initially on D5, now discontinued and encourage oral intake   Hypokalemia, hypomagnesemia -potassium normalized today   Hyperammonemia - Ammonia on admission borderline @ 36, patient with hepatic steatosis. Continue Lactulose   Left leg abrasion wound - Wound care nurse consulted   Severe protein calorie malnutrition - RD consult, started on Marinol as patient reports that this has helped her appetite in the past   Multiple myeloma - Patient follows with oncology (Dr. Bertis Ruddy)  with last visit being in January of this year   Steroid myopathy - Continue Decadron.  Going home with hospice.    Anemia of chronic illness  Folate Deficiency - relatively stable, she notes some dizziness, will transfuse 1 unit and follow  Hyponatremia Will follow repeat     DVT prophylaxis: heparin gtt -> eliquis Code Status: dnr/dni Family Communication: husband at bedside Disposition:   Status is: Inpatient Remains inpatient appropriate because: continued need for inpatient care   Consultants:  Palliative care  Procedures:  none  Antimicrobials:  Anti-infectives (From admission, onward)    None  Subjective: C/o some LH Husband at bedside  Objective: Vitals:   12/10/22 1307 12/10/22 2032 12/11/22 0556 12/11/22 1430  BP: 127/77 139/71 128/81 (!) 143/83  Pulse: 88 83 74 87  Resp: (!) 22 16 15 15   Temp: 98.1 F (36.7 C) 98.2 F (36.8 C) 98.1 F (36.7 C) 98.2 F (36.8 C)   TempSrc:  Oral Oral   SpO2: 99% 100% 98% 100%  Weight:      Height:        Intake/Output Summary (Last 24 hours) at 12/11/2022 1801 Last data filed at 12/11/2022 1225 Gross per 24 hour  Intake 960 ml  Output 200 ml  Net 760 ml   Filed Weights   12/05/22 1800  Weight: 59.9 kg    Examination:  General: No acute distress. Cardiovascular: RRR Lungs: unlabored Abdomen: Soft, nontender, nondistended  Neurological: fatigued, moving all extremities Extremities: No clubbing or cyanosis. No edema.   Data Reviewed: I have personally reviewed following labs and imaging studies  CBC: Recent Labs  Lab 12/05/22 1016 12/05/22 1041 12/07/22 0846 12/08/22 0330 12/09/22 0839 12/10/22 0435 12/10/22 1532 12/11/22 0538  WBC 8.4   < > 5.3 4.2 5.3 4.8  --  4.9  NEUTROABS 2.3  --   --   --   --   --   --   --   HGB 10.6*   < > 7.8* 7.4* 7.6* 7.0* 7.9* 7.1*  HCT 33.3*   < > 24.3* 22.9* 22.9* 21.9* 24.9* 22.0*  MCV 87.9   < > 88.4 87.1 86.4 90.1  --  89.4  PLT 260   < > 201 233 264 236  --  253   < > = values in this interval not displayed.    Basic Metabolic Panel: Recent Labs  Lab 12/05/22 1016 12/05/22 1041 12/06/22 0422 12/07/22 0846 12/08/22 0330 12/08/22 1112 12/09/22 2122 12/10/22 0435 12/11/22 0538  NA 131*   < > 131* 132* 125* 131* 130*  --  127*  K 2.6*   < > 3.2* 4.0 3.5 3.6 3.8  --  3.6  CL 96*   < > 102 104 98 102 102  --  99  CO2 19*  --  19* 20* 20* 23 20*  --  22  GLUCOSE 77   < > 75 102* 116* 106* 139*  --  87  BUN <5*   < > <5* <5* <5* 5* 6*  --  10  CREATININE 0.75   < > 0.65 0.70 0.67 0.65 0.83  --  0.73  CALCIUM 8.6*  --  7.9* 7.9* 7.5* 7.9* 7.6*  --  7.7*  MG 1.8  --  1.5*  --  1.8  --   --  1.8  --   PHOS  --   --  1.4*  --   --   --   --  2.1*  --    < > = values in this interval not displayed.    GFR: Estimated Creatinine Clearance: 50.7 mL/min (by C-G formula based on SCr of 0.73 mg/dL).  Liver Function Tests: Recent Labs  Lab 12/05/22 1016  12/06/22 0422 12/07/22 0846  AST 28 21 19   ALT 9 7 7   ALKPHOS 62 47 51  BILITOT 2.2* 1.6* 1.6*  PROT 8.2* 6.0* 6.2*  ALBUMIN 2.8* 2.1* 2.2*    CBG: Recent Labs  Lab 12/06/22 1147 12/06/22 1602 12/06/22 2014 12/07/22 0013 12/07/22 0502  GLUCAP 82 133* 124* 114* 91  No results found for this or any previous visit (from the past 240 hour(s)).       Radiology Studies: No results found.      Scheduled Meds:  sodium chloride   Intravenous Once   apixaban  10 mg Oral BID   Followed by   Melene Muller ON 12/17/2022] apixaban  5 mg Oral BID   Chlorhexidine Gluconate Cloth  6 each Topical Daily   [START ON 12/12/2022] dexamethasone  2 mg Oral QODAY   And   [START ON 12/13/2022] dexamethasone  4 mg Oral QODAY   dronabinol  5 mg Oral BID AC   multivitamin with minerals  1 tablet Oral Daily   [START ON 12/12/2022] pantoprazole  40 mg Oral Daily   cyanocobalamin  500 mcg Oral Daily   Continuous Infusions:  sodium chloride Stopped (12/05/22 1852)     LOS: 5 days    Time spent: over 30 min    Lacretia Nicks, MD [facility]   To contact the attending provider between 7A-7P or the covering provider during after hours 7P-7A, please log into the web site [email or web address] and access using universal [place] password for that web site. If you do not have the password, please call the hospital operator.  12/11/2022, 6:01 PM

## 2022-12-12 DIAGNOSIS — R531 Weakness: Secondary | ICD-10-CM | POA: Diagnosis not present

## 2022-12-12 LAB — CBC WITH DIFFERENTIAL/PLATELET
Abs Immature Granulocytes: 0.27 10*3/uL — ABNORMAL HIGH (ref 0.00–0.07)
Basophils Absolute: 0 10*3/uL (ref 0.0–0.1)
Basophils Relative: 1 %
Eosinophils Absolute: 0 10*3/uL (ref 0.0–0.5)
Eosinophils Relative: 0 %
HCT: 31.4 % — ABNORMAL LOW (ref 36.0–46.0)
Hemoglobin: 10.3 g/dL — ABNORMAL LOW (ref 12.0–15.0)
Immature Granulocytes: 4 %
Lymphocytes Relative: 41 %
Lymphs Abs: 2.6 10*3/uL (ref 0.7–4.0)
MCH: 29.4 pg (ref 26.0–34.0)
MCHC: 32.8 g/dL (ref 30.0–36.0)
MCV: 89.7 fL (ref 80.0–100.0)
Monocytes Absolute: 0.4 10*3/uL (ref 0.1–1.0)
Monocytes Relative: 6 %
Neutro Abs: 3 10*3/uL (ref 1.7–7.7)
Neutrophils Relative %: 48 %
Platelets: 245 10*3/uL (ref 150–400)
RBC: 3.5 MIL/uL — ABNORMAL LOW (ref 3.87–5.11)
RDW: 18.3 % — ABNORMAL HIGH (ref 11.5–15.5)
WBC: 6.3 10*3/uL (ref 4.0–10.5)
nRBC: 0.3 % — ABNORMAL HIGH (ref 0.0–0.2)

## 2022-12-12 LAB — BASIC METABOLIC PANEL
Anion gap: 8 (ref 5–15)
BUN: 12 mg/dL (ref 8–23)
CO2: 20 mmol/L — ABNORMAL LOW (ref 22–32)
Calcium: 8.1 mg/dL — ABNORMAL LOW (ref 8.9–10.3)
Chloride: 101 mmol/L (ref 98–111)
Creatinine, Ser: 0.79 mg/dL (ref 0.44–1.00)
GFR, Estimated: >60 mL/min (ref >60)
Glucose, Bld: 103 mg/dL — ABNORMAL HIGH (ref 70–99)
Potassium: 3.8 mmol/L (ref 3.5–5.1)
Sodium: 129 mmol/L — ABNORMAL LOW (ref 135–145)

## 2022-12-12 LAB — TYPE AND SCREEN
Antibody Screen: NEGATIVE
Unit division: 0

## 2022-12-12 LAB — BPAM RBC
ISSUE DATE / TIME: 202405050018
Unit Type and Rh: 5100

## 2022-12-12 LAB — OSMOLALITY: Osmolality: 275 mOsm/kg (ref 275–295)

## 2022-12-12 LAB — OSMOLALITY, URINE: Osmolality, Ur: 145 mOsm/kg — ABNORMAL LOW (ref 300–900)

## 2022-12-12 MED ORDER — ALUM & MAG HYDROXIDE-SIMETH 200-200-20 MG/5ML PO SUSP
30.0000 mL | ORAL | Status: DC | PRN
  Filled 2022-12-12: qty 30

## 2022-12-12 NOTE — Progress Notes (Signed)
      Chart reviewed. I spoke with husband by phone and he wishes to proceed with referral for home hospice services and states his preference for [facility].   I also spoke with Norm Parcel, RN liaison with [facility] and confirmed that patient could continue Eliquis.   Discussed plan of care with Dr. Lowell Guitar.      Sherlean Foot, NP-C Palliative Medicine   Please call Palliative Medicine team phone with any questions [phone]. For individual providers please see AMION.   No charge

## 2022-12-12 NOTE — Progress Notes (Signed)
PROGRESS NOTE    Jessica [name]  ZOX:[id number] DOB: 1950/10/06 DOA: 12/05/2022 PCP: Emilio Aspen, MD  Chief Complaint  Patient presents with   Failure To Thrive   Weakness    Brief Narrative:   72 year old female with history of multiple myeloma, currently not Cormick Moss candidate for therapy due to progressive weakness and decreased performance level, in fact with oncology recommending palliative/hospice, history of steroid-induced myopathy, anemia of chronic disease, neuropathy comes into the hospital for failure to thrive, generalized weakness, poor appetite and poor oral intake progressive over the last several weeks but more so in the last 4 days. She has had recurrent falls, also Ovadia Lopp lot of postprandial nausea and vomiting. She has not been able to eat in the last 2 days. Imaging in the ED showed bilateral PEs without heart strain or pulmonary infarction, and CT scan of the abdomen and pelvis was without acute findings.   Assessment & Plan:   Principal Problem:   Pulmonary embolism (HCC) Active Problems:   Multiple myeloma (HCC)   Hypokalemia   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   Chronic GERD   Generalized weakness   Steroid myopathy   Failure to thrive in adult   Hypoglycemia   Hyperammonemia (HCC)   Hypoalbuminemia due to protein-calorie malnutrition (HCC)   Abrasion of left leg   Myopathy    Goals of care - appreciate palliative assistance, sounds like working on home with hospice  Acute pulmonary embolism -CT angiogram of chest with contrast was suggestive of bilateral lower lobe and right middle lobe segmental and subsegmental pulmonary emboli.  Currently on eliquis.  Follow Hb post transfusion.   Generalized weakness, FTT -after reviewing outpatient notes this seems to be Satya Bohall problem that is somewhat chronic rather than acute.  Last saw Dr. Bertis Ruddy in January 2024, due to decline in her performance was not Telvin Reinders candidate for further treatment.  It is less likely to improve and  she recommended consideration for palliative care and hospice.  Palliative care consulted, appreciate input   Slurred Speech  Encephalopathy X1 week or so? Will follow head CT -> no acute intracranial abnormality. Will hold off on additional imaging.  Head CT after 1 week without acute abnormalities.  I think MRI unlikely to meaningfully change management, discussed with husband.   Hypoglycemia - Blood glucose dropped to 56 on admission.  Initially on D5, now discontinued and encourage oral intake   Hypokalemia, hypomagnesemia -potassium normalized today   Hyperammonemia - Ammonia on admission borderline @ 36, patient with hepatic steatosis. Continue Lactulose   Left leg abrasion wound - Wound care nurse consulted   Severe protein calorie malnutrition - RD consult, started on Marinol as patient reports that this has helped her appetite in the past   Multiple myeloma - Patient follows with oncology (Dr. Bertis Ruddy)  with last visit being in January of this year   Steroid myopathy - Continue Decadron.  Going home with hospice.    Anemia of chronic illness  Folate Deficiency - S/p 1 unit pRBC Trend folate  Hyponatremia Improved with pRBC -> suspect related to intravascular volume depletion (despite trace edema on exam)     DVT prophylaxis: heparin gtt -> eliquis Code Status: dnr/dni Family Communication: husband at bedside Disposition:   Status is: Inpatient Remains inpatient appropriate because: continued need for inpatient care   Consultants:  Palliative care  Procedures:  none  Antimicrobials:  Anti-infectives (From admission, onward)    None  Subjective: Doesn't feel any different Husband at bedside  Objective: Vitals:   12/12/22 0400 12/12/22 0428 12/12/22 0500 12/12/22 0600  BP:  (!) 152/78    Pulse:  65    Resp: 11 16 12 17   Temp:  97.9 F (36.6 C)    TempSrc:  Oral    SpO2:  98%    Weight:   66 kg   Height:        Intake/Output Summary  (Last 24 hours) at 12/12/2022 1218 Last data filed at 12/12/2022 0840 Gross per 24 hour  Intake 534 ml  Output 650 ml  Net -116 ml   Filed Weights   12/05/22 1800 12/12/22 0500  Weight: 59.9 kg 66 kg    Examination:  General: No acute distress. Cardiovascular: RRR Lungs: unlabored Abdomen: Soft, nontender, nondistended  Neurological: Alert, slowed responses, poor attention. Moves all extremities 4 with equal strength. Cranial nerves II through XII grossly intact. Extremities: trace LE edema   Data Reviewed: I have personally reviewed following labs and imaging studies  CBC: Recent Labs  Lab 12/08/22 0330 12/09/22 0839 12/10/22 0435 12/10/22 1532 12/11/22 0538 12/12/22 0905  WBC 4.2 5.3 4.8  --  4.9 6.3  NEUTROABS  --   --   --   --   --  3.0  HGB 7.4* 7.6* 7.0* 7.9* 7.1* 10.3*  HCT 22.9* 22.9* 21.9* 24.9* 22.0* 31.4*  MCV 87.1 86.4 90.1  --  89.4 89.7  PLT 233 264 236  --  253 245    Basic Metabolic Panel: Recent Labs  Lab 12/06/22 0422 12/07/22 0846 12/08/22 0330 12/08/22 1112 12/09/22 2122 12/10/22 0435 12/11/22 0538 12/11/22 1828 12/12/22 0905  NA 131*   < > 125* 131* 130*  --  127* 124* 129*  K 3.2*   < > 3.5 3.6 3.8  --  3.6 4.0 3.8  CL 102   < > 98 102 102  --  99 98 101  CO2 19*   < > 20* 23 20*  --  22 19* 20*  GLUCOSE 75   < > 116* 106* 139*  --  87 133* 103*  BUN <5*   < > <5* 5* 6*  --  10 13 12   CREATININE 0.65   < > 0.67 0.65 0.83  --  0.73 0.86 0.79  CALCIUM 7.9*   < > 7.5* 7.9* 7.6*  --  7.7* 8.1* 8.1*  MG 1.5*  --  1.8  --   --  1.8  --   --   --   PHOS 1.4*  --   --   --   --  2.1*  --   --   --    < > = values in this interval not displayed.    GFR: Estimated Creatinine Clearance: 53.1 mL/min (by C-G formula based on SCr of 0.79 mg/dL).  Liver Function Tests: Recent Labs  Lab 12/06/22 0422 12/07/22 0846  AST 21 19  ALT 7 7  ALKPHOS 47 51  BILITOT 1.6* 1.6*  PROT 6.0* 6.2*  ALBUMIN 2.1* 2.2*    CBG: Recent Labs  Lab  12/06/22 1147 12/06/22 1602 12/06/22 2014 12/07/22 0013 12/07/22 0502  GLUCAP 82 133* 124* 114* 91     No results found for this or any previous visit (from the past 240 hour(s)).       Radiology Studies: DG CHEST PORT 1 VIEW  Result Date: 12/11/2022 CLINICAL DATA:  Hyponatremia, multiple myeloma EXAM: PORTABLE CHEST  1 VIEW COMPARISON:  08/12/2022 FINDINGS: Lungs are clear save for mild bibasilar linear scarring. No pneumothorax or pleural effusion. Right internal jugular chest port tip seen within the superior vena cava. Cardiac size within normal limits. Pulmonary vascularity is normal. No acute bone abnormality. IMPRESSION: 1. No active disease. Electronically Signed   By: Helyn Numbers M.D.   On: 12/11/2022 22:22        Scheduled Meds:  sodium chloride   Intravenous Once   apixaban  10 mg Oral BID   Followed by   Melene Muller ON 12/17/2022] apixaban  5 mg Oral BID   Chlorhexidine Gluconate Cloth  6 each Topical Daily   dexamethasone  2 mg Oral QODAY   And   [START ON 12/13/2022] dexamethasone  4 mg Oral QODAY   dronabinol  5 mg Oral BID AC   folic acid  1 mg Oral Daily   multivitamin with minerals  1 tablet Oral Daily   pantoprazole  40 mg Oral Daily   cyanocobalamin  500 mcg Oral Daily   Continuous Infusions:  sodium chloride Stopped (12/05/22 1852)     LOS: 6 days    Time spent: over 30 min    Lacretia Nicks, MD [facility]   To contact the attending provider between 7A-7P or the covering provider during after hours 7P-7A, please log into the web site [email or web address] and access using universal [place] password for that web site. If you do not have the password, please call the hospital operator.  12/12/2022, 12:18 PM

## 2022-12-13 DIAGNOSIS — I2699 Other pulmonary embolism without acute cor pulmonale: Secondary | ICD-10-CM | POA: Diagnosis not present

## 2022-12-13 LAB — BASIC METABOLIC PANEL
Anion gap: 7 (ref 5–15)
BUN: 13 mg/dL (ref 8–23)
CO2: 21 mmol/L — ABNORMAL LOW (ref 22–32)
Calcium: 8 mg/dL — ABNORMAL LOW (ref 8.9–10.3)
Chloride: 103 mmol/L (ref 98–111)
Creatinine, Ser: 0.74 mg/dL (ref 0.44–1.00)
GFR, Estimated: >60 mL/min (ref >60)
Glucose, Bld: 93 mg/dL (ref 70–99)
Potassium: 4.3 mmol/L (ref 3.5–5.1)
Sodium: 131 mmol/L — ABNORMAL LOW (ref 135–145)

## 2022-12-13 LAB — HEMOGLOBIN AND HEMATOCRIT, BLOOD
HCT: 31.3 % — ABNORMAL LOW (ref 36.0–46.0)
Hemoglobin: 9.8 g/dL — ABNORMAL LOW (ref 12.0–15.0)

## 2022-12-13 MED ORDER — OXYCODONE HCL 5 MG PO TABS: 5.0000 mg | ORAL_TABLET | Freq: Four times a day (QID) | ORAL | 0 refills | Status: AC | PRN

## 2022-12-13 MED ORDER — FOLIC ACID 1 MG PO TABS: 1.0000 mg | ORAL_TABLET | Freq: Every day | ORAL | 0 refills | Status: DC

## 2022-12-13 MED ORDER — PANTOPRAZOLE SODIUM 40 MG PO TBEC: 40.0000 mg | DELAYED_RELEASE_TABLET | Freq: Every day | ORAL | 0 refills | Status: DC

## 2022-12-13 MED ORDER — DRONABINOL 5 MG PO CAPS: 5.0000 mg | ORAL_CAPSULE | Freq: Two times a day (BID) | ORAL | 0 refills | Status: DC

## 2022-12-13 MED ORDER — APIXABAN 5 MG PO TABS: ORAL_TABLET | ORAL | 0 refills | Status: DC

## 2022-12-13 NOTE — TOC Progression Note (Addendum)
Transition of Care [facility]) - Progression Note    Patient Details  Name: Jessica [name] MRN: [id number] Date of Birth: March 31, 1951  Transition of Care [facility]) CM/SW Contact  Larrie Kass, LCSW Phone Number: 12/13/2022, 10:02 AM  Clinical Narrative:     Per chart  review pt is recommended for home hospice and referral was made. TOC will follow for d/c needs.         Expected Discharge Plan and Services                                               Social Determinants of Health (SDOH) Interventions SDOH Screenings   Food Insecurity: No Food Insecurity (12/05/2022)  Housing: Low Risk  (12/05/2022)  Transportation Needs: No Transportation Needs (12/05/2022)  Utilities: Not At Risk (12/05/2022)  Tobacco Use: Low Risk  (12/05/2022)    Readmission Risk Interventions     No data to display

## 2022-12-13 NOTE — TOC Transition Note (Signed)
Transition of Care [facility]) - CM/SW Discharge Note   Patient Details  Name: Jessica [name] MRN: [id number] Date of Birth: 27-Jan-1951  Transition of Care [facility]) CM/SW Contact:  Larrie Kass, LCSW Phone Number: 12/13/2022, 11:39 AM   Clinical Narrative:     CSW spoke with pt's husband to verify pt's home address. [facility] called. No additional needs TOC sign off.    Final next level of care: Home w Hospice Care Barriers to Discharge: No Barriers Identified   Patient Goals and CMS Choice      Discharge Placement                    Name of family member notified: Ramanda, Tuttle (Spouse) [phone] Patient and family notified of of transfer: 12/13/22  Discharge Plan and Services Additional resources added to the After Visit Summary for                                       Social Determinants of Health (SDOH) Interventions SDOH Screenings   Food Insecurity: No Food Insecurity (12/05/2022)  Housing: Low Risk  (12/05/2022)  Transportation Needs: No Transportation Needs (12/05/2022)  Utilities: Not At Risk (12/05/2022)  Tobacco Use: Low Risk  (12/05/2022)     Readmission Risk Interventions     No data to display

## 2022-12-13 NOTE — Discharge Summary (Signed)
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   Chronic GERD   Generalized weakness   Steroid myopathy   Failure to thrive in adult   Hypoglycemia   Hyperammonemia (HCC)   Hypoalbuminemia due to protein-calorie malnutrition (HCC)   Abrasion of left leg   Myopathy   Discharge Condition: stable  Diet recommendation: as tolerated  Filed Weights   12/05/22 1800 12/12/22 0500 12/13/22 0500  Weight: 59.9 kg 66 kg 63 kg    History of present illness:   72 year old female with history of multiple myeloma, currently not Jessica [name] candidate for therapy due to progressive weakness and decreased performance level, in fact with oncology recommending palliative/hospice, history of steroid-induced myopathy, anemia of chronic disease, neuropathy comes into the hospital for failure to thrive, generalized weakness, poor appetite and poor oral intake progressive over the last several weeks but more so in the last 4 days. She has had recurrent falls, also Keyondra Lagrand lot of postprandial nausea and vomiting. She has not been able to eat in the last 2 days. Imaging in the ED showed bilateral PEs without heart strain or pulmonary infarction, and CT scan of the abdomen and pelvis was without acute findings.  She was started on eliquis.  Palliative was consulted and plan was for home with hospice.    See below for additional details   Hospital Course:  Assessment and Plan:  Goals of care - plan for home with hospice   Acute pulmonary embolism -CT angiogram of chest with contrast was suggestive of  bilateral lower lobe and right middle lobe segmental and subsegmental pulmonary emboli.  Currently on eliquis.     Generalized weakness, FTT -after reviewing outpatient notes this seems to be Jessica [name] problem that is somewhat chronic rather than acute.  Last saw Dr. Bertis Ruddy in January 2024, due to decline in her performance was not Lucien Budney candidate for further treatment.  It is less likely to improve and she recommended consideration for palliative care and hospice.  Palliative care consulted, appreciate input   Slurred Speech  Encephalopathy X1 week or so? Will follow head CT -> no acute intracranial abnormality. Will hold off on additional imaging.  Head CT after 1 week without acute abnormalities.  I think MRI unlikely to meaningfully change management, discussed with husband.    Hypoglycemia - Blood glucose dropped to 56 on admission.  Initially on D5, now discontinued and encourage oral intake   Hypokalemia, hypomagnesemia -potassium normalized today   Hyperammonemia - unclear significance, lactulose has been d/c'd   Left leg abrasion wound - Wound care nurse consulted   Severe protein calorie malnutrition - RD consult, started on Marinol as patient reports that this has helped her appetite in the past   Multiple myeloma - Patient follows with oncology (Dr. Bertis Ruddy)  with last visit being in January of this year   Steroid myopathy - Continue Decadron.  Going home with hospice.    Anemia of chronic illness  Folate Deficiency - S/p 1 unit pRBC Folate supplementation   Hyponatremia Improved with pRBC -> suspect related to intravascular volume depletion (despite trace edema  on exam)      Procedures: none   Consultations: palliative  Discharge Exam: Vitals:   12/12/22 2015 12/13/22 0626  BP: 128/73 (!) 155/76  Pulse: 71 60  Resp: 16 20  Temp: 97.8 F (36.6 C) 98.1 F (36.7 C)  SpO2: 98% 100%   Says she just wants to rests Refuses exam today Discussed d/c plan with husband  over phone  General: NAD, sleepy Respiratory: unlabored Trace LE edema through sheets, but limited exam as she declined   Discharge Instructions   Discharge Instructions     Call MD for:  difficulty breathing, headache or visual disturbances   Complete by: As directed    Call MD for:  extreme fatigue   Complete by: As directed    Call MD for:  hives   Complete by: As directed    Call MD for:  persistant dizziness or light-headedness   Complete by: As directed    Call MD for:  persistant nausea and vomiting   Complete by: As directed    Call MD for:  redness, tenderness, or signs of infection (pain, swelling, redness, odor or green/yellow discharge around incision site)   Complete by: As directed    Call MD for:  severe uncontrolled pain   Complete by: As directed    Call MD for:  temperature >100.4   Complete by: As directed    Diet - low sodium heart healthy   Complete by: As directed    Discharge instructions   Complete by: As directed    You were admitted to the hospital for generalized weakness and failure to thrive.  You were found to have Jessica [name] pulmonary embolism.   You've been started on eliquis as Jessica [name] blood thinner for your pulmonary embolism.    You had Dimetri Armitage head CT that didn't show any acute findings.  We've arranged for home hospice for you.  They'll help with the transition to home and help prioritize her comfort.  Continue your steroids for now, follow up with Dr. Bertis Ruddy for refills and Saphyra Hutt plan for taper.   We started you on folate supplementation for deficiency.  I'll send you home with oxycodone to use as needed for pain.   Return for new, recurrent, or worsening symptoms.  Please ask your PCP to request records from this hospitalization so they know what was done and what the next steps will be.   Discharge wound care:   Complete by: As directed    Wound care to left knee full thickness skin tear (POA): Cleanse with normal saline, pat dry. Cover with folded  layers of xeroform gauze, top with dry gauze, ABD pad and secure with Kerlix roll gauze, paper tape. Change daily and as needed for drainage strike through onto exterior of dressing.   Increase activity slowly   Complete by: As directed       Allergies as of 12/13/2022       Reactions   Augmentin [amoxicillin-pot Clavulanate] Hives, Itching, Nausea And Vomiting, Rash   ++Tolerates Cefepime++ Has patient had Thai Burgueno PCN reaction causing immediate rash, facial/tongue/throat swelling, SOB or lightheadedness with hypotension: Yes Has patient had Breandan People PCN reaction causing severe rash involving mucus membranes or skin necrosis: Yes Has patient had Wyeth Hoffer PCN reaction that required hospitalization No Has patient had Jnaya Butrick PCN reaction occurring within the last 10 years: Yes If all of the above answers are "NO", then may proceed with Cephalosporin use. *okay to take other types of cillin   Albuterol  Other (See Comments)   Elevated HR and BP   Codeine Nausea Only   Ibuprofen Nausea Only   Nsaids Other (See Comments)   Gi Upset   Penicillin G    Other Reaction(s): severe rash involving mucus membrane   Tolmetin Other (See Comments)   Gi Upset   Tetanus-diphth-acell Pertussis Rash   Received injection in ER on 8/1, developed fine rash all over body. Cellulitis to injection site on left deltoid.        Medication List     STOP taking these medications    acyclovir 400 MG tablet Commonly known as: ZOVIRAX   omeprazole 20 MG capsule Commonly known as: PRILOSEC       TAKE these medications    acetaminophen 500 MG tablet Commonly known as: TYLENOL Take 500-1,000 mg by mouth every 6 (six) hours as needed for mild pain, moderate pain, fever or headache.   apixaban 5 MG Tabs tablet Commonly known as: ELIQUIS Take 2 tablets (10 mg total) by mouth 2 (two) times daily for 4 days, THEN 1 tablet (5 mg total) 2 (two) times daily. Follow up with hospice for refills. Start taking on: Dec 17, 2022    cyanocobalamin 500 MCG tablet Commonly known as: VITAMIN B12 Take 500 mcg by mouth daily.   dexamethasone 4 MG tablet Commonly known as: DECADRON Take 0.5-1 tablets (2-4 mg total) by mouth See admin instructions. Take 1 tablet alternate with 1/2 tablet every other day   dronabinol 5 MG capsule Commonly known as: MARINOL Take 1 capsule (5 mg total) by mouth 2 (two) times daily before lunch and supper.   folic acid 1 MG tablet Commonly known as: FOLVITE Take 1 tablet (1 mg total) by mouth daily. Start taking on: Dec 14, 2022   lidocaine-prilocaine cream Commonly known as: EMLA Apply to affected area once   nystatin powder Commonly known as: MYCOSTATIN/NYSTOP Apply 1 Application topically 2 (two) times daily.   ondansetron 8 MG tablet Commonly known as: ZOFRAN TAKE 1 TABLET(8 MG) BY MOUTH TWICE DAILY AS NEEDED FOR NAUSEA OR VOMITING   oxyCODONE 5 MG immediate release tablet Commonly known as: Oxy IR/ROXICODONE Take 1 tablet (5 mg total) by mouth every 6 (six) hours as needed for up to 5 days for moderate pain or severe pain.   pantoprazole 40 MG tablet Commonly known as: PROTONIX Take 1 tablet (40 mg total) by mouth daily. Start taking on: Dec 14, 2022   prochlorperazine 10 MG tablet Commonly known as: COMPAZINE Take 1 tablet (10 mg total) by mouth every 6 (six) hours as needed (Nausea or vomiting).               Discharge Care Instructions  (From admission, onward)           Start     Ordered   12/13/22 0000  Discharge wound care:       Comments: Wound care to left knee full thickness skin tear (POA): Cleanse with normal saline, pat dry. Cover with folded layers of xeroform gauze, top with dry gauze, ABD pad and secure with Kerlix roll gauze, paper tape. Change daily and as needed for drainage strike through onto exterior of dressing.   12/13/22 1215           Allergies  Allergen Reactions   Augmentin [Amoxicillin-Pot Clavulanate] Hives, Itching,  Nausea And Vomiting and Rash    ++Tolerates Cefepime++ Has patient had Rishaan Gunner PCN reaction causing immediate rash, facial/tongue/throat swelling, SOB or lightheadedness  with hypotension: Yes Has patient had Erick Murin PCN reaction causing severe rash involving mucus membranes or skin necrosis: Yes Has patient had Karia Ehresman PCN reaction that required hospitalization No Has patient had Edgel Degnan PCN reaction occurring within the last 10 years: Yes If all of the above answers are "NO", then may proceed with Cephalosporin use. *okay to take other types of cillin   Albuterol Other (See Comments)    Elevated HR and BP   Codeine Nausea Only   Ibuprofen Nausea Only   Nsaids Other (See Comments)    Gi Upset   Penicillin G     Other Reaction(s): severe rash involving mucus membrane   Tolmetin Other (See Comments)    Gi Upset   Tetanus-Diphth-Acell Pertussis Rash    Received injection in ER on 8/1, developed fine rash all over body. Cellulitis to injection site on left deltoid.      The results of significant diagnostics from this hospitalization (including imaging, microbiology, ancillary and laboratory) are listed below for reference.    Significant Diagnostic Studies: DG CHEST PORT 1 VIEW  Result Date: 12/11/2022 CLINICAL DATA:  Hyponatremia, multiple myeloma EXAM: PORTABLE CHEST 1 VIEW COMPARISON:  08/12/2022 FINDINGS: Lungs are clear save for mild bibasilar linear scarring. No pneumothorax or pleural effusion. Right internal jugular chest port tip seen within the superior vena cava. Cardiac size within normal limits. Pulmonary vascularity is normal. No acute bone abnormality. IMPRESSION: 1. No active disease. Electronically Signed   By: Helyn Numbers M.D.   On: 12/11/2022 22:22   CT HEAD WO CONTRAST ( )  Result Date: 12/09/2022 CLINICAL DATA:  Mental status change, unknown cause. EXAM: CT HEAD WITHOUT CONTRAST TECHNIQUE: Contiguous axial images were obtained from the base of the skull through the vertex without  intravenous contrast. RADIATION DOSE REDUCTION: This exam was performed according to the departmental dose-optimization program which includes automated exposure control, adjustment of the mA and/or kV according to patient size and/or use of iterative reconstruction technique. COMPARISON:  Head CT 08/12/2022. FINDINGS: Brain: No acute hemorrhage. Unchanged moderate chronic small-vessel disease. Cortical gray-white differentiation is otherwise preserved. Prominence of the ventricles and sulci within expected range for age. No hydrocephalus or extra-axial collection. No mass effect or midline shift. Vascular: No hyperdense vessel or unexpected calcification. Skull: Prior right pterional craniotomy. Sinuses/Orbits: Moderate mucosal disease in the right maxillary sinus. Orbits are unremarkable. Other: None. IMPRESSION: 1. No acute intracranial abnormality. 2. Moderate chronic small-vessel disease. Electronically Signed   By: Orvan Falconer M.D.   On: 12/09/2022 14:22   CT ABDOMEN PELVIS W CONTRAST  Result Date: 12/05/2022 CLINICAL DATA:  Pulmonary embolism (PE) suspected, high prob; Abdominal pain, acute, nonlocalized Multiple myeloma, weakness EXAM: CT ANGIOGRAPHY CHEST CT ABDOMEN AND PELVIS WITH CONTRAST TECHNIQUE: Multidetector CT imaging of the chest was performed using the standard protocol during bolus administration of intravenous contrast. Multiplanar CT image reconstructions and MIPs were obtained to evaluate the vascular anatomy. Multidetector CT imaging of the abdomen and pelvis was performed using the standard protocol during bolus administration of intravenous contrast. RADIATION DOSE REDUCTION: This exam was performed according to the departmental dose-optimization program which includes automated exposure control, adjustment of the mA and/or kV according to patient size and/or use of iterative reconstruction technique. CONTRAST:  OMNIPAQUE IOHEXOL 350 MG/ML SOLN COMPARISON:  PetCT 04/20/2019  FINDINGS: CTA CHEST FINDINGS Cardiovascular: Right chest wall Port-Tanieka Pown[place]-Cath with tip at the superior cavoatrial junction. Satisfactory opacification of the pulmonary arteries to the segmental level. Right lower lobe and right  middle lobe filling defect at the segmental and subsegmental level. Left lower lobe lung filling defect at the segmental and subsegmental levels. Left ventricular hypertrophy. No significant pericardial effusion. The thoracic aorta is normal in caliber. Mild atherosclerotic plaque of the thoracic aorta. No coronary artery calcifications. Mediastinum/Nodes: No enlarged mediastinal, hilar, or axillary lymph nodes. Thyroid gland, trachea, and esophagus demonstrate no significant findings. Tiny hiatal hernia. Lungs/Pleura: Bilateral lower lobe subsegmental atelectasis. No focal consolidation. No pulmonary nodule. No pulmonary mass. No pleural effusion. No pneumothorax. Musculoskeletal: No chest wall abnormality. No suspicious lytic or blastic osseous lesions. No acute displaced fracture. Review of the MIP images confirms the above findings. CT ABDOMEN and PELVIS FINDINGS Hepatobiliary: The hepatic parenchyma is diffusely hypodense compared to the splenic parenchyma consistent with fatty infiltration. No focal liver abnormality. No gallstones, gallbladder wall thickening, or pericholecystic fluid. No biliary dilatation. Pancreas: No focal lesion. Normal pancreatic contour. No surrounding inflammatory changes. No main pancreatic ductal dilatation. Spleen: Normal in size without focal abnormality. Adrenals/Urinary Tract: No adrenal nodule bilaterally. Bilateral kidneys enhance symmetrically. Bilateral fluid density lesions likely represent simple renal cysts. Simple renal cysts, in the absence of clinically indicated signs/symptoms, require no independent follow-up. Subcentimeter hypodensity too small to characterize-no further follow-up indicated. No hydronephrosis. No hydroureter. The urinary bladder  is unremarkable. Stomach/Bowel: Stomach is within normal limits. No evidence of bowel wall thickening or dilatation. Appendix appears normal. Vascular/Lymphatic: No abdominal aorta or iliac aneurysm. No abdominal, pelvic, or inguinal lymphadenopathy. Reproductive: Uterus and bilateral adnexa are unremarkable. Other: No intraperitoneal free fluid. No intraperitoneal free gas. No organized fluid collection. Musculoskeletal: No abdominal wall hernia or abnormality. No suspicious lytic or blastic osseous lesions. No acute displaced fracture. Review of the MIP images confirms the above findings. IMPRESSION: 1. Bilateral lower lobe and right middle lobe segmental and subsegmental pulmonary emboli. No associated heart strain or pulmonary infarction. 2. Left ventricular hypertrophy. 3. Tiny hiatal hernia. 4. Hepatic steatosis. 5.  Aortic Atherosclerosis (ICD10-I70.0). 6. No osseous lesions to suggest multiple myeloma recurrence or metastasis. These results were called by telephone at the time of interpretation on 12/05/2022 at 5:06 pm to provider Dr.  Nation, who verbally acknowledged these results. Electronically Signed   By: Tish Frederickson M.D.   On: 12/05/2022 17:11   CT Angio Chest PE W and/or Wo Contrast  Result Date: 12/05/2022 CLINICAL DATA:  Pulmonary embolism (PE) suspected, high prob; Abdominal pain, acute, nonlocalized Multiple myeloma, weakness EXAM: CT ANGIOGRAPHY CHEST CT ABDOMEN AND PELVIS WITH CONTRAST TECHNIQUE: Multidetector CT imaging of the chest was performed using the standard protocol during bolus administration of intravenous contrast. Multiplanar CT image reconstructions and MIPs were obtained to evaluate the vascular anatomy. Multidetector CT imaging of the abdomen and pelvis was performed using the standard protocol during bolus administration of intravenous contrast. RADIATION DOSE REDUCTION: This exam was performed according to the departmental dose-optimization program which includes  automated exposure control, adjustment of the mA and/or kV according to patient size and/or use of iterative reconstruction technique. CONTRAST:  OMNIPAQUE IOHEXOL 350 MG/ML SOLN COMPARISON:  PetCT 04/20/2019 FINDINGS: CTA CHEST FINDINGS Cardiovascular: Right chest wall Port-Parys Elenbaas-Cath with tip at the superior cavoatrial junction. Satisfactory opacification of the pulmonary arteries to the segmental level. Right lower lobe and right middle lobe filling defect at the segmental and subsegmental level. Left lower lobe lung filling defect at the segmental and subsegmental levels. Left ventricular hypertrophy. No significant pericardial effusion. The thoracic aorta is normal in caliber. Mild atherosclerotic plaque of the  thoracic aorta. No coronary artery calcifications. Mediastinum/Nodes: No enlarged mediastinal, hilar, or axillary lymph nodes. Thyroid gland, trachea, and esophagus demonstrate no significant findings. Tiny hiatal hernia. Lungs/Pleura: Bilateral lower lobe subsegmental atelectasis. No focal consolidation. No pulmonary nodule. No pulmonary mass. No pleural effusion. No pneumothorax. Musculoskeletal: No chest wall abnormality. No suspicious lytic or blastic osseous lesions. No acute displaced fracture. Review of the MIP images confirms the above findings. CT ABDOMEN and PELVIS FINDINGS Hepatobiliary: The hepatic parenchyma is diffusely hypodense compared to the splenic parenchyma consistent with fatty infiltration. No focal liver abnormality. No gallstones, gallbladder wall thickening, or pericholecystic fluid. No biliary dilatation. Pancreas: No focal lesion. Normal pancreatic contour. No surrounding inflammatory changes. No main pancreatic ductal dilatation. Spleen: Normal in size without focal abnormality. Adrenals/Urinary Tract: No adrenal nodule bilaterally. Bilateral kidneys enhance symmetrically. Bilateral fluid density lesions likely represent simple renal cysts. Simple renal cysts, in the  absence of clinically indicated signs/symptoms, require no independent follow-up. Subcentimeter hypodensity too small to characterize-no further follow-up indicated. No hydronephrosis. No hydroureter. The urinary bladder is unremarkable. Stomach/Bowel: Stomach is within normal limits. No evidence of bowel wall thickening or dilatation. Appendix appears normal. Vascular/Lymphatic: No abdominal aorta or iliac aneurysm. No abdominal, pelvic, or inguinal lymphadenopathy. Reproductive: Uterus and bilateral adnexa are unremarkable. Other: No intraperitoneal free fluid. No intraperitoneal free gas. No organized fluid collection. Musculoskeletal: No abdominal wall hernia or abnormality. No suspicious lytic or blastic osseous lesions. No acute displaced fracture. Review of the MIP images confirms the above findings. IMPRESSION: 1. Bilateral lower lobe and right middle lobe segmental and subsegmental pulmonary emboli. No associated heart strain or pulmonary infarction. 2. Left ventricular hypertrophy. 3. Tiny hiatal hernia. 4. Hepatic steatosis. 5.  Aortic Atherosclerosis (ICD10-I70.0). 6. No osseous lesions to suggest multiple myeloma recurrence or metastasis. These results were called by telephone at the time of interpretation on 12/05/2022 at 5:06 pm to provider Dr. Beaver Nation, who verbally acknowledged these results. Electronically Signed   By: Tish Frederickson M.D.   On: 12/05/2022 17:11    Microbiology: No results found for this or any previous visit (from the past 240 hour(s)).   Labs: Basic Metabolic Panel: Recent Labs  Lab 12/08/22 0330 12/08/22 1112 12/09/22 2122 12/10/22 0435 12/11/22 0538 12/11/22 1828 12/12/22 0905 12/13/22 0419  NA 125*   < > 130*  --  127* 124* 129* 131*  K 3.5   < > 3.8  --  3.6 4.0 3.8 4.3  CL 98   < > 102  --  99 98 101 103  CO2 20*   < > 20*  --  22 19* 20* 21*  GLUCOSE 116*   < > 139*  --  87 133* 103* 93  BUN <5*   < > 6*  --  10 13 12 13   CREATININE 0.67   < > 0.83   --  0.73 0.86 0.79 0.74  CALCIUM 7.5*   < > 7.6*  --  7.7* 8.1* 8.1* 8.0*  MG 1.8  --   --  1.8  --   --   --   --   PHOS  --   --   --  2.1*  --   --   --   --    < > = values in this interval not displayed.   Liver Function Tests: Recent Labs  Lab 12/07/22 0846  AST 19  ALT 7  ALKPHOS 51  BILITOT 1.6*  PROT 6.2*  ALBUMIN 2.2*  No results for input(s): "LIPASE", "AMYLASE" in the last 168 hours. Recent Labs  Lab 12/09/22 0839  AMMONIA 26   CBC: Recent Labs  Lab 12/08/22 0330 12/09/22 0839 12/10/22 0435 12/10/22 1532 12/11/22 0538 12/12/22 0905 12/13/22 0419  WBC 4.2 5.3 4.8  --  4.9 6.3  --   NEUTROABS  --   --   --   --   --  3.0  --   HGB 7.4* 7.6* 7.0* 7.9* 7.1* 10.3* 9.8*  HCT 22.9* 22.9* 21.9* 24.9* 22.0* 31.4* 31.3*  MCV 87.1 86.4 90.1  --  89.4 89.7  --   PLT 233 264 236  --  253 245  --    Cardiac Enzymes: No results for input(s): "CKTOTAL", "CKMB", "CKMBINDEX", "TROPONINI" in the last 168 hours. BNP: BNP (last 3 results) Recent Labs    12/11/22 1827  BNP 65.1    ProBNP (last 3 results) No results for input(s): "PROBNP" in the last 8760 hours.  CBG: Recent Labs  Lab 12/06/22 1602 12/06/22 2014 12/07/22 0013 12/07/22 0502  GLUCAP 133* 124* 114* 91       Signed:  Lacretia Nicks MD.  [facility] 12/13/2022, 12:15 PM

## 2022-12-14 LAB — TYPE AND SCREEN

## 2022-12-14 LAB — BPAM RBC: Blood Product Expiration Date: 202406072359

## 2023-01-08 DEATH — deceased
